# Patient Record
Sex: Male | Born: 1945
Health system: Southern US, Community
[De-identification: ages and names within clinical notes are randomized; demographics above are authoritative.]

## PROBLEM LIST (undated history)

## (undated) DIAGNOSIS — D638 Anemia in other chronic diseases classified elsewhere: Secondary | ICD-10-CM

## (undated) DIAGNOSIS — I10 Essential (primary) hypertension: Secondary | ICD-10-CM

## (undated) DIAGNOSIS — K5190 Ulcerative colitis, unspecified, without complications: Secondary | ICD-10-CM

## (undated) DIAGNOSIS — Z87442 Personal history of urinary calculi: Secondary | ICD-10-CM

## (undated) DIAGNOSIS — I8290 Acute embolism and thrombosis of unspecified vein: Secondary | ICD-10-CM

## (undated) DIAGNOSIS — C189 Malignant neoplasm of colon, unspecified: Secondary | ICD-10-CM

## (undated) DIAGNOSIS — I739 Peripheral vascular disease, unspecified: Secondary | ICD-10-CM

## (undated) HISTORY — PX: TONSILLECTOMY: SUR1361

## (undated) HISTORY — PX: HERNIA REPAIR: SHX51

## (undated) HISTORY — PX: VASECTOMY: SHX75

## (undated) HISTORY — DX: Peripheral vascular disease, unspecified: I73.9

## (undated) HISTORY — PX: UMBILICAL HERNIA REPAIR: SHX196

---

## 2013-01-05 DIAGNOSIS — D638 Anemia in other chronic diseases classified elsewhere: Secondary | ICD-10-CM

## 2013-01-05 DIAGNOSIS — I8290 Acute embolism and thrombosis of unspecified vein: Secondary | ICD-10-CM

## 2013-01-05 HISTORY — DX: Anemia in other chronic diseases classified elsewhere: D63.8

## 2013-01-05 HISTORY — DX: Acute embolism and thrombosis of unspecified vein: I82.90

## 2013-02-05 HISTORY — PX: COLON SURGERY: SHX602

## 2013-03-05 DIAGNOSIS — C189 Malignant neoplasm of colon, unspecified: Secondary | ICD-10-CM

## 2013-03-05 HISTORY — DX: Malignant neoplasm of colon, unspecified: C18.9

## 2013-03-17 ENCOUNTER — Other Ambulatory Visit: Payer: Self-pay | Admitting: Gastroenterology

## 2013-03-17 DIAGNOSIS — R195 Other fecal abnormalities: Secondary | ICD-10-CM

## 2013-03-17 DIAGNOSIS — R197 Diarrhea, unspecified: Secondary | ICD-10-CM

## 2013-03-17 DIAGNOSIS — R933 Abnormal findings on diagnostic imaging of other parts of digestive tract: Secondary | ICD-10-CM

## 2013-03-20 ENCOUNTER — Other Ambulatory Visit: Payer: Self-pay | Admitting: Gastroenterology

## 2013-03-20 ENCOUNTER — Ambulatory Visit
Admission: RE | Admit: 2013-03-20 | Discharge: 2013-03-20 | Disposition: A | Discharge status: Home / Self Care | Payer: BC Managed Care – PPO | Source: Ambulatory Visit | Attending: Gastroenterology | Admitting: Gastroenterology

## 2013-03-20 ENCOUNTER — Inpatient Hospital Stay
Admission: RE | Admit: 2013-03-20 | Discharge: 2013-03-20 | Disposition: A | Discharge status: Home / Self Care | Payer: Self-pay | Source: Ambulatory Visit | Attending: Gastroenterology | Admitting: Gastroenterology

## 2013-03-20 DIAGNOSIS — R197 Diarrhea, unspecified: Secondary | ICD-10-CM

## 2013-03-20 DIAGNOSIS — R933 Abnormal findings on diagnostic imaging of other parts of digestive tract: Secondary | ICD-10-CM

## 2013-03-20 MED ORDER — IOHEXOL 300 MG/ML  SOLN
100.0000 mL | Freq: Once | INTRAMUSCULAR | Status: AC | PRN
  Administered 2013-03-20: 100 mL via INTRAVENOUS

## 2013-03-23 ENCOUNTER — Other Ambulatory Visit (INDEPENDENT_AMBULATORY_CARE_PROVIDER_SITE_OTHER): Payer: Self-pay | Admitting: Surgery

## 2013-03-23 ENCOUNTER — Ambulatory Visit (INDEPENDENT_AMBULATORY_CARE_PROVIDER_SITE_OTHER): Payer: BC Managed Care – PPO | Admitting: Surgery

## 2013-03-23 ENCOUNTER — Encounter (INDEPENDENT_AMBULATORY_CARE_PROVIDER_SITE_OTHER): Payer: Self-pay | Admitting: Surgery

## 2013-03-23 ENCOUNTER — Ambulatory Visit (INDEPENDENT_AMBULATORY_CARE_PROVIDER_SITE_OTHER): Payer: Self-pay | Admitting: General Surgery

## 2013-03-23 VITALS — BP 142/82 | HR 75 | Temp 98.4°F | Resp 16 | Ht 70.0 in | Wt 175.0 lb

## 2013-03-23 DIAGNOSIS — C189 Malignant neoplasm of colon, unspecified: Secondary | ICD-10-CM

## 2013-03-23 DIAGNOSIS — C184 Malignant neoplasm of transverse colon: Secondary | ICD-10-CM | POA: Insufficient documentation

## 2013-03-23 DIAGNOSIS — K5190 Ulcerative colitis, unspecified, without complications: Secondary | ICD-10-CM | POA: Insufficient documentation

## 2013-03-23 DIAGNOSIS — K429 Umbilical hernia without obstruction or gangrene: Secondary | ICD-10-CM | POA: Insufficient documentation

## 2013-03-23 DIAGNOSIS — K4090 Unilateral inguinal hernia, without obstruction or gangrene, not specified as recurrent: Secondary | ICD-10-CM

## 2013-03-23 NOTE — Progress Notes (Signed)
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 male referred by Dr. Meriel Pica has a history of diarrhea going back 20 years at which time Dr. Lenda Kelp performed and a flex sig and diagnosed ulcerative colitis. Recently he's had diarrhea and was found to have heme positive stools by Dr. Florina Ou. This led to a colonoscopy by Dr. Collene Mares which shows a stricture at 50 cm and some evidence of colitis. A CT scan was performed which showed an exophytic colon mass at the junction of the transverse and descending colon which corresponds to the area biopsied by Dr. Collene Mares which showed adenocarcinoma. There were gallstones noted  Bi lateral kidney stones and a small right inguinal hernia also noted.  His wife works at an equal end[employer] and his daughter-in-law came with him. I explained the procedure to them in detail outlining a laparoscopically assisted transverse colectomy with probable descending colectomy and right colon to sigmoid colon anastomosis. They're aware that there may need to be a temporary colostomy. We'll certainly try to avoid.  Past Medical History  Diagnosis Date  . Diarrhea     Past Surgical History  Procedure Laterality Date  . Hernia repair      Current Outpatient Prescriptions  Medication Sig Dispense Refill  . diphenoxylate-atropine (LOMOTIL) 2.5-0.025 MG per tablet       . hydrochlorothiazide (MICROZIDE) 12.5 MG capsule       . PEG 3350-KCl-NaBcb-NaCl-NaSulf (PEG-3350/ELECTROLYTES) 236 G SOLR       . VIAGRA 50 MG tablet        No current facility-administered medications for this visit.   Review of patient's allergies indicates no known allergies. Family History  Problem Relation Age of Onset  . Heart disease Mother    Social History:   reports that he has never smoked. He does not have any smokeless tobacco history on file. He reports that he drinks alcohol. He reports that he  does not use illicit drugs.   REVIEW OF SYSTEMS - PERTINENT POSITIVES ONLY: Never a smoker. No history of DVT  Physical Exam:   Blood pressure 142/82, pulse 75, temperature 98.4 F (36.9 C), temperature source Oral, resp. rate 16, height 5' 10"  (1.778 m), weight 175 lb (79.379 kg). Body mass index is 25.11 kg/(m^2).  Gen:  WDWN white male NAD  Neurological: Alert and oriented to person, place, and time. Motor and sensory function is grossly intact  Head: Normocephalic and atraumatic.  Eyes: Conjunctivae are normal. Pupils are equal, round, and reactive to light. No scleral icterus.  Neck: Normal range of motion. Neck supple. No tracheal deviation or thyromegaly present.  Cardiovascular:  SR without murmurs or gallops.  No carotid bruits Respiratory: Effort normal.  No respiratory distress. No chest wall tenderness. Breath sounds normal.  No wheezes, rales or rhonchi.  Abdomen:  Recurrent umbilical hernia (repaired by Ronnie Derby in the 90s) GU: Musculoskeletal: Normal range of motion. Extremities are nontender. No cyanosis, edema or clubbing noted Lymphadenopathy: No cervical, preauricular, postauricular or axillary adenopathy is present Skin: Skin is warm and dry. No rash noted. No diaphoresis. No erythema. No pallor. Pscyh: Normal mood and affect. Behavior is normal. Judgment and thought content normal.   LABORATORY RESULTS: No results found for this or any previous visit (from the past 48 hour(s)).  RADIOLOGY RESULTS: No results found.  Problem List: Patient Active Problem List   Diagnosis Date Noted  .  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 Right inguinal hernia 03/23/2013  . Umbilical hernia 40/35/2481  . Ulcerative colitis, unspecified 03/23/2013    Assessment & Plan: Cancer of the splenic flexure. Plan laparoscopically assisted left hemicolectomy.    Matt B. Hassell Done, MD, [facility] [phone] beeper [phone]  03/23/2013 12:07  PM

## 2013-03-23 NOTE — Patient Instructions (Signed)
Laparoscopic Colectomy, Care After Refer to this sheet in the next few weeks. These instructions provide you with information on caring for yourself after your procedure. Your health care provider may also give you more specific instructions. Your treatment has been planned according to current medical practices, but problems sometimes occur. Call your health care provider if you have any problems or questions after your procedure. WHAT TO EXPECT AFTER THE PROCEDURE After your procedure, it is typical to have the following:  Pain in your abdomen, especially at the incision sites. You will be given pain medicine to control the pain.  Tiredness. This is a normal part of the recovery process. Your energy level will return to normal over the next several weeks.  Constipation. You may be given stool softeners to prevent this. HOME CARE INSTRUCTIONS   Only take over-the-counter or prescription medicines as directed by your health care provider.  Ask your health care provider whether you may take a shower when you go home.  Remove or change any bandages (dressings) as directed.  You may resume a normal diet and activities as directed. Eat plenty of fruits and vegetables to help prevent constipation.  Drink enough fluids to keep your urine clear or pale yellow. This also helps prevent constipation.  Take rest breaks during the day as needed.  Avoid lifting anything heavier than 25 pounds (11.3 kg) or driving for 4 weeks or until your health care provider says it is okay.  Follow up with your health care provider as directed. Ask your health care provider when to make an appointment to get your stitches or staples removed. SEEK MEDICAL CARE IF:   You have increased bleeding from the incision areas.  You have redness, swelling, or increasing pain in the wounds.  You see pus coming from a wound.  You have a fever.  You notice a foul smell coming from the wound or dressing.  Your wound is  breaking open (edges not staying together) after sutures or staples have been removed. SEEK IMMEDIATE MEDICAL CARE IF:  You develop a rash.  You have chest pain or difficulty breathing.  You have pain or swelling in your legs.  You have lightheadedness or feel faint.  Your abdomen becomes larger (distended).  You have nausea or vomiting.  You have blood in your stools. Document Released: 07/11/2004 Document Revised: 10/12/2012 Document Reviewed: 08/03/2012 [facility] Patient Information 2014 [place], [place].

## 2013-03-24 ENCOUNTER — Telehealth (INDEPENDENT_AMBULATORY_CARE_PROVIDER_SITE_OTHER): Payer: Self-pay | Admitting: General Surgery

## 2013-03-24 ENCOUNTER — Encounter (HOSPITAL_COMMUNITY): Payer: Self-pay | Admitting: Pharmacy Technician

## 2013-03-24 NOTE — Telephone Encounter (Signed)
Patient called in explaining that he is scheduled to start his preop colon cleanse on Monday 3/23 as well as his preadmission appointment at the hospital at 7:30.  He explained that there is no way he could do both of those at the same time.  I informed him that I would suggest he start the prep as soon as he gets home from his pre-admission appointment and that way it would only be putting him behind by about 30 minutes according to the bowel prep instructions.  The patient agreed with this plan on action and explained he would call back if he had any other questions.

## 2013-03-27 ENCOUNTER — Encounter (HOSPITAL_COMMUNITY): Payer: Self-pay

## 2013-03-27 ENCOUNTER — Ambulatory Visit (HOSPITAL_COMMUNITY)
Admission: RE | Admit: 2013-03-27 | Discharge: 2013-03-27 | Disposition: A | Discharge status: Home / Self Care | Payer: BC Managed Care – PPO | Source: Ambulatory Visit | Attending: Anesthesiology | Admitting: Anesthesiology

## 2013-03-27 ENCOUNTER — Telehealth (INDEPENDENT_AMBULATORY_CARE_PROVIDER_SITE_OTHER): Payer: Self-pay

## 2013-03-27 ENCOUNTER — Encounter (HOSPITAL_COMMUNITY)
Admission: RE | Admit: 2013-03-27 | Discharge: 2013-03-27 | Disposition: A | Discharge status: Home / Self Care | Payer: BC Managed Care – PPO | Source: Ambulatory Visit | Attending: Surgery | Admitting: Surgery

## 2013-03-27 DIAGNOSIS — C189 Malignant neoplasm of colon, unspecified: Secondary | ICD-10-CM

## 2013-03-27 DIAGNOSIS — Z01810 Encounter for preprocedural cardiovascular examination: Secondary | ICD-10-CM | POA: Insufficient documentation

## 2013-03-27 DIAGNOSIS — Z01812 Encounter for preprocedural laboratory examination: Secondary | ICD-10-CM

## 2013-03-27 DIAGNOSIS — Z01818 Encounter for other preprocedural examination: Secondary | ICD-10-CM | POA: Insufficient documentation

## 2013-03-27 HISTORY — DX: Personal history of urinary calculi: Z87.442

## 2013-03-27 HISTORY — DX: Ulcerative colitis, unspecified, without complications: K51.90

## 2013-03-27 HISTORY — DX: Essential (primary) hypertension: I10

## 2013-03-27 LAB — CBC WITH DIFFERENTIAL/PLATELET
Basophils Absolute: 0.2 10*3/uL — ABNORMAL HIGH (ref 0.0–0.1)
Basophils Relative: 2 % — ABNORMAL HIGH (ref 0–1)
EOS ABS: 0.2 10*3/uL (ref 0.0–0.7)
Eosinophils Relative: 2 % (ref 0–5)
HCT: 31.1 % — ABNORMAL LOW (ref 39.0–52.0)
Hemoglobin: 9.3 g/dL — ABNORMAL LOW (ref 13.0–17.0)
Lymphocytes Relative: 11 % — ABNORMAL LOW (ref 12–46)
Lymphs Abs: 1.1 10*3/uL (ref 0.7–4.0)
MCH: 19 pg — AB (ref 26.0–34.0)
MCHC: 29.9 g/dL — AB (ref 30.0–36.0)
MCV: 63.5 fL — ABNORMAL LOW (ref 78.0–100.0)
MONO ABS: 0.9 10*3/uL (ref 0.1–1.0)
Monocytes Relative: 9 % (ref 3–12)
Neutro Abs: 7.6 10*3/uL (ref 1.7–7.7)
Neutrophils Relative %: 76 % (ref 43–77)
PLATELETS: 579 10*3/uL — AB (ref 150–400)
RBC: 4.9 MIL/uL (ref 4.22–5.81)
RDW: 16.9 % — ABNORMAL HIGH (ref 11.5–15.5)
WBC: 10 10*3/uL (ref 4.0–10.5)

## 2013-03-27 LAB — COMPREHENSIVE METABOLIC PANEL
ALBUMIN: 3.5 g/dL (ref 3.5–5.2)
ALT: 13 U/L (ref 0–53)
AST: 17 U/L (ref 0–37)
Alkaline Phosphatase: 85 U/L (ref 39–117)
BUN: 12 mg/dL (ref 6–23)
CALCIUM: 9.4 mg/dL (ref 8.4–10.5)
CO2: 27 mEq/L (ref 19–32)
CREATININE: 1.1 mg/dL (ref 0.50–1.35)
Chloride: 95 mEq/L — ABNORMAL LOW (ref 96–112)
GFR calc Af Amer: 78 mL/min — ABNORMAL LOW (ref >90)
GFR calc non Af Amer: 67 mL/min — ABNORMAL LOW (ref >90)
Glucose, Bld: 104 mg/dL — ABNORMAL HIGH (ref 70–99)
Potassium: 3.8 mEq/L (ref 3.7–5.3)
Sodium: 136 mEq/L — ABNORMAL LOW (ref 137–147)
Total Bilirubin: 0.3 mg/dL (ref 0.3–1.2)
Total Protein: 7.8 g/dL (ref 6.0–8.3)

## 2013-03-27 LAB — ABO/RH: ABO/RH(D): B NEG

## 2013-03-27 LAB — CEA: CEA: 4.7 ng/mL (ref 0.0–5.0)

## 2013-03-27 NOTE — Pre-Procedure Instructions (Addendum)
03-27-13 EKG / CXR done today. 03-27-13 1215 Note per Epic to Dr. Hassell Done- labs viewable in Epic-please note. W. Floy Sabina

## 2013-03-27 NOTE — Progress Notes (Signed)
[id number] Labs viewable in Gem, please note CBC/d, CMP, CEA pending.

## 2013-03-27 NOTE — Patient Instructions (Addendum)
20 SIMS LADAY  03/27/2013   Your procedure is scheduled on: 3-24  -2015  Report to [facility] at        0700 AM.  Call this number if you have problems the morning of surgery: [phone]  Or Presurgical Testing [phone](Saraiyah Hemminger) For Living Will and/or Health Care Power Attorney Forms: please provide copy for your medical record,may bring AM of surgery(Forms should be already notarized -we do not provide this service).  Remember: Follow any bowel prep instructions per MD office.    Do not eat food:After Midnight.    Take these medicines the morning of surgery with A SIP OF WATER: none   Do not wear jewelry, make-up or nail polish.  Do not wear lotions, powders, or perfumes. You may wear deodorant.  Do not shave 48 hours(2 days) prior to first CHG shower(legs and under arms).(Shaving face and neck okay.)  Do not bring valuables to the hospital.(Hospital is not responsible for lost valuables).  Contacts, dentures or removable bridgework, body piercing, hair pins may not be worn into surgery.  Leave suitcase in the car. After surgery it may be brought to your room.  For patients admitted to the hospital, checkout time is 11:00 AM the day of discharge.(Restricted visitors-Any Persons displaying flu-like symptoms or illness).    Patients discharged the day of surgery will not be allowed to drive home. Must have responsible person with you x 24 hours once discharged.  Name and phone number of your driver: Dawsyn Ramsaran -spouse [phone] cell  Special Instructions: CHG(Chlorhedine 4%-"Hibiclens","Betasept","Aplicare") Shower Use Special Wash: see special instructions.(avoid face and genitals)   Please read over the following fact sheets that you were given: Blood Transfusion fact sheet, Incentive Spirometry Instruction.  Remember : Type/Screen "Blue armbands" - may not be removed once applied(would result in being retested AM of surgery, if removed).  Failure to  follow these instructions may result in Cancellation of your surgery.   Patient signature_______________________________________________________

## 2013-03-27 NOTE — Telephone Encounter (Signed)
Patient states he can not get the antibiotics down "they get hung in my throat". Advised patient to crush the ABT and put in 4oz fluids . He was to advise the pre op nurse in the am what he was able to consume. Patient verbalized understands

## 2013-03-28 ENCOUNTER — Encounter (HOSPITAL_COMMUNITY): Payer: Self-pay | Admitting: *Deleted

## 2013-03-28 ENCOUNTER — Inpatient Hospital Stay (HOSPITAL_COMMUNITY): Payer: BC Managed Care – PPO | Admitting: Anesthesiology

## 2013-03-28 ENCOUNTER — Encounter (HOSPITAL_COMMUNITY)
Admission: RE | Disposition: A | Discharge status: Home / Self Care | Payer: Self-pay | Source: Ambulatory Visit | Attending: Surgery

## 2013-03-28 ENCOUNTER — Inpatient Hospital Stay (HOSPITAL_COMMUNITY)
Admission: RE | Admit: 2013-03-28 | Discharge: 2013-04-04 | DRG: 330 | Disposition: A | Discharge status: Home / Self Care | Payer: BC Managed Care – PPO | Source: Ambulatory Visit | Attending: Surgery | Admitting: Surgery

## 2013-03-28 ENCOUNTER — Encounter (HOSPITAL_COMMUNITY): Payer: BC Managed Care – PPO | Admitting: Anesthesiology

## 2013-03-28 DIAGNOSIS — C189 Malignant neoplasm of colon, unspecified: Secondary | ICD-10-CM

## 2013-03-28 DIAGNOSIS — C185 Malignant neoplasm of splenic flexure: Principal | ICD-10-CM | POA: Diagnosis present

## 2013-03-28 DIAGNOSIS — K429 Umbilical hernia without obstruction or gangrene: Secondary | ICD-10-CM | POA: Diagnosis present

## 2013-03-28 DIAGNOSIS — D649 Anemia, unspecified: Secondary | ICD-10-CM | POA: Diagnosis present

## 2013-03-28 DIAGNOSIS — K5190 Ulcerative colitis, unspecified, without complications: Secondary | ICD-10-CM | POA: Diagnosis present

## 2013-03-28 DIAGNOSIS — Z8249 Family history of ischemic heart disease and other diseases of the circulatory system: Secondary | ICD-10-CM

## 2013-03-28 DIAGNOSIS — C184 Malignant neoplasm of transverse colon: Secondary | ICD-10-CM | POA: Diagnosis present

## 2013-03-28 HISTORY — PX: LAPAROSCOPIC RIGHT HEMI COLECTOMY: SHX5926

## 2013-03-28 LAB — TYPE AND SCREEN
ABO/RH(D): B NEG
ANTIBODY SCREEN: NEGATIVE

## 2013-03-28 LAB — CBC
HEMATOCRIT: 28 % — AB (ref 39.0–52.0)
Hemoglobin: 8.4 g/dL — ABNORMAL LOW (ref 13.0–17.0)
MCH: 19 pg — ABNORMAL LOW (ref 26.0–34.0)
MCHC: 30 g/dL (ref 30.0–36.0)
MCV: 63.5 fL — AB (ref 78.0–100.0)
Platelets: 476 10*3/uL — ABNORMAL HIGH (ref 150–400)
RBC: 4.41 MIL/uL (ref 4.22–5.81)
RDW: 16.9 % — AB (ref 11.5–15.5)
WBC: 16.6 10*3/uL — ABNORMAL HIGH (ref 4.0–10.5)

## 2013-03-28 LAB — CREATININE, SERUM
Creatinine, Ser: 1.03 mg/dL (ref 0.50–1.35)
GFR calc non Af Amer: 73 mL/min — ABNORMAL LOW (ref >90)
GFR, EST AFRICAN AMERICAN: 84 mL/min — AB (ref >90)

## 2013-03-28 SURGERY — LAPAROSCOPIC RIGHT HEMI COLECTOMY
Anesthesia: General | Site: Abdomen

## 2013-03-28 MED ORDER — DEXTROSE 5 % IV SOLN
2.0000 g | Freq: Two times a day (BID) | INTRAVENOUS | Status: AC
  Administered 2013-03-28: 2 g via INTRAVENOUS
  Filled 2013-03-28: qty 2

## 2013-03-28 MED ORDER — PROMETHAZINE HCL 25 MG/ML IJ SOLN
INTRAMUSCULAR | Status: AC
  Filled 2013-03-28: qty 1

## 2013-03-28 MED ORDER — LIP MEDEX EX OINT
TOPICAL_OINTMENT | CUTANEOUS | Status: AC
  Administered 2013-03-28: 1
  Filled 2013-03-28: qty 7

## 2013-03-28 MED ORDER — SODIUM CHLORIDE 0.9 % IR SOLN
Status: DC | PRN
  Administered 2013-03-28: 1000 mL

## 2013-03-28 MED ORDER — ROCURONIUM BROMIDE 100 MG/10ML IV SOLN
INTRAVENOUS | Status: AC
  Filled 2013-03-28: qty 1

## 2013-03-28 MED ORDER — PROMETHAZINE HCL 25 MG/ML IJ SOLN: 6.2500 mg | INTRAMUSCULAR | Status: DC | PRN

## 2013-03-28 MED ORDER — PEG 3350-KCL-NA BICARB-NACL 420 G PO SOLR: 4000.0000 mL | Freq: Once | ORAL | Status: DC

## 2013-03-28 MED ORDER — DIPHENHYDRAMINE HCL 12.5 MG/5ML PO ELIX
12.5000 mg | ORAL_SOLUTION | Freq: Four times a day (QID) | ORAL | Status: DC | PRN
  Administered 2013-04-03: 12.5 mg via ORAL
  Filled 2013-03-28: qty 5

## 2013-03-28 MED ORDER — HYDROMORPHONE HCL PF 2 MG/ML IJ SOLN
INTRAMUSCULAR | Status: AC
  Filled 2013-03-28: qty 1

## 2013-03-28 MED ORDER — KCL IN DEXTROSE-NACL 20-5-0.45 MEQ/L-%-% IV SOLN
INTRAVENOUS | Status: DC
  Administered 2013-03-28: 100 mL/h via INTRAVENOUS
  Administered 2013-03-30 – 2013-04-01 (×3): via INTRAVENOUS
  Filled 2013-03-28 (×12): qty 1000

## 2013-03-28 MED ORDER — HYDROMORPHONE HCL PF 1 MG/ML IJ SOLN
INTRAMUSCULAR | Status: AC
  Filled 2013-03-28: qty 1

## 2013-03-28 MED ORDER — ERYTHROMYCIN BASE 250 MG PO TABS: 1000.0000 mg | ORAL_TABLET | ORAL | Status: DC

## 2013-03-28 MED ORDER — GLYCOPYRROLATE 0.2 MG/ML IJ SOLN
INTRAMUSCULAR | Status: DC | PRN
  Administered 2013-03-28: 0.6 mg via INTRAVENOUS

## 2013-03-28 MED ORDER — LACTATED RINGERS IV SOLN
INTRAVENOUS | Status: DC
  Administered 2013-03-28: 1000 mL via INTRAVENOUS

## 2013-03-28 MED ORDER — NEOMYCIN SULFATE 500 MG PO TABS
1000.0000 mg | ORAL_TABLET | ORAL | Status: DC
Start: 2013-03-28 — End: 2013-03-28

## 2013-03-28 MED ORDER — FENTANYL CITRATE 0.05 MG/ML IJ SOLN
INTRAMUSCULAR | Status: DC | PRN
  Administered 2013-03-28 (×2): 50 ug via INTRAVENOUS
  Administered 2013-03-28: 100 ug via INTRAVENOUS
  Administered 2013-03-28: 50 ug via INTRAVENOUS

## 2013-03-28 MED ORDER — HEPARIN SODIUM (PORCINE) 5000 UNIT/ML IJ SOLN
5000.0000 [IU] | Freq: Three times a day (TID) | INTRAMUSCULAR | Status: DC
  Administered 2013-03-28 – 2013-04-04 (×20): 5000 [IU] via SUBCUTANEOUS
  Filled 2013-03-28 (×23): qty 1

## 2013-03-28 MED ORDER — HYDROCODONE-ACETAMINOPHEN 5-325 MG PO TABS
1.0000 | ORAL_TABLET | ORAL | Status: DC | PRN
  Administered 2013-03-28 – 2013-04-01 (×3): 2 via ORAL
  Filled 2013-03-28 (×5): qty 2

## 2013-03-28 MED ORDER — LACTATED RINGERS IV SOLN
INTRAVENOUS | Status: DC | PRN
  Administered 2013-03-28 (×2): via INTRAVENOUS

## 2013-03-28 MED ORDER — NEOSTIGMINE METHYLSULFATE 1 MG/ML IJ SOLN
INTRAMUSCULAR | Status: AC
  Filled 2013-03-28: qty 10

## 2013-03-28 MED ORDER — MIDAZOLAM HCL 2 MG/2ML IJ SOLN
INTRAMUSCULAR | Status: AC
  Filled 2013-03-28: qty 2

## 2013-03-28 MED ORDER — HYDROMORPHONE HCL PF 1 MG/ML IJ SOLN
INTRAMUSCULAR | Status: DC | PRN
  Administered 2013-03-28: 0.5 mg via INTRAVENOUS

## 2013-03-28 MED ORDER — HEPARIN SODIUM (PORCINE) 5000 UNIT/ML IJ SOLN
5000.0000 [IU] | Freq: Once | INTRAMUSCULAR | Status: AC
  Administered 2013-03-28: 5000 [IU] via SUBCUTANEOUS
  Filled 2013-03-28: qty 1

## 2013-03-28 MED ORDER — LIDOCAINE HCL (CARDIAC) 20 MG/ML IV SOLN
INTRAVENOUS | Status: AC
  Filled 2013-03-28: qty 5

## 2013-03-28 MED ORDER — PROPOFOL 10 MG/ML IV BOLUS
INTRAVENOUS | Status: AC
  Filled 2013-03-28: qty 20

## 2013-03-28 MED ORDER — MORPHINE SULFATE 2 MG/ML IJ SOLN
1.0000 mg | INTRAMUSCULAR | Status: DC | PRN
  Administered 2013-03-28 – 2013-03-29 (×5): 1 mg via INTRAVENOUS
  Filled 2013-03-28 (×7): qty 1

## 2013-03-28 MED ORDER — ONDANSETRON HCL 4 MG/2ML IJ SOLN
INTRAMUSCULAR | Status: DC | PRN
  Administered 2013-03-28: 4 mg via INTRAVENOUS

## 2013-03-28 MED ORDER — HYDROMORPHONE HCL PF 1 MG/ML IJ SOLN
0.2500 mg | INTRAMUSCULAR | Status: DC | PRN
  Administered 2013-03-28 (×4): 0.25 mg via INTRAVENOUS
  Administered 2013-03-28: 0.5 mg via INTRAVENOUS
  Administered 2013-03-28 (×2): 0.25 mg via INTRAVENOUS

## 2013-03-28 MED ORDER — GLYCOPYRROLATE 0.2 MG/ML IJ SOLN
INTRAMUSCULAR | Status: AC
  Filled 2013-03-28: qty 3

## 2013-03-28 MED ORDER — CEFOTETAN DISODIUM 2 G IJ SOLR
2.0000 g | INTRAMUSCULAR | Status: AC
  Administered 2013-03-28: 2 g via INTRAVENOUS
  Filled 2013-03-28: qty 2

## 2013-03-28 MED ORDER — ONDANSETRON HCL 4 MG/2ML IJ SOLN
4.0000 mg | Freq: Four times a day (QID) | INTRAMUSCULAR | Status: DC | PRN
  Administered 2013-03-29 – 2013-04-03 (×3): 4 mg via INTRAVENOUS
  Filled 2013-03-28 (×4): qty 2

## 2013-03-28 MED ORDER — PROPOFOL 10 MG/ML IV BOLUS
INTRAVENOUS | Status: DC | PRN
  Administered 2013-03-28: 160 mg via INTRAVENOUS

## 2013-03-28 MED ORDER — ONDANSETRON HCL 4 MG/2ML IJ SOLN
INTRAMUSCULAR | Status: AC
  Filled 2013-03-28: qty 2

## 2013-03-28 MED ORDER — ROCURONIUM BROMIDE 100 MG/10ML IV SOLN
INTRAVENOUS | Status: DC | PRN
  Administered 2013-03-28: 60 mg via INTRAVENOUS
  Administered 2013-03-28 (×2): 10 mg via INTRAVENOUS

## 2013-03-28 MED ORDER — NEOSTIGMINE METHYLSULFATE 1 MG/ML IJ SOLN
INTRAMUSCULAR | Status: DC | PRN
  Administered 2013-03-28: 4 mg via INTRAVENOUS

## 2013-03-28 MED ORDER — LACTATED RINGERS IR SOLN
Status: DC | PRN
  Administered 2013-03-28: 1000 mL

## 2013-03-28 MED ORDER — FENTANYL CITRATE 0.05 MG/ML IJ SOLN
INTRAMUSCULAR | Status: AC
Start: 2013-03-28 — End: 2013-03-28
  Filled 2013-03-28: qty 5

## 2013-03-28 MED ORDER — DIPHENHYDRAMINE HCL 50 MG/ML IJ SOLN
12.5000 mg | Freq: Four times a day (QID) | INTRAMUSCULAR | Status: DC | PRN
  Administered 2013-04-02: 12.5 mg via INTRAVENOUS
  Filled 2013-03-28: qty 1

## 2013-03-28 MED ORDER — ONDANSETRON HCL 4 MG PO TABS: 4.0000 mg | ORAL_TABLET | Freq: Four times a day (QID) | ORAL | Status: DC | PRN

## 2013-03-28 MED ORDER — MIDAZOLAM HCL 5 MG/5ML IJ SOLN
INTRAMUSCULAR | Status: DC | PRN
  Administered 2013-03-28: 2 mg via INTRAVENOUS

## 2013-03-28 SURGICAL SUPPLY — 72 items
APPLIER CLIP 5 13 M/L LIGAMAX5 (MISCELLANEOUS)
APPLIER CLIP ROT 10 11.4 M/L (STAPLE)
BLADE EXTENDED COATED 6.5IN (ELECTRODE) IMPLANT
BLADE HEX COATED 2.75 (ELECTRODE) ×6 IMPLANT
BLADE SURG SZ10 CARB STEEL (BLADE) ×3 IMPLANT
CABLE HIGH FREQUENCY MONO STRZ (ELECTRODE) ×3 IMPLANT
CANISTER SUCTION 2500CC (MISCELLANEOUS) IMPLANT
CELLS DAT CNTRL 66122 CELL SVR (MISCELLANEOUS) IMPLANT
CLIP APPLIE 5 13 M/L LIGAMAX5 (MISCELLANEOUS) IMPLANT
CLIP APPLIE ROT 10 11.4 M/L (STAPLE) IMPLANT
COUNTER NEEDLE 20 DBL MAG RED (NEEDLE) IMPLANT
COVER MAYO STAND STRL (DRAPES) ×6 IMPLANT
DECANTER SPIKE VIAL GLASS SM (MISCELLANEOUS) ×3 IMPLANT
DRAIN CHANNEL 19F RND (DRAIN) IMPLANT
DRAPE LAPAROSCOPIC ABDOMINAL (DRAPES) ×3 IMPLANT
DRAPE LG THREE QUARTER DISP (DRAPES) ×3 IMPLANT
DRAPE WARM FLUID 44X44 (DRAPE) ×3 IMPLANT
DRSG OPSITE POSTOP 4X10 (GAUZE/BANDAGES/DRESSINGS) IMPLANT
DRSG OPSITE POSTOP 4X6 (GAUZE/BANDAGES/DRESSINGS) IMPLANT
DRSG OPSITE POSTOP 4X8 (GAUZE/BANDAGES/DRESSINGS) IMPLANT
ELECT REM PT RETURN 9FT ADLT (ELECTROSURGICAL) ×3
ELECTRODE REM PT RTRN 9FT ADLT (ELECTROSURGICAL) ×1 IMPLANT
GLOVE BIO SURGEON STRL SZ 6.5 (GLOVE) ×6 IMPLANT
GLOVE BIO SURGEONS STRL SZ 6.5 (GLOVE) ×3
GLOVE BIOGEL M 8.0 STRL (GLOVE) ×6 IMPLANT
GLOVE BIOGEL PI IND STRL 7.0 (GLOVE) ×4 IMPLANT
GLOVE BIOGEL PI INDICATOR 7.0 (GLOVE) ×8
GLOVE SURG SS PI 7.0 STRL IVOR (GLOVE) ×9 IMPLANT
GOWN STRL REUS W/TWL XL LVL3 (GOWN DISPOSABLE) ×30 IMPLANT
KIT BASIN OR (CUSTOM PROCEDURE TRAY) ×3 IMPLANT
LEGGING LITHOTOMY PAIR STRL (DRAPES) ×3 IMPLANT
LIGASURE IMPACT 36 18CM CVD LR (INSTRUMENTS) ×3 IMPLANT
PACK GENERAL/GYN (CUSTOM PROCEDURE TRAY) ×3 IMPLANT
PENCIL BUTTON HOLSTER BLD 10FT (ELECTRODE) ×6 IMPLANT
RELOAD PROXIMATE 75MM BLUE (ENDOMECHANICALS) ×9 IMPLANT
RTRCTR WOUND ALEXIS 18CM MED (MISCELLANEOUS)
SCISSORS LAP 5X45 EPIX DISP (ENDOMECHANICALS) ×3 IMPLANT
SET IRRIG TUBING LAPAROSCOPIC (IRRIGATION / IRRIGATOR) ×3 IMPLANT
SHEARS CURVED HARMONIC AC 45CM (MISCELLANEOUS) ×3 IMPLANT
SLEEVE XCEL OPT CAN 5 100 (ENDOMECHANICALS) ×6 IMPLANT
SOLUTION ANTI FOG 6CC (MISCELLANEOUS) ×3 IMPLANT
SPONGE GAUZE 4X4 12PLY (GAUZE/BANDAGES/DRESSINGS) ×3 IMPLANT
SPONGE LAP 18X18 X RAY DECT (DISPOSABLE) ×6 IMPLANT
STAPLER PROXIMATE 75MM BLUE (STAPLE) ×3 IMPLANT
STAPLER VISISTAT 35W (STAPLE) ×3 IMPLANT
SUCTION POOLE TIP (SUCTIONS) ×3 IMPLANT
SUT NOVA NAB GS-21 1 T12 (SUTURE) ×12 IMPLANT
SUT PDS AB 1 CTX 36 (SUTURE) IMPLANT
SUT PDS AB 1 TP1 96 (SUTURE) IMPLANT
SUT PROLENE 2 0 KS (SUTURE) IMPLANT
SUT SILK 2 0 (SUTURE) ×2
SUT SILK 2 0 SH CR/8 (SUTURE) ×3 IMPLANT
SUT SILK 2-0 18XBRD TIE 12 (SUTURE) ×1 IMPLANT
SUT SILK 3 0 (SUTURE) ×2
SUT SILK 3 0 SH CR/8 (SUTURE) ×3 IMPLANT
SUT SILK 3-0 18XBRD TIE 12 (SUTURE) ×1 IMPLANT
SUT VIC AB 2-0 SH 18 (SUTURE) ×3 IMPLANT
SUT VICRYL 2 0 18  UND BR (SUTURE)
SUT VICRYL 2 0 18 UND BR (SUTURE) IMPLANT
SYR 20CC LL (SYRINGE) ×3 IMPLANT
SYR BULB IRRIGATION 50ML (SYRINGE) ×3 IMPLANT
SYS LAPSCP GELPORT 120MM (MISCELLANEOUS)
SYSTEM LAPSCP GELPORT 120MM (MISCELLANEOUS) IMPLANT
TOWEL OR 17X26 10 PK STRL BLUE (TOWEL DISPOSABLE) ×6 IMPLANT
TOWEL OR NON WOVEN STRL DISP B (DISPOSABLE) ×6 IMPLANT
TRAY FOLEY CATH 14FRSI W/METER (CATHETERS) ×3 IMPLANT
TRAY LAP CHOLE (CUSTOM PROCEDURE TRAY) ×3 IMPLANT
TROCAR BLADELESS OPT 5 100 (ENDOMECHANICALS) ×6 IMPLANT
TROCAR XCEL BLUNT TIP 100MML (ENDOMECHANICALS) IMPLANT
TROCAR XCEL NON-BLD 11X100MML (ENDOMECHANICALS) IMPLANT
TUBING FILTER THERMOFLATOR (ELECTROSURGICAL) ×3 IMPLANT
YANKAUER SUCT BULB TIP 10FT TU (MISCELLANEOUS) ×3 IMPLANT

## 2013-03-28 NOTE — Brief Op Note (Signed)
03/28/2013  2:20 PM  PATIENT:  Cody Suarez  69 y.o. male  PRE-OPERATIVE DIAGNOSIS:  colon cancer  POST-OPERATIVE DIAGNOSIS:  colon cancer  PROCEDURE:  Procedure(s): LAPAROSCOPIC ASSISTED HEMI COLECTOMY (N/A)  SURGEON:  Surgeon(s) and Role:    * Pedro Earls, MD - Primary    * Edward Jolly, MD - Assisting  PHYSICIAN ASSISTANT:   ASSISTANTS: Adonis Housekeeper, MD, FACS   ANESTHESIA:   general  EBL:  Total I/O In: 2400 [I.V.:2400] Out: 200 [Urine:100; Blood:100]  BLOOD ADMINISTERED:none  DRAINS: none   LOCAL MEDICATIONS USED:  NONE  SPECIMEN:  Source of Specimen:  left colon  DISPOSITION OF SPECIMEN:  PATHOLOGY  COUNTS:  YES  TOURNIQUET:  * No tourniquets in log *  DICTATION: .Other Dictation: Dictation Number [id number]  PLAN OF CARE: Admit to inpatient   PATIENT DISPOSITION:  PACU - hemodynamically stable.   Delay start of Pharmacological VTE agent (>24hrs) due to surgical blood loss or risk of bleeding: no

## 2013-03-28 NOTE — H&P (View-Only) (Signed)
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 male referred by Dr. Meriel Suarez has a history of diarrhea going back 20 years at which time Dr. Lenda Suarez performed and a flex sig and diagnosed ulcerative colitis. Recently he's had diarrhea and was found to have heme positive stools by Dr. Florina Suarez. This led to a colonoscopy by Dr. Collene Suarez which shows a stricture at 50 cm and some evidence of colitis. A CT scan was performed which showed an exophytic colon mass at the junction of the transverse and descending colon which corresponds to the area biopsied by Dr. Collene Suarez which showed adenocarcinoma. There were gallstones noted  Bi lateral kidney stones and a small right inguinal hernia also noted.  His wife works at an equal endoscopy and his daughter-in-law came with him. I explained the procedure to them in detail outlining a laparoscopically assisted transverse colectomy with probable descending colectomy and right colon to sigmoid colon anastomosis. They're aware that there may need to be a temporary colostomy. We'll certainly try to avoid.  Past Medical History  Diagnosis Date  . Diarrhea     Past Surgical History  Procedure Laterality Date  . Hernia repair      Current Outpatient Prescriptions  Medication Sig Dispense Refill  . diphenoxylate-atropine (LOMOTIL) 2.5-0.025 MG per tablet       . hydrochlorothiazide (MICROZIDE) 12.5 MG capsule       . PEG 3350-KCl-NaBcb-NaCl-NaSulf (PEG-3350/ELECTROLYTES) 236 G SOLR       . VIAGRA 50 MG tablet        No current facility-administered medications for this visit.   Review of patient's allergies indicates no known allergies. Family History  Problem Relation Age of Onset  . Heart disease Mother    Social History:   reports that he has never smoked. He does not have any smokeless tobacco history on file. He reports that he drinks alcohol. He reports that he  does not use illicit drugs.   REVIEW OF SYSTEMS - PERTINENT POSITIVES ONLY: Never a smoker. No history of DVT  Physical Exam:   Blood pressure 142/82, pulse 75, temperature 98.4 F (36.9 C), temperature source Oral, resp. rate 16, height 5' 10"  (1.778 m), weight 175 lb (79.379 kg). Body mass index is 25.11 kg/(m^2).  Gen:  WDWN white male NAD  Neurological: Alert and oriented to person, place, and time. Motor and sensory function is grossly intact  Head: Normocephalic and atraumatic.  Eyes: Conjunctivae are normal. Pupils are equal, round, and reactive to light. No scleral icterus.  Neck: Normal range of motion. Neck supple. No tracheal deviation or thyromegaly present.  Cardiovascular:  SR without murmurs or gallops.  No carotid bruits Respiratory: Effort normal.  No respiratory distress. No chest wall tenderness. Breath sounds normal.  No wheezes, rales or rhonchi.  Abdomen:  Recurrent umbilical hernia (repaired by Cody Suarez in the 90s) GU: Musculoskeletal: Normal range of motion. Extremities are nontender. No cyanosis, edema or clubbing noted Lymphadenopathy: No cervical, preauricular, postauricular or axillary adenopathy is present Skin: Skin is warm and dry. No rash noted. No diaphoresis. No erythema. No pallor. Pscyh: Normal mood and affect. Behavior is normal. Judgment and thought content normal.   LABORATORY RESULTS: No results found for this or any previous visit (from the past 48 hour(s)).  RADIOLOGY RESULTS: No results found.  Problem List: Patient Active Problem List   Diagnosis Date Noted  .  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 Right inguinal hernia 03/23/2013  . Umbilical hernia 07/01/9483  . Ulcerative colitis, unspecified 03/23/2013    Assessment & Plan: Cancer of the splenic flexure. Plan laparoscopically assisted left hemicolectomy.    Cody B. Hassell Done, MD, [facility] [phone] beeper [phone]  03/23/2013 12:07  PM

## 2013-03-28 NOTE — Transfer of Care (Signed)
Immediate Anesthesia Transfer of Care Note  Patient: Cody Suarez  Procedure(s) Performed: Procedure(s): LAPAROSCOPIC ASSISTED HEMI COLECTOMY (N/A)  Patient Location: PACU  Anesthesia Type:MAC and General  Level of Consciousness: awake, alert  and oriented  Airway & Oxygen Therapy: Patient Spontanous Breathing and Patient connected to face mask oxygen  Post-op Assessment: Report given to PACU RN and Post -op Vital signs reviewed and stable  Post vital signs: Reviewed and stable  Complications: No apparent anesthesia complications

## 2013-03-28 NOTE — Preoperative (Signed)
Beta Blockers   Reason not to administer Beta Blockers:Not Applicable 

## 2013-03-28 NOTE — Progress Notes (Signed)
Patient admitted to [facility] transferred from PACU, alert and oriented, transferred on hospital bed, patient tolerated well, wife and family at bedside, BP=142/80, P=77, Temp=99.1 (O), Sats=97% on 2L/[place], Mid-Abd incision with honey-comb dsg, dry and intact, 2- sites at this LLQ and LUQ of the abdomen, dry and intact with scant amount of bloody drainage, pain level 5/10, foley cath draining clear yellow urine, skin dry and intact, pupils reactive to light pen, patient in stable condition at this time

## 2013-03-28 NOTE — Interval H&P Note (Signed)
History and Physical Interval Note:  03/28/2013 10:25 AM  Cody Suarez  has presented today for surgery, with the diagnosis of colon cancer  The various methods of treatment have been discussed with the patient and family. After consideration of risks, benefits and other options for treatment, the patient has consented to  Procedure(s): LAPAROSCOPIC ASSISTED HEMI COLECTOMY (N/A) as a surgical intervention .  The patient's history has been reviewed, patient examined, no change in status, stable for surgery.  I have reviewed the patient's chart and labs.  Questions were answered to the patient's satisfaction.     Babara Buffalo B

## 2013-03-28 NOTE — Anesthesia Preprocedure Evaluation (Addendum)
Anesthesia Evaluation  Patient identified by MRN, date of birth, ID band Patient awake    Reviewed: Allergy & Precautions, H&P , NPO status , Patient's Chart, lab work & pertinent test results  Airway Mallampati: II TM Distance: >3 FB Neck ROM: Full    Dental  (+) Edentulous Upper   Pulmonary neg pulmonary ROS,  breath sounds clear to auscultation  Pulmonary exam normal       Cardiovascular Exercise Tolerance: Good hypertension, Pt. on medications Rhythm:Regular Rate:Normal     Neuro/Psych negative neurological ROS  negative psych ROS   GI/Hepatic Neg liver ROS, PUD,   Endo/Other  negative endocrine ROS  Renal/GU negative Renal ROS  negative genitourinary   Musculoskeletal negative musculoskeletal ROS (+)   Abdominal   Peds negative pediatric ROS (+)  Hematology negative hematology ROS (+)   Anesthesia Other Findings   Reproductive/Obstetrics negative OB ROS                         Anesthesia Physical Anesthesia Plan  ASA: II  Anesthesia Plan: General   Post-op Pain Management:    Induction: Intravenous  Airway Management Planned: Oral ETT  Additional Equipment:   Intra-op Plan:   Post-operative Plan: Extubation in OR  Informed Consent: I have reviewed the patients History and Physical, chart, labs and discussed the procedure including the risks, benefits and alternatives for the proposed anesthesia with the patient or authorized representative who has indicated his/her understanding and acceptance.   Dental advisory given  Plan Discussed with: CRNA  Anesthesia Plan Comments:         Anesthesia Quick Evaluation

## 2013-03-28 NOTE — Anesthesia Postprocedure Evaluation (Signed)
  Anesthesia Post-op Note  Patient: Cody Suarez  Procedure(s) Performed: Procedure(s) (LRB): LAPAROSCOPIC ASSISTED HEMI COLECTOMY (N/A)  Patient Location: PACU  Anesthesia Type: General  Level of Consciousness: awake and alert   Airway and Oxygen Therapy: Patient Spontanous Breathing  Post-op Pain: mild  Post-op Assessment: Post-op Vital signs reviewed, Patient's Cardiovascular Status Stable, Respiratory Function Stable, Patent Airway and No signs of Nausea or vomiting  Last Vitals:  Filed Vitals:   03/28/13 1553  BP: 154/74  Pulse: 101  Temp: 36.8 C  Resp: 12    Post-op Vital Signs: stable   Complications: No apparent anesthesia complications

## 2013-03-29 ENCOUNTER — Encounter (INDEPENDENT_AMBULATORY_CARE_PROVIDER_SITE_OTHER): Payer: Self-pay

## 2013-03-29 ENCOUNTER — Encounter (HOSPITAL_COMMUNITY): Payer: Self-pay | Admitting: Surgery

## 2013-03-29 LAB — BASIC METABOLIC PANEL
BUN: 8 mg/dL (ref 6–23)
CHLORIDE: 100 meq/L (ref 96–112)
CO2: 25 meq/L (ref 19–32)
Calcium: 7.7 mg/dL — ABNORMAL LOW (ref 8.4–10.5)
Creatinine, Ser: 1.01 mg/dL (ref 0.50–1.35)
GFR calc Af Amer: 86 mL/min — ABNORMAL LOW (ref >90)
GFR calc non Af Amer: 74 mL/min — ABNORMAL LOW (ref >90)
Glucose, Bld: 147 mg/dL — ABNORMAL HIGH (ref 70–99)
Potassium: 3.6 mEq/L — ABNORMAL LOW (ref 3.7–5.3)
Sodium: 137 mEq/L (ref 137–147)

## 2013-03-29 LAB — CBC
HEMATOCRIT: 26.4 % — AB (ref 39.0–52.0)
HEMOGLOBIN: 7.7 g/dL — AB (ref 13.0–17.0)
MCH: 18.7 pg — ABNORMAL LOW (ref 26.0–34.0)
MCHC: 29.2 g/dL — ABNORMAL LOW (ref 30.0–36.0)
MCV: 64.2 fL — AB (ref 78.0–100.0)
Platelets: 373 10*3/uL (ref 150–400)
RBC: 4.11 MIL/uL — AB (ref 4.22–5.81)
RDW: 17 % — ABNORMAL HIGH (ref 11.5–15.5)
WBC: 13.7 10*3/uL — AB (ref 4.0–10.5)

## 2013-03-29 MED ORDER — DIPHENHYDRAMINE HCL 50 MG/ML IJ SOLN: 12.5000 mg | Freq: Four times a day (QID) | INTRAMUSCULAR | Status: DC | PRN

## 2013-03-29 MED ORDER — SODIUM CHLORIDE 0.9 % IJ SOLN: 9.0000 mL | INTRAMUSCULAR | Status: DC | PRN

## 2013-03-29 MED ORDER — MORPHINE SULFATE (PF) 1 MG/ML IV SOLN
INTRAVENOUS | Status: DC
  Administered 2013-03-29: 4 mg via INTRAVENOUS
  Administered 2013-03-29: 25 mg via INTRAVENOUS
  Administered 2013-03-29: 14 mg via INTRAVENOUS
  Administered 2013-03-29: 2 mg via INTRAVENOUS
  Administered 2013-03-30: 1 mg via INTRAVENOUS
  Administered 2013-03-30: 3 mg via INTRAVENOUS
  Administered 2013-03-30 (×2): 2 mg via INTRAVENOUS
  Administered 2013-03-30: 3 mg via INTRAVENOUS
  Administered 2013-03-30: 2 mg via INTRAVENOUS
  Administered 2013-03-31: 3 mg via INTRAVENOUS
  Administered 2013-03-31: 4 mg via INTRAVENOUS
  Administered 2013-03-31: 22:00:00 via INTRAVENOUS
  Administered 2013-03-31: 2 mg via INTRAVENOUS
  Administered 2013-03-31 (×2): 4 mg via INTRAVENOUS
  Administered 2013-04-01: 1 mg via INTRAVENOUS
  Administered 2013-04-01: 2 mg via INTRAVENOUS
  Filled 2013-03-29 (×3): qty 25

## 2013-03-29 MED ORDER — DIPHENHYDRAMINE HCL 12.5 MG/5ML PO ELIX: 12.5000 mg | ORAL_SOLUTION | Freq: Four times a day (QID) | ORAL | Status: DC | PRN

## 2013-03-29 MED ORDER — NALOXONE HCL 0.4 MG/ML IJ SOLN: 0.4000 mg | INTRAMUSCULAR | Status: DC | PRN

## 2013-03-29 MED ORDER — ONDANSETRON HCL 4 MG/2ML IJ SOLN
4.0000 mg | Freq: Four times a day (QID) | INTRAMUSCULAR | Status: DC | PRN
  Administered 2013-03-31 (×2): 4 mg via INTRAVENOUS
  Filled 2013-03-29 (×2): qty 2

## 2013-03-29 NOTE — Progress Notes (Signed)
Patient's foley was d/c'd at 1630 and patient has yet to void. A bladder scan was performed and the patient was found to have 253 mL in his bladder. MD on call paged, waiting for return call. Will continue to monitor patient.

## 2013-03-29 NOTE — Progress Notes (Signed)
Patient ID: Cody Suarez, male   DOB: [birth year], 68 y.o.   MRN: [id number] [facility] Progress Note:   1 Day Post-Op  Subjective: Mental status is clear.  Pain is not adequately controlled.   Objective: Vital signs in last 24 hours: Temp:  [97.5 F (36.4 C)-99.5 F (37.5 C)] 98.4 F (36.9 C) (03/25 0600) Pulse Rate:  [77-103] 86 (03/25 0600) Resp:  [11-20] 18 (03/25 0600) BP: (142-168)/(66-80) 162/68 mmHg (03/25 0600) SpO2:  [96 %-100 %] 100 % (03/25 0600) FiO2 (%):  [2 %] 2 % (03/24 1600) Weight:  [176 lb 9.4 oz (80.1 kg)] 176 lb 9.4 oz (80.1 kg) (03/24 1553)  Intake/Output from previous day: 03/24 0701 - 03/25 0700 In: 2600 [I.V.:2600] Out: 1100 [Urine:1000; Blood:100] Intake/Output this shift:    Physical Exam: Work of breathing is normal.  Incision covered.    Lab Results:  Results for orders placed during the hospital encounter of 03/28/13 (from the past 48 hour(s))  CBC     Status: Abnormal   Collection Time    03/28/13  4:20 PM      Result Value Ref Range   WBC 16.6 (*) 4.0 - 10.5 K/uL   RBC 4.41  4.22 - 5.81 MIL/uL   Hemoglobin 8.4 (*) 13.0 - 17.0 g/dL   HCT 28.0 (*) 39.0 - 52.0 %   MCV 63.5 (*) 78.0 - 100.0 fL   MCH 19.0 (*) 26.0 - 34.0 pg   MCHC 30.0  30.0 - 36.0 g/dL   RDW 16.9 (*) 11.5 - 15.5 %   Platelets 476 (*) 150 - 400 K/uL  CREATININE, SERUM     Status: Abnormal   Collection Time    03/28/13  4:20 PM      Result Value Ref Range   Creatinine, Ser 1.03  0.50 - 1.35 mg/dL   GFR calc non Af Amer 73 (*) >90 mL/min   GFR calc Af Amer 84 (*) >90 mL/min   Comment: (NOTE)     The eGFR has been calculated using the CKD EPI equation.     This calculation has not been validated in all clinical situations.     eGFR's persistently <90 mL/min signify possible Chronic Kidney     Disease.  BASIC METABOLIC PANEL     Status: Abnormal   Collection Time    03/29/13  4:43 AM      Result Value Ref Range   Sodium 137  137 - 147 mEq/L   Potassium 3.6  (*) 3.7 - 5.3 mEq/L   Chloride 100  96 - 112 mEq/L   CO2 25  19 - 32 mEq/L   Glucose, Bld 147 (*) 70 - 99 mg/dL   BUN 8  6 - 23 mg/dL   Creatinine, Ser 1.01  0.50 - 1.35 mg/dL   Calcium 7.7 (*) 8.4 - 10.5 mg/dL   GFR calc non Af Amer 74 (*) >90 mL/min   GFR calc Af Amer 86 (*) >90 mL/min   Comment: (NOTE)     The eGFR has been calculated using the CKD EPI equation.     This calculation has not been validated in all clinical situations.     eGFR's persistently <90 mL/min signify possible Chronic Kidney     Disease.  CBC     Status: Abnormal   Collection Time    03/29/13  4:43 AM      Result Value Ref Range   WBC 13.7 (*) 4.0 - 10.5 K/uL  RBC 4.11 (*) 4.22 - 5.81 MIL/uL   Hemoglobin 7.7 (*) 13.0 - 17.0 g/dL   HCT 26.4 (*) 39.0 - 52.0 %   MCV 64.2 (*) 78.0 - 100.0 fL   MCH 18.7 (*) 26.0 - 34.0 pg   MCHC 29.2 (*) 30.0 - 36.0 g/dL   RDW 17.0 (*) 11.5 - 15.5 %   Platelets 373  150 - 400 K/uL    Radiology/Results: Dg Chest 2 View  03/27/2013   CLINICAL DATA:  Preop colon cancer  EXAM: CHEST  2 VIEW  COMPARISON:  None.  FINDINGS: The heart size and mediastinal contours are within normal limits. Both lungs are clear. The visualized skeletal structures are unremarkable.  IMPRESSION: No active cardiopulmonary disease.   Electronically Signed   By: Franchot Gallo M.D.   On: 03/27/2013 09:35    Anti-infectives: Anti-infectives   Start     Dose/Rate Route Frequency Ordered Stop   03/28/13 2200  cefoTEtan (CEFOTAN) 2 g in dextrose 5 % 50 mL IVPB     2 g 100 mL/hr over 30 Minutes Intravenous Every 12 hours 03/28/13 1604 03/28/13 2230   03/28/13 0653  neomycin (MYCIFRADIN) tablet 1,000 mg  Status:  Discontinued     1,000 mg Oral 3 times per day 03/28/13 0653 03/28/13 0655   03/28/13 0653  erythromycin (E-MYCIN) tablet 1,000 mg  Status:  Discontinued     1,000 mg Oral 3 times per day 03/28/13 0653 03/28/13 0655   03/28/13 0652  cefoTEtan (CEFOTAN) 2 g in dextrose 5 % 50 mL IVPB     2  g 100 mL/hr over 30 Minutes Intravenous On call to O.R. 03/28/13 0653 03/28/13 1042      Assessment/Plan: Problem List: Patient Active Problem List   Diagnosis Date Noted  . 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 Right inguinal hernia 03/23/2013  . Umbilical hernia 21/30/8657  . Ulcerative colitis, unspecified 03/23/2013    Will add PCA morphine low dose protocol.   Keep NPO 1 Day Post-Op    LOS: 1 day   Matt B. Hassell Done, MD, [facility] [phone] beeper [phone]  03/29/2013 8:02 AM

## 2013-03-30 LAB — CBC
HEMATOCRIT: 24.5 % — AB (ref 39.0–52.0)
HEMOGLOBIN: 7 g/dL — AB (ref 13.0–17.0)
MCH: 18.5 pg — AB (ref 26.0–34.0)
MCHC: 28.6 g/dL — AB (ref 30.0–36.0)
MCV: 64.8 fL — AB (ref 78.0–100.0)
Platelets: 369 10*3/uL (ref 150–400)
RBC: 3.78 MIL/uL — ABNORMAL LOW (ref 4.22–5.81)
RDW: 17.2 % — ABNORMAL HIGH (ref 11.5–15.5)
WBC: 12.3 10*3/uL — ABNORMAL HIGH (ref 4.0–10.5)

## 2013-03-30 MED ORDER — CALCIUM CARBONATE ANTACID 500 MG PO CHEW: 400.0000 mg | CHEWABLE_TABLET | ORAL | Status: DC | PRN

## 2013-03-30 MED ORDER — CALCIUM CARBONATE ANTACID 500 MG PO CHEW
1000.0000 mg | CHEWABLE_TABLET | ORAL | Status: DC | PRN
  Administered 2013-03-30: 1000 mg via ORAL
  Filled 2013-03-30: qty 2

## 2013-03-30 NOTE — Progress Notes (Signed)
Patient ID: Cody Suarez, male   DOB: 12/05/1945, 68 y.o.   MRN: [id number] [facility] Progress Note:   2 Days Post-Op  Subjective: Mental status is clear.  Voided last night and didn't require catherization Objective: Vital signs in last 24 hours: Temp:  [97.9 F (36.6 C)-99.5 F (37.5 C)] 98.3 F (36.8 C) (03/26 0200) Pulse Rate:  [86-105] 97 (03/26 0200) Resp:  [12-20] 16 (03/26 0333) BP: (133-162)/(59-68) 147/64 mmHg (03/26 0200) SpO2:  [90 %-100 %] 99 % (03/26 0333) FiO2 (%):  [30 %] 30 % (03/26 0333)  Intake/Output from previous day: 03/25 0701 - 03/26 0700 In: 0  Out: 675 [Urine:675] Intake/Output this shift: Total I/O In: -  Out: 475 [Urine:475]  Physical Exam: Work of breathing is normal.  Abdomen is not tender;  Reports passage of some flatus  Lab Results:  Results for orders placed during the hospital encounter of 03/28/13 (from the past 48 hour(s))  CBC     Status: Abnormal   Collection Time    03/28/13  4:20 PM      Result Value Ref Range   WBC 16.6 (*) 4.0 - 10.5 K/uL   RBC 4.41  4.22 - 5.81 MIL/uL   Hemoglobin 8.4 (*) 13.0 - 17.0 g/dL   HCT 28.0 (*) 39.0 - 52.0 %   MCV 63.5 (*) 78.0 - 100.0 fL   MCH 19.0 (*) 26.0 - 34.0 pg   MCHC 30.0  30.0 - 36.0 g/dL   RDW 16.9 (*) 11.5 - 15.5 %   Platelets 476 (*) 150 - 400 K/uL  CREATININE, SERUM     Status: Abnormal   Collection Time    03/28/13  4:20 PM      Result Value Ref Range   Creatinine, Ser 1.03  0.50 - 1.35 mg/dL   GFR calc non Af Amer 73 (*) >90 mL/min   GFR calc Af Amer 84 (*) >90 mL/min   Comment: (NOTE)     The eGFR has been calculated using the CKD EPI equation.     This calculation has not been validated in all clinical situations.     eGFR's persistently <90 mL/min signify possible Chronic Kidney     Disease.  BASIC METABOLIC PANEL     Status: Abnormal   Collection Time    03/29/13  4:43 AM      Result Value Ref Range   Sodium 137  137 - 147 mEq/L   Potassium 3.6 (*) 3.7 -  5.3 mEq/L   Chloride 100  96 - 112 mEq/L   CO2 25  19 - 32 mEq/L   Glucose, Bld 147 (*) 70 - 99 mg/dL   BUN 8  6 - 23 mg/dL   Creatinine, Ser 1.01  0.50 - 1.35 mg/dL   Calcium 7.7 (*) 8.4 - 10.5 mg/dL   GFR calc non Af Amer 74 (*) >90 mL/min   GFR calc Af Amer 86 (*) >90 mL/min   Comment: (NOTE)     The eGFR has been calculated using the CKD EPI equation.     This calculation has not been validated in all clinical situations.     eGFR's persistently <90 mL/min signify possible Chronic Kidney     Disease.  CBC     Status: Abnormal   Collection Time    03/29/13  4:43 AM      Result Value Ref Range   WBC 13.7 (*) 4.0 - 10.5 K/uL   RBC 4.11 (*) 4.22 -  5.81 MIL/uL   Hemoglobin 7.7 (*) 13.0 - 17.0 g/dL   HCT 26.4 (*) 39.0 - 52.0 %   MCV 64.2 (*) 78.0 - 100.0 fL   MCH 18.7 (*) 26.0 - 34.0 pg   MCHC 29.2 (*) 30.0 - 36.0 g/dL   RDW 17.0 (*) 11.5 - 15.5 %   Platelets 373  150 - 400 K/uL    Radiology/Results: No results found.  Anti-infectives: Anti-infectives   Start     Dose/Rate Route Frequency Ordered Stop   03/28/13 2200  cefoTEtan (CEFOTAN) 2 g in dextrose 5 % 50 mL IVPB     2 g 100 mL/hr over 30 Minutes Intravenous Every 12 hours 03/28/13 1604 03/28/13 2230   03/28/13 0653  neomycin (MYCIFRADIN) tablet 1,000 mg  Status:  Discontinued     1,000 mg Oral 3 times per day 03/28/13 0653 03/28/13 0655   03/28/13 0653  erythromycin (E-MYCIN) tablet 1,000 mg  Status:  Discontinued     1,000 mg Oral 3 times per day 03/28/13 0653 03/28/13 0655   03/28/13 0652  cefoTEtan (CEFOTAN) 2 g in dextrose 5 % 50 mL IVPB     2 g 100 mL/hr over 30 Minutes Intravenous On call to O.R. 03/28/13 0653 03/28/13 1042      Assessment/Plan: Problem List: Patient Active Problem List   Diagnosis Date Noted  . 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 Right inguinal hernia 03/23/2013  . Umbilical hernia 95/97/4718  . Ulcerative colitis, unspecified 03/23/2013    Path pending on left colon cancer.   Will offer clear liquids.  2 Days Post-Op    LOS: 2 days   Matt B. Hassell Done, MD, [facility] [phone] beeper [phone]  03/30/2013 4:49 AM

## 2013-03-30 NOTE — Progress Notes (Signed)
Patient is complaining of indigestion.  He reports that he takes Tums at night. CCS on call paged. Awaiting a response.

## 2013-03-30 NOTE — Progress Notes (Signed)
Dr. Harlow Asa responded back and ordered Tums for indigestion.

## 2013-03-31 LAB — CBC
HCT: 23.6 % — ABNORMAL LOW (ref 39.0–52.0)
Hemoglobin: 7 g/dL — ABNORMAL LOW (ref 13.0–17.0)
MCH: 18.8 pg — ABNORMAL LOW (ref 26.0–34.0)
MCHC: 29.7 g/dL — ABNORMAL LOW (ref 30.0–36.0)
MCV: 63.4 fL — ABNORMAL LOW (ref 78.0–100.0)
PLATELETS: 386 10*3/uL (ref 150–400)
RBC: 3.72 MIL/uL — AB (ref 4.22–5.81)
RDW: 17 % — ABNORMAL HIGH (ref 11.5–15.5)
WBC: 10.6 10*3/uL — AB (ref 4.0–10.5)

## 2013-03-31 NOTE — Progress Notes (Signed)
Patient ID: Cody Suarez, male   DOB: [birth year], 68 y.o.   MRN: [id number] 3 Days Post-Op  Subjective: Some nausea when he tries clear liquids. No vomiting. Small amount of flatus but no bowel movements.  Good pain control. No other complaints. Has been walking in the halls.   Objective: Vital signs in last 24 hours: Temp:  [98.4 F (36.9 C)-99 F (37.2 C)] 98.9 F (37.2 C) (03/27 0555) Pulse Rate:  [77-94] 83 (03/27 0555) Resp:  [13-19] 19 (03/27 0833) BP: (137-157)/(62-70) 151/68 mmHg (03/27 0555) SpO2:  [94 %-99 %] 99 % (03/27 0833) FiO2 (%):  [33 %] 33 % (03/27 0833) Last BM Date:  (PTA)  Intake/Output from previous day: 03/26 0701 - 03/27 0700 In: 1260 [P.O.:460; I.V.:800] Out: 1175 [Urine:1175] Intake/Output this shift:    General appearance: alert, cooperative and no distress Resp: clear to auscultation bilaterally GI: normal findings: soft, non-tender and minimal distention with a few high-pitched bowel sounds Incision/Wound: clean and dry without erythema  Lab Results:   Recent Labs  03/30/13 0453 03/31/13 0420  WBC 12.3* 10.6*  HGB 7.0* 7.0*  HCT 24.5* 23.6*  PLT 369 386   BMET  Recent Labs  03/28/13 1620 03/29/13 0443  NA  --  137  K  --  3.6*  CL  --  100  CO2  --  25  GLUCOSE  --  147*  BUN  --  8  CREATININE 1.03 1.01  CALCIUM  --  7.7*     Studies/Results: No results found.  Anti-infectives: Anti-infectives   Start     Dose/Rate Route Frequency Ordered Stop   03/28/13 2200  cefoTEtan (CEFOTAN) 2 g in dextrose 5 % 50 mL IVPB     2 g 100 mL/hr over 30 Minutes Intravenous Every 12 hours 03/28/13 1604 03/28/13 2230   03/28/13 0653  neomycin (MYCIFRADIN) tablet 1,000 mg  Status:  Discontinued     1,000 mg Oral 3 times per day 03/28/13 0653 03/28/13 0655   03/28/13 0653  erythromycin (E-MYCIN) tablet 1,000 mg  Status:  Discontinued     1,000 mg Oral 3 times per day 03/28/13 0653 03/28/13 0655   03/28/13 0652  cefoTEtan (CEFOTAN) 2 g  in dextrose 5 % 50 mL IVPB     2 g 100 mL/hr over 30 Minutes Intravenous On call to O.R. 03/28/13 0653 03/28/13 1042      Assessment/Plan: s/p Procedure(s): LAPAROSCOPIC ASSISTED HEMI COLECTOMY Doing well postoperatively without apparent complication. Probably some degree of mild ileus. Continue clear liquids only. Ambulation encouraged.   LOS: 3 days    Sloane Palmer T 03/31/2013

## 2013-03-31 NOTE — Op Note (Signed)
NAME:  Cody Suarez, Cody Suarez NO.:  [id number]  MEDICAL RECORD NO.:  [id number]  LOCATION:                                 FACILITY:  PHYSICIAN:  Isabel Caprice. 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  SURGEON:  Isabel Caprice. Hassell Done, MD  ASSISTANT:  Darene Lamer. Hoxworth, M.D.  PROCEDURE:  Laparoscopically-assisted left colectomy with primary proximal transverse to sigmoid colotomy.  DESCRIPTION OF PROCEDURE:  The patient was taken to room 11 and given general anesthesia.  The abdomen was prepped with PCO max, actually was with technique with chlorhexidine and draped sterilely.  Access to the abdomen was achieved through the left upper quadrant after a time-out was performed.  Three other 5 mm trocars were used.  I used a laparoscope to mobilize the left colon, went up the left sidewall, taken down from the line of Toldt and mobilizing the splenic flexure.  The tumor itself, however, was invasive and appeared to be involving the lesser sac more and it was difficult laparoscopically and I needed to be able to feel this.  I made an upper midline incision going through previous old hernia repair.  The ligature was used to finish the mobilization of the splenic flexure, and this was brought into the midline.  I was able to take this out and I went ahead and went to the proximal transverse dividing the colon, went down to the mesentery and I took this off down near the ligament of Treitz, and then went down, removing the left colon down to the proximal sigmoid colon.  This was mobilized along with the transverse colon proximally and I was able to bring these down in a side-to-side  fashion and perform a stapled side-to- side anastomosis.  The common defect was closed in 2 layers with 3-0 PDS on the inside in a canal Mayo fashion and an outside of Lembert sutures of interrupted 3-0 silks.  The anastomosis was buttressed with a crotch stitch and omentum was laid over it as well.  The area was irrigated. We did not use a wound protector.  We then changed her gown and gloves and changed out the equipment before closing.  We inspect everything and looked to be viable.  Good healthy anastomosis on both sides with a good healthy blood supply.  The midline incision was then closed with interrupted #1 Novafil's and staples.  The patient tolerated the procedure well and was taken to the recovery room in satisfactory condition.    Isabel Caprice Hassell Done, MD    MBM/MEDQ  D:  03/28/2013  T:  03/28/2013  Job:  [id number]

## 2013-04-01 LAB — CBC
HEMATOCRIT: 23.2 % — AB (ref 39.0–52.0)
HEMOGLOBIN: 6.8 g/dL — AB (ref 13.0–17.0)
MCH: 18.8 pg — ABNORMAL LOW (ref 26.0–34.0)
MCHC: 29.3 g/dL — AB (ref 30.0–36.0)
MCV: 64.1 fL — ABNORMAL LOW (ref 78.0–100.0)
Platelets: 392 10*3/uL (ref 150–400)
RBC: 3.62 MIL/uL — AB (ref 4.22–5.81)
RDW: 16.9 % — ABNORMAL HIGH (ref 11.5–15.5)
WBC: 9.2 10*3/uL (ref 4.0–10.5)

## 2013-04-01 NOTE — Progress Notes (Signed)
4 Days Post-Op  Subjective: Had loose BM Nausea improved  Objective: Vital signs in last 24 hours: Temp:  [98.2 F (36.8 C)-99.2 F (37.3 C)] 98.2 F (36.8 C) (03/28 0551) Pulse Rate:  [72-88] 72 (03/28 0551) Resp:  [16-21] 16 (03/28 0551) BP: (130-156)/(58-72) 130/58 mmHg (03/28 0551) SpO2:  [96 %-99 %] 98 % (03/28 0551) FiO2 (%):  [34 %-38 %] 35 % (03/28 0818) Last BM Date:  (PTA)  Intake/Output from previous day: 03/27 0701 - 03/28 0700 In: 2300 [I.V.:2300] Out: 200 [Urine:200] Intake/Output this shift: Total I/O In: -  Out: 375 [Urine:375]  Abdomen soft, non distended, dressing dry  Lab Results:   Recent Labs  03/31/13 0420 04/01/13 0510  WBC 10.6* 9.2  HGB 7.0* 6.8*  HCT 23.6* 23.2*  PLT 386 392   BMET No results found for this basename: NA, K, CL, CO2, GLUCOSE, BUN, CREATININE, CALCIUM,  in the last 72 hours PT/INR No results found for this basename: LABPROT, INR,  in the last 72 hours ABG No results found for this basename: PHART, PCO2, PO2, HCO3,  in the last 72 hours  Studies/Results: No results found.  Anti-infectives: Anti-infectives   Start     Dose/Rate Route Frequency Ordered Stop   03/28/13 2200  cefoTEtan (CEFOTAN) 2 g in dextrose 5 % 50 mL IVPB     2 g 100 mL/hr over 30 Minutes Intravenous Every 12 hours 03/28/13 1604 03/28/13 2230   03/28/13 0653  neomycin (MYCIFRADIN) tablet 1,000 mg  Status:  Discontinued     1,000 mg Oral 3 times per day 03/28/13 0653 03/28/13 0655   03/28/13 0653  erythromycin (E-MYCIN) tablet 1,000 mg  Status:  Discontinued     1,000 mg Oral 3 times per day 03/28/13 0653 03/28/13 0655   03/28/13 0652  cefoTEtan (CEFOTAN) 2 g in dextrose 5 % 50 mL IVPB     2 g 100 mL/hr over 30 Minutes Intravenous On call to O.R. 03/28/13 0653 03/28/13 1042      Assessment/Plan: s/p Procedure(s): LAPAROSCOPIC ASSISTED HEMI COLECTOMY (N/A)  Chronic anemia, asymptomatic.  Will follow D/c PCA Full liquid diet  LOS: 4 days     Jennilee Demarco A 04/01/2013

## 2013-04-01 NOTE — Progress Notes (Signed)
CRITICAL VALUE ALERT  Critical value received:  hgb 6.8  Date of notification:  04/01/13  Time of notification:  0640  Critical value read back:yes  Nurse who received alert:  Myrtie Cruise  MD notified (1st page):  Zenaida Niece MD  Time of first page:  [phone]  MD notified (2nd page):  Time of second page:  Responding MD: Johney Maine  Time MD responded:  [phone]

## 2013-04-02 LAB — CBC
HCT: 24.4 % — ABNORMAL LOW (ref 39.0–52.0)
Hemoglobin: 7.3 g/dL — ABNORMAL LOW (ref 13.0–17.0)
MCH: 18.9 pg — ABNORMAL LOW (ref 26.0–34.0)
MCHC: 29.9 g/dL — ABNORMAL LOW (ref 30.0–36.0)
MCV: 63.2 fL — ABNORMAL LOW (ref 78.0–100.0)
Platelets: 381 10*3/uL (ref 150–400)
RBC: 3.86 MIL/uL — ABNORMAL LOW (ref 4.22–5.81)
RDW: 17.2 % — ABNORMAL HIGH (ref 11.5–15.5)
WBC: 8 10*3/uL (ref 4.0–10.5)

## 2013-04-02 MED ORDER — LACTATED RINGERS IV BOLUS (SEPSIS): 1000.0000 mL | Freq: Three times a day (TID) | INTRAVENOUS | Status: DC | PRN

## 2013-04-02 MED ORDER — LIP MEDEX EX OINT
1.0000 "application " | TOPICAL_OINTMENT | Freq: Two times a day (BID) | CUTANEOUS | Status: DC
  Administered 2013-04-02 – 2013-04-04 (×5): 1 via TOPICAL
  Filled 2013-04-02: qty 7

## 2013-04-02 MED ORDER — HYDROCHLOROTHIAZIDE 12.5 MG PO CAPS
12.5000 mg | ORAL_CAPSULE | Freq: Every morning | ORAL | Status: DC
  Administered 2013-04-02 – 2013-04-03 (×2): 12.5 mg via ORAL
  Filled 2013-04-02 (×3): qty 1

## 2013-04-02 MED ORDER — MAGIC MOUTHWASH
15.0000 mL | Freq: Four times a day (QID) | ORAL | Status: DC | PRN
  Administered 2013-04-02: 15 mL via ORAL
  Filled 2013-04-02: qty 15

## 2013-04-02 MED ORDER — WITCH HAZEL-GLYCERIN EX PADS: 1.0000 "application " | MEDICATED_PAD | CUTANEOUS | Status: DC | PRN

## 2013-04-02 MED ORDER — MENTHOL 3 MG MT LOZG: 1.0000 | LOZENGE | OROMUCOSAL | Status: DC | PRN

## 2013-04-02 MED ORDER — ACETAMINOPHEN 500 MG PO TABS
1000.0000 mg | ORAL_TABLET | Freq: Three times a day (TID) | ORAL | Status: DC
  Administered 2013-04-02 (×2): 1000 mg via ORAL
  Filled 2013-04-02 (×10): qty 2

## 2013-04-02 MED ORDER — ZOLPIDEM TARTRATE 5 MG PO TABS: 5.0000 mg | ORAL_TABLET | Freq: Every evening | ORAL | Status: DC | PRN

## 2013-04-02 MED ORDER — HYDROCORTISONE ACE-PRAMOXINE 2.5-1 % RE CREA: 1.0000 "application " | TOPICAL_CREAM | Freq: Four times a day (QID) | RECTAL | Status: DC | PRN

## 2013-04-02 MED ORDER — PROMETHAZINE HCL 25 MG/ML IJ SOLN: 6.2500 mg | Freq: Four times a day (QID) | INTRAMUSCULAR | Status: DC | PRN

## 2013-04-02 MED ORDER — MORPHINE SULFATE 2 MG/ML IJ SOLN: 1.0000 mg | INTRAMUSCULAR | Status: DC | PRN

## 2013-04-02 MED ORDER — METOPROLOL TARTRATE 1 MG/ML IV SOLN
5.0000 mg | Freq: Four times a day (QID) | INTRAVENOUS | Status: DC | PRN
  Filled 2013-04-02: qty 5

## 2013-04-02 MED ORDER — GUAIFENESIN-DM 100-10 MG/5ML PO SYRP: 15.0000 mL | ORAL_SOLUTION | ORAL | Status: DC | PRN

## 2013-04-02 MED ORDER — BISMUTH SUBSALICYLATE 262 MG/15ML PO SUSP: 30.0000 mL | Freq: Three times a day (TID) | ORAL | Status: DC | PRN

## 2013-04-02 MED ORDER — ENSURE COMPLETE PO LIQD
237.0000 mL | Freq: Three times a day (TID) | ORAL | Status: DC
  Administered 2013-04-02 (×2): 237 mL via ORAL

## 2013-04-02 MED ORDER — ALUM & MAG HYDROXIDE-SIMETH 200-200-20 MG/5ML PO SUSP: 30.0000 mL | Freq: Four times a day (QID) | ORAL | Status: DC | PRN

## 2013-04-02 MED ORDER — SACCHAROMYCES BOULARDII 250 MG PO CAPS
250.0000 mg | ORAL_CAPSULE | Freq: Two times a day (BID) | ORAL | Status: DC
  Administered 2013-04-02 – 2013-04-03 (×4): 250 mg via ORAL
  Filled 2013-04-02 (×6): qty 1

## 2013-04-02 MED ORDER — LORATADINE 5 MG/5ML PO SYRP
5.0000 mg | ORAL_SOLUTION | Freq: Every day | ORAL | Status: DC
  Administered 2013-04-02 – 2013-04-03 (×2): 5 mg via ORAL
  Filled 2013-04-02 (×3): qty 5

## 2013-04-02 MED ORDER — PHENOL 1.4 % MT LIQD: 2.0000 | OROMUCOSAL | Status: DC | PRN

## 2013-04-02 MED ORDER — SODIUM CHLORIDE 0.9 % IJ SOLN
3.0000 mL | Freq: Two times a day (BID) | INTRAMUSCULAR | Status: DC
  Administered 2013-04-02 – 2013-04-03 (×4): 3 mL via INTRAVENOUS

## 2013-04-02 MED ORDER — OXYCODONE HCL 5 MG PO TABS: 5.0000 mg | ORAL_TABLET | ORAL | Status: DC | PRN

## 2013-04-02 MED ORDER — SODIUM CHLORIDE 0.9 % IJ SOLN: 3.0000 mL | INTRAMUSCULAR | Status: DC | PRN

## 2013-04-02 NOTE — Evaluation (Signed)
Clinical/Bedside Swallow Evaluation Patient Details  Name: Cody Suarez MRN: [id number] Date of Birth: [birth year]  Today's Date: 04/02/2013 Time: 1720-1735 SLP Time Calculation (min): 15 min  Past Medical History:  Past Medical History  Diagnosis Date  . Diarrhea   . Hypertension   . History of kidney stones     x 2 episodes, none recent  . Arthritis     arthritis thumb joints  . Cancer     dx.colon cancer after colonoscopy   . Ulcerative colitis     history   Past Surgical History:  Past Surgical History  Procedure Laterality Date  . Hernia repair      umbilical hernia repair  . Tonsillectomy    . Vasectomy    . Laparoscopic right hemi colectomy N/A 03/28/2013    Procedure: LAPAROSCOPIC ASSISTED HEMI COLECTOMY;  Surgeon: Pedro Earls, MD;  Location: [facility];  Service: General;  Laterality: N/A;   HPI:  68 year old male admitted 03/28/13 for surgery due to Colon CA.    Assessment / Plan / Recommendation Clinical Impression  Oral motor structure and function adequate. Pt reported pain and globus sensation earlier today, possibly delayed response to intubation.  Pt reported RN provided meds, and that he was much improved at this time.  Pt tolerated thin liquid without overt s/s aspiration, and without globus sensation.  Pt refused trials of puree and solid, indicating he did "not want to push it". Pt currently receiving full liquids.  Rec continuing this diet, advancing as pt tolerates.  SLP will recheck for continued improvement and diet tolerance/advancement.    Aspiration Risk  Mild    Diet Recommendation  (full liquids, advance as tolerated)   Liquid Administration via: Straw;Cup Medication Administration: Whole meds with liquid Supervision: Patient able to self feed Compensations: Slow rate;Small sips/bites Postural Changes and/or Swallow Maneuvers: Seated upright 90 degrees    Other  Recommendations Oral Care Recommendations: Oral care BID   Follow Up  Recommendations  None    Frequency and Duration min 1 x/week  1 week   Pertinent Vitals/Pain VSS, no  Pain reported    SLP Swallow Goals  tolerate least restrictive diet   Swallow Study Prior Functional Status   no history of dysphagia. Regular/thin liquids prior to admit    General Date of Onset: 03/28/13 HPI: 68 year old male admitted 03/28/13 for surgery due to Colon CA.  Type of Study: Bedside swallow evaluation Previous Swallow Assessment: n/a Diet Prior to this Study:  (full liquids) Temperature Spikes Noted: No Respiratory Status: Nasal cannula History of Recent Intubation: Yes Length of Intubations (days): 1 days (during surgery 03/28/13) Date extubated: 03/28/13 Behavior/Cognition: Alert;Cooperative;Pleasant mood Oral Cavity - Dentition: Adequate natural dentition Self-Feeding Abilities: Able to feed self Patient Positioning: Upright in bed Baseline Vocal Quality: Clear Volitional Cough: Strong Volitional Swallow: Able to elicit    Oral/Motor/Sensory Function Overall Oral Motor/Sensory Function: Appears within functional limits for tasks assessed   Ice Chips Ice chips: Not tested   Thin Liquid Thin Liquid: Within functional limits Presentation: Straw    Nectar Thick Nectar Thick Liquid: Not tested   Honey Thick Honey Thick Liquid: Not tested   Puree Puree: Not tested   Solid   GO   Celia B. Quentin Ore [facility], CCC-SLP [phone]  Solid: Not tested       Shonna Chock 04/02/2013,5:36 PM

## 2013-04-02 NOTE — Progress Notes (Signed)
Paged Dr. Johney Maine about patient's c/o sore throat.  Orders received.

## 2013-04-02 NOTE — Progress Notes (Signed)
Paged Dr. Johney Maine about Patient's c/o having trouble swallowing, refusing to take anything PO until we figure out what is going on.  Dr. Johney Maine thinks its from Pt being intubated and if or until he had stridor or respiratory distress he thinks the current treatment is best.

## 2013-04-02 NOTE — Progress Notes (Signed)
5 Days Post-Op  Subjective: Still a little weak Tolerated full liquids +BM  Objective: Vital signs in last 24 hours: Temp:  [97.8 F (36.6 C)-98.2 F (36.8 C)] 98.2 F (36.8 C) (03/29 0600) Pulse Rate:  [74-90] 77 (03/29 0600) Resp:  [18] 18 (03/29 0600) BP: (123-143)/(64-69) 131/64 mmHg (03/29 0600) SpO2:  [96 %-97 %] 97 % (03/29 0600) Last BM Date:  (PTA)  Intake/Output from previous day: 03/28 0701 - 03/29 0700 In: 1300 [I.V.:1300] Out: 1175 [Urine:1175] Intake/Output this shift:    Abdomen soft, non distended  Lab Results:   Recent Labs  04/01/13 0510 04/02/13 0458  WBC 9.2 8.0  HGB 6.8* 7.3*  HCT 23.2* 24.4*  PLT 392 381   BMET No results found for this basename: NA, K, CL, CO2, GLUCOSE, BUN, CREATININE, CALCIUM,  in the last 72 hours PT/INR No results found for this basename: LABPROT, INR,  in the last 72 hours ABG No results found for this basename: PHART, PCO2, PO2, HCO3,  in the last 72 hours  Studies/Results: No results found.  Anti-infectives: Anti-infectives   Start     Dose/Rate Route Frequency Ordered Stop   03/28/13 2200  cefoTEtan (CEFOTAN) 2 g in dextrose 5 % 50 mL IVPB     2 g 100 mL/hr over 30 Minutes Intravenous Every 12 hours 03/28/13 1604 03/28/13 2230   03/28/13 0653  neomycin (MYCIFRADIN) tablet 1,000 mg  Status:  Discontinued     1,000 mg Oral 3 times per day 03/28/13 0653 03/28/13 0655   03/28/13 0653  erythromycin (E-MYCIN) tablet 1,000 mg  Status:  Discontinued     1,000 mg Oral 3 times per day 03/28/13 0653 03/28/13 0655   03/28/13 0652  cefoTEtan (CEFOTAN) 2 g in dextrose 5 % 50 mL IVPB     2 g 100 mL/hr over 30 Minutes Intravenous On call to O.R. 03/28/13 0653 03/28/13 1042      Assessment/Plan: s/p Procedure(s): LAPAROSCOPIC ASSISTED HEMI COLECTOMY (N/A)  Regular diet  LOS: 5 days    Dj Senteno A 04/02/2013

## 2013-04-03 MED ORDER — SIMETHICONE 80 MG PO CHEW
80.0000 mg | CHEWABLE_TABLET | Freq: Four times a day (QID) | ORAL | Status: DC | PRN
  Filled 2013-04-03: qty 1

## 2013-04-03 NOTE — Progress Notes (Signed)
Patient ID: Cody Suarez, male   DOB: 1945/03/09, 68 y.o.   MRN: [id number] [facility] Progress Note:   6 Days Post-Op  Subjective: Mental status is clear Objective: Vital signs in last 24 hours: Temp:  [97.7 F (36.5 C)-98.7 F (37.1 C)] 97.7 F (36.5 C) (03/30 1011) Pulse Rate:  [72-86] 86 (03/30 1011) Resp:  [16-20] 18 (03/30 1011) BP: (142-154)/(70-80) 142/76 mmHg (03/30 1011) SpO2:  [96 %-98 %] 98 % (03/30 1011)  Intake/Output from previous day: 03/29 0701 - 03/30 0700 In: -  Out: 350 [Urine:350] Intake/Output this shift: Total I/O In: 3 [I.V.:3] Out: -   Physical Exam: Work of breathing is normal.  Incision OK with bandage.    Lab Results:  Results for orders placed during the hospital encounter of 03/28/13 (from the past 48 hour(s))  CBC     Status: Abnormal   Collection Time    04/02/13  4:58 AM      Result Value Ref Range   WBC 8.0  4.0 - 10.5 K/uL   RBC 3.86 (*) 4.22 - 5.81 MIL/uL   Hemoglobin 7.3 (*) 13.0 - 17.0 g/dL   HCT 24.4 (*) 39.0 - 52.0 %   MCV 63.2 (*) 78.0 - 100.0 fL   MCH 18.9 (*) 26.0 - 34.0 pg   MCHC 29.9 (*) 30.0 - 36.0 g/dL   RDW 17.2 (*) 11.5 - 15.5 %   Platelets 381  150 - 400 K/uL    Radiology/Results: No results found.  Anti-infectives: Anti-infectives   Start     Dose/Rate Route Frequency Ordered Stop   03/28/13 2200  cefoTEtan (CEFOTAN) 2 g in dextrose 5 % 50 mL IVPB     2 g 100 mL/hr over 30 Minutes Intravenous Every 12 hours 03/28/13 1604 03/28/13 2230   03/28/13 0653  neomycin (MYCIFRADIN) tablet 1,000 mg  Status:  Discontinued     1,000 mg Oral 3 times per day 03/28/13 0653 03/28/13 0655   03/28/13 0653  erythromycin (E-MYCIN) tablet 1,000 mg  Status:  Discontinued     1,000 mg Oral 3 times per day 03/28/13 0653 03/28/13 0655   03/28/13 0652  cefoTEtan (CEFOTAN) 2 g in dextrose 5 % 50 mL IVPB     2 g 100 mL/hr over 30 Minutes Intravenous On call to O.R. 03/28/13 0653 03/28/13 1042       Assessment/Plan: Problem List: Patient Active Problem List   Diagnosis Date Noted  . 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 Right inguinal hernia 03/23/2013  . Umbilical hernia 38/45/3646  . Ulcerative colitis, unspecified 03/23/2013    Having some pain with swallowing.  Will offer mylanta.   6 Days Post-Op    LOS: 6 days   Matt B. Hassell Done, MD, [facility] [phone] beeper [phone]  04/03/2013 10:25 AM

## 2013-04-03 NOTE — Progress Notes (Signed)
Patient complains of throat hurting and does not want to take any tablets only capsules.

## 2013-04-04 MED ORDER — OXYCODONE-ACETAMINOPHEN 5-325 MG PO TABS: 1.0000 | ORAL_TABLET | ORAL | Status: DC | PRN

## 2013-04-04 NOTE — Discharge Summary (Signed)
Physician Discharge Summary  Patient ID: Cody Suarez MRN: [id number] DOB/AGE: 1945/03/25 68 y.o.  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  Had postop ileus (expected).  Diet begun and advanced.  Ready for discharge.   Consults: none  Significant Diagnostic Studies: path    Discharge Exam: Blood pressure 148/79, pulse 84, temperature 98.4 F (36.9 C), temperature source Oral, resp. rate 18, height 5' 10"  (1.778 m), weight 176 lb 9.4 oz (80.1 kg), SpO2 97.00%. Incision OK.  Staples removed prior to discharge  Disposition: Final discharge disposition not confirmed  Discharge Orders   Future Orders Complete By Expires   Diet - low sodium heart healthy  As directed    Discharge wound care:  As directed    Comments:     May shower   Increase activity slowly  As directed        Medication List    STOP taking these medications       diphenoxylate-atropine 2.5-0.025 MG per tablet  Commonly known as:  LOMOTIL      TAKE these medications       hydrochlorothiazide 12.5 MG capsule  Commonly known as:  MICROZIDE  Take 12.5 mg by mouth every morning.     oxyCODONE-acetaminophen 5-325 MG per tablet  Commonly known as:  ROXICET  Take 1 tablet by mouth every 4 (four) hours as needed for severe pain.     VIAGRA 50 MG tablet  Generic drug:  sildenafil  Take 50 mg by mouth as needed for erectile dysfunction.           Follow-up Information   Follow up with Pedro Earls, MD.   Specialty:  General Surgery   Contact information:   [address] [place] [address] [phone]       Signed: Pedro Earls 04/04/2013, 9:47 AM

## 2013-04-05 ENCOUNTER — Ambulatory Visit (INDEPENDENT_AMBULATORY_CARE_PROVIDER_SITE_OTHER): Payer: Self-pay | Admitting: Surgery

## 2013-05-03 ENCOUNTER — Ambulatory Visit (INDEPENDENT_AMBULATORY_CARE_PROVIDER_SITE_OTHER): Payer: BC Managed Care – PPO | Admitting: Surgery

## 2013-05-03 ENCOUNTER — Encounter (INDEPENDENT_AMBULATORY_CARE_PROVIDER_SITE_OTHER): Payer: Self-pay | Admitting: Surgery

## 2013-05-03 VITALS — BP 130/75 | HR 100 | Temp 96.9°F | Resp 16 | Ht 70.0 in | Wt 162.8 lb

## 2013-05-03 DIAGNOSIS — C189 Malignant neoplasm of colon, unspecified: Secondary | ICD-10-CM

## 2013-05-03 NOTE — Progress Notes (Signed)
Cody Suarez 68 y.o.  Body mass index is 23.36 kg/(m^2).  Patient Active Problem List   Diagnosis Date Noted  . 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 Right inguinal hernia 03/23/2013  . Umbilical hernia 23/30/0762  . Ulcerative colitis, unspecified 03/23/2013    No Known Allergies  Past Surgical History  Procedure Laterality Date  . Hernia repair      umbilical hernia repair  . Tonsillectomy    . Vasectomy    . Laparoscopic right hemi colectomy N/A 03/28/2013    Procedure: LAPAROSCOPIC ASSISTED HEMI COLECTOMY;  Surgeon: Pedro Earls, MD;  Location: [facility];  Service: General;  Laterality: N/A;   Florina Ou, MD No diagnosis found.  Incision has healed.  Appetite is good.  [name] has picked up his case.  Will refer to Dr. Benay Spice for discussion of chemotherapy.   Return 2 months.  In the mean time the patient will contact me about when his stamina has returned to a point where he can go back to work Cody Suarez.  Matt B. Hassell Done, MD, [facility] [phone] beeper [phone]  05/03/2013 2:45 PM

## 2013-05-04 ENCOUNTER — Other Ambulatory Visit (INDEPENDENT_AMBULATORY_CARE_PROVIDER_SITE_OTHER): Payer: Self-pay

## 2013-05-04 DIAGNOSIS — C189 Malignant neoplasm of colon, unspecified: Secondary | ICD-10-CM

## 2013-05-08 ENCOUNTER — Ambulatory Visit (INDEPENDENT_AMBULATORY_CARE_PROVIDER_SITE_OTHER): Payer: BC Managed Care – PPO | Admitting: General Surgery

## 2013-05-08 ENCOUNTER — Encounter (INDEPENDENT_AMBULATORY_CARE_PROVIDER_SITE_OTHER): Payer: Self-pay

## 2013-05-08 VITALS — BP 126/83 | HR 77 | Temp 98.2°F | Resp 18 | Ht 70.0 in | Wt 164.2 lb

## 2013-05-08 DIAGNOSIS — Z4889 Encounter for other specified surgical aftercare: Secondary | ICD-10-CM

## 2013-05-08 NOTE — Patient Instructions (Signed)
Patient came in today to have a staple removed , but it was a suture coming though the skin and I trimmed it back some, the patient has an upcoming apt with Dr Hassell Done to have the surgery site reck

## 2013-05-09 ENCOUNTER — Telehealth: Payer: Self-pay | Admitting: *Deleted

## 2013-05-09 NOTE — Telephone Encounter (Signed)
Spoke with patient and confirmed appointment with Dr. Benay Spice for 05/18/13.  Contact names, numbers, and directions were provided.

## 2013-05-18 ENCOUNTER — Telehealth: Payer: Self-pay | Admitting: *Deleted

## 2013-05-18 ENCOUNTER — Encounter: Payer: Self-pay | Admitting: Oncology

## 2013-05-18 ENCOUNTER — Ambulatory Visit (HOSPITAL_BASED_OUTPATIENT_CLINIC_OR_DEPARTMENT_OTHER): Payer: BC Managed Care – PPO | Admitting: Oncology

## 2013-05-18 ENCOUNTER — Telehealth: Payer: Self-pay | Admitting: Oncology

## 2013-05-18 ENCOUNTER — Encounter: Payer: Self-pay | Admitting: *Deleted

## 2013-05-18 ENCOUNTER — Ambulatory Visit: Payer: BC Managed Care – PPO

## 2013-05-18 ENCOUNTER — Other Ambulatory Visit: Payer: Self-pay | Admitting: *Deleted

## 2013-05-18 VITALS — BP 166/68 | HR 85 | Temp 97.0°F | Resp 18 | Ht 70.0 in | Wt 165.6 lb

## 2013-05-18 DIAGNOSIS — K5190 Ulcerative colitis, unspecified, without complications: Secondary | ICD-10-CM

## 2013-05-18 DIAGNOSIS — I10 Essential (primary) hypertension: Secondary | ICD-10-CM

## 2013-05-18 DIAGNOSIS — C184 Malignant neoplasm of transverse colon: Secondary | ICD-10-CM

## 2013-05-18 MED ORDER — CAPECITABINE 500 MG PO TABS: ORAL_TABLET | ORAL | Status: DC

## 2013-05-18 MED ORDER — FERROUS SULFATE 325 (65 FE) MG PO TBEC: 325.0000 mg | DELAYED_RELEASE_TABLET | Freq: Two times a day (BID) | ORAL | Status: DC

## 2013-05-18 NOTE — Progress Notes (Signed)
Cody Suarez Patient Consult   Referring MD: Zelma Mazariego 68 y.o.  [birth year]    Reason for Referral: Colon cancer   HPI: He reports a history of "colitis "diagnosed in the 52s. He reports presenting with diarrhea and was diagnosed with "colitis "by Dr. Oran Rein. The colitis have been in remission for many years. He reports last undergoing a colonoscopy 9 years ago.  He developed recurrent diarrhea for 2-3 weeks and saw Dr. Modena Morrow. He was referred to Dr. Collene Mares and was taken to a colonoscopy 03/17/2013. A diffuse area of ulcerated mucosa was found in the rectum up to 50 cm. Multiple biopsies were obtained. A stricture at 50 cm was biopsied. The scope could not be advanced beyond the stricture. The pathology revealed invasive adenocarcinoma from the biopsy at 50 cm. A biopsy from the distal descending colon reveal chronic active colitis.  A CT of the abdomen and pelvis on 03/20/2013 found the liver to be unremarkable. An exophytic mass was seen at the ventral wall of the colon at the junction of the transverse and descending colon it was difficult to exclude mild wall thickening of the cecum and proximal a sending colon. Small right inguinal hernia. No pathologically enlarged lymph nodes. No free fluid. A chest x-ray on 03/27/2013 revealed no active cardiopulmonary disease.  He was referred to Dr. Hassell Done and was taken to the operating room on 03/28/2013 for a laparoscopic assisted left colectomy with a primary proximal transverse to sigmoid colostomy.  The pathology ([id number]) confirmed an invasive moderately differentiated adenocarcinoma at the junction of the transverse and descending colon. Tumor invaded into the pericolonic fat and focally involved omental tissue. The resection margins were negative. No lymphovascular or perineural invasion. No macroscopic tumor perforation. 17 lymph nodes were negative for metastatic carcinoma. Background ulcerative  colitis was noted.  Past Medical History  Diagnosis Date  .  microcytic anemia   March 2015   . Hypertension   . History of kidney stones     x 2 episodes, none recent  . Arthritis     arthritis thumb joints  .  colon cancer, transverse-descending junction, stage IIC (T4 N0)   03/28/2013       . Ulcerative colitis  1980s     history    Past Surgical History  Procedure Laterality Date  . Hernia repair      umbilical hernia repair  . Tonsillectomy    . Vasectomy    . Laparoscopic right hemi colectomy N/A 03/28/2013    Procedure: LAPAROSCOPIC ASSISTED HEMI COLECTOMY;  Surgeon: Pedro Earls, MD;  Location: [facility];  Service: General;  Laterality: N/A;  .       Medications: Reviewed  Allergies: No Known Allergies  Family history: He is an only child. His paternal grandfather died of colon cancer in his 51s. 3 children. No other family history of cancer.  Social History: He works in a Product/process development scientist facility, he does not smoke cigarettes, he drinks wine occasionally, no transfusion history. No risk factor for HIV or hepatitis  History  Alcohol Use  . Yes    Comment: occ. x3 glasses week    History  Smoking status  . Never Smoker   Smokeless tobacco  . Not on file      ROS:   Positives include: Diarrhea prior to surgery, 30 pound weight loss prior to surgery A complete ROS was otherwise negative.  Physical Exam:  Blood pressure  166/68, pulse 85, temperature 97 F (36.1 C), temperature source Oral, resp. rate 18, height _0  (1.778 m), weight 165 lb 9.6 oz (75.116 kg), SpO2 100.00%.  HEENT: Upper denture plate, oropharynx without visible mass, neck without mass Lungs: Clear bilaterally Cardiac: Regular rate and rhythm Abdomen: No hepatomegaly, healed midline incision, no spinal megaly, no mass GU: Testes without mass  Vascular: No leg edema Lymph nodes: No cervical, supraclavicular, axillary, or inguinal nodes Neurologic: Alert and  oriented, the motor exam appears intact in the upper and lower extremities Skin: No rash Musculoskeletal: No spine tenderness   LAB:  CBC  Lab Results  Component Value Date   WBC 8.0 04/02/2013   HGB 7.3* 04/02/2013   HCT 24.4* 04/02/2013   MCV 63.2* 04/02/2013   PLT 381 04/02/2013   NEUTROABS 7.6 03/27/2013     CMP      Component Value Date/Time   NA 137 03/29/2013 0443   K 3.6* 03/29/2013 0443   CL 100 03/29/2013 0443   CO2 25 03/29/2013 0443   GLUCOSE 147* 03/29/2013 0443   BUN 8 03/29/2013 0443   CREATININE 1.01 03/29/2013 0443   CALCIUM 7.7* 03/29/2013 0443   PROT 7.8 03/27/2013 0900   ALBUMIN 3.5 03/27/2013 0900   AST 17 03/27/2013 0900   ALT 13 03/27/2013 0900   ALKPHOS 85 03/27/2013 0900   BILITOT 0.3 03/27/2013 0900   GFRNONAA 74* 03/29/2013 0443   GFRAA 86* 03/29/2013 0443    Lab Results  Component Value Date   CEA 4.7 03/27/2013    Imaging: As per history of present illness   Assessment/Plan:   1. 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 Ulcerative colitis-extensive chronic active ulcerative colitis was noted on the colon resection specimen 03/28/2013 3. Hypertension 4. Microcytic anemia 5.   possible area of cecal wall thickening noted on the abdomen CT 03/20/2013 6.   family history of colon cancer  Disposition:   Cody Suarez has been diagnosed with adenocarcinoma of the transverse/descending colon in the setting of ulcerative colitis. I discussed the prognosis and adjuvant treatment options with him today. We reviewed the details of the surgical pathology report. I estimated the chance of developing recurrent colon cancer over the next 5 years to be in the 25% range. We discussed the benefit associated with adjuvant 5-fluorouracil based chemotherapy. The  predicted absolute benefit of chemotherapy in his case is small. Aside from the T4 stage the tumor does not have high-risk features.  We discussed capecitabine chemotherapy. We reviewed the potential toxicities associated with capecitabine including the chance for mucositis, diarrhea, and hematologic toxicity. We discussed the rash, hyperpigmentation, and hand/foot syndrome associated with capecitabine.  Cody Suarez would like to proceed with a course of adjuvant chemotherapy. He will attend a chemotherapy teaching class. The plan is to begin a first cycle of adjuvant capecitabine during the week of 05/22/2013.  He appears to have iron deficiency anemia. He will begin ferrous sulfate. We will check a CBC and chemistry panel when he returns for the chemotherapy teaching 05/19/2013.  The tumor will be tested for microsatellite instability and mismatch repair protein expression  He plans to followup with Dr. Watt Climes for surveillance colonoscopies and management of ulcerative colitis. He will need to have a completion colonoscopy in the next 6 months.  Approximately 50 minutes were spent with  patient today. The majority of the time was used for counseling and coordination of care.  [name] 05/18/2013, 5:11 PM

## 2013-05-18 NOTE — Progress Notes (Signed)
Met with Cody Suarez. Explained role of nurse navigator. Educational information provided on colon cancer   [facility] resources provided to patient, including SW service and support group information.  Patient declined need for referral to dietician. Contact names and phone numbers were provided for entire [facility] team.  Teach back method was used.  No barriers to care identified.  Will continue to follow as needed.

## 2013-05-18 NOTE — Progress Notes (Signed)
Checked in new patient with no financial issues right now--he has not seen the dr. He has not been out of the country

## 2013-05-18 NOTE — Telephone Encounter (Signed)
Sent dr Benay Spice an email regarding if the pt can see Cody Suarez on 06/09/2013.

## 2013-05-18 NOTE — Addendum Note (Signed)
Addended by: Brien Few on: 05/18/2013 05:59 PM   Modules accepted: Orders

## 2013-05-18 NOTE — Telephone Encounter (Signed)
gv adn prnted appt sched and avs for pt for May oadn June

## 2013-05-18 NOTE — Telephone Encounter (Signed)
Request sent to pathology to perform IHC/MSI testing on Accession: [id number].

## 2013-05-19 ENCOUNTER — Encounter: Payer: Self-pay | Admitting: Oncology

## 2013-05-19 ENCOUNTER — Ambulatory Visit: Payer: BC Managed Care – PPO

## 2013-05-19 ENCOUNTER — Other Ambulatory Visit (HOSPITAL_BASED_OUTPATIENT_CLINIC_OR_DEPARTMENT_OTHER): Payer: BC Managed Care – PPO

## 2013-05-19 ENCOUNTER — Encounter: Payer: Self-pay | Admitting: *Deleted

## 2013-05-19 ENCOUNTER — Other Ambulatory Visit: Payer: Self-pay | Admitting: *Deleted

## 2013-05-19 DIAGNOSIS — C184 Malignant neoplasm of transverse colon: Secondary | ICD-10-CM

## 2013-05-19 LAB — COMPREHENSIVE METABOLIC PANEL (CC13)
ALBUMIN: 3.7 g/dL (ref 3.5–5.0)
ALT: 13 U/L (ref 0–55)
AST: 17 U/L (ref 5–34)
Alkaline Phosphatase: 92 U/L (ref 40–150)
Anion Gap: 13 mEq/L — ABNORMAL HIGH (ref 3–11)
BUN: 14.1 mg/dL (ref 7.0–26.0)
CALCIUM: 9.7 mg/dL (ref 8.4–10.4)
CHLORIDE: 105 meq/L (ref 98–109)
CO2: 23 mEq/L (ref 22–29)
Creatinine: 1.2 mg/dL (ref 0.7–1.3)
Glucose: 88 mg/dl (ref 70–140)
Potassium: 3.6 mEq/L (ref 3.5–5.1)
SODIUM: 140 meq/L (ref 136–145)
Total Bilirubin: 0.35 mg/dL (ref 0.20–1.20)
Total Protein: 8.2 g/dL (ref 6.4–8.3)

## 2013-05-19 LAB — CBC WITH DIFFERENTIAL/PLATELET
BASO%: 2.4 % — ABNORMAL HIGH (ref 0.0–2.0)
BASOS ABS: 0.2 10*3/uL — AB (ref 0.0–0.1)
EOS ABS: 0.3 10*3/uL (ref 0.0–0.5)
EOS%: 3.3 % (ref 0.0–7.0)
HCT: 30.7 % — ABNORMAL LOW (ref 38.4–49.9)
HGB: 9.2 g/dL — ABNORMAL LOW (ref 13.0–17.1)
LYMPH#: 2 10*3/uL (ref 0.9–3.3)
LYMPH%: 19.8 % (ref 14.0–49.0)
MCH: 18.6 pg — ABNORMAL LOW (ref 27.2–33.4)
MCHC: 30 g/dL — ABNORMAL LOW (ref 32.0–36.0)
MCV: 62 fL — ABNORMAL LOW (ref 79.3–98.0)
MONO#: 1.2 10*3/uL — ABNORMAL HIGH (ref 0.1–0.9)
MONO%: 11.5 % (ref 0.0–14.0)
NEUT%: 63 % (ref 39.0–75.0)
NEUTROS ABS: 6.4 10*3/uL (ref 1.5–6.5)
Platelets: 547 10*3/uL — ABNORMAL HIGH (ref 140–400)
RBC: 4.95 10*6/uL (ref 4.20–5.82)
RDW: 20.1 % — ABNORMAL HIGH (ref 11.0–14.6)
WBC: 10.1 10*3/uL (ref 4.0–10.3)

## 2013-05-19 MED ORDER — PROCHLORPERAZINE MALEATE 10 MG PO TABS: 10.0000 mg | ORAL_TABLET | Freq: Four times a day (QID) | ORAL | Status: DC | PRN

## 2013-05-19 NOTE — Progress Notes (Signed)
Informed consent signed for patient for Xeloda.

## 2013-05-19 NOTE — Telephone Encounter (Signed)
Notified by chemo education nurse that patient needs home antiemetic. Sent electronic order for Compazine.

## 2013-05-19 NOTE — Progress Notes (Signed)
Chemo class with patient today.  Patient asked many questions.  Signed informed consent for Xeloda.  Teachback done

## 2013-05-19 NOTE — Progress Notes (Addendum)
XELODA RX TO [facility]IA [facility].  05/19/13 FAXED RX TO [facility] [phone] AS [facility] COULDN'T FILL IT.  THEIR PHONE # IS [phone].  05/22/13 [facility] CALLED AND SAID HE HAD A 10.00 PER LEE.

## 2013-05-22 ENCOUNTER — Other Ambulatory Visit (HOSPITAL_COMMUNITY)
Admission: RE | Admit: 2013-05-22 | Discharge: 2013-05-22 | Disposition: A | Discharge status: Home / Self Care | Payer: BC Managed Care – PPO | Source: Ambulatory Visit | Attending: Oncology | Admitting: Oncology

## 2013-05-22 DIAGNOSIS — C184 Malignant neoplasm of transverse colon: Secondary | ICD-10-CM | POA: Insufficient documentation

## 2013-05-24 NOTE — Telephone Encounter (Signed)
Received faxed notification from [facility] that capecitabine was shipped 05/23/13 for next day delivery.

## 2013-05-25 ENCOUNTER — Telehealth: Payer: Self-pay | Admitting: *Deleted

## 2013-05-25 NOTE — Telephone Encounter (Signed)
Received message from pt re: iron pills.  Called and spoke with pt; reports "I'm having diarrhea with these iron pills"  States he had 5-6 episodes yesterday 5/20 and today has had 4 episodes so far. Eating/drinking well.  "this ain't working; need to do something else"  Pt also expressed anxiety over starting chemo pill.  States "I'm going to start them Sunday, I have a Cerebral Palsy Benefit that I participate in--- for 10 years and want to do this before starting chemo"  Request Dr. Benay Spice to call him re: starting Xeloda.  "after reading the packet and thinking it over; not sure I want to do this and would like Dr. Benay Spice to call me."  Note to Dr. Benay Spice.

## 2013-05-25 NOTE — Telephone Encounter (Signed)
Dr. Benay Spice spoke with Cody Suarez instructing him to stop iron pills and discussed starting Xeloda.  Noted per MD; Cody Suarez will start Xeloda Sunday 05/28/13 and understands to call if diarrhea does not improve.

## 2013-05-30 ENCOUNTER — Telehealth: Payer: Self-pay | Admitting: *Deleted

## 2013-05-30 ENCOUNTER — Encounter (HOSPITAL_COMMUNITY): Payer: Self-pay

## 2013-05-30 NOTE — Telephone Encounter (Signed)
Received fax from [facility]: Colon tissue [id number]: Microsatellite Stable. Results to Dr. Gearldine Shown desk for review.

## 2013-06-05 ENCOUNTER — Encounter (INDEPENDENT_AMBULATORY_CARE_PROVIDER_SITE_OTHER): Payer: Self-pay

## 2013-06-09 ENCOUNTER — Ambulatory Visit (HOSPITAL_BASED_OUTPATIENT_CLINIC_OR_DEPARTMENT_OTHER): Payer: BC Managed Care – PPO | Admitting: Oncology

## 2013-06-09 ENCOUNTER — Telehealth: Payer: Self-pay | Admitting: Oncology

## 2013-06-09 ENCOUNTER — Other Ambulatory Visit (HOSPITAL_BASED_OUTPATIENT_CLINIC_OR_DEPARTMENT_OTHER): Payer: BC Managed Care – PPO

## 2013-06-09 ENCOUNTER — Other Ambulatory Visit: Payer: Self-pay | Admitting: *Deleted

## 2013-06-09 VITALS — BP 172/69 | HR 87 | Temp 98.0°F | Resp 18 | Ht 70.0 in | Wt 169.0 lb

## 2013-06-09 DIAGNOSIS — C184 Malignant neoplasm of transverse colon: Secondary | ICD-10-CM

## 2013-06-09 DIAGNOSIS — D509 Iron deficiency anemia, unspecified: Secondary | ICD-10-CM

## 2013-06-09 LAB — CBC WITH DIFFERENTIAL/PLATELET
BASO%: 1.9 % (ref 0.0–2.0)
Basophils Absolute: 0.2 10*3/uL — ABNORMAL HIGH (ref 0.0–0.1)
EOS ABS: 0.3 10*3/uL (ref 0.0–0.5)
EOS%: 3.7 % (ref 0.0–7.0)
HCT: 29.2 % — ABNORMAL LOW (ref 38.4–49.9)
HGB: 9.1 g/dL — ABNORMAL LOW (ref 13.0–17.1)
LYMPH%: 21 % (ref 14.0–49.0)
MCH: 21.4 pg — AB (ref 27.2–33.4)
MCHC: 31.2 g/dL — AB (ref 32.0–36.0)
MCV: 68.6 fL — ABNORMAL LOW (ref 79.3–98.0)
MONO#: 0.9 10*3/uL (ref 0.1–0.9)
MONO%: 10 % (ref 0.0–14.0)
NEUT%: 63.4 % (ref 39.0–75.0)
NEUTROS ABS: 5.8 10*3/uL (ref 1.5–6.5)
PLATELETS: 461 10*3/uL — AB (ref 140–400)
RBC: 4.26 10*6/uL (ref 4.20–5.82)
RDW: 27.1 % — AB (ref 11.0–14.6)
WBC: 9.1 10*3/uL (ref 4.0–10.3)
lymph#: 1.9 10*3/uL (ref 0.9–3.3)

## 2013-06-09 MED ORDER — CAPECITABINE 500 MG PO TABS: ORAL_TABLET | ORAL | Status: DC

## 2013-06-09 NOTE — Telephone Encounter (Signed)
THIS REFILL REQUEST FOR CAPECITABINE WAS GIVEN TO DR.SHERRILL'S NURSE, SUSAN COWARD,RN.

## 2013-06-09 NOTE — Patient Instructions (Signed)
**   Please bring copy of your Advanced Directive to next visit to have document scanned into your medical record**

## 2013-06-09 NOTE — Telephone Encounter (Signed)
gv and printed appt sched and avs for pt fro July

## 2013-06-09 NOTE — Progress Notes (Signed)
  [place] OFFICE PROGRESS NOTE   Diagnosis: Colon cancer  INTERVAL HISTORY:   Cody Suarez began a first cycle of adjuvant Xeloda 05/28/2013. No mouth sores or hand/foot pain. He has 3-4 loose stools per day. This has occurred since he underwent the partial colectomy. Lomotil helps. Ferrous sulfate caused diarrhea and it was discontinued. He has returned to work.  Objective:  Vital signs in last 24 hours:  Blood pressure 172/69, pulse 87, temperature 98 F (36.7 C), temperature source Oral, resp. rate 18, height 5' 10"  (1.778 m), weight 169 lb (76.658 kg), SpO2 99.00%.    HEENT: No thrush or ulcers Resp: Lungs clear bilaterally Cardio: Regular rate and rhythm GI: Nontender, no hepatomegaly Vascular: The right lower leg is slightly larger than the left side, no erythema or tenderness, trace bilateral pretibial edema  Skin: Palms without erythema   Lab Results:  Lab Results  Component Value Date   WBC 9.1 06/09/2013   HGB 9.1* 06/09/2013   HCT 29.2* 06/09/2013   MCV 68.6* 06/09/2013   PLT 461* 06/09/2013   NEUTROABS 5.8 06/09/2013     Lab Results  Component Value Date   CEA 4.7 03/27/2013     Medications: I have reviewed the patient's current medications.  Assessment/Plan: 1. 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 Ulcerative colitis-extensive chronic active ulcerative colitis was noted on the colon resection specimen 03/28/2013 3. Hypertension 4. Microcytic anemia-likely iron deficiency, unable to tolerate oral iron       5.   possible area of cecal wall thickening noted on the abdomen CT 03/20/2013        6.   family history of colon  cancer    Disposition:  He appears to be tolerating the Xeloda well. He discontinued ferrous sulfate. We will follow the hemoglobin to look for correction of iron deficiency with removal of the colon tumor. We will consider IV iron if the hemoglobin does not improve.  He will see Dr. Watt Climes for a colonoscopy and management of ulcerative colitis.  Cody Suarez will begin cycle 2 Xeloda on 06/18/2013. He will return for an office and lab visit 07/06/2013.  [name], MD  06/09/2013  3:35 PM

## 2013-06-09 NOTE — Addendum Note (Signed)
Addended by: Jesse Fall on: 06/09/2013 03:57 PM   Modules accepted: Orders

## 2013-06-12 NOTE — Telephone Encounter (Signed)
RECEIVED A FAX FROM [facility] CONCERNING A CONFIRMATION OF FACSIMILE RECEIPT FOR PT.REFERRAL.

## 2013-06-15 ENCOUNTER — Other Ambulatory Visit (HOSPITAL_COMMUNITY)
Admission: RE | Admit: 2013-06-15 | Discharge: 2013-06-15 | Disposition: A | Discharge status: Home / Self Care | Payer: BC Managed Care – PPO | Source: Ambulatory Visit | Attending: Oncology | Admitting: Oncology

## 2013-06-15 DIAGNOSIS — C189 Malignant neoplasm of colon, unspecified: Secondary | ICD-10-CM | POA: Insufficient documentation

## 2013-06-15 NOTE — Telephone Encounter (Signed)
RECEIVED A FAX FROM [facility] CONCERNING A CONFIRMATION OF PRESCRIPTION SHIPMENT FOR CAPECITABINE ON 06/14/13.

## 2013-06-28 ENCOUNTER — Other Ambulatory Visit: Payer: Self-pay | Admitting: Gastroenterology

## 2013-07-03 ENCOUNTER — Ambulatory Visit (INDEPENDENT_AMBULATORY_CARE_PROVIDER_SITE_OTHER): Payer: BC Managed Care – PPO | Admitting: Surgery

## 2013-07-03 ENCOUNTER — Encounter (HOSPITAL_COMMUNITY): Payer: Self-pay

## 2013-07-03 ENCOUNTER — Encounter (INDEPENDENT_AMBULATORY_CARE_PROVIDER_SITE_OTHER): Payer: Self-pay | Admitting: Surgery

## 2013-07-03 VITALS — BP 145/55 | HR 82 | Temp 97.0°F | Resp 16 | Ht 70.0 in | Wt 162.8 lb

## 2013-07-03 DIAGNOSIS — Z9049 Acquired absence of other specified parts of digestive tract: Secondary | ICD-10-CM

## 2013-07-03 DIAGNOSIS — Z9889 Other specified postprocedural states: Secondary | ICD-10-CM

## 2013-07-03 NOTE — Progress Notes (Signed)
Cody Suarez 68 y.o.  Body mass index is 23.36 kg/(m^2).  Patient Active Problem List   Diagnosis Date Noted  . 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 Right inguinal hernia 03/23/2013  . Umbilical hernia 12/43/2755  . Ulcerative colitis, unspecified 03/23/2013    No Known Allergies    Past Surgical History  Procedure Laterality Date  . Hernia repair      umbilical hernia repair  . Tonsillectomy    . Vasectomy    . Laparoscopic right hemi colectomy N/A 03/28/2013    Procedure: LAPAROSCOPIC ASSISTED HEMI COLECTOMY;  Surgeon: Pedro Earls, MD;  Location: [facility];  Service: General;  Laterality: N/A;  . Colon surgery     SPEAR, TAMMY, MD No diagnosis found.  Incision has a permanent Novafil knot protruding.  I removed it.  Otherwise doing well.  Diarrhea better with new med that Dr. Watt Climes prescribed.  Recent colonoscopy had negative CMV.  Has been on Xeloda per Dr. Benay Spice.  Will see back in 6 months.  Matt B. Hassell Done, MD, [facility] [phone] beeper [phone]  07/03/2013 2:25 PM

## 2013-07-04 ENCOUNTER — Telehealth: Payer: Self-pay | Admitting: Dietician

## 2013-07-04 NOTE — Telephone Encounter (Signed)
Brief Outpatient Oncology Nutrition Note  Patient has been identified to be at risk on malnutrition screen.  Wt Readings from Last 10 Encounters:  07/03/13 162 lb 12.8 oz (73.846 kg)  06/09/13 169 lb (76.658 kg)  05/18/13 165 lb 9.6 oz (75.116 kg)  05/08/13 164 lb 3.2 oz (74.481 kg)  05/03/13 162 lb 12.8 oz (73.846 kg)  03/28/13 176 lb 9.4 oz (80.1 kg)  03/28/13 176 lb 9.4 oz (80.1 kg)  03/27/13 176 lb 4 oz (79.946 kg)  03/23/13 175 lb (79.379 kg)    Dx:  Colon Cancer.  Patient of Dr. Benay Spice.  Called patient who was unavailable.  Message left with contact information for the [facility].  Antonieta Iba, RD, LDN

## 2013-07-06 ENCOUNTER — Other Ambulatory Visit (HOSPITAL_BASED_OUTPATIENT_CLINIC_OR_DEPARTMENT_OTHER): Payer: BC Managed Care – PPO

## 2013-07-06 ENCOUNTER — Ambulatory Visit (HOSPITAL_BASED_OUTPATIENT_CLINIC_OR_DEPARTMENT_OTHER): Payer: BC Managed Care – PPO | Admitting: Nurse Practitioner

## 2013-07-06 ENCOUNTER — Telehealth: Payer: Self-pay | Admitting: Nurse Practitioner

## 2013-07-06 ENCOUNTER — Encounter: Payer: Self-pay | Admitting: *Deleted

## 2013-07-06 VITALS — BP 166/65 | HR 70 | Temp 98.6°F | Resp 20 | Ht 70.0 in | Wt 168.9 lb

## 2013-07-06 DIAGNOSIS — C184 Malignant neoplasm of transverse colon: Secondary | ICD-10-CM

## 2013-07-06 LAB — CBC WITH DIFFERENTIAL/PLATELET
BASO%: 0.7 % (ref 0.0–2.0)
Basophils Absolute: 0.1 10*3/uL (ref 0.0–0.1)
EOS%: 2.8 % (ref 0.0–7.0)
Eosinophils Absolute: 0.3 10*3/uL (ref 0.0–0.5)
HCT: 30.4 % — ABNORMAL LOW (ref 38.4–49.9)
HGB: 9.6 g/dL — ABNORMAL LOW (ref 13.0–17.1)
LYMPH%: 16.8 % (ref 14.0–49.0)
MCH: 23.7 pg — ABNORMAL LOW (ref 27.2–33.4)
MCHC: 31.6 g/dL — AB (ref 32.0–36.0)
MCV: 74.9 fL — ABNORMAL LOW (ref 79.3–98.0)
MONO#: 1.2 10*3/uL — ABNORMAL HIGH (ref 0.1–0.9)
MONO%: 11.2 % (ref 0.0–14.0)
NEUT%: 68.5 % (ref 39.0–75.0)
NEUTROS ABS: 7.4 10*3/uL — AB (ref 1.5–6.5)
PLATELETS: 332 10*3/uL (ref 140–400)
RBC: 4.06 10*6/uL — AB (ref 4.20–5.82)
RDW: 33.9 % — AB (ref 11.0–14.6)
WBC: 10.7 10*3/uL — ABNORMAL HIGH (ref 4.0–10.3)
lymph#: 1.8 10*3/uL (ref 0.9–3.3)

## 2013-07-06 LAB — COMPREHENSIVE METABOLIC PANEL (CC13)
ALBUMIN: 3.2 g/dL — AB (ref 3.5–5.0)
ALK PHOS: 74 U/L (ref 40–150)
ALT: 12 U/L (ref 0–55)
AST: 14 U/L (ref 5–34)
Anion Gap: 10 mEq/L (ref 3–11)
BILIRUBIN TOTAL: 0.38 mg/dL (ref 0.20–1.20)
BUN: 13.7 mg/dL (ref 7.0–26.0)
CO2: 22 mEq/L (ref 22–29)
Calcium: 8.4 mg/dL (ref 8.4–10.4)
Chloride: 107 mEq/L (ref 98–109)
Creatinine: 0.8 mg/dL (ref 0.7–1.3)
Glucose: 118 mg/dl (ref 70–140)
POTASSIUM: 3.1 meq/L — AB (ref 3.5–5.1)
Sodium: 139 mEq/L (ref 136–145)
TOTAL PROTEIN: 6.6 g/dL (ref 6.4–8.3)

## 2013-07-06 NOTE — Progress Notes (Signed)
RECEIVED A FAX FROM [facility] CONCERNING A CONFIRMATION OF PRESCRIPTION SHIPMENT FOR CAPECITABINE ON 07/05/13.

## 2013-07-06 NOTE — Telephone Encounter (Signed)
, °

## 2013-07-06 NOTE — Progress Notes (Signed)
  [place] OFFICE PROGRESS NOTE   Diagnosis:  Colon cancer.  INTERVAL HISTORY:   Cody Suarez returns as scheduled. He he began cycle 2 adjuvant Xeloda on 06/18/2013. He denies nausea/vomiting. No mouth sores. He recently started a new medication for ulcerative colitis and has noted significant improvement in diarrhea. He had a "normal" bowel movement yesterday. No hand or foot pain or redness. No abdominal pain. He reports a good appetite. Overall his energy level is good. He intermittently has difficulty falling asleep. He has taken Tylenol PM in the past with good results. He recently discontinued his blood pressure medication due to an episode of weakness.  Objective:  Vital signs in last 24 hours:  Blood pressure 166/65, pulse 70, temperature 98.6 F (37 C), temperature source Oral, resp. rate 20, height 5' 10"  (1.778 m), weight 168 lb 14.4 oz (76.613 kg).    HEENT: No thrush or ulcerations. Resp: Lungs clear. Cardio: Regular cardiac rhythm. GI: Abdomen is soft and nontender. No hepatomegaly. Vascular: Trace lower leg edema bilaterally. Skin: Palms without erythema.    Lab Results:  Lab Results  Component Value Date   WBC 10.7* 07/06/2013   HGB 9.6* 07/06/2013   HCT 30.4* 07/06/2013   MCV 74.9* 07/06/2013   PLT 332 07/06/2013   NEUTROABS 7.4* 07/06/2013      Medications: I have reviewed the patient's current medications.  Assessment/Plan: 1. 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 Tumor invaded through the muscularis propria into pericolonic fatty tissue and involved the attached omentum.  Cycle 1 adjuvant Xeloda 05/28/2013. Cycle 2 adjuvant Xeloda 06/18/2013. 2. Ulcerative colitis-extensive chronic active ulcerative colitis was noted on the colon resection specimen  03/28/2013. 3. Hypertension. He has discontinued his blood pressure medication. He will followup with his primary provider. 4. Microcytic anemia-likely iron deficiency, unable to tolerate oral iron. Improved. 5. Possible area of cecal wall thickening noted on abdominal CT 03/20/2013. 6. Family history of colon cancer.   Disposition: Cody Suarez appears stable. He has completed 2 cycles of adjuvant Xeloda. Plan to proceed with cycle 3 beginning 07/09/2013. He will return for a followup visit on 07/25/2013. He will contact the office in the interim with any problems.    Ned Card ANP/GNP-BC   07/06/2013  4:12 PM

## 2013-07-07 ENCOUNTER — Other Ambulatory Visit: Payer: Self-pay | Admitting: Oncology

## 2013-07-07 DIAGNOSIS — C184 Malignant neoplasm of transverse colon: Secondary | ICD-10-CM

## 2013-07-07 MED ORDER — POTASSIUM CHLORIDE CRYS ER 20 MEQ PO TBCR: 20.0000 meq | EXTENDED_RELEASE_TABLET | Freq: Every day | ORAL | Status: DC

## 2013-07-13 ENCOUNTER — Telehealth: Payer: Self-pay | Admitting: *Deleted

## 2013-07-13 ENCOUNTER — Other Ambulatory Visit: Payer: Self-pay | Admitting: *Deleted

## 2013-07-13 DIAGNOSIS — C184 Malignant neoplasm of transverse colon: Secondary | ICD-10-CM

## 2013-07-13 NOTE — Telephone Encounter (Signed)
Per Dr. Benay Spice, have him see NP tomorrow. Scheduled for 0830 on 07/14/13 for lab/OV with Drue Second, NP. Patient agrees.

## 2013-07-13 NOTE — Telephone Encounter (Signed)
Reporting "severe" cramping-like pain in both calves when he is up walking around. Will stop when he sits down and has no pain at night. Discomfort is bilateral. Has some mild swelling in his ankles. Denies any dyspnea or chest discomfort. He says he is well hydrated and not having diarrhea or sweating a lot. Is taking the K+ Dr. Benay Spice started last week.  His last Xeloda cycle started on 07/09/13. Requesting to be seen.

## 2013-07-14 ENCOUNTER — Ambulatory Visit (HOSPITAL_COMMUNITY)
Admission: RE | Admit: 2013-07-14 | Discharge: 2013-07-14 | Disposition: A | Discharge status: Home / Self Care | Payer: BC Managed Care – PPO | Source: Ambulatory Visit | Attending: Nurse Practitioner | Admitting: Nurse Practitioner

## 2013-07-14 ENCOUNTER — Other Ambulatory Visit: Payer: Self-pay | Admitting: *Deleted

## 2013-07-14 ENCOUNTER — Ambulatory Visit (HOSPITAL_BASED_OUTPATIENT_CLINIC_OR_DEPARTMENT_OTHER): Payer: BC Managed Care – PPO | Admitting: Nurse Practitioner

## 2013-07-14 ENCOUNTER — Other Ambulatory Visit (HOSPITAL_BASED_OUTPATIENT_CLINIC_OR_DEPARTMENT_OTHER): Payer: BC Managed Care – PPO

## 2013-07-14 ENCOUNTER — Other Ambulatory Visit (HOSPITAL_COMMUNITY): Payer: Self-pay | Admitting: *Deleted

## 2013-07-14 ENCOUNTER — Encounter: Payer: Self-pay | Admitting: Nurse Practitioner

## 2013-07-14 VITALS — BP 162/64 | HR 72 | Temp 97.3°F | Resp 18 | Ht 70.0 in | Wt 166.0 lb

## 2013-07-14 DIAGNOSIS — C184 Malignant neoplasm of transverse colon: Secondary | ICD-10-CM

## 2013-07-14 DIAGNOSIS — R252 Cramp and spasm: Secondary | ICD-10-CM

## 2013-07-14 DIAGNOSIS — I739 Peripheral vascular disease, unspecified: Secondary | ICD-10-CM

## 2013-07-14 DIAGNOSIS — M79609 Pain in unspecified limb: Secondary | ICD-10-CM

## 2013-07-14 LAB — CBC WITH DIFFERENTIAL/PLATELET
BASO%: 1.3 % (ref 0.0–2.0)
Basophils Absolute: 0.1 10*3/uL (ref 0.0–0.1)
EOS ABS: 0.3 10*3/uL (ref 0.0–0.5)
EOS%: 2.6 % (ref 0.0–7.0)
HEMATOCRIT: 34.3 % — AB (ref 38.4–49.9)
HEMOGLOBIN: 10.7 g/dL — AB (ref 13.0–17.1)
LYMPH%: 16.1 % (ref 14.0–49.0)
MCH: 23.9 pg — AB (ref 27.2–33.4)
MCHC: 31.1 g/dL — ABNORMAL LOW (ref 32.0–36.0)
MCV: 76.7 fL — ABNORMAL LOW (ref 79.3–98.0)
MONO#: 0.8 10*3/uL (ref 0.1–0.9)
MONO%: 7.8 % (ref 0.0–14.0)
NEUT%: 72.2 % (ref 39.0–75.0)
NEUTROS ABS: 7.8 10*3/uL — AB (ref 1.5–6.5)
PLATELETS: 269 10*3/uL (ref 140–400)
RBC: 4.47 10*6/uL (ref 4.20–5.82)
RDW: 34 % — AB (ref 11.0–14.6)
WBC: 10.8 10*3/uL — ABNORMAL HIGH (ref 4.0–10.3)
lymph#: 1.7 10*3/uL (ref 0.9–3.3)

## 2013-07-14 LAB — COMPREHENSIVE METABOLIC PANEL (CC13)
ALK PHOS: 65 U/L (ref 40–150)
ALT: 13 U/L (ref 0–55)
ANION GAP: 9 meq/L (ref 3–11)
AST: 14 U/L (ref 5–34)
Albumin: 3.3 g/dL — ABNORMAL LOW (ref 3.5–5.0)
BUN: 18.4 mg/dL (ref 7.0–26.0)
CALCIUM: 8.7 mg/dL (ref 8.4–10.4)
CO2: 24 mEq/L (ref 22–29)
CREATININE: 1 mg/dL (ref 0.7–1.3)
Chloride: 108 mEq/L (ref 98–109)
Glucose: 115 mg/dl (ref 70–140)
POTASSIUM: 3.9 meq/L (ref 3.5–5.1)
Sodium: 140 mEq/L (ref 136–145)
TOTAL PROTEIN: 6.8 g/dL (ref 6.4–8.3)
Total Bilirubin: 0.54 mg/dL (ref 0.20–1.20)

## 2013-07-14 LAB — MAGNESIUM (CC13): Magnesium: 2.2 mg/dl (ref 1.5–2.5)

## 2013-07-14 MED ORDER — HYDROCODONE-ACETAMINOPHEN 5-325 MG PO TABS: 1.0000 | ORAL_TABLET | Freq: Four times a day (QID) | ORAL | Status: DC | PRN

## 2013-07-14 NOTE — Progress Notes (Signed)
Call from [facility] in vascular lab : No DVT bilaterally, but per symptoms sounds like arterial disease. Suggests bilateral lower arterial duplex and ABI. Orders approved by Drue Second, NP.  Patient does not have a cardiologist.

## 2013-07-14 NOTE — Patient Instructions (Addendum)
[facility] Discharge Instructions  RECOMMENDATIONS MADE BY THE CONSULTANT AND ANY TEST RESULTS WILL BE SENT TO YOUR REFERRING PHYSICIAN.  EXAM FINDINGS BY THE PHYSICIAN TODAY AND SIGNS OR SYMPTOMS TO REPORT TO CLINIC OR PRIMARY PHYSICIAN:   MEDICATIONS PRESCRIBED:    INSTRUCTIONS GIVEN AND DISCUSSED: Register at [facility] at 10:15 for venous doppler at 10:30. You will wait there while results are called to office to determine any further action.   SPECIAL INSTRUCTIONS/FOLLOW-UP:  Please provide a copy of your Advanced Directive at next office visit to be scanned into your medical record.  Thank you for choosing [facility] to provide your oncology and hematology care.  To afford each patient quality time with our providers, please arrive at least 30 minutes before your scheduled appointment time.  With your help, our goal is to use those 30 minutes to complete the necessary work-up to ensure our physicians have the information they need to help with your evaluation and healthcare recommendations.     ___________________  Should you have questions after your visit to [facility], please contact our office at [phone] between the hours of 8:30 a.m. and 4:30 p.m.  Voicemails left after 4:00 p.m. will not be returned until the following business day.  For prescription refill requests, have your pharmacy contact our office with your prescription refill request. We request 24 hour notice for all refill requests.

## 2013-07-14 NOTE — Assessment & Plan Note (Signed)
Bilat Doppler US negative for DVT; but Arterial Brachial Index revealed obvious claudication bilaterally.  Reviewed all new findings with patient; and prescribed hydrocodone PRN for his leg pain. Advised patient to f/u with his PCP next week for further mgt of claudication. [facility] RN called and faxed Dr. Huey Romans office this report as well.

## 2013-07-14 NOTE — Progress Notes (Addendum)
*  Preliminary Results* Bilateral lower extremity venous duplex completed. Bilateral lower extremities are negative for deep vein thrombosis. There is no evidence of Baker's cyst bilaterally.  Bilateral Lower Extremity Arterial Duplex has been completed.   Bilateral lower extremities exhibit 20-49% stenosis by velocity criteria, and 50-99% stenosis by waveform criteria.   ARTERIAL  ABI completed:    RIGHT    LEFT    PRESSURE WAVEFORM  PRESSURE WAVEFORM  BRACHIAL 139 Triphasic BRACHIAL 139 Triphasic  DP 72 Monophasic DP 88 Monophasic  AT   AT    PT 70 Dampened monophasic PT 91 Monophasic  PER   PER    GREAT TOE  NA GREAT TOE  NA    RIGHT LEFT  ABI 0.52 0.65   The right ABI is suggestive of moderate, borderline severe arterial insufficiency, the left ABI is suggestive of moderate arterial insufficiency.  Preliminary results have been discussed with Drue Second, NP.  07/14/2013 12:06 PM Maudry Mayhew, RVT, RDCS, RDMS

## 2013-07-14 NOTE — Progress Notes (Signed)
[name]   Chief Complaint  Patient presents with  . Leg Pain    HPI: Cody Suarez 68 y.o. male diagnosed with colon cancer; and currently undergoing Xeloda therapy.    REASON FOR VISIT: C/O bi lat calf pain with exertion- specifically when walking. Denies any chest pain/chest pressure/SOB. Denies any fevers or chills. Denies any other symptoms.  Has been taking K+ tabs QD as previously directed.    Past Medical History  Diagnosis Date  . Diarrhea   . Hypertension   . History of kidney stones     x 2 episodes, none recent  . Arthritis     arthritis thumb joints  . Cancer     dx.colon cancer after colonoscopy   . Ulcerative colitis     history    Past Surgical History  Procedure Laterality Date  . Hernia repair      umbilical hernia repair  . Tonsillectomy    . Vasectomy    . Laparoscopic right hemi colectomy N/A 03/28/2013    Procedure: LAPAROSCOPIC ASSISTED HEMI COLECTOMY;  Surgeon: Pedro Earls, MD;  Location: [facility];  Service: General;  Laterality: N/A;  . 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     has No Known Allergies.    Medication List       This list is accurate as of: 07/14/13  2:56 PM.  Always use your most recent med list.               capecitabine 500 MG tablet  Commonly known as:  XELODA  Take 4 tabs (2,000 mg) QAM 3 tabs (1,500 mg) QPM (3,500 mg total daily) for 14 days, then 7 days off.     diphenoxylate-atropine 2.5-0.025 MG per tablet  Commonly known as:  LOMOTIL     HYDROcodone-acetaminophen 5-325 MG per tablet  Commonly known as:  NORCO/VICODIN  Take 1-2 tablets by mouth every 6 (six) hours as needed for moderate pain.     potassium chloride SA 20 MEQ tablet  Commonly known as:  K-DUR,KLOR-CON  Take 1 tablet (20 mEq total) by mouth daily.     UCERIS  9 MG Tb24  Generic drug:  Budesonide  Take 9 mg by mouth every morning.     VIAGRA 50 MG tablet  Generic drug:  sildenafil  Take 50 mg by mouth as needed for erectile dysfunction.          PHYSICAL EXAMINATION  Filed Vitals:   07/14/13 0945  BP: 162/64  Pulse:   Temp:   Resp:   Pulse 72, RR 18, Temp 97.3, O2 sat 100%.   GENERAL:alert, healthy, no distress, well nourished and well developed SKIN: skin color, texture, turgor are normal, no rashes or significant lesions OROPHARYNX:lips, buccal mucosa, and tongue normal  NECK: supple, no adenopathy, no bruits, no JVD LYMPH:  no palpable lymphadenopathy LUNGS: negative findings:  no chest deformities noted, normal respiratory rate and rhythm, no chest wall tenderness, diaphragmatic excursion normal, lungs clear to auscultation HEART: regular rate & rhythm, no murmurs and no gallops ABDOMEN:abdomen soft, non-tender, normal bowel sounds and no masses or organomegaly BACK: No CVA tenderness, Range of motion is normal EXTREMITIES:no edema, no clubbing, no cyanosis  NEURO: alert & oriented x 3 with fluent speech, gait normal  LABORATORY DATA:. CBC  Lab Results  Component Value Date   WBC 10.8* 07/14/2013   RBC 4.47 07/14/2013   HGB 10.7* 07/14/2013   HCT 34.3* 07/14/2013   PLT 269 07/14/2013   MCV 76.7* 07/14/2013   MCH 23.9* 07/14/2013   MCHC 31.1* 07/14/2013   RDW 34.0* 07/14/2013   LYMPHSABS 1.7 07/14/2013   MONOABS 0.8 07/14/2013   EOSABS 0.3 07/14/2013   BASOSABS 0.1 07/14/2013     CMET  Lab Results  Component Value Date   NA 140 07/14/2013   K 3.9 07/14/2013   CL 100 03/29/2013   CO2 24 07/14/2013   GLUCOSE 115 07/14/2013   BUN 18.4 07/14/2013   CREATININE 1.0 07/14/2013   CALCIUM 8.7 07/14/2013   PROT 6.8 07/14/2013   ALBUMIN 3.3* 07/14/2013   AST 14 07/14/2013   ALT 13 07/14/2013   ALKPHOS 65 07/14/2013   BILITOT 0.54 07/14/2013   GFRNONAA 74* 03/29/2013   GFRAA 86* 03/29/2013   RADIOGRAPHIC STUDIES:     Patient Name  Sex DOB SSN   Cody Suarez, Cody Suarez Male Apr 18, 1945 [id number]            Progress Notes by Doyne Keel Simonetti at 07/14/2013 12:04 PM    Author: Doyne Keel Simonetti Service: Vascular Lab Author Type: Cardiovascular Sonographer   Filed: 07/14/2013 12:11 PM Note Time: 07/14/2013 12:04 PM Status: Signed   Editor: Doyne Keel Simonetti (Cardiovascular Sonographer)      *Preliminary Results*  Bilateral lower extremity venous duplex completed.  Bilateral lower extremities are negative for deep vein thrombosis. There is no evidence of Baker's cyst bilaterally.  Bilateral Lower Extremity Arterial Duplex has been completed.  Bilateral lower extremities exhibit 20-49% stenosis by velocity criteria, and 50-99% stenosis by waveform criteria.  ARTERIAL  ABI completed:      RIGHT    LEFT       PRESSURE  WAVEFORM   PRESSURE  WAVEFORM     BRACHIAL  139  Triphasic  BRACHIAL  139  Triphasic     DP  72  Monophasic  DP  88  Monophasic     AT    AT       PT  70  Dampened monophasic  PT  91  Monophasic     PER    PER       GREAT TOE   NA  GREAT TOE   NA      RIGHT  LEFT     ABI  0.52  0.65     Preliminary results have been discussed with Drue Second, NP.  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 Next follw up appt scheduled for 07/25/2013.    Claudication  Assessment & Plan Bilat Doppler US negative for DVT; but Arterial Brachial Index revealed obvious claudication bilaterally.  Reviewed all new findings with patient; and prescribed hydrocodone PRN for his leg pain. Advised patient to f/u with his PCP next week for further mgt of claudication. [facility] RN called and faxed Dr. Huey Romans office this report as well.    Patient stated understanding of all instructions; and was in agreement with this plan  of care. The patient knows to call the clinic with any problems, questions or concerns.   Total time spent with patient was 40 minutes;  with greater than 80  percent of that time spent in face to face counseling regarding his symptoms, review of his scan results, and coordination of care and followup.  Drue Second, NP 07/14/2013

## 2013-07-14 NOTE — Progress Notes (Signed)
Attempted to call PCP with results of arterial studies today and to see patient in the next week. Office of Dr. Florina Ou was closed. Faxed report to office and will call Monday to ensure it was received and f/u scheduled.

## 2013-07-14 NOTE — Assessment & Plan Note (Signed)
Patient continues on Xeloda; and appears to be tolerating well. Next follw up appt scheduled for 07/25/2013.

## 2013-07-17 ENCOUNTER — Telehealth: Payer: Self-pay | Admitting: *Deleted

## 2013-07-17 NOTE — Telephone Encounter (Signed)
Spoke with Jerene Pitch with Dr. Teofilo Pod office. She requests we re-fax his notes from 07/14/13 and she will forward to Dr. Modena Morrow to get cardiology referral. Confirmed correct fax # with office.

## 2013-07-19 ENCOUNTER — Telehealth: Payer: Self-pay | Admitting: *Deleted

## 2013-07-19 NOTE — Telephone Encounter (Signed)
Left message requesting pt call with update. Has he seen PCP or cardiology?

## 2013-07-21 ENCOUNTER — Other Ambulatory Visit: Payer: Self-pay | Admitting: *Deleted

## 2013-07-21 DIAGNOSIS — C184 Malignant neoplasm of transverse colon: Secondary | ICD-10-CM

## 2013-07-21 NOTE — Telephone Encounter (Signed)
THIS REFILL REQUEST FOR CAPECITABINE WAS GIVEN TO DR.SHERRILL'S NURSE, SUSAN COWARD,RN.

## 2013-07-24 ENCOUNTER — Telehealth: Payer: Self-pay | Admitting: *Deleted

## 2013-07-24 ENCOUNTER — Other Ambulatory Visit: Payer: Self-pay | Admitting: *Deleted

## 2013-07-24 DIAGNOSIS — I739 Peripheral vascular disease, unspecified: Secondary | ICD-10-CM

## 2013-07-24 MED ORDER — HYDROCODONE-ACETAMINOPHEN 5-325 MG PO TABS: 1.0000 | ORAL_TABLET | Freq: Four times a day (QID) | ORAL | Status: DC | PRN

## 2013-07-24 MED ORDER — CAPECITABINE 500 MG PO TABS: ORAL_TABLET | ORAL | Status: DC

## 2013-07-24 NOTE — Telephone Encounter (Signed)
RECEIVED A FAX FROM [facility] CONCERNING A CONFIRMATION OF FACSIMILE RECEIPT FOR PT. REFERRAL.

## 2013-07-24 NOTE — Telephone Encounter (Signed)
Patient called requesting refill on pain med.  "I have 4 pills left and do not see the vascular doctor until 08-01-2013.  Hydrocodone/APAP 5-325 mg take 1-2 q6hrs as needed for leg pain qty. 20 was given on 07-14-2013.  Will ask providers.  Patient may be reached at work number [phone] ext. [phone].

## 2013-07-24 NOTE — Telephone Encounter (Signed)
Refill request authorized.  Called patient notifying him of refill.  He will come in at 12:00 pm today to pick up script.

## 2013-07-24 NOTE — Addendum Note (Signed)
Addended by: Wyonia Hough on: 07/24/2013 10:41 AM   Modules accepted: Orders

## 2013-07-25 ENCOUNTER — Ambulatory Visit (HOSPITAL_BASED_OUTPATIENT_CLINIC_OR_DEPARTMENT_OTHER): Payer: BC Managed Care – PPO | Admitting: Nurse Practitioner

## 2013-07-25 ENCOUNTER — Other Ambulatory Visit (HOSPITAL_BASED_OUTPATIENT_CLINIC_OR_DEPARTMENT_OTHER): Payer: BC Managed Care – PPO

## 2013-07-25 ENCOUNTER — Telehealth: Payer: Self-pay | Admitting: Nurse Practitioner

## 2013-07-25 VITALS — BP 159/74 | HR 85 | Temp 98.6°F | Resp 18 | Ht 70.0 in | Wt 164.4 lb

## 2013-07-25 DIAGNOSIS — Z800 Family history of malignant neoplasm of digestive organs: Secondary | ICD-10-CM

## 2013-07-25 DIAGNOSIS — C184 Malignant neoplasm of transverse colon: Secondary | ICD-10-CM

## 2013-07-25 DIAGNOSIS — I771 Stricture of artery: Secondary | ICD-10-CM

## 2013-07-25 DIAGNOSIS — D509 Iron deficiency anemia, unspecified: Secondary | ICD-10-CM

## 2013-07-25 DIAGNOSIS — I10 Essential (primary) hypertension: Secondary | ICD-10-CM

## 2013-07-25 LAB — CBC WITH DIFFERENTIAL/PLATELET
BASO%: 0.8 % (ref 0.0–2.0)
BASOS ABS: 0.1 10*3/uL (ref 0.0–0.1)
EOS%: 1.9 % (ref 0.0–7.0)
Eosinophils Absolute: 0.2 10*3/uL (ref 0.0–0.5)
HCT: 32.3 % — ABNORMAL LOW (ref 38.4–49.9)
HEMOGLOBIN: 10.2 g/dL — AB (ref 13.0–17.1)
LYMPH#: 1.6 10*3/uL (ref 0.9–3.3)
LYMPH%: 15.2 % (ref 14.0–49.0)
MCH: 25.2 pg — ABNORMAL LOW (ref 27.2–33.4)
MCHC: 31.5 g/dL — ABNORMAL LOW (ref 32.0–36.0)
MCV: 80.2 fL (ref 79.3–98.0)
MONO#: 0.7 10*3/uL (ref 0.1–0.9)
MONO%: 6.5 % (ref 0.0–14.0)
NEUT#: 7.8 10*3/uL — ABNORMAL HIGH (ref 1.5–6.5)
NEUT%: 75.6 % — ABNORMAL HIGH (ref 39.0–75.0)
Platelets: 356 10*3/uL (ref 140–400)
RBC: 4.03 10*6/uL — ABNORMAL LOW (ref 4.20–5.82)
RDW: 37.4 % — ABNORMAL HIGH (ref 11.0–14.6)
WBC: 10.3 10*3/uL (ref 4.0–10.3)

## 2013-07-25 LAB — COMPREHENSIVE METABOLIC PANEL (CC13)
ALT: 9 U/L (ref 0–55)
AST: 13 U/L (ref 5–34)
Albumin: 3.4 g/dL — ABNORMAL LOW (ref 3.5–5.0)
Alkaline Phosphatase: 77 U/L (ref 40–150)
Anion Gap: 9 mEq/L (ref 3–11)
BUN: 17.2 mg/dL (ref 7.0–26.0)
CALCIUM: 8.8 mg/dL (ref 8.4–10.4)
CHLORIDE: 107 meq/L (ref 98–109)
CO2: 22 mEq/L (ref 22–29)
Creatinine: 1.1 mg/dL (ref 0.7–1.3)
GLUCOSE: 121 mg/dL (ref 70–140)
Potassium: 3.7 mEq/L (ref 3.5–5.1)
SODIUM: 137 meq/L (ref 136–145)
Total Bilirubin: 0.42 mg/dL (ref 0.20–1.20)
Total Protein: 6.8 g/dL (ref 6.4–8.3)

## 2013-07-25 NOTE — Progress Notes (Signed)
  Cody Suarez OFFICE PROGRESS NOTE   Diagnosis: Colon cancer.   INTERVAL HISTORY:   Cody Suarez returns as scheduled. He completed cycle 3 Xeloda beginning 07/09/2013. He denies nausea/vomiting. No mouth sores. No significant diarrhea. He denies hand or foot pain or redness. He continues to have bilateral calf and low anterior leg pain. The pain is present at rest but significantly worsens with activity.  Objective:  Vital signs in last 24 hours:  Blood pressure 159/74, pulse 85, temperature 98.6 F (37 C), temperature source Oral, resp. rate 18, height 5' 10"  (1.778 m), weight 164 lb 6.4 oz (74.571 kg), SpO2 99.00%.    HEENT: No thrush or ulcerations. Resp: Lungs clear. Cardio: Regular cardiac rhythm. GI: Abdomen soft and nontender. No hepatomegaly. Vascular: Trace lower leg edema bilaterally. Skin:Palms and soles without erythema. No skin breakdown.     Lab Results:  Lab Results  Component Value Date   WBC 10.3 07/25/2013   HGB 10.2* 07/25/2013   HCT 32.3* 07/25/2013   MCV 80.2 07/25/2013   PLT 356 07/25/2013   NEUTROABS 7.8* 07/25/2013    Imaging:  No results found.  Medications: I have reviewed the patient's current medications.  Assessment/Plan: 1. 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 Tumor invaded through the muscularis propria into pericolonic fatty tissue and involved the attached omentum.  Cycle 1 adjuvant Xeloda 05/28/2013.  Cycle 2 adjuvant Xeloda 06/18/2013. Cycle 3 adjuvant Xeloda 07/09/2013. 2. Ulcerative colitis-extensive chronic active ulcerative colitis was noted on the colon resection specimen 03/28/2013. 3. Hypertension. He has discontinued his blood pressure medication. He will followup with his primary provider. 4. Microcytic anemia-likely iron  deficiency, unable to tolerate oral iron. Improved. 5. Possible area of cecal wall thickening noted on abdominal CT 03/20/2013. 6. Family history of colon cancer. 7. Bilateral calf and low anterior leg pain. Bilateral lower extremity venous duplex negative for DVT 07/14/2013. Right ABI with moderate, borderline severe arterial insufficiency; left ABI suggestive of moderate arterial insufficiency. He has been referred to vascular.   Disposition: Cody Suarez has completed 3 cycles of adjuvant Xeloda. He appears to be tolerating the Xeloda well. Plan to proceed with cycle 4 as scheduled beginning 07/30/2013.  He will return for a followup visit on 08/18/2013. He will contact the office in the interim with any problems.  Plan reviewed with Dr. Benay Spice.    Ned Card ANP/GNP-BC   07/25/2013  4:15 PM

## 2013-07-25 NOTE — Telephone Encounter (Signed)
Pt confirmed labs/ov per 07/21 POF, gave pt AVS..Marland KitchenKJ

## 2013-07-31 NOTE — Telephone Encounter (Signed)
RECEIVED A FAX FROM [facility] CONCERNING A CONFIRMATION OF PRESCRIPTION SHIPMENT FOR CAPECITABINE ON 07/28/13.

## 2013-08-01 ENCOUNTER — Ambulatory Visit (INDEPENDENT_AMBULATORY_CARE_PROVIDER_SITE_OTHER): Payer: BC Managed Care – PPO | Admitting: Cardiovascular Disease

## 2013-08-01 ENCOUNTER — Encounter: Payer: Self-pay | Admitting: Cardiology

## 2013-08-01 ENCOUNTER — Encounter: Payer: Self-pay | Admitting: Cardiovascular Disease

## 2013-08-01 VITALS — BP 162/71 | HR 79 | Ht 70.0 in | Wt 165.1 lb

## 2013-08-01 DIAGNOSIS — I739 Peripheral vascular disease, unspecified: Secondary | ICD-10-CM

## 2013-08-01 LAB — CBC
HCT: 32.3 % — ABNORMAL LOW (ref 39.0–52.0)
HEMOGLOBIN: 10.4 g/dL — AB (ref 13.0–17.0)
MCHC: 32.2 g/dL (ref 30.0–36.0)
MCV: 80.9 fl (ref 78.0–100.0)
Platelets: 379 10*3/uL (ref 150.0–400.0)
RBC: 4 Mil/uL — ABNORMAL LOW (ref 4.22–5.81)
RDW: 37 % — AB (ref 11.5–15.5)
WBC: 10 10*3/uL (ref 4.0–10.5)

## 2013-08-01 LAB — BASIC METABOLIC PANEL
BUN: 13 mg/dL (ref 6–23)
CO2: 29 mEq/L (ref 19–32)
Calcium: 8.8 mg/dL (ref 8.4–10.5)
Chloride: 107 mEq/L (ref 96–112)
Creatinine, Ser: 0.9 mg/dL (ref 0.4–1.5)
GFR: 85.72 mL/min (ref >60.00)
Glucose, Bld: 78 mg/dL (ref 70–99)
POTASSIUM: 3.5 meq/L (ref 3.5–5.1)
SODIUM: 138 meq/L (ref 135–145)

## 2013-08-01 LAB — PROTIME-INR
INR: 1 ratio (ref 0.8–1.0)
Prothrombin Time: 11 s (ref 9.6–13.1)

## 2013-08-01 NOTE — Patient Instructions (Signed)
Your physician recommends that you return for lab work today for Bmet, CBC and PT/INR.  Your physician has requested that you have a peripheral vascular angiogram. This exam is performed at the hospital. During this exam IV contrast is used to look at arterial blood flow. Please review the information sheet given for details.

## 2013-08-01 NOTE — Progress Notes (Signed)
HPI  This is a 68 year old pleasant man who was referred for evaluation and management of peripheral arterial disease. He has no previous cardiac history and no chronic medical conditions. Specifically, he is not diabetic and does not smoke. He was diagnosed with stage II colon cancer early this year and underwent partial colectomy in February followed by chemotherapy with Xeloda which is still ongoing. He started having bilateral calf discomfort which started about 2-3 months ago with significant worsening. This is now happening with walking about 100 feet and forces him to stop and rest for a few minutes before he can resume. There is no rest pain and no lower extremity ulceration. He underwent lower extremity venous duplex which showed no evidence of DVT. ABI was moderately to severely reduced bilaterally at 0.52 on the right and 0.6 on the left.    No Known Allergies   Current Outpatient Prescriptions on File Prior to Visit  Medication Sig Dispense Refill  . aspirin 81 MG tablet Take 81 mg by mouth daily.      . Budesonide (UCERIS) 9 MG TB24 Take 9 mg by mouth every morning.      . capecitabine (XELODA) 500 MG tablet Take 4 tabs (2,000 mg) QAM 3 tabs (1,500 mg) QPM (3,500 mg total daily) for 14 days, then 7 days off.  98 tablet  1  . HYDROcodone-acetaminophen (NORCO/VICODIN) 5-325 MG per tablet Take 1-2 tablets by mouth every 6 (six) hours as needed for moderate pain.  30 tablet  0  . potassium chloride SA (K-DUR,KLOR-CON) 20 MEQ tablet Take 1 tablet (20 mEq total) by mouth daily.  30 tablet  3  . VIAGRA 50 MG tablet Take 50 mg by mouth as needed for erectile dysfunction.        No current facility-administered medications on file prior to visit.     Past Medical History  Diagnosis Date  . Diarrhea   . Hypertension   . History of kidney stones     x 2 episodes, none recent  . Arthritis     arthritis thumb joints  . Cancer     dx.colon cancer after colonoscopy   . Ulcerative  colitis     history     Past Surgical History  Procedure Laterality Date  . Hernia repair      umbilical hernia repair  . Tonsillectomy    . Vasectomy    . Laparoscopic right hemi colectomy N/A 03/28/2013    Procedure: LAPAROSCOPIC ASSISTED HEMI COLECTOMY;  Surgeon: Pedro Earls, MD;  Location: [facility];  Service: General;  Laterality: N/A;  . Colon surgery       Family History  Problem Relation Age of Onset  . Heart disease Mother      History   Social History  . Marital Status: Married    Spouse Name: N/A    Number of Children: N/A  . Years of Education: N/A   Occupational History  . Not on file.   Social History Main Topics  . Smoking status: Never Smoker   . Smokeless tobacco: Not on file  . Alcohol Use: Yes     Comment: occ. x3 glasses week  . Drug Use: No  . Sexual Activity: Yes   Other Topics Concern  . Not on file   Social History Narrative  . No narrative on file     ROS A 10 point review of system was performed. It is negative other than that mentioned in the history of  present illness.   PHYSICAL EXAM   BP 162/71  Pulse 79  Ht 5' 10"  (1.778 m)  Wt 165 lb 1.9 oz (74.898 kg)  BMI 23.69 kg/m2 Constitutional: He is oriented to person, place, and time. He appears well-developed and well-nourished. No distress.  HENT: No nasal discharge.  Head: Normocephalic and atraumatic.  Eyes: Pupils are equal and round.  No discharge. Neck: Normal range of motion. Neck supple. No JVD present. No thyromegaly present. No carotid bruits Cardiovascular: Normal rate, regular rhythm, normal heart sounds. Exam reveals no gallop and no friction rub. No murmur heard.  Pulmonary/Chest: Effort normal and breath sounds normal. No stridor. No respiratory distress. He has no wheezes. He has no rales. He exhibits no tenderness.  Abdominal: Soft. Bowel sounds are normal. He exhibits no distension. There is no tenderness. There is no rebound and no guarding.    Musculoskeletal: Normal range of motion. He exhibits no edema and no tenderness.  Neurological: He is alert and oriented to person, place, and time. Coordination normal.  Skin: Skin is warm and dry. No rash noted. He is not diaphoretic. No erythema. No pallor.  Psychiatric: He has a normal mood and affect. His behavior is normal. Judgment and thought content normal.  Vascular: Radial pulses are normal bilaterally. Femoral pulses are normal bilaterally. Distal pulses are not palpable.     ASSESSMENT AND PLAN

## 2013-08-03 ENCOUNTER — Encounter (HOSPITAL_COMMUNITY): Payer: Self-pay | Admitting: Pharmacy Technician

## 2013-08-03 NOTE — Assessment & Plan Note (Signed)
The patient is having severe bilateral calf claudication (Rutherford class 3). This started a few months ago and correlated with recent colon cancer diagnosis. Due to this fact and given the lack of traditional risk factors for atherosclerosis, other etiologies such as hypercoagulable state or inflammatory plaque rupture might be the cause of his presentation. I discussed different management options with him and given the severity of his symptoms which has significantly affected his lifestyle, I recommend proceeding with an abdominal aortogram with lower extremity runoff and possible endovascular intervention. Risks and benefits were discussed extensively.

## 2013-08-09 ENCOUNTER — Encounter (HOSPITAL_COMMUNITY)
Admission: RE | Disposition: A | Discharge status: Home / Self Care | Payer: Self-pay | Source: Ambulatory Visit | Attending: Cardiovascular Disease

## 2013-08-09 ENCOUNTER — Encounter (HOSPITAL_COMMUNITY): Payer: Self-pay | Admitting: General Practice

## 2013-08-09 ENCOUNTER — Inpatient Hospital Stay (HOSPITAL_COMMUNITY)
Admission: RE | Admit: 2013-08-09 | Discharge: 2013-08-14 | DRG: 253 | Disposition: A | Discharge status: Home / Self Care | Payer: BC Managed Care – PPO | Source: Ambulatory Visit | Attending: Cardiovascular Disease | Admitting: Cardiovascular Disease

## 2013-08-09 DIAGNOSIS — I70219 Atherosclerosis of native arteries of extremities with intermittent claudication, unspecified extremity: Secondary | ICD-10-CM

## 2013-08-09 DIAGNOSIS — Z9221 Personal history of antineoplastic chemotherapy: Secondary | ICD-10-CM

## 2013-08-09 DIAGNOSIS — Z7982 Long term (current) use of aspirin: Secondary | ICD-10-CM

## 2013-08-09 DIAGNOSIS — Z8249 Family history of ischemic heart disease and other diseases of the circulatory system: Secondary | ICD-10-CM

## 2013-08-09 DIAGNOSIS — I719 Aortic aneurysm of unspecified site, without rupture: Secondary | ICD-10-CM | POA: Diagnosis present

## 2013-08-09 DIAGNOSIS — M129 Arthropathy, unspecified: Secondary | ICD-10-CM | POA: Diagnosis present

## 2013-08-09 DIAGNOSIS — I10 Essential (primary) hypertension: Secondary | ICD-10-CM | POA: Diagnosis present

## 2013-08-09 DIAGNOSIS — I700 Atherosclerosis of aorta: Secondary | ICD-10-CM

## 2013-08-09 DIAGNOSIS — I739 Peripheral vascular disease, unspecified: Secondary | ICD-10-CM | POA: Diagnosis present

## 2013-08-09 DIAGNOSIS — I7092 Chronic total occlusion of artery of the extremities: Secondary | ICD-10-CM | POA: Diagnosis present

## 2013-08-09 DIAGNOSIS — I743 Embolism and thrombosis of arteries of the lower extremities: Secondary | ICD-10-CM | POA: Diagnosis present

## 2013-08-09 DIAGNOSIS — Z85038 Personal history of other malignant neoplasm of large intestine: Secondary | ICD-10-CM

## 2013-08-09 DIAGNOSIS — I749 Embolism and thrombosis of unspecified artery: Secondary | ICD-10-CM | POA: Diagnosis present

## 2013-08-09 HISTORY — PX: ABDOMINAL AORTAGRAM: SHX5454

## 2013-08-09 HISTORY — PX: LOWER EXTREMITY ANGIOGRAM: SHX5955

## 2013-08-09 HISTORY — DX: Malignant neoplasm of colon, unspecified: C18.9

## 2013-08-09 HISTORY — DX: Anemia in other chronic diseases classified elsewhere: D63.8

## 2013-08-09 LAB — PROTIME-INR
INR: 1.29 (ref 0.00–1.49)
PROTHROMBIN TIME: 16.1 s — AB (ref 11.6–15.2)

## 2013-08-09 SURGERY — ABDOMINAL AORTAGRAM
Anesthesia: LOCAL

## 2013-08-09 MED ORDER — FENTANYL CITRATE 0.05 MG/ML IJ SOLN
INTRAMUSCULAR | Status: AC
  Filled 2013-08-09: qty 2

## 2013-08-09 MED ORDER — ASPIRIN 81 MG PO CHEW
324.0000 mg | CHEWABLE_TABLET | ORAL | Status: AC
  Filled 2013-08-09: qty 4

## 2013-08-09 MED ORDER — SODIUM CHLORIDE 0.9 % IV SOLN: INTRAVENOUS | Status: AC

## 2013-08-09 MED ORDER — ASPIRIN 81 MG PO CHEW
CHEWABLE_TABLET | ORAL | Status: AC
  Administered 2013-08-09: 81 mg
  Filled 2013-08-09: qty 1

## 2013-08-09 MED ORDER — HYDROCODONE-ACETAMINOPHEN 5-325 MG PO TABS
1.0000 | ORAL_TABLET | Freq: Two times a day (BID) | ORAL | Status: DC | PRN
  Administered 2013-08-11 – 2013-08-14 (×3): 1 via ORAL
  Filled 2013-08-09 (×3): qty 1

## 2013-08-09 MED ORDER — LIDOCAINE HCL (PF) 1 % IJ SOLN
INTRAMUSCULAR | Status: AC
  Filled 2013-08-09: qty 30

## 2013-08-09 MED ORDER — SODIUM CHLORIDE 0.9 % IJ SOLN: 3.0000 mL | Freq: Two times a day (BID) | INTRAMUSCULAR | Status: DC

## 2013-08-09 MED ORDER — BUDESONIDE 9 MG PO TB24: 9.0000 mg | ORAL_TABLET | Freq: Every day | ORAL | Status: DC

## 2013-08-09 MED ORDER — SODIUM CHLORIDE 0.9 % IJ SOLN: 3.0000 mL | INTRAMUSCULAR | Status: DC | PRN

## 2013-08-09 MED ORDER — ACETAMINOPHEN 500 MG PO TABS: 1000.0000 mg | ORAL_TABLET | Freq: Three times a day (TID) | ORAL | Status: DC | PRN

## 2013-08-09 MED ORDER — SODIUM CHLORIDE 0.9 % IV SOLN
INTRAVENOUS | Status: DC
  Administered 2013-08-09: 100 mL/h via INTRAVENOUS

## 2013-08-09 MED ORDER — CAPECITABINE 500 MG PO TABS
2000.0000 mg | ORAL_TABLET | Freq: Every day | ORAL | Status: AC
  Administered 2013-08-10: 2000 mg via ORAL
  Filled 2013-08-09 (×4): qty 4

## 2013-08-09 MED ORDER — ACETAMINOPHEN 325 MG PO TABS
650.0000 mg | ORAL_TABLET | ORAL | Status: DC | PRN
  Administered 2013-08-10: 650 mg via ORAL
  Filled 2013-08-09: qty 2

## 2013-08-09 MED ORDER — MIDAZOLAM HCL 2 MG/2ML IJ SOLN
INTRAMUSCULAR | Status: AC
  Filled 2013-08-09: qty 2

## 2013-08-09 MED ORDER — POTASSIUM CHLORIDE CRYS ER 20 MEQ PO TBCR
20.0000 meq | EXTENDED_RELEASE_TABLET | Freq: Every day | ORAL | Status: DC
  Administered 2013-08-09 – 2013-08-13 (×3): 20 meq via ORAL
  Filled 2013-08-09 (×6): qty 1

## 2013-08-09 MED ORDER — HEPARIN BOLUS VIA INFUSION
3500.0000 [IU] | Freq: Once | INTRAVENOUS | Status: DC
  Filled 2013-08-09: qty 3500

## 2013-08-09 MED ORDER — ASPIRIN 81 MG PO CHEW: 81.0000 mg | CHEWABLE_TABLET | ORAL | Status: DC

## 2013-08-09 MED ORDER — BUDESONIDE 3 MG PO CP24
9.0000 mg | ORAL_CAPSULE | Freq: Every day | ORAL | Status: DC
  Filled 2013-08-09 (×4): qty 3

## 2013-08-09 MED ORDER — AMLODIPINE BESYLATE 5 MG PO TABS
5.0000 mg | ORAL_TABLET | Freq: Every day | ORAL | Status: DC
  Administered 2013-08-09 – 2013-08-14 (×4): 5 mg via ORAL
  Filled 2013-08-09 (×6): qty 1

## 2013-08-09 MED ORDER — HEPARIN (PORCINE) IN NACL 2-0.9 UNIT/ML-% IJ SOLN
INTRAMUSCULAR | Status: AC
  Filled 2013-08-09: qty 500

## 2013-08-09 MED ORDER — CAPECITABINE 500 MG PO TABS
1500.0000 mg | ORAL_TABLET | Freq: Every day | ORAL | Status: AC
  Administered 2013-08-09: 18:00:00 1500 mg via ORAL
  Filled 2013-08-09 (×3): qty 3

## 2013-08-09 MED ORDER — ONDANSETRON HCL 4 MG/2ML IJ SOLN: 4.0000 mg | Freq: Four times a day (QID) | INTRAMUSCULAR | Status: DC | PRN

## 2013-08-09 MED ORDER — CAPECITABINE 500 MG PO TABS: 2000.0000 mg | ORAL_TABLET | Freq: Every day | ORAL | Status: DC

## 2013-08-09 MED ORDER — SODIUM CHLORIDE 0.9 % IV SOLN
INTRAVENOUS | Status: DC
  Administered 2013-08-10: 09:00:00 500 mL via INTRAVENOUS

## 2013-08-09 MED ORDER — HEPARIN (PORCINE) IN NACL 100-0.45 UNIT/ML-% IJ SOLN
1300.0000 [IU]/h | INTRAMUSCULAR | Status: DC
  Administered 2013-08-09: 21:00:00 1100 [IU]/h via INTRAVENOUS
  Filled 2013-08-09 (×3): qty 250

## 2013-08-09 MED ORDER — HEPARIN (PORCINE) IN NACL 100-0.45 UNIT/ML-% IJ SOLN
1100.0000 [IU]/h | INTRAMUSCULAR | Status: DC
  Filled 2013-08-09: qty 250

## 2013-08-09 MED ORDER — SODIUM CHLORIDE 0.9 % IV SOLN: 250.0000 mL | INTRAVENOUS | Status: DC | PRN

## 2013-08-09 MED ORDER — SODIUM CHLORIDE 0.9 % IJ SOLN
3.0000 mL | INTRAMUSCULAR | Status: DC | PRN
Start: 2013-08-09 — End: 2013-08-09

## 2013-08-09 MED ORDER — SODIUM CHLORIDE 0.9 % IJ SOLN
3.0000 mL | Freq: Two times a day (BID) | INTRAMUSCULAR | Status: DC
  Administered 2013-08-10 – 2013-08-14 (×5): 3 mL via INTRAVENOUS

## 2013-08-09 MED ORDER — SODIUM CHLORIDE 0.9 % IV SOLN: INTRAVENOUS | Status: DC

## 2013-08-09 MED ORDER — ASPIRIN 300 MG RE SUPP
300.0000 mg | RECTAL | Status: AC
  Filled 2013-08-09: qty 1

## 2013-08-09 MED ORDER — CAPECITABINE 500 MG PO TABS
1500.0000 mg | ORAL_TABLET | Freq: Every day | ORAL | Status: DC
  Filled 2013-08-09: qty 3

## 2013-08-09 MED ORDER — NITROGLYCERIN 0.4 MG SL SUBL: 0.4000 mg | SUBLINGUAL_TABLET | SUBLINGUAL | Status: DC | PRN

## 2013-08-09 NOTE — Care Management Note (Addendum)
    Page 1 of 2   08/14/2013     5:16:29 PM CARE MANAGEMENT NOTE 08/14/2013  Patient:  Cody Suarez, Cody Suarez   Account Number:  [id number]  Date Initiated:  08/09/2013  Documentation initiated by:  HUTCHINSON,CRYSTAL  Subjective/Objective Assessment:   PAD     Action/Plan:   CM to follow for disposition needs   Anticipated DC Date:  08/14/2013   Anticipated DC Plan:  [place]  CM consult  Medication Assistance      [facility] Choice  HOME HEALTH   Choice offered to / List presented to:  C-4 Adult Children   DME arranged  [place]      DME agency  [place] arranged  [place].   Status of service:  Completed, signed off Medicare Important Message given?   (If response is "NO", the following Medicare IM given date fields will be blank) Date Medicare IM given:   Medicare IM given by:   Date Additional Medicare IM given:   Additional Medicare IM given by:    Discharge Disposition:  HOME/SELF CARE  Per UR Regulation:  Reviewed for med. necessity/level of care/duration of stay  If discussed at [place] of Stay Meetings, dates discussed:    Comments:  Crystal Hutchinson RN, BSN, MSHL, CCM  Nurse - Case Manager, (Unit [phone]  08/09/2013   08/14/13 Ellan Lambert, RN, BSN [phone]  per [facility] [place] online:  xarelto is a tier 2 medication/ covered/ on drug list/ co-pay for 30 day supply at retail $40.00 Pt given copay card for Xarelto free x 1 year.  Pt requests RW for home; referral to [facility] for DME needs.  [place] delivered to room prior to dc.  Pt also needs HH follow up for PT and OT...referral to [facility], per family choice.  Start of care 24-48h post dc date.   Evaluated by Dr. 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 Dispostion Plan:  pending.

## 2013-08-09 NOTE — Consult Note (Signed)
Vascular Surgery Consultation  Reason for Consult: Multiple emboli to both lower extremities 10 weeks ago  HPI: Cody Suarez is a 68 y.o. male who presents for evaluation of bilateral calf claudication. 10 weeks ago patient awoke with calf discomfort bilaterally and since then has had severe calf claudication right equal to left. He has had no rest pain infection or nonhealing ulcers. He has no history of atrial fibrillation or atrial arrhythmias. He did have colon cancer treated in March of 2015 with colectomy and has had chemotherapy since that time. He is doing well from that standpoint. He had no previous history of claudication. He has no smoking history. He denies diabetes mellitus. Today he was evaluated by Dr. Audelia Acton with angiography which reveals diffuse thrombus in tibial vessels bilaterally a surgical consultation was obtained.   Past Medical History  Diagnosis Date  . Diarrhea   . Hypertension   . History of kidney stones     x 2 episodes, none recent  . Arthritis     arthritis thumb joints  . Cancer     dx.colon cancer after colonoscopy   . Ulcerative colitis     history   Past Surgical History  Procedure Laterality Date  . Hernia repair      umbilical hernia repair  . Tonsillectomy    . Vasectomy    . Laparoscopic right hemi colectomy N/A 03/28/2013    Procedure: LAPAROSCOPIC ASSISTED HEMI COLECTOMY;  Surgeon: Pedro Earls, MD;  Location: [facility];  Service: General;  Laterality: N/A;  . Colon surgery     History   Social History  . Marital Status: Married    Spouse Name: N/A    Number of Children: N/A  . Years of Education: N/A   Social History Main Topics  . Smoking status: Never Smoker   . Smokeless tobacco: Not on file  . Alcohol Use: Yes     Comment: occ. x3 glasses week  . Drug Use: No  . Sexual Activity: Yes   Other Topics Concern  . Not on file   Social History Narrative  . No narrative on file   Family History  Problem Relation Age of  Onset  . Heart disease Mother    No Known Allergies Prior to Admission medications   Medication Sig Start Date End Date Taking? Authorizing Provider  acetaminophen (TYLENOL) 500 MG tablet Take 1,000 mg by mouth 3 (three) times daily as needed (pain).   Yes Historical Provider, MD  aspirin EC 81 MG tablet Take 81 mg by mouth daily.   Yes Historical Provider, MD  Budesonide (UCERIS) 9 MG TB24 Take 9 mg by mouth daily.    Yes Historical Provider, MD  capecitabine (XELODA) 500 MG tablet Take 1,500-2,000 mg by mouth See admin instructions. Take 4 tablets (2000 mg) every morning and 3 tablets (1500 mg) every night for 14 days, hold for 7 days, then repeat   Yes Historical Provider, MD  Cyanocobalamin (VITAMIN B-12 SL) Place 1 tablet under the tongue daily.   Yes Historical Provider, MD  HYDROcodone-acetaminophen (NORCO/VICODIN) 5-325 MG per tablet Take 1 tablet by mouth 2 (two) times daily as needed for moderate pain.   Yes Historical Provider, MD  potassium chloride SA (K-DUR,KLOR-CON) 20 MEQ tablet Take 1 tablet (20 mEq total) by mouth daily. 07/07/13  Yes [name], MD  sildenafil (VIAGRA) 50 MG tablet Take 50 mg by mouth daily as needed for erectile dysfunction.   Yes Historical Provider, MD  Positive ROS: Denies chest pain, dyspnea on exertion, PND, orthopnea, hemoptysis, lateralizing weakness, aphasia, amaurosis fugax, diplopia, blurred vision, syncope. Other than recent surgery for colon cancer all systems are negative and a complete review of systems  All other systems have been reviewed and were otherwise negative with the exception of those mentioned in the HPI and as above.  Physical Exam: Filed Vitals:   08/09/13 1235  BP: 212/76  Pulse: 64  Temp:   Resp:     General: Alert, no acute distress HEENT: Normal for age Cardiovascular: Regular rate and rhythm. Carotid pulses 2+, no bruits audible Respiratory: Clear to auscultation. No cyanosis, no use of accessory  musculature GI: No organomegaly, abdomen is soft and non-tender fashion midline incision well-healed Skin: No lesions in the area of chief complaint Neurologic: Sensation intact distally Psychiatric: Patient is competent for consent with normal mood and affect Musculoskeletal: No obvious deformities Extremities: Both lower extremities with 3+ femoral and 2-3+ popliteal pulses palpable. No distal pulses palpable. Both feet are slightly pale but adequately perfused. No calf tenderness.    Imaging reviewed: Reviewed angiography with Dr. Audelia Acton. 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 There is occlusion of proximal peroneal artery with reconstitution in the lower two thirds of the leg. Left leg reveals old thrombus in distal profunda femoris artery. There is some slight thrombus in superficial femoral artery above the knee which is not flow-limiting. There is total occlusion of the anterior and posterior tibial arteries on the left near the origin with patent peroneal artery which has thrombus extending down 5-6 cm distal vessel appears relatively normal.   Assessment/Plan:  #1 patient will be hospitalized and cardiac evaluation today and tomorrow #2 plan exploration right popliteal artery with probable popliteal peroneal bypass using saphenous vein graft on Friday, August 7. Discussed the fact that this may not be successful in that likely will not be able to get thrombus out of anterior and posterior tibial arteries after 10 weeks. We'll schedule surgery for Friday morning   Tinnie Gens, MD 08/09/2013 1:02 PM

## 2013-08-09 NOTE — CV Procedure (Addendum)
PERIPHERAL VASCULAR PROCEDURE  NAME:  AUTREY HUMAN   MRN: [id number] DOB:  04-30-1945   ADMIT DATE: 08/09/2013  Performing Cardiologist: Kathlyn Sacramento Primary Physician: Florina Ou, MD  Procedures Performed:  Abdominal Aortic Angiogram with Bi-Iliofemoral Runoff  Bilateral Lower Extremity Angiography (1st Order)    Indication(s):   Claudication    Consent: The procedure with Risks/Benefits/Alternatives and Indications was reviewed with the patient .  All questions were answered.  Medications:  Sedation:  1 mg IV Versed, 25 mcg IV Fentanyl  Contrast:  144 ml  Visipaque   Procedural details: The right groin was prepped, draped, and anesthetized with 1% lidocaine. Using modified Seldinger technique, a 5 French sheath was introduced into the right common femoral artery. A 5 Fr Short Pigtail Catheter was advanced of over a  Versicore wire into the descending Aorta to a level just above the renal arteries. A power injection of 95m/sec contrast over 1 sec was performed for Abdominal Aortic Angiography.  The catheter was then pulled back to a level just above the Aortic bifurcation, and a second power injection was performed to evaluate the iliac arteries.   Bilateral lower extremity arterial run off was then performed via power injection of 7 ml / sec contrast for a total of 77 ml.  The catheter was then removed. At this time the injector was directed to the sheath SideArm and ipsilateral below the knee artery angiography was performed via power injection .   The patient tolerated the procedure well with no immediate complications.    Hemodynamics:  Central Aortic Pressure / Mean Aortic Pressure: 179/68  Findings:  Abdominal aorta: Normal with no evidence of aneurysm.  Left renal artery: Normal  Right renal artery: Normal  Celiac artery: Patent  Superior mesenteric artery: Normal  Right common iliac artery: Normal  Right internal iliac artery: Normal  Right  external iliac artery: Normal  Right common femoral artery: Normal  Right profunda femoral artery: Normal proximally with occlusion distally likely due to embolization.  Right superficial femoral artery: Normal  Right popliteal artery: Normal proximally but then is occluded distally.  This seems to be only 1 vessel runoff below the knee via collaterals reconstituting that peroneal artery.  Left common iliac artery:  Normal  Left internal iliac artery: Normal  Left external iliac artery: Normal  Left common femoral artery: Normal   Left profunda femoral artery: normal proximally with occlusion distally with evidence of organized thrombus.  Left superficial femoral artery:  Normal  Left popliteal artery: Normal  Left tibial peroneal trunk: Normal  Left anterior tibial artery: Occluded proximally  Left peroneal artery: Occluded proximally  Left posterior tibial artery: Occluded proximally.   Reconstitution via collaterals into the peroneal artery which seems to be the only patent vessel to the foot.  Conclusions: 1. No significant aortoiliac disease. 2. 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 This is highly suggestive of distal embolization. One-vessel runoff below the knee bilaterally via the peroneal artery which is occluded proximally but does reconstitute via collaterals.  Recommendations:  We need to evaluate for source of embolization. A TEE  telemetry is recommended. Hypercoagulable workup is recommended although his colon cancer might be the culprit.  Due to presence of thrombus and severe disease, I recommend hospital admission. Start IV Heparin until source of embolization is evaluated.  Get TTE today and likely TEE tomorrow. IF EF is normal, no need for ischemic cardiac  work before surgery given lack of anginal symptoms and risk factors.  I consulted Dr. Kellie Simmering and discussed the case with him.     Kathlyn Sacramento, MD, [facility] 08/09/2013 11:38 AM

## 2013-08-09 NOTE — Progress Notes (Signed)
Called report to [facility]. Dr Fletcher Anon was paged/ returned call, Dr Fletcher Anon will  cancel short stay DC orders and place admit orders.

## 2013-08-09 NOTE — H&P (View-Only) (Signed)
HPI  This is a 68 year old pleasant man who was referred for evaluation and management of peripheral arterial disease. He has no previous cardiac history and no chronic medical conditions. Specifically, he is not diabetic and does not smoke. He was diagnosed with stage II colon cancer early this year and underwent partial colectomy in February followed by chemotherapy with Xeloda which is still ongoing. He started having bilateral calf discomfort which started about 2-3 months ago with significant worsening. This is now happening with walking about 100 feet and forces him to stop and rest for a few minutes before he can resume. There is no rest pain and no lower extremity ulceration. He underwent lower extremity venous duplex which showed no evidence of DVT. ABI was moderately to severely reduced bilaterally at 0.52 on the right and 0.6 on the left.    No Known Allergies   Current Outpatient Prescriptions on File Prior to Visit  Medication Sig Dispense Refill  . aspirin 81 MG tablet Take 81 mg by mouth daily.      . Budesonide (UCERIS) 9 MG TB24 Take 9 mg by mouth every morning.      . capecitabine (XELODA) 500 MG tablet Take 4 tabs (2,000 mg) QAM 3 tabs (1,500 mg) QPM (3,500 mg total daily) for 14 days, then 7 days off.  98 tablet  1  . HYDROcodone-acetaminophen (NORCO/VICODIN) 5-325 MG per tablet Take 1-2 tablets by mouth every 6 (six) hours as needed for moderate pain.  30 tablet  0  . potassium chloride SA (K-DUR,KLOR-CON) 20 MEQ tablet Take 1 tablet (20 mEq total) by mouth daily.  30 tablet  3  . VIAGRA 50 MG tablet Take 50 mg by mouth as needed for erectile dysfunction.        No current facility-administered medications on file prior to visit.     Past Medical History  Diagnosis Date  . Diarrhea   . Hypertension   . History of kidney stones     x 2 episodes, none recent  . Arthritis     arthritis thumb joints  . Cancer     dx.colon cancer after colonoscopy   . Ulcerative  colitis     history     Past Surgical History  Procedure Laterality Date  . Hernia repair      umbilical hernia repair  . Tonsillectomy    . Vasectomy    . Laparoscopic right hemi colectomy N/A 03/28/2013    Procedure: LAPAROSCOPIC ASSISTED HEMI COLECTOMY;  Surgeon: Pedro Earls, MD;  Location: [facility];  Service: General;  Laterality: N/A;  . Colon surgery       Family History  Problem Relation Age of Onset  . Heart disease Mother      History   Social History  . Marital Status: Married    Spouse Name: N/A    Number of Children: N/A  . Years of Education: N/A   Occupational History  . Not on file.   Social History Main Topics  . Smoking status: Never Smoker   . Smokeless tobacco: Not on file  . Alcohol Use: Yes     Comment: occ. x3 glasses week  . Drug Use: No  . Sexual Activity: Yes   Other Topics Concern  . Not on file   Social History Narrative  . No narrative on file     ROS A 10 point review of system was performed. It is negative other than that mentioned in the history of  present illness.   PHYSICAL EXAM   BP 162/71  Pulse 79  Ht 5' 10"  (1.778 m)  Wt 165 lb 1.9 oz (74.898 kg)  BMI 23.69 kg/m2 Constitutional: He is oriented to person, place, and time. He appears well-developed and well-nourished. No distress.  HENT: No nasal discharge.  Head: Normocephalic and atraumatic.  Eyes: Pupils are equal and round.  No discharge. Neck: Normal range of motion. Neck supple. No JVD present. No thyromegaly present. No carotid bruits Cardiovascular: Normal rate, regular rhythm, normal heart sounds. Exam reveals no gallop and no friction rub. No murmur heard.  Pulmonary/Chest: Effort normal and breath sounds normal. No stridor. No respiratory distress. He has no wheezes. He has no rales. He exhibits no tenderness.  Abdominal: Soft. Bowel sounds are normal. He exhibits no distension. There is no tenderness. There is no rebound and no guarding.    Musculoskeletal: Normal range of motion. He exhibits no edema and no tenderness.  Neurological: He is alert and oriented to person, place, and time. Coordination normal.  Skin: Skin is warm and dry. No rash noted. He is not diaphoretic. No erythema. No pallor.  Psychiatric: He has a normal mood and affect. His behavior is normal. Judgment and thought content normal.  Vascular: Radial pulses are normal bilaterally. Femoral pulses are normal bilaterally. Distal pulses are not palpable.     ASSESSMENT AND PLAN

## 2013-08-09 NOTE — Interval H&P Note (Signed)
History and Physical Interval Note:  08/09/2013 11:02 AM  Cody Suarez  has presented today for surgery, with the diagnosis of pad  The various methods of treatment have been discussed with the patient and family. After consideration of risks, benefits and other options for treatment, the patient has consented to  Procedure(s): ABDOMINAL AORTAGRAM (N/A) as a surgical intervention .  The patient's history has been reviewed, patient examined, no change in status, stable for surgery.  I have reviewed the patient's chart and labs.  Questions were answered to the patient's satisfaction.     Kathlyn Sacramento

## 2013-08-09 NOTE — Progress Notes (Signed)
ANTICOAGULATION CONSULT NOTE - Initial Consult  Pharmacy Consult for heparin Indication: Multiple emboli to both lower extremities  No Known Allergies  Patient Measurements: Height: 5' 10"  (177.8 cm) Weight: 165 lb (74.844 kg) IBW/kg (Calculated) : 73 Heparin Dosing Weight: 74.8 kg  Vital Signs: Temp: 98 F (36.7 C) (08/05 0757) Temp src: Oral (08/05 0757) BP: 145/74 mmHg (08/05 1425) Pulse Rate: 69 (08/05 1425)  Labs: No results found for this basename: HGB, HCT, PLT, APTT, LABPROT, INR, HEPARINUNFRC, CREATININE, CKTOTAL, CKMB, TROPONINI,  in the last 72 hours  Estimated Creatinine Clearance: 81.1 ml/min (by C-G formula based on Cr of 0.9).   Medical History: Past Medical History  Diagnosis Date  . Diarrhea   . Hypertension   . History of kidney stones     x 2 episodes, none recent  . Arthritis     arthritis thumb joints  . Cancer     dx.colon cancer after colonoscopy   . Ulcerative colitis     history    Medications:  Scheduled:  . amLODipine  5 mg Oral Daily  . aspirin  324 mg Oral NOW   Or  . aspirin  300 mg Rectal NOW  . budesonide  9 mg Oral Daily  . capecitabine  1,500-2,000 mg Oral See admin instructions  . potassium chloride SA  20 mEq Oral Daily  . sodium chloride  3 mL Intravenous Q12H   Infusions:  . sodium chloride 100 mL/hr at 08/09/13 1204  . sodium chloride      Assessment: 68 yo male with multiple emboli to both lower extremities will be started on heparin therapy.  Per note, will probably have popliteal peroneal bypass this Friday.  Hgb 10.4, Plt 379 K and INR 1 on 08/01/13  Goal of Therapy:  Heparin level 0.3-0.7 units/ml Monitor platelets by anticoagulation protocol: Yes   Plan:  1) Heparin 3500 units iv bolus x1, then start heparin drip at 1100 units/hr 2) 6hr heparin level after drip is started 3) Daily heparin level and CBC  Deitrich Steve, Tsz-Yin 08/09/2013,3:08 PM

## 2013-08-09 NOTE — Progress Notes (Signed)
Site area: right groin 5 fr sheath removed Site Prior to Removal:  Level 0 Pressure Applied For: 20 minutes  Manual:   yes Patient Status During Pull:  No complications  Post Pull Site:  Level 0 Post Pull Instructions Given:  Yes to pt and wife  Post Pull Pulses Present: doppler right pt Dressing Applied:  tegaderm  Bedrest begins @ 1230 Comments: Dr Kellie Simmering in to speak with pt

## 2013-08-10 ENCOUNTER — Encounter (HOSPITAL_COMMUNITY)
Admission: RE | Disposition: A | Discharge status: Home / Self Care | Payer: Self-pay | Source: Ambulatory Visit | Attending: Cardiovascular Disease

## 2013-08-10 ENCOUNTER — Inpatient Hospital Stay (HOSPITAL_COMMUNITY): Payer: BC Managed Care – PPO

## 2013-08-10 ENCOUNTER — Encounter (HOSPITAL_COMMUNITY): Payer: Self-pay

## 2013-08-10 DIAGNOSIS — I059 Rheumatic mitral valve disease, unspecified: Secondary | ICD-10-CM

## 2013-08-10 DIAGNOSIS — I517 Cardiomegaly: Secondary | ICD-10-CM

## 2013-08-10 DIAGNOSIS — I739 Peripheral vascular disease, unspecified: Secondary | ICD-10-CM

## 2013-08-10 DIAGNOSIS — Z01810 Encounter for preprocedural cardiovascular examination: Secondary | ICD-10-CM

## 2013-08-10 DIAGNOSIS — I749 Embolism and thrombosis of unspecified artery: Secondary | ICD-10-CM

## 2013-08-10 HISTORY — PX: TEE WITHOUT CARDIOVERSION: SHX5443

## 2013-08-10 LAB — COMPREHENSIVE METABOLIC PANEL
ALT: 10 U/L (ref 0–53)
AST: 13 U/L (ref 0–37)
Albumin: 3.3 g/dL — ABNORMAL LOW (ref 3.5–5.2)
Alkaline Phosphatase: 64 U/L (ref 39–117)
Anion gap: 11 (ref 5–15)
BUN: 13 mg/dL (ref 6–23)
CO2: 24 mEq/L (ref 19–32)
Calcium: 8.3 mg/dL — ABNORMAL LOW (ref 8.4–10.5)
Chloride: 107 mEq/L (ref 96–112)
Creatinine, Ser: 0.89 mg/dL (ref 0.50–1.35)
GFR calc Af Amer: >90 mL/min (ref >90)
GFR calc non Af Amer: 86 mL/min — ABNORMAL LOW (ref >90)
Glucose, Bld: 99 mg/dL (ref 70–99)
Potassium: 4.4 mEq/L (ref 3.7–5.3)
Sodium: 142 mEq/L (ref 137–147)
Total Bilirubin: 0.7 mg/dL (ref 0.3–1.2)
Total Protein: 6.3 g/dL (ref 6.0–8.3)

## 2013-08-10 LAB — CBC
HCT: 35.3 % — ABNORMAL LOW (ref 39.0–52.0)
Hemoglobin: 11 g/dL — ABNORMAL LOW (ref 13.0–17.0)
MCH: 26.2 pg (ref 26.0–34.0)
MCHC: 31.2 g/dL (ref 30.0–36.0)
MCV: 84 fL (ref 78.0–100.0)
Platelets: 278 10*3/uL (ref 150–400)
RBC: 4.2 MIL/uL — ABNORMAL LOW (ref 4.22–5.81)
RDW: 32.4 % — ABNORMAL HIGH (ref 11.5–15.5)
WBC: 9.1 10*3/uL (ref 4.0–10.5)

## 2013-08-10 LAB — PROTIME-INR
INR: 1.1 (ref 0.00–1.49)
Prothrombin Time: 14.2 seconds (ref 11.6–15.2)

## 2013-08-10 LAB — LIPID PANEL
Cholesterol: 141 mg/dL (ref 0–200)
HDL: 59 mg/dL (ref >39)
LDL Cholesterol: 65 mg/dL (ref 0–99)
Total CHOL/HDL Ratio: 2.4 RATIO
Triglycerides: 85 mg/dL (ref <150)
VLDL: 17 mg/dL (ref 0–40)

## 2013-08-10 LAB — HEPARIN LEVEL (UNFRACTIONATED)
Heparin Unfractionated: 0.15 IU/mL — ABNORMAL LOW (ref 0.30–0.70)
Heparin Unfractionated: 0.7 IU/mL (ref 0.30–0.70)
Heparin Unfractionated: 0.26 IU/mL — ABNORMAL LOW (ref 0.30–0.70)

## 2013-08-10 SURGERY — ECHOCARDIOGRAM, TRANSESOPHAGEAL
Anesthesia: Moderate Sedation

## 2013-08-10 MED ORDER — MIDAZOLAM HCL 10 MG/2ML IJ SOLN
INTRAMUSCULAR | Status: DC | PRN
  Administered 2013-08-10: 2 mg via INTRAVENOUS
  Administered 2013-08-10: 1 mg via INTRAVENOUS

## 2013-08-10 MED ORDER — FENTANYL CITRATE 0.05 MG/ML IJ SOLN
INTRAMUSCULAR | Status: DC | PRN
  Administered 2013-08-10 (×2): 25 ug via INTRAVENOUS

## 2013-08-10 MED ORDER — IOHEXOL 350 MG/ML SOLN
100.0000 mL | Freq: Once | INTRAVENOUS | Status: AC | PRN
  Administered 2013-08-10: 11:00:00 100 mL via INTRAVENOUS

## 2013-08-10 MED ORDER — HEPARIN BOLUS VIA INFUSION
2000.0000 [IU] | Freq: Once | INTRAVENOUS | Status: AC
  Administered 2013-08-10: 07:00:00 2000 [IU] via INTRAVENOUS
  Filled 2013-08-10: qty 2000

## 2013-08-10 MED ORDER — CEFAZOLIN SODIUM 1-5 GM-% IV SOLN
1.0000 g | INTRAVENOUS | Status: AC
  Administered 2013-08-11: 2 g via INTRAVENOUS
  Filled 2013-08-10: qty 50

## 2013-08-10 MED ORDER — LIDOCAINE VISCOUS 2 % MT SOLN
OROMUCOSAL | Status: AC
Start: 2013-08-10 — End: 2013-08-10
  Filled 2013-08-10: qty 15

## 2013-08-10 MED ORDER — BUTAMBEN-TETRACAINE-BENZOCAINE 2-2-14 % EX AERO
INHALATION_SPRAY | CUTANEOUS | Status: DC | PRN
  Administered 2013-08-10: 2 via TOPICAL

## 2013-08-10 MED ORDER — BUDESONIDE 9 MG PO TB24
9.0000 mg | ORAL_TABLET | Freq: Every day | ORAL | Status: DC
  Administered 2013-08-10: 9 mg via ORAL
  Filled 2013-08-10: qty 9

## 2013-08-10 MED ORDER — FENTANYL CITRATE 0.05 MG/ML IJ SOLN
INTRAMUSCULAR | Status: AC
  Filled 2013-08-10: qty 2

## 2013-08-10 MED ORDER — MIDAZOLAM HCL 5 MG/ML IJ SOLN
INTRAMUSCULAR | Status: AC
  Filled 2013-08-10: qty 2

## 2013-08-10 NOTE — Progress Notes (Signed)
*  PRELIMINARY RESULTS* Echocardiogram 2D Echocardiogram has been performed.  Leavy Cella 08/10/2013, 12:55 PM

## 2013-08-10 NOTE — Progress Notes (Signed)
ANTICOAGULATION CONSULT NOTE - Follow Up Consult  Pharmacy Consult for heparin Indication: multiple emboli for both lower extremities  No Known Allergies  Patient Measurements: Height: 5' 10"  (177.8 cm) Weight: 162 lb 14.7 oz (73.9 kg) IBW/kg (Calculated) : 73 Heparin Dosing Weight: 74.8 kg  Vital Signs: Temp: 98.4 F (36.9 C) (08/06 0959) Temp src: Oral (08/06 0959) BP: 186/67 mmHg (08/06 0959) Pulse Rate: 72 (08/06 1003)  Labs:  Recent Labs  08/09/13 1548 08/10/13 0422 08/10/13 1248  HGB  --  11.0*  --   HCT  --  35.3*  --   PLT  --  278  --   LABPROT 16.1* 14.2  --   INR 1.29 1.10  --   HEPARINUNFRC  --  0.15* 0.70  CREATININE  --  0.89  --     Estimated Creatinine Clearance: 82 ml/min (by C-G formula based on Cr of 0.89).   Medications:  Scheduled:  . amLODipine  5 mg Oral Daily  . aspirin  324 mg Oral NOW   Or  . aspirin  300 mg Rectal NOW  . Budesonide  9 mg Oral Daily  . capecitabine  1,500 mg Oral QAC supper  . capecitabine  2,000 mg Oral Q breakfast  . [START ON 08/11/2013]  ceFAZolin (ANCEF) IV  1 g Intravenous On Call  . potassium chloride SA  20 mEq Oral Daily  . sodium chloride  3 mL Intravenous Q12H   Infusions:  . sodium chloride 500 mL (08/10/13 0840)  . heparin 1,300 Units/hr (08/10/13 0700)    Assessment: 68 yo male with multiple emboli for both lower extremities is currently on therapeutic heparin. Heparin level is on the higher side at 0.7.   Goal of Therapy:  Heparin level 0.3-0.7 units/ml Monitor platelets by anticoagulation protocol: Yes   Plan:  1) Reduce heparin to 1200 units/hr and recheck heparin level in 6 hrs to reassess  Antavius Sperbeck, Tsz-Yin 08/10/2013,2:12 PM

## 2013-08-10 NOTE — Progress Notes (Signed)
ANTICOAGULATION CONSULT NOTE - Follow Up Consult  Pharmacy Consult for heparin Indication: Multiple emboli to bilateral lower extremities.  No Known Allergies  Patient Measurements: Height: 5' 10"  (177.8 cm) Weight: 162 lb 14.7 oz (73.9 kg) IBW/kg (Calculated) : 73 Heparin Dosing Weight:   Vital Signs: Temp: 97.6 F (36.4 C) (08/06 2005) Temp src: Oral (08/06 2005) BP: 151/68 mmHg (08/06 2005) Pulse Rate: 88 (08/06 2005)  Labs:  Recent Labs  08/09/13 1548 08/10/13 0422 08/10/13 1248 08/10/13 2115  HGB  --  11.0*  --   --   HCT  --  35.3*  --   --   PLT  --  278  --   --   LABPROT 16.1* 14.2  --   --   INR 1.29 1.10  --   --   HEPARINUNFRC  --  0.15* 0.70 0.26*  CREATININE  --  0.89  --   --     Estimated Creatinine Clearance: 82 ml/min (by C-G formula based on Cr of 0.89).   Medications:  Scheduled:  . amLODipine  5 mg Oral Daily  . Budesonide  9 mg Oral Daily  . capecitabine  1,500 mg Oral QAC supper  . capecitabine  2,000 mg Oral Q breakfast  . [START ON 08/11/2013]  ceFAZolin (ANCEF) IV  1 g Intravenous On Call  . potassium chloride SA  20 mEq Oral Daily  . sodium chloride  3 mL Intravenous Q12H    Assessment: 68 yr old male with multiple emboli to the lower extremities. He is for surgery (Vascular) on 8/7. Heparin level was 0.7 on 1300 units/hr and then 0.26 on 1200 units. Will split the middle and change heparin to 1250 units/hr. Goal of Therapy:  Heparin level 0.3-0.7 units/ml Monitor platelets by anticoagulation protocol: Yes   Plan:  Will change the heparin rate to 1250 units/hr and check HL in the AM Pt for vascular surgery tomorrow.  Minta Balsam 08/10/2013,10:06 PM

## 2013-08-10 NOTE — Progress Notes (Signed)
Verified surgical procedure schedule  with Dr Trula Slade ( on call for Dr Kellie Simmering), NPO post midnight ordered, Dr Kellie Simmering to see pt in am per Md

## 2013-08-10 NOTE — CV Procedure (Signed)
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  Likely source resulting in distal embolization (10 weeks ago).   Cardiac findings (normal EF, no intracardiac thrombotic source).   Discussed with Dr. Fletcher Anon and Dr. Kellie Simmering.   Proceeding with CTA of aorta (ascending and descending).  Continue IV heparin.   Candee Furbish, MD

## 2013-08-10 NOTE — Progress Notes (Signed)
Patient ID: Cody Suarez, male   DOB: 04-03-1945, 68 y.o.   MRN: [id number] Vascular Surgery Progress Note  Subjective: Multiple emboli to both lower extremities-10 weeks ago-needs revascularization  Objective:  Filed Vitals:   08/10/13 1003  BP:   Pulse: 72  Temp:   Resp: 18    General well-developed well-nourished male in no apparent stress Alert and oriented x3 Both lower extremities with adequate perfusion.  Today echocardiogram revealed mobile thrombus in descending thoracic aorta CT angiogram of the thoracic aorta and abdominal aorta pending   Labs:  Recent Labs Lab 08/10/13 0422  CREATININE 0.89    Recent Labs Lab 08/10/13 0422  NA 142  K 4.4  CL 107  CO2 24  BUN 13  CREATININE 0.89  GLUCOSE 99  CALCIUM 8.3*    Recent Labs Lab 08/10/13 0422  WBC 9.1  HGB 11.0*  HCT 35.3*  PLT 278    Recent Labs Lab 08/09/13 1548 08/10/13 0422  INR 1.29 1.10    I/O last 3 completed shifts: In: 362.6 [P.O.:240; I.V.:122.6] Out: 2325 [Urine:2325]  Imaging: No results found.  Assessment/Pla  LOS: 1 day  s/p Procedure(s): TRANSESOPHAGEAL ECHOCARDIOGRAM (TEE)  The thrombus in the descending thoracic aorta is likely source of a lot of both lower extremities. Decision will need to be made regarding chronic anticoagulation versus covering this with drastic stent graft which would have some risk of dislodging thrombus Dr. Trula Slade Will review CT angiogram later today Will continue with same plan for revascularization right leg tomorrow with thrombectomy plus or minus popliteal to peroneal bypass Discuss this with patient and he is in agreement and understands about the potential source of the emboli to both legs Will preop patient for tomorrow with surgery scheduled approximately 10 AM   Tinnie Gens, MD 08/10/2013 11:36 AM

## 2013-08-10 NOTE — Progress Notes (Signed)
    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 There is occlusion of proximal peroneal artery with reconstitution in the lower two thirds of the leg.   LE Arterial and LE Venous Duplex Evaluation  Summary:  - Bilateral lower extremities exhibit 20-49% stenosis by velocity criteria, and 50-99% stenosis by waveform criteria.  The right ABI is suggestive of moderate, borderline severe, arterial insufficiency. The left ABI is suggestive of moderate arterial insufficiency. - No evidence of deep vein thrombosis involving the right lower extremity and left lower extremity. - No evidence of Baker&'s cyst on the right or left.   Pending cardiac work up.  Pre-op orders have been placed for right popliteal artery with probable popliteal peroneal bypass tomorrow by Dr. Tinnie Gens.  Decarlos Empey MAUREEN PA-C

## 2013-08-10 NOTE — Interval H&P Note (Signed)
History and Physical Interval Note:  08/10/2013 9:13 AM  Cody Suarez  has presented today for surgery, with the diagnosis of arterial clot  The various methods of treatment have been discussed with the patient and family. After consideration of risks, benefits and other options for treatment, the patient has consented to  Procedure(s): TRANSESOPHAGEAL ECHOCARDIOGRAM (TEE) (N/A) as a surgical intervention .  The patient's history has been reviewed, patient examined, no change in status, stable for surgery.  I have reviewed the patient's chart and labs.  Questions were answered to the patient's satisfaction.     SKAINS, MARK

## 2013-08-10 NOTE — Progress Notes (Signed)
   SUBJECTIVE:  Big toe pain. No other complaints.  Tolerating IV heparin.  OBJECTIVE:   Vitals:   Filed Vitals:   08/10/13 0945 08/10/13 0950 08/10/13 0959 08/10/13 1003  BP: 176/78 193/80 186/67   Pulse: 64 72 67 72  Temp:   98.4 F (36.9 C)   TempSrc:   Oral   Resp: 15 16 13 18   Height:      Weight:      SpO2: 99% 99% 98% 98%   I&O's:   Intake/Output Summary (Last 24 hours) at 08/10/13 1011 Last data filed at 08/10/13 0759  Gross per 24 hour  Intake 362.57 ml  Output   2575 ml  Net -2212.43 ml   TELEMETRY: Reviewed telemetry pt in NSR:     PHYSICAL EXAM General: Well developed, well nourished, in no acute distress Head:   Normal cephalic and atramatic  Lungs:   Clear bilaterally to auscultation. Heart:  HRRR S1 S2  Abdomen: abdomen soft and non-tender Msk:  Back normal,  Normal strength and tone for age. Extremities:   No edema.   Neuro: Alert and oriented. Psych:  Normal affect, responds appropriately   LABS: Basic Metabolic Panel:  Recent Labs  08/10/13 0422  NA 142  K 4.4  CL 107  CO2 24  GLUCOSE 99  BUN 13  CREATININE 0.89  CALCIUM 8.3*   Liver Function Tests:  Recent Labs  08/10/13 0422  AST 13  ALT 10  ALKPHOS 64  BILITOT 0.7  PROT 6.3  ALBUMIN 3.3*   No results found for this basename: LIPASE, AMYLASE,  in the last 72 hours CBC:  Recent Labs  08/10/13 0422  WBC 9.1  HGB 11.0*  HCT 35.3*  MCV 84.0  PLT 278   Cardiac Enzymes: No results found for this basename: CKTOTAL, CKMB, CKMBINDEX, TROPONINI,  in the last 72 hours BNP: No components found with this basename: POCBNP,  D-Dimer: No results found for this basename: DDIMER,  in the last 72 hours Hemoglobin A1C: No results found for this basename: HGBA1C,  in the last 72 hours Fasting Lipid Panel:  Recent Labs  08/10/13 0422  CHOL 141  HDL 59  LDLCALC 65  TRIG 85  CHOLHDL 2.4   Thyroid Function Tests: No results found for this basename: TSH, T4TOTAL, FREET3,  T3FREE, THYROIDAB,  in the last 72 hours Anemia Panel: No results found for this basename: VITAMINB12, FOLATE, FERRITIN, TIBC, IRON, RETICCTPCT,  in the last 72 hours Coag Panel:   Lab Results  Component Value Date   INR 1.10 08/10/2013   INR 1.29 08/09/2013   INR 1.0 08/01/2013    RADIOLOGY: No results found.    ASSESSMENT: PAD-? Embolic disease  PLAN:  TEE today to look for cardiac source. IV heparin. Surgery tomorrow per Dr. Kellie Simmering. Pain control meds as needed.  Jettie Booze., MD  08/10/2013  10:11 AM

## 2013-08-10 NOTE — Progress Notes (Signed)
Right Lower Extremity Vein Map    Right Great Saphenous Vein   Segment Diameter Comment  1. Origin 5.36m   2. High Thigh 1.730m  3. Mid Thigh 1.4131m 4. Low Thigh 1.45m76m5. At Knee  Unable to visualize.  6. High Calf  Unable to visualize.  7. Low Calf  Unable to visualize.  8. Ankle  Unable to visualize.                Right Small Saphenous Vein  Segment Diameter Comment  1. Origin 1.75mm75m. High Calf 1.83mm 66m Low Calf 2.37mm M57mple branches.  4. Ankle 2.78mm   38m          Left Lower Extremity Vein Map    Left Great Saphenous Vein   Segment Diameter Comment  1. Origin 4.46mm   261mgh Thigh  Unable to visualize.  3. Mid Thigh  Unable to visualize.  4. Low Thigh  Unable to visualize.  5. At Knee  Unable to visualize.  6. High Calf  Unable to visualize.  7. Low Calf  Unable to visualize.  8. Ankle  Unable to visualize.                Left Small Saphenous Vein  Segment Diameter Comment  High Calf 4.61mm   Lo67mlf 3.64mm   Ank57m.53mm       16m     08/10/2013 1:48 PM Rice Walsh, RVT, RDCS, RDMS

## 2013-08-10 NOTE — Progress Notes (Signed)
Echo Lab  Transesophageal Echocardiogram completed.  [place], RDCS 08/10/2013 10:06 AM

## 2013-08-10 NOTE — Progress Notes (Signed)
ANTICOAGULATION CONSULT NOTE - Follow Up Consult  Pharmacy Consult for Heparin  Indication: Multiple emboli to bilateral lower extremities (vascular surgery)  No Known Allergies  Patient Measurements: Height: 5' 10"  (177.8 cm) Weight: 162 lb 14.7 oz (73.9 kg) IBW/kg (Calculated) : 73  Vital Signs: Temp: 97.8 F (36.6 C) (08/06 0416) Temp src: Oral (08/06 0416) BP: 171/68 mmHg (08/06 0416) Pulse Rate: 77 (08/06 0416)  Labs:  Recent Labs  08/09/13 1548 08/10/13 0422  HGB  --  11.0*  HCT  --  35.3*  PLT  --  278  LABPROT 16.1* 14.2  INR 1.29 1.10  HEPARINUNFRC  --  0.15*    Estimated Creatinine Clearance: 81.1 ml/min (by C-G formula based on Cr of 0.9).   Medications:  Heparin 1100 units/hr  Assessment: 68 y/o M with sub-therapeutic heparin level (HL 0.15) Vascular surgery following, other labs as above. No issues per RN.   Goal of Therapy:  Heparin level 0.3-0.7 units/ml Monitor platelets by anticoagulation protocol: Yes   Plan:  -Heparin 2000 units BOLUS -Increase heparin drip to 1300 units/hr -1300 HL -Daily CBC/HL -Monitor for bleeding -F/U VVS plans  Narda Bonds 08/10/2013,5:06 AM

## 2013-08-11 ENCOUNTER — Encounter (HOSPITAL_COMMUNITY): Payer: Self-pay | Admitting: Cardiology

## 2013-08-11 ENCOUNTER — Inpatient Hospital Stay (HOSPITAL_COMMUNITY): Payer: BC Managed Care – PPO | Admitting: Certified Registered"

## 2013-08-11 ENCOUNTER — Encounter (HOSPITAL_COMMUNITY)
Admission: RE | Disposition: A | Discharge status: Home / Self Care | Payer: Self-pay | Source: Ambulatory Visit | Attending: Cardiovascular Disease

## 2013-08-11 ENCOUNTER — Encounter (HOSPITAL_COMMUNITY): Payer: BC Managed Care – PPO | Admitting: Certified Registered"

## 2013-08-11 ENCOUNTER — Inpatient Hospital Stay (HOSPITAL_COMMUNITY): Payer: BC Managed Care – PPO

## 2013-08-11 DIAGNOSIS — I70219 Atherosclerosis of native arteries of extremities with intermittent claudication, unspecified extremity: Secondary | ICD-10-CM

## 2013-08-11 HISTORY — PX: FEMORAL-POPLITEAL BYPASS GRAFT: SHX937

## 2013-08-11 LAB — CBC
HCT: 33.7 % — ABNORMAL LOW (ref 39.0–52.0)
HEMATOCRIT: 33 % — AB (ref 39.0–52.0)
HEMOGLOBIN: 10.8 g/dL — AB (ref 13.0–17.0)
Hemoglobin: 10.6 g/dL — ABNORMAL LOW (ref 13.0–17.0)
MCH: 26.6 pg (ref 26.0–34.0)
MCH: 27.6 pg (ref 26.0–34.0)
MCHC: 32.1 g/dL (ref 30.0–36.0)
MCHC: 32 g/dL (ref 30.0–36.0)
MCV: 82.9 fL (ref 78.0–100.0)
MCV: 86 fL (ref 78.0–100.0)
PLATELETS: 289 10*3/uL (ref 150–400)
Platelets: 253 10*3/uL (ref 150–400)
RBC: 3.98 MIL/uL — ABNORMAL LOW (ref 4.22–5.81)
RBC: 3.92 MIL/uL — ABNORMAL LOW (ref 4.22–5.81)
RDW: 32.3 % — AB (ref 11.5–15.5)
RDW: 32.2 % — ABNORMAL HIGH (ref 11.5–15.5)
WBC: 8.2 10*3/uL (ref 4.0–10.5)
WBC: 12.4 10*3/uL — ABNORMAL HIGH (ref 4.0–10.5)

## 2013-08-11 LAB — HEPARIN LEVEL (UNFRACTIONATED): Heparin Unfractionated: 0.27 IU/mL — ABNORMAL LOW (ref 0.30–0.70)

## 2013-08-11 LAB — BASIC METABOLIC PANEL
ANION GAP: 12 (ref 5–15)
BUN: 16 mg/dL (ref 6–23)
CALCIUM: 8.2 mg/dL — AB (ref 8.4–10.5)
CHLORIDE: 105 meq/L (ref 96–112)
CO2: 21 meq/L (ref 19–32)
Creatinine, Ser: 0.83 mg/dL (ref 0.50–1.35)
GFR calc Af Amer: >90 mL/min (ref >90)
GFR calc non Af Amer: 88 mL/min — ABNORMAL LOW (ref >90)
GLUCOSE: 104 mg/dL — AB (ref 70–99)
Potassium: 4.2 mEq/L (ref 3.7–5.3)
Sodium: 138 mEq/L (ref 137–147)

## 2013-08-11 LAB — PROTIME-INR
INR: 1.17 (ref 0.00–1.49)
Prothrombin Time: 14.9 seconds (ref 11.6–15.2)

## 2013-08-11 LAB — MRSA PCR SCREENING: MRSA by PCR: NEGATIVE

## 2013-08-11 LAB — APTT: aPTT: 31 seconds (ref 24–37)

## 2013-08-11 SURGERY — BYPASS GRAFT FEMORAL-POPLITEAL ARTERY
Anesthesia: General | Site: Leg Lower | Laterality: Right

## 2013-08-11 MED ORDER — LABETALOL HCL 5 MG/ML IV SOLN
10.0000 mg | INTRAVENOUS | Status: DC | PRN
  Filled 2013-08-11: qty 4

## 2013-08-11 MED ORDER — HYDROMORPHONE HCL PF 1 MG/ML IJ SOLN
INTRAMUSCULAR | Status: AC
  Administered 2013-08-11: 0.5 mg via INTRAVENOUS
  Filled 2013-08-11: qty 1

## 2013-08-11 MED ORDER — POTASSIUM CHLORIDE CRYS ER 20 MEQ PO TBCR: 20.0000 meq | EXTENDED_RELEASE_TABLET | Freq: Every day | ORAL | Status: DC | PRN

## 2013-08-11 MED ORDER — LACTATED RINGERS IV SOLN: INTRAVENOUS | Status: DC

## 2013-08-11 MED ORDER — PROPOFOL 10 MG/ML IV BOLUS
INTRAVENOUS | Status: DC | PRN
  Administered 2013-08-11: 150 mg via INTRAVENOUS

## 2013-08-11 MED ORDER — ONDANSETRON HCL 4 MG/2ML IJ SOLN
INTRAMUSCULAR | Status: AC
  Filled 2013-08-11: qty 2

## 2013-08-11 MED ORDER — LACTATED RINGERS IV SOLN
INTRAVENOUS | Status: DC | PRN
  Administered 2013-08-11 (×2): via INTRAVENOUS

## 2013-08-11 MED ORDER — FENTANYL CITRATE 0.05 MG/ML IJ SOLN
INTRAMUSCULAR | Status: DC | PRN
Start: 2013-08-11 — End: 2013-08-11
  Administered 2013-08-11 (×2): 25 ug via INTRAVENOUS
  Administered 2013-08-11: 100 ug via INTRAVENOUS
  Administered 2013-08-11 (×2): 50 ug via INTRAVENOUS

## 2013-08-11 MED ORDER — SODIUM CHLORIDE 0.9 % IV SOLN: 500.0000 mL | Freq: Once | INTRAVENOUS | Status: AC | PRN

## 2013-08-11 MED ORDER — 0.9 % SODIUM CHLORIDE (POUR BTL) OPTIME
TOPICAL | Status: DC | PRN
  Administered 2013-08-11: 2000 mL

## 2013-08-11 MED ORDER — METOPROLOL TARTRATE 1 MG/ML IV SOLN: 2.0000 mg | INTRAVENOUS | Status: DC | PRN

## 2013-08-11 MED ORDER — HYDRALAZINE HCL 20 MG/ML IJ SOLN
10.0000 mg | INTRAMUSCULAR | Status: DC | PRN
  Filled 2013-08-11: qty 0.5

## 2013-08-11 MED ORDER — PHENOL 1.4 % MT LIQD: 1.0000 | OROMUCOSAL | Status: DC | PRN

## 2013-08-11 MED ORDER — PROMETHAZINE HCL 25 MG/ML IJ SOLN: 6.2500 mg | INTRAMUSCULAR | Status: DC | PRN

## 2013-08-11 MED ORDER — OXYCODONE HCL 5 MG PO TABS
5.0000 mg | ORAL_TABLET | Freq: Once | ORAL | Status: AC | PRN
  Administered 2013-08-11: 5 mg via ORAL

## 2013-08-11 MED ORDER — OXYCODONE HCL 5 MG PO TABS
5.0000 mg | ORAL_TABLET | Freq: Four times a day (QID) | ORAL | Status: DC | PRN
  Administered 2013-08-11 – 2013-08-12 (×2): 10 mg via ORAL
  Administered 2013-08-13: 5 mg via ORAL
  Administered 2013-08-13 (×2): 10 mg via ORAL
  Administered 2013-08-13: 5 mg via ORAL
  Administered 2013-08-14: 10 mg via ORAL
  Administered 2013-08-14: 5 mg via ORAL
  Administered 2013-08-14: 10 mg via ORAL
  Filled 2013-08-11 (×3): qty 2
  Filled 2013-08-11: qty 1
  Filled 2013-08-11 (×4): qty 2

## 2013-08-11 MED ORDER — NEOSTIGMINE METHYLSULFATE 10 MG/10ML IV SOLN
INTRAVENOUS | Status: AC
  Filled 2013-08-11: qty 1

## 2013-08-11 MED ORDER — MIDAZOLAM HCL 5 MG/5ML IJ SOLN
INTRAMUSCULAR | Status: DC | PRN
  Administered 2013-08-11: 2 mg via INTRAVENOUS

## 2013-08-11 MED ORDER — MORPHINE SULFATE 2 MG/ML IJ SOLN
2.0000 mg | INTRAMUSCULAR | Status: DC | PRN
  Administered 2013-08-11 – 2013-08-12 (×10): 4 mg via INTRAVENOUS
  Administered 2013-08-13: 2 mg via INTRAVENOUS
  Administered 2013-08-13: 4 mg via INTRAVENOUS
  Filled 2013-08-11 (×6): qty 2
  Filled 2013-08-11: qty 1
  Filled 2013-08-11 (×4): qty 2

## 2013-08-11 MED ORDER — NEOSTIGMINE METHYLSULFATE 10 MG/10ML IV SOLN
INTRAVENOUS | Status: DC | PRN
  Administered 2013-08-11: 2 mg via INTRAVENOUS

## 2013-08-11 MED ORDER — MIDAZOLAM HCL 2 MG/2ML IJ SOLN
INTRAMUSCULAR | Status: AC
  Filled 2013-08-11: qty 2

## 2013-08-11 MED ORDER — IOHEXOL 300 MG/ML  SOLN
INTRAMUSCULAR | Status: DC | PRN
  Administered 2013-08-11: 25 mL via INTRAVENOUS

## 2013-08-11 MED ORDER — HEPARIN SODIUM (PORCINE) 1000 UNIT/ML IJ SOLN
INTRAMUSCULAR | Status: DC | PRN
  Administered 2013-08-11: 2000 [IU] via INTRAVENOUS
  Administered 2013-08-11: 6000 [IU] via INTRAVENOUS

## 2013-08-11 MED ORDER — HYDROMORPHONE HCL PF 1 MG/ML IJ SOLN
0.2500 mg | INTRAMUSCULAR | Status: DC | PRN
  Administered 2013-08-11 (×4): 0.5 mg via INTRAVENOUS

## 2013-08-11 MED ORDER — PHENYLEPHRINE HCL 10 MG/ML IJ SOLN
INTRAMUSCULAR | Status: DC | PRN
  Administered 2013-08-11: 40 ug via INTRAVENOUS

## 2013-08-11 MED ORDER — ACETAMINOPHEN 325 MG PO TABS
325.0000 mg | ORAL_TABLET | ORAL | Status: DC | PRN
  Administered 2013-08-13: 650 mg via ORAL
  Filled 2013-08-11: qty 2

## 2013-08-11 MED ORDER — OXYCODONE HCL 5 MG PO TABS
ORAL_TABLET | ORAL | Status: AC
  Administered 2013-08-11: 10 mg via ORAL
  Filled 2013-08-11: qty 2

## 2013-08-11 MED ORDER — DOCUSATE SODIUM 100 MG PO CAPS
100.0000 mg | ORAL_CAPSULE | Freq: Every day | ORAL | Status: DC
  Administered 2013-08-13: 100 mg via ORAL
  Filled 2013-08-11 (×2): qty 1

## 2013-08-11 MED ORDER — OXYCODONE HCL 5 MG/5ML PO SOLN: 5.0000 mg | Freq: Once | ORAL | Status: AC | PRN

## 2013-08-11 MED ORDER — MORPHINE SULFATE 2 MG/ML IJ SOLN
INTRAMUSCULAR | Status: AC
  Filled 2013-08-11: qty 1

## 2013-08-11 MED ORDER — GLYCOPYRROLATE 0.2 MG/ML IJ SOLN
INTRAMUSCULAR | Status: AC
  Filled 2013-08-11: qty 2

## 2013-08-11 MED ORDER — HEPARIN (PORCINE) IN NACL 100-0.45 UNIT/ML-% IJ SOLN
1000.0000 [IU]/h | INTRAMUSCULAR | Status: DC
  Administered 2013-08-11: 600 [IU]/h via INTRAVENOUS
  Filled 2013-08-11 (×3): qty 250

## 2013-08-11 MED ORDER — PANTOPRAZOLE SODIUM 40 MG PO TBEC
40.0000 mg | DELAYED_RELEASE_TABLET | Freq: Every day | ORAL | Status: DC
  Administered 2013-08-11 – 2013-08-14 (×3): 40 mg via ORAL
  Filled 2013-08-11 (×3): qty 1

## 2013-08-11 MED ORDER — ALUM & MAG HYDROXIDE-SIMETH 200-200-20 MG/5ML PO SUSP: 15.0000 mL | ORAL | Status: DC | PRN

## 2013-08-11 MED ORDER — BISACODYL 10 MG RE SUPP: 10.0000 mg | Freq: Every day | RECTAL | Status: DC | PRN

## 2013-08-11 MED ORDER — SODIUM CHLORIDE 0.9 % IV SOLN
INTRAVENOUS | Status: DC
  Administered 2013-08-11: 20:00:00 via INTRAVENOUS

## 2013-08-11 MED ORDER — MORPHINE SULFATE 2 MG/ML IJ SOLN
INTRAMUSCULAR | Status: AC
  Administered 2013-08-11: 4 mg via INTRAVENOUS
  Filled 2013-08-11: qty 1

## 2013-08-11 MED ORDER — MAGNESIUM SULFATE 40 MG/ML IJ SOLN
2.0000 g | Freq: Every day | INTRAMUSCULAR | Status: DC | PRN
  Filled 2013-08-11: qty 50

## 2013-08-11 MED ORDER — SODIUM CHLORIDE 0.9 % IR SOLN
Status: DC | PRN
  Administered 2013-08-11: 11:00:00

## 2013-08-11 MED ORDER — GUAIFENESIN-DM 100-10 MG/5ML PO SYRP: 15.0000 mL | ORAL_SOLUTION | ORAL | Status: DC | PRN

## 2013-08-11 MED ORDER — PROTAMINE SULFATE 10 MG/ML IV SOLN
INTRAVENOUS | Status: AC
  Filled 2013-08-11: qty 5

## 2013-08-11 MED ORDER — CEFAZOLIN SODIUM-DEXTROSE 2-3 GM-% IV SOLR
INTRAVENOUS | Status: AC
  Filled 2013-08-11: qty 50

## 2013-08-11 MED ORDER — PROPOFOL 10 MG/ML IV BOLUS
INTRAVENOUS | Status: AC
  Filled 2013-08-11: qty 20

## 2013-08-11 MED ORDER — ACETAMINOPHEN 650 MG RE SUPP: 325.0000 mg | RECTAL | Status: DC | PRN

## 2013-08-11 MED ORDER — DEXTROSE 5 % IV SOLN
1.5000 g | Freq: Two times a day (BID) | INTRAVENOUS | Status: AC
  Administered 2013-08-11 – 2013-08-12 (×2): 1.5 g via INTRAVENOUS
  Filled 2013-08-11 (×2): qty 1.5

## 2013-08-11 MED ORDER — OXYCODONE HCL 5 MG PO TABS
ORAL_TABLET | ORAL | Status: AC
  Administered 2013-08-11: 5 mg via ORAL
  Filled 2013-08-11: qty 1

## 2013-08-11 MED ORDER — GLYCOPYRROLATE 0.2 MG/ML IJ SOLN
INTRAMUSCULAR | Status: DC | PRN
  Administered 2013-08-11: 0.2 mg via INTRAVENOUS

## 2013-08-11 MED ORDER — DOPAMINE-DEXTROSE 3.2-5 MG/ML-% IV SOLN: 3.0000 ug/kg/min | INTRAVENOUS | Status: DC

## 2013-08-11 MED ORDER — HEPARIN SODIUM (PORCINE) 1000 UNIT/ML IJ SOLN
INTRAMUSCULAR | Status: AC
  Filled 2013-08-11: qty 1

## 2013-08-11 MED ORDER — ROCURONIUM BROMIDE 100 MG/10ML IV SOLN
INTRAVENOUS | Status: DC | PRN
  Administered 2013-08-11: 50 mg via INTRAVENOUS

## 2013-08-11 MED ORDER — HYDROCODONE-ACETAMINOPHEN 5-325 MG PO TABS
ORAL_TABLET | ORAL | Status: AC
  Administered 2013-08-11: 1 via ORAL
  Filled 2013-08-11: qty 1

## 2013-08-11 MED ORDER — LIDOCAINE HCL (CARDIAC) 20 MG/ML IV SOLN
INTRAVENOUS | Status: DC | PRN
  Administered 2013-08-11: 80 mg via INTRAVENOUS

## 2013-08-11 MED ORDER — FENTANYL CITRATE 0.05 MG/ML IJ SOLN
INTRAMUSCULAR | Status: AC
  Filled 2013-08-11: qty 5

## 2013-08-11 MED ORDER — ONDANSETRON HCL 4 MG/2ML IJ SOLN
INTRAMUSCULAR | Status: DC | PRN
  Administered 2013-08-11: 4 mg via INTRAVENOUS

## 2013-08-11 SURGICAL SUPPLY — 65 items
BANDAGE ESMARK 6X9 LF (GAUZE/BANDAGES/DRESSINGS) IMPLANT
BLADE 10 SAFETY STRL DISP (BLADE) ×3 IMPLANT
BNDG ESMARK 6X9 LF (GAUZE/BANDAGES/DRESSINGS)
BOOT SUTURE AID YELLOW STND (SUTURE) IMPLANT
CANISTER SUCTION 2500CC (MISCELLANEOUS) ×3 IMPLANT
CATH EMB 3FR 40CM (CATHETERS) ×3 IMPLANT
CATH EMB 3FR 80CM (CATHETERS) ×3 IMPLANT
CATH EMB 4FR 80CM (CATHETERS) ×3 IMPLANT
CLIP TI MEDIUM 24 (CLIP) ×3 IMPLANT
CLIP TI WIDE RED SMALL 24 (CLIP) ×9 IMPLANT
COVER SURGICAL LIGHT HANDLE (MISCELLANEOUS) ×3 IMPLANT
DECANTER SPIKE VIAL GLASS SM (MISCELLANEOUS) IMPLANT
DERMABOND ADHESIVE PROPEN (GAUZE/BANDAGES/DRESSINGS) ×2
DERMABOND ADVANCED (GAUZE/BANDAGES/DRESSINGS) ×2
DERMABOND ADVANCED .7 DNX12 (GAUZE/BANDAGES/DRESSINGS) ×1 IMPLANT
DERMABOND ADVANCED .7 DNX6 (GAUZE/BANDAGES/DRESSINGS) ×1 IMPLANT
DRAIN SNY 10X20 3/4 PERF (WOUND CARE) IMPLANT
DRAPE WARM FLUID 44X44 (DRAPE) ×3 IMPLANT
DRAPE X-RAY CASS 24X20 (DRAPES) ×3 IMPLANT
ELECT REM PT RETURN 9FT ADLT (ELECTROSURGICAL) ×3
ELECTRODE REM PT RTRN 9FT ADLT (ELECTROSURGICAL) ×1 IMPLANT
EVACUATOR SILICONE 100CC (DRAIN) IMPLANT
GLOVE BIO SURGEON STRL SZ 6.5 (GLOVE) ×2 IMPLANT
GLOVE BIO SURGEON STRL SZ7.5 (GLOVE) ×6 IMPLANT
GLOVE BIO SURGEONS STRL SZ 6.5 (GLOVE) ×1
GLOVE BIOGEL PI IND STRL 6.5 (GLOVE) ×2 IMPLANT
GLOVE BIOGEL PI IND STRL 8 (GLOVE) ×1 IMPLANT
GLOVE BIOGEL PI INDICATOR 6.5 (GLOVE) ×4
GLOVE BIOGEL PI INDICATOR 8 (GLOVE) ×2
GLOVE SS BIOGEL STRL SZ 7 (GLOVE) ×1 IMPLANT
GLOVE SUPERSENSE BIOGEL SZ 7 (GLOVE) ×2
GLOVE SURG SS PI 6.5 STRL IVOR (GLOVE) ×9 IMPLANT
GOWN STRL REUS W/ TWL LRG LVL3 (GOWN DISPOSABLE) ×3 IMPLANT
GOWN STRL REUS W/TWL LRG LVL3 (GOWN DISPOSABLE) ×6
INSERT FOGARTY SM (MISCELLANEOUS) ×3 IMPLANT
KIT BASIN OR (CUSTOM PROCEDURE TRAY) ×3 IMPLANT
KIT ROOM TURNOVER OR (KITS) ×3 IMPLANT
LOOP VESSEL MINI RED (MISCELLANEOUS) ×3 IMPLANT
NS IRRIG 1000ML POUR BTL (IV SOLUTION) ×6 IMPLANT
PACK PERIPHERAL VASCULAR (CUSTOM PROCEDURE TRAY) ×3 IMPLANT
PAD ARMBOARD 7.5X6 YLW CONV (MISCELLANEOUS) ×6 IMPLANT
PADDING CAST COTTON 6X4 STRL (CAST SUPPLIES) IMPLANT
PROBE PENCIL 8 MHZ STRL DISP (MISCELLANEOUS) ×3 IMPLANT
SET COLLECT BLD 21X3/4 12 (NEEDLE) ×3 IMPLANT
STOPCOCK 4 WAY LG BORE MALE ST (IV SETS) ×3 IMPLANT
SUT PROLENE 5 0 C 1 36 (SUTURE) ×3 IMPLANT
SUT PROLENE 6 0 BV (SUTURE) IMPLANT
SUT PROLENE 6 0 C 1 24 (SUTURE) ×3 IMPLANT
SUT PROLENE 6 0 CC (SUTURE) ×12 IMPLANT
SUT PROLENE 7 0 BV 1 (SUTURE) IMPLANT
SUT PROLENE 7 0 BV1 MDA (SUTURE) IMPLANT
SUT SILK 2 0 SH (SUTURE) ×3 IMPLANT
SUT SILK 3 0 (SUTURE)
SUT SILK 3-0 18XBRD TIE 12 (SUTURE) IMPLANT
SUT VIC AB 2-0 CTX 36 (SUTURE) ×6 IMPLANT
SUT VIC AB 3-0 SH 27 (SUTURE) ×6
SUT VIC AB 3-0 SH 27X BRD (SUTURE) ×3 IMPLANT
SUT VIC AB 4-0 PS2 27 (SUTURE) ×6 IMPLANT
SYR 3ML LL SCALE MARK (SYRINGE) ×3 IMPLANT
TOWEL OR 17X24 6PK STRL BLUE (TOWEL DISPOSABLE) ×6 IMPLANT
TOWEL OR 17X26 10 PK STRL BLUE (TOWEL DISPOSABLE) ×6 IMPLANT
TRAY FOLEY CATH 16FRSI W/METER (SET/KITS/TRAYS/PACK) ×3 IMPLANT
TUBING EXTENTION W/L.L. (IV SETS) ×3 IMPLANT
UNDERPAD 30X30 INCONTINENT (UNDERPADS AND DIAPERS) ×3 IMPLANT
WATER STERILE IRR 1000ML POUR (IV SOLUTION) ×3 IMPLANT

## 2013-08-11 NOTE — Anesthesia Preprocedure Evaluation (Addendum)
Anesthesia Evaluation  Patient identified by MRN, date of birth, ID band Patient awake    Reviewed: Allergy & Precautions, H&P , NPO status , Patient's Chart, lab work & pertinent test results  History of Anesthesia Complications Negative for: history of anesthetic complications  Airway Mallampati: I  Neck ROM: Full    Dental  (+) Edentulous Upper   Pulmonary neg pulmonary ROS,  breath sounds clear to auscultation        Cardiovascular hypertension, + Peripheral Vascular Disease Rhythm:Regular Rate:Normal     Neuro/Psych negative neurological ROS     GI/Hepatic Neg liver ROS, PUD,   Endo/Other  negative endocrine ROS  Renal/GU      Musculoskeletal negative musculoskeletal ROS (+)   Abdominal   Peds  Hematology negative hematology ROS (+)   Anesthesia Other Findings   Reproductive/Obstetrics                          Anesthesia Physical Anesthesia Plan  ASA: III  Anesthesia Plan: General   Post-op Pain Management:    Induction: Intravenous  Airway Management Planned: Oral ETT  Additional Equipment:   Intra-op Plan:   Post-operative Plan: Extubation in OR  Informed Consent: I have reviewed the patients History and Physical, chart, labs and discussed the procedure including the risks, benefits and alternatives for the proposed anesthesia with the patient or authorized representative who has indicated his/her understanding and acceptance.   Dental advisory given  Plan Discussed with: CRNA and Surgeon  Anesthesia Plan Comments:         Anesthesia Quick Evaluation

## 2013-08-11 NOTE — Progress Notes (Signed)
Have not received heparin; pharmacy notified

## 2013-08-11 NOTE — Op Note (Signed)
OPERATIVE REPORT  Date of Surgery: 08/09/2013 - 08/11/2013  Surgeon: Tinnie Gens, MD  Assistant: Dr. Earney Mallet  Pre-op Diagnosis  bilateral chronic ischemia-10 weeks in duration-secondary to diffuse emboli to lower extremities into tibial vessels  Post-op Diagnosis: Same  Procedure: Procedure(s):   RIGHT - POPLITEAL TO PERONEAL ARTERY BYPASS GRAFT  WITH NONREVERSED SAPHENOUS VEIN GRAFT,tHROMBECTOMY ANTERIOR TIBIALIS,ATTEMPTED THROMBECTOMY TIBIO-PERONEAL TRUNK AND POSTERIOR TIBIALIS, INTRAOPERATIVE ARTERIOGRAM.  Anesthesia: General  EBL: 150 cc  The patient was taken to the operating room placed in the supine position at which time satisfactory general endotracheal anesthesia was administered. The right lower extremity was prepped with Betadine scrub and solution draped routine sterile manner. A medial incision was made below the knee carried down through subcutaneous tissue avoiding injury to the saphenous vein which was quite small in this area. Popliteal artery was exposed in the below-knee position. Complete dissection of the tibioperoneal trunk anterior and posterior tibial arteries and the wound was performed. Incision extended down the upper third of the lower leg. Anterior tibial tibioperoneal trunk and peroneal were all thrombosed and were diffusely diseased as well. The peroneal artery which was the only vessel patent on angiogram was dissected free at about the midportion of the lower leg where it became a softer vessel. The other vessels were all diffusely diseased and thrombosed. Great saphenous vein was then removed from the saphenofemoral junction to the mid thigh through 2 incisions branches ligated with 3 and 4-0 silk ties and divided it was gently dilated with heparinized saline marked for orientation purposes was satisfactory. Patient was then heparinized. Longed eliminating the distal end of the popliteal artery there was an excellent pulse this level. 4 Fogarty catheter was  passed proximally and no thrombus retrieved from the proximal popliteal or superficial femoral artery and there was excellent inflow. 3 and 4 Fogarty catheters were then passed down the anterior tibial artery and they would traverse this down to the ankle level with a rigid obstruction. Multiple passes yielded no further clot and there was very sluggish backbleeding. Temp was made to pass the Fogarty down the tibioperoneal trunk and both posterior tibial and peroneal arteries this was unsuccessful because of diffuse disease and the posterior tibial artery was never able to be thrombectomized and all the peroneal was only thrombectomized slightly but much more proximal than where the vessel was patent on angiogram. Was decided to proceed with a popliteal to peroneal bypass. The vein was used to nonreversible ashen. Vein was carefully spatulated and anastomosed to the popliteal artery with 6-0 Prolene. Clamps are laced there is good pulse down to the first set of competent valves. Using the retrograde valvulotome the valves were rendered incompetent with resultant excellent flow of the distal and the vein graft. Peroneal artery was then opened with 15 blade extended with Potts scissors it would accept a 2-1/2 dilator distally it was diseased but not severely so in this area. Fogarty would not traverse proximally because of thrombus. Findings carefully measured special aid and this was inset with 6-0 Prolene. This ellipse was then released and there was a good pulse and excellent Doppler flow in the peroneal artery in the field. Intraoperative arteriogram was performed which revealed one vessel runoff through the peroneal artery. Anterior tibial artery only was visualized down about 4 or 5 cm because of very sluggish flow. The tibioperoneal trunk had been ligated with 2-0 silk tie to prevent any further embolization in the peroneal artery was ligated proximal to this distal anastomosis. Following  this adequate  hemostasis was achieved the wounds were closed in layers with Vicryl in a subcuticular fashion with Dermabond patient taken to recovery room in stable condition  Complications: None  Procedure Details:   Tinnie Gens, MD 08/11/2013 1:26 PM

## 2013-08-11 NOTE — Progress Notes (Addendum)
Pt received from PACU, R pedal pulse not doppler ed,MD/N and made aware, nursing will cont to monitor

## 2013-08-11 NOTE — Interval H&P Note (Signed)
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 After consideration of risks, benefits and other options for treatment, the patient has consented to  Procedure(s): BYPASS GRAFT  RIGHT - POPLITEAL TO PERONEAL ARTERY WITH SAPHENOUS VEIN GRAFT (Right) as a surgical intervention .  The patient's history has been reviewed, patient examined, no change in status, stable for surgery.  I have reviewed the patient's chart and labs.  Questions were answered to the patient's satisfaction.     Tinnie Gens

## 2013-08-11 NOTE — H&P (View-Only) (Signed)
Patient ID: Cody Suarez, male   DOB: Jun 04, 1945, 68 y.o.   MRN: [id number] Vascular Surgery Progress Note  Subjective: Multiple emboli to both lower extremities-10 weeks ago-needs revascularization  Objective:  Filed Vitals:   08/10/13 1003  BP:   Pulse: 72  Temp:   Resp: 18    General well-developed well-nourished male in no apparent stress Alert and oriented x3 Both lower extremities with adequate perfusion.  Today echocardiogram revealed mobile thrombus in descending thoracic aorta CT angiogram of the thoracic aorta and abdominal aorta pending   Labs:  Recent Labs Lab 08/10/13 0422  CREATININE 0.89    Recent Labs Lab 08/10/13 0422  NA 142  K 4.4  CL 107  CO2 24  BUN 13  CREATININE 0.89  GLUCOSE 99  CALCIUM 8.3*    Recent Labs Lab 08/10/13 0422  WBC 9.1  HGB 11.0*  HCT 35.3*  PLT 278    Recent Labs Lab 08/09/13 1548 08/10/13 0422  INR 1.29 1.10    I/O last 3 completed shifts: In: 362.6 [P.O.:240; I.V.:122.6] Out: 2325 [Urine:2325]  Imaging: No results found.  Assessment/Pla  LOS: 1 day  s/p Procedure(s): TRANSESOPHAGEAL ECHOCARDIOGRAM (TEE)  The thrombus in the descending thoracic aorta is likely source of a lot of both lower extremities. Decision will need to be made regarding chronic anticoagulation versus covering this with drastic stent graft which would have some risk of dislodging thrombus Dr. Trula Slade Will review CT angiogram later today Will continue with same plan for revascularization right leg tomorrow with thrombectomy plus or minus popliteal to peroneal bypass Discuss this with patient and he is in agreement and understands about the potential source of the emboli to both legs Will preop patient for tomorrow with surgery scheduled approximately 10 AM   Tinnie Gens, MD 08/10/2013 11:36 AM

## 2013-08-11 NOTE — Progress Notes (Signed)
ANTICOAGULATION CONSULT NOTE - Follow Up Consult  Pharmacy Consult for heparin Indication: Multiple emboli to bilateral lower extremities.  No Known Allergies  Patient Measurements: Height: 5' 10"  (177.8 cm) Weight: 162 lb 4.1 oz (73.6 kg) IBW/kg (Calculated) : 73 Heparin Dosing Weight:   Vital Signs: Temp: 98.1 F (36.7 C) (08/07 0734) Temp src: Oral (08/07 0734) BP: 158/61 mmHg (08/07 0734) Pulse Rate: 67 (08/07 0734)  Labs:  Recent Labs  08/09/13 1548  08/10/13 0422 08/10/13 1248 08/10/13 2115 08/11/13 0400  HGB  --   --  11.0*  --   --  10.6*  HCT  --   --  35.3*  --   --  33.0*  PLT  --   --  278  --   --  289  LABPROT 16.1*  --  14.2  --   --  14.9  INR 1.29  --  1.10  --   --  1.17  HEPARINUNFRC  --   < > 0.15* 0.70 0.26* 0.27*  CREATININE  --   --  0.89  --   --  0.83  < > = values in this interval not displayed.  Estimated Creatinine Clearance: 88 ml/min (by C-G formula based on Cr of 0.83).   Medications:  Scheduled:  . amLODipine  5 mg Oral Daily  . Budesonide  9 mg Oral Daily  . capecitabine  1,500 mg Oral QAC supper  . capecitabine  2,000 mg Oral Q breakfast  .  ceFAZolin (ANCEF) IV  1 g Intravenous On Call  . potassium chloride SA  20 mEq Oral Daily  . sodium chloride  3 mL Intravenous Q12H   Infusions:  . sodium chloride 20 mL/hr at 08/11/13 0700  . heparin 1,200 Units/hr (08/11/13 0700)    Assessment: 68 yo male with multiple emboli to bilateral lower extremities is currently on subtherapeutic heparin.  Heparin level is 0.27 (rate was not adjusted overnight).  Plan surgery today  Goal of Therapy:  Heparin level 0.3-0.7 units/ml Monitor platelets by anticoagulation protocol: Yes   Plan:  1) Increase heparin drip to 1300 units/hr. 6hr heparin level. 2) f/u after surgery today  Lavelle Akel, Tsz-Yin 08/11/2013,8:37 AM

## 2013-08-11 NOTE — Anesthesia Procedure Notes (Signed)
Procedure Name: Intubation Date/Time: 08/11/2013 10:00 AM Performed by: Julian Reil Pre-anesthesia Checklist: Patient identified, Emergency Drugs available, Suction available and Patient being monitored Patient Re-evaluated:Patient Re-evaluated prior to inductionOxygen Delivery Method: Circle system utilized Preoxygenation: Pre-oxygenation with 100% oxygen Intubation Type: IV induction Ventilation: Mask ventilation without difficulty Laryngoscope Size: Mac and 4 Grade View: Grade I Tube type: Oral Tube size: 7.5 mm Number of attempts: 1 Airway Equipment and Method: Stylet Placement Confirmation: ETT inserted through vocal cords under direct vision,  positive ETCO2 and breath sounds checked- equal and bilateral Secured at: 22 cm Tube secured with: Tape Dental Injury: Teeth and Oropharynx as per pre-operative assessment

## 2013-08-11 NOTE — Anesthesia Postprocedure Evaluation (Signed)
  Anesthesia Post-op Note  Patient: Cody Suarez  Procedure(s) Performed: Procedure(s):   RIGHT - POPLITEAL TO PERONEAL ARTERY BYPASS GRAFT  WITH NONREVERSED SAPHENOUS VEIN GRAFT,tHROMBECTOMY ANTERIOR TIBIALIS,ATTEMPTED THROMBECTOMY TIBIO-PERONEAL TRUNK AND POSTERIOR TIBIALIS, INTRAOPERATIVE ARTERIOGRAM. (Right)  Patient Location: PACU  Anesthesia Type:General  Level of Consciousness: awake and alert   Airway and Oxygen Therapy: Patient Spontanous Breathing  Post-op Pain: mild  Post-op Assessment: Post-op Vital signs reviewed  Post-op Vital Signs: stable  Last Vitals:  Filed Vitals:   08/11/13 1500  BP: 155/65  Pulse: 58  Temp:   Resp: 16    Complications: No apparent anesthesia complications

## 2013-08-11 NOTE — Progress Notes (Signed)
Pharmacy notified to send heparin

## 2013-08-11 NOTE — Transfer of Care (Signed)
Immediate Anesthesia Transfer of Care Note  Patient: Cody Suarez  Procedure(s) Performed: Procedure(s):   RIGHT - POPLITEAL TO PERONEAL ARTERY BYPASS GRAFT  WITH NONREVERSED SAPHENOUS VEIN GRAFT,tHROMBECTOMY ANTERIOR TIBIALIS,ATTEMPTED THROMBECTOMY TIBIO-PERONEAL TRUNK AND POSTERIOR TIBIALIS, INTRAOPERATIVE ARTERIOGRAM. (Right)  Patient Location: PACU  Anesthesia Type:General  Level of Consciousness: awake, alert , oriented and patient cooperative  Airway & Oxygen Therapy: Patient Spontanous Breathing and Patient connected to nasal cannula oxygen  Post-op Assessment: Report given to PACU RN, Post -op Vital signs reviewed and stable and Patient moving all extremities  Post vital signs: Reviewed and stable  Complications: No apparent anesthesia complications

## 2013-08-11 NOTE — Progress Notes (Signed)
Still waiting on heparin; pharmacy notified again

## 2013-08-11 NOTE — Progress Notes (Signed)
Patient ID: Cody Suarez, male   DOB: [birth year], 68 y.o.   MRN: [id number] Patient is awake and alert in PACU Starting heparin drip at 600 units per hour Right leg has palpable dorsalis pedis pulse now at 2+ which was not present in the OR and good Doppler flow in the peroneal artery  Patient will need chronic anticoagulations which can be started in 48 hours-we'll continue heparin drip for the next 48 hours

## 2013-08-11 NOTE — Progress Notes (Signed)
  [facility] Day of Surgery Note  Subjective:  Patient seen in PACU. Doing well.   Filed Vitals:   08/11/13 1415  BP: 165/66  Pulse: 57  Temp:   Resp: 13   Resting in bed in NAD Palpable right DP pulse.    Assessment/Plan:  This is a 68 y.o. male who is s/p right popliteal to peroneal artery bypass graft  Day of surgery  Palpable DP pulse Ambulate On heparin drip 600 units/hr Transfer to 3S or 2S depending on bed availability.    Virgina Jock, [place] Pager: [phone] 08/11/2013 3:05 PM

## 2013-08-12 DIAGNOSIS — Z48812 Encounter for surgical aftercare following surgery on the circulatory system: Secondary | ICD-10-CM

## 2013-08-12 LAB — CBC
HEMATOCRIT: 31.6 % — AB (ref 39.0–52.0)
Hemoglobin: 10 g/dL — ABNORMAL LOW (ref 13.0–17.0)
MCH: 26.8 pg (ref 26.0–34.0)
MCHC: 31.6 g/dL (ref 30.0–36.0)
MCV: 84.7 fL (ref 78.0–100.0)
Platelets: 261 10*3/uL (ref 150–400)
RBC: 3.73 MIL/uL — ABNORMAL LOW (ref 4.22–5.81)
RDW: 32.1 % — AB (ref 11.5–15.5)
WBC: 9.5 10*3/uL (ref 4.0–10.5)

## 2013-08-12 LAB — BASIC METABOLIC PANEL
Anion gap: 13 (ref 5–15)
BUN: 13 mg/dL (ref 6–23)
CHLORIDE: 105 meq/L (ref 96–112)
CO2: 21 mEq/L (ref 19–32)
Calcium: 7.8 mg/dL — ABNORMAL LOW (ref 8.4–10.5)
Creatinine, Ser: 0.78 mg/dL (ref 0.50–1.35)
GFR calc non Af Amer: >90 mL/min (ref >90)
GLUCOSE: 110 mg/dL — AB (ref 70–99)
Potassium: 4.1 mEq/L (ref 3.7–5.3)
Sodium: 139 mEq/L (ref 137–147)

## 2013-08-12 LAB — HEPARIN LEVEL (UNFRACTIONATED)
Heparin Unfractionated: <0.1 IU/mL — ABNORMAL LOW (ref 0.30–0.70)
Heparin Unfractionated: 0.37 IU/mL (ref 0.30–0.70)

## 2013-08-12 LAB — APTT: aPTT: 22 seconds — ABNORMAL LOW (ref 24–37)

## 2013-08-12 MED ORDER — HEPARIN (PORCINE) IN NACL 100-0.45 UNIT/ML-% IJ SOLN
1350.0000 [IU]/h | INTRAMUSCULAR | Status: DC
  Administered 2013-08-12 (×2): 1200 [IU]/h via INTRAVENOUS
  Administered 2013-08-13: 1350 [IU]/h via INTRAVENOUS
  Filled 2013-08-12 (×5): qty 250

## 2013-08-12 NOTE — Progress Notes (Signed)
ANTICOAGULATION CONSULT NOTE - Follow Up Consult  Pharmacy Consult for heparin Indication: Multiple emboli to bilateral lower extremities.  No Known Allergies  Patient Measurements: Height: 6' (182.9 cm) Weight: 163 lb 2.3 oz (74 kg) IBW/kg (Calculated) : 77.6 Heparin Dosing Weight:   Vital Signs: Temp: 97.8 F (36.6 C) (08/08 0738) Temp src: Oral (08/08 0738) BP: 125/63 mmHg (08/08 0738) Pulse Rate: 63 (08/08 0738)  Labs:  Recent Labs  08/09/13 1548  08/10/13 0422  08/10/13 2115 08/11/13 0400 08/11/13 1610 08/11/13 1945 08/12/13 0301  HGB  --   < > 11.0*  --   --  10.6*  --  10.8* 10.0*  HCT  --   < > 35.3*  --   --  33.0*  --  33.7* 31.6*  PLT  --   < > 278  --   --  289  --  253 261  APTT  --   --   --   --   --   --  31  --  22*  LABPROT 16.1*  --  14.2  --   --  14.9  --   --   --   INR 1.29  --  1.10  --   --  1.17  --   --   --   HEPARINUNFRC  --   --  0.15*  < > 0.26* 0.27*  --   --  <0.10*  CREATININE  --   --  0.89  --   --  0.83  --   --  0.78  < > = values in this interval not displayed.  Estimated Creatinine Clearance: 92.5 ml/min (by C-G formula based on Cr of 0.78).   Medications:  Scheduled:  . amLODipine  5 mg Oral Daily  . Budesonide  9 mg Oral Daily  . capecitabine  1,500 mg Oral QAC supper  . capecitabine  2,000 mg Oral Q breakfast  . docusate sodium  100 mg Oral Daily  . pantoprazole  40 mg Oral Daily  . potassium chloride SA  20 mEq Oral Daily  . sodium chloride  3 mL Intravenous Q12H    Assessment: 68 yr old male with multiple emboli to the lower extremities now s/p bypass graft/thrombectomy on 8/7 and heparin was restarted at 600 units/hr post OR per MD . Pharmacy has been consulted to adjust heparin.  Patient was previously on heparin with rates ranging 1100-1300 units/hr were heparin levels ranged 0.15-0.7. Patient noted for Xarelto when more stable.  Goal of Therapy:  Heparin level 0.3-0.7 units/ml Monitor platelets by  anticoagulation protocol: Yes   Plan:  -Will increase heparin to 1200 units/hr based on prior rates and heparin levels -Heparin level in 6 hrs and daily -Will follow plans for Xarelto  Hildred Laser, Pharm D 08/12/2013 10:02 AM

## 2013-08-12 NOTE — Progress Notes (Addendum)
Progress Note    08/12/2013 8:10 AM 1 Day Post-Op  Subjective:  C/o soreness of right leg-denies numbness or pain in right foot  Afebrile HR 60's-80's regular 456'Y-563'S systolic 93% RA  Filed Vitals:   08/12/13 0738  BP: 125/63  Pulse: 63  Temp: 97.8 F (36.6 C)  Resp: 13    Physical Exam: Lungs:  CTAB Incisions:  C/d/i Extremities:  + doppler signal behind the knee in the bypass; difficulty finding right peroneal with doppler.  Motor and sensation are in tact right foot.  CBC    Component Value Date/Time   WBC 9.5 08/12/2013 0301   WBC 10.3 07/25/2013 1418   RBC 3.73* 08/12/2013 0301   RBC 4.03* 07/25/2013 1418   HGB 10.0* 08/12/2013 0301   HGB 10.2* 07/25/2013 1418   HCT 31.6* 08/12/2013 0301   HCT 32.3* 07/25/2013 1418   PLT 261 08/12/2013 0301   PLT 356 07/25/2013 1418   MCV 84.7 08/12/2013 0301   MCV 80.2 07/25/2013 1418   MCH 26.8 08/12/2013 0301   MCH 25.2* 07/25/2013 1418   MCHC 31.6 08/12/2013 0301   MCHC 31.5* 07/25/2013 1418   RDW 32.1* 08/12/2013 0301   RDW 37.4* 07/25/2013 1418   LYMPHSABS 1.6 07/25/2013 1418   LYMPHSABS 1.1 03/27/2013 0900   MONOABS 0.7 07/25/2013 1418   MONOABS 0.9 03/27/2013 0900   EOSABS 0.2 07/25/2013 1418   EOSABS 0.2 03/27/2013 0900   BASOSABS 0.1 07/25/2013 1418   BASOSABS 0.2* 03/27/2013 0900    BMET    Component Value Date/Time   NA 139 08/12/2013 0301   NA 137 07/25/2013 1418   K 4.1 08/12/2013 0301   K 3.7 07/25/2013 1418   CL 105 08/12/2013 0301   CO2 21 08/12/2013 0301   CO2 22 07/25/2013 1418   GLUCOSE 110* 08/12/2013 0301   GLUCOSE 121 07/25/2013 1418   BUN 13 08/12/2013 0301   BUN 17.2 07/25/2013 1418   CREATININE 0.78 08/12/2013 0301   CREATININE 1.1 07/25/2013 1418   CALCIUM 7.8* 08/12/2013 0301   CALCIUM 8.8 07/25/2013 1418   GFRNONAA >90 08/12/2013 0301   GFRAA >90 08/12/2013 0301    INR    Component Value Date/Time   INR 1.17 08/11/2013 0400     Intake/Output Summary (Last 24 hours) at 08/12/13 0810 Last data filed at 08/12/13 0600  Gross per 24 hour  Intake   2332 ml  Output   1915 ml  Net    417 ml     Assessment:  68 y.o. male is s/p:  RIGHT - POPLITEAL TO PERONEAL ARTERY BYPASS GRAFT WITH NONREVERSED SAPHENOUS VEIN GRAFT,tHROMBECTOMY ANTERIOR TIBIALIS,ATTEMPTED THROMBECTOMY TIBIO-PERONEAL TRUNK AND POSTERIOR TIBIALIS, INTRAOPERATIVE ARTERIOGRAM.  1 Day Post-Op  Plan: -difficulty hearing doppler signal in the right peroneal artery.  ABI and arterial duplex order of bypass graft this am (Dr. Kellie Simmering spoke with vascular lab this am) -will make pt NPO until results are called to Dr. Kellie Simmering this am. -DVT prophylaxis:  Heparin gtt-will increase to 1000U/hour and have pharmacy follow heparin -BUN/Cr normal this am after intraoperative arteriogram   Leontine Locket, PA-C [facility] [phone] 08/12/2013 8:10 AM  Patient with no complaints of pain or numbness in right foot. Main complaints are surgical discomfort related to incisions. Postoperatively patient had palpable dorsalis pedis pulse in right foot with obstructive Doppler signal which was surprising since patient had thrombosis of distal anterior tibial artery. That did occlude during the night and there is no longer palpable  dorsalis pedis pulse or Doppler flow. Patient's only outflow is peroneal artery and bypass is in to peroneal artery. I hear brisk biphasic Doppler flow over the bypass graft posteriorly. Have some difficulty finding flow in the peroneal artery distally the bypass certainly is open because of excellent flow heard in the graft. Will have vascular lab technologist check for ABIs and attempted duplex vein graft Patient on IV heparin per pharmacy and when felt stable enough will change to Xeralto  Duplex scan reveals bypass to be widely patent Not sure why absent doppler flow in peroneal artery distally  Will watch pt on iv heparin--foot totally assymptomatic

## 2013-08-12 NOTE — Progress Notes (Addendum)
VASCULAR LAB PRELIMINARY  ARTERIAL  ABI completed:    RIGHT    LEFT    PRESSURE WAVEFORM  PRESSURE WAVEFORM  BRACHIAL Not obtained due to IV Tri BRACHIAL 141 Tri  DP   DP    AT Not audible  AT 74 Mono  PT Dampened monophasic Too faint to obtain pressure PT 79 Mono  PER Not audible  PER 88 Mono  GREAT TOE  NA GREAT TOE  NA    RIGHT LEFT  ABI  0.62     Landry Mellow, RDMS, RVT  08/12/2013, 9:25 AM

## 2013-08-12 NOTE — Progress Notes (Signed)
PT Cancellation Note  Patient Details Name: Cody Suarez MRN: [id number] DOB: [birth year]   Cancelled Treatment:    Reason Eval/Treat Not Completed: Medical issues which prohibited therapy.  Pt had dopplers just before lunch and had some problems with pulses.  MD did not want OOB and he was coming to check pt.  Will return in am per nursing request and evaluate as able.  Thanks.    INGOLD,Daylynn Stumpp 08/12/2013, 3:15 PM [facility] [phone] (pager)

## 2013-08-12 NOTE — Progress Notes (Signed)
SUBJECTIVE:  Incisional pain was severe this AM.  No chest pain or SOB.   PHYSICAL EXAM Filed Vitals:   08/11/13 2341 08/11/13 2358 08/12/13 0350 08/12/13 0738  BP: 140/56  132/60 125/63  Pulse: 86 85 72 63  Temp: 98 F (36.7 C)  98.2 F (36.8 C) 97.8 F (36.6 C)  TempSrc: Oral  Oral Oral  Resp: 15 16 16 13   Height:      Weight:      SpO2: 99% 97% 99% 94%   General:  No acute distress Lungs:  Clear Heart:  RRR Abdomen:  Positive bowel sounds, no rebound no guarding Extremities:  Right foot slightly cool without DP/PT.  Mild edema.    LABS: No results found for this basename: TROPONINI   Results for orders placed during the hospital encounter of 08/09/13 (from the past 24 hour(s))  APTT     Status: None   Collection Time    08/11/13  4:10 PM      Result Value Ref Range   aPTT 31  24 - 37 seconds  MRSA PCR SCREENING     Status: None   Collection Time    08/11/13  5:19 PM      Result Value Ref Range   MRSA by PCR NEGATIVE  NEGATIVE  CBC     Status: Abnormal   Collection Time    08/11/13  7:45 PM      Result Value Ref Range   WBC 12.4 (*) 4.0 - 10.5 K/uL   RBC 3.92 (*) 4.22 - 5.81 MIL/uL   Hemoglobin 10.8 (*) 13.0 - 17.0 g/dL   HCT 33.7 (*) 39.0 - 52.0 %   MCV 86.0  78.0 - 100.0 fL   MCH 27.6  26.0 - 34.0 pg   MCHC 32.0  30.0 - 36.0 g/dL   RDW 32.2 (*) 11.5 - 15.5 %   Platelets 253  150 - 400 K/uL  CBC     Status: Abnormal   Collection Time    08/12/13  3:01 AM      Result Value Ref Range   WBC 9.5  4.0 - 10.5 K/uL   RBC 3.73 (*) 4.22 - 5.81 MIL/uL   Hemoglobin 10.0 (*) 13.0 - 17.0 g/dL   HCT 31.6 (*) 39.0 - 52.0 %   MCV 84.7  78.0 - 100.0 fL   MCH 26.8  26.0 - 34.0 pg   MCHC 31.6  30.0 - 36.0 g/dL   RDW 32.1 (*) 11.5 - 15.5 %   Platelets 261  150 - 400 K/uL  HEPARIN LEVEL (UNFRACTIONATED)     Status: Abnormal   Collection Time    08/12/13  3:01 AM      Result Value Ref Range   Heparin Unfractionated <0.10 (*) 0.30 - 0.70 IU/mL  APTT     Status:  Abnormal   Collection Time    08/12/13  3:01 AM      Result Value Ref Range   aPTT 22 (*) 24 - 37 seconds  BASIC METABOLIC PANEL     Status: Abnormal   Collection Time    08/12/13  3:01 AM      Result Value Ref Range   Sodium 139  137 - 147 mEq/L   Potassium 4.1  3.7 - 5.3 mEq/L   Chloride 105  96 - 112 mEq/L   CO2 21  19 - 32 mEq/L   Glucose, Bld 110 (*) 70 - 99 mg/dL   BUN  13  6 - 23 mg/dL   Creatinine, Ser 0.78  0.50 - 1.35 mg/dL   Calcium 7.8 (*) 8.4 - 10.5 mg/dL   GFR calc non Af Amer >90  >90 mL/min   GFR calc Af Amer >90  >90 mL/min   Anion gap 13  5 - 15    Intake/Output Summary (Last 24 hours) at 08/12/13 1021 Last data filed at 08/12/13 0738  Gross per 24 hour  Intake   2342 ml  Output   1915 ml  Net    427 ml    DUPLEX:  Preliminary report: Adequate flow noted throughout the common femoral and femoral arteries. Bypass appears open, but no flow noted in the anterior tibial arteries, or peroneal arteries. Dampened monophasic flow noted in the posterior tibial artery by imaging as well as by hand held Doppler.   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  No intracardiac source.  Status post right pop to peroneal bypass, thrombectomy/attempted thrombectomy.   Heparin for another 24 hours and then start Xarelto.  Dr. Kellie Simmering has ordered ABIs and further Duplex study to try to understand distal flow.     Jeneen Rinks [facility] 08/12/2013 10:21 AM

## 2013-08-12 NOTE — Progress Notes (Addendum)
VASCULAR LAB PRELIMINARY  PRELIMINARY  PRELIMINARY  PRELIMINARY  Right lower extremity arterial duplex completed.    Preliminary report:  Adequate flow noted throughout the common femoral and femoral arteries.  Bypass appears open, but no flow noted in the anterior tibial arteries, or peroneal arteries.  Dampened monophasic flow noted in the posterior tibial artery by imaging as well as by hand held Doppler.  Cody Suarez, RVT 08/12/2013, 9:49 AM

## 2013-08-12 NOTE — Progress Notes (Signed)
ANTICOAGULATION CONSULT NOTE - Follow Up Consult  Pharmacy Consult for heparin Indication: Multiple emboli to bilateral lower extremities.  No Known Allergies  Patient Measurements: Height: 6' (182.9 cm) Weight: 165 lb 5.5 oz (75 kg) IBW/kg (Calculated) : 77.6 Heparin Dosing Weight:   Vital Signs: Temp: 98.6 F (37 C) (08/08 1547) Temp src: Oral (08/08 1547) BP: 142/60 mmHg (08/08 1547) Pulse Rate: 81 (08/08 1547)  Labs:  Recent Labs  08/10/13 0422  08/11/13 0400 08/11/13 1610 08/11/13 1945 08/12/13 0301 08/12/13 1740  HGB 11.0*  --  10.6*  --  10.8* 10.0*  --   HCT 35.3*  --  33.0*  --  33.7* 31.6*  --   PLT 278  --  289  --  253 261  --   APTT  --   --   --  31  --  22*  --   LABPROT 14.2  --  14.9  --   --   --   --   INR 1.10  --  1.17  --   --   --   --   HEPARINUNFRC 0.15*  < > 0.27*  --   --  <0.10* 0.37  CREATININE 0.89  --  0.83  --   --  0.78  --   < > = values in this interval not displayed.  Estimated Creatinine Clearance: 93.8 ml/min (by C-G formula based on Cr of 0.78).   Medications:  Scheduled:  . amLODipine  5 mg Oral Daily  . Budesonide  9 mg Oral Daily  . capecitabine  1,500 mg Oral QAC supper  . capecitabine  2,000 mg Oral Q breakfast  . docusate sodium  100 mg Oral Daily  . pantoprazole  40 mg Oral Daily  . potassium chloride SA  20 mEq Oral Daily  . sodium chloride  3 mL Intravenous Q12H    Assessment: 68 yr old male with multiple emboli to the lower extremities now s/p bypass graft/thrombectomy on 8/7 and heparin was restarted at 600 units/hr post OR per MD . Pharmacy has been consulted to adjust heparin.  Patient was previously on heparin with rates ranging 1100-1300 units/hr were heparin levels ranged 0.15-0.7. Patient noted for Xarelto when more stable.  PM heparin level therapeutic  Goal of Therapy:  Heparin level 0.3-0.7 units/ml Monitor platelets by anticoagulation protocol: Yes   Plan:  -Continue heparin at 1200 units /  hr -Will follow plans for Xarelto -Follow up AM labs  Thank you. Anette Guarneri, PharmD [phone]   08/12/2013 6:48 PM

## 2013-08-13 LAB — CBC
HEMATOCRIT: 31.5 % — AB (ref 39.0–52.0)
Hemoglobin: 10 g/dL — ABNORMAL LOW (ref 13.0–17.0)
MCH: 27.8 pg (ref 26.0–34.0)
MCHC: 31.7 g/dL (ref 30.0–36.0)
MCV: 87.5 fL (ref 78.0–100.0)
Platelets: 256 10*3/uL (ref 150–400)
RBC: 3.6 MIL/uL — ABNORMAL LOW (ref 4.22–5.81)
RDW: 32.1 % — AB (ref 11.5–15.5)
WBC: 8.4 10*3/uL (ref 4.0–10.5)

## 2013-08-13 LAB — HEPARIN LEVEL (UNFRACTIONATED)
Heparin Unfractionated: 0.22 IU/mL — ABNORMAL LOW (ref 0.30–0.70)
Heparin Unfractionated: 0.31 IU/mL (ref 0.30–0.70)

## 2013-08-13 LAB — APTT: aPTT: 57 seconds — ABNORMAL HIGH (ref 24–37)

## 2013-08-13 NOTE — Progress Notes (Signed)
Occupational Therapy Evaluation Patient Details Name: Cody Suarez MRN: [id number] DOB: 03/08/1945 Today's Date: 08/13/2013    History of Present Illness Cody Suarez is a 68 y.o. Male s/p Rt popliteal to peroneal bypass.    Clinical Impression   PTA pt lived at home and was independent with ADLs and functional mobility. Pt reports being active. Pt currently requires min (A) for transfers and LB ADLs. Pt would benefit from skilled OT for increased independence prior to return home. Educated pt on fall prevention and energy conservation, including use of 3N1 in the tub.     Follow Up Recommendations  No OT follow up;Supervision/Assistance - 24 hour    Equipment Recommendations  3 in 1 bedside comode       Precautions / Restrictions Precautions Precautions: Fall Restrictions Weight Bearing Restrictions: No      Mobility Bed Mobility Overal bed mobility: Needs Assistance Bed Mobility: Supine to Sit;Sit to Supine     Supine to sit: Min guard;HOB elevated Sit to supine: Min guard;HOB elevated   General bed mobility comments: VC's for sequencing. Use of bed rail.   Transfers Overall transfer level: Needs assistance Equipment used: Rolling walker (2 wheeled) Transfers: Sit to/from Stand Sit to Stand: Min assist         General transfer comment: VC's for hand placement and (A) to power up to standing.         ADL Overall ADL's : Needs assistance/impaired Eating/Feeding: Independent;Sitting   Grooming: Min guard;Standing   Upper Body Bathing: Set up;Sitting   Lower Body Bathing: Minimal assistance;Sit to/from stand   Upper Body Dressing : Set up;Sitting   Lower Body Dressing: Minimal assistance;Sit to/from stand   Toilet Transfer: Minimal assistance;Ambulation;BSC;RW             General ADL Comments: Pt mobility limited by pain. Educated pt on fall prevention and energy conservation. Pt is active and ready to return to PLOF. Currently min (A) overall  for LB ADLs and transfers.      Vision  Pt wears glasses at all times.   Pt reports no change from baseline. No apparent visual deficits.                  Perception Perception Perception Tested?: No   Praxis Praxis Praxis tested?: Within functional limits    Pertinent Vitals/Pain Pain Assessment: 0-10 Pain Score: 5  Pain Descriptors / Indicators: Discomfort     Hand Dominance Right   Extremity/Trunk Assessment Upper Extremity Assessment Upper Extremity Assessment: Overall WFL for tasks assessed   Lower Extremity Assessment Lower Extremity Assessment: Defer to PT evaluation   Cervical / Trunk Assessment Cervical / Trunk Assessment: Normal   Communication Communication Communication: HOH   Cognition Arousal/Alertness: Awake/alert Behavior During Therapy: WFL for tasks assessed/performed Overall Cognitive Status: Within Functional Limits for tasks assessed                                Home Living Family/patient expects to be discharged to:: Private residence Living Arrangements: Spouse/significant other Available Help at Discharge: Family Type of Home: House Home Access: Stairs to enter Technical brewer of Steps: 4 Entrance Stairs-Rails: Can reach both Home Layout: One level     Bathroom Shower/Tub: Tub/shower unit Shower/tub characteristics: Architectural technologist: Standard     Home Equipment: None          Prior Functioning/Environment Level of Independence: Independent  OT Diagnosis: Generalized weakness;Acute pain   OT Problem List: Decreased strength;Decreased range of motion;Decreased activity tolerance;Impaired balance (sitting and/or standing);Decreased safety awareness;Decreased knowledge of use of DME or AE;Pain   OT Treatment/Interventions: Self-care/ADL training;Therapeutic exercise;Energy conservation;DME and/or AE instruction;Therapeutic activities;Patient/family education;Balance training     OT Goals(Current goals can be found in the care plan section) Acute Rehab OT Goals Patient Stated Goal: to go home OT Goal Formulation: With patient Time For Goal Achievement: 08/20/13 Potential to Achieve Goals: Good ADL Goals Pt Will Perform Grooming: with modified independence;standing Pt Will Perform Lower Body Bathing: with modified independence;sit to/from stand Pt Will Perform Lower Body Dressing: with modified independence;sit to/from stand Pt Will Transfer to Toilet: with modified independence;ambulating;bedside commode Pt Will Perform Toileting - Clothing Manipulation and hygiene: with modified independence;sit to/from stand Pt Will Perform Tub/Shower Transfer: Tub transfer;with supervision;ambulating;3 in 1;rolling walker  OT Frequency: Min 2X/week              End of Session Equipment Utilized During Treatment: Gait belt;Rolling walker  Activity Tolerance: Patient tolerated treatment well Patient left: in bed;with call bell/phone within reach   Time: 1745-1811 OT Time Calculation (min): 26 min Charges:  OT General Charges $OT Visit: 1 Procedure OT Evaluation $Initial OT Evaluation Tier I: 1 Procedure OT Treatments $Self Care/Home Management : 8-22 mins  Juluis Rainier [phone] 08/13/2013, 6:19 PM

## 2013-08-13 NOTE — Progress Notes (Signed)
Patient ID: Cody Suarez, male   DOB: 08/08/1945, 68 y.o.   MRN: [id number] Vascular Surgery Progress Note  Subjective: 2 days post right popliteal to peroneal bypass. Patient continues to deny any pain or numbness in right foot. Nurse has noted improving flow in the peroneal artery distally. Patient remains on IV heparin  Objective:  Filed Vitals:   08/13/13 0805  BP: 162/68  Pulse: 104  Temp: 97.9 F (36.6 C)  Resp: 13    General alert and oriented x3 Right leg with 3+ popliteal graft pulse and excellent Doppler flow in the graft which is biphasic. Audible Doppler flow in right peroneal and posterior tibial artery. Duplex scan reveals bypass to be widely patent this morning.   Labs:  Recent Labs Lab 08/10/13 0422 08/11/13 0400 08/12/13 0301  CREATININE 0.89 0.83 0.78    Recent Labs Lab 08/10/13 0422 08/11/13 0400 08/12/13 0301  NA 142 138 139  K 4.4 4.2 4.1  CL 107 105 105  CO2 24 21 21   BUN 13 16 13   CREATININE 0.89 0.83 0.78  GLUCOSE 99 104* 110*  CALCIUM 8.3* 8.2* 7.8*    Recent Labs Lab 08/11/13 1945 08/12/13 0301 08/13/13 0313  WBC 12.4* 9.5 8.4  HGB 10.8* 10.0* 10.0*  HCT 33.7* 31.6* 31.5*  PLT 253 261 256    Recent Labs Lab 08/09/13 1548 08/10/13 0422 08/11/13 0400  INR 1.29 1.10 1.17    I/O last 3 completed shifts: In: 1864.4 [P.O.:480; I.V.:1334.4; IV Piggyback:50] Out: 2650 [Urine:2650]  Imaging: Dg Ang/ext/uni/or Right  08/11/2013   CLINICAL DATA:  Popliteal to peroneal bypass  EXAM: RIGHT ANG/EXT/UNI/ OR  TECHNIQUE: Intraoperative single view angiogram  CONTRAST:  See operative note  FLUOROSCOPY TIME:  See operative note  COMPARISON:  None available  FINDINGS: Limited portable AP view of the right lower extremity performed over the calf at the distal anastomosis. The popliteal to peroneal bypass appears patent. Proximal and distal anastomoses are patent. Single peroneal runoff. Postop changes of soft tissues.  IMPRESSION: Patent  popliteal to peroneal bypass   Electronically Signed   By: Daryll Brod M.D.   On: 08/11/2013 13:40    Assessment/Plan:  POD #2   LOS: 4 days  s/p Procedure(s):   RIGHT - POPLITEAL TO PERONEAL ARTERY BYPASS GRAFT  WITH NONREVERSED SAPHENOUS VEIN GRAFT,tHROMBECTOMY ANTERIOR TIBIALIS,ATTEMPTED THROMBECTOMY TIBIO-PERONEAL TRUNK AND POSTERIOR TIBIALIS, INTRAOPERATIVE ARTERIOGRAM.  Plan transfer to 2 W. today and begin ambulation We'll continue IV heparin today and tomorrow can switch toXeralto per Dr. Audelia Acton Bypass functioning well. Hope to DC home on Tuesday.   Tinnie Gens, MD 08/13/2013 9:17 AM

## 2013-08-13 NOTE — Progress Notes (Signed)
    SUBJECTIVE:    No chest pain or SOB.  Leg feels much better.     PHYSICAL EXAM Filed Vitals:   08/12/13 1923 08/12/13 2320 08/13/13 0330 08/13/13 0805  BP: 146/55 135/57 155/57 162/68  Pulse: 79 73 87 104  Temp: 97.9 F (36.6 C) 98.2 F (36.8 C) 98.7 F (37.1 C) 97.9 F (36.6 C)  TempSrc: Oral Oral Oral Oral  Resp: 16 11 15 13   Height:      Weight:      SpO2: 97% 94% 96% 100%   General:  No acute distress Lungs:  Clear Heart:  RRR Abdomen:  Positive bowel sounds, no rebound no guarding Extremities:  Right foot warmer  LABS: No results found for this basename: TROPONINI   Results for orders placed during the hospital encounter of 08/09/13 (from the past 24 hour(s))  HEPARIN LEVEL (UNFRACTIONATED)     Status: None   Collection Time    08/12/13  5:40 PM      Result Value Ref Range   Heparin Unfractionated 0.37  0.30 - 0.70 IU/mL  CBC     Status: Abnormal   Collection Time    08/13/13  3:13 AM      Result Value Ref Range   WBC 8.4  4.0 - 10.5 K/uL   RBC 3.60 (*) 4.22 - 5.81 MIL/uL   Hemoglobin 10.0 (*) 13.0 - 17.0 g/dL   HCT 31.5 (*) 39.0 - 52.0 %   MCV 87.5  78.0 - 100.0 fL   MCH 27.8  26.0 - 34.0 pg   MCHC 31.7  30.0 - 36.0 g/dL   RDW 32.1 (*) 11.5 - 15.5 %   Platelets 256  150 - 400 K/uL  APTT     Status: Abnormal   Collection Time    08/13/13  3:13 AM      Result Value Ref Range   aPTT 57 (*) 24 - 37 seconds  HEPARIN LEVEL (UNFRACTIONATED)     Status: Abnormal   Collection Time    08/13/13  3:13 AM      Result Value Ref Range   Heparin Unfractionated 0.22 (*) 0.30 - 0.70 IU/mL    Intake/Output Summary (Last 24 hours) at 08/13/13 1018 Last data filed at 08/13/13 0900  Gross per 24 hour  Intake 1255.36 ml  Output   1375 ml  Net -119.64 ml    ASSESSMENT AND PLAN:  Arterial thromboembolism:  Mobile thrombus noted in the proximal descending aorta on TEE.  No intracardiac source.  Status post right pop to peroneal bypass, thrombectomy/attempted  thrombectomy.   Duplex with widely patent bypass.  Doppler with improved peroneal and post tib flow.  Continue IV heparin for one more day before starting Xarelto.  Transfer to telemetry.     Jeneen Rinks [facility] 08/13/2013 10:18 AM

## 2013-08-13 NOTE — Progress Notes (Signed)
ANTICOAGULATION CONSULT NOTE - Follow Up Consult  Pharmacy Consult for Heparin Indication: Multiple emboli to bilateral lower extremities.  No Known Allergies  Patient Measurements: Height: 6' (182.9 cm) Weight: 165 lb 5.5 oz (75 kg) IBW/kg (Calculated) : 77.6 Heparin Dosing Weight:   Vital Signs: Temp: 98.2 F (36.8 C) (08/09 1159) Temp src: Oral (08/09 1159) BP: 163/69 mmHg (08/09 1159) Pulse Rate: 80 (08/09 1159)  Labs:  Recent Labs  08/11/13 0400 08/11/13 1610 08/11/13 1945 08/12/13 0301 08/12/13 1740 08/13/13 0313 08/13/13 1349  HGB 10.6*  --  10.8* 10.0*  --  10.0*  --   HCT 33.0*  --  33.7* 31.6*  --  31.5*  --   PLT 289  --  253 261  --  256  --   APTT  --  31  --  22*  --  57*  --   LABPROT 14.9  --   --   --   --   --   --   INR 1.17  --   --   --   --   --   --   HEPARINUNFRC 0.27*  --   --  <0.10* 0.37 0.22* 0.31  CREATININE 0.83  --   --  0.78  --   --   --     Estimated Creatinine Clearance: 93.8 ml/min (by C-G formula based on Cr of 0.78).   Medications:  Scheduled:  . amLODipine  5 mg Oral Daily  . Budesonide  9 mg Oral Daily  . capecitabine  1,500 mg Oral QAC supper  . docusate sodium  100 mg Oral Daily  . pantoprazole  40 mg Oral Daily  . potassium chloride SA  20 mEq Oral Daily  . sodium chloride  3 mL Intravenous Q12H    Assessment: 68 yr old male with multiple emboli to the lower extremities now s/p bypass graft/thrombectomy on 8/7 and heparin was restarted at 600 units/hr post OR per MD . Pharmacy has been consulted to adjust heparin.  Patient was previously on heparin with rates ranging 1100-1300 units/hr were heparin levels ranged 0.15-0.7. Patient noted for Xarelto when more stable.  PM heparin level therapeutic  Goal of Therapy:  Heparin level 0.3-0.7 units/ml Monitor platelets by anticoagulation protocol: Yes   Plan:  -Continue heparin at 1350 units / hr -Will follow plans for Xarelto -Follow up AM labs  Thank you. Anette Guarneri, PharmD [phone]   08/13/2013 2:52 PM

## 2013-08-13 NOTE — Evaluation (Signed)
Physical Therapy Evaluation Patient Details Name: Cody Suarez MRN: [id number] DOB: Dec 15, 1945 Today's Date: 08/13/2013   History of Present Illness  patient is a 68 yo male s/p post right popliteal to peroneal bypass  Clinical Impression  Patient demonstrates deficits in functional mobility as indicated below Will need continued skilled PT to address deficits and maximize function. Will see as indicated and progress as tolerated.     Follow Up Recommendations Home health PT;Supervision/Assistance - 24 hour    Equipment Recommendations  Rolling walker with 5" wheels    Recommendations for Other Services       Precautions / Restrictions Precautions Precautions: Fall      Mobility  Bed Mobility Overal bed mobility: Needs Assistance Bed Mobility: Supine to Sit;Sit to Supine     Supine to sit: Min assist Sit to supine: Min assist   General bed mobility comments: Vcs for technique and assist for management of RLE, patient tolerated well, cues to come to EOB  Transfers Overall transfer level: Needs assistance Equipment used: Rolling walker (2 wheeled) Transfers: Sit to/from Stand Sit to Stand: Min assist         General transfer comment: VCs for hand placement and positioning at EOB, min assist for stability to elevate to standing.   Ambulation/Gait Ambulation/Gait assistance: Min assist Ambulation Distance (Feet): 30 Feet Assistive device: Rolling walker (2 wheeled) Gait Pattern/deviations: Step-to pattern;Decreased stride length;Trunk flexed Gait velocity: decreased Gait velocity interpretation: Below normal speed for age/gender General Gait Details: patient self restriciting WBing secondary to increased pain, cues for relaxation  Stairs            Wheelchair Mobility    Modified Rankin (Stroke Patients Only)       Balance                                             Pertinent Vitals/Pain Pain Assessment: 0-10 Pain Score: 5   Pain Descriptors / Indicators: Discomfort;[name] expects to be discharged to:: Private residence Living Arrangements: Spouse/significant other Available Help at Discharge: Family Type of Home: House Home Access: Stairs to enter Entrance Stairs-Rails: Can reach both Entrance Stairs-Number of Steps: 4 Home Layout: One level Home Equipment: None      Prior Function Level of Independence: Independent               Hand Dominance   Dominant Hand: Right    Extremity/Trunk Assessment   Upper Extremity Assessment: Overall WFL for tasks assessed           Lower Extremity Assessment: Generalized weakness;RLE deficits/detail RLE Deficits / Details: limited ROM, increased edema and pain       Communication   Communication: HOH (wears glasses)  Cognition Arousal/Alertness: Awake/alert Behavior During Therapy: WFL for tasks assessed/performed Overall Cognitive Status: Within Functional Limits for tasks assessed                      General Comments General comments (skin integrity, edema, etc.): RLE incision    Exercises        Assessment/Plan    PT Assessment Patient needs continued PT services  PT Diagnosis Difficulty walking;Abnormality of gait;Generalized weakness;Acute pain   PT Problem List Decreased strength;Decreased range of motion;Decreased activity tolerance;Decreased balance;Decreased mobility;Pain  PT Treatment Interventions DME instruction;Gait training;Stair training;Functional mobility training;Therapeutic activities;Therapeutic exercise;Balance training;Patient/family  education   PT Goals (Current goals can be found in the Care Plan section) Acute Rehab PT Goals Patient Stated Goal: to go home PT Goal Formulation: With patient Time For Goal Achievement: 08/27/13 Potential to Achieve Goals: Good    Frequency Min 4X/week   Barriers to discharge        Co-evaluation               End of Session  Equipment Utilized During Treatment: Gait belt Activity Tolerance: Patient tolerated treatment well;Patient limited by pain Patient left: in bed;with call bell/phone within reach Nurse Communication: Mobility status         Time: 0926-0952 PT Time Calculation (min): 26 min   Charges:   PT Evaluation $Initial PT Evaluation Tier I: 1 Procedure PT Treatments $Gait Training: 8-22 mins $Therapeutic Activity: 8-22 mins   PT G CodesDuncan Dull 08/13/2013, 10:54 AM Alben Deeds, PT DPT  [phone]

## 2013-08-13 NOTE — Progress Notes (Signed)
ANTICOAGULATION CONSULT NOTE - Follow Up Consult  Pharmacy Consult for Heparin  Indication: Multiple emboli to bilateral lower extremities (vascular surgery)  No Known Allergies  Patient Measurements: Height: 6' (182.9 cm) Weight: 165 lb 5.5 oz (75 kg) IBW/kg (Calculated) : 77.6  Vital Signs: Temp: 98.7 F (37.1 C) (08/09 0330) Temp src: Oral (08/09 0330) BP: 155/57 mmHg (08/09 0330) Pulse Rate: 87 (08/09 0330)  Labs:  Recent Labs  08/11/13 0400 08/11/13 1610 08/11/13 1945 08/12/13 0301 08/12/13 1740 08/13/13 0313  HGB 10.6*  --  10.8* 10.0*  --  10.0*  HCT 33.0*  --  33.7* 31.6*  --  31.5*  PLT 289  --  253 261  --  256  APTT  --  31  --  22*  --  57*  LABPROT 14.9  --   --   --   --   --   INR 1.17  --   --   --   --   --   HEPARINUNFRC 0.27*  --   --  <0.10* 0.37 0.22*  CREATININE 0.83  --   --  0.78  --   --     Estimated Creatinine Clearance: 93.8 ml/min (by C-G formula based on Cr of 0.78).   Medications:  Heparin 1200 units/hr  Assessment: 68 y/o M with sub-therapeutic heparin level (HL 0.22) Vascular surgery following, other labs as above.  Goal of Therapy:  Heparin level 0.3-0.7 units/ml Monitor platelets by anticoagulation protocol: Yes   Plan:  -Increase heparin drip to 1350 units/hr -1400 HL -Daily CBC/HL -Monitor for bleeding  Narda Bonds 08/13/2013,5:40 AM

## 2013-08-13 NOTE — Progress Notes (Signed)
VASCULAR LAB PRELIMINARY  PRELIMINARY  PRELIMINARY  PRELIMINARY  Recheck of bypass completed.    Preliminary report: Bypass still open.  Peroneal flow noted.   Ebonique Hallstrom, RVT 08/13/2013, 7:36 AM

## 2013-08-14 ENCOUNTER — Encounter (HOSPITAL_COMMUNITY): Payer: Self-pay | Admitting: Vascular Surgery

## 2013-08-14 DIAGNOSIS — I10 Essential (primary) hypertension: Secondary | ICD-10-CM | POA: Diagnosis present

## 2013-08-14 DIAGNOSIS — I700 Atherosclerosis of aorta: Secondary | ICD-10-CM

## 2013-08-14 DIAGNOSIS — I719 Aortic aneurysm of unspecified site, without rupture: Secondary | ICD-10-CM | POA: Diagnosis present

## 2013-08-14 LAB — APTT: APTT: 81 s — AB (ref 24–37)

## 2013-08-14 LAB — CBC
HCT: 28.4 % — ABNORMAL LOW (ref 39.0–52.0)
HEMOGLOBIN: 9 g/dL — AB (ref 13.0–17.0)
MCH: 27.1 pg (ref 26.0–34.0)
MCHC: 31.7 g/dL (ref 30.0–36.0)
MCV: 85.5 fL (ref 78.0–100.0)
Platelets: 266 10*3/uL (ref 150–400)
RBC: 3.32 MIL/uL — AB (ref 4.22–5.81)
RDW: 31.7 % — ABNORMAL HIGH (ref 11.5–15.5)
WBC: 8.5 10*3/uL (ref 4.0–10.5)

## 2013-08-14 LAB — HEPARIN LEVEL (UNFRACTIONATED): HEPARIN UNFRACTIONATED: 0.28 [IU]/mL — AB (ref 0.30–0.70)

## 2013-08-14 MED ORDER — RIVAROXABAN 20 MG PO TABS
20.0000 mg | ORAL_TABLET | Freq: Every day | ORAL | Status: DC
  Administered 2013-08-14: 20 mg via ORAL
  Filled 2013-08-14 (×2): qty 1

## 2013-08-14 MED ORDER — RIVAROXABAN 20 MG PO TABS: 20.0000 mg | ORAL_TABLET | Freq: Every day | ORAL | Status: DC

## 2013-08-14 MED ORDER — HYDROCODONE-ACETAMINOPHEN 5-325 MG PO TABS: 1.0000 | ORAL_TABLET | Freq: Two times a day (BID) | ORAL | Status: DC | PRN

## 2013-08-14 MED ORDER — AMLODIPINE BESYLATE 5 MG PO TABS: 5.0000 mg | ORAL_TABLET | Freq: Every day | ORAL | Status: DC

## 2013-08-14 MED ORDER — BISACODYL 10 MG RE SUPP: 10.0000 mg | Freq: Every day | RECTAL | Status: DC | PRN

## 2013-08-14 NOTE — Progress Notes (Signed)
Physical Therapy Treatment Patient Details Name: Cody Suarez MRN: [id number] DOB: [birth year] Today's Date: 08/14/2013    History of Present Illness Cody Suarez is a 68 y.o. Male s/p Rt popliteal to peroneal bypass.     PT Comments    Pt very agreeable for mobility, despite pain and fatigue.  Pt indicates plan for D/C tomorrow.  Will continue to follow.    Follow Up Recommendations  Home health PT;Supervision/Assistance - 24 hour     Equipment Recommendations  Rolling walker with 5" wheels    Recommendations for Other Services       Precautions / Restrictions Precautions Precautions: Fall Precaution Comments: Discussed positioning of R LE.   Restrictions Weight Bearing Restrictions: No    Mobility  Bed Mobility Overal bed mobility: Modified Independent                Transfers Overall transfer level: Needs assistance Equipment used: Rolling walker (2 wheeled) Transfers: Sit to/from Stand Sit to Stand: Min guard;Min assist         General transfer comment: MinA from low toilet, otherwise MinG.  Demos good use of UEs.    Ambulation/Gait Ambulation/Gait assistance: Min guard Ambulation Distance (Feet): 60 Feet Assistive device: Rolling walker (2 wheeled) Gait Pattern/deviations: Step-to pattern;Decreased step length - left;Decreased stance time - right     General Gait Details: pt tends to keep R LE in TDWBing and needs encouragement to increase WBing on R LE.  Cues for positioning within RW.     Stairs Stairs: Yes Stairs assistance: Mod assist Stair Management: One rail Right;Step to pattern;Forwards Number of Stairs: 4 General stair comments: pt utilized PT on L side as he would his 2nd rail at home.  cues for sequencing and technique.    Wheelchair Mobility    Modified Rankin (Stroke Patients Only)       Balance                                    Cognition Arousal/Alertness: Awake/alert Behavior During Therapy: WFL  for tasks assessed/performed Overall Cognitive Status: Within Functional Limits for tasks assessed                      Exercises      General Comments        Pertinent Vitals/Pain Pain Assessment: 0-10 Pain Score: 6  Pain Location: R LE Pain Descriptors / Indicators: Discomfort Pain Intervention(s): Patient requesting pain meds-RN notified    Home Living                      Prior Function            PT Goals (current goals can now be found in the care plan section) Acute Rehab PT Goals Patient Stated Goal: to go home PT Goal Formulation: With patient Time For Goal Achievement: 08/27/13 Potential to Achieve Goals: Good Progress towards PT goals: Progressing toward goals    Frequency  Min 4X/week    PT Plan Current plan remains appropriate    Co-evaluation             End of Session Equipment Utilized During Treatment: Gait belt Activity Tolerance: Patient limited by fatigue;Patient limited by pain Patient left: in bed;with call bell/phone within reach;with family/visitor present     Time: 0911-0953 PT Time Calculation (min): 42 min  Charges:  $Gait Training:  23-37 mins $Therapeutic Activity: 8-22 mins                    G CodesCatarina Hartshorn, [place] [phone] 08/14/2013, 11:16 AM

## 2013-08-14 NOTE — Progress Notes (Addendum)
     Patient Name: Cody Suarez Date of Encounter: 08/14/2013  Principal Problem:   Arterial thromboembolism Active Problems:   Hypertension    Patient Profile: 68yo male w/ hx calf claudication, HTN, was admitted 08/05 for LE angio-->bilat LE thrombus below the knee, mobile thrombus in Ao by echo. [facility]VS did R pop-peroneal BPG 08/07.  SUBJECTIVE: Pain control improving. Moving around with a walker. Agreeable to [facility] PT. Has friends and wife to help with care.  OBJECTIVE Filed Vitals:   08/13/13 1100 08/13/13 1159 08/13/13 2019 08/14/13 0425  BP:  163/69 138/70 145/69  Pulse:  80 84 80  Temp:  98.2 F (36.8 C) 99.1 F (37.3 C) 99.2 F (37.3 C)  TempSrc:  Oral Oral Oral  Resp:  18 19 20   Height:      Weight: 165 lb 5.5 oz (75 kg)   158 lb 11.2 oz (71.986 kg)  SpO2:  100% 96% 96%    Intake/Output Summary (Last 24 hours) at 08/14/13 0724 Last data filed at 08/14/13 0447  Gross per 24 hour  Intake     27 ml  Output    500 ml  Net   -473 ml   Filed Weights   08/12/13 1200 08/13/13 1100 08/14/13 0425  Weight: 165 lb 5.5 oz (75 kg) 165 lb 5.5 oz (75 kg) 158 lb 11.2 oz (71.986 kg)    PHYSICAL EXAM General: Well developed, well nourished, male in no acute distress. Head: Normocephalic, atraumatic.  Neck: Supple without bruits, JVD not elevated. Lungs:  Resp regular and unlabored, few rales bases. Heart: RRR, S1, S2, no S3, S4, soft murmur; no rub. Abdomen: Soft, non-tender, non-distended, BS + x 4.  Extremities: No clubbing, cyanosis, 1+ edema right, - left, incision healing well Neuro: Alert and oriented X 3. Moves all extremities spontaneously. Psych: Normal affect.  LABS: CBC:  Recent Labs  08/13/13 0313 08/14/13 0523  WBC 8.4 8.5  HGB 10.0* 9.0*  HCT 31.5* 28.4*  MCV 87.5 85.5  PLT 256 276   Basic Metabolic Panel:  Recent Labs  08/12/13 0301  NA 139  K 4.1  CL 105  CO2 21  GLUCOSE 110*  BUN 13  CREATININE 0.78  CALCIUM 7.8*   TELE:  SR, ST       Current Medications:  . amLODipine  5 mg Oral Daily  . Budesonide  9 mg Oral Daily  . docusate sodium  100 mg Oral Daily  . pantoprazole  40 mg Oral Daily  . potassium chloride SA  20 mEq Oral Daily  . sodium chloride  3 mL Intravenous Q12H   . sodium chloride 50 mL/hr at 08/12/13 1700  . DOPamine Stopped (08/11/13 1830)  . heparin 1,350 Units/hr (08/13/13 1943)    ASSESSMENT AND PLAN: Principal Problem:   Arterial thromboembolism - mgt per [facility]VS, possible d/c today; will discuss Xarelto w/ pharmacy.  Active Problems:   Hypertension - SBP occ 120s, generally 130s-140s on current rx. Med changes per MD.  Plan - d/c home if OK with [facility] and anticoag arranged.  Jonetta Speak , PA-C 7:24 AM 08/14/2013

## 2013-08-14 NOTE — Discharge Instructions (Signed)
Rivaroxaban oral tablets What is this medicine? RIVAROXABAN (ri va ROX a ban) is an anticoagulant (blood thinner). It is used to treat blood clots in the lungs or in the veins. It is also used after knee or hip surgeries to prevent blood clots. It is also used to lower the chance of stroke in people with a medical condition called atrial fibrillation. This medicine may be used for other purposes; ask your health care provider or pharmacist if you have questions. COMMON BRAND NAME(S): Xarelto, Xarelto Starter Pack What should I tell my health care provider before I take this medicine? They need to know if you have any of these conditions: -bleeding disorders -bleeding in the brain -blood in your stools (black or tarry stools) or if you have blood in your vomit -history of stomach bleeding -kidney disease -liver disease -low blood counts, like low white cell, platelet, or red cell counts -recent or planned spinal or epidural procedure -take medicines that treat or prevent blood clots -an unusual or allergic reaction to rivaroxaban, other medicines, foods, dyes, or preservatives -pregnant or trying to get pregnant -breast-feeding How should I use this medicine? Take this medicine by mouth with a glass of water. Follow the directions on the prescription label. Take your medicine at regular intervals. Do not take it more often than directed. Do not stop taking except on your doctor's advice. Stopping this medicine may increase your risk of a blot clot. Be sure to refill your prescription before you run out of medicine. If you are taking this medicine after hip or knee replacement surgery, take it with or without food. If you are taking this medicine for atrial fibrillation, take it with your evening meal. If you are taking this medicine to treat blood clots, take it with food at the same time each day. If you are unable to swallow your tablet, you may crush the tablet and mix it in applesauce. Then,  immediately eat the applesauce. You should eat more food right after you eat the applesauce containing the crushed tablet. Talk to your pediatrician regarding the use of this medicine in children. Special care may be needed. Overdosage: If you think you have taken too much of this medicine contact a poison control center or emergency room at once. NOTE: This medicine is only for you. Do not share this medicine with others. What if I miss a dose? If you take your medicine once a day and miss a dose, take the missed dose as soon as you remember. If you take your medicine twice a day and miss a dose, take the missed dose immediately. In this instance, 2 tablets may be taken at the same time. The next day you should take 1 tablet twice a day as directed. What may interact with this medicine? -aspirin and aspirin-like medicines -certain antibiotics like erythromycin, azithromycin, and clarithromycin -certain medicines for fungal infections like ketoconazole and itraconazole -certain medicines for irregular heart beat like amiodarone, quinidine, dronedarone -certain medicines for seizures like carbamazepine, phenytoin -certain medicines that treat or prevent blood clots like warfarin, enoxaparin, and dalteparin -conivaptan -diltiazem -felodipine -indinavir -lopinavir; ritonavir -NSAIDS, medicines for pain and inflammation, like ibuprofen or naproxen -ranolazine -rifampin -ritonavir -St. John's wort -verapamil This list may not describe all possible interactions. Give your health care provider a list of all the medicines, herbs, non-prescription drugs, or dietary supplements you use. Also tell them if you smoke, drink alcohol, or use illegal drugs. Some items may interact with your medicine. What  should I watch for while using this medicine? Visit your doctor or health care professional for regular checks on your progress. Your condition will be monitored carefully while you are receiving this  medicine. Notify your doctor or health care professional and seek emergency treatment if you develop breathing problems; changes in vision; chest pain; severe, sudden headache; pain, swelling, warmth in the leg; trouble speaking; sudden numbness or weakness of the face, arm, or leg. These can be signs that your condition has gotten worse. If you are going to have surgery, tell your doctor or health care professional that you are taking this medicine. Tell your health care professional that you use this medicine before you have a spinal or epidural procedure. Sometimes people who take this medicine have bleeding problems around the spine when they have a spinal or epidural procedure. This bleeding is very rare. If you have a spinal or epidural procedure while on this medicine, call your health care professional immediately if you have back pain, numbness or tingling (especially in your legs and feet), muscle weakness, paralysis, or loss of bladder or bowel control. Avoid sports and activities that might cause injury while you are using this medicine. Severe falls or injuries can cause unseen bleeding. Be careful when using sharp tools or knives. Consider using an Copy. Take special care brushing or flossing your teeth. Report any injuries, bruising, or red spots on the skin to your doctor or health care professional. What side effects may I notice from receiving this medicine? Side effects that you should report to your doctor or health care professional as soon as possible: -allergic reactions like skin rash, itching or hives, swelling of the face, lips, or tongue -back pain -redness, blistering, peeling or loosening of the skin, including inside the mouth -signs and symptoms of bleeding such as bloody or black, tarry stools; red or dark-brown urine; spitting up blood or brown material that looks like coffee grounds; red spots on the skin; unusual bruising or bleeding from the eye, gums, or  nose Side effects that usually do not require medical attention (Report these to your doctor or health care professional if they continue or are bothersome.): -dizziness -muscle pain This list may not describe all possible side effects. Call your doctor for medical advice about side effects. You may report side effects to FDA at 1-800-FDA-1088. Where should I keep my medicine? Keep out of the reach of children. Store at room temperature between 15 and 30 degrees C (59 and 86 degrees F). Throw away any unused medicine after the expiration date. NOTE: This sheet is a summary. It may not cover all possible information. If you have questions about this medicine, talk to your doctor, pharmacist, or health care provider.  2015, [employer]. (2013-04-13 18:47:48)

## 2013-08-14 NOTE — Discharge Summary (Signed)
CARDIOLOGY DISCHARGE SUMMARY   Patient ID: Cody Suarez MRN: [id number] DOB/AGE: [birth year] 69 y.o.  Admit date: 08/09/2013 Discharge date: 08/14/2013  PCP: Florina Ou, MD Primary Cardiologist: Dr. Fletcher Anon  Primary Discharge Diagnosis:  Arterial thromboembolism Secondary Discharge Diagnosis:    Hypertension   Penetrating atherosclerotic ulcer of aorta  Consults: Dr. Kellie Simmering with vascular surgery  Procedures: Abdominal aortic angiogram with by iliofemoral runoff, bilateral lower extremity angiography, lower extremity arterial Dopplers, lower extremity venous Dopplers, ABIs, 2-D echocardiogram, transesophageal echocardiogram, CT of the chest abdomen and pelvis with contrast, right popliteal-peroneal artery bypass graft with non-reversed SVG, thrombectomy anterior tibialis, attempted thrombectomy to be oh-peroneal trunk and posterior tibialis, intraoperative arteriogram  Hospital Course: Cody Suarez is a 68 y.o. male with a recent diagnosis of PDD. He was evaluated by Dr. Fletcher Anon for claudication symptoms and decreased ABIs bilaterally. He was scheduled for an angiogram and came to the hospital for the procedure on 08/09/2013.  Catheterization results are below. The 2 issues are assessing the source of his embolization and consulting surgery for revascularization options.  A 2-D echocardiogram was performed, results below. He had no obvious source of emboli in his EF was normal. Therefore, no further ischemic workup was indicated prior to surgery, but a TEE was needed to further evaluate for thrombus sources.  A TEE was performed with the results below. Thrombus was seen in the descending aorta so a CT angiogram of the chest abdomen and pelvis was performed to further evaluate this.  Those results are below, thrombus in the aorta was seen. He also possibly has a small splenic infarct. There is an abnormality in the right middle lobe and a followup chest CT in 6-12 months is  recommended.  With the thrombus, he was started immediately on heparin. A PDS consult was called and he was seen by Dr. Kellie Simmering. Ultrasounds were performed preoperatively, both arterial and venous as well as ABIs. They showed the significantly decreased circulation. He was taken to surgery on 08/07.    He had the procedures listed above, and tolerated them well. After the surgery, he was restarted on heparin. His right lower extremity had a 2+ to cells pedis pulse and good Doppler flow in the peroneal artery. Dr. Kellie Simmering recommended heparin for 48 hours, then changing to oral anticoagulation.  Postoperative ABIs were performed, which shows the left of 0.62. Adequate flow noted throughout the common femoral and femoral arteries. Bypass appears open, but no flow noted in the anterior tibial arteries, or peroneal arteries. Dampened monophasic flow noted in the posterior tibial artery by imaging as well as by hand held Doppler.   these results were reviewed and followed during his hospital stay. On recheck, the bypass was open and peroneal flow was noted. Peroneal flow continued to improve during his hospital stay.  He was seen by physical therapy and occupational therapy. It was felt that he would benefit from outpatient PT and OT, this was arranged. He needed a walker and this was provided. He had good pain control. The situation was reviewed with the patient and made arrangements between family and friends to provide them with 24-hour supervision. He continued to work with PT and OT during his stay and was making progress.  On 08/10, he was seen by Dr. Debara Pickett and Dr. Kellie Simmering, and all data were reviewed. He was cleared to start oral anti-coagulation by Dr. Kellie Simmering and arrangements were made for Xarelto. Dr. Berdine Addison she did not feel any further inpatient workup  was indicated and he was considered stable for discharge, to follow up as an outpatient.   Labs:  Lab Results  Component Value Date   WBC 8.5  08/14/2013   HGB 9.0* 08/14/2013   HCT 28.4* 08/14/2013   MCV 85.5 08/14/2013   PLT 266 08/14/2013    Recent Labs Lab 08/10/13 0422  08/12/13 0301  NA 142  < > 139  K 4.4  < > 4.1  CL 107  < > 105  CO2 24  < > 21  BUN 13  < > 13  CREATININE 0.89  < > 0.78  CALCIUM 8.3*  < > 7.8*  PROT 6.3  --   --   BILITOT 0.7  --   --   ALKPHOS 64  --   --   ALT 10  --   --   AST 13  --   --   GLUCOSE 99  < > 110*  < > = values in this interval not displayed.  Lipid Panel     Component Value Date/Time   CHOL 141 08/10/2013 0422   TRIG 85 08/10/2013 0422   HDL 59 08/10/2013 0422   CHOLHDL 2.4 08/10/2013 0422   VLDL 17 08/10/2013 0422   LDLCALC 65 08/10/2013 0422   Radiology: Dg Ang/ext/uni/or Right 08/11/2013   CLINICAL DATA:  Popliteal to peroneal bypass  EXAM: RIGHT ANG/EXT/UNI/ OR  TECHNIQUE: Intraoperative single view angiogram  CONTRAST:  See operative note  FLUOROSCOPY TIME:  See operative note  COMPARISON:  None available  FINDINGS: Limited portable AP view of the right lower extremity performed over the calf at the distal anastomosis. The popliteal to peroneal bypass appears patent. Proximal and distal anastomoses are patent. Single peroneal runoff. Postop changes of soft tissues.  IMPRESSION: Patent popliteal to peroneal bypass   Electronically Signed   By: Daryll Brod M.D.   On: 08/11/2013 13:40   Ct Angio Chest Aortic Dissect W &/or W/o 08/10/2013   ADDENDUM REPORT: 08/10/2013 13:24  ADDENDUM: Findings in the posterior aortic arch and proximal descending thoracic aorta are suggestive for an ulcerated plaque with thrombus. The irregular thrombus is distal to the left subclavian artery and left vertebral artery. Difficult to measure the distance from the thrombus and the left vertebral artery but it is probably less than 2 cm.   Electronically Signed   By: Markus Daft M.D.   On: 08/10/2013 13:24  08/10/2013   CLINICAL DATA:  Bilateral calf pain. Evaluate for of mobile thrombus in the proximal descending  aorta seen on echocardiogram.  EXAM: CT ANGIOGRAPHY CHEST, ABDOMEN AND PELVIS  TECHNIQUE: Multidetector CT imaging through the chest, abdomen and pelvis was performed using the standard protocol during bolus administration of intravenous contrast. Multiplanar reconstructed images and MIPs were obtained and reviewed to evaluate the vascular anatomy.  CONTRAST:  180m OMNIPAQUE IOHEXOL 350 MG/ML SOLN  COMPARISON:  Abdominal CT 03/20/2013  FINDINGS: CTA CHEST FINDINGS  The ascending thoracic aorta measures up to 3.0 cm without a dissection. Common origin to the left common carotid artery and right innominate artery. The left vertebral artery originates from the aortic arch. Mild atherosclerotic disease at the origin the left subclavian artery. A small amount of irregular mural thrombus along the left side of the aortic arch. This thrombus may be partially ulcerated. There is a small finger-like thrombus that projects into the aortic lumen and roughly measures 1 cm in length. This probably represents the mobile thrombus seen on the echocardiogram. No  other significant thrombus identified in the thoracic aorta. The descending thoracic aorta measures up to 2.7 cm.  The main pulmonary arteries are patent. No significant pericardial or pleural fluid. There is no significant chest lymphadenopathy.  The trachea and mainstem bronchi are patent. There is a poorly defined linear structure in the right middle lobe on sequence 6, images 84 and 83. This structure measures roughly 0.5 cm. This appears to be stable from the previous examination but it was incompletely evaluated on the prior examination. There are subtle ground-glass densities in the lingula which are nonspecific. Otherwise, no large areas of airspace disease or consolidation.  Review of the MIP images confirms the above findings.  CTA ABDOMEN AND PELVIS FINDINGS  Atherosclerotic calcifications in the abdominal aorta without aneurysm. A small amount of mural thrombus  along the posterior aspect of the infrarenal abdominal aorta. The left iliac arteries are widely patent without significant plaque or narrowing. There is edema around the right common femoral artery and probably related to recent surgery or catheterization. There is intraluminal thrombus within a right profunda femoral artery branch. The proximal right SFA is patent. The proximal left femoral arteries are patent.  No gross abnormality to the liver. There is a calcified gallstone but no evidence for gallbladder inflammation. There appears to be decreased perfusion along the superior aspect of the spleen and this could represent a focal infarct. Normal appearance of the pancreas and adrenal glands. The may be a 0.3 cm stone in the left kidney lower pole without hydronephrosis. The no perfusion abnormalities in the kidneys. Evidence for at least 2 stones in the right kidney lower pole and the largest measures 0.7 cm.  The celiac trunk and main branches are patent. The SMA and main branches are patent. Inferior mesenteric artery is patent. There is atherosclerotic disease at the origin of the main left renal artery. There is an accessory inferior left renal artery. Atherosclerotic disease at the origin of the right renal artery with an early bifurcation but no significant stenosis.  No significant free fluid or lymphadenopathy. There is mild stranding within the central abdominal mesentery which is unchanged. Urinary bladder is moderately distended. Patient had a partial colonic resection with the anastomosis in the left transverse colon region. No evidence for bowel obstruction.  Multilevel degenerative changes in the spine.  Review of the MIP images confirms the above findings.  IMPRESSION: Small amount of irregular thrombus in the posterior aortic arch. There is a small "finger-like" thrombus projecting into the aortic lumen.  Decreased perfusion along the superior aspect of the spleen could represent a small  infarct.  Small amount of mural thrombus in the abdominal aorta without aneurysm.  Age-indeterminate thrombus within a right profunda femoral artery branch.  Bilateral renal stones without hydronephrosis.  Cholelithiasis.  Small area of ground-glass disease in the lingula is nonspecific. This could represent atelectasis.  Nonspecific 0.5 cm linear density in the right middle lobe. If the patient is at high risk for bronchogenic carcinoma, follow-up chest CT at 6-12 months is recommended. If the patient is at low risk for bronchogenic carcinoma, follow-up chest CT at 12 months is recommended. This recommendation follows the consensus statement: Guidelines for Management of Small Pulmonary Nodules Detected on CT Scans: A Statement from the [place] as published in Radiology 2005;237:395-400.  Electronically Signed: By: Markus Daft M.D. On: 08/10/2013 12:23   Ct Angio Abd/pel W/ And/or W/o 08/10/2013   ADDENDUM REPORT: 08/10/2013 13:24  ADDENDUM: Findings in the posterior aortic  arch and proximal descending thoracic aorta are suggestive for an ulcerated plaque with thrombus. The irregular thrombus is distal to the left subclavian artery and left vertebral artery. Difficult to measure the distance from the thrombus and the left vertebral artery but it is probably less than 2 cm.   Electronically Signed   By: Markus Daft M.D.   On: 08/10/2013 13:24    PV Cath: 08/09/2013 Findings:   Abdominal aorta: Normal with no evidence of aneurysm.  Left renal artery: Normal  Right renal artery: Normal  Celiac artery: Patent  Superior mesenteric artery: Normal  Right common iliac artery: Normal  Right internal iliac artery: Normal  Right external iliac artery: Normal  Right common femoral artery: Normal  Right profunda femoral artery: Normal proximally with occlusion distally likely due to embolization.  Right superficial femoral artery: Normal  Right popliteal artery: Normal proximally but then is occluded  distally.  This seems to be only 1 vessel runoff below the knee via collaterals reconstituting that peroneal artery.  Left common iliac artery: Normal  Left internal iliac artery: Normal  Left external iliac artery: Normal  Left common femoral artery: Normal  Left profunda femoral artery: normal proximally with occlusion distally with evidence of organized thrombus.  Left superficial femoral artery: Normal  Left popliteal artery: Normal  Left tibial peroneal trunk: Normal  Left anterior tibial artery: Occluded proximally  Left peroneal artery: Occluded proximally  Left posterior tibial artery: Occluded proximally.  Reconstitution via collaterals into the peroneal artery which seems to be the only patent vessel to the foot. Conclusions:  1. No significant aortoiliac disease.  2. 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 This is highly suggestive of distal embolization. One-vessel runoff below the knee bilaterally via the peroneal artery which is occluded proximally but does reconstitute via collaterals.  Recommendations:  We need to evaluate for source of embolization. A TEE telemetry is recommended. Hypercoagulable workup is recommended although his colon cancer might be the culprit.  Due to presence of thrombus and severe disease, I recommend hospital admission. Start IV Heparin until source of embolization is evaluated.  Get TTE today and likely TEE tomorrow. IF EF is normal, no need for ischemic cardiac work before surgery given lack of anginal symptoms and risk factors.  I consulted Dr. Kellie Simmering and discussed the case with him.   EKG: 08/11/2013 Sinus rhythm, no acute changes next Vent. rate 61 BPM PR interval 132 ms QRS duration 80 ms QT/QTc 448/450 ms P-R-T axes 47 -19 40  Echo:  08/10/2013 Conclusions next - Left ventricle: The cavity size was normal. Wall thickness was increased in a pattern of mild LVH.  Systolic function was normal. The estimated ejection fraction was in the range of 55% to 60%. Wall motion was normal; there were no regional wall motion abnormalities. Doppler parameters are consistent with abnormal left ventricular relaxation (grade 1 diastolic dysfunction). The E/e&' ratio is <8, suggesting normal LV filling pressure. - Aorta: Poorly visualized. Cannot exclude plaque in the aortic arch. - Left atrium: The atrium was normal in size. Impressions: - No cardiac source of emboli was indentified.  TEE: 08/10/2013 Conclusion - Left ventricle: Systolic function was normal. The estimated ejection fraction was in the range of 55% to 60%. Wall motion was normal; there were no regional wall motion abnormalities. - Aortic valve: There was trivial regurgitation. - Descending aorta: There was a mobile pedunculated thrombus (0.7 x 0.5cm) in proximal  descending aorta with base comprised of raised moderate sized plaque. - Mitral valve: There was mild regurgitation. - Left atrium: No evidence of thrombus in the atrial cavity or appendage. - Right atrium: No evidence of thrombus in the atrial cavity or appendage. - Atrial septum: No defect or patent foramen ovale was identified. Impressions: - Discussed with Dr. Fletcher Anon and Dr. Kellie Simmering and patient. Obtaining CTA or aorta.   FOLLOW UP PLANS AND APPOINTMENTS No Known Allergies   Medication List         acetaminophen 500 MG tablet  Commonly known as:  TYLENOL  Take 1,000 mg by mouth 3 (three) times daily as needed (pain).     amLODipine 5 MG tablet  Commonly known as:  NORVASC  Take 1 tablet (5 mg total) by mouth daily.     aspirin EC 81 MG tablet  Take 81 mg by mouth daily.     bisacodyl 10 MG suppository  Commonly known as:  DULCOLAX  Place 1 suppository (10 mg total) rectally daily as needed for moderate constipation.     capecitabine 500 MG tablet  Commonly known as:  XELODA  Take 1,500-2,000 mg by mouth See admin  instructions. Take 4 tablets (2000 mg) every morning and 3 tablets (1500 mg) every night for 14 days, hold for 7 days, then repeat     HYDROcodone-acetaminophen 5-325 MG per tablet  Commonly known as:  NORCO/VICODIN  Take 1 tablet by mouth 2 (two) times daily as needed for moderate pain.     potassium chloride SA 20 MEQ tablet  Commonly known as:  K-DUR,KLOR-CON  Take 1 tablet (20 mEq total) by mouth daily.     rivaroxaban 20 MG Tabs tablet  Commonly known as:  XARELTO  Take 1 tablet (20 mg total) by mouth daily with breakfast.     sildenafil 50 MG tablet  Commonly known as:  VIAGRA  Take 50 mg by mouth daily as needed for erectile dysfunction.     UCERIS 9 MG Tb24  Generic drug:  Budesonide  Take 9 mg by mouth daily.     VITAMIN B-12 SL  Place 1 tablet under the tongue daily.        Discharge Instructions   Diet - low sodium heart healthy    Complete by:  As directed      Increase activity slowly    Complete by:  As directed           Follow-up Information   Follow up with Tinnie Gens, MD In 2 weeks. (Office will call you to arrange your appt (sent))    Specialty:  Vascular Surgery   Contact information:   [place] [address] [phone]       Follow up with Kathlyn Sacramento, MD On 08/22/2013. (at 9:00 am)    Specialty:  Cardiology   Contact information:   [address] [place] [address] [phone]       BRING ALL MEDICATIONS WITH YOU TO FOLLOW UP APPOINTMENTS  Time spent with patient to include physician time: 48 min Signed: Rosaria Ferries, PA-C 08/14/2013, 3:54 PM Co-Sign MD

## 2013-08-14 NOTE — Progress Notes (Addendum)
     Subjective  - Yes I'm ready to go home.  No new complaints.   Objective 145/69 80 99.2 F (37.3 C) (Oral) 20 96%  Intake/Output Summary (Last 24 hours) at 08/14/13 0743 Last data filed at 08/14/13 0447  Gross per 24 hour  Intake     27 ml  Output    500 ml  Net   -473 ml    Right LE incisions C/D/I No edema Skin warm well perfused, palpable popliteal graft.  Biphasic doppler right PT signals.   Assessment/Planning: POD #5 RIGHT - POPLITEAL TO PERONEAL ARTERY BYPASS GRAFT WITH NONREVERSED SAPHENOUS VEIN GRAFT,tHROMBECTOMY ANTERIOR TIBIALIS,ATTEMPTED THROMBECTOMY TIBIO-PERONEAL TRUNK AND POSTERIOR TIBIALIS, INTRAOPERATIVE ARTERIOGRAM.  F/U in 2 weeks with Dr. Kellie Simmering Elevation when at rest slowly increase activity.   Laurence Slate [facility] 08/14/2013 7:43 AM --  Laboratory Lab Results:  Recent Labs  08/13/13 0313 08/14/13 0523  WBC 8.4 8.5  HGB 10.0* 9.0*  HCT 31.5* 28.4*  PLT 256 266   BMET  Recent Labs  08/12/13 0301  NA 139  K 4.1  CL 105  CO2 21  GLUCOSE 110*  BUN 13  CREATININE 0.78  CALCIUM 7.8*    COAG Lab Results  Component Value Date   INR 1.17 08/11/2013   INR 1.10 08/10/2013   INR 1.29 08/09/2013   No results found for this basename: PTT   Good Doppler flow posterior tibial artery and ankle which is collateral 2+ popliteal graft pulse Right foot well-perfused Okay to DC heparin and changed to [place] The patient feels comfortable with ambulation okay to DC home today if okay with cardiology Followup with me in 2 weeks

## 2013-08-14 NOTE — Progress Notes (Addendum)
Pt. Seen and examined. Agree with the NP/PA-C note as written.  Arterial thromboembolism due to aortic ulceration and mobile plaque in the proximal descending aorta. Now on xarelto. [place] for d/c per [facility]. Will need repeat H/H later this week as H/H mildly decreased this am and he is going on Xarelto. Otherwise, I agree that discharge today is reasonable.   Pixie Casino, MD, [facility] Attending Cardiologist [place]

## 2013-08-15 ENCOUNTER — Telehealth: Payer: Self-pay | Admitting: Vascular Surgery

## 2013-08-15 NOTE — Telephone Encounter (Signed)
Message copied by Gena Fray on Tue Aug 15, 2013  2:04 PM ------      Message from: Mena Goes      Created: Tue Aug 15, 2013 10:57 AM      Regarding: add lab       Dr. Kellie Simmering wants to have this patient get an ABI at this postop appt.             ----- Message -----         From: Gabriel Earing, PA-C         Sent: 08/13/2013  10:21 AM           To: [facility] Charge Pool            S/p right popliteal to peroneal bypass on 08/11/13.  F/u with Dr. Kellie Simmering in 2 weeks.            Thanks,      Aldona Bar       ------

## 2013-08-15 NOTE — Telephone Encounter (Signed)
Spoke with pt to schedule appointment, dpm

## 2013-08-18 ENCOUNTER — Other Ambulatory Visit (HOSPITAL_BASED_OUTPATIENT_CLINIC_OR_DEPARTMENT_OTHER): Payer: BC Managed Care – PPO

## 2013-08-18 ENCOUNTER — Telehealth: Payer: Self-pay | Admitting: *Deleted

## 2013-08-18 ENCOUNTER — Ambulatory Visit (HOSPITAL_BASED_OUTPATIENT_CLINIC_OR_DEPARTMENT_OTHER): Payer: BC Managed Care – PPO | Admitting: Nurse Practitioner

## 2013-08-18 ENCOUNTER — Telehealth: Payer: Self-pay | Admitting: Oncology

## 2013-08-18 VITALS — BP 168/88 | HR 98 | Temp 97.7°F | Resp 19 | Ht 72.0 in | Wt 160.6 lb

## 2013-08-18 DIAGNOSIS — D509 Iron deficiency anemia, unspecified: Secondary | ICD-10-CM

## 2013-08-18 DIAGNOSIS — C184 Malignant neoplasm of transverse colon: Secondary | ICD-10-CM

## 2013-08-18 DIAGNOSIS — Z7901 Long term (current) use of anticoagulants: Secondary | ICD-10-CM

## 2013-08-18 DIAGNOSIS — I743 Embolism and thrombosis of arteries of the lower extremities: Secondary | ICD-10-CM

## 2013-08-18 LAB — COMPREHENSIVE METABOLIC PANEL (CC13)
ALK PHOS: 64 U/L (ref 40–150)
ALT: 10 U/L (ref 0–55)
AST: 12 U/L (ref 5–34)
Albumin: 3.3 g/dL — ABNORMAL LOW (ref 3.5–5.0)
Anion Gap: 9 mEq/L (ref 3–11)
BILIRUBIN TOTAL: 0.34 mg/dL (ref 0.20–1.20)
BUN: 13.8 mg/dL (ref 7.0–26.0)
CALCIUM: 8.9 mg/dL (ref 8.4–10.4)
CHLORIDE: 107 meq/L (ref 98–109)
CO2: 24 mEq/L (ref 22–29)
CREATININE: 1 mg/dL (ref 0.7–1.3)
Glucose: 110 mg/dl (ref 70–140)
Potassium: 3.8 mEq/L (ref 3.5–5.1)
Sodium: 140 mEq/L (ref 136–145)
Total Protein: 6.9 g/dL (ref 6.4–8.3)

## 2013-08-18 LAB — CBC WITH DIFFERENTIAL/PLATELET
BASO%: 1.4 % (ref 0.0–2.0)
Basophils Absolute: 0.2 10*3/uL — ABNORMAL HIGH (ref 0.0–0.1)
EOS%: 2.3 % (ref 0.0–7.0)
Eosinophils Absolute: 0.2 10*3/uL (ref 0.0–0.5)
HEMATOCRIT: 32.7 % — AB (ref 38.4–49.9)
HGB: 10.3 g/dL — ABNORMAL LOW (ref 13.0–17.1)
LYMPH#: 1.7 10*3/uL (ref 0.9–3.3)
LYMPH%: 15.6 % (ref 14.0–49.0)
MCH: 27.2 pg (ref 27.2–33.4)
MCHC: 31.6 g/dL — ABNORMAL LOW (ref 32.0–36.0)
MCV: 86.2 fL (ref 79.3–98.0)
MONO#: 0.8 10*3/uL (ref 0.1–0.9)
MONO%: 7.7 % (ref 0.0–14.0)
NEUT#: 7.9 10*3/uL — ABNORMAL HIGH (ref 1.5–6.5)
NEUT%: 73 % (ref 39.0–75.0)
Platelets: 364 10*3/uL (ref 140–400)
RBC: 3.8 10*6/uL — ABNORMAL LOW (ref 4.20–5.82)
RDW: 35.3 % — ABNORMAL HIGH (ref 11.0–14.6)
WBC: 10.9 10*3/uL — ABNORMAL HIGH (ref 4.0–10.3)

## 2013-08-18 NOTE — Telephone Encounter (Signed)
Faxed signed professional communication - PT admitting on 8/12 and OT to eval as quickly as possible

## 2013-08-18 NOTE — Progress Notes (Signed)
  Cody Suarez OFFICE PROGRESS NOTE   Diagnosis: Colon cancer.   INTERVAL HISTORY:   Cody Suarez returns as scheduled. He completed cycle 4 Xeloda beginning 07/30/2013. He denies nausea/vomiting. No mouth sores. No diarrhea. No hand or foot pain or redness.  On 08/11/2013 he underwent right popliteal to peroneal artery bypass graft, thrombectomy anterior tibialis and attempted thrombectomy tibioperoneal trunk and posterior tibialis. He was discharged on 08/14/2013 on oral anticoagulation with Xarelto.   The leg pain he was experiencing prior to surgery has resolved. Pain at present is related to the incisions.    Objective:  Vital signs in last 24 hours:  Blood pressure 168/88, pulse 98, temperature 97.7 F (36.5 C), temperature source Oral, resp. rate 19, height 6' (1.829 m), weight 160 lb 9.6 oz (72.848 kg).    HEENT: No thrush or ulcers. Resp: Lungs clear bilaterally. Cardio: Regular rate and rhythm. GI: Abdomen soft and nontender. No hepatomegaly. Vascular: Trace edema right lower leg.  Skin: Right medial lower leg incision is without erythema. Palms and soles with mild erythema and skin thickening. No skin breakdown.    Lab Results:  Lab Results  Component Value Date   WBC 10.9* 08/18/2013   HGB 10.3* 08/18/2013   HCT 32.7* 08/18/2013   MCV 86.2 08/18/2013   PLT 364 08/18/2013   NEUTROABS 7.9* 08/18/2013    Imaging:  No results found.  Medications: I have reviewed the patient's current medications.  Assessment/Plan: 1. 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 Tumor invaded through the muscularis propria into pericolonic fatty tissue and involved the attached omentum.  Cycle 1 adjuvant Xeloda 05/28/2013.  Cycle 2 adjuvant Xeloda 06/18/2013.  Cycle 3 adjuvant Xeloda  07/09/2013. Cycle 4 adjuvant Xeloda 07/30/2013. 2. Ulcerative colitis-extensive chronic active ulcerative colitis was noted on the colon resection specimen 03/28/2013. 3. Hypertension.  4. Microcytic anemia-likely iron deficiency, unable to tolerate oral iron. Improved. 5. Possible area of cecal wall thickening noted on abdominal CT 03/20/2013. 6. Family history of colon cancer. 7. Bilateral calf and low anterior leg pain. Bilateral lower extremity venous duplex negative for DVT 07/14/2013. Right ABI with moderate, borderline severe arterial insufficiency; left ABI suggestive of moderate arterial insufficiency. He was referred to vascular. 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 TEE showed thrombus in the descending aorta. He underwent right popliteal to peroneal artery bypass graft, thrombectomy anterior tibialis and attempted thrombectomy tibioperoneal trunk and posterior tibialis on 08/11/2013. Now on anticoagulation with Xarelto. The pain is better.   Disposition: [name] appears to be tolerating the Xeloda well. He has completed 4 cycles. Plan to proceed with cycle 5 as scheduled 08/20/2013. He will return for a followup visit on 09/05/2013. He will contact the office in the interim with any problems.    Ned Card ANP/GNP-BC   08/18/2013  4:11 PM

## 2013-08-18 NOTE — Telephone Encounter (Signed)
Pt confirmed labs/ov per 08/14 POF, gave pt AVS....KJ

## 2013-08-22 ENCOUNTER — Ambulatory Visit: Payer: BC Managed Care – PPO | Admitting: Cardiovascular Disease

## 2013-08-24 ENCOUNTER — Telehealth: Payer: Self-pay | Admitting: *Deleted

## 2013-08-24 NOTE — Telephone Encounter (Signed)
Faxed home health certification and plan of care, addendum to plan of treatment

## 2013-08-28 ENCOUNTER — Encounter: Payer: Self-pay | Admitting: Family

## 2013-08-29 ENCOUNTER — Other Ambulatory Visit: Payer: Self-pay | Admitting: Vascular Surgery

## 2013-08-29 ENCOUNTER — Ambulatory Visit (INDEPENDENT_AMBULATORY_CARE_PROVIDER_SITE_OTHER)
Admission: RE | Admit: 2013-08-29 | Discharge: 2013-08-29 | Disposition: A | Discharge status: Home / Self Care | Payer: BC Managed Care – PPO | Source: Ambulatory Visit | Attending: Vascular Surgery | Admitting: Vascular Surgery

## 2013-08-29 ENCOUNTER — Ambulatory Visit (HOSPITAL_COMMUNITY)
Admission: RE | Admit: 2013-08-29 | Discharge: 2013-08-29 | Disposition: A | Discharge status: Home / Self Care | Payer: BC Managed Care – PPO | Source: Ambulatory Visit | Attending: Family | Admitting: Family

## 2013-08-29 ENCOUNTER — Encounter: Payer: Self-pay | Admitting: Family

## 2013-08-29 ENCOUNTER — Ambulatory Visit (INDEPENDENT_AMBULATORY_CARE_PROVIDER_SITE_OTHER): Payer: Self-pay | Admitting: Family

## 2013-08-29 VITALS — BP 165/81 | HR 73 | Temp 97.6°F | Resp 16 | Ht 70.0 in | Wt 162.0 lb

## 2013-08-29 DIAGNOSIS — Z0279 Encounter for issue of other medical certificate: Secondary | ICD-10-CM

## 2013-08-29 DIAGNOSIS — I739 Peripheral vascular disease, unspecified: Secondary | ICD-10-CM | POA: Diagnosis not present

## 2013-08-29 DIAGNOSIS — Z48812 Encounter for surgical aftercare following surgery on the circulatory system: Secondary | ICD-10-CM

## 2013-08-29 MED ORDER — HYDROCODONE-ACETAMINOPHEN 5-325 MG PO TABS: 1.0000 | ORAL_TABLET | Freq: Two times a day (BID) | ORAL | Status: DC | PRN

## 2013-08-29 NOTE — Addendum Note (Signed)
Addended by: Mena Goes on: 08/29/2013 01:32 PM   Modules accepted: Orders

## 2013-08-29 NOTE — Patient Instructions (Signed)

## 2013-08-29 NOTE — Progress Notes (Signed)
Postoperative Visit   History of Present Illness  Cody Suarez is a 68 y.o. year old male who is s/p right popliteal to peroneal artery bypass graft with non reversed saphenous vein graft, thrombectomy anterior tibialis, attempted thrombectomy tibio-peroneal trunk and posterior tibialis, and intraoperative arteriogram for bilateral chronic ischemia-10 weeks in duration-secondary to diffuse emboli to lower extremities into tibial vessels, on 08/11/2013 by Dr. Kellie Simmering. He presents today for two week follow up.  The patient's wounds are healing well.  The patient notes resolution of lower extremity symptoms.  The patient is able to complete their activities of daily living.  The patient's current symptoms are: he no longer has claudication symptoms in either leg. He has no non healing wounds. Pt denies fever or chills, denies swelling in either leg. He is elevating his legs when not walking and is walking a great deal. He is showering. He was started on Xaralto during his surgical hospitalization and this is managed by his cardiologist.  For VQI Use Only  PRE-ADM LIVING: Home  AMB STATUS: Ambulatory  LOWER EXTREMITY ARTERIAL EVALUATION    INDICATION: Right popliteal to peroneal bypass graft.    PREVIOUS INTERVENTION(S): Right popliteal to peroneal bypass graft 08/09/2013.    DUPLEX EXAM: Duplex evaluation of the lower extremity arterial system to include the common femoral, superficial femoral, popliteal, and tibial arteries and bypass graft(s) and/or stent(s) if present.      FINDINGS:                                       RIGHT LOWER EXTREMITY:      LEFT LOWER EXTREMITY:      ABI: Right: 0.48, Left: 0.61      Physical Examination  Filed Vitals:   08/29/13 1000  BP: 165/81  Pulse: 73  Temp: 97.6 F (36.4 C)  TempSrc: Oral  Resp: 16  Height: 5' 10"  (1.778 m)  Weight: 162 lb (73.483 kg)  SpO2: 100%   Body mass index is 23.24 kg/(m^2).  Right lower leg, medial  aspect: Incisions are healing well with no erythema, no swelling, no drainage, pedal pulses by Doppler were biphasic: bypass pulses, peroneal.  Medical Decision Making  Cody Suarez is a 68 y.o. year old male who presents s/p right popliteal to peroneal artery bypass graft with non reversed saphenous vein graft, thrombectomy anterior tibialis, attempted thrombectomy tibio-peroneal trunk and posterior tibialis, and intraoperative arteriogram for bilateral chronic ischemia-10 weeks in duration-secondary to diffuse emboli to lower extremities into tibial vessels, on 08/11/2013 by Dr. Kellie Simmering.  Dr. Kellie Simmering examined pt, right leg pulses by Doppler were biphasic: bypass pulses, peroneal.  Work note given, may return to work in 4 weeks if no post operative problems. Reordered hydrocodone/APAP 5-325, sig 1-2 tabs twice daily as needed for pain, disp #30, 0 refills. Graduated walking program, elevate legs when not walking. Call our office for signs of incision infection, fever, chills. Keep incision clean and dry, continue to bathe by showering. Continue Xarelto.  The patient's bypass incisions are healing appropriately with resolution of pre-operative symptoms. I discussed in depth with the patient the nature of atherosclerosis, and emphasized the importance of maximal medical management including strict control of blood pressure, blood glucose, and lipid levels, obtaining regular exercise, and cessation of smoking.  The patient is aware that without maximal medical management the underlying atherosclerotic disease process will progress, limiting the  benefit of any interventions. The patient's surveillance will included ABI and bypass duplex studies which will be completed in: 3 months, at which time the patient will be re-evaluated.   I emphasized the importance of routine surveillance of the patient's bypass, as the vascular surgery literature emphasize the improved patency possible with assisted primary  patency procedures versus secondary patency procedures. The patient agrees to participate in their maximal medical care and routine surveillance.  Thank you for allowing [place] to participate in this patient's care.  NICKEL, Sharmon Leyden, RN, MSN, FNP-C [facility] Office: [phone]  08/29/2013, 9:34 AM  Clinic MD: Kellie Simmering

## 2013-09-04 ENCOUNTER — Other Ambulatory Visit: Payer: Self-pay | Admitting: *Deleted

## 2013-09-04 DIAGNOSIS — C184 Malignant neoplasm of transverse colon: Secondary | ICD-10-CM

## 2013-09-04 NOTE — Telephone Encounter (Signed)
THIS REFILL REQUEST FOR CAPECITABINE WAS GIVEN TO DR.SHERRILL'S NURSE, AMY HORTON,RN.

## 2013-09-05 ENCOUNTER — Telehealth: Payer: Self-pay | Admitting: Oncology

## 2013-09-05 ENCOUNTER — Ambulatory Visit (HOSPITAL_BASED_OUTPATIENT_CLINIC_OR_DEPARTMENT_OTHER): Payer: BC Managed Care – PPO | Admitting: Oncology

## 2013-09-05 ENCOUNTER — Other Ambulatory Visit (HOSPITAL_BASED_OUTPATIENT_CLINIC_OR_DEPARTMENT_OTHER): Payer: BC Managed Care – PPO

## 2013-09-05 VITALS — BP 175/76 | HR 110 | Temp 97.9°F | Resp 18 | Ht 70.0 in | Wt 165.6 lb

## 2013-09-05 DIAGNOSIS — Z7901 Long term (current) use of anticoagulants: Secondary | ICD-10-CM

## 2013-09-05 DIAGNOSIS — C184 Malignant neoplasm of transverse colon: Secondary | ICD-10-CM

## 2013-09-05 DIAGNOSIS — D509 Iron deficiency anemia, unspecified: Secondary | ICD-10-CM

## 2013-09-05 DIAGNOSIS — I743 Embolism and thrombosis of arteries of the lower extremities: Secondary | ICD-10-CM

## 2013-09-05 LAB — CBC WITH DIFFERENTIAL/PLATELET
BASO%: 0.5 % (ref 0.0–2.0)
BASOS ABS: 0.1 10*3/uL (ref 0.0–0.1)
EOS%: 1.8 % (ref 0.0–7.0)
Eosinophils Absolute: 0.2 10*3/uL (ref 0.0–0.5)
HEMATOCRIT: 34.4 % — AB (ref 38.4–49.9)
HEMOGLOBIN: 11.1 g/dL — AB (ref 13.0–17.1)
LYMPH%: 11.8 % — ABNORMAL LOW (ref 14.0–49.0)
MCH: 29.4 pg (ref 27.2–33.4)
MCHC: 32.3 g/dL (ref 32.0–36.0)
MCV: 91.2 fL (ref 79.3–98.0)
MONO#: 1.2 10*3/uL — ABNORMAL HIGH (ref 0.1–0.9)
MONO%: 10 % (ref 0.0–14.0)
NEUT#: 9 10*3/uL — ABNORMAL HIGH (ref 1.5–6.5)
NEUT%: 75.9 % — AB (ref 39.0–75.0)
Platelets: 248 10*3/uL (ref 140–400)
RBC: 3.77 10*6/uL — ABNORMAL LOW (ref 4.20–5.82)
RDW: 28.5 % — ABNORMAL HIGH (ref 11.0–14.6)
WBC: 11.9 10*3/uL — ABNORMAL HIGH (ref 4.0–10.3)
lymph#: 1.4 10*3/uL (ref 0.9–3.3)

## 2013-09-05 LAB — COMPREHENSIVE METABOLIC PANEL (CC13)
ALK PHOS: 66 U/L (ref 40–150)
ALT: 16 U/L (ref 0–55)
AST: 15 U/L (ref 5–34)
Albumin: 3.3 g/dL — ABNORMAL LOW (ref 3.5–5.0)
Anion Gap: 9 mEq/L (ref 3–11)
BUN: 11.9 mg/dL (ref 7.0–26.0)
CO2: 24 mEq/L (ref 22–29)
Calcium: 9 mg/dL (ref 8.4–10.4)
Chloride: 108 mEq/L (ref 98–109)
Creatinine: 1 mg/dL (ref 0.7–1.3)
GLUCOSE: 111 mg/dL (ref 70–140)
Potassium: 3.6 mEq/L (ref 3.5–5.1)
Sodium: 141 mEq/L (ref 136–145)
Total Bilirubin: 0.54 mg/dL (ref 0.20–1.20)
Total Protein: 6.4 g/dL (ref 6.4–8.3)

## 2013-09-05 MED ORDER — CAPECITABINE 500 MG PO TABS: 1500.0000 mg | ORAL_TABLET | ORAL | Status: DC

## 2013-09-05 NOTE — Addendum Note (Signed)
Addended by: Wyonia Hough on: 09/05/2013 12:14 PM   Modules accepted: Orders

## 2013-09-05 NOTE — Telephone Encounter (Signed)
, °

## 2013-09-05 NOTE — Progress Notes (Signed)
  [facility] OFFICE PROGRESS NOTE   Diagnosis: Colon cancer  INTERVAL HISTORY:   He completed another cycle of Xeloda beginning 08/20/2013. No mouth sores, diarrhea, or hand/foot pain. He continues xarelto anticoagulation. The right leg surgical wound is healing. He plans to return to work in 2-3 weeks.  Objective:  Vital signs in last 24 hours:  There were no vitals taken for this visit.    HEENT: No thrush or ulcers Resp: Lungs clear bilaterally, no respiratory distress Cardio: Regular rate and rhythm GI: No hepatosplenomegaly, nontender Vascular: No leg edema  Skin: Healing surgical wound at the right lower leg    Lab Results:  Lab Results  Component Value Date   WBC 10.9* 08/18/2013   HGB 10.3* 08/18/2013   HCT 32.7* 08/18/2013   MCV 86.2 08/18/2013   PLT 364 08/18/2013   NEUTROABS 7.9* 08/18/2013    Medications: I have reviewed the patient's current medications.  Assessment/Plan: 1. 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 Negative for a BRAF mutation Tumor invaded through the muscularis propria into pericolonic fatty tissue and involved the attached omentum.  Cycle 1 adjuvant Xeloda 05/28/2013.  Cycle 2 adjuvant Xeloda 06/18/2013.  Cycle 3 adjuvant Xeloda 07/09/2013.  Cycle 4 adjuvant Xeloda 07/30/2013. Cycle 5 adjuvant Xeloda 08/20/2013 2. Ulcerative colitis-extensive chronic active ulcerative colitis was noted on the colon resection specimen 03/28/2013. 3. Hypertension.  4. Microcytic anemia-likely iron deficiency, unable to tolerate oral iron. Improved. 5. Possible area of cecal wall thickening noted on abdominal CT 03/20/2013. 6. Family history of colon cancer. 7. Bilateral calf and low anterior leg pain. Bilateral lower extremity venous duplex negative for DVT  07/14/2013. Right ABI with moderate, borderline severe arterial insufficiency; left ABI suggestive of moderate arterial insufficiency. He was referred to vascular. 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 TEE showed thrombus in the descending aorta. He underwent right popliteal to peroneal artery bypass graft, thrombectomy anterior tibialis and attempted thrombectomy tibioperoneal trunk and posterior tibialis on 08/11/2013. Now on anticoagulation with Xarelto. The pain is better.  Disposition:  He appears well. He has completed 5 cycles of adjuvant Xeloda. The plan is to proceed with cycle 6 on 09/11/2013. He will return for an office visit 09/26/2013.  Betsy Coder, MD  09/05/2013  11:13 AM

## 2013-09-07 ENCOUNTER — Telehealth: Payer: Self-pay | Admitting: *Deleted

## 2013-09-07 ENCOUNTER — Telehealth: Payer: Self-pay | Admitting: Nurse Practitioner

## 2013-09-07 NOTE — Telephone Encounter (Signed)
Call from pt requesting to change 9/18 appt. Has conflicting appt with Dr. Watt Climes that day. Pt given 8/17 at 1415 for lab 1445 with APP. Pt voiced understanding.

## 2013-09-07 NOTE — Telephone Encounter (Signed)
Pt wanted labs/ov moved from 09/18 to 09/17 per 09/03 POF, mailed updated sch...KJ

## 2013-09-08 ENCOUNTER — Telehealth: Payer: Self-pay | Admitting: *Deleted

## 2013-09-08 NOTE — Telephone Encounter (Signed)
Noted order for follow up visit 9/22. Requested schedulers contact pt to reschedule to 9/23 (spots filled for 9/22).

## 2013-09-12 ENCOUNTER — Telehealth: Payer: Self-pay | Admitting: Nurse Practitioner

## 2013-09-12 NOTE — Telephone Encounter (Signed)
Lft msg for pt confirming 09/17 cancelled per 09/04 POF, mailed pt updated sch....Cherylann Banas

## 2013-09-21 ENCOUNTER — Ambulatory Visit: Payer: BC Managed Care – PPO | Admitting: Nurse Practitioner

## 2013-09-21 ENCOUNTER — Other Ambulatory Visit: Payer: BC Managed Care – PPO

## 2013-09-22 ENCOUNTER — Ambulatory Visit: Payer: BC Managed Care – PPO | Admitting: Nurse Practitioner

## 2013-09-22 ENCOUNTER — Other Ambulatory Visit: Payer: BC Managed Care – PPO

## 2013-09-25 ENCOUNTER — Other Ambulatory Visit: Payer: Self-pay | Admitting: *Deleted

## 2013-09-25 DIAGNOSIS — C184 Malignant neoplasm of transverse colon: Secondary | ICD-10-CM

## 2013-09-25 NOTE — Telephone Encounter (Signed)
THIS REFILL REQUEST FOR CAPECITABINE WAS PLACED ON DR.SHERRILL'S DESK.

## 2013-09-26 ENCOUNTER — Telehealth: Payer: Self-pay

## 2013-09-26 ENCOUNTER — Encounter (HOSPITAL_COMMUNITY): Payer: Self-pay | Admitting: Radiology

## 2013-09-26 ENCOUNTER — Ambulatory Visit (INDEPENDENT_AMBULATORY_CARE_PROVIDER_SITE_OTHER)
Admission: RE | Admit: 2013-09-26 | Discharge: 2013-09-26 | Disposition: A | Discharge status: Home / Self Care | Payer: BC Managed Care – PPO | Source: Ambulatory Visit | Attending: Vascular Surgery | Admitting: Vascular Surgery

## 2013-09-26 ENCOUNTER — Encounter: Payer: Self-pay | Admitting: Vascular Surgery

## 2013-09-26 ENCOUNTER — Inpatient Hospital Stay (HOSPITAL_COMMUNITY): Payer: BC Managed Care – PPO

## 2013-09-26 ENCOUNTER — Ambulatory Visit (INDEPENDENT_AMBULATORY_CARE_PROVIDER_SITE_OTHER): Payer: Self-pay | Admitting: Vascular Surgery

## 2013-09-26 ENCOUNTER — Inpatient Hospital Stay (HOSPITAL_COMMUNITY)
Admission: AD | Admit: 2013-09-26 | Discharge: 2013-10-01 | DRG: 315 | Disposition: A | Discharge status: Home / Self Care | Payer: BC Managed Care – PPO | Source: Ambulatory Visit | Attending: Vascular Surgery | Admitting: Vascular Surgery

## 2013-09-26 DIAGNOSIS — M79661 Pain in right lower leg: Secondary | ICD-10-CM

## 2013-09-26 DIAGNOSIS — Z9889 Other specified postprocedural states: Secondary | ICD-10-CM

## 2013-09-26 DIAGNOSIS — Y849 Medical procedure, unspecified as the cause of abnormal reaction of the patient, or of later complication, without mention of misadventure at the time of the procedure: Secondary | ICD-10-CM | POA: Diagnosis present

## 2013-09-26 DIAGNOSIS — I739 Peripheral vascular disease, unspecified: Secondary | ICD-10-CM | POA: Diagnosis present

## 2013-09-26 DIAGNOSIS — D62 Acute posthemorrhagic anemia: Secondary | ICD-10-CM | POA: Diagnosis present

## 2013-09-26 DIAGNOSIS — M79609 Pain in unspecified limb: Secondary | ICD-10-CM | POA: Diagnosis present

## 2013-09-26 DIAGNOSIS — A0472 Enterocolitis due to Clostridium difficile, not specified as recurrent: Secondary | ICD-10-CM | POA: Diagnosis present

## 2013-09-26 DIAGNOSIS — Z95828 Presence of other vascular implants and grafts: Secondary | ICD-10-CM

## 2013-09-26 DIAGNOSIS — D638 Anemia in other chronic diseases classified elsewhere: Secondary | ICD-10-CM | POA: Diagnosis present

## 2013-09-26 DIAGNOSIS — I743 Embolism and thrombosis of arteries of the lower extremities: Secondary | ICD-10-CM | POA: Diagnosis present

## 2013-09-26 DIAGNOSIS — I10 Essential (primary) hypertension: Secondary | ICD-10-CM | POA: Diagnosis present

## 2013-09-26 DIAGNOSIS — T82898A Other specified complication of vascular prosthetic devices, implants and grafts, initial encounter: Secondary | ICD-10-CM | POA: Diagnosis present

## 2013-09-26 DIAGNOSIS — K5190 Ulcerative colitis, unspecified, without complications: Secondary | ICD-10-CM | POA: Diagnosis present

## 2013-09-26 DIAGNOSIS — I749 Embolism and thrombosis of unspecified artery: Secondary | ICD-10-CM

## 2013-09-26 DIAGNOSIS — IMO0002 Reserved for concepts with insufficient information to code with codable children: Secondary | ICD-10-CM | POA: Diagnosis not present

## 2013-09-26 LAB — URINALYSIS, ROUTINE W REFLEX MICROSCOPIC
Bilirubin Urine: NEGATIVE
Glucose, UA: NEGATIVE mg/dL
HGB URINE DIPSTICK: NEGATIVE
Ketones, ur: NEGATIVE mg/dL
Leukocytes, UA: NEGATIVE
Nitrite: NEGATIVE
PH: 5.5 (ref 5.0–8.0)
Protein, ur: NEGATIVE mg/dL
SPECIFIC GRAVITY, URINE: 1.007 (ref 1.005–1.030)
Urobilinogen, UA: 0.2 mg/dL (ref 0.0–1.0)

## 2013-09-26 LAB — COMPREHENSIVE METABOLIC PANEL
ALT: 14 U/L (ref 0–53)
ANION GAP: 13 (ref 5–15)
AST: 18 U/L (ref 0–37)
Albumin: 3.6 g/dL (ref 3.5–5.2)
Alkaline Phosphatase: 67 U/L (ref 39–117)
BILIRUBIN TOTAL: 0.6 mg/dL (ref 0.3–1.2)
BUN: 13 mg/dL (ref 6–23)
CHLORIDE: 105 meq/L (ref 96–112)
CO2: 22 mEq/L (ref 19–32)
Calcium: 8.7 mg/dL (ref 8.4–10.5)
Creatinine, Ser: 0.79 mg/dL (ref 0.50–1.35)
GFR calc Af Amer: >90 mL/min (ref >90)
GFR calc non Af Amer: >90 mL/min (ref >90)
Glucose, Bld: 102 mg/dL — ABNORMAL HIGH (ref 70–99)
POTASSIUM: 3.9 meq/L (ref 3.7–5.3)
Sodium: 140 mEq/L (ref 137–147)
Total Protein: 6.5 g/dL (ref 6.0–8.3)

## 2013-09-26 LAB — CBC
HEMATOCRIT: 34.5 % — AB (ref 39.0–52.0)
Hemoglobin: 11.3 g/dL — ABNORMAL LOW (ref 13.0–17.0)
MCH: 31.9 pg (ref 26.0–34.0)
MCHC: 32.8 g/dL (ref 30.0–36.0)
MCV: 97.5 fL (ref 78.0–100.0)
Platelets: 241 10*3/uL (ref 150–400)
RBC: 3.54 MIL/uL — ABNORMAL LOW (ref 4.22–5.81)
RDW: 28 % — AB (ref 11.5–15.5)
WBC: 9.4 10*3/uL (ref 4.0–10.5)

## 2013-09-26 LAB — PROTIME-INR
INR: 1.28 (ref 0.00–1.49)
Prothrombin Time: 16 seconds — ABNORMAL HIGH (ref 11.6–15.2)

## 2013-09-26 LAB — HEPARIN LEVEL (UNFRACTIONATED): Heparin Unfractionated: 0.18 IU/mL — ABNORMAL LOW (ref 0.30–0.70)

## 2013-09-26 LAB — FIBRINOGEN: Fibrinogen: 297 mg/dL (ref 204–475)

## 2013-09-26 MED ORDER — DOCUSATE SODIUM 100 MG PO CAPS
100.0000 mg | ORAL_CAPSULE | Freq: Two times a day (BID) | ORAL | Status: DC
  Filled 2013-09-26 (×4): qty 1

## 2013-09-26 MED ORDER — METOPROLOL TARTRATE 1 MG/ML IV SOLN: 2.0000 mg | INTRAVENOUS | Status: DC | PRN

## 2013-09-26 MED ORDER — SODIUM CHLORIDE 0.9 % IJ SOLN
3.0000 mL | Freq: Two times a day (BID) | INTRAMUSCULAR | Status: DC
  Administered 2013-09-26 – 2013-09-30 (×9): 3 mL via INTRAVENOUS

## 2013-09-26 MED ORDER — ACETAMINOPHEN 325 MG PO TABS
325.0000 mg | ORAL_TABLET | ORAL | Status: DC | PRN
Start: 2013-09-26 — End: 2013-10-01

## 2013-09-26 MED ORDER — HYDROCODONE-ACETAMINOPHEN 5-325 MG PO TABS: 1.0000 | ORAL_TABLET | Freq: Two times a day (BID) | ORAL | Status: DC | PRN

## 2013-09-26 MED ORDER — SODIUM CHLORIDE 0.9 % IJ SOLN: 3.0000 mL | INTRAMUSCULAR | Status: DC | PRN

## 2013-09-26 MED ORDER — ALUM & MAG HYDROXIDE-SIMETH 200-200-20 MG/5ML PO SUSP: 15.0000 mL | ORAL | Status: DC | PRN

## 2013-09-26 MED ORDER — LABETALOL HCL 5 MG/ML IV SOLN
10.0000 mg | INTRAVENOUS | Status: DC | PRN
  Administered 2013-09-27: 10 mg via INTRAVENOUS
  Filled 2013-09-26 (×2): qty 4

## 2013-09-26 MED ORDER — IODIXANOL 320 MG/ML IV SOLN
100.0000 mL | Freq: Once | INTRAVENOUS | Status: AC | PRN
  Administered 2013-09-26: 50 mL via INTRAVENOUS

## 2013-09-26 MED ORDER — BISACODYL 10 MG RE SUPP: 10.0000 mg | Freq: Every day | RECTAL | Status: DC | PRN

## 2013-09-26 MED ORDER — LIDOCAINE HCL 1 % IJ SOLN
INTRAMUSCULAR | Status: AC
  Filled 2013-09-26: qty 20

## 2013-09-26 MED ORDER — HYDRALAZINE HCL 20 MG/ML IJ SOLN
10.0000 mg | INTRAMUSCULAR | Status: DC | PRN
  Administered 2013-09-27: 10 mg via INTRAVENOUS
  Filled 2013-09-26: qty 1

## 2013-09-26 MED ORDER — OXYCODONE HCL 5 MG PO TABS
5.0000 mg | ORAL_TABLET | ORAL | Status: DC | PRN
  Administered 2013-09-26: 10 mg via ORAL
  Filled 2013-09-26: qty 2

## 2013-09-26 MED ORDER — ACETAMINOPHEN 650 MG RE SUPP
325.0000 mg | RECTAL | Status: DC | PRN
Start: 2013-09-26 — End: 2013-10-01

## 2013-09-26 MED ORDER — POTASSIUM CHLORIDE CRYS ER 20 MEQ PO TBCR
20.0000 meq | EXTENDED_RELEASE_TABLET | Freq: Every day | ORAL | Status: DC
  Filled 2013-09-26 (×5): qty 1

## 2013-09-26 MED ORDER — TENECTEPLASE 50 MG IV KIT
0.2500 mg/h | PACK | INTRAVENOUS | Status: DC
  Administered 2013-09-26 – 2013-09-27 (×3): 0.5 mg/h
  Administered 2013-09-27 – 2013-09-28 (×2): 0.25 mg/h
  Filled 2013-09-26 (×6): qty 0.5

## 2013-09-26 MED ORDER — FENTANYL CITRATE 0.05 MG/ML IJ SOLN
INTRAMUSCULAR | Status: AC | PRN
  Administered 2013-09-26: 25 ug via INTRAVENOUS

## 2013-09-26 MED ORDER — HYDROCODONE-ACETAMINOPHEN 5-325 MG PO TABS: 1.0000 | ORAL_TABLET | Freq: Every day | ORAL | Status: DC | PRN

## 2013-09-26 MED ORDER — SODIUM CHLORIDE 0.9 % IV SOLN
INTRAVENOUS | Status: DC
  Administered 2013-09-26 – 2013-09-30 (×6): via INTRAVENOUS

## 2013-09-26 MED ORDER — CAPECITABINE 500 MG PO TABS: 1500.0000 mg | ORAL_TABLET | Freq: Every day | ORAL | Status: DC

## 2013-09-26 MED ORDER — MIDAZOLAM HCL 2 MG/2ML IJ SOLN
INTRAMUSCULAR | Status: AC
  Filled 2013-09-26: qty 4

## 2013-09-26 MED ORDER — MORPHINE SULFATE 2 MG/ML IJ SOLN
2.0000 mg | INTRAMUSCULAR | Status: DC | PRN
  Administered 2013-09-26: 4 mg via INTRAVENOUS
  Administered 2013-09-26 (×2): 2 mg via INTRAVENOUS
  Administered 2013-09-27 (×2): 4 mg via INTRAVENOUS
  Filled 2013-09-26: qty 1
  Filled 2013-09-26 (×3): qty 2
  Filled 2013-09-26: qty 1

## 2013-09-26 MED ORDER — DEXTROSE 5 % IV SOLN
1.5000 g | Freq: Two times a day (BID) | INTRAVENOUS | Status: DC
  Administered 2013-09-26 – 2013-09-30 (×8): 1.5 g via INTRAVENOUS
  Filled 2013-09-26 (×9): qty 1.5

## 2013-09-26 MED ORDER — HEPARIN (PORCINE) IN NACL 100-0.45 UNIT/ML-% IJ SOLN
1000.0000 [IU]/h | INTRAMUSCULAR | Status: DC
  Administered 2013-09-26 – 2013-09-28 (×2): 800 [IU]/h via INTRAVENOUS
  Filled 2013-09-26 (×5): qty 250

## 2013-09-26 MED ORDER — SODIUM CHLORIDE 0.9 % IV SOLN
250.0000 mL | INTRAVENOUS | Status: DC | PRN
  Administered 2013-09-26: 250 mL via INTRAVENOUS

## 2013-09-26 MED ORDER — CAPECITABINE 500 MG PO TABS: 1500.0000 mg | ORAL_TABLET | ORAL | Status: DC

## 2013-09-26 MED ORDER — ONDANSETRON HCL 4 MG/2ML IJ SOLN
4.0000 mg | Freq: Four times a day (QID) | INTRAMUSCULAR | Status: DC | PRN
  Administered 2013-09-26 – 2013-09-28 (×2): 4 mg via INTRAVENOUS
  Filled 2013-09-26: qty 2

## 2013-09-26 MED ORDER — FENTANYL CITRATE 0.05 MG/ML IJ SOLN
INTRAMUSCULAR | Status: AC
  Filled 2013-09-26: qty 4

## 2013-09-26 MED ORDER — CAPECITABINE 500 MG PO TABS: 2000.0000 mg | ORAL_TABLET | Freq: Every day | ORAL | Status: DC

## 2013-09-26 MED ORDER — BUDESONIDE 9 MG PO TB24
9.0000 mg | ORAL_TABLET | Freq: Every day | ORAL | Status: DC
  Administered 2013-09-29 – 2013-09-30 (×2): 9 mg via ORAL
  Filled 2013-09-26 (×5): qty 1

## 2013-09-26 MED ORDER — MIDAZOLAM HCL 2 MG/2ML IJ SOLN
INTRAMUSCULAR | Status: AC | PRN
  Administered 2013-09-26: 1 mg via INTRAVENOUS

## 2013-09-26 MED ORDER — GUAIFENESIN-DM 100-10 MG/5ML PO SYRP: 15.0000 mL | ORAL_SOLUTION | ORAL | Status: DC | PRN

## 2013-09-26 MED ORDER — PHENOL 1.4 % MT LIQD: 1.0000 | OROMUCOSAL | Status: DC | PRN

## 2013-09-26 MED ORDER — PANTOPRAZOLE SODIUM 40 MG PO TBEC
40.0000 mg | DELAYED_RELEASE_TABLET | Freq: Every day | ORAL | Status: DC
  Administered 2013-09-27 – 2013-09-30 (×3): 40 mg via ORAL
  Filled 2013-09-26 (×3): qty 1

## 2013-09-26 NOTE — Sedation Documentation (Signed)
O2 2l/[place] started

## 2013-09-26 NOTE — Progress Notes (Signed)
Subjective:     Patient ID: Cody Suarez, male   DOB: 09/30/1945, 68 y.o.   MRN: [id number]  HPI this 68 year old male underwent right popliteal to peroneal bypass using saphenous vein by me 08/11/2013. Patient had presented with history of multiple emboli to both legs having occurred 8 weeks earlier. His bypass was functioning well and he had no claudication in either lower extremity until today at work when he was on a ladder and developed severe right calf discomfort. This has slightly improved and he has had some tingling in the right foot. He denies rest pain in the right foot. He came to the office where ultrasound revealed occlusion of the right to peroneal bypass and he will be admitted for treatment. He took [place] yesterday morning but did not take a dose today.  Past Medical History  Diagnosis Date  . Diarrhea   . Hypertension   . Ulcerative colitis     history  . Anemia of chronic disease 2015    "related to cancer tx" (08/09/2013)  . Arthritis     "thumb joints" (08/09/2013)  . Kidney stones X 2    "passed them both"  . Colon cancer 03/2013    History  Substance Use Topics  . Smoking status: Never Smoker   . Smokeless tobacco: Never Used  . Alcohol Use: 1.8 oz/week    3 Glasses of wine per week    Family History  Problem Relation Age of Onset  . Heart disease Mother     No Known Allergies  Current outpatient prescriptions:acetaminophen (TYLENOL) 500 MG tablet, Take 1,000 mg by mouth 3 (three) times daily as needed (pain)., Disp: , Rfl: ;  Budesonide (UCERIS) 9 MG TB24, Take 9 mg by mouth daily. , Disp: , Rfl:  capecitabine (XELODA) 500 MG tablet, Take 3-4 tablets (1,500-2,000 mg total) by mouth See admin instructions. Take 4 tablets (2000 mg) every morning and 3 tablets (1500 mg) every night for 14 days, hold for 7 days, then repeat, Disp: 98 tablet, Rfl: 0;  Cyanocobalamin (VITAMIN B-12 SL), Place 1 tablet under the tongue daily., Disp: , Rfl:   HYDROcodone-acetaminophen (NORCO/VICODIN) 5-325 MG per tablet, Take 1 tablet by mouth 2 (two) times daily as needed for moderate pain., Disp: 30 tablet, Rfl: 0;  potassium chloride SA (K-DUR,KLOR-CON) 20 MEQ tablet, Take 1 tablet (20 mEq total) by mouth daily., Disp: 30 tablet, Rfl: 3;  prochlorperazine (COMPAZINE) 10 MG tablet, , Disp: , Rfl:  rivaroxaban (XARELTO) 20 MG TABS tablet, Take 1 tablet (20 mg total) by mouth daily with breakfast., Disp: 30 tablet, Rfl: 0;  sildenafil (VIAGRA) 50 MG tablet, Take 50 mg by mouth daily as needed for erectile dysfunction., Disp: , Rfl:   There were no vitals taken for this visit.  There is no weight on file to calculate BMI.        \   Review of Systems denies chest pain, dyspnea on exertion, PND, orthopnea, hemoptysis, lateralizing weakness, aphasia,. All other systems negative and cleared of systems     Objective:   Physical Exam There were no vitals taken for this visit.  Gen.-alert and oriented x3 in no apparent distress HEENT normal for age Lungs no rhonchi or wheezing Cardiovascular regular rhythm no murmurs carotid pulses 3+ palpable no bruits audible Abdomen soft nontender no palpable masses Musculoskeletal free of  major deformities Skin clear -no rashes Neurologic normal Lower extremities 3+ femoral pulses palpable bilaterally. Right leg with slight decreased capillary refill and  some mild tingling in foot. No pain. Minimal calf discomfort to palpation on the right. Left leg asymptomatic  Duplex scan revealed no flow in the right popliteal to peroneal bypass and sluggish flow in right posterior tibial artery with ABI equals 0.2        Assessment:     Status post right popliteal to peroneal bypass 08/11/2013 following multiple emboli to both lower extremities tibial vessels 8 weeks prior to that Acute occlusion of the above bypass today Patient on chronic anti-coagulation which has not been taken since yesterday  morning-Xeralto    Plan:     Plan admit patient to hospital urgently today and have consulted interventional radiology to perform angiogram right leg and possible thrombolysis through catheter in right bypass if feasible

## 2013-09-26 NOTE — Progress Notes (Signed)
VASCULAR SURGERY:  This patient is undergoing thrombolysis for an occluded right popliteal to peroneal artery bypass graft done on 08/11/2013. He developed some acute worsening of his symptoms tonight. This is likely related to embolization secondary to thrombolysis. The plan will be to continue his heparin and TNK infusion. To help dissolve the distal clot. He is scheduled for a follow up study in the morning.  Deitra Mayo, MD, FACS Beeper [phone] 09/26/2013

## 2013-09-26 NOTE — Consult Note (Signed)
Referring Physician(s): Dr. Tinnie Gens  Subjective: 67 year old male underwent right popliteal to peroneal bypass using saphenous vein by me 08/11/2013. Patient had presented with history of multiple emboli to both legs having occurred 8 weeks earlier. His bypass was functioning well and he had no claudication in either lower extremity until today at work when he was on a ladder and developed severe right calf discomfort. Went to [facility] and found to have acutely thrombosed bypass. Has been admitted for angiogram and hopefully catheter directed thrombolysis. Pt currently reports pain has eased some. Pt on Xarelto since surgery, last dose yesterday.  Past Medical History  Diagnosis Date  . Diarrhea   . Hypertension   . Ulcerative colitis     history  . Anemia of chronic disease 2015    "related to cancer tx" (08/09/2013)  . Arthritis     "thumb joints" (08/09/2013)  . Kidney stones X 2    "passed them both"  . Colon cancer 03/2013   Past Surgical History  Procedure Laterality Date  . Vasectomy    . Laparoscopic right hemi colectomy N/A 03/28/2013    Procedure: LAPAROSCOPIC ASSISTED HEMI COLECTOMY;  Surgeon: Pedro Earls, MD;  Location: [facility];  Service: General;  Laterality: N/A;  . Colon surgery    . Lower extremity angiogram Bilateral 08/09/2013  . Tonsillectomy  ~ 1950  . Hernia repair  ~ 1995; 03/2013    UHR  . Umbilical hernia repair  1995; 03/2013  . Tee without cardioversion N/A 08/10/2013    Procedure: TRANSESOPHAGEAL ECHOCARDIOGRAM (TEE);  Surgeon: Candee Furbish, MD;  Location: [facility] ENDOSCOPY;  Service: Cardiovascular;  Laterality: N/A;  . Femoral-popliteal bypass graft Right 08/11/2013    Procedure:   RIGHT - POPLITEAL TO PERONEAL ARTERY BYPASS GRAFT  WITH NONREVERSED SAPHENOUS VEIN GRAFT,tHROMBECTOMY ANTERIOR TIBIALIS,ATTEMPTED THROMBECTOMY TIBIO-PERONEAL TRUNK AND POSTERIOR TIBIALIS, INTRAOPERATIVE ARTERIOGRAM.;  Surgeon: Mal Misty, MD;  Location: [facility];  Service: Vascular;   Laterality: Right;   History   Social History  . Marital Status: Married    Spouse Name: N/A    Number of Children: N/A  . Years of Education: N/A   Occupational History  . Not on file.   Social History Main Topics  . Smoking status: Never Smoker   . Smokeless tobacco: Never Used  . Alcohol Use: 1.8 oz/week    3 Glasses of wine per week  . Drug Use: No  . Sexual Activity: Yes   Other Topics Concern  . Not on file   Social History Narrative  . No narrative on file    Allergies: Review of patient's allergies indicates no known allergies.  Medications: Prior to Admission medications   Medication Sig Start Date End Date Taking? Authorizing Provider  acetaminophen (TYLENOL) 500 MG tablet Take 1,000 mg by mouth 3 (three) times daily as needed (pain).    Historical Provider, MD  Budesonide (UCERIS) 9 MG TB24 Take 9 mg by mouth daily.     Historical Provider, MD  capecitabine (XELODA) 500 MG tablet Take 3-4 tablets (1,500-2,000 mg total) by mouth See admin instructions. Take 4 tablets (2000 mg) every morning and 3 tablets (1500 mg) every night for 14 days, hold for 7 days, then repeat 09/26/13   [name], MD  Cyanocobalamin (VITAMIN B-12 SL) Place 1 tablet under the tongue daily.    Historical Provider, MD  HYDROcodone-acetaminophen (NORCO/VICODIN) 5-325 MG per tablet Take 1 tablet by mouth 2 (two) times daily as needed for moderate pain.  08/29/13   Sharmon Leyden Nickel, NP  potassium chloride SA (K-DUR,KLOR-CON) 20 MEQ tablet Take 1 tablet (20 mEq total) by mouth daily. 07/07/13   [name], MD  prochlorperazine (COMPAZINE) 10 MG tablet  05/19/13   Historical Provider, MD  rivaroxaban (XARELTO) 20 MG TABS tablet Take 1 tablet (20 mg total) by mouth daily with breakfast. 08/14/13   Evelene Croon Barrett, PA-C  sildenafil (VIAGRA) 50 MG tablet Take 50 mg by mouth daily as needed for erectile dysfunction.    Historical Provider, MD    Review of Systems  Constitutional: Negative.     HENT: Negative.   Respiratory: Negative.   Cardiovascular: Negative.   Gastrointestinal: Negative.   Musculoskeletal:       Right calf pain    Vital Signs: None taken yet  Physical Exam  Constitutional: He is oriented to person, place, and time. He appears well-developed and well-nourished.  HENT:  Head: Normocephalic.  Neck: Normal range of motion.  Cardiovascular: Normal rate and regular rhythm.   Palpable 1+ femoral pulses No palp pedal pulses on (R) (R)foot cool, no mottling Sensation and motor intact.  Pulmonary/Chest: Effort normal and breath sounds normal.  Neurological: He is alert and oriented to person, place, and time. No cranial nerve deficit.  Psychiatric: He has a normal mood and affect. Thought content normal.    Imaging: No results found.  Labs: No results found for this or any previous visit (from the past 48 hour(s)).  Assessment and Plan: Acutely occluded/thrombosed (R)LE arterial bypass For (R)LE arteriogram and possible initiation of thrombolysis therapy Explained procedure, risks, complications, use of sedation, and need for follow up procedure tomorrow.  Ascencion Dike PA-C Interventional Radiology 09/26/2013 2:54 PM

## 2013-09-26 NOTE — Progress Notes (Addendum)
ANTICOAGULATION CONSULT NOTE - Initial Consult  Pharmacy Consult for heparin Indication: DVT  No Known Allergies  Patient Measurements:  Weight: 75.1 kg 09/05/13  Vital Signs: Temp: 97.6 F (36.4 C) (09/22 1600) Temp src: Oral (09/22 1600) BP: 169/95 mmHg (09/22 1915) Pulse Rate: 69 (09/22 1915)  Labs:  Recent Labs  09/26/13 1430  HGB 11.3*  HCT 34.5*  PLT 241  LABPROT 16.0*  INR 1.28  CREATININE 0.79    The CrCl is unknown because both a height and weight (above a minimum accepted value) are required for this calculation.   Medical History: Past Medical History  Diagnosis Date  . Diarrhea   . Hypertension   . Ulcerative colitis     history  . Anemia of chronic disease 2015    "related to cancer tx" (08/09/2013)  . Arthritis     "thumb joints" (08/09/2013)  . Kidney stones X 2    "passed them both"  . Colon cancer 03/2013    Medications:  Prescriptions prior to admission  Medication Sig Dispense Refill  . acetaminophen (TYLENOL) 500 MG tablet Take 1,000 mg by mouth 3 (three) times daily as needed (pain).      . Budesonide (UCERIS) 9 MG TB24 Take 9 mg by mouth daily.       . capecitabine (XELODA) 500 MG tablet Take 1,500-2,000 mg by mouth See admin instructions. Take 4 tablets (2000 mg) every morning and 3 tablets (1500 mg) every night for 14 days, hold for 7 days, then repeat      . Cyanocobalamin (VITAMIN B-12 SL) Place 1 tablet under the tongue 3 (three) times a week.       Marland Kitchen HYDROcodone-acetaminophen (NORCO/VICODIN) 5-325 MG per tablet Take 1 tablet by mouth daily as needed for moderate pain.      . hydrocortisone cream 1 % Apply 1 application topically 2 (two) times daily as needed for itching.      . rivaroxaban (XARELTO) 20 MG TABS tablet Take 1 tablet (20 mg total) by mouth daily with breakfast.  30 tablet  0  . sildenafil (VIAGRA) 50 MG tablet Take 50 mg by mouth daily as needed for erectile dysfunction.      . [DISCONTINUED] HYDROcodone-acetaminophen  (NORCO/VICODIN) 5-325 MG per tablet Take 1 tablet by mouth 2 (two) times daily as needed for moderate pain.  30 tablet  0    Assessment: Pharmacy consulted to dose heparin in 68 yo pt s/p successful LLE ANGIO with infusion cath access into vein graft below knee.  Heparin at 800 units/hr and TNKase at 0.5 mg/kg started in IR at 1900.  To have f/u angio tomorrow.  HL and fibrinogen levels ordered q6h per the vascular procedure order set.  Wt 75.1 kg, Hg 11.3, pltc 241.   Goal of Therapy:  Heparin level 0.2 - 0.5 units/ml Monitor platelets by anticoagulation protocol: Yes   Plan:  -continue heparin at 800 units/hr and check HL at midnight with q6h HL/fibrinogen labs -TNKase running at 0.5 mg/hr CBC ordered x 1 for 9/23 am  Eudelia Bunch, Pharm.D. [phone] 09/26/2013 7:47 PM

## 2013-09-26 NOTE — Procedures (Addendum)
Correction:  RLE angio with infusion cath access into vein graft below the knee

## 2013-09-26 NOTE — Addendum Note (Signed)
Addended by: Wyonia Hough on: 09/26/2013 02:45 PM   Modules accepted: Orders

## 2013-09-26 NOTE — Telephone Encounter (Addendum)
Pt. Called and reported sudden onset of severe pain in right calf this morning while on ladder at work.  Stated the pain was so sudden and severe that he lost control of his bowels.  Described like feeling of being hit with hammer in his calf.  Denied any swelling of right foot/ lower extremity.  Denied any redness or inflammation.  Denied pain or numbness in right foot.  Stated the pain in right calf is constant, and feels like a thumb pushing in the center of the calf.  Discussed with Dr. Kellie Simmering.  Advised to bring pt. As soon as possible for ABI's and Right LE Art. Duplex of BPG.  Notified pt.  Appt. given for 11:30AM today.  Advised to have someone drive him to office.  Verb. Understanding.

## 2013-09-26 NOTE — Progress Notes (Signed)
Pt with sudden 10/10 pain in right leg @ 2120.  Pt now with large purplish pink discoloration to inside of right knee.  Popliteal pulse still dopplerable.  Right foot still cold and blue, pulses stil absent.  Dr. Scot Dock notified, came to bedside.  MD recommended paging IR with patient assessment.  Notified Dr. Annamaria Boots, stated this is expected, no orders at this time.  Color at right knee now beginning to return to normal.  Pain only slightly better after medication.  Will continue to monitor.  Vista Lawman, RN

## 2013-09-26 NOTE — Sedation Documentation (Signed)
Time out performed at [facility]

## 2013-09-27 ENCOUNTER — Ambulatory Visit: Payer: BC Managed Care – PPO | Admitting: Nurse Practitioner

## 2013-09-27 ENCOUNTER — Inpatient Hospital Stay (HOSPITAL_COMMUNITY): Payer: BC Managed Care – PPO

## 2013-09-27 ENCOUNTER — Telehealth: Payer: Self-pay | Admitting: Nurse Practitioner

## 2013-09-27 ENCOUNTER — Other Ambulatory Visit: Payer: BC Managed Care – PPO

## 2013-09-27 LAB — CBC
HCT: 34.7 % — ABNORMAL LOW (ref 39.0–52.0)
HEMOGLOBIN: 11.8 g/dL — AB (ref 13.0–17.0)
MCH: 31.8 pg (ref 26.0–34.0)
MCHC: 34 g/dL (ref 30.0–36.0)
MCV: 93.5 fL (ref 78.0–100.0)
Platelets: 183 10*3/uL (ref 150–400)
RBC: 3.71 MIL/uL — ABNORMAL LOW (ref 4.22–5.81)
RDW: 27.7 % — AB (ref 11.5–15.5)
WBC: 13.8 10*3/uL — ABNORMAL HIGH (ref 4.0–10.5)

## 2013-09-27 LAB — HEPARIN LEVEL (UNFRACTIONATED)
HEPARIN UNFRACTIONATED: 0.21 [IU]/mL — AB (ref 0.30–0.70)
Heparin Unfractionated: 0.29 IU/mL — ABNORMAL LOW (ref 0.30–0.70)

## 2013-09-27 LAB — FIBRINOGEN
FIBRINOGEN: 123 mg/dL — AB (ref 204–475)
Fibrinogen: 117 mg/dL — ABNORMAL LOW (ref 204–475)
Fibrinogen: 155 mg/dL — ABNORMAL LOW (ref 204–475)
Fibrinogen: 192 mg/dL — ABNORMAL LOW (ref 204–475)

## 2013-09-27 MED ORDER — NALOXONE HCL 0.4 MG/ML IJ SOLN: 0.4000 mg | INTRAMUSCULAR | Status: DC | PRN

## 2013-09-27 MED ORDER — DIPHENHYDRAMINE HCL 12.5 MG/5ML PO ELIX
12.5000 mg | ORAL_SOLUTION | Freq: Four times a day (QID) | ORAL | Status: DC | PRN
  Filled 2013-09-27: qty 5

## 2013-09-27 MED ORDER — IOHEXOL 300 MG/ML  SOLN
100.0000 mL | Freq: Once | INTRAMUSCULAR | Status: AC | PRN
  Administered 2013-09-27: 40 mL via INTRA_ARTERIAL

## 2013-09-27 MED ORDER — LIDOCAINE HCL 1 % IJ SOLN
INTRAMUSCULAR | Status: AC
  Filled 2013-09-27: qty 20

## 2013-09-27 MED ORDER — ONDANSETRON HCL 4 MG/2ML IJ SOLN: 4.0000 mg | Freq: Four times a day (QID) | INTRAMUSCULAR | Status: DC | PRN

## 2013-09-27 MED ORDER — MORPHINE SULFATE (PF) 1 MG/ML IV SOLN
INTRAVENOUS | Status: DC
  Administered 2013-09-27: 10.5 mg via INTRAVENOUS
  Administered 2013-09-27 (×2): via INTRAVENOUS
  Administered 2013-09-27: 9 mg via INTRAVENOUS
  Administered 2013-09-27: 7.5 mg via INTRAVENOUS
  Administered 2013-09-28: 1.5 mg via INTRAVENOUS
  Administered 2013-09-28: 7.5 mg via INTRAVENOUS
  Filled 2013-09-27 (×2): qty 25

## 2013-09-27 MED ORDER — DIPHENHYDRAMINE HCL 50 MG/ML IJ SOLN: 12.5000 mg | Freq: Four times a day (QID) | INTRAMUSCULAR | Status: DC | PRN

## 2013-09-27 MED ORDER — SODIUM CHLORIDE 0.9 % IJ SOLN: 9.0000 mL | INTRAMUSCULAR | Status: DC | PRN

## 2013-09-27 NOTE — Procedures (Signed)
Follow-up right lower arteriogram demonstrates persistent occlusion at of the pop-peroneal bypass graft.  Discussed with Dr. Kellie Simmering and will continue thrombolytic therapy.

## 2013-09-27 NOTE — Progress Notes (Signed)
ANTICOAGULATION CONSULT NOTE - Follow Up Consult  Pharmacy Consult for heparin Indication: DVT  No Known Allergies  Patient Measurements:  Weight: 75.1 kg 09/05/13  Vital Signs: Temp: 97.6 F (36.4 C) (09/23 0000) Temp src: Oral (09/23 0000) BP: 174/74 mmHg (09/23 0000) Pulse Rate: 86 (09/23 0000)  Labs:  Recent Labs  09/26/13 1430 09/26/13 2120 09/27/13 0005  HGB 11.3*  --   --   HCT 34.5*  --   --   PLT 241  --   --   LABPROT 16.0*  --   --   INR 1.28  --   --   HEPARINUNFRC  --  0.18* 0.29*  CREATININE 0.79  --   --     Estimated Creatinine Clearance: 91.5 ml/min (by C-G formula based on Cr of 0.79).   Medical History: Past Medical History  Diagnosis Date  . Diarrhea   . Hypertension   . Ulcerative colitis     history  . Anemia of chronic disease 2015    "related to cancer tx" (08/09/2013)  . Arthritis     "thumb joints" (08/09/2013)  . Kidney stones X 2    "passed them both"  . Colon cancer 03/2013    Medications:  Prescriptions prior to admission  Medication Sig Dispense Refill  . acetaminophen (TYLENOL) 500 MG tablet Take 1,000 mg by mouth 3 (three) times daily as needed (pain).      . Budesonide (UCERIS) 9 MG TB24 Take 9 mg by mouth daily.       . capecitabine (XELODA) 500 MG tablet Take 1,500-2,000 mg by mouth See admin instructions. Take 4 tablets (2000 mg) every morning and 3 tablets (1500 mg) every night for 14 days, hold for 7 days, then repeat      . Cyanocobalamin (VITAMIN B-12 SL) Place 1 tablet under the tongue 3 (three) times a week.       Marland Kitchen HYDROcodone-acetaminophen (NORCO/VICODIN) 5-325 MG per tablet Take 1 tablet by mouth daily as needed for moderate pain.      . hydrocortisone cream 1 % Apply 1 application topically 2 (two) times daily as needed for itching.      . rivaroxaban (XARELTO) 20 MG TABS tablet Take 1 tablet (20 mg total) by mouth daily with breakfast.  30 tablet  0  . sildenafil (VIAGRA) 50 MG tablet Take 50 mg by mouth daily as  needed for erectile dysfunction.      . [DISCONTINUED] HYDROcodone-acetaminophen (NORCO/VICODIN) 5-325 MG per tablet Take 1 tablet by mouth 2 (two) times daily as needed for moderate pain.  30 tablet  0    Assessment: Pharmacy consulted to dose heparin in 68 yo pt s/p successful LLE ANGIO with infusion cath access into vein graft below knee.  Heparin at 800 units/hr and TNKase at 0.5 mg/kg started in IR at 1900 on 9/22.  To have f/u angio on 9/23.  HL and fibrinogen levels ordered q6h per the vascular procedure order set. 6-hr HL at midnight was therapeutic at 0.29. No bleeding noted.  Wt 75.1 kg, Hg 11.3, pltc 241.   Goal of Therapy:  Heparin level 0.2 - 0.5 units/ml Monitor platelets by anticoagulation protocol: Yes   Plan:  -continue heparin at 800 units/hr and re-check HL q6h HL/fibrinogen labs -TNKase running at 0.5 mg/hr CBC ordered x 1 for 9/23 am  Albertina Parr, PharmD.  Clinical Pharmacist Pager [phone]

## 2013-09-27 NOTE — Progress Notes (Signed)
Pt with continued 10/10 pain in right leg after several doses of morphine and a dose of oxycodone.  Dr. Scot Dock notified. Orders received for morphine PCA.  Will continue to monitor.  Vista Lawman, RN

## 2013-09-27 NOTE — Sedation Documentation (Signed)
TNK resumed at previous dose .71m/hr

## 2013-09-27 NOTE — Sedation Documentation (Signed)
Dr Anselm Pancoast discussed case with Dr Kellie Simmering via phone, will continue TNK overnight.

## 2013-09-27 NOTE — Sedation Documentation (Signed)
Initial injection for assessment

## 2013-09-27 NOTE — Progress Notes (Signed)
Patient ID: Cody Suarez, male   DOB: [birth year], 68 y.o.   MRN: [id number] Vascular Surgery Progress Note  Subjective: Status post thrombolysis of right popliteal to peroneal bypass. Patient presented in office yesterday with acute occlusion of right popliteal to peroneal bypass performed on the August 7. I are performed angiography in thrombolysis was performed during the night. Patient initially developed severe pain in the right leg for 4-5 hours which then resolved. The patient now states right foot feels normal with no numbness and has mild discomfort in right posterior calf.  Objective:  Filed Vitals:   09/27/13 0800  BP:   Pulse:   Temp:   Resp: 13    General alert and oriented x3 Thrombolysis catheter in place in right inguinal area with 2+ femoral pulse palpable. Brisk right posterior tibial flow an audible right peroneal flow with pink right foot with good capillary refill. Good sensation right foot and good motion.   Labs:  Recent Labs Lab 09/26/13 1430  CREATININE 0.79    Recent Labs Lab 09/26/13 1430  NA 140  K 3.9  CL 105  CO2 22  BUN 13  CREATININE 0.79  GLUCOSE 102*  CALCIUM 8.7    Recent Labs Lab 09/26/13 1430 09/27/13 0530  WBC 9.4 13.8*  HGB 11.3* 11.8*  HCT 34.5* 34.7*  PLT 241 183    Recent Labs Lab 09/26/13 1430  INR 1.28    I/O last 3 completed shifts: In: 1804.9 [I.V.:1158.3; IV Piggyback:646.7] Out: 1150 [Urine:1150]  Imaging: Dg Chest 2 View  09/26/2013   CLINICAL DATA:  No chest complaints.  Preop clot lysis  EXAM: CHEST  2 VIEW  COMPARISON:  None.  FINDINGS: The heart size and mediastinal contours are within normal limits. Both lungs are clear. The visualized skeletal structures are unremarkable.  IMPRESSION: No active cardiopulmonary disease.   Electronically Signed   By: Kathreen Devoid   On: 09/26/2013 17:53   Ir Angiogram Extremity Right  09/27/2013   CLINICAL DATA:  Peripheral vascular disease, occluded right popliteal  to peroneal bypass, right lower extremity rest pain, ischemia  EXAM: ULTRASOUND GUIDANCE FOR VASCULAR ACCESS  RIGHT LOWER EXTREMITY ANGIOGRAM  INITIATION OF RIGHT LOWER EXTREMITY ARTERIAL THROMBOLYSIS OF THE OCCLUDED POPLITEAL TO PERONEAL BYPASS  Date:  9/23/20159/23/2015 7:31 am  Radiologist:  M. Daryll Brod, MD  Guidance:  Ultrasound and fluoroscopic  FLUOROSCOPY TIME:  10 min 36 seconds  MEDICATIONS AND MEDICAL HISTORY: 1 mg Versed, 25 mcg fentanyl  ANESTHESIA/SEDATION: 30 min  CONTRAST:  82m VISIPAQUE IODIXANOL 320 MG/ML IV SOLN  COMPLICATIONS: No immediate  PROCEDURE: Informed consent was obtained from the patient following explanation of the procedure, risks, benefits and alternatives. The patient understands, agrees and consents for the procedure. All questions were addressed. A time out was performed.  Maximal barrier sterile technique utilized including caps, mask, sterile gowns, sterile gloves, large sterile drape, hand hygiene, and Betadine.  Under sterile conditions and local anesthesia, ultrasound micropuncture antegrade access performed of the right common femoral artery. Guidewire inserted followed by a 6 French sheath. Right lower extremity angiogram performed through the sheath. Right common femoral, profunda femoral, superficial femoral arteries are patent. Above knee popliteal artery occlusion noted correlating with the ultrasound Doppler exam. No significant distal runoff. Popliteal to peroneal bypass is occluded.  Kumpe catheter and Glidewire were utilized to manipulate the access through the occluded native popliteal artery and also the popliteal bypass vein graft. Contrast injection confirms position in the the distal peroneal artery  below the distal anastomosis. Guidewire exchange performed for a Bentson guidewire. 90 cm infusion catheter with a 20 cm infusion length advanced and positioned across the occluded segment from the above knee popliteal artery into the single vessel peroneal  runoff.  IMPRESSION: Acute occluded popliteal artery extending into the popliteal to peroneal bypass.  Successful catheter access for the TNK thrombolysis across the occlusion.   Electronically Signed   By: Daryll Brod M.D.   On: 09/27/2013 08:18   Ir Angiogram Selective Each Additional Vessel  09/27/2013   CLINICAL DATA:  Peripheral vascular disease, occluded right popliteal to peroneal bypass, right lower extremity rest pain, ischemia  EXAM: ULTRASOUND GUIDANCE FOR VASCULAR ACCESS  RIGHT LOWER EXTREMITY ANGIOGRAM  INITIATION OF RIGHT LOWER EXTREMITY ARTERIAL THROMBOLYSIS OF THE OCCLUDED POPLITEAL TO PERONEAL BYPASS  Date:  9/23/20159/23/2015 7:31 am  Radiologist:  M. Daryll Brod, MD  Guidance:  Ultrasound and fluoroscopic  FLUOROSCOPY TIME:  10 min 36 seconds  MEDICATIONS AND MEDICAL HISTORY: 1 mg Versed, 25 mcg fentanyl  ANESTHESIA/SEDATION: 30 min  CONTRAST:  30m VISIPAQUE IODIXANOL 320 MG/ML IV SOLN  COMPLICATIONS: No immediate  PROCEDURE: Informed consent was obtained from the patient following explanation of the procedure, risks, benefits and alternatives. The patient understands, agrees and consents for the procedure. All questions were addressed. A time out was performed.  Maximal barrier sterile technique utilized including caps, mask, sterile gowns, sterile gloves, large sterile drape, hand hygiene, and Betadine.  Under sterile conditions and local anesthesia, ultrasound micropuncture antegrade access performed of the right common femoral artery. Guidewire inserted followed by a 6 French sheath. Right lower extremity angiogram performed through the sheath. Right common femoral, profunda femoral, superficial femoral arteries are patent. Above knee popliteal artery occlusion noted correlating with the ultrasound Doppler exam. No significant distal runoff. Popliteal to peroneal bypass is occluded.  Kumpe catheter and Glidewire were utilized to manipulate the access through the occluded native  popliteal artery and also the popliteal bypass vein graft. Contrast injection confirms position in the the distal peroneal artery below the distal anastomosis. Guidewire exchange performed for a Bentson guidewire. 90 cm infusion catheter with a 20 cm infusion length advanced and positioned across the occluded segment from the above knee popliteal artery into the single vessel peroneal runoff.  IMPRESSION: Acute occluded popliteal artery extending into the popliteal to peroneal bypass.  Successful catheter access for the TNK thrombolysis across the occlusion.   Electronically Signed   By: TDaryll BrodM.D.   On: 09/27/2013 08:18   Ir [place] Vasc Access Right  09/27/2013   CLINICAL DATA:  Peripheral vascular disease, occluded right popliteal to peroneal bypass, right lower extremity rest pain, ischemia  EXAM: ULTRASOUND GUIDANCE FOR VASCULAR ACCESS  RIGHT LOWER EXTREMITY ANGIOGRAM  INITIATION OF RIGHT LOWER EXTREMITY ARTERIAL THROMBOLYSIS OF THE OCCLUDED POPLITEAL TO PERONEAL BYPASS  Date:  9/23/20159/23/2015 7:31 am  Radiologist:  M. TDaryll Brod MD  Guidance:  Ultrasound and fluoroscopic  FLUOROSCOPY TIME:  10 min 36 seconds  MEDICATIONS AND MEDICAL HISTORY: 1 mg Versed, 25 mcg fentanyl  ANESTHESIA/SEDATION: 30 min  CONTRAST:  572mVISIPAQUE IODIXANOL 320 MG/ML IV SOLN  COMPLICATIONS: No immediate  PROCEDURE: Informed consent was obtained from the patient following explanation of the procedure, risks, benefits and alternatives. The patient understands, agrees and consents for the procedure. All questions were addressed. A time out was performed.  Maximal barrier sterile technique utilized including caps, mask, sterile gowns, sterile gloves, large sterile drape, hand hygiene, and Betadine.  Under sterile conditions and local anesthesia, ultrasound micropuncture antegrade access performed of the right common femoral artery. Guidewire inserted followed by a 6 French sheath. Right lower extremity angiogram  performed through the sheath. Right common femoral, profunda femoral, superficial femoral arteries are patent. Above knee popliteal artery occlusion noted correlating with the ultrasound Doppler exam. No significant distal runoff. Popliteal to peroneal bypass is occluded.  Kumpe catheter and Glidewire were utilized to manipulate the access through the occluded native popliteal artery and also the popliteal bypass vein graft. Contrast injection confirms position in the the distal peroneal artery below the distal anastomosis. Guidewire exchange performed for a Bentson guidewire. 90 cm infusion catheter with a 20 cm infusion length advanced and positioned across the occluded segment from the above knee popliteal artery into the single vessel peroneal runoff.  IMPRESSION: Acute occluded popliteal artery extending into the popliteal to peroneal bypass.  Successful catheter access for the TNK thrombolysis across the occlusion.   Electronically Signed   By: Daryll Brod M.D.   On: 09/27/2013 08:18   Ir Infusion Thrombol Arterial Initial (ms)  09/27/2013   CLINICAL DATA:  Peripheral vascular disease, occluded right popliteal to peroneal bypass, right lower extremity rest pain, ischemia  EXAM: ULTRASOUND GUIDANCE FOR VASCULAR ACCESS  RIGHT LOWER EXTREMITY ANGIOGRAM  INITIATION OF RIGHT LOWER EXTREMITY ARTERIAL THROMBOLYSIS OF THE OCCLUDED POPLITEAL TO PERONEAL BYPASS  Date:  9/23/20159/23/2015 7:31 am  Radiologist:  M. Daryll Brod, MD  Guidance:  Ultrasound and fluoroscopic  FLUOROSCOPY TIME:  10 min 36 seconds  MEDICATIONS AND MEDICAL HISTORY: 1 mg Versed, 25 mcg fentanyl  ANESTHESIA/SEDATION: 30 min  CONTRAST:  70m VISIPAQUE IODIXANOL 320 MG/ML IV SOLN  COMPLICATIONS: No immediate  PROCEDURE: Informed consent was obtained from the patient following explanation of the procedure, risks, benefits and alternatives. The patient understands, agrees and consents for the procedure. All questions were addressed. A time out  was performed.  Maximal barrier sterile technique utilized including caps, mask, sterile gowns, sterile gloves, large sterile drape, hand hygiene, and Betadine.  Under sterile conditions and local anesthesia, ultrasound micropuncture antegrade access performed of the right common femoral artery. Guidewire inserted followed by a 6 French sheath. Right lower extremity angiogram performed through the sheath. Right common femoral, profunda femoral, superficial femoral arteries are patent. Above knee popliteal artery occlusion noted correlating with the ultrasound Doppler exam. No significant distal runoff. Popliteal to peroneal bypass is occluded.  Kumpe catheter and Glidewire were utilized to manipulate the access through the occluded native popliteal artery and also the popliteal bypass vein graft. Contrast injection confirms position in the the distal peroneal artery below the distal anastomosis. Guidewire exchange performed for a Bentson guidewire. 90 cm infusion catheter with a 20 cm infusion length advanced and positioned across the occluded segment from the above knee popliteal artery into the single vessel peroneal runoff.  IMPRESSION: Acute occluded popliteal artery extending into the popliteal to peroneal bypass.  Successful catheter access for the TNK thrombolysis across the occlusion.   Electronically Signed   By: TDaryll BrodM.D.   On: 09/27/2013 08:18    Assessment/Plan:   LOS: 1 day  s/p   Plan is repeat angiography by interventional radiology this a.m. We'll then see if any further treatment i.e. balloon angioplasty etc. is indicated.   JTinnie Gens MD 09/27/2013 8:20 AM

## 2013-09-27 NOTE — Sedation Documentation (Addendum)
Pt fully draped now for further evaluation.  Sedation not needed per Dr Anselm Pancoast

## 2013-09-27 NOTE — Progress Notes (Signed)
Patient ID: Cody Suarez, male   DOB: 1945-03-15, 68 y.o.   MRN: [id number] Vascular Surgery Progress Note  Subjective: Angiogram right leg reviewed following 18 hours of thrombolytic therapy. Popliteal to peroneal bypass graft remains occluded with some contrast in bypass and some contrast in peroneal artery. A lot of thrombus remains. There is prominent collateral which fills posterior tibial artery.  Objective:  Filed Vitals:   09/27/13 1701  BP: 153/67  Pulse: 68  Temp:   Resp: 9    Alert and oriented x3 Right foot has no rest pain or decreased sensation. It is pink and has capillary refill. Brisk Doppler flow in posterior calf proximally which is likely the collateral described above.   Labs:  Recent Labs Lab 09/26/13 1430  CREATININE 0.79    Recent Labs Lab 09/26/13 1430  NA 140  K 3.9  CL 105  CO2 22  BUN 13  CREATININE 0.79  GLUCOSE 102*  CALCIUM 8.7    Recent Labs Lab 09/26/13 1430 09/27/13 0530  WBC 9.4 13.8*  HGB 11.3* 11.8*  HCT 34.5* 34.7*  PLT 241 183    Recent Labs Lab 09/26/13 1430  INR 1.28    I/O last 3 completed shifts: In: 1937.9 [I.V.:1241.3; IV Piggyback:696.7] Out: 1150 [Urine:1150]  Imaging: Dg Chest 2 View  09/26/2013   CLINICAL DATA:  No chest complaints.  Preop clot lysis  EXAM: CHEST  2 VIEW  COMPARISON:  None.  FINDINGS: The heart size and mediastinal contours are within normal limits. Both lungs are clear. The visualized skeletal structures are unremarkable.  IMPRESSION: No active cardiopulmonary disease.   Electronically Signed   By: Kathreen Devoid   On: 09/26/2013 17:53   Ir Angiogram Extremity Right  09/27/2013   CLINICAL DATA:  Peripheral vascular disease, occluded right popliteal to peroneal bypass, right lower extremity rest pain, ischemia  EXAM: ULTRASOUND GUIDANCE FOR VASCULAR ACCESS  RIGHT LOWER EXTREMITY ANGIOGRAM  INITIATION OF RIGHT LOWER EXTREMITY ARTERIAL THROMBOLYSIS OF THE OCCLUDED POPLITEAL TO PERONEAL  BYPASS  Date:  9/23/20159/23/2015 7:31 am  Radiologist:  M. Daryll Brod, MD  Guidance:  Ultrasound and fluoroscopic  FLUOROSCOPY TIME:  10 min 36 seconds  MEDICATIONS AND MEDICAL HISTORY: 1 mg Versed, 25 mcg fentanyl  ANESTHESIA/SEDATION: 30 min  CONTRAST:  68m VISIPAQUE IODIXANOL 320 MG/ML IV SOLN  COMPLICATIONS: No immediate  PROCEDURE: Informed consent was obtained from the patient following explanation of the procedure, risks, benefits and alternatives. The patient understands, agrees and consents for the procedure. All questions were addressed. A time out was performed.  Maximal barrier sterile technique utilized including caps, mask, sterile gowns, sterile gloves, large sterile drape, hand hygiene, and Betadine.  Under sterile conditions and local anesthesia, ultrasound micropuncture antegrade access performed of the right common femoral artery. Guidewire inserted followed by a 6 French sheath. Right lower extremity angiogram performed through the sheath. Right common femoral, profunda femoral, superficial femoral arteries are patent. Above knee popliteal artery occlusion noted correlating with the ultrasound Doppler exam. No significant distal runoff. Popliteal to peroneal bypass is occluded.  Kumpe catheter and Glidewire were utilized to manipulate the access through the occluded native popliteal artery and also the popliteal bypass vein graft. Contrast injection confirms position in the the distal peroneal artery below the distal anastomosis. Guidewire exchange performed for a Bentson guidewire. 90 cm infusion catheter with a 20 cm infusion length advanced and positioned across the occluded segment from the above knee popliteal artery into the single vessel peroneal runoff.  IMPRESSION: Acute occluded popliteal artery extending into the popliteal to peroneal bypass.  Successful catheter access for the TNK thrombolysis across the occlusion.   Electronically Signed   By: Daryll Brod M.D.   On: 09/27/2013  08:18   Ir Angiogram Selective Each Additional Vessel  09/27/2013   CLINICAL DATA:  Peripheral vascular disease, occluded right popliteal to peroneal bypass, right lower extremity rest pain, ischemia  EXAM: ULTRASOUND GUIDANCE FOR VASCULAR ACCESS  RIGHT LOWER EXTREMITY ANGIOGRAM  INITIATION OF RIGHT LOWER EXTREMITY ARTERIAL THROMBOLYSIS OF THE OCCLUDED POPLITEAL TO PERONEAL BYPASS  Date:  9/23/20159/23/2015 7:31 am  Radiologist:  M. Daryll Brod, MD  Guidance:  Ultrasound and fluoroscopic  FLUOROSCOPY TIME:  10 min 36 seconds  MEDICATIONS AND MEDICAL HISTORY: 1 mg Versed, 25 mcg fentanyl  ANESTHESIA/SEDATION: 30 min  CONTRAST:  79m VISIPAQUE IODIXANOL 320 MG/ML IV SOLN  COMPLICATIONS: No immediate  PROCEDURE: Informed consent was obtained from the patient following explanation of the procedure, risks, benefits and alternatives. The patient understands, agrees and consents for the procedure. All questions were addressed. A time out was performed.  Maximal barrier sterile technique utilized including caps, mask, sterile gowns, sterile gloves, large sterile drape, hand hygiene, and Betadine.  Under sterile conditions and local anesthesia, ultrasound micropuncture antegrade access performed of the right common femoral artery. Guidewire inserted followed by a 6 French sheath. Right lower extremity angiogram performed through the sheath. Right common femoral, profunda femoral, superficial femoral arteries are patent. Above knee popliteal artery occlusion noted correlating with the ultrasound Doppler exam. No significant distal runoff. Popliteal to peroneal bypass is occluded.  Kumpe catheter and Glidewire were utilized to manipulate the access through the occluded native popliteal artery and also the popliteal bypass vein graft. Contrast injection confirms position in the the distal peroneal artery below the distal anastomosis. Guidewire exchange performed for a Bentson guidewire. 90 cm infusion catheter with a 20 cm  infusion length advanced and positioned across the occluded segment from the above knee popliteal artery into the single vessel peroneal runoff.  IMPRESSION: Acute occluded popliteal artery extending into the popliteal to peroneal bypass.  Successful catheter access for the TNK thrombolysis across the occlusion.   Electronically Signed   By: TDaryll BrodM.D.   On: 09/27/2013 08:18   Ir UK[place]Guide Vasc Access Right  09/27/2013   CLINICAL DATA:  Peripheral vascular disease, occluded right popliteal to peroneal bypass, right lower extremity rest pain, ischemia  EXAM: ULTRASOUND GUIDANCE FOR VASCULAR ACCESS  RIGHT LOWER EXTREMITY ANGIOGRAM  INITIATION OF RIGHT LOWER EXTREMITY ARTERIAL THROMBOLYSIS OF THE OCCLUDED POPLITEAL TO PERONEAL BYPASS  Date:  9/23/20159/23/2015 7:31 am  Radiologist:  M. TDaryll Brod MD  Guidance:  Ultrasound and fluoroscopic  FLUOROSCOPY TIME:  10 min 36 seconds  MEDICATIONS AND MEDICAL HISTORY: 1 mg Versed, 25 mcg fentanyl  ANESTHESIA/SEDATION: 30 min  CONTRAST:  521mVISIPAQUE IODIXANOL 320 MG/ML IV SOLN  COMPLICATIONS: No immediate  PROCEDURE: Informed consent was obtained from the patient following explanation of the procedure, risks, benefits and alternatives. The patient understands, agrees and consents for the procedure. All questions were addressed. A time out was performed.  Maximal barrier sterile technique utilized including caps, mask, sterile gowns, sterile gloves, large sterile drape, hand hygiene, and Betadine.  Under sterile conditions and local anesthesia, ultrasound micropuncture antegrade access performed of the right common femoral artery. Guidewire inserted followed by a 6 French sheath. Right lower extremity angiogram performed through the sheath. Right common femoral, profunda femoral, superficial femoral  arteries are patent. Above knee popliteal artery occlusion noted correlating with the ultrasound Doppler exam. No significant distal runoff. Popliteal to peroneal  bypass is occluded.  Kumpe catheter and Glidewire were utilized to manipulate the access through the occluded native popliteal artery and also the popliteal bypass vein graft. Contrast injection confirms position in the the distal peroneal artery below the distal anastomosis. Guidewire exchange performed for a Bentson guidewire. 90 cm infusion catheter with a 20 cm infusion length advanced and positioned across the occluded segment from the above knee popliteal artery into the single vessel peroneal runoff.  IMPRESSION: Acute occluded popliteal artery extending into the popliteal to peroneal bypass.  Successful catheter access for the TNK thrombolysis across the occlusion.   Electronically Signed   By: Daryll Brod M.D.   On: 09/27/2013 08:18   Ir Infusion Thrombol Arterial Initial (ms)  09/27/2013   CLINICAL DATA:  Peripheral vascular disease, occluded right popliteal to peroneal bypass, right lower extremity rest pain, ischemia  EXAM: ULTRASOUND GUIDANCE FOR VASCULAR ACCESS  RIGHT LOWER EXTREMITY ANGIOGRAM  INITIATION OF RIGHT LOWER EXTREMITY ARTERIAL THROMBOLYSIS OF THE OCCLUDED POPLITEAL TO PERONEAL BYPASS  Date:  9/23/20159/23/2015 7:31 am  Radiologist:  M. Daryll Brod, MD  Guidance:  Ultrasound and fluoroscopic  FLUOROSCOPY TIME:  10 min 36 seconds  MEDICATIONS AND MEDICAL HISTORY: 1 mg Versed, 25 mcg fentanyl  ANESTHESIA/SEDATION: 30 min  CONTRAST:  61m VISIPAQUE IODIXANOL 320 MG/ML IV SOLN  COMPLICATIONS: No immediate  PROCEDURE: Informed consent was obtained from the patient following explanation of the procedure, risks, benefits and alternatives. The patient understands, agrees and consents for the procedure. All questions were addressed. A time out was performed.  Maximal barrier sterile technique utilized including caps, mask, sterile gowns, sterile gloves, large sterile drape, hand hygiene, and Betadine.  Under sterile conditions and local anesthesia, ultrasound micropuncture antegrade access  performed of the right common femoral artery. Guidewire inserted followed by a 6 French sheath. Right lower extremity angiogram performed through the sheath. Right common femoral, profunda femoral, superficial femoral arteries are patent. Above knee popliteal artery occlusion noted correlating with the ultrasound Doppler exam. No significant distal runoff. Popliteal to peroneal bypass is occluded.  Kumpe catheter and Glidewire were utilized to manipulate the access through the occluded native popliteal artery and also the popliteal bypass vein graft. Contrast injection confirms position in the the distal peroneal artery below the distal anastomosis. Guidewire exchange performed for a Bentson guidewire. 90 cm infusion catheter with a 20 cm infusion length advanced and positioned across the occluded segment from the above knee popliteal artery into the single vessel peroneal runoff.  IMPRESSION: Acute occluded popliteal artery extending into the popliteal to peroneal bypass.  Successful catheter access for the TNK thrombolysis across the occlusion.   Electronically Signed   By: TDaryll BrodM.D.   On: 09/27/2013 08:18   Ir TJacolyn ReedyF/u Eval Art/ven SAlwyn RenDay (ms)  09/27/2013   CLINICAL DATA:  Occluded right popliteal-peroneal bypass graft. Catheter directed thrombolytic therapy was started on 09/26/2013.  EXAM: THROMBOLYTIC THERAPY FOLLOW-UP RIGHT LOWER EXTREMITY ARTERIOGRAM  Physician: AStephan Minister Henn, MD  FLUOROSCOPY TIME:  4 min and 12 seconds  MEDICATIONS: None  CONTRAST:  40 mL Visipaque 320  ANESTHESIA/SEDATION: Moderate sedation time: None  PROCEDURE: Patient was placed supine on the interventional table. A right lower extremity arteriogram was performed through the right groin sheath. The right groin and catheter were prepped and draped in sterile fashion. Maximal barrier sterile technique was utilized  including caps, mask, sterile gowns, sterile gloves, sterile drape, hand hygiene and skin antiseptic. The  wire was removed from the infusion catheter and additional contrast was injected through the infusion catheter. The infusion catheter was slightly pulled back within the bypass graft and additional angiograms were performed. This catheter was completely removed over a Bentson wire and a new 90 cm infusion catheter with 30 cm of infusion length was placed over the Bentson wire. The tip of the catheter was placed in the distal bypass graft. Catheters were secured to the skin.  FINDINGS: The right superficial femoral artery is widely patent. There is occlusion in the mid popliteal artery just above the knee joint. There are prominent collateral vessels at the knee which supply the calf. There is improved flow in the main medial collateral vessel just above the knee joint. There continues to be clot within the popliteal-peroneal bypass graft. Small amount of flow in the peroneal artery just beyond the graft but the distal peroneal artery is occluded. There is reconstitution of the posterior tibial artery just above the ankle.  COMPLICATIONS: None  IMPRESSION: Persistent thrombus in the distal popliteal artery and popliteal-peroneal bypass graft.  There is improved collateral flow at the knee which may be accounting for the patient's improved clinical appearance.  Plan to continue catheter-directed thrombolytic therapy. Plan for a follow-up arteriogram on 09/28/2013.   Electronically Signed   By: Markus Daft M.D.   On: 09/27/2013 17:44    Assessment/Plan:    LOS: 1 day  s/p   Discuss situation with Dr. Anselm Pancoast. Plan to continue thrombolytic therapy for another 24 hours repeat angiogram tomorrow afternoon. If this continues to be thrombose  will take patient to OR on Friday and attempt surgical thrombectomy. Problem may be that when patient had diffuse emboli 2 tibial vessels that he also embolized to distal aspect of peroneal artery which is affecting runoff to foot and causing thrombosis. Only other alternative  would be to try to extend bypass to the posterior tibial artery at the ankle but not certain that he has adequate saphenous vein for this. Will await results of repeat angiogram tomorrow afternoon and tentatively scheduled for surgery on Friday. Discusses with patient and he understands that there is significant risk of limb loss in this situation.  Tinnie Gens, MD 09/27/2013 5:59 PM

## 2013-09-27 NOTE — Sedation Documentation (Signed)
Heparin resumed at 800u/hr

## 2013-09-27 NOTE — Progress Notes (Signed)
ANTICOAGULATION CONSULT NOTE - Follow Up Consult  Pharmacy Consult for heparin Indication: DVT  No Known Allergies  Patient Measurements:  Weight: 75.1 kg 09/05/13  Vital Signs: Temp: 98.1 F (36.7 C) (09/23 0715) Temp src: Oral (09/23 0715) BP: 152/65 mmHg (09/23 0900) Pulse Rate: 87 (09/23 0900)  Labs:  Recent Labs  09/26/13 1430 09/26/13 2120 09/27/13 0005 09/27/13 0530  HGB 11.3*  --   --  11.8*  HCT 34.5*  --   --  34.7*  PLT 241  --   --  183  LABPROT 16.0*  --   --   --   INR 1.28  --   --   --   HEPARINUNFRC  --  0.18* 0.29* 0.21*  CREATININE 0.79  --   --   --     Estimated Creatinine Clearance: 91.5 ml/min (by C-G formula based on Cr of 0.79).   Assessment: Pharmacy consulted to dose heparin in 68 yo pt s/p successful LLE ANGIO with infusion cath access into vein graft below knee.  Heparin at 800 units/hr and TNKase at 0.5 mg/kg started in IR at 1900 on 9/22.  To have f/u angio on 9/23.  HL and fibrinogen levels ordered q6h per the vascular procedure order set.   Heparin level continues to be therapeutic on 800 units / hr TNK dose reduced to 0.25 mg/kg  Goal of Therapy:  Heparin level 0.2 - 0.5 units/ml Monitor platelets by anticoagulation protocol: Yes   Plan:  Continue heparin at 800 units/hr  Follow up AM labs  Thank you. Anette Guarneri, PharmD [phone]

## 2013-09-27 NOTE — Progress Notes (Signed)
VASCULAR SURGERY:  The right foot is now warm and well perfused. There is a peroneal signal with the Doppler. For follow up arteriogram today.  Deitra Mayo, MD, FACS Beeper [phone] 09/27/2013

## 2013-09-27 NOTE — Telephone Encounter (Signed)
Amber called to cancel apt states pt is in the hospital, will c/b to r/s once he is able.Marland Kitchen..KJ

## 2013-09-28 ENCOUNTER — Encounter (HOSPITAL_COMMUNITY)
Admission: AD | Disposition: A | Discharge status: Home / Self Care | Payer: Self-pay | Source: Ambulatory Visit | Attending: Vascular Surgery

## 2013-09-28 ENCOUNTER — Inpatient Hospital Stay (HOSPITAL_COMMUNITY): Payer: BC Managed Care – PPO

## 2013-09-28 LAB — CBC
HCT: 27.2 % — ABNORMAL LOW (ref 39.0–52.0)
Hemoglobin: 9.1 g/dL — ABNORMAL LOW (ref 13.0–17.0)
MCH: 32.9 pg (ref 26.0–34.0)
MCHC: 33.5 g/dL (ref 30.0–36.0)
MCV: 98.2 fL (ref 78.0–100.0)
PLATELETS: 139 10*3/uL — AB (ref 150–400)
RBC: 2.77 MIL/uL — AB (ref 4.22–5.81)
RDW: 28.2 % — ABNORMAL HIGH (ref 11.5–15.5)
WBC: 9.8 10*3/uL (ref 4.0–10.5)

## 2013-09-28 LAB — ABO/RH: ABO/RH(D): B NEG

## 2013-09-28 LAB — HEMOGLOBIN AND HEMATOCRIT, BLOOD
HCT: 25.4 % — ABNORMAL LOW (ref 39.0–52.0)
Hemoglobin: 8.4 g/dL — ABNORMAL LOW (ref 13.0–17.0)

## 2013-09-28 LAB — FIBRINOGEN
FIBRINOGEN: 176 mg/dL — AB (ref 204–475)
FIBRINOGEN: 228 mg/dL (ref 204–475)
Fibrinogen: 180 mg/dL — ABNORMAL LOW (ref 204–475)

## 2013-09-28 LAB — PREPARE RBC (CROSSMATCH)

## 2013-09-28 LAB — HEPARIN LEVEL (UNFRACTIONATED): HEPARIN UNFRACTIONATED: 0.14 [IU]/mL — AB (ref 0.30–0.70)

## 2013-09-28 SURGERY — CREATION, BYPASS, ARTERIAL, POPLITEAL TO TIBIAL, USING GRAFT
Anesthesia: General | Laterality: Right

## 2013-09-28 MED ORDER — SODIUM CHLORIDE 0.9 % IV SOLN
INTRAVENOUS | Status: AC | PRN
  Administered 2013-09-28: 999 mL/h via INTRAVENOUS

## 2013-09-28 MED ORDER — DEXTROSE 5 % IV SOLN
1.5000 g | INTRAVENOUS | Status: AC
  Filled 2013-09-28: qty 1.5

## 2013-09-28 MED ORDER — SODIUM CHLORIDE 0.9 % IV SOLN
Freq: Once | INTRAVENOUS | Status: AC
  Administered 2013-09-28: 17:00:00 via INTRAVENOUS

## 2013-09-28 MED ORDER — SODIUM CHLORIDE 0.9 % IV BOLUS (SEPSIS)
500.0000 mL | Freq: Once | INTRAVENOUS | Status: AC
  Administered 2013-09-28: 500 mL via INTRAVENOUS

## 2013-09-28 MED ORDER — ONDANSETRON HCL 4 MG/2ML IJ SOLN
INTRAMUSCULAR | Status: AC
  Administered 2013-09-28: 14:00:00
  Filled 2013-09-28: qty 2

## 2013-09-28 MED ORDER — LIDOCAINE HCL 1 % IJ SOLN
INTRAMUSCULAR | Status: AC
  Administered 2013-09-28: 14:00:00
  Filled 2013-09-28: qty 20

## 2013-09-28 MED ORDER — IODIXANOL 320 MG/ML IV SOLN
100.0000 mL | Freq: Once | INTRAVENOUS | Status: AC | PRN
  Administered 2013-09-28: 50 mL via INTRAVENOUS

## 2013-09-28 NOTE — Procedures (Signed)
Diagnosis: Right LE acute thrombus of previous Surgical bypass graft.  Day 3 of lysis with tenecteplase.   Patient has had complication of lysis with acute pelvic extraperitoneal hemorrhage, and subsequent drop in hgb.  Discussion with vascular surgery regarding need to stop lysis/heparin.  Tentative surgery for Friday.  Procedure: Lysis follow up with right LE angiogram for surgical planning.  Removal of catheters/sheaths. Manual compression of Right CFA antegrade access. 4 hours bedrest/leg straight.  No complication. No significant blood loss.  Full report of findings pending. Patient with HR 90-100, SBP 130-150.  Stable to recovery. Signed,  Dulcy Fanny. Earleen Newport, DO

## 2013-09-28 NOTE — Sedation Documentation (Signed)
Continuing to hold pressure

## 2013-09-28 NOTE — Progress Notes (Signed)
ANTICOAGULATION CONSULT NOTE  Pharmacy Consult for heparin Indication: DVT  No Known Allergies  Patient Measurements:  Weight: 75.1 kg 09/05/13  Vital Signs: Temp: 98.5 F (36.9 C) (09/24 0400) Temp src: Oral (09/24 0400) BP: 131/67 mmHg (09/24 0600) Pulse Rate: 96 (09/24 0600)  Labs:  Recent Labs  09/26/13 1430  09/27/13 0005 09/27/13 0530 09/28/13 0555  HGB 11.3*  --   --  11.8*  --   HCT 34.5*  --   --  34.7*  --   PLT 241  --   --  183  --   LABPROT 16.0*  --   --   --   --   INR 1.28  --   --   --   --   HEPARINUNFRC  --   < > 0.29* 0.21* 0.14*  CREATININE 0.79  --   --   --   --   < > = values in this interval not displayed.  Estimated Creatinine Clearance: 91.5 ml/min (by C-G formula based on Cr of 0.79).   Assessment: 68 yo male with occluded peripheral bypass graft, on TNKase, for heparin   Goal of Therapy:  Heparin level 0.2 - 0.5 units/ml Monitor platelets by anticoagulation protocol: Yes   Plan:  Increase Heparin 1000 units/hr F/U after repeat arteriogram  Phillis Knack, PharmD, BCPS

## 2013-09-28 NOTE — Sedation Documentation (Signed)
Moderate sedation not used, has MS PCA

## 2013-09-28 NOTE — Sedation Documentation (Signed)
C/O nausea, also nauseated during CT. Zofran given.

## 2013-09-28 NOTE — Progress Notes (Signed)
   Daily Progress Note  Informed of hematoma on CT by IR.  IR is planning completion injection and then removing all of the hardware.   Reportedly pt has stable BP in IR with HR in 100s currently  - Transfuse 2 u pRBC once back in ICU - Will defer to Dr. Kellie Simmering his plans for this pt tomorrow.    Adele Barthel, MD [facility] Office: [phone] Pager: [phone]  09/28/2013, 12:40 PM

## 2013-09-28 NOTE — Sedation Documentation (Signed)
Awaiting IR room availability.

## 2013-09-28 NOTE — Sedation Documentation (Signed)
Resting soundly, snoring, awaiting IR room

## 2013-09-28 NOTE — Sedation Documentation (Signed)
Sheath pulled by Dr Earleen Newport, pressure held.  PCA dose given

## 2013-09-28 NOTE — Progress Notes (Addendum)
Progress Note    09/28/2013 10:17 AM  Subjective:  C/o severe pain around catheter, which is new today; denies pain in his right foot  Afebrile HR 60's-90's 315'Q-008'Q systolic 76% RA  Filed Vitals:   09/28/13 0800  BP:   Pulse:   Temp:   Resp: 15    Physical Exam: Cardiac:  regular Lungs:  Non labored Extremities:  Monophasic doppler signal right PT Abdomen:  Tenderness to palpation on RLQ; +BS; +flatus  CBC    Component Value Date/Time   WBC 9.8 09/28/2013 0600   WBC 11.9* 09/05/2013 1059   RBC 2.77* 09/28/2013 0600   RBC 3.77* 09/05/2013 1059   HGB 9.1* 09/28/2013 0600   HGB 11.1* 09/05/2013 1059   HCT 27.2* 09/28/2013 0600   HCT 34.4* 09/05/2013 1059   PLT 139* 09/28/2013 0600   PLT 248 09/05/2013 1059   MCV 98.2 09/28/2013 0600   MCV 91.2 09/05/2013 1059   MCH 32.9 09/28/2013 0600   MCH 29.4 09/05/2013 1059   MCHC 33.5 09/28/2013 0600   MCHC 32.3 09/05/2013 1059   RDW 28.2* 09/28/2013 0600   RDW 28.5* 09/05/2013 1059   LYMPHSABS 1.4 09/05/2013 1059   LYMPHSABS 1.1 03/27/2013 0900   MONOABS 1.2* 09/05/2013 1059   MONOABS 0.9 03/27/2013 0900   EOSABS 0.2 09/05/2013 1059   EOSABS 0.2 03/27/2013 0900   BASOSABS 0.1 09/05/2013 1059   BASOSABS 0.2* 03/27/2013 0900    BMET    Component Value Date/Time   NA 140 09/26/2013 1430   NA 141 09/05/2013 1059   K 3.9 09/26/2013 1430   K 3.6 09/05/2013 1059   CL 105 09/26/2013 1430   CO2 22 09/26/2013 1430   CO2 24 09/05/2013 1059   GLUCOSE 102* 09/26/2013 1430   GLUCOSE 111 09/05/2013 1059   BUN 13 09/26/2013 1430   BUN 11.9 09/05/2013 1059   CREATININE 0.79 09/26/2013 1430   CREATININE 1.0 09/05/2013 1059   CALCIUM 8.7 09/26/2013 1430   CALCIUM 9.0 09/05/2013 1059   GFRNONAA >90 09/26/2013 1430   GFRAA >90 09/26/2013 1430    INR    Component Value Date/Time   INR 1.28 09/26/2013 1430     Intake/Output Summary (Last 24 hours) at 09/28/13 1017 Last data filed at 09/28/13 0700  Gross per 24 hour  Intake   2329 ml  Output    850 ml  Net   1479 ml      Assessment:  68 y.o. male is s/p:  acute occlusion of right popliteal to peroneal bypass performed on the August 7 Now undergoing thrombolysis via IR    Plan: -IR seeing pt now--Hgb down from 11.8 to 9.1 and platelets from 183 to 139 today with significant new abdominal tenderness.  -CT scan per radiology -will order HIT panel-heparin continued per IR    Leontine Locket, PA-C [facility] [phone] 09/28/2013 10:17 AM    Events of today noted.  Found to have intra and retroperitoneal blood during thrombolysis which was terminated.  Hb dropped to 8.4  Blood given for acute blood loss anemia.  Patient remained hemodynamically stable.  His abdominal pain has dramatically improved. The right groin sheath has been removed.  No evidence of ongoing bleeding.  Bypass remains occluded.  He has intact motor and sensory function in the right foot.  The right groin is soft. A diminuitive PT is visible on imaging from today.  Plan is for exploration in the morning by Dr. Kellie Simmering.  Annamarie Major

## 2013-09-28 NOTE — Sedation Documentation (Signed)
In IR room now, PCA pushed for pain relief for move to table.

## 2013-09-28 NOTE — Sedation Documentation (Signed)
Floor nurse here drawing blood for T&S

## 2013-09-28 NOTE — Sedation Documentation (Signed)
Dr Earleen Newport at bedside, explained CT findings and upcoming angio.  Son called and update given.

## 2013-09-29 DIAGNOSIS — Z48812 Encounter for surgical aftercare following surgery on the circulatory system: Secondary | ICD-10-CM

## 2013-09-29 LAB — CBC
HCT: 24 % — ABNORMAL LOW (ref 39.0–52.0)
HEMOGLOBIN: 8.4 g/dL — AB (ref 13.0–17.0)
MCH: 32.2 pg (ref 26.0–34.0)
MCHC: 35 g/dL (ref 30.0–36.0)
MCV: 92 fL (ref 78.0–100.0)
Platelets: 124 10*3/uL — ABNORMAL LOW (ref 150–400)
RBC: 2.61 MIL/uL — ABNORMAL LOW (ref 4.22–5.81)
RDW: 24 % — ABNORMAL HIGH (ref 11.5–15.5)
WBC: 8.9 10*3/uL (ref 4.0–10.5)

## 2013-09-29 LAB — BASIC METABOLIC PANEL
Anion gap: 9 (ref 5–15)
BUN: 12 mg/dL (ref 6–23)
CALCIUM: 7.1 mg/dL — AB (ref 8.4–10.5)
CO2: 21 mEq/L (ref 19–32)
Chloride: 106 mEq/L (ref 96–112)
Creatinine, Ser: 0.74 mg/dL (ref 0.50–1.35)
GFR calc Af Amer: >90 mL/min (ref >90)
GFR calc non Af Amer: >90 mL/min (ref >90)
Glucose, Bld: 106 mg/dL — ABNORMAL HIGH (ref 70–99)
Potassium: 3.8 mEq/L (ref 3.7–5.3)
SODIUM: 136 meq/L — AB (ref 137–147)

## 2013-09-29 LAB — PREPARE RBC (CROSSMATCH)

## 2013-09-29 LAB — FIBRINOGEN
FIBRINOGEN: 279 mg/dL (ref 204–475)
Fibrinogen: 243 mg/dL (ref 204–475)

## 2013-09-29 LAB — HEPARIN LEVEL (UNFRACTIONATED): Heparin Unfractionated: <0.1 IU/mL — ABNORMAL LOW (ref 0.30–0.70)

## 2013-09-29 MED ORDER — SODIUM CHLORIDE 0.9 % IV SOLN
Freq: Once | INTRAVENOUS | Status: AC
  Administered 2013-09-29: 10:00:00 via INTRAVENOUS

## 2013-09-29 NOTE — Progress Notes (Signed)
Patient ID: Cody Suarez, male   DOB: 1945/05/26, 68 y.o.   MRN: [id number]   Referring Physician(s): Dr Kellie Simmering  Subjective:  RLE arterial lysis 9/22- 9/24 CT reveals peritoneal bleed Pt is less painful in both R leg/foot and abd today  Allergies: Review of patient's allergies indicates no known allergies.  Medications: Prior to Admission medications   Medication Sig Start Date End Date Taking? Authorizing Provider  acetaminophen (TYLENOL) 500 MG tablet Take 1,000 mg by mouth 3 (three) times daily as needed (pain).   Yes Historical Provider, MD  Budesonide (UCERIS) 9 MG TB24 Take 9 mg by mouth daily.    Yes Historical Provider, MD  capecitabine (XELODA) 500 MG tablet Take 1,500-2,000 mg by mouth See admin instructions. Take 4 tablets (2000 mg) every morning and 3 tablets (1500 mg) every night for 14 days, hold for 7 days, then repeat 09/26/13  Yes [name], MD  Cyanocobalamin (VITAMIN B-12 SL) Place 1 tablet under the tongue 3 (three) times a week.    Yes Historical Provider, MD  HYDROcodone-acetaminophen (NORCO/VICODIN) 5-325 MG per tablet Take 1 tablet by mouth daily as needed for moderate pain. 08/29/13  Yes Suzanne L Nickel, NP  hydrocortisone cream 1 % Apply 1 application topically 2 (two) times daily as needed for itching.   Yes Historical Provider, MD  rivaroxaban (XARELTO) 20 MG TABS tablet Take 1 tablet (20 mg total) by mouth daily with breakfast. 08/14/13  Yes Rhonda G Barrett, PA-C  sildenafil (VIAGRA) 50 MG tablet Take 50 mg by mouth daily as needed for erectile dysfunction.   Yes Historical Provider, MD    Review of Systems  Vital Signs: BP 141/46  Pulse 71  Temp(Src) 98.3 F (36.8 C) (Oral)  Resp 13  Ht 5' 10.08" (1.78 m)  Wt 75 kg (165 lb 5.5 oz)  BMI 23.67 kg/m2  SpO2 97%  Physical Exam  Abdominal:  Abd soft; NT Minimal tenderness Rt flank Rt groin site with ecchymosis Soft; no hematoma palpated NT Rt foot warm; less swollen Leg overall less  swollen 2+ pulse Rt popliteal--doppler 1+ pulse Rt tibial--doppler    Imaging: Dg Chest 2 View  09/26/2013   CLINICAL DATA:  No chest complaints.  Preop clot lysis  EXAM: CHEST  2 VIEW  COMPARISON:  None.  FINDINGS: The heart size and mediastinal contours are within normal limits. Both lungs are clear. The visualized skeletal structures are unremarkable.  IMPRESSION: No active cardiopulmonary disease.   Electronically Signed   By: Kathreen Devoid   On: 09/26/2013 17:53   Ct Pelvis Wo Contrast  09/28/2013   CLINICAL DATA:  Light right lower extremity low from a lie cyst now with decreasing hemoglobin and you abdominal pain  EXAM: CT PELVIS WITHOUT CONTRAST  TECHNIQUE: Multidetector CT imaging of the pelvis was performed following the standard protocol without intravenous contrast.  COMPARISON:  CT scan of the abdomen and pelvis dated August 10, 2013  FINDINGS: There is retroperitoneal and preperitoneal hemorrhage within the right aspect of the lower abdomenas well as within the pelvis in the midline and to the right. There is soft tissue swelling over the right groin without a large visible hematoma. The femoral catheter is demonstrated down to the inferior extent of the images. The right common femoral and external iliac veins are dilated.  There is no significant intraperitoneal blood peer The pelvic visceral structures exhibit no acute abnormalities. The urinary bladder is decompressed and contains a small amount of radiodense urine.  The prostate gland, seminal vesicles, and rectosigmoid are unremarkable. The bony pelvis exhibits no acute abnormality.  IMPRESSION: 1. There is preperitoneal and retroperitoneal blood extending from the right groin through the soft tissues of the right lateral and anterior aspects of the pelvis. There is also blood extending superiorly anterior to the right psoas muscle. 2. There is dilation of the external iliac vein and common femoral vein on the right. 3. The pelvic viscera  exhibit no acute abnormalities. 4. These results were called by telephone at the time of interpretation on 09/28/2013 at 11:59 am to [facility] [place], [place], , who verbally acknowledged these results.   Electronically Signed   By: David  Martinique   On: 09/28/2013 11:59   Ir Angiogram Extremity Right  09/27/2013   CLINICAL DATA:  Peripheral vascular disease, occluded right popliteal to peroneal bypass, right lower extremity rest pain, ischemia  EXAM: ULTRASOUND GUIDANCE FOR VASCULAR ACCESS  RIGHT LOWER EXTREMITY ANGIOGRAM  INITIATION OF RIGHT LOWER EXTREMITY ARTERIAL THROMBOLYSIS OF THE OCCLUDED POPLITEAL TO PERONEAL BYPASS  Date:  9/23/20159/23/2015 7:31 am  Radiologist:  M. Daryll Brod, MD  Guidance:  Ultrasound and fluoroscopic  FLUOROSCOPY TIME:  10 min 36 seconds  MEDICATIONS AND MEDICAL HISTORY: 1 mg Versed, 25 mcg fentanyl  ANESTHESIA/SEDATION: 30 min  CONTRAST:  41m VISIPAQUE IODIXANOL 320 MG/ML IV SOLN  COMPLICATIONS: No immediate  PROCEDURE: Informed consent was obtained from the patient following explanation of the procedure, risks, benefits and alternatives. The patient understands, agrees and consents for the procedure. All questions were addressed. A time out was performed.  Maximal barrier sterile technique utilized including caps, mask, sterile gowns, sterile gloves, large sterile drape, hand hygiene, and Betadine.  Under sterile conditions and local anesthesia, ultrasound micropuncture antegrade access performed of the right common femoral artery. Guidewire inserted followed by a 6 French sheath. Right lower extremity angiogram performed through the sheath. Right common femoral, profunda femoral, superficial femoral arteries are patent. Above knee popliteal artery occlusion noted correlating with the ultrasound Doppler exam. No significant distal runoff. Popliteal to peroneal bypass is occluded.  Kumpe catheter and Glidewire were utilized to manipulate the access through the occluded native popliteal  artery and also the popliteal bypass vein graft. Contrast injection confirms position in the the distal peroneal artery below the distal anastomosis. Guidewire exchange performed for a Bentson guidewire. 90 cm infusion catheter with a 20 cm infusion length advanced and positioned across the occluded segment from the above knee popliteal artery into the single vessel peroneal runoff.  IMPRESSION: Acute occluded popliteal artery extending into the popliteal to peroneal bypass.  Successful catheter access for the TNK thrombolysis across the occlusion.   Electronically Signed   By: TDaryll BrodM.D.   On: 09/27/2013 08:18   Ir Angiogram Selective Each Additional Vessel  09/27/2013   CLINICAL DATA:  Peripheral vascular disease, occluded right popliteal to peroneal bypass, right lower extremity rest pain, ischemia  EXAM: ULTRASOUND GUIDANCE FOR VASCULAR ACCESS  RIGHT LOWER EXTREMITY ANGIOGRAM  INITIATION OF RIGHT LOWER EXTREMITY ARTERIAL THROMBOLYSIS OF THE OCCLUDED POPLITEAL TO PERONEAL BYPASS  Date:  9/23/20159/23/2015 7:31 am  Radiologist:  M. TDaryll Brod MD  Guidance:  Ultrasound and fluoroscopic  FLUOROSCOPY TIME:  10 min 36 seconds  MEDICATIONS AND MEDICAL HISTORY: 1 mg Versed, 25 mcg fentanyl  ANESTHESIA/SEDATION: 30 min  CONTRAST:  582mVISIPAQUE IODIXANOL 320 MG/ML IV SOLN  COMPLICATIONS: No immediate  PROCEDURE: Informed consent was obtained from the patient following explanation of the procedure, risks, benefits  and alternatives. The patient understands, agrees and consents for the procedure. All questions were addressed. A time out was performed.  Maximal barrier sterile technique utilized including caps, mask, sterile gowns, sterile gloves, large sterile drape, hand hygiene, and Betadine.  Under sterile conditions and local anesthesia, ultrasound micropuncture antegrade access performed of the right common femoral artery. Guidewire inserted followed by a 6 French sheath. Right lower extremity angiogram  performed through the sheath. Right common femoral, profunda femoral, superficial femoral arteries are patent. Above knee popliteal artery occlusion noted correlating with the ultrasound Doppler exam. No significant distal runoff. Popliteal to peroneal bypass is occluded.  Kumpe catheter and Glidewire were utilized to manipulate the access through the occluded native popliteal artery and also the popliteal bypass vein graft. Contrast injection confirms position in the the distal peroneal artery below the distal anastomosis. Guidewire exchange performed for a Bentson guidewire. 90 cm infusion catheter with a 20 cm infusion length advanced and positioned across the occluded segment from the above knee popliteal artery into the single vessel peroneal runoff.  IMPRESSION: Acute occluded popliteal artery extending into the popliteal to peroneal bypass.  Successful catheter access for the TNK thrombolysis across the occlusion.   Electronically Signed   By: Daryll Brod M.D.   On: 09/27/2013 08:18   Ir US Guide Vasc Access Right  09/27/2013   CLINICAL DATA:  Peripheral vascular disease, occluded right popliteal to peroneal bypass, right lower extremity rest pain, ischemia  EXAM: ULTRASOUND GUIDANCE FOR VASCULAR ACCESS  RIGHT LOWER EXTREMITY ANGIOGRAM  INITIATION OF RIGHT LOWER EXTREMITY ARTERIAL THROMBOLYSIS OF THE OCCLUDED POPLITEAL TO PERONEAL BYPASS  Date:  9/23/20159/23/2015 7:31 am  Radiologist:  M. Daryll Brod, MD  Guidance:  Ultrasound and fluoroscopic  FLUOROSCOPY TIME:  10 min 36 seconds  MEDICATIONS AND MEDICAL HISTORY: 1 mg Versed, 25 mcg fentanyl  ANESTHESIA/SEDATION: 30 min  CONTRAST:  28m VISIPAQUE IODIXANOL 320 MG/ML IV SOLN  COMPLICATIONS: No immediate  PROCEDURE: Informed consent was obtained from the patient following explanation of the procedure, risks, benefits and alternatives. The patient understands, agrees and consents for the procedure. All questions were addressed. A time out was performed.   Maximal barrier sterile technique utilized including caps, mask, sterile gowns, sterile gloves, large sterile drape, hand hygiene, and Betadine.  Under sterile conditions and local anesthesia, ultrasound micropuncture antegrade access performed of the right common femoral artery. Guidewire inserted followed by a 6 French sheath. Right lower extremity angiogram performed through the sheath. Right common femoral, profunda femoral, superficial femoral arteries are patent. Above knee popliteal artery occlusion noted correlating with the ultrasound Doppler exam. No significant distal runoff. Popliteal to peroneal bypass is occluded.  Kumpe catheter and Glidewire were utilized to manipulate the access through the occluded native popliteal artery and also the popliteal bypass vein graft. Contrast injection confirms position in the the distal peroneal artery below the distal anastomosis. Guidewire exchange performed for a Bentson guidewire. 90 cm infusion catheter with a 20 cm infusion length advanced and positioned across the occluded segment from the above knee popliteal artery into the single vessel peroneal runoff.  IMPRESSION: Acute occluded popliteal artery extending into the popliteal to peroneal bypass.  Successful catheter access for the TNK thrombolysis across the occlusion.   Electronically Signed   By: TDaryll BrodM.D.   On: 09/27/2013 08:18   Ir Infusion Thrombol Arterial Initial (ms)  09/27/2013   CLINICAL DATA:  Peripheral vascular disease, occluded right popliteal to peroneal bypass, right lower extremity rest pain,  ischemia  EXAM: ULTRASOUND GUIDANCE FOR VASCULAR ACCESS  RIGHT LOWER EXTREMITY ANGIOGRAM  INITIATION OF RIGHT LOWER EXTREMITY ARTERIAL THROMBOLYSIS OF THE OCCLUDED POPLITEAL TO PERONEAL BYPASS  Date:  9/23/20159/23/2015 7:31 am  Radiologist:  M. Daryll Brod, MD  Guidance:  Ultrasound and fluoroscopic  FLUOROSCOPY TIME:  10 min 36 seconds  MEDICATIONS AND MEDICAL HISTORY: 1 mg Versed, 25 mcg  fentanyl  ANESTHESIA/SEDATION: 30 min  CONTRAST:  36m VISIPAQUE IODIXANOL 320 MG/ML IV SOLN  COMPLICATIONS: No immediate  PROCEDURE: Informed consent was obtained from the patient following explanation of the procedure, risks, benefits and alternatives. The patient understands, agrees and consents for the procedure. All questions were addressed. A time out was performed.  Maximal barrier sterile technique utilized including caps, mask, sterile gowns, sterile gloves, large sterile drape, hand hygiene, and Betadine.  Under sterile conditions and local anesthesia, ultrasound micropuncture antegrade access performed of the right common femoral artery. Guidewire inserted followed by a 6 French sheath. Right lower extremity angiogram performed through the sheath. Right common femoral, profunda femoral, superficial femoral arteries are patent. Above knee popliteal artery occlusion noted correlating with the ultrasound Doppler exam. No significant distal runoff. Popliteal to peroneal bypass is occluded.  Kumpe catheter and Glidewire were utilized to manipulate the access through the occluded native popliteal artery and also the popliteal bypass vein graft. Contrast injection confirms position in the the distal peroneal artery below the distal anastomosis. Guidewire exchange performed for a Bentson guidewire. 90 cm infusion catheter with a 20 cm infusion length advanced and positioned across the occluded segment from the above knee popliteal artery into the single vessel peroneal runoff.  IMPRESSION: Acute occluded popliteal artery extending into the popliteal to peroneal bypass.  Successful catheter access for the TNK thrombolysis across the occlusion.   Electronically Signed   By: TDaryll BrodM.D.   On: 09/27/2013 08:18   Ir TJacolyn ReedyF/u Eval Art/ven SAlwyn RenDay (ms)  09/27/2013   CLINICAL DATA:  Occluded right popliteal-peroneal bypass graft. Catheter directed thrombolytic therapy was started on 09/26/2013.  EXAM:  THROMBOLYTIC THERAPY FOLLOW-UP RIGHT LOWER EXTREMITY ARTERIOGRAM  Physician: AStephan Minister Henn, MD  FLUOROSCOPY TIME:  4 min and 12 seconds  MEDICATIONS: None  CONTRAST:  40 mL Visipaque 320  ANESTHESIA/SEDATION: Moderate sedation time: None  PROCEDURE: Patient was placed supine on the interventional table. A right lower extremity arteriogram was performed through the right groin sheath. The right groin and catheter were prepped and draped in sterile fashion. Maximal barrier sterile technique was utilized including caps, mask, sterile gowns, sterile gloves, sterile drape, hand hygiene and skin antiseptic. The wire was removed from the infusion catheter and additional contrast was injected through the infusion catheter. The infusion catheter was slightly pulled back within the bypass graft and additional angiograms were performed. This catheter was completely removed over a Bentson wire and a new 90 cm infusion catheter with 30 cm of infusion length was placed over the Bentson wire. The tip of the catheter was placed in the distal bypass graft. Catheters were secured to the skin.  FINDINGS: The right superficial femoral artery is widely patent. There is occlusion in the mid popliteal artery just above the knee joint. There are prominent collateral vessels at the knee which supply the calf. There is improved flow in the main medial collateral vessel just above the knee joint. There continues to be clot within the popliteal-peroneal bypass graft. Small amount of flow in the peroneal artery just beyond the graft but the distal  peroneal artery is occluded. There is reconstitution of the posterior tibial artery just above the ankle.  COMPLICATIONS: None  IMPRESSION: Persistent thrombus in the distal popliteal artery and popliteal-peroneal bypass graft.  There is improved collateral flow at the knee which may be accounting for the patient's improved clinical appearance.  Plan to continue catheter-directed thrombolytic therapy.  Plan for a follow-up arteriogram on 09/28/2013.   Electronically Signed   By: Markus Daft M.D.   On: 09/27/2013 17:44   Ir Jacolyn Reedy F/u Eval Art/ven Final Day (ms)  09/28/2013   INDICATION: 68 year old male with occlusion of right lower extremity popliteal to peroneal bypass. Likely previous thromboembolic event.  The patient has undergone greater than 48 hours of lysis upon initiation of this procedure. Most recently he has encountered bleeding of his extraperitoneal space as a complication of the thrombolysis, which at this time has been stopped.  EXAM: 1. RIGHT LOWER EXTREMITY ANGIOGRAPHY  COMPARISON:  MULTIPLE PRIOR, MOST RECENT 09/26/2013  MEDICATIONS: NO SEDATING MEDICATIONS  Sedation time:  0 minutes  CONTRAST:  100 CC OF VISIPAQUE 320  FLUOROSCOPY TIME:  One minutes 54 seconds.  COMPLICATIONS: None immediate  TECHNIQUE: Informed written consent was obtained from the patient after a discussion of the risks, benefits and alternatives to treatment. Questions regarding the procedure were encouraged and answered. A timeout was performed prior to the initiation of the procedure.  The obturator wire of the indwelling Unifuse catheter was removed, and a 0.035 Bentson wire was placed through the Unifuse catheter. The Unifuse catheter was then withdrawn from the antegrade sheath of the right common femoral artery.  Angiogram of the right lower extremity was then performed. For the stations below the knee, a Kumpe catheter was placed into the distal SFA/proximal popliteal artery over the Bentson wire with the Bentson wire removed. Angiogram was then performed through the Kumpe the catheter at the proximal popliteal artery.  After completion of the angiogram, catheters and sheaths were removed, and manual compression was used for hemostasis. A pressure dressing was placed at the puncture site of the right common femoral artery.  The patient tolerated the procedure well and remained hemodynamically stable throughout.  No  complications were encountered and no significant blood loss was encountered.  FINDINGS: Normal course caliber and contour of the visualized common femoral artery and superficial femoral artery to the adductor canal.  The profunda femoris is unremarkable, with multiple collateral filling vessels. No stenosis of the superficial femoral artery identified.  Occlusion of the popliteal artery at the level of the joint space. Collateral, geniculate arteries contribute to collateral pathways of the tibia. Only a trickle of flow was present into the tibioperoneal trunk.  Attenuated size of the posterior tibial artery, which fills via collateral flow at the distal tibia. The common plantar artery and a portion of the plantar arch are intact via this collateral flow. No significant flow within the peroneal artery for the anterior tibial artery.  IMPRESSION: Status post 48 hours of thrombolysis of the right lower extremity demonstrates persistent occlusion of the popliteal artery. There is no in-line flow to the ankle, with collateral flow partially filling the distal 3rd of the posterior tibial artery. The common plantar artery and a portion of the plantar arch fills via the posterior tibial artery.  Signed,  Dulcy Fanny. Earleen Newport, DO  [facility]  [facility]   Electronically Signed   By: Corrie Mckusick O.D.   On: 09/28/2013 18:41    Labs: Results for  orders placed during the hospital encounter of 09/26/13 (from the past 48 hour(s))  FIBRINOGEN     Status: Abnormal   Collection Time    09/27/13 12:25 PM      Result Value Ref Range   Fibrinogen 123 (*) 204 - 475 mg/dL  FIBRINOGEN     Status: Abnormal   Collection Time    09/27/13  5:00 PM      Result Value Ref Range   Fibrinogen 155 (*) 204 - 475 mg/dL  FIBRINOGEN     Status: Abnormal   Collection Time    09/28/13 12:08 AM      Result Value Ref Range   Fibrinogen 176 (*) 204 - 475 mg/dL  HEPARIN LEVEL  (UNFRACTIONATED)     Status: Abnormal   Collection Time    09/28/13  5:55 AM      Result Value Ref Range   Heparin Unfractionated 0.14 (*) 0.30 - 0.70 IU/mL   Comment:            IF HEPARIN RESULTS ARE BELOW     EXPECTED VALUES, AND PATIENT     DOSAGE HAS BEEN CONFIRMED,     SUGGEST FOLLOW UP TESTING     OF ANTITHROMBIN III LEVELS.  CBC     Status: Abnormal   Collection Time    09/28/13  6:00 AM      Result Value Ref Range   WBC 9.8  4.0 - 10.5 K/uL   RBC 2.77 (*) 4.22 - 5.81 MIL/uL   Hemoglobin 9.1 (*) 13.0 - 17.0 g/dL   Comment: REPEATED TO VERIFY   HCT 27.2 (*) 39.0 - 52.0 %   MCV 98.2  78.0 - 100.0 fL   MCH 32.9  26.0 - 34.0 pg   MCHC 33.5  30.0 - 36.0 g/dL   RDW 28.2 (*) 11.5 - 15.5 %   Platelets 139 (*) 150 - 400 K/uL   Comment: REPEATED TO VERIFY     SPECIMEN CHECKED FOR CLOTS  FIBRINOGEN     Status: Abnormal   Collection Time    09/28/13  6:00 AM      Result Value Ref Range   Fibrinogen 180 (*) 204 - 475 mg/dL  HEMOGLOBIN AND HEMATOCRIT, BLOOD     Status: Abnormal   Collection Time    09/28/13 10:30 AM      Result Value Ref Range   Hemoglobin 8.4 (*) 13.0 - 17.0 g/dL   HCT 25.4 (*) 39.0 - 52.0 %  PREPARE RBC (CROSSMATCH)     Status: None   Collection Time    09/28/13  1:30 PM      Result Value Ref Range   Order Confirmation ORDER PROCESSED BY BLOOD BANK    TYPE AND SCREEN     Status: None   Collection Time    09/28/13  1:30 PM      Result Value Ref Range   ABO/RH(D) B NEG     Antibody Screen NEG     Sample Expiration 10/01/2013     Unit Number [id number]     Blood Component Type RBC LR PHER2     Unit division 00     Status of Unit ISSUED     Transfusion Status OK TO TRANSFUSE     Crossmatch Result Compatible     Unit Number [id number]     Blood Component Type RED CELLS,LR     Unit division 00     Status of Unit ISSUED  Transfusion Status OK TO TRANSFUSE     Crossmatch Result Compatible    ABO/RH     Status: None   Collection Time     09/28/13  1:30 PM      Result Value Ref Range   ABO/RH(D) B NEG    FIBRINOGEN     Status: None   Collection Time    09/28/13  3:38 PM      Result Value Ref Range   Fibrinogen 228  204 - 475 mg/dL  FIBRINOGEN     Status: None   Collection Time    09/28/13 11:40 PM      Result Value Ref Range   Fibrinogen 243  204 - 475 mg/dL  CBC     Status: Abnormal   Collection Time    09/29/13  6:00 AM      Result Value Ref Range   WBC 8.9  4.0 - 10.5 K/uL   RBC 2.61 (*) 4.22 - 5.81 MIL/uL   Hemoglobin 8.4 (*) 13.0 - 17.0 g/dL   HCT 24.0 (*) 39.0 - 52.0 %   MCV 92.0  78.0 - 100.0 fL   MCH 32.2  26.0 - 34.0 pg   MCHC 35.0  30.0 - 36.0 g/dL   RDW 24.0 (*) 11.5 - 15.5 %   Platelets 124 (*) 150 - 400 K/uL  BASIC METABOLIC PANEL     Status: Abnormal   Collection Time    09/29/13  6:00 AM      Result Value Ref Range   Sodium 136 (*) 137 - 147 mEq/L   Potassium 3.8  3.7 - 5.3 mEq/L   Comment: HEMOLYSIS AT THIS LEVEL MAY AFFECT RESULT   Chloride 106  96 - 112 mEq/L   CO2 21  19 - 32 mEq/L   Glucose, Bld 106 (*) 70 - 99 mg/dL   BUN 12  6 - 23 mg/dL   Creatinine, Ser 0.74  0.50 - 1.35 mg/dL   Calcium 7.1 (*) 8.4 - 10.5 mg/dL   GFR calc non Af Amer >90  >90 mL/min   GFR calc Af Amer >90  >90 mL/min   Comment: (NOTE)     The eGFR has been calculated using the CKD EPI equation.     This calculation has not been validated in all clinical situations.     eGFR's persistently <90 mL/min signify possible Chronic Kidney     Disease.   Anion gap 9  5 - 15  FIBRINOGEN     Status: None   Collection Time    09/29/13  6:00 AM      Result Value Ref Range   Fibrinogen 279  204 - 475 mg/dL  HEPARIN LEVEL (UNFRACTIONATED)     Status: Abnormal   Collection Time    09/29/13  6:00 AM      Result Value Ref Range   Heparin Unfractionated <0.10 (*) 0.30 - 0.70 IU/mL   Comment:            IF HEPARIN RESULTS ARE BELOW     EXPECTED VALUES, AND PATIENT     DOSAGE HAS BEEN CONFIRMED,     SUGGEST FOLLOW UP  TESTING     OF ANTITHROMBIN III LEVELS.     RESULT REPEATED AND VERIFIED    Assessment and Plan:  RLE arterial lysis 9/22- 9/24 Noted peritoneal bleed Much less pain ion leg/foot and abd today Plan per Dr Kellie Simmering   I spent a total of 20 minutes face to  face in clinical consultation/evaluation, greater than 50% of which was counseling/coordinating care for RLE arterial lysis.  Signed: Opha Mcghee A 09/29/2013, 8:05 AM

## 2013-09-29 NOTE — Progress Notes (Addendum)
VASCULAR LAB PRELIMINARY  ARTERIAL= Duplex of right popliteal to peroneal vein graft. Findings of a 50-99% stenosis of the proximal anastomosis and 50-99% stenosis at mid graft.  ABI completed:   RIGHT    LEFT    PRESSURE WAVEFORM  PRESSURE WAVEFORM  BRACHIAL Not obtained due to bandages  BRACHIAL 148 Tri  DP   DP    AT Not audible  AT 70 Mono   PT 72 Mono  PT Not audible   PER 60 Mono  PER 51 Mono   GREAT TOE  NA GREAT TOE  NA    RIGHT LEFT  ABI 0.49 0.47     Landry Mellow, RDMS, RVT  09/29/2013, 9:36 AM

## 2013-09-29 NOTE — Progress Notes (Signed)
Patient ID: Cody Suarez, male   DOB: 12-07-1945, 68 y.o.   MRN: [id number] Vascular Surgery Progress Note  Subjective  Thrombolysis catheter discontinued yesterday because of bleeding. Patient has had no further bleeding during night. Received 2 units packed red blood cells last p.m. States right foot feels better with no pain or numbness. Patient currently on no anti-coagulation. 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  Objective:  Filed Vitals:   09/29/13 0700  BP: 141/46  Pulse: 71  Temp:   Resp: 13    Alert and oriented x3 in no apparent distress Abdomen soft nontender Right inguinal area with ecchymosis but 3+ femoral pulse palpable Excellent biphasic flow in right posterior tibial artery an audible flow in right peroneal artery. Right foot has good capillary refill with no tenderness and good sensation.   Labs:  Recent Labs Lab 09/26/13 1430 09/29/13 0600  CREATININE 0.79 0.74    Recent Labs Lab 09/26/13 1430 09/29/13 0600  NA 140 136*  K 3.9 3.8  CL 105 106  CO2 22 21  BUN 13 12  CREATININE 0.79 0.74  GLUCOSE 102* 106*  CALCIUM 8.7 7.1*    Recent Labs Lab 09/27/13 0530 09/28/13 0600 09/28/13 1030 09/29/13 0600  WBC 13.8* 9.8  --  8.9  HGB 11.8* 9.1* 8.4* 8.4*  HCT 34.7* 27.2* 25.4* 24.0*  PLT 183 139*  --  124*    Recent Labs Lab 09/26/13 1430  INR 1.28    I/O last 3 completed shifts: In: 4082.5 [I.V.:2957.5; Blood:675; IV Piggyback:450] Out: 8250 [Urine:1275]  Imaging: Ct Pelvis Wo Contrast  09/28/2013   CLINICAL DATA:  Light right lower extremity low from a lie cyst now with decreasing hemoglobin and you abdominal pain  EXAM: CT PELVIS WITHOUT CONTRAST  TECHNIQUE: Multidetector CT imaging of the pelvis was performed following the standard protocol without intravenous contrast.  COMPARISON:  CT scan of the abdomen and pelvis dated August 10, 2013   FINDINGS: There is retroperitoneal and preperitoneal hemorrhage within the right aspect of the lower abdomenas well as within the pelvis in the midline and to the right. There is soft tissue swelling over the right groin without a large visible hematoma. The femoral catheter is demonstrated down to the inferior extent of the images. The right common femoral and external iliac veins are dilated.  There is no significant intraperitoneal blood peer The pelvic visceral structures exhibit no acute abnormalities. The urinary bladder is decompressed and contains a small amount of radiodense urine. The prostate gland, seminal vesicles, and rectosigmoid are unremarkable. The bony pelvis exhibits no acute abnormality.  IMPRESSION: 1. There is preperitoneal and retroperitoneal blood extending from the right groin through the soft tissues of the right lateral and anterior aspects of the pelvis. There is also blood extending superiorly anterior to the right psoas muscle. 2. There is dilation of the external iliac vein and common femoral vein on the right. 3. The pelvic viscera exhibit no acute abnormalities. 4. These results were called by telephone at the time of interpretation on 09/28/2013 at 11:59 am to [facility], [place], , who verbally acknowledged these results.   Electronically Signed   By: David  Martinique   On: 09/28/2013 11:59   Ir Jacolyn Reedy F/u Eval Art/ven Alwyn Ren Day (ms)  09/27/2013   CLINICAL DATA:  Occluded right popliteal-peroneal bypass graft. Catheter directed thrombolytic therapy was started on 09/26/2013.  EXAM: THROMBOLYTIC THERAPY FOLLOW-UP RIGHT  LOWER EXTREMITY ARTERIOGRAM  Physician: Stephan Minister. Henn, MD  FLUOROSCOPY TIME:  4 min and 12 seconds  MEDICATIONS: None  CONTRAST:  40 mL Visipaque 320  ANESTHESIA/SEDATION: Moderate sedation time: None  PROCEDURE: Patient was placed supine on the interventional table. A right lower extremity arteriogram was performed through the right groin sheath. The right groin and  catheter were prepped and draped in sterile fashion. Maximal barrier sterile technique was utilized including caps, mask, sterile gowns, sterile gloves, sterile drape, hand hygiene and skin antiseptic. The wire was removed from the infusion catheter and additional contrast was injected through the infusion catheter. The infusion catheter was slightly pulled back within the bypass graft and additional angiograms were performed. This catheter was completely removed over a Bentson wire and a new 90 cm infusion catheter with 30 cm of infusion length was placed over the Bentson wire. The tip of the catheter was placed in the distal bypass graft. Catheters were secured to the skin.  FINDINGS: The right superficial femoral artery is widely patent. There is occlusion in the mid popliteal artery just above the knee joint. There are prominent collateral vessels at the knee which supply the calf. There is improved flow in the main medial collateral vessel just above the knee joint. There continues to be clot within the popliteal-peroneal bypass graft. Small amount of flow in the peroneal artery just beyond the graft but the distal peroneal artery is occluded. There is reconstitution of the posterior tibial artery just above the ankle.  COMPLICATIONS: None  IMPRESSION: Persistent thrombus in the distal popliteal artery and popliteal-peroneal bypass graft.  There is improved collateral flow at the knee which may be accounting for the patient's improved clinical appearance.  Plan to continue catheter-directed thrombolytic therapy. Plan for a follow-up arteriogram on 09/28/2013.   Electronically Signed   By: Markus Daft M.D.   On: 09/27/2013 17:44   Ir Jacolyn Reedy F/u Eval Art/ven Final Day (ms)  09/28/2013   INDICATION: 68 year old male with occlusion of right lower extremity popliteal to peroneal bypass. Likely previous thromboembolic event.  The patient has undergone greater than 48 hours of lysis upon initiation of this  procedure. Most recently he has encountered bleeding of his extraperitoneal space as a complication of the thrombolysis, which at this time has been stopped.  EXAM: 1. RIGHT LOWER EXTREMITY ANGIOGRAPHY  COMPARISON:  MULTIPLE PRIOR, MOST RECENT 09/26/2013  MEDICATIONS: NO SEDATING MEDICATIONS  Sedation time:  0 minutes  CONTRAST:  100 CC OF VISIPAQUE 320  FLUOROSCOPY TIME:  One minutes 54 seconds.  COMPLICATIONS: None immediate  TECHNIQUE: Informed written consent was obtained from the patient after a discussion of the risks, benefits and alternatives to treatment. Questions regarding the procedure were encouraged and answered. A timeout was performed prior to the initiation of the procedure.  The obturator wire of the indwelling Unifuse catheter was removed, and a 0.035 Bentson wire was placed through the Unifuse catheter. The Unifuse catheter was then withdrawn from the antegrade sheath of the right common femoral artery.  Angiogram of the right lower extremity was then performed. For the stations below the knee, a Kumpe catheter was placed into the distal SFA/proximal popliteal artery over the Bentson wire with the Bentson wire removed. Angiogram was then performed through the Kumpe the catheter at the proximal popliteal artery.  After completion of the angiogram, catheters and sheaths were removed, and manual compression was used for hemostasis. A pressure dressing was placed at the puncture site of the right common  femoral artery.  The patient tolerated the procedure well and remained hemodynamically stable throughout.  No complications were encountered and no significant blood loss was encountered.  FINDINGS: Normal course caliber and contour of the visualized common femoral artery and superficial femoral artery to the adductor canal.  The profunda femoris is unremarkable, with multiple collateral filling vessels. No stenosis of the superficial femoral artery identified.  Occlusion of the popliteal artery at  the level of the joint space. Collateral, geniculate arteries contribute to collateral pathways of the tibia. Only a trickle of flow was present into the tibioperoneal trunk.  Attenuated size of the posterior tibial artery, which fills via collateral flow at the distal tibia. The common plantar artery and a portion of the plantar arch are intact via this collateral flow. No significant flow within the peroneal artery for the anterior tibial artery.  IMPRESSION: Status post 48 hours of thrombolysis of the right lower extremity demonstrates persistent occlusion of the popliteal artery. There is no in-line flow to the ankle, with collateral flow partially filling the distal 3rd of the posterior tibial artery. The common plantar artery and a portion of the plantar arch fills via the posterior tibial artery.  Signed,  Dulcy Fanny. Earleen Newport, DO  [facility]  [facility]   Electronically Signed   By: Corrie Mckusick O.D.   On: 09/28/2013 18:41    Assessment/Plan:    LOS: 3 days  s/p Procedure(s): THROMBECTOMY & REVISION OF POPLITEAL-PERONEAL BYPASS  Had long discussion with patient. I do not think we should attempt thrombectomy of this graft since patient has excellent biphasic flow in posterior tibial artery which must be via collaterals. This is the best flow via his head since we performed the procedure. He is asymptomatic. He has no good conduit to bypass down to the ankle level. As long as flow as this do that would not be indicated.  Plan duplex scan of right leg bypass this morning with ABI and will likely not perform surgery today as long as there is good flow in the foot. Do not want to remove saphenous vein from the contralateral left leg because he does have issues on that side with emboli the tibial vessels. Discussed all this with patient and he is in total agreement. Will transfuse 2 more units packed red blood cells since patient's hemoglobin continues to be  8.   Tinnie Gens, MD 09/29/2013 7:40 AM

## 2013-09-30 LAB — TYPE AND SCREEN
ABO/RH(D): B NEG
ANTIBODY SCREEN: NEGATIVE
UNIT DIVISION: 0
UNIT DIVISION: 0
Unit division: 0
Unit division: 0

## 2013-09-30 LAB — CBC
HCT: 29.8 % — ABNORMAL LOW (ref 39.0–52.0)
HEMOGLOBIN: 10.3 g/dL — AB (ref 13.0–17.0)
MCH: 30.9 pg (ref 26.0–34.0)
MCHC: 34.6 g/dL (ref 30.0–36.0)
MCV: 89.5 fL (ref 78.0–100.0)
Platelets: 117 10*3/uL — ABNORMAL LOW (ref 150–400)
RBC: 3.33 MIL/uL — AB (ref 4.22–5.81)
RDW: 21.3 % — AB (ref 11.5–15.5)
WBC: 8.5 10*3/uL (ref 4.0–10.5)

## 2013-09-30 MED ORDER — RIVAROXABAN 20 MG PO TABS
20.0000 mg | ORAL_TABLET | Freq: Every day | ORAL | Status: DC
  Administered 2013-09-30 – 2013-10-01 (×2): 20 mg via ORAL
  Filled 2013-09-30 (×4): qty 1

## 2013-09-30 MED ORDER — METRONIDAZOLE 500 MG PO TABS
500.0000 mg | ORAL_TABLET | Freq: Three times a day (TID) | ORAL | Status: DC
  Administered 2013-09-30 – 2013-10-01 (×3): 500 mg via ORAL
  Filled 2013-09-30 (×6): qty 1

## 2013-09-30 NOTE — Progress Notes (Signed)
Vascular Surgery Progress Note Notified by RN that patient is C.diff positive. Will start on flagyl.   Virgina Jock, PA-C Vascular Surgery [phone]

## 2013-09-30 NOTE — Progress Notes (Signed)
Patient ID: Cody Suarez, male   DOB: [birth year], 68 y.o.   MRN: [id number] Vascular Surgery Progress Note  Subjective: Post thrombolysis of right popliteal to peroneal saphenous vein graft. Patient states his right foot feels fine with no pain or numbness. Unclear whether vein graft is actually patent. He did not appear patent at the end of thrombolysis for 48 hours but duplex scan yesterday suggested it was tightened with 2 areas of stenosis. He is on no anticoagulants at present time and is asymptomatic.  Objective:  Filed Vitals:   09/30/13 0800  BP: 140/61  Pulse: 57  Temp: 97.6 F (36.4 C)  Resp: 17    General alert and oriented x3 Right lower extremity with 3+ femoral and 2+ popliteal pulse palpable. Brisk biphasic flow in right posterior tibial artery and audible flow in right peroneal artery with ABI 0.49 yesterday Right foot with decreased edema and pink well perfused good motion and sensation   Labs:  Recent Labs Lab 09/26/13 1430 09/29/13 0600  CREATININE 0.79 0.74    Recent Labs Lab 09/26/13 1430 09/29/13 0600  NA 140 136*  K 3.9 3.8  CL 105 106  CO2 22 21  BUN 13 12  CREATININE 0.79 0.74  GLUCOSE 102* 106*  CALCIUM 8.7 7.1*    Recent Labs Lab 09/28/13 0600 09/28/13 1030 09/29/13 0600 09/30/13 0255  WBC 9.8  --  8.9 8.5  HGB 9.1* 8.4* 8.4* 10.3*  HCT 27.2* 25.4* 24.0* 29.8*  PLT 139*  --  124* 117*    Recent Labs Lab 09/26/13 1430  INR 1.28    I/O last 3 completed shifts: In: 67 [I.V.:3300; Blood:1031; IV Piggyback:150] Out: 3825 [Urine:3825]  Imaging: Ct Pelvis Wo Contrast  09/28/2013   CLINICAL DATA:  Light right lower extremity low from a lie cyst now with decreasing hemoglobin and you abdominal pain  EXAM: CT PELVIS WITHOUT CONTRAST  TECHNIQUE: Multidetector CT imaging of the pelvis was performed following the standard protocol without intravenous contrast.  COMPARISON:  CT scan of the abdomen and pelvis dated August 10, 2013   FINDINGS: There is retroperitoneal and preperitoneal hemorrhage within the right aspect of the lower abdomenas well as within the pelvis in the midline and to the right. There is soft tissue swelling over the right groin without a large visible hematoma. The femoral catheter is demonstrated down to the inferior extent of the images. The right common femoral and external iliac veins are dilated.  There is no significant intraperitoneal blood peer The pelvic visceral structures exhibit no acute abnormalities. The urinary bladder is decompressed and contains a small amount of radiodense urine. The prostate gland, seminal vesicles, and rectosigmoid are unremarkable. The bony pelvis exhibits no acute abnormality.  IMPRESSION: 1. There is preperitoneal and retroperitoneal blood extending from the right groin through the soft tissues of the right lateral and anterior aspects of the pelvis. There is also blood extending superiorly anterior to the right psoas muscle. 2. There is dilation of the external iliac vein and common femoral vein on the right. 3. The pelvic viscera exhibit no acute abnormalities. 4. These results were called by telephone at the time of interpretation on 09/28/2013 at 11:59 am to [facility], [place], , who verbally acknowledged these results.   Electronically Signed   By: David  Martinique   On: 09/28/2013 11:59   Ir Jacolyn Reedy F/u Eval Art/ven Final Day (ms)  09/28/2013   INDICATION: 68 year old male with occlusion of right lower extremity popliteal to peroneal  bypass. Likely previous thromboembolic event.  The patient has undergone greater than 48 hours of lysis upon initiation of this procedure. Most recently he has encountered bleeding of his extraperitoneal space as a complication of the thrombolysis, which at this time has been stopped.  EXAM: 1. RIGHT LOWER EXTREMITY ANGIOGRAPHY  COMPARISON:  MULTIPLE PRIOR, MOST RECENT 09/26/2013  MEDICATIONS: NO SEDATING MEDICATIONS  Sedation time:  0 minutes   CONTRAST:  100 CC OF VISIPAQUE 320  FLUOROSCOPY TIME:  One minutes 54 seconds.  COMPLICATIONS: None immediate  TECHNIQUE: Informed written consent was obtained from the patient after a discussion of the risks, benefits and alternatives to treatment. Questions regarding the procedure were encouraged and answered. A timeout was performed prior to the initiation of the procedure.  The obturator wire of the indwelling Unifuse catheter was removed, and a 0.035 Bentson wire was placed through the Unifuse catheter. The Unifuse catheter was then withdrawn from the antegrade sheath of the right common femoral artery.  Angiogram of the right lower extremity was then performed. For the stations below the knee, a Kumpe catheter was placed into the distal SFA/proximal popliteal artery over the Bentson wire with the Bentson wire removed. Angiogram was then performed through the Kumpe the catheter at the proximal popliteal artery.  After completion of the angiogram, catheters and sheaths were removed, and manual compression was used for hemostasis. A pressure dressing was placed at the puncture site of the right common femoral artery.  The patient tolerated the procedure well and remained hemodynamically stable throughout.  No complications were encountered and no significant blood loss was encountered.  FINDINGS: Normal course caliber and contour of the visualized common femoral artery and superficial femoral artery to the adductor canal.  The profunda femoris is unremarkable, with multiple collateral filling vessels. No stenosis of the superficial femoral artery identified.  Occlusion of the popliteal artery at the level of the joint space. Collateral, geniculate arteries contribute to collateral pathways of the tibia. Only a trickle of flow was present into the tibioperoneal trunk.  Attenuated size of the posterior tibial artery, which fills via collateral flow at the distal tibia. The common plantar artery and a portion of the  plantar arch are intact via this collateral flow. No significant flow within the peroneal artery for the anterior tibial artery.  IMPRESSION: Status post 48 hours of thrombolysis of the right lower extremity demonstrates persistent occlusion of the popliteal artery. There is no in-line flow to the ankle, with collateral flow partially filling the distal 3rd of the posterior tibial artery. The common plantar artery and a portion of the plantar arch fills via the posterior tibial artery.  Signed,  Dulcy Fanny. Earleen Newport, DO  [facility]  [facility]   Electronically Signed   By: Corrie Mckusick O.D.   On: 09/28/2013 18:41    Assessment/Plan:    LOS: 4 days  s/p Procedure(s): THROMBECTOMY & REVISION OF POPLITEAL-PERONEAL BYPASS  Discuss situation again with patient and we agree that the best plan at this point is not to proceed with any attempt at thrombectomy. This finding graft may have been occluded for several days or weeks since he does have a good collateral supplying his posterior tibial artery. He has no good vein for future bypass.  Plan is to resume  Alen Blew today Transferred to 2 W. Begin ambulation Possibly DC home in a.m. and followup in office  [facility]  09/30/2013 8:53 AM

## 2013-10-01 LAB — CBC
HCT: 29.7 % — ABNORMAL LOW (ref 39.0–52.0)
Hemoglobin: 10.3 g/dL — ABNORMAL LOW (ref 13.0–17.0)
MCH: 31.4 pg (ref 26.0–34.0)
MCHC: 34.7 g/dL (ref 30.0–36.0)
MCV: 90.5 fL (ref 78.0–100.0)
PLATELETS: 149 10*3/uL — AB (ref 150–400)
RBC: 3.28 MIL/uL — AB (ref 4.22–5.81)
RDW: 21.3 % — ABNORMAL HIGH (ref 11.5–15.5)
WBC: 7.5 10*3/uL (ref 4.0–10.5)

## 2013-10-01 LAB — CLOSTRIDIUM DIFFICILE BY PCR: Toxigenic C. Difficile by PCR: POSITIVE — AB

## 2013-10-01 MED ORDER — RIVAROXABAN 20 MG PO TABS: 20.0000 mg | ORAL_TABLET | Freq: Every day | ORAL | Status: DC

## 2013-10-01 MED ORDER — METRONIDAZOLE 500 MG PO TABS: 500.0000 mg | ORAL_TABLET | Freq: Three times a day (TID) | ORAL | Status: DC

## 2013-10-01 MED ORDER — HYDROCODONE-ACETAMINOPHEN 5-325 MG PO TABS: 1.0000 | ORAL_TABLET | Freq: Two times a day (BID) | ORAL | Status: DC | PRN

## 2013-10-01 NOTE — Progress Notes (Signed)
Discharged to home with family office visits in place teaching done  

## 2013-10-01 NOTE — Progress Notes (Addendum)
  [facility] Progress Note  10/01/2013 9:38 AM  Subjective:  Doing well. Ready to go home.    Filed Vitals:   10/01/13 0448  BP: 156/68  Pulse: 58  Temp: 98.1 F (36.7 C)  Resp: 16    Physical Exam: Incisions:  No hematoma right groin. Some ecchymosis. Right medial leg incision c/d/i Extremities:  2+ femoral pulse. Doppler signals right PT and DP. Well perfused.   CBC    Component Value Date/Time   WBC 7.5 10/01/2013 0405   WBC 11.9* 09/05/2013 1059   RBC 3.28* 10/01/2013 0405   RBC 3.77* 09/05/2013 1059   HGB 10.3* 10/01/2013 0405   HGB 11.1* 09/05/2013 1059   HCT 29.7* 10/01/2013 0405   HCT 34.4* 09/05/2013 1059   PLT 149* 10/01/2013 0405   PLT 248 09/05/2013 1059   MCV 90.5 10/01/2013 0405   MCV 91.2 09/05/2013 1059   MCH 31.4 10/01/2013 0405   MCH 29.4 09/05/2013 1059   MCHC 34.7 10/01/2013 0405   MCHC 32.3 09/05/2013 1059   RDW 21.3* 10/01/2013 0405   RDW 28.5* 09/05/2013 1059   LYMPHSABS 1.4 09/05/2013 1059   LYMPHSABS 1.1 03/27/2013 0900   MONOABS 1.2* 09/05/2013 1059   MONOABS 0.9 03/27/2013 0900   EOSABS 0.2 09/05/2013 1059   EOSABS 0.2 03/27/2013 0900   BASOSABS 0.1 09/05/2013 1059   BASOSABS 0.2* 03/27/2013 0900    BMET    Component Value Date/Time   NA 136* 09/29/2013 0600   NA 141 09/05/2013 1059   K 3.8 09/29/2013 0600   K 3.6 09/05/2013 1059   CL 106 09/29/2013 0600   CO2 21 09/29/2013 0600   CO2 24 09/05/2013 1059   GLUCOSE 106* 09/29/2013 0600   GLUCOSE 111 09/05/2013 1059   BUN 12 09/29/2013 0600   BUN 11.9 09/05/2013 1059   CREATININE 0.74 09/29/2013 0600   CREATININE 1.0 09/05/2013 1059   CALCIUM 7.1* 09/29/2013 0600   CALCIUM 9.0 09/05/2013 1059   GFRNONAA >90 09/29/2013 0600   GFRAA >90 09/29/2013 0600    INR    Component Value Date/Time   INR 1.28 09/26/2013 1430     Intake/Output Summary (Last 24 hours) at 10/01/13 4259 Last data filed at 10/01/13 0900  Gross per 24 hour  Intake    360 ml  Output    350 ml  Net     10 ml     Assessment:  68 y.o.  male is s/p:  RLE arterial lysis 9/22-9/24  Plan: -2+ right femoral pulse with doppler signals of right DP and PT -Ambulating well. -Flagyl for C. diff.  -Will discharge home today on xarelto. -Follow up in 4 weeks with Dr. Kellie Simmering.   Virgina Jock, PA-C [facility] Office: [phone] Pager: [phone] 10/01/2013 9:38 AM  Continues to have excellent posterior tibial flow right foot which is biphasic and audible peroneal flow. I should ambulate yesterday without difficulty Alen Blew has been resumed  DC home today return to see me in 4 weeks with duplex scan of bypass and ABIs

## 2013-10-01 NOTE — Discharge Summary (Signed)
[facility] Discharge Summary  ADEM COSTLOW 1945-03-11 68 y.o. male  [id number]  Admission Date: 09/26/2013  Discharge Date: 10/01/2013  Physician: Mal Misty, MD  Admission Diagnosis: occluded right popliteal teroneal bypass PAD (peripheral artery disease)    HPI:   This is a 68 y.o. male who underwent right popliteal to peroneal bypass using saphenous vein by Dr. La08/07/2013. Patient had presented with history of multiple emboli to both legs having occurred 8 weeks earlier. His bypass was functioning well and he had no claudication in either lower extremity until today at work when he was on a ladder and developed severe right calf discomfort. This has slightly improved and he has had some tingling in the right foot. He denies rest pain in the right foot. He came to the office where ultrasound revealed occlusion of the right to peroneal bypass and he will be admitted for treatment. He took [place] yesterday morning but did not take a dose today.  Hospital Course:  The patient was admitted to the hospital and underwent right lower extremity arteriogram and thrombolysis by interventional radiology on 09/26/13. He had a follow up arteriogram on 09/27/13 that demonstrated persistent occlusion of his popliteal-peroneal bypass graft. Dr. Kellie Simmering tentatively planned surgical intervention for Friday 09/29/13. He was continued on thrombolytic therapy through 09/28/13. On 09/28/13, the patient had a hematoma on CT by IR. He was transfused with 2 units of pRBCs. Thrombolysis was discontinued. His angiogram before catheter removal revealed occlusion of his bypass. He had palpable 3+ femoral pulse and excellent flow in his right posterior tibial artery and right peroneal artery.   On 09/29/13, he no further bleeding. He had a duplex scan of his right leg bypass with ABIs which revealed:   ARTERIAL= Duplex of right popliteal to peroneal vein graft. Findings of a 50-99% stenosis of  the proximal anastomosis and 50-99% stenosis at mid graft.  ABI completed:    RIGHT  LEFT   ABI  0.49  0.47    He was transfused two more units of PRBCs on 09/29/13 since his hemoglobin continued to be 8g/dL  On 09/30/13, it was discussed that the patient proceed without any attempt at thrombectomy. His graft had been occluded for several days or weeks, but he had a good collateral supplying his posterior tibial artery. He has no good veins for future bypass. He was started back on xarelto.   On 10/01/13, he continued to have excellent posterior tibial flow right foot which is biphasic and audible peroneal flow. He was able to ambulate without difficulty. He was discharged home on hospital day 5 in good condition. He will follow up in 4 weeks with Dr. Kellie Simmering with duplex scan of bypass and ABIs.    CBC    Component Value Date/Time   WBC 7.5 10/01/2013 0405   WBC 11.9* 09/05/2013 1059   RBC 3.28* 10/01/2013 0405   RBC 3.77* 09/05/2013 1059   HGB 10.3* 10/01/2013 0405   HGB 11.1* 09/05/2013 1059   HCT 29.7* 10/01/2013 0405   HCT 34.4* 09/05/2013 1059   PLT 149* 10/01/2013 0405   PLT 248 09/05/2013 1059   MCV 90.5 10/01/2013 0405   MCV 91.2 09/05/2013 1059   MCH 31.4 10/01/2013 0405   MCH 29.4 09/05/2013 1059   MCHC 34.7 10/01/2013 0405   MCHC 32.3 09/05/2013 1059   RDW 21.3* 10/01/2013 0405   RDW 28.5* 09/05/2013 1059   LYMPHSABS 1.4 09/05/2013 1059   LYMPHSABS 1.1 03/27/2013 0900  MONOABS 1.2* 09/05/2013 1059   MONOABS 0.9 03/27/2013 0900   EOSABS 0.2 09/05/2013 1059   EOSABS 0.2 03/27/2013 0900   BASOSABS 0.1 09/05/2013 1059   BASOSABS 0.2* 03/27/2013 0900    BMET    Component Value Date/Time   NA 136* 09/29/2013 0600   NA 141 09/05/2013 1059   K 3.8 09/29/2013 0600   K 3.6 09/05/2013 1059   CL 106 09/29/2013 0600   CO2 21 09/29/2013 0600   CO2 24 09/05/2013 1059   GLUCOSE 106* 09/29/2013 0600   GLUCOSE 111 09/05/2013 1059   BUN 12 09/29/2013 0600   BUN 11.9 09/05/2013 1059   CREATININE 0.74 09/29/2013 0600    CREATININE 1.0 09/05/2013 1059   CALCIUM 7.1* 09/29/2013 0600   CALCIUM 9.0 09/05/2013 1059   GFRNONAA >90 09/29/2013 0600   GFRAA >90 09/29/2013 0600     Discharge Instructions:   The patient is discharged to home with extensive instructions on wound care and progressive ambulation.  They are instructed not to drive or perform any heavy lifting until returning to see the physician in his office.  Discharge Instructions   Call MD for:  redness, tenderness, or signs of infection (pain, swelling, bleeding, redness, odor or green/yellow discharge around incision site)    Complete by:  As directed      Call MD for:  severe or increased pain, loss or decreased feeling  in affected limb(s)    Complete by:  As directed      Call MD for:  temperature >100.5    Complete by:  As directed      Driving Restrictions    Complete by:  As directed   No driving for 2 weeks     Increase activity slowly    Complete by:  As directed   Walk with assistance use walker or cane as needed  No working for 4 weeks.     Lifting restrictions    Complete by:  As directed   No lifting for 4 weeks     Resume previous diet    Complete by:  As directed      may wash over wound with mild soap and water    Complete by:  As directed            Discharge Diagnosis:  occluded right popliteal teroneal bypass PAD (peripheral artery disease)   Secondary Diagnosis: Patient Active Problem List   Diagnosis Date Noted  . PVD (peripheral vascular disease) 09/26/2013  . Occlusion of bypass graft 09/26/2013  . Aftercare following surgery of the circulatory system, [place] 08/29/2013  . Penetrating atherosclerotic ulcer of aorta 08/14/2013  . Hypertension   . Arterial thromboembolism 08/09/2013  . Claudication 07/14/2013  . 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 Right inguinal hernia 03/23/2013  . Umbilical hernia 02/77/4128  . Ulcerative colitis, unspecified 03/23/2013   Past Medical  History  Diagnosis Date  . Diarrhea   . Hypertension   . Ulcerative colitis     history  . Anemia of chronic disease 2015    "related to cancer tx" (08/09/2013)  . Arthritis     "thumb joints" (08/09/2013)  . Kidney stones X 2    "passed them both"  . Colon cancer 03/2013       Medication List         acetaminophen 500 MG tablet  Commonly known as:  TYLENOL  Take 1,000 mg by mouth 3 (three) times daily  as needed (pain).     capecitabine 500 MG tablet  Commonly known as:  XELODA  Take 1,500-2,000 mg by mouth See admin instructions. Take 4 tablets (2000 mg) every morning and 3 tablets (1500 mg) every night for 14 days, hold for 7 days, then repeat     HYDROcodone-acetaminophen 5-325 MG per tablet  Commonly known as:  NORCO/VICODIN  Take 1 tablet by mouth daily as needed for moderate pain.     HYDROcodone-acetaminophen 5-325 MG per tablet  Commonly known as:  NORCO/VICODIN  Take 1 tablet by mouth 2 (two) times daily as needed for moderate pain.     hydrocortisone cream 1 %  Apply 1 application topically 2 (two) times daily as needed for itching.     metroNIDAZOLE 500 MG tablet  Commonly known as:  FLAGYL  Take 1 tablet (500 mg total) by mouth 3 (three) times daily.     rivaroxaban 20 MG Tabs tablet  Commonly known as:  XARELTO  Take 1 tablet (20 mg total) by mouth daily with breakfast.     sildenafil 50 MG tablet  Commonly known as:  VIAGRA  Take 50 mg by mouth daily as needed for erectile dysfunction.     UCERIS 9 MG Tb24  Generic drug:  Budesonide  Take 9 mg by mouth daily.     VITAMIN B-12 SL  Place 1 tablet under the tongue 3 (three) times a week.        Norco #30 No Refill  Disposition: home  Patient's condition: is Good  Follow up: 1. Dr. Kellie Simmering in 4 weeks   Virgina Jock, PA-C [facility] [phone] 10/01/2013  11:48 AM

## 2013-10-02 ENCOUNTER — Other Ambulatory Visit: Payer: Self-pay | Admitting: *Deleted

## 2013-10-02 DIAGNOSIS — Z48812 Encounter for surgical aftercare following surgery on the circulatory system: Secondary | ICD-10-CM

## 2013-10-02 DIAGNOSIS — I739 Peripheral vascular disease, unspecified: Secondary | ICD-10-CM

## 2013-10-02 LAB — HEPARIN INDUCED THROMBOCYTOPENIA PNL
Heparin Induced Plt Ab: NEGATIVE
Patient O.D.: 0.038
UFH High Dose UFH H: 0 % Release
UFH LOW DOSE 0.1 IU/ML: 0 %
UFH LOW DOSE 0.5 IU/ML: 0 %
UFH SRA Result: NEGATIVE

## 2013-10-03 ENCOUNTER — Telehealth: Payer: Self-pay | Admitting: Vascular Surgery

## 2013-10-03 ENCOUNTER — Ambulatory Visit: Payer: BC Managed Care – PPO | Admitting: Cardiovascular Disease

## 2013-10-03 ENCOUNTER — Other Ambulatory Visit: Payer: Self-pay | Admitting: *Deleted

## 2013-10-03 DIAGNOSIS — I739 Peripheral vascular disease, unspecified: Secondary | ICD-10-CM

## 2013-10-03 MED ORDER — RIVAROXABAN 20 MG PO TABS: 20.0000 mg | ORAL_TABLET | Freq: Every day | ORAL | Status: DC

## 2013-10-03 NOTE — Telephone Encounter (Signed)
Message copied by Georgiann Mccoy on Tue Oct 03, 2013 12:26 PM ------      Message from: Peter Minium K      Created: Mon Oct 02, 2013  9:26 AM      Regarding: Schedule                   ----- Message -----         From: Alvia Grove, PA-C         Sent: 10/01/2013  10:08 AM           To: [facility] Charge Pool            S/p lysis of RLE with interventional radiology 9/22-9/24            F/u with Dr. Kellie Simmering in 4 weeks with duplex scan right leg graft and ABIs b/l            Thanks      Maudie Mercury ------

## 2013-10-03 NOTE — Telephone Encounter (Addendum)
Spoke to pt to sch appt  sched labs 10/30/13 @ 11 and 11:30  sched md appt 10/31/13 @ 11:45  Message copied by Georgiann Mccoy on Tue Oct 03, 2013 12:26 PM ------      Message from: Peter Minium K      Created: Mon Oct 02, 2013  9:26 AM      Regarding: Schedule                   ----- Message -----         From: Alvia Grove, PA-C         Sent: 10/01/2013  10:08 AM           To: [facility] Charge Pool            S/p lysis of RLE with interventional radiology 9/22-9/24            F/u with Dr. Kellie Simmering in 4 weeks with duplex scan right leg graft and ABIs b/l            Thanks      Maudie Mercury ------

## 2013-10-07 ENCOUNTER — Telehealth: Payer: Self-pay | Admitting: Oncology

## 2013-10-07 NOTE — Telephone Encounter (Signed)
returned pt call adn lvm for pt with new d.t of appt.Marland KitchenMarland Kitchen

## 2013-10-11 ENCOUNTER — Telehealth: Payer: Self-pay | Admitting: *Deleted

## 2013-10-11 NOTE — Telephone Encounter (Signed)
Patient calling to confirm if it is okay to receive flu vaccine while on Xeloda; explained to patient he can receive it, if elsewhere, to bring date so that we can have it in our system. Patient voiced understanding.

## 2013-10-12 ENCOUNTER — Encounter: Payer: Self-pay | Admitting: *Deleted

## 2013-10-12 NOTE — Progress Notes (Signed)
RECEIVED A FAX FROM [employer] CONCERNING A CONFIRMATION OF PRESCRIPTION SHIPMENT FOR CAPECITABINE ON 10/11/13.

## 2013-10-20 DIAGNOSIS — Z0279 Encounter for issue of other medical certificate: Secondary | ICD-10-CM

## 2013-10-26 ENCOUNTER — Other Ambulatory Visit: Payer: Self-pay | Admitting: *Deleted

## 2013-10-26 ENCOUNTER — Telehealth: Payer: Self-pay

## 2013-10-26 DIAGNOSIS — C184 Malignant neoplasm of transverse colon: Secondary | ICD-10-CM

## 2013-10-26 NOTE — Telephone Encounter (Signed)
I spoke with Mr Cody Suarez, we arranged for his Vlabs on 10/23 and to return and see JDL on 10/27. Patient is willing to come 2 separate days in order to get this taken care of, dpm

## 2013-10-26 NOTE — Telephone Encounter (Signed)
Phone call from pt.  Reported a 2 day hx. of intermittent pain in right calf.  Stated this pain is similar to what he experienced before, when his graft occluded.  Denies swelling of the right lower extremity.  Continues on Xarelto daily.  Discussed with Dr. Oneida Alar; due to known right pop-peroneal BP graft occlusion, recommended to move pt's appt. to earlier date, to see Dr. Kellie Simmering.  Pt made aware of recommendation to move appt. up; to call pt. back with details of appt.

## 2013-10-26 NOTE — Telephone Encounter (Signed)
THIS REFILL REQUEST FOR CAPECITABINE WAS GIVEN TO DR.SHERRILL'S NURSE, SUSAN COWARD,RN.

## 2013-10-27 ENCOUNTER — Ambulatory Visit (HOSPITAL_COMMUNITY)
Admission: RE | Admit: 2013-10-27 | Discharge: 2013-10-27 | Disposition: A | Discharge status: Home / Self Care | Payer: BC Managed Care – PPO | Source: Ambulatory Visit | Attending: Family | Admitting: Family

## 2013-10-27 ENCOUNTER — Ambulatory Visit (INDEPENDENT_AMBULATORY_CARE_PROVIDER_SITE_OTHER)
Admission: RE | Admit: 2013-10-27 | Discharge: 2013-10-27 | Disposition: A | Discharge status: Home / Self Care | Payer: BC Managed Care – PPO | Source: Ambulatory Visit | Attending: Family | Admitting: Family

## 2013-10-27 ENCOUNTER — Other Ambulatory Visit: Payer: Self-pay | Admitting: Family

## 2013-10-27 DIAGNOSIS — Z48812 Encounter for surgical aftercare following surgery on the circulatory system: Secondary | ICD-10-CM

## 2013-10-27 DIAGNOSIS — I739 Peripheral vascular disease, unspecified: Secondary | ICD-10-CM

## 2013-10-27 MED ORDER — CAPECITABINE 500 MG PO TABS: 1500.0000 mg | ORAL_TABLET | ORAL | Status: DC

## 2013-10-27 NOTE — Telephone Encounter (Signed)
RECEIVED A FAX FROM [facility] CONCERNING A CONFIRMATION OF FACSIMILE RECEIPT FOR PT. REFERRAL.

## 2013-10-27 NOTE — Addendum Note (Signed)
Addended by: Wyonia Hough on: 10/27/2013 10:57 AM   Modules accepted: Orders

## 2013-10-30 ENCOUNTER — Encounter: Payer: Self-pay | Admitting: Vascular Surgery

## 2013-10-30 ENCOUNTER — Encounter (HOSPITAL_COMMUNITY): Payer: BC Managed Care – PPO

## 2013-10-30 ENCOUNTER — Other Ambulatory Visit (HOSPITAL_COMMUNITY): Payer: BC Managed Care – PPO

## 2013-10-31 ENCOUNTER — Encounter: Payer: Self-pay | Admitting: Vascular Surgery

## 2013-10-31 ENCOUNTER — Ambulatory Visit: Payer: BC Managed Care – PPO | Admitting: Vascular Surgery

## 2013-10-31 ENCOUNTER — Ambulatory Visit (INDEPENDENT_AMBULATORY_CARE_PROVIDER_SITE_OTHER): Payer: Self-pay | Admitting: Vascular Surgery

## 2013-10-31 ENCOUNTER — Other Ambulatory Visit (HOSPITAL_BASED_OUTPATIENT_CLINIC_OR_DEPARTMENT_OTHER): Payer: BC Managed Care – PPO

## 2013-10-31 ENCOUNTER — Ambulatory Visit (HOSPITAL_BASED_OUTPATIENT_CLINIC_OR_DEPARTMENT_OTHER): Payer: BC Managed Care – PPO | Admitting: Nurse Practitioner

## 2013-10-31 VITALS — BP 185/86 | HR 91 | Temp 97.3°F | Resp 18 | Ht 70.08 in | Wt 165.1 lb

## 2013-10-31 VITALS — BP 178/82 | HR 93 | Ht 70.8 in | Wt 167.9 lb

## 2013-10-31 DIAGNOSIS — I7419 Embolism and thrombosis of other parts of aorta: Secondary | ICD-10-CM

## 2013-10-31 DIAGNOSIS — C184 Malignant neoplasm of transverse colon: Secondary | ICD-10-CM

## 2013-10-31 DIAGNOSIS — D509 Iron deficiency anemia, unspecified: Secondary | ICD-10-CM

## 2013-10-31 DIAGNOSIS — Z48812 Encounter for surgical aftercare following surgery on the circulatory system: Secondary | ICD-10-CM

## 2013-10-31 DIAGNOSIS — I70219 Atherosclerosis of native arteries of extremities with intermittent claudication, unspecified extremity: Secondary | ICD-10-CM

## 2013-10-31 DIAGNOSIS — I739 Peripheral vascular disease, unspecified: Secondary | ICD-10-CM

## 2013-10-31 LAB — CBC WITH DIFFERENTIAL/PLATELET
BASO%: 0.4 % (ref 0.0–2.0)
Basophils Absolute: 0 10*3/uL (ref 0.0–0.1)
EOS%: 0.6 % (ref 0.0–7.0)
Eosinophils Absolute: 0 10*3/uL (ref 0.0–0.5)
HCT: 42.3 % (ref 38.4–49.9)
HGB: 13.7 g/dL (ref 13.0–17.1)
LYMPH%: 12.9 % — AB (ref 14.0–49.0)
MCH: 32.5 pg (ref 27.2–33.4)
MCHC: 32.4 g/dL (ref 32.0–36.0)
MCV: 100.2 fL — ABNORMAL HIGH (ref 79.3–98.0)
MONO#: 0.8 10*3/uL (ref 0.1–0.9)
MONO%: 10.3 % (ref 0.0–14.0)
NEUT#: 5.9 10*3/uL (ref 1.5–6.5)
NEUT%: 75.8 % — AB (ref 39.0–75.0)
PLATELETS: 250 10*3/uL (ref 140–400)
RBC: 4.22 10*6/uL (ref 4.20–5.82)
RDW: 24.1 % — ABNORMAL HIGH (ref 11.0–14.6)
WBC: 7.8 10*3/uL (ref 4.0–10.3)
lymph#: 1 10*3/uL (ref 0.9–3.3)

## 2013-10-31 NOTE — Addendum Note (Signed)
Addended by: Mena Goes on: 10/31/2013 04:47 PM   Modules accepted: Orders

## 2013-10-31 NOTE — Progress Notes (Signed)
  [facility] OFFICE PROGRESS NOTE   Diagnosis:  Colon cancer  INTERVAL HISTORY:   Cody Suarez returns as scheduled. He completed cycle 6 Xeloda beginning 09/11/2013.   He was hospitalized 09/26/2013 through 10/01/2013 with right calf pain. Ultrasound showed occlusion of the right popliteal peroneal bypass. Thrombolysis was initiated. He was discharged home on Xarelto.   He reports he is currently on Xeloda beginning the cycle on 10/23/2013. He denies nausea/vomiting. No mouth sores. No diarrhea. No hand or foot pain or redness.  He reports his blood pressure medications are being adjusted.  Objective:  Vital signs in last 24 hours:  Blood pressure 185/86, pulse 91, temperature 97.3 F (36.3 C), temperature source Oral, resp. rate 18, height 5' 10.08" (1.78 m), weight 165 lb 1.6 oz (74.889 kg). repeat blood pressure 176/85    HEENT: No thrush or ulcers. Resp: Lungs clear bilaterally. Cardio: Regular rate and rhythm. GI: Abdomen soft and nontender. No hepatomegaly. Vascular: No leg edema.  Skin: Palms with mild erythema, dryness. No skin breakdown.    Lab Results:  Lab Results  Component Value Date   WBC 7.8 10/31/2013   HGB 13.7 10/31/2013   HCT 42.3 10/31/2013   MCV 100.2* 10/31/2013   PLT 250 10/31/2013   NEUTROABS 5.9 10/31/2013    Imaging:  No results found.  Medications: I have reviewed the patient's current medications.  Assessment/Plan: 1. 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 Negative for a BRAF mutation Tumor invaded through the muscularis propria into pericolonic fatty tissue and involved the attached omentum.  Cycle 1 adjuvant Xeloda 05/28/2013.  Cycle 2 adjuvant Xeloda 06/18/2013.  Cycle 3 adjuvant Xeloda 07/09/2013.  Cycle 4 adjuvant Xeloda 07/30/2013.    Cycle 5 adjuvant Xeloda 08/20/2013. Cycle 6 adjuvant Xeloda 09/11/2013. 2. Ulcerative colitis-extensive chronic active ulcerative colitis was noted on the colon resection specimen 03/28/2013. 3. Hypertension.  4. Microcytic anemia-likely iron deficiency, unable to tolerate oral iron. Improved. 5. Possible area of cecal wall thickening noted on abdominal CT 03/20/2013. 6. Family history of colon cancer. 7. Bilateral calf and low anterior leg pain. Bilateral lower extremity venous duplex negative for DVT 07/14/2013. Right ABI with moderate, borderline severe arterial insufficiency; left ABI suggestive of moderate arterial insufficiency. He was referred to vascular. 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 TEE showed thrombus in the descending aorta. He underwent right popliteal to peroneal artery bypass graft, thrombectomy anterior tibialis and attempted thrombectomy tibioperoneal trunk and posterior tibialis on 08/11/2013. Right calf pain 09/26/2013 with findings of occlusion of right popliteal to peroneal bypass status post thrombolysis. Now on anticoagulation with Xarelto. The pain is better.   Disposition: Cody Suarez appears stable. He missed a followup visit in September due to a hospitalization. He began the most recent cycle of Xeloda on 10/23/2013. It is unclear if this represents cycle #7 or #8. He has the dates at home that he began each cycle of Xeloda. He will call the office back today with this information.  He will return for a followup visit in approximately 3 weeks. He will contact the office in the interim as outlined above or with any problems.    Ned Card ANP/GNP-BC   10/31/2013  12:21 PM

## 2013-10-31 NOTE — Progress Notes (Signed)
Subjective:     Patient ID: Cody Suarez, male   DOB: 11-18-1945, 68 y.o.   MRN: [id number]  HPI this 68 year old male returns for continued follow-up regarding his peripheral emboli to both lower extremities which occurred in June 2015. He did not seek medical attention until August and I attempted a popliteal to peroneal bypass on the right with saphenous vein. This occluded in October and we elected not to proceed with any further attempts at revascularization. Since then he has had stable calf claudication symptoms in the right leg with no symptoms in the left leg which also had peripheral emboli. He is able to ambulate at least 1 block without stopping. He denies rest pain or any nonhealing ulcers or infection since discharge. He is on Xeralto and is followed by Dr. Audelia Acton. Review of Systems     Objective:   Physical Exam BP 178/82  Pulse 93  Ht 5' 10.8" (1.798 m)  Wt 167 lb 14.4 oz (76.159 kg)  BMI 23.56 kg/m2  SpO2 100%  General well-developed well-nourished male in no apparent distress alert and oriented 3 Lungs no rhonchi or wheezing Right leg with 3+ femoral pulse palpable. No popliteal or distal pulses palpable. Foot has motion and sensation with no calf tenderness or ulcerations. Brisk monophasic flow in right posterior tibial artery Left leg with 3+ femoral and 2+ popliteal pulse palpable. Left foot free of any ischemic changes. No numbness or tenderness. Brisk dorsalis pedis flow left foot. ABIs last week were 0.34 on the right     Assessment:     remains with stable right calf claudication following showering of peripheral emboli with failed attempt at revascularization to right peroneal artery    Plan:     patient is not a candidate for distal bypass since he has no adequate vein for conduit. He is doing well with ABI of 0.34 and stable claudication symptoms. He was advised to be very cautious about shoes that are not fitting properly and developing blisters or sores on  the right foot since these may not heal. He will return in 6 months with repeat ABIs     He was given note to return to work on Monday, November 16 and given a final prescription for hydrocodone/acetaminophen 5/325.-#30 tablets

## 2013-11-01 ENCOUNTER — Telehealth: Payer: Self-pay | Admitting: *Deleted

## 2013-11-01 NOTE — Telephone Encounter (Signed)
Received phone call from patient regarding last 3 cycles of Xeloda.  Cycle 6 started on 09/07, Completed 09/20.  Cycle 7 started 09/28 Completed 10/11.  Cycle 8 started 10/19 Completes on 11/1.  Informed Cody Suarez. Cody Moores, NP. Informed patient that once he finishes with Cycle 8 on 11/1, he is done. Patient verbalized understanding.

## 2013-11-06 ENCOUNTER — Telehealth: Payer: Self-pay | Admitting: *Deleted

## 2013-11-06 NOTE — Telephone Encounter (Signed)
Reports a "sore" on the ball of his right foot that has become tender. Size of an eraser and looks like a blister-skin is not broken. Has been putting neosporin ointment on it. Reports he still has one dose of Xeloda to take this evening. Instructed him to stop the Xeloda. May soak his foot couple times day and use moisturizer. Cover area with bandaid to protect it. Wear loose fitting shoes and limit walking on this foot. Call if he develops an open sore or develops any sign of infection.

## 2013-11-07 ENCOUNTER — Other Ambulatory Visit (HOSPITAL_COMMUNITY): Payer: BC Managed Care – PPO

## 2013-11-07 ENCOUNTER — Encounter (HOSPITAL_COMMUNITY): Payer: BC Managed Care – PPO

## 2013-11-07 ENCOUNTER — Ambulatory Visit: Payer: BC Managed Care – PPO | Admitting: Vascular Surgery

## 2013-11-13 ENCOUNTER — Encounter: Payer: Self-pay | Admitting: Vascular Surgery

## 2013-11-13 ENCOUNTER — Telehealth: Payer: Self-pay | Admitting: *Deleted

## 2013-11-13 NOTE — Telephone Encounter (Signed)
Earlier note reviewed with Dr. Benay Spice. Returned call to pt, instructed him to call for worsening pain/ redness or other signs of infection. He voiced understanding.

## 2013-11-13 NOTE — Telephone Encounter (Signed)
Message from pt reporting his R foot has a "split" that is bleeding and painful. Returned call, he reports he noticed dried blood near big toe, scratched it and it began to bleed. Area is very painful. Last dose of Xeloda 11/2. Instructed him to keep area clean. Apply thin film of antibiotic ointment and leave uncovered. He voiced understanding. Will review with Dr. Benay Spice.

## 2013-11-14 ENCOUNTER — Ambulatory Visit: Payer: BC Managed Care – PPO | Admitting: Family

## 2013-11-14 ENCOUNTER — Encounter: Payer: Self-pay | Admitting: Vascular Surgery

## 2013-11-14 ENCOUNTER — Other Ambulatory Visit: Payer: Self-pay | Admitting: Vascular Surgery

## 2013-11-14 ENCOUNTER — Ambulatory Visit (INDEPENDENT_AMBULATORY_CARE_PROVIDER_SITE_OTHER)
Admission: RE | Admit: 2013-11-14 | Discharge: 2013-11-14 | Disposition: A | Discharge status: Home / Self Care | Payer: BC Managed Care – PPO | Source: Ambulatory Visit | Attending: Vascular Surgery | Admitting: Vascular Surgery

## 2013-11-14 ENCOUNTER — Ambulatory Visit (HOSPITAL_COMMUNITY)
Admission: RE | Admit: 2013-11-14 | Discharge: 2013-11-14 | Disposition: A | Discharge status: Home / Self Care | Payer: BC Managed Care – PPO | Source: Ambulatory Visit | Attending: Vascular Surgery | Admitting: Vascular Surgery

## 2013-11-14 ENCOUNTER — Ambulatory Visit (INDEPENDENT_AMBULATORY_CARE_PROVIDER_SITE_OTHER): Payer: BC Managed Care – PPO | Admitting: Vascular Surgery

## 2013-11-14 ENCOUNTER — Other Ambulatory Visit: Payer: Self-pay

## 2013-11-14 VITALS — BP 170/89 | HR 80 | Ht 70.8 in | Wt 168.0 lb

## 2013-11-14 DIAGNOSIS — Z01810 Encounter for preprocedural cardiovascular examination: Secondary | ICD-10-CM | POA: Diagnosis not present

## 2013-11-14 DIAGNOSIS — I739 Peripheral vascular disease, unspecified: Secondary | ICD-10-CM

## 2013-11-14 DIAGNOSIS — I749 Embolism and thrombosis of unspecified artery: Secondary | ICD-10-CM

## 2013-11-14 DIAGNOSIS — L98499 Non-pressure chronic ulcer of skin of other sites with unspecified severity: Secondary | ICD-10-CM

## 2013-11-14 DIAGNOSIS — I70209 Unspecified atherosclerosis of native arteries of extremities, unspecified extremity: Secondary | ICD-10-CM | POA: Insufficient documentation

## 2013-11-14 NOTE — Progress Notes (Signed)
Subjective:     Patient ID: Cody Suarez, male   DOB: 1945-04-17, 68 y.o.   MRN: [id number]  HPI this 68 year old male returns today with history of some drainage occurring from the plantar aspect of his right foot at the base of the first toe. This has been some slight bloody drainage and a blistered area which has occurred. He did not traumatize the area. He has had some increasing discomfort at night right foot. He is able to ambulate still. He has a known occluded right popliteal to peroneal bypass.  Past Medical History  Diagnosis Date  . Diarrhea   . Hypertension   . Ulcerative colitis     history  . Anemia of chronic disease 2015    "related to cancer tx" (08/09/2013)  . Arthritis     "thumb joints" (08/09/2013)  . Kidney stones X 2    "passed them both"  . Colon cancer 03/2013    History  Substance Use Topics  . Smoking status: Never Smoker   . Smokeless tobacco: Never Used  . Alcohol Use: 1.8 oz/week    3 Glasses of wine per week    Family History  Problem Relation Age of Onset  . Heart disease Mother     No Known Allergies  Current outpatient prescriptions: acetaminophen (TYLENOL) 500 MG tablet, Take 1,000 mg by mouth 3 (three) times daily as needed (pain)., Disp: , Rfl: ;  Budesonide (UCERIS) 9 MG TB24, Take 9 mg by mouth daily. , Disp: , Rfl:  capecitabine (XELODA) 500 MG tablet, Take 3-4 tablets (1,500-2,000 mg total) by mouth See admin instructions. Take 4 tablets (2000 mg) every morning and 3 tablets (1500 mg) every night for 14 days, hold for 7 days, then repeat, Disp: 98 tablet, Rfl: 0;  Cyanocobalamin (VITAMIN B-12 SL), Place 1 tablet under the tongue 3 (three) times a week. , Disp: , Rfl:  HYDROcodone-acetaminophen (NORCO/VICODIN) 5-325 MG per tablet, Take 1 tablet by mouth daily as needed for moderate pain., Disp: , Rfl: ;  HYDROcodone-acetaminophen (NORCO/VICODIN) 5-325 MG per tablet, Take 1 tablet by mouth 2 (two) times daily as needed for moderate pain., Disp:  30 tablet, Rfl: 0;  hydrocortisone cream 1 %, Apply 1 application topically 2 (two) times daily as needed for itching., Disp: , Rfl:  metroNIDAZOLE (FLAGYL) 500 MG tablet, Take 1 tablet (500 mg total) by mouth 3 (three) times daily., Disp: 30 tablet, Rfl: 0;  rivaroxaban (XARELTO) 20 MG TABS tablet, Take 1 tablet (20 mg total) by mouth daily with breakfast., Disp: 30 tablet, Rfl: 0;  sildenafil (VIAGRA) 50 MG tablet, Take 50 mg by mouth daily as needed for erectile dysfunction., Disp: , Rfl:   BP 170/89 mmHg  Pulse 80  Ht 5' 10.8" (1.798 m)  Wt 168 lb (76.204 kg)  BMI 23.57 kg/m2  SpO2 99%  Body mass index is 23.57 kg/(m^2).          Review of SystemsDenies chest pain, dyspnea on exertion, PND, orthopnea, hemoptysis. Does have history of colon cancer and currently has been on chemotherapy until 2 weeks ago. Patient was also on anticoagulation since vascular surgery-Xeralto . Other systems negative and a complete review of systems     Objective:   Physical Exam BP 170/89 mmHg  Pulse 80  Ht 5' 10.8" (1.798 m)  Wt 168 lb (76.204 kg)  BMI 23.57 kg/m2  SpO2 99%  Gen.-alert and oriented x3 in no apparent distress HEENT normal for age Lungs no rhonchi or  wheezing Cardiovascular regular rhythm no murmurs carotid pulses 3+ palpable no bruits audible Abdomen soft nontender no palpable masses Musculoskeletal free of  major deformities Skin clear -no rashes Neurologic normal Lower extremities 3+ femoral pulses bilaterally. No popliteal pulses palpable on the right. There is small fissure base of right first toe with small blister on plantar aspect. No cellulitis or drainage noted currently. Left foot free of ulceration.  Today I performed an independent SonoSite ultrasound exam of the right and left upper extremities and then ordered a right upper extremity vein mapping and duplex scan of right lower extremity. This revealed that the right superficial femoral artery is patent and free of  disease down to just below the knee joint. It also revealed that there is excellent cephalic vein patent from the wrist to the shoulder with the minimum size of 0.25 cm.       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 Risks and benefits thoroughly discussed with patient is as well as unsuccessful bypass and need for amputation of right leg. He would like to proceed with an attempted this. He will discontinue his anticoagulation after Wednesday of this week.

## 2013-11-14 NOTE — Pre-Procedure Instructions (Signed)
Cody Suarez  11/14/2013   Your procedure is scheduled on:  Friday, November 13.  Report to [facility] Admitting at 5:30 AM.  Call this number if you have problems the morning of surgery: [phone]   Remember:   Do not eat food or drink liquids after midnight Thursday, November 12.   Take these medicines the morning of surgery with A SIP OF WATER: Pain medication if needed.               'Do not wear jewelry, make-up or nail polish.  Do not wear lotions, powders, or perfumes.    Men may shave face and neck.  Do not bring valuables to the hospital.              [facility] is not responsible  for any belongings or valuables.               Contacts, dentures or bridgework may not be worn into surgery.  Leave suitcase in the car. After surgery it may be brought to your room.  For patients admitted to the hospital, discharge time is determined by your treatment team.               Patients discharged the day of surgery will not be allowed to drive home.  Name and phone number of your driver: -   Special Instructions: Review  [facility] [facility] - Preparing For Surgery.   Please read over the following fact sheets that you were given: Pain Booklet, Coughing and Deep Breathing, Blood Transfusion Information and Surgical Site Infection Prevention

## 2013-11-15 ENCOUNTER — Encounter (HOSPITAL_COMMUNITY)
Admission: RE | Admit: 2013-11-15 | Discharge: 2013-11-15 | Disposition: A | Discharge status: Home / Self Care | Payer: BC Managed Care – PPO | Source: Ambulatory Visit | Attending: Vascular Surgery | Admitting: Vascular Surgery

## 2013-11-15 ENCOUNTER — Encounter (HOSPITAL_COMMUNITY): Payer: Self-pay

## 2013-11-15 LAB — URINALYSIS, ROUTINE W REFLEX MICROSCOPIC
BILIRUBIN URINE: NEGATIVE
GLUCOSE, UA: NEGATIVE mg/dL
Hgb urine dipstick: NEGATIVE
KETONES UR: NEGATIVE mg/dL
LEUKOCYTES UA: NEGATIVE
Nitrite: NEGATIVE
PH: 6 (ref 5.0–8.0)
Protein, ur: NEGATIVE mg/dL
Specific Gravity, Urine: 1.014 (ref 1.005–1.030)
Urobilinogen, UA: 0.2 mg/dL (ref 0.0–1.0)

## 2013-11-15 LAB — COMPREHENSIVE METABOLIC PANEL
ALT: 21 U/L (ref 0–53)
AST: 20 U/L (ref 0–37)
Albumin: 3.5 g/dL (ref 3.5–5.2)
Alkaline Phosphatase: 65 U/L (ref 39–117)
Anion gap: 14 (ref 5–15)
BUN: 15 mg/dL (ref 6–23)
CALCIUM: 9.2 mg/dL (ref 8.4–10.5)
CO2: 23 mEq/L (ref 19–32)
Chloride: 102 mEq/L (ref 96–112)
Creatinine, Ser: 0.91 mg/dL (ref 0.50–1.35)
GFR, EST NON AFRICAN AMERICAN: 85 mL/min — AB (ref >90)
GLUCOSE: 95 mg/dL (ref 70–99)
Potassium: 3.9 mEq/L (ref 3.7–5.3)
Sodium: 139 mEq/L (ref 137–147)
Total Bilirubin: 0.5 mg/dL (ref 0.3–1.2)
Total Protein: 6.8 g/dL (ref 6.0–8.3)

## 2013-11-15 LAB — CBC
HCT: 45.8 % (ref 39.0–52.0)
HEMOGLOBIN: 15.2 g/dL (ref 13.0–17.0)
MCH: 33.9 pg (ref 26.0–34.0)
MCHC: 33.2 g/dL (ref 30.0–36.0)
MCV: 102.2 fL — AB (ref 78.0–100.0)
Platelets: 267 10*3/uL (ref 150–400)
RBC: 4.48 MIL/uL (ref 4.22–5.81)
RDW: 20.9 % — ABNORMAL HIGH (ref 11.5–15.5)
WBC: 7 10*3/uL (ref 4.0–10.5)

## 2013-11-15 LAB — APTT: aPTT: 24 seconds (ref 24–37)

## 2013-11-15 LAB — TYPE AND SCREEN
ABO/RH(D): B NEG
Antibody Screen: NEGATIVE

## 2013-11-15 LAB — PROTIME-INR
INR: 1.13 (ref 0.00–1.49)
Prothrombin Time: 14.7 seconds (ref 11.6–15.2)

## 2013-11-15 LAB — SURGICAL PCR SCREEN
MRSA, PCR: NEGATIVE
Staphylococcus aureus: NEGATIVE

## 2013-11-15 MED ORDER — CHLORHEXIDINE GLUCONATE CLOTH 2 % EX PADS: 6.0000 | MEDICATED_PAD | Freq: Once | CUTANEOUS | Status: DC

## 2013-11-16 MED ORDER — DEXTROSE 5 % IV SOLN
1.5000 g | INTRAVENOUS | Status: AC
  Administered 2013-11-17: 1.5 g via INTRAVENOUS
  Filled 2013-11-16: qty 1.5

## 2013-11-17 ENCOUNTER — Inpatient Hospital Stay (HOSPITAL_COMMUNITY): Payer: BC Managed Care – PPO | Admitting: Anesthesiology

## 2013-11-17 ENCOUNTER — Encounter (HOSPITAL_COMMUNITY)
Admission: RE | Disposition: A | Discharge status: Home / Self Care | Payer: Self-pay | Source: Ambulatory Visit | Attending: Vascular Surgery

## 2013-11-17 ENCOUNTER — Encounter (HOSPITAL_COMMUNITY): Payer: Self-pay

## 2013-11-17 ENCOUNTER — Inpatient Hospital Stay (HOSPITAL_COMMUNITY): Payer: BC Managed Care – PPO

## 2013-11-17 ENCOUNTER — Inpatient Hospital Stay (HOSPITAL_COMMUNITY)
Admission: RE | Admit: 2013-11-17 | Discharge: 2013-11-20 | DRG: 253 | Disposition: A | Discharge status: Home / Self Care | Payer: BC Managed Care – PPO | Source: Ambulatory Visit | Attending: Vascular Surgery | Admitting: Vascular Surgery

## 2013-11-17 DIAGNOSIS — Z8249 Family history of ischemic heart disease and other diseases of the circulatory system: Secondary | ICD-10-CM | POA: Diagnosis not present

## 2013-11-17 DIAGNOSIS — Z419 Encounter for procedure for purposes other than remedying health state, unspecified: Secondary | ICD-10-CM

## 2013-11-17 DIAGNOSIS — I10 Essential (primary) hypertension: Secondary | ICD-10-CM | POA: Diagnosis present

## 2013-11-17 DIAGNOSIS — Y832 Surgical operation with anastomosis, bypass or graft as the cause of abnormal reaction of the patient, or of later complication, without mention of misadventure at the time of the procedure: Secondary | ICD-10-CM | POA: Diagnosis present

## 2013-11-17 DIAGNOSIS — I743 Embolism and thrombosis of arteries of the lower extremities: Secondary | ICD-10-CM | POA: Diagnosis present

## 2013-11-17 DIAGNOSIS — D630 Anemia in neoplastic disease: Secondary | ICD-10-CM | POA: Diagnosis present

## 2013-11-17 DIAGNOSIS — I70235 Atherosclerosis of native arteries of right leg with ulceration of other part of foot: Secondary | ICD-10-CM | POA: Diagnosis present

## 2013-11-17 DIAGNOSIS — L97519 Non-pressure chronic ulcer of other part of right foot with unspecified severity: Secondary | ICD-10-CM | POA: Diagnosis present

## 2013-11-17 DIAGNOSIS — Z7901 Long term (current) use of anticoagulants: Secondary | ICD-10-CM | POA: Diagnosis not present

## 2013-11-17 DIAGNOSIS — C189 Malignant neoplasm of colon, unspecified: Secondary | ICD-10-CM | POA: Diagnosis present

## 2013-11-17 DIAGNOSIS — T82898A Other specified complication of vascular prosthetic devices, implants and grafts, initial encounter: Secondary | ICD-10-CM | POA: Diagnosis present

## 2013-11-17 DIAGNOSIS — I998 Other disorder of circulatory system: Secondary | ICD-10-CM | POA: Diagnosis present

## 2013-11-17 DIAGNOSIS — I749 Embolism and thrombosis of unspecified artery: Secondary | ICD-10-CM

## 2013-11-17 DIAGNOSIS — I739 Peripheral vascular disease, unspecified: Secondary | ICD-10-CM

## 2013-11-17 HISTORY — PX: VEIN HARVEST: SHX6363

## 2013-11-17 HISTORY — PX: INTRAOPERATIVE ARTERIOGRAM: SHX5157

## 2013-11-17 HISTORY — PX: FEMORAL-TIBIAL BYPASS GRAFT: SHX938

## 2013-11-17 LAB — CBC
HCT: 40.4 % (ref 39.0–52.0)
HEMOGLOBIN: 13.8 g/dL (ref 13.0–17.0)
MCH: 33.9 pg (ref 26.0–34.0)
MCHC: 34.2 g/dL (ref 30.0–36.0)
MCV: 99.3 fL (ref 78.0–100.0)
Platelets: 205 10*3/uL (ref 150–400)
RBC: 4.07 MIL/uL — ABNORMAL LOW (ref 4.22–5.81)
RDW: 20.3 % — ABNORMAL HIGH (ref 11.5–15.5)
WBC: 11.7 10*3/uL — ABNORMAL HIGH (ref 4.0–10.5)

## 2013-11-17 LAB — HEPARIN LEVEL (UNFRACTIONATED): Heparin Unfractionated: <0.1 IU/mL — ABNORMAL LOW (ref 0.30–0.70)

## 2013-11-17 LAB — APTT: APTT: 37 s (ref 24–37)

## 2013-11-17 SURGERY — CREATION, BYPASS, ARTERIAL, FEMORAL TO TIBIAL, USING GRAFT
Anesthesia: General | Site: Leg Lower | Laterality: Right

## 2013-11-17 MED ORDER — POTASSIUM CHLORIDE IN NACL 20-0.9 MEQ/L-% IV SOLN
INTRAVENOUS | Status: DC
  Administered 2013-11-17: 18:00:00 via INTRAVENOUS
  Filled 2013-11-17 (×5): qty 1000

## 2013-11-17 MED ORDER — DEXAMETHASONE SODIUM PHOSPHATE 4 MG/ML IJ SOLN
INTRAMUSCULAR | Status: DC | PRN
  Administered 2013-11-17: 6 mg via INTRAVENOUS

## 2013-11-17 MED ORDER — ACETAMINOPHEN 500 MG PO TABS: 1000.0000 mg | ORAL_TABLET | Freq: Three times a day (TID) | ORAL | Status: DC | PRN

## 2013-11-17 MED ORDER — 0.9 % SODIUM CHLORIDE (POUR BTL) OPTIME
TOPICAL | Status: DC | PRN
  Administered 2013-11-17: 2000 mL

## 2013-11-17 MED ORDER — HYDROCODONE-ACETAMINOPHEN 5-325 MG PO TABS
1.0000 | ORAL_TABLET | Freq: Two times a day (BID) | ORAL | Status: DC | PRN
  Administered 2013-11-18 – 2013-11-19 (×3): 1 via ORAL
  Filled 2013-11-17 (×4): qty 1

## 2013-11-17 MED ORDER — ONDANSETRON HCL 4 MG/2ML IJ SOLN
INTRAMUSCULAR | Status: DC | PRN
  Administered 2013-11-17 (×2): 4 mg via INTRAVENOUS

## 2013-11-17 MED ORDER — GLYCOPYRROLATE 0.2 MG/ML IJ SOLN
INTRAMUSCULAR | Status: DC | PRN
  Administered 2013-11-17: 0.6 mg via INTRAVENOUS

## 2013-11-17 MED ORDER — HYDROMORPHONE HCL 1 MG/ML IJ SOLN
0.2500 mg | INTRAMUSCULAR | Status: DC | PRN
  Administered 2013-11-17 (×2): 0.5 mg via INTRAVENOUS

## 2013-11-17 MED ORDER — LIDOCAINE HCL (CARDIAC) 20 MG/ML IV SOLN
INTRAVENOUS | Status: AC
  Filled 2013-11-17: qty 5

## 2013-11-17 MED ORDER — ROCURONIUM BROMIDE 100 MG/10ML IV SOLN
INTRAVENOUS | Status: DC | PRN
  Administered 2013-11-17: 50 mg via INTRAVENOUS

## 2013-11-17 MED ORDER — LACTATED RINGERS IV SOLN
INTRAVENOUS | Status: DC | PRN
  Administered 2013-11-17 (×3): via INTRAVENOUS

## 2013-11-17 MED ORDER — MIDAZOLAM HCL 5 MG/5ML IJ SOLN
INTRAMUSCULAR | Status: DC | PRN
  Administered 2013-11-17: 2 mg via INTRAVENOUS

## 2013-11-17 MED ORDER — MIDAZOLAM HCL 2 MG/2ML IJ SOLN
INTRAMUSCULAR | Status: AC
  Filled 2013-11-17: qty 2

## 2013-11-17 MED ORDER — BUDESONIDE 3 MG PO CP24
9.0000 mg | ORAL_CAPSULE | Freq: Every day | ORAL | Status: DC
  Administered 2013-11-19: 9 mg via ORAL
  Filled 2013-11-17 (×4): qty 3

## 2013-11-17 MED ORDER — DEXTROSE 5 % IV SOLN
INTRAVENOUS | Status: DC | PRN
  Administered 2013-11-17: 08:00:00 via INTRAVENOUS

## 2013-11-17 MED ORDER — SODIUM CHLORIDE 0.9 % IV SOLN: INTRAVENOUS | Status: DC

## 2013-11-17 MED ORDER — HEPARIN SODIUM (PORCINE) 1000 UNIT/ML IJ SOLN
INTRAMUSCULAR | Status: AC
  Filled 2013-11-17: qty 1

## 2013-11-17 MED ORDER — MORPHINE SULFATE 2 MG/ML IJ SOLN
2.0000 mg | INTRAMUSCULAR | Status: DC | PRN
  Administered 2013-11-17 – 2013-11-19 (×7): 2 mg via INTRAVENOUS
  Filled 2013-11-17 (×7): qty 1

## 2013-11-17 MED ORDER — HYDROMORPHONE HCL 1 MG/ML IJ SOLN
INTRAMUSCULAR | Status: AC
  Filled 2013-11-17: qty 1

## 2013-11-17 MED ORDER — ARTIFICIAL TEARS OP OINT
TOPICAL_OINTMENT | OPHTHALMIC | Status: DC | PRN
  Administered 2013-11-17: 1 via OPHTHALMIC

## 2013-11-17 MED ORDER — IOHEXOL 300 MG/ML  SOLN
INTRAMUSCULAR | Status: DC | PRN
  Administered 2013-11-17: 20 mL via INTRAVENOUS

## 2013-11-17 MED ORDER — BUDESONIDE 9 MG PO TB24: 9.0000 mg | ORAL_TABLET | Freq: Every day | ORAL | Status: DC

## 2013-11-17 MED ORDER — GUAIFENESIN-DM 100-10 MG/5ML PO SYRP: 15.0000 mL | ORAL_SOLUTION | ORAL | Status: DC | PRN

## 2013-11-17 MED ORDER — EPHEDRINE SULFATE 50 MG/ML IJ SOLN
INTRAMUSCULAR | Status: DC | PRN
  Administered 2013-11-17: 5 mg via INTRAVENOUS

## 2013-11-17 MED ORDER — SUCCINYLCHOLINE CHLORIDE 20 MG/ML IJ SOLN
INTRAMUSCULAR | Status: AC
  Filled 2013-11-17: qty 1

## 2013-11-17 MED ORDER — BISACODYL 10 MG RE SUPP: 10.0000 mg | Freq: Every day | RECTAL | Status: DC | PRN

## 2013-11-17 MED ORDER — FENTANYL CITRATE 0.05 MG/ML IJ SOLN
INTRAMUSCULAR | Status: AC
  Filled 2013-11-17: qty 5

## 2013-11-17 MED ORDER — POTASSIUM CHLORIDE CRYS ER 20 MEQ PO TBCR: 20.0000 meq | EXTENDED_RELEASE_TABLET | Freq: Every day | ORAL | Status: DC | PRN

## 2013-11-17 MED ORDER — HYDRALAZINE HCL 20 MG/ML IJ SOLN: 5.0000 mg | INTRAMUSCULAR | Status: DC | PRN

## 2013-11-17 MED ORDER — PANTOPRAZOLE SODIUM 40 MG PO TBEC
40.0000 mg | DELAYED_RELEASE_TABLET | Freq: Every day | ORAL | Status: DC
  Filled 2013-11-17 (×2): qty 1

## 2013-11-17 MED ORDER — HYDROMORPHONE HCL 1 MG/ML IJ SOLN
INTRAMUSCULAR | Status: AC
  Administered 2013-11-17: 0.5 mg via INTRAVENOUS
  Filled 2013-11-17: qty 1

## 2013-11-17 MED ORDER — ONDANSETRON HCL 4 MG/2ML IJ SOLN
4.0000 mg | Freq: Four times a day (QID) | INTRAMUSCULAR | Status: DC | PRN
Start: 2013-11-17 — End: 2013-11-20

## 2013-11-17 MED ORDER — SODIUM CHLORIDE 0.9 % IR SOLN
Status: DC | PRN
  Administered 2013-11-17: 09:00:00

## 2013-11-17 MED ORDER — SODIUM CHLORIDE 0.9 % IV SOLN
10.0000 mg | INTRAVENOUS | Status: DC | PRN
  Administered 2013-11-17: 15 ug/min via INTRAVENOUS

## 2013-11-17 MED ORDER — ARTIFICIAL TEARS OP OINT
TOPICAL_OINTMENT | OPHTHALMIC | Status: AC
  Filled 2013-11-17: qty 3.5

## 2013-11-17 MED ORDER — DOCUSATE SODIUM 100 MG PO CAPS
100.0000 mg | ORAL_CAPSULE | Freq: Every day | ORAL | Status: DC
  Filled 2013-11-17 (×2): qty 1

## 2013-11-17 MED ORDER — SODIUM CHLORIDE 0.9 % IV SOLN
500.0000 mL | Freq: Once | INTRAVENOUS | Status: AC | PRN
Start: 2013-11-17 — End: 2013-11-17

## 2013-11-17 MED ORDER — DEXTROSE 5 % IV SOLN
1.5000 g | Freq: Two times a day (BID) | INTRAVENOUS | Status: AC
  Administered 2013-11-17 – 2013-11-18 (×2): 1.5 g via INTRAVENOUS
  Filled 2013-11-17 (×2): qty 1.5

## 2013-11-17 MED ORDER — PHENOL 1.4 % MT LIQD: 1.0000 | OROMUCOSAL | Status: DC | PRN

## 2013-11-17 MED ORDER — HYDROCORTISONE 1 % EX CREA: 1.0000 "application " | TOPICAL_CREAM | Freq: Two times a day (BID) | CUTANEOUS | Status: DC | PRN

## 2013-11-17 MED ORDER — ONDANSETRON HCL 4 MG/2ML IJ SOLN: 4.0000 mg | Freq: Four times a day (QID) | INTRAMUSCULAR | Status: DC | PRN

## 2013-11-17 MED ORDER — HEPARIN SODIUM (PORCINE) 1000 UNIT/ML IJ SOLN
INTRAMUSCULAR | Status: DC | PRN
  Administered 2013-11-17: 6000 [IU] via INTRAVENOUS

## 2013-11-17 MED ORDER — VECURONIUM BROMIDE 10 MG IV SOLR
INTRAVENOUS | Status: DC | PRN
  Administered 2013-11-17: 2 mg via INTRAVENOUS
  Administered 2013-11-17: 4 mg via INTRAVENOUS

## 2013-11-17 MED ORDER — OXYCODONE HCL 5 MG/5ML PO SOLN: 5.0000 mg | Freq: Once | ORAL | Status: DC | PRN

## 2013-11-17 MED ORDER — PROPOFOL 10 MG/ML IV BOLUS
INTRAVENOUS | Status: DC | PRN
  Administered 2013-11-17: 20 mg via INTRAVENOUS
  Administered 2013-11-17: 130 mg via INTRAVENOUS

## 2013-11-17 MED ORDER — LABETALOL HCL 5 MG/ML IV SOLN
10.0000 mg | INTRAVENOUS | Status: DC | PRN
  Filled 2013-11-17: qty 4

## 2013-11-17 MED ORDER — NEOSTIGMINE METHYLSULFATE 10 MG/10ML IV SOLN
INTRAVENOUS | Status: DC | PRN
  Administered 2013-11-17: 5 mg via INTRAVENOUS

## 2013-11-17 MED ORDER — FENTANYL CITRATE 0.05 MG/ML IJ SOLN
INTRAMUSCULAR | Status: DC | PRN
  Administered 2013-11-17: 50 ug via INTRAVENOUS
  Administered 2013-11-17: 150 ug via INTRAVENOUS
  Administered 2013-11-17 (×6): 50 ug via INTRAVENOUS

## 2013-11-17 MED ORDER — LIDOCAINE HCL (CARDIAC) 20 MG/ML IV SOLN
INTRAVENOUS | Status: DC | PRN
  Administered 2013-11-17: 80 mg via INTRAVENOUS

## 2013-11-17 MED ORDER — HEPARIN (PORCINE) IN NACL 100-0.45 UNIT/ML-% IJ SOLN
1000.0000 [IU]/h | INTRAMUSCULAR | Status: DC
  Administered 2013-11-17: 600 [IU]/h via INTRAVENOUS
  Administered 2013-11-18: 800 [IU]/h via INTRAVENOUS
  Administered 2013-11-19: 1000 [IU]/h via INTRAVENOUS
  Filled 2013-11-17 (×4): qty 250

## 2013-11-17 MED ORDER — GLYCOPYRROLATE 0.2 MG/ML IJ SOLN
INTRAMUSCULAR | Status: AC
  Filled 2013-11-17: qty 5

## 2013-11-17 MED ORDER — ALUM & MAG HYDROXIDE-SIMETH 200-200-20 MG/5ML PO SUSP: 15.0000 mL | ORAL | Status: DC | PRN

## 2013-11-17 MED ORDER — OXYCODONE HCL 5 MG PO TABS: 5.0000 mg | ORAL_TABLET | Freq: Once | ORAL | Status: DC | PRN

## 2013-11-17 MED ORDER — METOPROLOL TARTRATE 1 MG/ML IV SOLN: 2.0000 mg | INTRAVENOUS | Status: DC | PRN

## 2013-11-17 SURGICAL SUPPLY — 61 items
BANDAGE ELASTIC 4 VELCRO ST LF (GAUZE/BANDAGES/DRESSINGS) ×8 IMPLANT
BANDAGE ESMARK 6X9 LF (GAUZE/BANDAGES/DRESSINGS) IMPLANT
BNDG ESMARK 6X9 LF (GAUZE/BANDAGES/DRESSINGS)
BNDG GAUZE ELAST 4 BULKY (GAUZE/BANDAGES/DRESSINGS) ×8 IMPLANT
BOOT SUTURE AID YELLOW STND (SUTURE) IMPLANT
CANISTER SUCTION 2500CC (MISCELLANEOUS) ×4 IMPLANT
CLIP TI MEDIUM 24 (CLIP) ×4 IMPLANT
CLIP TI WIDE RED SMALL 24 (CLIP) ×8 IMPLANT
CUFF TOURNIQUET SINGLE 24IN (TOURNIQUET CUFF) IMPLANT
CUFF TOURNIQUET SINGLE 34IN LL (TOURNIQUET CUFF) IMPLANT
DERMABOND ADVANCED (GAUZE/BANDAGES/DRESSINGS) ×6
DERMABOND ADVANCED .7 DNX12 (GAUZE/BANDAGES/DRESSINGS) ×6 IMPLANT
DRAIN SNY 10X20 3/4 PERF (WOUND CARE) IMPLANT
DRAPE INCISE IOBAN 66X45 STRL (DRAPES) ×4 IMPLANT
DRAPE ORTHO SPLIT 77X108 STRL (DRAPES) ×4
DRAPE PROXIMA HALF (DRAPES) ×8 IMPLANT
DRAPE SURG ORHT 6 SPLT 77X108 (DRAPES) ×4 IMPLANT
DRAPE X-RAY CASS 24X20 (DRAPES) ×4 IMPLANT
ELECT CAUTERY BLADE 6.4 (BLADE) ×12 IMPLANT
ELECT REM PT RETURN 9FT ADLT (ELECTROSURGICAL) ×4
ELECTRODE REM PT RTRN 9FT ADLT (ELECTROSURGICAL) ×2 IMPLANT
EVACUATOR SILICONE 100CC (DRAIN) IMPLANT
GAUZE SPONGE 4X4 12PLY STRL (GAUZE/BANDAGES/DRESSINGS) ×4 IMPLANT
GLOVE BIO SURGEON STRL SZ 6.5 (GLOVE) ×15 IMPLANT
GLOVE BIO SURGEONS STRL SZ 6.5 (GLOVE) ×5
GLOVE BIOGEL PI IND STRL 6.5 (GLOVE) ×14 IMPLANT
GLOVE BIOGEL PI IND STRL 7.0 (GLOVE) ×2 IMPLANT
GLOVE BIOGEL PI INDICATOR 6.5 (GLOVE) ×14
GLOVE BIOGEL PI INDICATOR 7.0 (GLOVE) ×2
GLOVE SS BIOGEL STRL SZ 7 (GLOVE) ×4 IMPLANT
GLOVE SUPERSENSE BIOGEL SZ 7 (GLOVE) ×4
GOWN STRL REUS W/ TWL LRG LVL3 (GOWN DISPOSABLE) ×12 IMPLANT
GOWN STRL REUS W/TWL LRG LVL3 (GOWN DISPOSABLE) ×12
INSERT FOGARTY SM (MISCELLANEOUS) ×4 IMPLANT
KIT BASIN OR (CUSTOM PROCEDURE TRAY) ×4 IMPLANT
KIT ROOM TURNOVER OR (KITS) ×4 IMPLANT
NS IRRIG 1000ML POUR BTL (IV SOLUTION) ×8 IMPLANT
PACK PERIPHERAL VASCULAR (CUSTOM PROCEDURE TRAY) ×4 IMPLANT
PAD ARMBOARD 7.5X6 YLW CONV (MISCELLANEOUS) ×8 IMPLANT
PADDING CAST COTTON 6X4 STRL (CAST SUPPLIES) IMPLANT
PENCIL BUTTON HOLSTER BLD 10FT (ELECTRODE) ×8 IMPLANT
SET COLLECT BLD 21X3/4 12 (NEEDLE) ×4 IMPLANT
STOPCOCK 4 WAY LG BORE MALE ST (IV SETS) ×4 IMPLANT
SUT PROLENE 5 0 C 1 24 (SUTURE) ×4 IMPLANT
SUT PROLENE 6 0 BV (SUTURE) ×4 IMPLANT
SUT PROLENE 6 0 CC (SUTURE) ×4 IMPLANT
SUT PROLENE 7 0 BV 1 (SUTURE) ×8 IMPLANT
SUT PROLENE 7 0 BV1 MDA (SUTURE) IMPLANT
SUT SILK 2 0 SH (SUTURE) ×4 IMPLANT
SUT SILK 3 0 (SUTURE) ×2
SUT SILK 3-0 18XBRD TIE 12 (SUTURE) ×2 IMPLANT
SUT SILK 4 0 (SUTURE) ×2
SUT SILK 4-0 18XBRD TIE 12 (SUTURE) ×2 IMPLANT
SUT VIC AB 2-0 CTX 36 (SUTURE) ×8 IMPLANT
SUT VIC AB 3-0 SH 27 (SUTURE) ×20
SUT VIC AB 3-0 SH 27X BRD (SUTURE) ×20 IMPLANT
TOWEL OR 17X24 6PK STRL BLUE (TOWEL DISPOSABLE) ×8 IMPLANT
TRAY FOLEY CATH 16FRSI W/METER (SET/KITS/TRAYS/PACK) ×4 IMPLANT
TUBING EXTENTION W/L.L. (IV SETS) ×4 IMPLANT
UNDERPAD 30X30 INCONTINENT (UNDERPADS AND DIAPERS) ×4 IMPLANT
WATER STERILE IRR 1000ML POUR (IV SOLUTION) ×4 IMPLANT

## 2013-11-17 NOTE — Anesthesia Preprocedure Evaluation (Signed)
Anesthesia Evaluation  Patient identified by MRN, date of birth, ID band Patient awake    Reviewed: Allergy & Precautions, H&P , NPO status , Patient's Chart, lab work & pertinent test results  Airway Mallampati: II   Neck ROM: full    Dental   Pulmonary neg pulmonary ROS,          Cardiovascular hypertension, + Peripheral Vascular Disease     Neuro/Psych    GI/Hepatic Ulcerative colitis   Endo/Other    Renal/GU      Musculoskeletal  (+) Arthritis -,   Abdominal   Peds  Hematology  (+) anemia ,   Anesthesia Other Findings   Reproductive/Obstetrics                             Anesthesia Physical Anesthesia Plan  ASA: II  Anesthesia Plan: General   Post-op Pain Management:    Induction: Intravenous  Airway Management Planned: Oral ETT  Additional Equipment:   Intra-op Plan:   Post-operative Plan: Extubation in OR  Informed Consent: I have reviewed the patients History and Physical, chart, labs and discussed the procedure including the risks, benefits and alternatives for the proposed anesthesia with the patient or authorized representative who has indicated his/her understanding and acceptance.     Plan Discussed with: CRNA, Anesthesiologist and Surgeon  Anesthesia Plan Comments:         Anesthesia Quick Evaluation

## 2013-11-17 NOTE — Interval H&P Note (Signed)
History and Physical Interval Note:  11/17/2013 7:35 AM  Cody Suarez  has presented today for surgery, with the diagnosis of Ischemic right foot M62.89  The various methods of treatment have been discussed with the patient and family. After consideration of risks, benefits and other options for treatment, the patient has consented to  Procedure(s): BYPASS GRAFT FEMORAL-TIBIAL ARTERY (Right) RIGHT ARM VEIN HARVEST (Right) as a surgical intervention .  The patient's history has been reviewed, patient examined, no change in status, stable for surgery.  I have reviewed the patient's chart and labs.  Questions were answered to the patient's satisfaction.     Tinnie Gens

## 2013-11-17 NOTE — Anesthesia Procedure Notes (Signed)
Procedure Name: Intubation Date/Time: 11/17/2013 7:47 AM Performed by: Jacquiline Doe A Pre-anesthesia Checklist: Patient identified, Timeout performed, Emergency Drugs available, Suction available and Patient being monitored Patient Re-evaluated:Patient Re-evaluated prior to inductionOxygen Delivery Method: Circle system utilized Preoxygenation: Pre-oxygenation with 100% oxygen Intubation Type: IV induction and Cricoid Pressure applied Ventilation: Mask ventilation without difficulty Laryngoscope Size: Mac and 4 Grade View: Grade I Tube type: Oral Tube size: 8.0 mm Number of attempts: 1 Airway Equipment and Method: Stylet Placement Confirmation: ETT inserted through vocal cords under direct vision,  breath sounds checked- equal and bilateral and positive ETCO2 Secured at: 23 cm Tube secured with: Tape Dental Injury: Teeth and Oropharynx as per pre-operative assessment

## 2013-11-17 NOTE — Transfer of Care (Signed)
Immediate Anesthesia Transfer of Care Note  Patient: Cody Suarez  Procedure(s) Performed: Procedure(s): BYPASS GRAFT RIGHT ABOVE KNEE POPLITEAL TO POSTERIOR TIBIAL ARTERY USING RIGHT NON-REVERSED CEPHALIC VEIN (Right) HARVEST OF RIGHT UPPER EXTREMITY CEPHALIC VEIN (Right) INTRA OPERATIVE ARTERIOGRAM (Right)  Patient Location: PACU  Anesthesia Type:General  Level of Consciousness: oriented, sedated, patient cooperative and responds to stimulation  Airway & Oxygen Therapy: Patient Spontanous Breathing and Patient connected to face mask oxygen  Post-op Assessment: Report given to PACU RN, Post -op Vital signs reviewed and stable, Patient moving all extremities and Patient moving all extremities X 4  Post vital signs: Reviewed and stable  Complications: No apparent anesthesia complications

## 2013-11-17 NOTE — OR Nursing (Signed)
Heparin gtt initiated per MD order in pacu at 1224 and infusing at 600 units per hour.

## 2013-11-17 NOTE — Anesthesia Postprocedure Evaluation (Signed)
Anesthesia Post Note  Patient: Cody Suarez  Procedure(s) Performed: Procedure(s) (LRB): BYPASS GRAFT RIGHT ABOVE KNEE POPLITEAL TO POSTERIOR TIBIAL ARTERY USING RIGHT NON-REVERSED CEPHALIC VEIN (Right) HARVEST OF RIGHT UPPER EXTREMITY CEPHALIC VEIN (Right) INTRA OPERATIVE ARTERIOGRAM (Right)  Anesthesia type: General  Patient location: PACU  Post pain: Pain level controlled and Adequate analgesia  Post assessment: Post-op Vital signs reviewed, Patient's Cardiovascular Status Stable, Respiratory Function Stable, Patent Airway and Pain level controlled  Last Vitals:  Filed Vitals:   11/17/13 1627  BP: 161/89  Pulse: 98  Temp:   Resp: 13    Post vital signs: Reviewed and stable  Level of consciousness: awake, alert  and oriented  Complications: No apparent anesthesia complications

## 2013-11-17 NOTE — Op Note (Signed)
OPERATIVE REPORT  Date of Surgery: 11/17/2013  Surgeon: Tinnie Gens, MD  Assistant: Silva Bandy PA  Pre-op Diagnosis: ischemic ulcer right foot, secondary to previous emboli, severe tibial disease   Post-op Diagnosis: ischemic ulcer right foot secondary to previous emboli, severe tibial disease   Procedure: Procedure(s): BYPASS GRAFT RIGHT ABOVE KNEE POPLITEAL TO POSTERIOR TIBIAL ARTERY USING RIGHT NON-REVERSED CEPHALIC VEIN HARVEST OF RIGHT UPPER EXTREMITY CEPHALIC VEIN INTRA OPERATIVE ARTERIOGRAM  Anesthesia: General  EBL:200 cc  The patient was taken to the operating room placed in supine position at which time satisfactory general endotracheal anesthesia was minister. Cephalic vein in the right upper extremity was then marked using the SonoSite ultrasound. It was marked from the wrist to the shoulder level. After prepping and draping the right upper extremity and right lower extremity in routine sterile manner the cephalic vein was then harvested from the right upper extremity beginning at the wrist extending up to the deltopectoral groove. This was done through multiple small incisions. Branches ligated with 4-0 silk ties and divided it was removed gently valley with heparinized saline marked for orientation purposes. It was satisfactory but did have some sclerotic changes in the forearm segment but was of adequate caliber. Engine turned to the right lower extremity where the popliteal artery was exposed in the above-knee position through medial incision. It was a relatively normal-appearing vessel with an excellent pulse. Second incision was made just proximal to the medial malleolus and the posterior tibial artery was exposed. There was a small vessel slightly thickened but no calcific plaque was palpable. Third medial incision was made below the knee in the popliteal space was entered from below, created in the anatomic position., Was then passed from the distal incision up to the  below-knee incision and subcutaneous position on the medial calf area. Patient was then heparinized. Popliteal artery above the knee was occluded proximally and distally with clamps open 15 blade extended with Potts scissors. Cephalic vein was using non-reversed fashion. It was spatulated and anastomosed inside with 6-0 Prolene. Clamps were released and there was an excellent pulse down to the first set of competent valves. Using the retrograde valvulotome the valves were rendered incompetent with resultant excellent flow out of the distal end of the vein graft. The vein was then carefully delivered through the tunnel behind the knee and using the tunneler in the subcutaneous position down to the distal wound. Posterior tibial artery was occluded with Vesseloops proximally and distally open 15 blade extended with the Potts scissors. It was a very small  Vessel with no atherosclerosis but it was thickened. It would accept a 2 dilator distally but the vessel did not seem to be normal while passing the dilator. There was some backbleeding however. Vein graft was spatulated and anastomosed end-to-side with 6-0 Prolene. Vessel loops were released and there was excellent pulse in the vein graft. There was Doppler flow in the posterior tibial artery both distally and proximally but it was not normal. Intraoperative arteriogram was then performed which revealed the vein graft to have some irregularity in its lower third but adequate caliber. The posterior tibial artery however looked very small did not fill very well distally or proximally. It was known that this was the only runoff vessel available from previous angiograms. Following this adequate hemostasis was achieved and the wounds were all closed in layers of Vicryl subcuticular fashion with Dermabond patient taken to recovery in stable condition. He had an excellent palpable pulse in the vein graft and  audible Doppler flow in posterior tibial artery distally.  Prognosis was guarded regarding patency of this bypass. No other options are available however since this is the only vessel suitable for graft. Patient was taken to recovery room in stable condition.  Complications: None  Procedure Details:   Tinnie Gens, MD 11/17/2013 1:08 PM

## 2013-11-17 NOTE — H&P (View-Only) (Signed)
Subjective:     Patient ID: Cody Suarez, male   DOB: [birth year], 68 y.o.   MRN: [id number]  HPI this 68 year old male returns today with history of some drainage occurring from the plantar aspect of his right foot at the base of the first toe. This has been some slight bloody drainage and a blistered area which has occurred. He did not traumatize the area. He has had some increasing discomfort at night right foot. He is able to ambulate still. He has a known occluded right popliteal to peroneal bypass.  Past Medical History  Diagnosis Date  . Diarrhea   . Hypertension   . Ulcerative colitis     history  . Anemia of chronic disease 2015    "related to cancer tx" (08/09/2013)  . Arthritis     "thumb joints" (08/09/2013)  . Kidney stones X 2    "passed them both"  . Colon cancer 03/2013    History  Substance Use Topics  . Smoking status: Never Smoker   . Smokeless tobacco: Never Used  . Alcohol Use: 1.8 oz/week    3 Glasses of wine per week    Family History  Problem Relation Age of Onset  . Heart disease Mother     No Known Allergies  Current outpatient prescriptions: acetaminophen (TYLENOL) 500 MG tablet, Take 1,000 mg by mouth 3 (three) times daily as needed (pain)., Disp: , Rfl: ;  Budesonide (UCERIS) 9 MG TB24, Take 9 mg by mouth daily. , Disp: , Rfl:  capecitabine (XELODA) 500 MG tablet, Take 3-4 tablets (1,500-2,000 mg total) by mouth See admin instructions. Take 4 tablets (2000 mg) every morning and 3 tablets (1500 mg) every night for 14 days, hold for 7 days, then repeat, Disp: 98 tablet, Rfl: 0;  Cyanocobalamin (VITAMIN B-12 SL), Place 1 tablet under the tongue 3 (three) times a week. , Disp: , Rfl:  HYDROcodone-acetaminophen (NORCO/VICODIN) 5-325 MG per tablet, Take 1 tablet by mouth daily as needed for moderate pain., Disp: , Rfl: ;  HYDROcodone-acetaminophen (NORCO/VICODIN) 5-325 MG per tablet, Take 1 tablet by mouth 2 (two) times daily as needed for moderate pain., Disp:  30 tablet, Rfl: 0;  hydrocortisone cream 1 %, Apply 1 application topically 2 (two) times daily as needed for itching., Disp: , Rfl:  metroNIDAZOLE (FLAGYL) 500 MG tablet, Take 1 tablet (500 mg total) by mouth 3 (three) times daily., Disp: 30 tablet, Rfl: 0;  rivaroxaban (XARELTO) 20 MG TABS tablet, Take 1 tablet (20 mg total) by mouth daily with breakfast., Disp: 30 tablet, Rfl: 0;  sildenafil (VIAGRA) 50 MG tablet, Take 50 mg by mouth daily as needed for erectile dysfunction., Disp: , Rfl:   BP 170/89 mmHg  Pulse 80  Ht 5' 10.8" (1.798 m)  Wt 168 lb (76.204 kg)  BMI 23.57 kg/m2  SpO2 99%  Body mass index is 23.57 kg/(m^2).          Review of SystemsDenies chest pain, dyspnea on exertion, PND, orthopnea, hemoptysis. Does have history of colon cancer and currently has been on chemotherapy until 2 weeks ago. Patient was also on anticoagulation since vascular surgery-Xeralto . Other systems negative and a complete review of systems     Objective:   Physical Exam BP 170/89 mmHg  Pulse 80  Ht 5' 10.8" (1.798 m)  Wt 168 lb (76.204 kg)  BMI 23.57 kg/m2  SpO2 99%  Gen.-alert and oriented x3 in no apparent distress HEENT normal for age Lungs no rhonchi or  wheezing Cardiovascular regular rhythm no murmurs carotid pulses 3+ palpable no bruits audible Abdomen soft nontender no palpable masses Musculoskeletal free of  major deformities Skin clear -no rashes Neurologic normal Lower extremities 3+ femoral pulses bilaterally. No popliteal pulses palpable on the right. There is small fissure base of right first toe with small blister on plantar aspect. No cellulitis or drainage noted currently. Left foot free of ulceration.  Today I performed an independent SonoSite ultrasound exam of the right and left upper extremities and then ordered a right upper extremity vein mapping and duplex scan of right lower extremity. This revealed that the right superficial femoral artery is patent and free of  disease down to just below the knee joint. It also revealed that there is excellent cephalic vein patent from the wrist to the shoulder with the minimum size of 0.25 cm.       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 Risks and benefits thoroughly discussed with patient is as well as unsuccessful bypass and need for amputation of right leg. He would like to proceed with an attempted this. He will discontinue his anticoagulation after Wednesday of this week.

## 2013-11-18 DIAGNOSIS — I739 Peripheral vascular disease, unspecified: Secondary | ICD-10-CM

## 2013-11-18 LAB — BASIC METABOLIC PANEL
Anion gap: 15 (ref 5–15)
BUN: 15 mg/dL (ref 6–23)
CALCIUM: 8.6 mg/dL (ref 8.4–10.5)
CO2: 19 meq/L (ref 19–32)
Chloride: 102 mEq/L (ref 96–112)
Creatinine, Ser: 0.95 mg/dL (ref 0.50–1.35)
GFR calc Af Amer: >90 mL/min (ref >90)
GFR calc non Af Amer: 84 mL/min — ABNORMAL LOW (ref >90)
GLUCOSE: 140 mg/dL — AB (ref 70–99)
Potassium: 4.8 mEq/L (ref 3.7–5.3)
Sodium: 136 mEq/L — ABNORMAL LOW (ref 137–147)

## 2013-11-18 LAB — CBC
HCT: 36.8 % — ABNORMAL LOW (ref 39.0–52.0)
Hemoglobin: 12.4 g/dL — ABNORMAL LOW (ref 13.0–17.0)
MCH: 34.3 pg — AB (ref 26.0–34.0)
MCHC: 33.7 g/dL (ref 30.0–36.0)
MCV: 101.9 fL — AB (ref 78.0–100.0)
Platelets: 199 10*3/uL (ref 150–400)
RBC: 3.61 MIL/uL — AB (ref 4.22–5.81)
RDW: 20.2 % — ABNORMAL HIGH (ref 11.5–15.5)
WBC: 9.3 10*3/uL (ref 4.0–10.5)

## 2013-11-18 LAB — HEPARIN LEVEL (UNFRACTIONATED): Heparin Unfractionated: <0.1 IU/mL — ABNORMAL LOW (ref 0.30–0.70)

## 2013-11-18 NOTE — Progress Notes (Signed)
[facility] feels better   Objective 131/69 81 97.9 F (36.6 C) (Oral) 12 96%  Intake/Output Summary (Last 24 hours) at 11/18/13 0835 Last data filed at 11/18/13 0616  Gross per 24 hour  Intake   2460 ml  Output   1800 ml  Net    660 ml   Right leg: incisions clean palpable graft pulse, brisk PT doppler Right arm Ace in place no obvious hematoma  Assessment/Planning: Patent PT bypass  Slowly titrate up heparin Transfer 2W  FIELDS,CHARLES E 11/18/2013 8:35 AM --  Laboratory Lab Results:  Recent Labs  11/17/13 1550 11/18/13 0101  WBC 11.7* 9.3  HGB 13.8 12.4*  HCT 40.4 36.8*  PLT 205 199   BMET  Recent Labs  11/15/13 0903 11/18/13 0101  NA 139 136*  K 3.9 4.8  CL 102 102  CO2 23 19  GLUCOSE 95 140*  BUN 15 15  CREATININE 0.91 0.95  CALCIUM 9.2 8.6    COAG Lab Results  Component Value Date   INR 1.13 11/15/2013   INR 1.28 09/26/2013   INR 1.17 08/11/2013   No results found for: PTT

## 2013-11-18 NOTE — Evaluation (Signed)
Physical Therapy Evaluation Patient Details Name: Cody Suarez MRN: [id number] DOB: [birth year] Today's Date: 11/18/2013   History of Present Illness  Pt s/p BPG right above knee popliteal to posterior tibial artery  Clinical Impression  Pt very pleasant resting with RLE propped in flexion on pillows on arrival with education for need to extend knee and work on dorsiflexion. Pt educated for transfers, gait and mobility and will continue to benefit from acute therapy to maximize mobility, function, strength and gait to return pt to PLOF and decrease burden of care.     Follow Up Recommendations Home health PT    Equipment Recommendations  None recommended by PT    Recommendations for Other Services       Precautions / Restrictions Precautions Precautions: Fall      Mobility  Bed Mobility Overal bed mobility: Modified Independent                Transfers Overall transfer level: Needs assistance   Transfers: Sit to/from Stand Sit to Stand: Supervision         General transfer comment: cues for hand placement not to pull on RW  Ambulation/Gait Ambulation/Gait assistance: Min guard Ambulation Distance (Feet): 50 Feet Assistive device: Rolling walker (2 wheeled) Gait Pattern/deviations: Step-to pattern;Decreased stance time - right   Gait velocity interpretation: Below normal speed for age/gender General Gait Details: pt maintaining TDWB on RLE due to pain with cues for sequence, increased heel strike and stance on RLE as well as position in RW  Stairs            Wheelchair Mobility    Modified Rankin (Stroke Patients Only)       Balance Overall balance assessment: Needs assistance   Sitting balance-Leahy Scale: Good       Standing balance-Leahy Scale: Poor                               Pertinent Vitals/Pain Pain Assessment: 0-10 Pain Score: 4  Pain Location: RLE Pain Descriptors / Indicators: Aching Pain Intervention(s):  Premedicated before session;Repositioned    Home Living Family/patient expects to be discharged to:: Private residence Living Arrangements: Spouse/significant other Available Help at Discharge: Family Type of Home: House Home Access: Stairs to enter Entrance Stairs-Rails: Can reach both Entrance Stairs-Number of Steps: 4 Home Layout: One level Home Equipment: Environmental consultant - 2 wheels      Prior Function Level of Independence: Independent               Hand Dominance   Dominant Hand: Right    Extremity/Trunk Assessment   Upper Extremity Assessment: Overall WFL for tasks assessed           Lower Extremity Assessment: RLE deficits/detail RLE Deficits / Details: limited ROM due to postop pain    Cervical / Trunk Assessment: Normal  Communication      Cognition Arousal/Alertness: Awake/alert Behavior During Therapy: WFL for tasks assessed/performed Overall Cognitive Status: Within Functional Limits for tasks assessed                      General Comments      Exercises General Exercises - Lower Extremity Long Arc Quad: AROM;Right;15 reps;Seated Toe Raises: AROM;Seated;Right;15 reps      Assessment/Plan    PT Assessment Patient needs continued PT services  PT Diagnosis Difficulty walking;Acute pain   PT Problem List Decreased strength;Decreased range of motion;Decreased activity tolerance;Decreased balance;Decreased  mobility;Pain;Decreased knowledge of use of DME  PT Treatment Interventions Gait training;Stair training;Functional mobility training;Therapeutic activities;Therapeutic exercise;Patient/family education;DME instruction   PT Goals (Current goals can be found in the Care Plan section) Acute Rehab PT Goals Patient Stated Goal: return to deer and pheasant hunting PT Goal Formulation: With patient Time For Goal Achievement: 11/25/13 Potential to Achieve Goals: Good    Frequency Min 3X/week   Barriers to discharge        Co-evaluation                End of Session   Activity Tolerance: Patient tolerated treatment well Patient left: in chair;with call bell/phone within reach Nurse Communication: Mobility status         Time: 3709-6438 PT Time Calculation (min) (ACUTE ONLY): 31 min   Charges:   PT Evaluation $Initial PT Evaluation Tier I: 1 Procedure PT Treatments $Therapeutic Activity: 8-22 mins   PT G CodesMelford Aase 11/18/2013, 9:42 AM Elwyn Reach, [place]

## 2013-11-18 NOTE — Plan of Care (Signed)
Problem: Consults Goal: Peripheral Bypass Patient Education (See Patient Education module for education specifics.)  Outcome: Progressing Goal: Skin Care Protocol Initiated - if Braden Score 18 or less If consults are not indicated, leave blank or document N/A  Outcome: Not Applicable Date Met:  39/17/92 Goal: Nutrition Consult-if indicated Outcome: Not Applicable Date Met:  17/83/75  Problem: Phase I Progression Outcomes Goal: Graft patent without s/s of occlusion Outcome: Progressing Goal: Tolerates diet without nausea/vomiting Outcome: Completed/Met Date Met:  11/18/13 Goal: If Diabetic, blood sugar < 150 Outcome: Not Applicable Date Met:  42/37/02 Goal: Weaning O2 to maintain Sats > 90% Outcome: Completed/Met Date Met:  11/18/13 Goal: Pain controlled with appropriate interventions Outcome: Progressing Goal: Hemodynamically stable Outcome: Progressing Goal: Initial discharge plan identified Outcome: Progressing  Problem: Phase II Progression Outcomes Goal: Tolerating room air with O2 sats > or equal 90% Outcome: Completed/Met Date Met:  11/18/13 Goal: Graft patent without s/s of occlusion Outcome: Progressing Goal: If Diabetic, blood sugar < 150 Outcome: Not Applicable Date Met:  30/17/20 Goal: Pain controlled Outcome: Progressing Goal: Progress activity as tolerated unless otherwise ordered Outcome: Progressing Goal: Tolerating diet Outcome: Progressing Goal: Discharge plan established Outcome: Progressing Goal: Reinforce elevate foot when in chair Outcome: Progressing  Problem: Phase III Progression Outcomes Goal: Graft patent without s/s of occlusion Outcome: Progressing Goal: Wound healing without s/s of infection Outcome: Progressing Goal: Tolerates increased activity without assistance Outcome: Progressing Goal: If Diabetic, blood sugar < 150 Outcome: Not Applicable Date Met:  91/06/81 Goal: Pain controlled on oral analgesia Outcome: Progressing Goal:  Tolerating diet Outcome: Completed/Met Date Met:  11/18/13 Goal: Discharge plan remains appropriate-arrangements made Outcome: Progressing  Problem: Discharge Progression Outcomes Goal: Graft patent without s/s of occlusion Outcome: Progressing Goal: Wound healing without s/s of infection Outcome: Progressing Goal: Tolerates ADLs/Assistance available to perform ADLs Outcome: Progressing Goal: Barriers To Progression Addressed/Resolved Outcome: Progressing Goal: Discharge plan in place and appropriate Outcome: Progressing Goal: Pain controlled with appropriate interventions Outcome: Progressing Goal: Hemodynamically stable Outcome: Progressing Goal: Tolerating diet Outcome: Completed/Met Date Met:  11/18/13 Goal: Activity appropriate for discharge plan Outcome: Progressing

## 2013-11-18 NOTE — Progress Notes (Addendum)
VASCULAR LAB PRELIMINARY  ARTERIAL  ABI completed:    RIGHT    LEFT    PRESSURE WAVEFORM  PRESSURE WAVEFORM  BRACHIAL bandages  BRACHIAL 150  triphasic  DP   DP    AT 162 monophasic AT 141 Dampened monophasic  PT >176 monophasic PT 64 Dampened monophasic  PER   PER    GREAT TOE  NA GREAT TOE  NA    RIGHT LEFT  ABI Not ascertained because the patient is unable to tolerate the cuff tightening on the right lower extremity, again, to obtain the pressure for the posterior tibial artery. 0.94   Today's study shows an increase in the left ABI post op when compared with study from 09-29-13.  The study from 09-29-13 shows an ABI of 0.49 on the right and 0.47 on the left.  The blood pressures in the right lower extremity have increased since 09-29-13.  Jemila Camille, RVT 11/18/2013, 3:23 PM

## 2013-11-18 NOTE — Progress Notes (Addendum)
PA paged, heparin drip continous and unable to draw lab from either arm. Kathleen Argue S 4:51 PM  Dr. Oneida Alar okay with sticks in right hand. Sherrie Mustache 4:56 PM

## 2013-11-18 NOTE — Plan of Care (Signed)
Problem: Phase I Progression Outcomes Goal: Hemodynamically stable Outcome: Completed/Met Date Met:  11/18/13

## 2013-11-18 NOTE — Plan of Care (Signed)
Problem: Phase I Progression Outcomes Goal: Pain controlled with appropriate interventions Outcome: Completed/Met Date Met:  October 28, 2013

## 2013-11-18 NOTE — Progress Notes (Signed)
Report called to [facility], RN on 2west. VSS, all due meds given, pt's belongings with family at bedside. CCMD and Elink notified. Pt transferring on monitor to 2W07.

## 2013-11-19 NOTE — Progress Notes (Addendum)
Ambulated 175f in hall, HR up to 120. WKathleen ArgueS 12:02 PM

## 2013-11-19 NOTE — Progress Notes (Signed)
Physical Therapy Treatment Patient Details Name: Cody Suarez MRN: [id number] DOB: [birth year] Today's Date: 2013/12/03    History of Present Illness Pt s/p BPG right above knee popliteal to posterior tibial artery    PT Comments    Patient making gains with mobility and gait.  Slow gait pattern requiring increased time for mobility.  Able to get Rt foot flat on floor at this session.  Follow Up Recommendations  Home health PT     Equipment Recommendations  None recommended by PT    Recommendations for Other Services       Precautions / Restrictions Precautions Precautions: Fall Restrictions Weight Bearing Restrictions: No    Mobility  Bed Mobility Overal bed mobility: Modified Independent                Transfers Overall transfer level: Modified independent Equipment used: Rolling walker (2 wheeled) Transfers: Sit to/from Stand Sit to Stand: Modified independent (Device/Increase time)         General transfer comment: Patient using correct technique.  Ambulation/Gait Ambulation/Gait assistance: Supervision Ambulation Distance (Feet): 180 Feet Assistive device: Rolling walker (2 wheeled) Gait Pattern/deviations: Step-to pattern;Decreased stance time - right;Decreased step length - left;Decreased weight shift to right;Antalgic Gait velocity: Decreased Gait velocity interpretation: Below normal speed for age/gender General Gait Details: Verbal cues to try to put Rt foot flat on floor.  Was able to put foot flat with PWB on Rt foot.  Slow steady gait.   Stairs            Wheelchair Mobility    Modified Rankin (Stroke Patients Only)       Balance                                    Cognition Arousal/Alertness: Awake/alert Behavior During Therapy: WFL for tasks assessed/performed Overall Cognitive Status: Within Functional Limits for tasks assessed                      Exercises      General Comments         Pertinent Vitals/Pain Pain Assessment: 0-10 Pain Score: 3  Pain Location: RLE Pain Descriptors / Indicators: Other (Comment) (Stinging and pulling) Pain Intervention(s): Monitored during session;Repositioned    Home Living                      Prior Function            PT Goals (current goals can now be found in the care plan section) Progress towards PT goals: Progressing toward goals    Frequency  Min 3X/week    PT Plan Current plan remains appropriate    Co-evaluation             End of Session   Activity Tolerance: Patient tolerated treatment well Patient left: in bed;with call bell/phone within reach     Time: 1647-1711 PT Time Calculation (min) (ACUTE ONLY): 24 min  Charges:  $Gait Training: 23-37 mins                    G Codes:      Despina Pole 12-03-2013, 6:33 PM Carita Pian. Sanjuana Kava, [place] Pager [phone]

## 2013-11-19 NOTE — Plan of Care (Signed)
Problem: Phase II Progression Outcomes Goal: Pain controlled Outcome: Completed/Met Date Met:  11/19/13     

## 2013-11-19 NOTE — Progress Notes (Signed)
[facility]  Subjective  - Still some soreness  Objective 151/64 71 98.4 F (36.9 C) (Oral) 18 96%  Intake/Output Summary (Last 24 hours) at 11/19/13 4818 Last data filed at 11/19/13 0400  Gross per 24 hour  Intake  221.3 ml  Output   1250 ml  Net -1028.7 ml   Right arm incisions healing, no hematoma Right leg incisions healing, 2+ graft pulse foot warm  Assessment/Planning: Continue to slowly titrate heparin Dr Kellie Simmering to decide on restarting thrombin inhibitor tomorrow Possible d/c tomorrow  Cody Suarez 11/19/2013 9:28 AM --  Laboratory Lab Results:  Recent Labs  11/17/13 1550 11/18/13 0101  WBC 11.7* 9.3  HGB 13.8 12.4*  HCT 40.4 36.8*  PLT 205 199   BMET  Recent Labs  11/18/13 0101  NA 136*  K 4.8  CL 102  CO2 19  GLUCOSE 140*  BUN 15  CREATININE 0.95  CALCIUM 8.6    COAG Lab Results  Component Value Date   INR 1.13 11/15/2013   INR 1.28 09/26/2013   INR 1.17 08/11/2013   No results found for: PTT

## 2013-11-19 NOTE — Plan of Care (Signed)
Problem: Phase II Progression Outcomes Goal: Progress activity as tolerated unless otherwise ordered Outcome: Completed/Met Date Met:  11/19/13 Goal: Tolerating diet Outcome: Completed/Met Date Met:  11/19/13 Goal: Reinforce elevate foot when in chair Outcome: Completed/Met Date Met:  11/19/13

## 2013-11-19 NOTE — Plan of Care (Signed)
Problem: Consults Goal: Peripheral Bypass Patient Education (See Patient Education module for education specifics.)  Outcome: Progressing Goal: Tobacco Cessation referral if indicated Outcome: Not Applicable Date Met:  63/14/97 Goal: Diabetes Guidelines if Diabetic/Glucose > 140 If diabetic or lab glucose is > 140 mg/dl - Initiate Diabetes/Hyperglycemia Guidelines & Document Interventions  Outcome: Not Applicable Date Met:  02/63/78  Problem: Phase I Progression Outcomes Goal: Graft patent without s/s of occlusion Outcome: Progressing Palpable pulses on right side. Goal: Initial discharge plan identified Outcome: Progressing Goal: Other Phase I Outcomes/Goals Outcome: Progressing

## 2013-11-20 ENCOUNTER — Encounter (HOSPITAL_COMMUNITY): Payer: Self-pay | Admitting: Vascular Surgery

## 2013-11-20 ENCOUNTER — Other Ambulatory Visit: Payer: Self-pay | Admitting: *Deleted

## 2013-11-20 DIAGNOSIS — I70229 Atherosclerosis of native arteries of extremities with rest pain, unspecified extremity: Secondary | ICD-10-CM

## 2013-11-20 DIAGNOSIS — Z48812 Encounter for surgical aftercare following surgery on the circulatory system: Secondary | ICD-10-CM

## 2013-11-20 LAB — HEPARIN LEVEL (UNFRACTIONATED): Heparin Unfractionated: 0.31 IU/mL (ref 0.30–0.70)

## 2013-11-20 MED ORDER — HYDROCODONE-ACETAMINOPHEN 5-325 MG PO TABS
1.0000 | ORAL_TABLET | Freq: Two times a day (BID) | ORAL | Status: DC | PRN
Start: 2013-11-20 — End: 2013-12-20

## 2013-11-20 NOTE — Evaluation (Signed)
Occupational Therapy Evaluation and Discharge Summary Patient Details Name: Cody Suarez MRN: [id number] DOB: 1945-03-20 Today's Date: 11/20/2013    History of Present Illness Pt s/p BPG right above knee popliteal to posterior tibial artery   Clinical Impression   Pt admitted with above diagnosis and overall does not have OT needs.  Pt mod I with most adls and PT is seeing pt to address any balance deficits.  Will d/c OT at this time.  All ADL techniques reviewed and pt understands from last surgery.    Follow Up Recommendations  No OT follow up;Supervision - Intermittent    Equipment Recommendations  None recommended by OT    Recommendations for Other Services       Precautions / Restrictions Precautions Precautions: Fall Restrictions Weight Bearing Restrictions: No      Mobility Bed Mobility Overal bed mobility: Modified Independent                Transfers Overall transfer level: Modified independent Equipment used: Rolling walker (2 wheeled) Transfers: Sit to/from Stand Sit to Stand: Modified independent (Device/Increase time)         General transfer comment: Patient using correct technique.    Balance Overall balance assessment: Needs assistance Sitting-balance support: Feet supported Sitting balance-Leahy Scale: Good     Standing balance support: During functional activity;Bilateral upper extremity supported Standing balance-Leahy Scale: Fair Standing balance comment: Pt was able to stand w/o walker for short amounts of time (to pull up pants) but was encouraged at this point to have one hand on walker at all times when doing things in standing . Reviewed reasons to do everything possible to avoid a fall this early in recovery process.                            ADL Overall ADL's : Modified independent                                       General ADL Comments: Pt overall does well with all adls.  Given extra time  pt can perform all adls safely.  Pt has a tub chair at home to put in shower.  Pt reached LEs to donn socks and shoes with extra time.  If in a hurry, his wife can assist when there.  Wife does work full time and pt will be alone.     Vision                     Perception     Praxis      Pertinent Vitals/Pain Pain Assessment: 0-10 Pain Score: 3  Pain Location: RLE Pain Descriptors / Indicators: Tightness Pain Intervention(s): Monitored during session;Repositioned     Hand Dominance Right   Extremity/Trunk Assessment Upper Extremity Assessment Upper Extremity Assessment: Overall WFL for tasks assessed   Lower Extremity Assessment Lower Extremity Assessment: Defer to PT evaluation RLE Deficits / Details: limited ROM due to postop pain   Cervical / Trunk Assessment Cervical / Trunk Assessment: Normal   Communication Communication Communication: HOH   Cognition Arousal/Alertness: Awake/alert Behavior During Therapy: WFL for tasks assessed/performed Overall Cognitive Status: Within Functional Limits for tasks assessed                     General Comments       Exercises  Shoulder Instructions      Home Living Family/patient expects to be discharged to:: Private residence Living Arrangements: Spouse/significant other Available Help at Discharge: Family Type of Home: House Home Access: Stairs to enter Technical brewer of Steps: 4 Entrance Stairs-Rails: Can reach both [place]: One level     Bathroom Shower/Tub: Tub/shower unit Shower/tub characteristics: Curtain Biochemist, clinical: [place]: Environmental consultant - 2 wheels;Shower seat          Prior Functioning/Environment Level of Independence: Independent        Comments: Pt had this surgery once before and is familiar with recovery process.    OT Diagnosis:     OT Problem List:     OT Treatment/Interventions:      OT Goals(Current goals can be found in the  care plan section) Acute Rehab OT Goals Patient Stated Goal: return to deer and pheasant hunting  OT Frequency:     Barriers to D/C:            Co-evaluation              End of Session Equipment Utilized During Treatment: Rolling walker Nurse Communication: Mobility status  Activity Tolerance: Patient tolerated treatment well Patient left: in chair;with call bell/phone within reach   Time: 0919-0928 OT Time Calculation (min): 9 min Charges:  OT General Charges $OT Visit: 1 Procedure OT Evaluation $Initial OT Evaluation Tier I: 1 Procedure G-Codes:    Cody Suarez 26-Nov-2013, 9:36 AM  [phone]

## 2013-11-20 NOTE — Progress Notes (Addendum)
[facility] Progress Note  11/20/2013 7:46 AM 3 Days Post-Op  Subjective:  No complaints today. Has been ambulating in halls with walker. Ready to go home.   Tmax 98.4 BP sys 140s-150s 02 97% RA  Filed Vitals:   11/20/13 0319  BP: 155/74  Pulse: 77  Temp: 98.4 F (36.9 C)  Resp: 20    Physical Exam: Incisions:  Right arm incisions healing well. No hematoma. Right leg incisions clean and intact, no hematoma. Extremities:  Right foot warm. 2+ graft pulse.   CBC    Component Value Date/Time   WBC 9.3 11/18/2013 0101   WBC 7.8 10/31/2013 1129   RBC 3.61* 11/18/2013 0101   RBC 4.22 10/31/2013 1129   HGB 12.4* 11/18/2013 0101   HGB 13.7 10/31/2013 1129   HCT 36.8* 11/18/2013 0101   HCT 42.3 10/31/2013 1129   PLT 199 11/18/2013 0101   PLT 250 10/31/2013 1129   MCV 101.9* 11/18/2013 0101   MCV 100.2* 10/31/2013 1129   MCH 34.3* 11/18/2013 0101   MCH 32.5 10/31/2013 1129   MCHC 33.7 11/18/2013 0101   MCHC 32.4 10/31/2013 1129   RDW 20.2* 11/18/2013 0101   RDW 24.1* 10/31/2013 1129   LYMPHSABS 1.0 10/31/2013 1129   LYMPHSABS 1.1 03/27/2013 0900   MONOABS 0.8 10/31/2013 1129   MONOABS 0.9 03/27/2013 0900   EOSABS 0.0 10/31/2013 1129   EOSABS 0.2 03/27/2013 0900   BASOSABS 0.0 10/31/2013 1129   BASOSABS 0.2* 03/27/2013 0900    BMET    Component Value Date/Time   NA 136* 11/18/2013 0101   NA 141 09/05/2013 1059   K 4.8 11/18/2013 0101   K 3.6 09/05/2013 1059   CL 102 11/18/2013 0101   CO2 19 11/18/2013 0101   CO2 24 09/05/2013 1059   GLUCOSE 140* 11/18/2013 0101   GLUCOSE 111 09/05/2013 1059   BUN 15 11/18/2013 0101   BUN 11.9 09/05/2013 1059   CREATININE 0.95 11/18/2013 0101   CREATININE 1.0 09/05/2013 1059   CALCIUM 8.6 11/18/2013 0101   CALCIUM 9.0 09/05/2013 1059   GFRNONAA 84* 11/18/2013 0101   GFRAA >90 11/18/2013 0101    INR    Component Value Date/Time   INR 1.13 11/15/2013 0903     Intake/Output Summary (Last 24  hours) at 11/20/13 0746 Last data filed at 11/19/13 2054  Gross per 24 hour  Intake    337 ml  Output    400 ml  Net    -63 ml     Assessment:  68 y.o. male is s/p: bypass graft right above knee popliteal to posterior tibial artery using right non-reversed cephalic vein harvest of right upper extremity cephalic vein.    3 Days Post-Op  Plan: -Progressing well. Incisions healing well. Ambulating well with walker. 2+ graft pulse. -Discussed with Dr. Kellie Simmering, will stop heparin and restart previous home dose of xarelto -Discharge home today.  -Follow up in 2 weeks with Dr. Kellie Simmering.     Virgina Jock, PA-C [facility] Office: [phone] Pager: [phone] 11/20/2013 7:46 AM  Patient doing very well All surgical incisions healing nicely 3+ graft pulse palpable and 3+ posterior tibial pulse palpable distal to bypass States that right foot feels much better  DC home today on Xarelto and return in 2 weeks. Will perform duplex scan of bypass and check ABIs. Patient understands that bypass could occluded any time because of poor runoff. If that should occur think he is ready to accept leg  amputation with prosthesis and he will do well with that process

## 2013-11-20 NOTE — Progress Notes (Signed)
Pt discharged home Discharge instructions given & reviewed Eduction discussed  IV dc'd  Tele dc'd  Pt discharged via wheelchair with volunteer services.  All pt belongs at side. Sherrie Mustache 10:33 AM

## 2013-11-20 NOTE — Plan of Care (Signed)
Problem: Acute Rehab PT Goals(only PT should resolve) Goal: Patient Will Transfer Sit To/From Stand Outcome: Completed/Met Date Met:  11/20/13 Goal: Pt Will Go Up/Down Stairs Outcome: Completed/Met Date Met:  11/20/13 Goal: Pt/caregiver will Perform Home Exercise Program Outcome: Completed/Met Date Met:  11/20/13     

## 2013-11-20 NOTE — Progress Notes (Signed)
Per PA KIm, Dr. Kellie Simmering to see pt prior to discharge. Pt refusing any medications since he is to be discharged. Kathleen Argue S 8:06 AM

## 2013-11-20 NOTE — Progress Notes (Signed)
Physical Therapy Treatment Patient Details Name: Cody Suarez MRN: [id number] DOB: 10-13-1945 Today's Date: 11/20/2013    History of Present Illness Pt s/p BPG right above knee popliteal to posterior tibial artery    PT Comments    Patient agreed to practice ascending and descending stairs this morning in preparation for discharge.  Pt reports that he has been keeping fewer pillows underneath his leg while in bed and is working on gaining knee extension.  He is able to put more weight through his RLE today and was able to achieve a flat foot while walking in hallway and on stairs.  Educated pt on correct sequencing for ascent and descent of stairs.  Pt would continue to benefit from skilled therapy to increase strength and ROM of RLE so that he may be fully mobile and return to PLOF.     Follow Up Recommendations  Home health PT     Equipment Recommendations  None recommended by PT    Recommendations for Other Services       Precautions / Restrictions Precautions Precautions: Fall Restrictions Weight Bearing Restrictions: No    Mobility  Bed Mobility Overal bed mobility: Modified Independent             General bed mobility comments: Pt using bed rails to grasp and move from supine to sit  Transfers Overall transfer level: Needs assistance Equipment used: Rolling walker (2 wheeled) Transfers: Sit to/from Stand Sit to Stand: Supervision         General transfer comment: Patient using correct technique.  Ambulation/Gait Ambulation/Gait assistance: Supervision Ambulation Distance (Feet): 300 Feet Assistive device: Rolling walker (2 wheeled) Gait Pattern/deviations: Step-to pattern;Step-through pattern;Decreased stance time - right;Decreased stride length;Antalgic Gait velocity: Decreased Gait velocity interpretation: Below normal speed for age/gender General Gait Details: Verbal cues to acheive step-through pattern, increase dorsiflexion in right foot, and  increase WB in RLE   Stairs Stairs: Yes Stairs assistance: Supervision Stair Management: Two rails Number of Stairs: 3 General stair comments: Educated on appropriate sequencing, pt completed ascent and descent easily  Wheelchair Mobility    Modified Rankin (Stroke Patients Only)       Balance Overall balance assessment: Needs assistance Sitting-balance support: Feet supported Sitting balance-Leahy Scale: Good     Standing balance support: Bilateral upper extremity supported;During functional activity Standing balance-Leahy Scale: Fair                    Cognition Arousal/Alertness: Awake/alert Behavior During Therapy: WFL for tasks assessed/performed Overall Cognitive Status: Within Functional Limits for tasks assessed                      Exercises General Exercises - Lower Extremity Quad Sets: AROM;Seated;Right;20 reps Long Arc Quad: AROM;Right;20 reps;Seated Toe Raises: AROM;Right;20 reps;Seated Other Exercises Other Exercises: Calf stretch; Right; 30 second hold x 2 reps; seated using gait belt    General Comments      Pertinent Vitals/Pain Pain Assessment: 0-10 Pain Score: 3  Pain Location: RLE Pain Descriptors / Indicators: Tightness Pain Intervention(s): Monitored during session    Home Living Family/patient expects to be discharged to:: Private residence Living Arrangements: Spouse/significant other Available Help at Discharge: Family Type of Home: House Home Access: Stairs to enter Entrance Stairs-Rails: Can reach both Home Layout: One level Home Equipment: Environmental consultant - 2 wheels;Shower seat      Prior Function Level of Independence: Independent      Comments: Pt had this surgery once before and  is familiar with recovery process.   PT Goals (current goals can now be found in the care plan section) Acute Rehab PT Goals Patient Stated Goal: return to deer and pheasant hunting Progress towards PT goals: Progressing toward goals     Frequency  Min 3X/week    PT Plan Current plan remains appropriate    Co-evaluation             End of Session Equipment Utilized During Treatment: Gait belt Activity Tolerance: Patient tolerated treatment well Patient left: in bed;with call bell/phone within reach     Time: 0813-0838 PT Time Calculation (min) (ACUTE ONLY): 25 min  Charges:  $Gait Training: 8-22 mins $Therapeutic Exercise: 8-22 mins                    G Codes:      Kenji Mapel SPT 11/20/2013, 11:20 AM

## 2013-11-21 ENCOUNTER — Telehealth: Payer: Self-pay | Admitting: Vascular Surgery

## 2013-11-21 DIAGNOSIS — Z0279 Encounter for issue of other medical certificate: Secondary | ICD-10-CM

## 2013-11-21 NOTE — Telephone Encounter (Addendum)
-----   Message from Mena Goes, RN sent at 11/20/2013  9:21 AM EST ----- Regarding: Schedule   ----- Message -----    From: Alvia Grove, PA-C    Sent: 11/20/2013   7:56 AM      To: [facility] Charge Pool  S/p above knee to posterior tib bypass on 11/17/13  F/u with Dr. Kellie Simmering in in 2 weeks with b/l  ABIs and right lower leg graft duplex  Thanks, Kim  11/21/13: spoke with pt, dpm

## 2013-11-22 NOTE — Discharge Summary (Signed)
[facility] Discharge Summary  Cody Suarez [birth year] 68 y.o. male  [id number]  Admission Date: 11/17/2013  Discharge Date: 11/20/2013  Physician: Tinnie Gens, MD  Admission Diagnosis: Ischemic right foot M62.89   HPI:   This is a 68 y.o. male who presented with some drainage occurring from the plantar aspect of his right foot at the base of the first toe. This has been some slight bloody drainage and a blistered area which has occurred. He did not traumatize the area. He has had some increasing discomfort at night right foot. He is able to ambulate still. He has a known occluded right popliteal to peroneal bypass.  Hospital Course:  The patient was admitted to the hospital and taken to the operating room on 11/17/2013 and underwent: bypass graft right above the knee popliteal to posterior tibial artery using right non-reversed cephalic vein harvest of right upper extremity cephalic vein.   The patient tolerated the procedure well and was transported to the PACU in  stable condition. He was started on a heparin drip post-operatively.  He was doing well on POD 1. His right arm incisions were healing well without hematoma. His right leg incisions were also healing well and he had a palpable graft pulse. He was transferred to the floor. His heparin was slowly titrated up. He continued to do well on POD 2. He was restarted on xarelto on POD 3. His incisions were healing well. He had a 3+ graft pulse and 3+ posterior tibial pulse. He was ambulating well in the halls with a walker. He was discharged home on POD 3 in good condition. He will follow up in 2 weeks with ABIs and a graft duplex scan.    CBC    Component Value Date/Time   WBC 9.3 11/18/2013 0101   WBC 7.8 10/31/2013 1129   RBC 3.61* 11/18/2013 0101   RBC 4.22 10/31/2013 1129   HGB 12.4* 11/18/2013 0101   HGB 13.7 10/31/2013 1129   HCT 36.8* 11/18/2013 0101   HCT 42.3 10/31/2013 1129   PLT 199  11/18/2013 0101   PLT 250 10/31/2013 1129   MCV 101.9* 11/18/2013 0101   MCV 100.2* 10/31/2013 1129   MCH 34.3* 11/18/2013 0101   MCH 32.5 10/31/2013 1129   MCHC 33.7 11/18/2013 0101   MCHC 32.4 10/31/2013 1129   RDW 20.2* 11/18/2013 0101   RDW 24.1* 10/31/2013 1129   LYMPHSABS 1.0 10/31/2013 1129   LYMPHSABS 1.1 03/27/2013 0900   MONOABS 0.8 10/31/2013 1129   MONOABS 0.9 03/27/2013 0900   EOSABS 0.0 10/31/2013 1129   EOSABS 0.2 03/27/2013 0900   BASOSABS 0.0 10/31/2013 1129   BASOSABS 0.2* 03/27/2013 0900    BMET    Component Value Date/Time   NA 136* 11/18/2013 0101   NA 141 09/05/2013 1059   K 4.8 11/18/2013 0101   K 3.6 09/05/2013 1059   CL 102 11/18/2013 0101   CO2 19 11/18/2013 0101   CO2 24 09/05/2013 1059   GLUCOSE 140* 11/18/2013 0101   GLUCOSE 111 09/05/2013 1059   BUN 15 11/18/2013 0101   BUN 11.9 09/05/2013 1059   CREATININE 0.95 11/18/2013 0101   CREATININE 1.0 09/05/2013 1059   CALCIUM 8.6 11/18/2013 0101   CALCIUM 9.0 09/05/2013 1059   GFRNONAA 84* 11/18/2013 0101   GFRAA >90 11/18/2013 0101     Discharge Instructions:   The patient is discharged to home with extensive instructions on wound care and progressive ambulation.  They are instructed  not to drive or perform any heavy lifting until returning to see the physician in his office.  Discharge Instructions    Call MD for:  redness, tenderness, or signs of infection (pain, swelling, bleeding, redness, odor or green/yellow discharge around incision site)    Complete by:  As directed      Call MD for:  severe or increased pain, loss or decreased feeling  in affected limb(s)    Complete by:  As directed      Call MD for:  temperature >100.5    Complete by:  As directed      Driving Restrictions    Complete by:  As directed   No driving for 2 weeks and while on pain medication     Increase activity slowly    Complete by:  As directed   Walk with assistance use walker or cane as needed      Lifting restrictions    Complete by:  As directed   No lifting for 2 weeks     Resume previous diet    Complete by:  As directed      may wash over wound with mild soap and water    Complete by:  As directed            Discharge Diagnosis:  Ischemic right foot M62.89  Secondary Diagnosis: Patient Active Problem List   Diagnosis Date Noted  . 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 Atherosclerotic PVD with ulceration 11/14/2013  . Atherosclerotic PVD with intermittent claudication 10/31/2013  . PVD (peripheral vascular disease) 09/26/2013  . Occlusion of bypass graft 09/26/2013  . Aftercare following surgery of the circulatory system, [place] 08/29/2013  . Penetrating atherosclerotic ulcer of aorta 08/14/2013  . Hypertension   . Arterial thromboembolism 08/09/2013  . Claudication 07/14/2013  . 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 Right inguinal hernia 03/23/2013  . Umbilical hernia 27/25/3664  . Ulcerative colitis, unspecified 03/23/2013   Past Medical History  Diagnosis Date  . Diarrhea   . Hypertension   . Ulcerative colitis     history  . Anemia of chronic disease 2015    "related to cancer tx" (08/09/2013)  . Arthritis     "thumb joints" (08/09/2013)  . Kidney stones X 2    "passed them both"  . Colon cancer 03/2013       Medication List    TAKE these medications        acetaminophen 500 MG tablet  Commonly known as:  TYLENOL  Take 1,000 mg by mouth 3 (three) times daily as needed (pain).     capecitabine 500 MG tablet  Commonly known as:  XELODA  Take 3-4 tablets (1,500-2,000 mg total) by mouth See admin instructions. Take 4 tablets (2000 mg) every morning and 3 tablets (1500 mg) every night for 14 days, hold for 7 days, then repeat     HYDROcodone-acetaminophen 5-325 MG per tablet  Commonly known as:  NORCO/VICODIN  Take 1 tablet by mouth 2 (two) times daily as needed for moderate pain.     hydrocortisone cream 1 %  Apply 1  application topically 2 (two) times daily as needed for itching.     metroNIDAZOLE 500 MG tablet  Commonly known as:  FLAGYL  Take 1 tablet (500 mg total) by mouth 3 (three) times daily.     rivaroxaban 20 MG Tabs tablet  Commonly known as:  XARELTO  Take 1 tablet (20 mg total) by mouth  daily with breakfast.     sildenafil 50 MG tablet  Commonly known as:  VIAGRA  Take 50 mg by mouth daily as needed for erectile dysfunction.     UCERIS 9 MG Tb24  Generic drug:  Budesonide  Take 9 mg by mouth daily.       Norco #30 No Refill  Disposition: Home  Patient's condition: is Good  Follow up: 1. Dr. Kellie Simmering in 2 weeks   Virgina Jock, PA-C [facility] [phone] 11/22/2013  2:45 PM  - For VQI Registry use --- Instructions: Press F2 to tab through selections.  Delete question if not applicable.   Post-op:  Wound infection: No  Graft infection: No  Transfusion: No   New Arrhythmia: No Ipsilateral amputation: No, [ ]  Minor, [ ]  BKA, [ ]  AKA Discharge patency: [ x] Primary, [ ]  Primary assisted, [ ]  Secondary, [ ]  Occluded Patency judged by: [ ]  Dopper only, [x]  Palpable graft pulse, [ ]  Palpable distal pulse, [ ]  ABI inc. > 0.15, [ ]  Duplex Discharge ABI: R patient unable to tolerate cuff tightening to RLE, L 0.94 D/C Ambulatory Status: Ambulatory with Assistance  Complications: MI: No, [ ]  Troponin only, [ ]  EKG or Clinical CHF: No Resp failure:No, [ ]  Pneumonia, [ ]  Ventilator Chg in renal function: No, [ ]  Inc. Cr > 0.5, [ ]  Temp. Dialysis, [ ]  Permanent dialysis Stroke: No, [ ]  Minor, [ ]  Major Return to OR: No  Reason for return to OR: [ ]  Bleeding, [ ]  Infection, [ ]  Thrombosis, [ ]  Revision  Discharge medications: Statin use:  no ASA use:  no Plavix use:  no Beta blocker use: no Coumadin use: no, on xarelto

## 2013-11-24 ENCOUNTER — Telehealth: Payer: Self-pay | Admitting: Oncology

## 2013-11-24 ENCOUNTER — Other Ambulatory Visit (HOSPITAL_BASED_OUTPATIENT_CLINIC_OR_DEPARTMENT_OTHER): Payer: BC Managed Care – PPO

## 2013-11-24 ENCOUNTER — Ambulatory Visit (HOSPITAL_BASED_OUTPATIENT_CLINIC_OR_DEPARTMENT_OTHER): Payer: BC Managed Care – PPO | Admitting: Oncology

## 2013-11-24 ENCOUNTER — Ambulatory Visit: Payer: BC Managed Care – PPO | Admitting: Oncology

## 2013-11-24 VITALS — BP 189/91 | HR 88 | Temp 97.8°F | Resp 20 | Ht 70.0 in | Wt 170.7 lb

## 2013-11-24 DIAGNOSIS — Z800 Family history of malignant neoplasm of digestive organs: Secondary | ICD-10-CM

## 2013-11-24 DIAGNOSIS — C184 Malignant neoplasm of transverse colon: Secondary | ICD-10-CM

## 2013-11-24 DIAGNOSIS — D509 Iron deficiency anemia, unspecified: Secondary | ICD-10-CM

## 2013-11-24 LAB — CBC WITH DIFFERENTIAL/PLATELET
BASO%: 0.5 % (ref 0.0–2.0)
BASOS ABS: 0.1 10*3/uL (ref 0.0–0.1)
EOS ABS: 0.3 10*3/uL (ref 0.0–0.5)
EOS%: 2.7 % (ref 0.0–7.0)
HCT: 39.1 % (ref 38.4–49.9)
HGB: 12.7 g/dL — ABNORMAL LOW (ref 13.0–17.1)
LYMPH%: 15.5 % (ref 14.0–49.0)
MCH: 33 pg (ref 27.2–33.4)
MCHC: 32.5 g/dL (ref 32.0–36.0)
MCV: 101.6 fL — ABNORMAL HIGH (ref 79.3–98.0)
MONO#: 1.1 10*3/uL — AB (ref 0.1–0.9)
MONO%: 10.1 % (ref 0.0–14.0)
NEUT%: 71.2 % (ref 39.0–75.0)
NEUTROS ABS: 8 10*3/uL — AB (ref 1.5–6.5)
Platelets: 281 10*3/uL (ref 140–400)
RBC: 3.85 10*6/uL — ABNORMAL LOW (ref 4.20–5.82)
RDW: 18.5 % — ABNORMAL HIGH (ref 11.0–14.6)
WBC: 11.3 10*3/uL — AB (ref 4.0–10.3)
lymph#: 1.8 10*3/uL (ref 0.9–3.3)

## 2013-11-24 LAB — COMPREHENSIVE METABOLIC PANEL (CC13)
ALT: 18 U/L (ref 0–55)
ANION GAP: 11 meq/L (ref 3–11)
AST: 13 U/L (ref 5–34)
Albumin: 3.3 g/dL — ABNORMAL LOW (ref 3.5–5.0)
Alkaline Phosphatase: 74 U/L (ref 40–150)
BUN: 11.4 mg/dL (ref 7.0–26.0)
CO2: 23 meq/L (ref 22–29)
Calcium: 8.9 mg/dL (ref 8.4–10.4)
Chloride: 107 mEq/L (ref 98–109)
Creatinine: 0.9 mg/dL (ref 0.7–1.3)
GLUCOSE: 117 mg/dL (ref 70–140)
Potassium: 3.5 mEq/L (ref 3.5–5.1)
Sodium: 140 mEq/L (ref 136–145)
TOTAL PROTEIN: 6.7 g/dL (ref 6.4–8.3)
Total Bilirubin: 0.48 mg/dL (ref 0.20–1.20)

## 2013-11-24 NOTE — Progress Notes (Signed)
[facility] OFFICE PROGRESS NOTE   Diagnosis:  Colon cancer  INTERVAL HISTORY:    Cody Suarez  Returns as scheduled. He completed cycle 8 Xeloda beginning 10/23/2013. He reports no mouth sores or diarrhea with the chemotherapy. He feels well.   He developed right foot discomfort and saw Dr. Kellie Simmering. He was admitted 11/14/2013. He was diagnosed with an ischemic ulcer at the base of the right first toe. He underwent a bypass graft from the right popliteal to posterior tibial artery using a cephalic vein from the right upper extremity. He reports improvement in the right foot pain and healing of the ulcerated area.  Objective:  Vital signs in last 24 hours:  Blood pressure 189/91, pulse 88, temperature 97.8 F (36.6 C), temperature source Oral, resp. rate 20, height _0  (1.778 m), weight 170 lb 11.2 oz (77.429 kg), SpO2 100 %.    HEENT:  No thrush or ulcers Lymphatics:  No cervical, supra-clavicular, axillary, or inguinal nodes Resp:  Lungs clear bilaterally Cardio:  Regular rate and rhythm GI:  No hepatomegaly, nontender, no mass. Vascular:  Both feet are cool. Mild edema of the right lower leg. Skin: healing ulceration at the plantar surface of the right foot. Surgical incisions at the right arm and leg appeared to be healing.   Portacath/PICC-without erythema  Lab Results:  Lab Results  Component Value Date   WBC 11.3* 11/24/2013   HGB 12.7* 11/24/2013   HCT 39.1 11/24/2013   MCV 101.6* 11/24/2013   PLT 281 11/24/2013   NEUTROABS 8.0* 11/24/2013     Lab Results  Component Value Date   CEA 4.7 03/27/2013   Medications: I have reviewed the patient's current medications.  Assessment/Plan: 1. 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 Negative for a BRAF mutation  Tumor  invaded through the muscularis propria into pericolonic fatty tissue and involved the attached omentum.   Cycle 1 adjuvant Xeloda 05/28/2013.   Cycle 2 adjuvant Xeloda 06/18/2013.   Cycle 3 adjuvant Xeloda 07/09/2013.   Cycle 4 adjuvant Xeloda 07/30/2013.   Cycle 5 adjuvant Xeloda 08/20/2013.  Cycle 6 adjuvant Xeloda 09/11/2013.   cycle 7 adjuvant Xeloda 10/02/2013   Cycle 8 adjuvant Xeloda 10/23/2013 2. Ulcerative colitis-extensive chronic active ulcerative colitis was noted on the colon resection specimen 03/28/2013. 3. Hypertension.  4. Microcytic anemia-likely iron deficiency, unable to tolerate oral iron. Improved. 5. Possible area of cecal wall thickening noted on abdominal CT 03/20/2013. 6. Family history of colon cancer. 7. Bilateral calf and low anterior leg pain. Bilateral lower extremity venous duplex negative for DVT 07/14/2013. Right ABI with moderate, borderline severe arterial insufficiency; left ABI suggestive of moderate arterial insufficiency. He was referred to vascular. 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 TEE showed thrombus in the descending aorta. He underwent right popliteal to peroneal artery bypass graft, thrombectomy anterior tibialis and attempted thrombectomy tibioperoneal trunk and posterior tibialis on 08/11/2013. Right calf pain 09/26/2013 with findings of occlusion of right popliteal to peroneal bypass status post thrombolysis. Now on anticoagulation with Xarelto.   recurrent pain and ulceration at the right foot, status post a popliteal to posterior tibial artery bypass using a cephalic vein graft on 02/58/5277  Disposition:   Cody Suarez  Is in clinical remission from colon cancer. We will follow up on the CEA from today. He will return for an office  visit and CEA in 4 months. He is followed by Dr. Watt Climes  for management of inflammatory bowel disease and surveillance colonoscopies.  Betsy Coder, MD  11/24/2013    10:45 AM

## 2013-11-24 NOTE — Telephone Encounter (Signed)
Pt confirmed labs/ov per 11/20 POF, gave pt AVS..... KJ

## 2013-11-25 LAB — CEA: CEA: 2.1 ng/mL (ref 0.0–5.0)

## 2013-11-27 ENCOUNTER — Ambulatory Visit: Payer: BC Managed Care – PPO | Admitting: Family

## 2013-11-27 ENCOUNTER — Encounter (HOSPITAL_COMMUNITY): Payer: BC Managed Care – PPO

## 2013-11-27 ENCOUNTER — Ambulatory Visit: Payer: BC Managed Care – PPO | Admitting: Cardiovascular Disease

## 2013-11-27 ENCOUNTER — Ambulatory Visit (INDEPENDENT_AMBULATORY_CARE_PROVIDER_SITE_OTHER)
Admission: RE | Admit: 2013-11-27 | Discharge: 2013-11-27 | Disposition: A | Discharge status: Home / Self Care | Payer: BC Managed Care – PPO | Source: Ambulatory Visit | Attending: Surgery | Admitting: Surgery

## 2013-11-27 ENCOUNTER — Other Ambulatory Visit (HOSPITAL_COMMUNITY): Payer: BC Managed Care – PPO

## 2013-11-27 ENCOUNTER — Ambulatory Visit (HOSPITAL_COMMUNITY)
Admission: RE | Admit: 2013-11-27 | Discharge: 2013-11-27 | Disposition: A | Discharge status: Home / Self Care | Payer: BC Managed Care – PPO | Source: Ambulatory Visit | Attending: Surgery | Admitting: Surgery

## 2013-11-27 DIAGNOSIS — I70229 Atherosclerosis of native arteries of extremities with rest pain, unspecified extremity: Secondary | ICD-10-CM

## 2013-11-27 DIAGNOSIS — I739 Peripheral vascular disease, unspecified: Secondary | ICD-10-CM | POA: Insufficient documentation

## 2013-11-27 DIAGNOSIS — Z48812 Encounter for surgical aftercare following surgery on the circulatory system: Secondary | ICD-10-CM

## 2013-11-27 DIAGNOSIS — Z95828 Presence of other vascular implants and grafts: Secondary | ICD-10-CM | POA: Diagnosis not present

## 2013-11-28 ENCOUNTER — Telehealth: Payer: Self-pay | Admitting: *Deleted

## 2013-11-28 NOTE — Telephone Encounter (Signed)
-----   Message from [name], MD sent at 11/27/2013  8:27 PM EST ----- Please call patient, cea is normal

## 2013-11-28 NOTE — Telephone Encounter (Signed)
Per Dr. Benay Spice; left voice message notified pt that cea is normal; any questions to call office.

## 2013-12-04 ENCOUNTER — Encounter: Payer: Self-pay | Admitting: Vascular Surgery

## 2013-12-05 ENCOUNTER — Encounter: Payer: Self-pay | Admitting: Vascular Surgery

## 2013-12-05 ENCOUNTER — Ambulatory Visit (INDEPENDENT_AMBULATORY_CARE_PROVIDER_SITE_OTHER): Payer: Self-pay | Admitting: Vascular Surgery

## 2013-12-05 VITALS — BP 151/91 | HR 114 | Resp 16 | Ht 70.0 in | Wt 170.0 lb

## 2013-12-05 DIAGNOSIS — Z48812 Encounter for surgical aftercare following surgery on the circulatory system: Secondary | ICD-10-CM

## 2013-12-05 DIAGNOSIS — I739 Peripheral vascular disease, unspecified: Secondary | ICD-10-CM

## 2013-12-05 NOTE — Addendum Note (Signed)
Addended by: Mena Goes on: 12/05/2013 05:26 PM   Modules accepted: Orders

## 2013-12-05 NOTE — Progress Notes (Signed)
Subjective:     Patient ID: Cody Suarez, male   DOB: 1945/02/28, 68 y.o.   MRN: [id number]  HPI  This 68 year old male returns today 3 weeks post right superficial femoral to posterior tibial bypass using cephalic vein graft from right arm for attempted limb salvage. Patient has not experienced any different pain in the right foot since his surgery. Intraoperative arteriogram revealed treatment poor runoff and posterior tibial artery at the time of surgery grafted remained patent while the patient was in  The hospital. He is currently on Xeralto.  Review of Systems     Objective:   Physical Exam BP 151/91 mmHg  Pulse 114  Resp 16  Ht 5' 10"  (1.778 m)  Wt 170 lb (77.111 kg)  BMI 24.39 kg/m2    Gen. Well-developed well-nourished male in no apparent stress alert and oriented 3  right upper extremity with well-healed vein harvesting incisions Right lower extremity with well-healed surgical incisions. No pulse palpable in bypass which is in the subcutaneous position the medial aspect of the right calf. Good motion and sensation right foot no evidence of ischemia or gangrene.   duplex scan of the bypass performed on November 24 revealed graft to be occluded     Assessment:      early failure of an attempt at right superficial femoral to posterior tibial bypass using cephalic vein graft from right arm   patient is tolerating this well at the present time with no evidence of ischemia or nonhealing ulceration     Plan:      patient understands that next step would be right below-knee amputation if he develops ischemic ulcer which will not heal or severe rest pain which is not case the present time We'll see patient back in 2 months with repeat ABIs unless he has problems in the interim

## 2013-12-14 ENCOUNTER — Ambulatory Visit: Payer: BC Managed Care – PPO | Admitting: Cardiovascular Disease

## 2013-12-14 ENCOUNTER — Encounter (HOSPITAL_COMMUNITY): Payer: Self-pay | Admitting: Cardiovascular Disease

## 2013-12-20 ENCOUNTER — Telehealth: Payer: Self-pay

## 2013-12-20 DIAGNOSIS — G8918 Other acute postprocedural pain: Secondary | ICD-10-CM

## 2013-12-20 MED ORDER — HYDROCODONE-ACETAMINOPHEN 5-325 MG PO TABS: 1.0000 | ORAL_TABLET | Freq: Two times a day (BID) | ORAL | Status: DC | PRN

## 2013-12-20 NOTE — Telephone Encounter (Signed)
Phone call from pt.  Stated he was more active over the past weekend, and has noticed "some" swelling in the right foot, as of yesterday, 12/15.  Denies any foot pain, skin changes, redness/ warmth, or open sores.  Stated he still gets pain at intervals in the "calf" region. Denies that there is any swelling in the leg; stated "only in the foot."  Advised to elevate his right lower extremity above level of heart 3-4 times/day for 20-30 minutes, to reduce swelling.  Encouraged to continue to monitor for worsening symptoms, and call to report.  Pt. requested refill on his Hydrocodone/ Acetaminophen.  Stated he only uses it occasionally, and does take Tylenol intermittently for the pain.  Advised will discuss with Dr. Scot Dock and return call.

## 2013-12-20 NOTE — Telephone Encounter (Signed)
Discussed pain medication with Dr. Scot Dock; gave verbal approval to refill pt's Hydrocodone / Acetaminophen, and to have pt. F/u with Dr. Kellie Simmering re: further evaluation of pain.  Call placed to pt; left voice message re: plan to give new Rx for pain medication refill, and to schedule earlier appt. with Dr. Kellie Simmering.

## 2014-01-03 ENCOUNTER — Encounter: Payer: Self-pay | Admitting: *Deleted

## 2014-01-09 ENCOUNTER — Ambulatory Visit (INDEPENDENT_AMBULATORY_CARE_PROVIDER_SITE_OTHER): Payer: 59 | Admitting: Cardiovascular Disease

## 2014-01-09 ENCOUNTER — Encounter: Payer: Self-pay | Admitting: Cardiovascular Disease

## 2014-01-09 VITALS — BP 200/92 | HR 65 | Ht 70.0 in | Wt 183.8 lb

## 2014-01-09 DIAGNOSIS — I10 Essential (primary) hypertension: Secondary | ICD-10-CM

## 2014-01-09 DIAGNOSIS — I739 Peripheral vascular disease, unspecified: Secondary | ICD-10-CM

## 2014-01-09 DIAGNOSIS — I70219 Atherosclerosis of native arteries of extremities with intermittent claudication, unspecified extremity: Secondary | ICD-10-CM

## 2014-01-09 MED ORDER — LOSARTAN POTASSIUM 50 MG PO TABS: 50.0000 mg | ORAL_TABLET | Freq: Every day | ORAL | Status: DC

## 2014-01-09 NOTE — Progress Notes (Signed)
HPI  This is a 69 year old pleasant man who is here today for a follow-up visit regarding  peripheral arterial disease. He has no previous cardiac history and no chronic medical conditions. Specifically, he is not diabetic and does not smoke. He was diagnosed with stage II colon cancer early 2015and underwent partial colectomy in February followed by chemotherapy with Xeloda . He was seen in July 2015 for bilateral calf discomfort which started about 2-3 months prior to evaluation. He underwent lower extremity venous duplex which showed no evidence of DVT. ABI was moderately to severely reduced bilaterally at 0.52 on the right and 0.6 on the left. Angiography was done in August 2015 which showed: 1. No significant aortoiliac disease. 2. 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 One-vessel runoff below the knee bilaterally via the peroneal artery which is occluded proximally but does reconstitute via collaterals. TEE was performed to evaluate source of embolism. It showed normal LV systolic function and wall motion. There was a mobile thrombus in the proximal ascending aorta and top of an ulcerated plaque. CTA showed an ulcerated plaque in the proximal descending aorta with thrombus. She was evaluated by Dr. Trula Slade. Anticoagulation was recommended and he has been on Xarelto since then. He underwent right SFA to posterior tibial bypass by Dr. Kellie Simmering multiple times with poor outcome due to poor runoff. The patient has been doing reasonably well. He reports no rest pain. He is able to walk medium length distance without significant discomfort. He denies any discomfort at all involving the left lower extremity. His blood pressure has been elevated. He reports hypotension when he was on small dose hydrochlorothiazide while he was taking Uceris.    No Known Allergies   Current Outpatient Prescriptions on File Prior to Visit  Medication  Sig Dispense Refill  . acetaminophen (TYLENOL) 500 MG tablet Take 1,000 mg by mouth 3 (three) times daily as needed (pain).    . Budesonide (UCERIS) 9 MG TB24 Take 9 mg by mouth daily.     Marland Kitchen HYDROcodone-acetaminophen (NORCO/VICODIN) 5-325 MG per tablet Take 1 tablet by mouth 2 (two) times daily as needed for moderate pain. (Patient taking differently: Take 1 tablet by mouth 2 (two) times daily as needed for moderate pain (leg pain). ) 30 tablet 0  . rivaroxaban (XARELTO) 20 MG TABS tablet Take 1 tablet (20 mg total) by mouth daily with breakfast. 30 tablet 0  . sildenafil (VIAGRA) 50 MG tablet Take 50 mg by mouth daily as needed for erectile dysfunction.     No current facility-administered medications on file prior to visit.     Past Medical History  Diagnosis Date  . Diarrhea   . Hypertension   . Ulcerative colitis     history  . Anemia of chronic disease 2015    "related to cancer tx" (08/09/2013)  . Arthritis     "thumb joints" (08/09/2013)  . Kidney stones X 2    "passed them both"  . Colon cancer 03/2013  . Peripheral vascular disease      Past Surgical History  Procedure Laterality Date  . Vasectomy    . Laparoscopic right hemi colectomy N/A 03/28/2013    Procedure: LAPAROSCOPIC ASSISTED HEMI COLECTOMY;  Surgeon: Pedro Earls, MD;  Location: [facility] ORS;  Service: General;  Laterality: N/A;  . Colon surgery    . Lower extremity angiogram Bilateral 08/09/2013  . Tonsillectomy  ~ 1950  . Hernia repair  ~  1995; 03/2013    UHR  . Umbilical hernia repair  1995; 03/2013  . Tee without cardioversion N/A 08/10/2013    Procedure: TRANSESOPHAGEAL ECHOCARDIOGRAM (TEE);  Surgeon: Candee Furbish, MD;  Location: [facility] ENDOSCOPY;  Service: Cardiovascular;  Laterality: N/A;  . Femoral-popliteal bypass graft Right 08/11/2013    Procedure:   RIGHT - POPLITEAL TO PERONEAL ARTERY BYPASS GRAFT  WITH NONREVERSED SAPHENOUS VEIN GRAFT,tHROMBECTOMY ANTERIOR TIBIALIS,ATTEMPTED THROMBECTOMY TIBIO-PERONEAL TRUNK AND  POSTERIOR TIBIALIS, INTRAOPERATIVE ARTERIOGRAM.;  Surgeon: Mal Misty, MD;  Location[facility];  Service: Vascular;  Laterality: Right;  . Femoral-tibial bypass graft Right 11/17/2013    Procedure: BYPASS GRAFT RIGHT ABOVE KNEE POPLITEAL TO POSTERIOR TIBIAL ARTERY USING RIGHT NON-REVERSED CEPHALIC VEIN;  Surgeon: Mal Misty, MD;  Location: [facility];  Service: Vascular;  Laterality: Right;  . Vein harvest Right 11/17/2013    Procedure: HARVEST OF RIGHT UPPER EXTREMITY CEPHALIC VEIN;  Surgeon: Mal Misty, MD;  Location: [facility];  Service: Vascular;  Laterality: Right;  . Intraoperative arteriogram Right 11/17/2013    Procedure: INTRA OPERATIVE ARTERIOGRAM;  Surgeon: Mal Misty, MD;  Location: [place] [facility];  Service: Vascular;  Laterality: Right;  . Abdominal aortagram N/A 08/09/2013    Procedure: ABDOMINAL Maxcine Ham;  Surgeon: [name], MD;  Location: [facility] CATH LAB;  Service: Cardiovascular;  Laterality: N/A;     Family History  Problem Relation Age of Onset  . Heart disease Mother      History   Social History  . Marital Status: Married    Spouse Name: N/A    Number of Children: N/A  . Years of Education: N/A   Occupational History  . Not on file.   Social History Main Topics  . Smoking status: Never Smoker   . Smokeless tobacco: Never Used  . Alcohol Use: 1.8 oz/week    3 Glasses of wine per week  . Drug Use: No  . Sexual Activity: Yes   Other Topics Concern  . Not on file   Social History Narrative     ROS A 10 point review of system was performed. It is negative other than that mentioned in the history of present illness.   PHYSICAL EXAM   BP 200/92 mmHg  Pulse 65  Ht 5' 10"  (1.778 m)  Wt 183 lb 12.8 oz (83.371 kg)  BMI 26.37 kg/m2 Constitutional: He is oriented to person, place, and time. He appears well-developed and well-nourished. No distress.  HENT: No nasal discharge.  Head: Normocephalic and atraumatic.  Eyes: Pupils are equal and round.  No  discharge. Neck: Normal range of motion. Neck supple. No JVD present. No thyromegaly present. No carotid bruits Cardiovascular: Normal rate, regular rhythm, normal heart sounds. Exam reveals no gallop and no friction rub. No murmur heard.  Pulmonary/Chest: Effort normal and breath sounds normal. No stridor. No respiratory distress. He has no wheezes. He has no rales. He exhibits no tenderness.  Abdominal: Soft. Bowel sounds are normal. He exhibits no distension. There is no tenderness. There is no rebound and no guarding.  Musculoskeletal: Normal range of motion. He exhibits no edema and no tenderness.  Neurological: He is alert and oriented to person, place, and time. Coordination normal.  Skin: Skin is warm and dry. No rash noted. He is not diaphoretic. No erythema. No pallor.  Psychiatric: He has a normal mood and affect. His behavior is normal. Judgment and thought content normal.  Vascular: Radial pulses are normal bilaterally. Femoral pulses are normal bilaterally. Distal  pulses are not palpable.   EKG: Normal sinus rhythm with no significant ST or T wave changes.   ASSESSMENT AND PLAN

## 2014-01-09 NOTE — Patient Instructions (Signed)
Your physician has recommended you make the following change in your medication:  1. START Losartan 37m take one by mouth daily  Your physician wants you to follow-up in: 3 MONTHS with Dr AFletcher Anon  You will receive a reminder letter in the mail two months in advance. If you don't receive a letter, please call our office to schedule the follow-up appointment.

## 2014-01-09 NOTE — Assessment & Plan Note (Signed)
Blood pressure has been elevated lately. I started him on losartan 50 mg once daily.

## 2014-01-09 NOTE — Assessment & Plan Note (Signed)
No further revascularization options on the right side. He currently has no rest pain and claudication seems to be reasonable. Continue lifelong anticoagulation. I discussed with him the importance of proper feet care.

## 2014-01-11 ENCOUNTER — Telehealth: Payer: Self-pay

## 2014-01-11 NOTE — Telephone Encounter (Signed)
Pt called stating he got a call from Dr. Carlye Grippe office regarding a follow up from colon surgery. Pt states he is unsure of who Dr. Hassell Done is and/or surgery. Called pt back and informed him Dr. Hassell Done performed his Laparoscopic Colectomy in March of 2015, he had also seen him in June for a 6 month follow up. And they were calling for a second 6 month follow up. Pt states he did not remember seeing him, but his Xeloda has affected his memory, and would call Dr. Carlye Grippe office to let them know he did not need another follow up visit. Pt comes in for visit with Ned Card, NP in March. Pt verbalized all understanding and denies any further questions or concerns at this time.

## 2014-01-15 ENCOUNTER — Encounter: Payer: Self-pay | Admitting: Vascular Surgery

## 2014-01-16 ENCOUNTER — Encounter: Payer: Self-pay | Admitting: Vascular Surgery

## 2014-01-16 ENCOUNTER — Ambulatory Visit (INDEPENDENT_AMBULATORY_CARE_PROVIDER_SITE_OTHER): Payer: Self-pay | Admitting: Vascular Surgery

## 2014-01-16 ENCOUNTER — Ambulatory Visit (HOSPITAL_COMMUNITY)
Admission: RE | Admit: 2014-01-16 | Discharge: 2014-01-16 | Disposition: A | Discharge status: Home / Self Care | Payer: 59 | Source: Ambulatory Visit | Attending: Vascular Surgery | Admitting: Vascular Surgery

## 2014-01-16 VITALS — BP 179/94 | HR 96 | Resp 16 | Ht 70.0 in | Wt 189.0 lb

## 2014-01-16 DIAGNOSIS — I739 Peripheral vascular disease, unspecified: Secondary | ICD-10-CM | POA: Insufficient documentation

## 2014-01-16 DIAGNOSIS — Z48812 Encounter for surgical aftercare following surgery on the circulatory system: Secondary | ICD-10-CM | POA: Diagnosis not present

## 2014-01-16 NOTE — Progress Notes (Signed)
Subjective:     Patient ID: Cody Suarez, male   DOB: [birth year], 69 y.o.   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 The last attempt at limb salvage was a right popliteal to posterior tibial bypass using cephalic vein graft from right arm and 11/17/2013. This graft on when lasted a few weeks before occluding. He has done remarkably well since then with no ischemic ulcers or gangrene or drainage in the right foot. He is able to ambulate several blocks he states. He does eventually developed calf claudication the right leg worse than the left. He has no rest pain. He continues on [place] and is followed by Dr. Fletcher Anon   Review of Systems     Objective:   Physical Exam BP 179/94 mmHg  Pulse 96  Resp 16  Ht 5' 10"  (1.778 m)  Wt 189 lb (85.73 kg)  BMI 27.12 kg/m2  Well-developed well-nourished male no apparent stress alert and oriented 3 Lower extremities with 3+ femoral pulses palpable but no popliteal or distal pulses palpable in either leg. Both legs are free of ischemic ulcers, gangrene, or drainage.  Today ABIs were ordered and right side is 0.36 left side is 0.57.     Assessment:     Patient doing amazingly well with previous peripheral emboli to all tibial vessels and occlusion of attempts at vein graft bypass    Plan:     We'll continue to follow him. He was again cautioned about avoiding any minor injury to the right foot since this may not heal. He understands this. He will return in 6 months with repeat ABIs.

## 2014-01-23 ENCOUNTER — Telehealth: Payer: Self-pay | Admitting: Cardiovascular Disease

## 2014-01-23 DIAGNOSIS — I10 Essential (primary) hypertension: Secondary | ICD-10-CM

## 2014-01-23 NOTE — Telephone Encounter (Signed)
Check BMP. Increase Losartan to 100 mg once daily.

## 2014-01-23 NOTE — Telephone Encounter (Signed)
New Msg         Pt would like a call back. No details given.

## 2014-01-23 NOTE — Telephone Encounter (Signed)
I spoke with the pt and he has been monitoring his BP since starting Losartan 64m daily.   1/10  Morning 160/75, Evening 160/72 1/11 145/68, 178/80 1/12 160/75, 180/90 1/13 160/78, 180/80 1/14 155/80, 170/75 1/15 145/75, 140/75 1/16 158/80, 162/80  I will forward these readings to Dr AFletcher Anonto review and make further recommendations.

## 2014-01-23 NOTE — Telephone Encounter (Signed)
I spoke with the pt and he will come into the office tomorrow for a BMP.

## 2014-01-24 ENCOUNTER — Other Ambulatory Visit (INDEPENDENT_AMBULATORY_CARE_PROVIDER_SITE_OTHER): Payer: 59 | Admitting: *Deleted

## 2014-01-24 DIAGNOSIS — I10 Essential (primary) hypertension: Secondary | ICD-10-CM

## 2014-01-24 LAB — BASIC METABOLIC PANEL
BUN: 16 mg/dL (ref 6–23)
CALCIUM: 9.3 mg/dL (ref 8.4–10.5)
CO2: 29 meq/L (ref 19–32)
CREATININE: 1.04 mg/dL (ref 0.40–1.50)
Chloride: 103 mEq/L (ref 96–112)
GFR: 75.24 mL/min (ref >60.00)
Glucose, Bld: 109 mg/dL — ABNORMAL HIGH (ref 70–99)
Potassium: 3.9 mEq/L (ref 3.5–5.1)
Sodium: 138 mEq/L (ref 135–145)

## 2014-01-24 MED ORDER — LOSARTAN POTASSIUM 100 MG PO TABS: 100.0000 mg | ORAL_TABLET | Freq: Every day | ORAL | Status: DC

## 2014-01-24 NOTE — Telephone Encounter (Signed)
BMP within normal. Rx sent to the pharmacy for Losartan 114m daily.  Detailed message left on the pt's voicemail with instructions per the pt's request.

## 2014-02-08 ENCOUNTER — Telehealth: Payer: Self-pay

## 2014-02-08 NOTE — Telephone Encounter (Signed)
Phone call from pt.  Reported he continues to have swelling in the right foot and some swelling up to the calf.  Stated the swelling will go down a lot overnight, but reoccurs after he is up approx. 3-4 hrs.  Stated "I think it is as good as it's going to get."  Stated he is soaking his right foot in Epsom Salts daily, and applying Cetaphil Cream BID, to prevent any cracking or ulcers.  Stated he would like to return to work on 02/19/14, and is questioning if Dr. Kellie Simmering has any further recommendations to help the swelling.  Stated he elevates the leg at intervals.  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  Advised will talk to Dr. Kellie Simmering when he is in the office 02/12/14, and will call pt. with any further recommendations for the swelling, and request to return to work.  Agrees w/ plan.

## 2014-02-13 ENCOUNTER — Encounter (HOSPITAL_COMMUNITY): Payer: BC Managed Care – PPO

## 2014-02-13 ENCOUNTER — Ambulatory Visit: Payer: BC Managed Care – PPO | Admitting: Vascular Surgery

## 2014-02-13 NOTE — Telephone Encounter (Signed)
Discussed pt's symptoms of continued swelling in the right foot with Dr. Kellie Simmering.  Per Dr. Kellie Simmering, compression stockings are not recommended due to the limited flow in his right leg.  The pt. needs to continue to be cautious to avoid any injury to the right leg/ foot.  Stated he could return to work on 02/19/14 without restrictions.  Pt. was advised about Dr. Evelena Leyden recommendations and to continue to protect the right foot/ leg and avoid injury, if possible.  Gave pt. letter to return to work, effective 02/19/14.  Verb. Understanding.

## 2014-02-27 ENCOUNTER — Telehealth: Payer: Self-pay | Admitting: Cardiovascular Disease

## 2014-02-27 DIAGNOSIS — I10 Essential (primary) hypertension: Secondary | ICD-10-CM

## 2014-02-27 NOTE — Telephone Encounter (Signed)
New message      Returning a call to [facility]

## 2014-02-27 NOTE — Telephone Encounter (Signed)
Add hydrochlorothiazide 12.5 mg once daily as long as there are no plans for him to go back on Uceris.

## 2014-02-27 NOTE — Telephone Encounter (Signed)
I spoke with the pt and his average BP in the morning and evening are 140-175/70-80. The pt is taking his Losartan 173m in the morning.  I will forward this information to Dr AFletcher Anonto review and make further recommendations.

## 2014-02-28 MED ORDER — HYDROCHLOROTHIAZIDE 25 MG PO TABS: 12.5000 mg | ORAL_TABLET | Freq: Every day | ORAL | Status: DC

## 2014-02-28 NOTE — Telephone Encounter (Signed)
The pt is currently taking Uceris on a daily basis due to colitis. When prescribing HCTZ through [facility] no interaction came up between these medications.  I also spoke with Elberta Leatherwood Pharm-D and she is not aware of an interaction between these medications. I spoke with the pt and made him aware that Rx was sent to the pharmacy.  The pt will continue to take Losartan also and monitor his BP. The pt will touch base with our office in a few weeks with his BP readings.

## 2014-02-28 NOTE — Telephone Encounter (Signed)
They are not contraindicated together but both can cause hypokalemia. So let's check BMP in 1 week.

## 2014-03-02 NOTE — Telephone Encounter (Signed)
I spoke with the pt and he will have BMP checked on 03/09/14.

## 2014-03-08 ENCOUNTER — Other Ambulatory Visit: Payer: 59

## 2014-03-09 ENCOUNTER — Other Ambulatory Visit (INDEPENDENT_AMBULATORY_CARE_PROVIDER_SITE_OTHER): Payer: 59 | Admitting: *Deleted

## 2014-03-09 DIAGNOSIS — I10 Essential (primary) hypertension: Secondary | ICD-10-CM

## 2014-03-09 LAB — BASIC METABOLIC PANEL
BUN: 18 mg/dL (ref 6–23)
CALCIUM: 8.9 mg/dL (ref 8.4–10.5)
CO2: 29 mEq/L (ref 19–32)
Chloride: 104 mEq/L (ref 96–112)
Creatinine, Ser: 1.67 mg/dL — ABNORMAL HIGH (ref 0.40–1.50)
GFR: 43.54 mL/min — AB (ref >60.00)
GLUCOSE: 99 mg/dL (ref 70–99)
POTASSIUM: 3.3 meq/L — AB (ref 3.5–5.1)
Sodium: 139 mEq/L (ref 135–145)

## 2014-03-15 ENCOUNTER — Telehealth: Payer: Self-pay | Admitting: Cardiovascular Disease

## 2014-03-15 MED ORDER — AMLODIPINE BESYLATE 2.5 MG PO TABS: 2.5000 mg | ORAL_TABLET | Freq: Every day | ORAL | Status: DC

## 2014-03-15 NOTE — Telephone Encounter (Signed)
New Msg        Pt calling states that he is returning call from yesterday.    Please return pt call.

## 2014-03-15 NOTE — Telephone Encounter (Signed)
Notes Recorded by [name], MD on 03/12/2014 at 3:11 PM Creatinine worsened with mild hypokalemia after starting hydrochlorothiazide. Stop hydrochlorothiazide and start amlodipine 2.5 mg once daily.   I spoke with the pt and made him aware of lab results and medication changes. The pt verbalized understanding of instructions and will continue to monitor his BP and send readings into the office for review.

## 2014-03-27 ENCOUNTER — Other Ambulatory Visit: Payer: 59

## 2014-03-27 ENCOUNTER — Telehealth: Payer: Self-pay | Admitting: Oncology

## 2014-03-27 ENCOUNTER — Ambulatory Visit (HOSPITAL_BASED_OUTPATIENT_CLINIC_OR_DEPARTMENT_OTHER): Payer: 59 | Admitting: Nurse Practitioner

## 2014-03-27 VITALS — BP 188/80 | HR 96 | Temp 98.0°F | Resp 18 | Ht 70.0 in | Wt 198.5 lb

## 2014-03-27 DIAGNOSIS — Z800 Family history of malignant neoplasm of digestive organs: Secondary | ICD-10-CM

## 2014-03-27 DIAGNOSIS — C184 Malignant neoplasm of transverse colon: Secondary | ICD-10-CM

## 2014-03-27 DIAGNOSIS — I10 Essential (primary) hypertension: Secondary | ICD-10-CM | POA: Diagnosis not present

## 2014-03-27 DIAGNOSIS — D509 Iron deficiency anemia, unspecified: Secondary | ICD-10-CM

## 2014-03-27 LAB — CEA: CEA: 2.9 ng/mL (ref 0.0–5.0)

## 2014-03-27 NOTE — Telephone Encounter (Signed)
gv and printed appt sched and avs for pt for June.Marland KitchenMarland Kitchen

## 2014-03-27 NOTE — Progress Notes (Signed)
  [facility] OFFICE PROGRESS NOTE   Diagnosis:  Colon cancer  INTERVAL HISTORY:   Cody Suarez returns as scheduled. No change in bowel habits. No bloody or black stools. He denies significant abdominal pain. He has a good appetite. He reports blood pressures have been "running high". Blood pressure medications are being adjusted.  Objective:  Vital signs in last 24 hours:  Blood pressure 188/80, pulse 96, temperature 98 F (36.7 C), temperature source Oral, resp. rate 18, height 5' 10"  (1.778 m), weight 198 lb 8 oz (90.039 kg), SpO2 99 %.    HEENT: No thrush or ulcers. Lymphatics: No palpable cervical, supra clavicular, axillary or inguinal lymph nodes. Resp: Lungs clear bilaterally. Cardio: Regular rate and rhythm. GI: Abdomen soft and nontender. No hepatomegaly. No mass. Vascular: Pitting edema lower legs bilaterally right greater than left.   Lab Results:  Lab Results  Component Value Date   WBC 11.3* 11/24/2013   HGB 12.7* 11/24/2013   HCT 39.1 11/24/2013   MCV 101.6* 11/24/2013   PLT 281 11/24/2013   NEUTROABS 8.0* 11/24/2013    Imaging:  No results found.  Medications: I have reviewed the patient's current medications.  Assessment/Plan: 1. 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 Negative for a BRAF mutation  Tumor invaded through the muscularis propria into pericolonic fatty tissue and involved the attached omentum.   Cycle 1 adjuvant Xeloda 05/28/2013.   Cycle 2 adjuvant Xeloda 06/18/2013.   Cycle 3 adjuvant Xeloda 07/09/2013.   Cycle 4 adjuvant Xeloda 07/30/2013.   Cycle 5 adjuvant Xeloda 08/20/2013.  Cycle 6 adjuvant Xeloda 09/11/2013.  cycle 7 adjuvant Xeloda 10/02/2013  Cycle 8 adjuvant Xeloda 10/23/2013 2. Ulcerative colitis-extensive chronic  active ulcerative colitis was noted on the colon resection specimen 03/28/2013. 3. Hypertension.  4. Microcytic anemia-likely iron deficiency, unable to tolerate oral iron. Improved. 5. Possible area of cecal wall thickening noted on abdominal CT 03/20/2013. 6. Family history of colon cancer (maternal grandfather died in his 15s with colon cancer). 7. Bilateral calf and low anterior leg pain. Bilateral lower extremity venous duplex negative for DVT 07/14/2013. Right ABI with moderate, borderline severe arterial insufficiency; left ABI suggestive of moderate arterial insufficiency. He was referred to vascular. 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 TEE showed thrombus in the descending aorta. He underwent right popliteal to peroneal artery bypass graft, thrombectomy anterior tibialis and attempted thrombectomy tibioperoneal trunk and posterior tibialis on 08/11/2013. Right calf pain 09/26/2013 with findings of occlusion of right popliteal to peroneal bypass status post thrombolysis. Now on anticoagulation with Xarelto.  recurrent pain and ulceration at the right foot, status post a popliteal to posterior tibial artery bypass using a cephalic vein graft on 78/29/5621   Disposition: Cody Suarez remains in clinical remission from colon cancer. We will follow-up on the CEA from today. We referred him for CT scans in approximately 3 months with a follow-up visit a few days later to review the results.   He is scheduled for a colonoscopy in June of this year.   We discussed a referral to the genetics counselor due to his family history. He is not interested in pursuing this at present.  Plan reviewed with Dr. Benay Spice.  Cody Suarez ANP/GNP-BC   03/27/2014  4:04 PM

## 2014-04-10 ENCOUNTER — Encounter: Payer: Self-pay | Admitting: Cardiovascular Disease

## 2014-04-10 ENCOUNTER — Ambulatory Visit (INDEPENDENT_AMBULATORY_CARE_PROVIDER_SITE_OTHER): Payer: 59 | Admitting: Cardiovascular Disease

## 2014-04-10 VITALS — BP 160/90 | HR 90 | Ht 70.0 in | Wt 198.1 lb

## 2014-04-10 DIAGNOSIS — I10 Essential (primary) hypertension: Secondary | ICD-10-CM

## 2014-04-10 DIAGNOSIS — I70219 Atherosclerosis of native arteries of extremities with intermittent claudication, unspecified extremity: Secondary | ICD-10-CM

## 2014-04-10 DIAGNOSIS — I739 Peripheral vascular disease, unspecified: Secondary | ICD-10-CM | POA: Diagnosis not present

## 2014-04-10 MED ORDER — AMLODIPINE BESYLATE 5 MG PO TABS: 5.0000 mg | ORAL_TABLET | Freq: Every day | ORAL | Status: DC

## 2014-04-10 NOTE — Assessment & Plan Note (Signed)
Blood pressure is elevated. I increased the dose of amlodipine to 5 mg once daily. If blood pressure remains elevated, I will plan on adding carvedilol.

## 2014-04-10 NOTE — Assessment & Plan Note (Signed)
He surprisingly doing well with only mild right calf claudication. Recommend lifelong anticoagulation.

## 2014-04-10 NOTE — Patient Instructions (Signed)
Your physician has recommended you make the following change in your medication:  1. INCREASE Amlodipine to 35m take one by mouth daily  Your physician wants you to follow-up in: 6 MONTHS with Dr AFletcher Anon  You will receive a reminder letter in the mail two months in advance. If you don't receive a letter, please call our office to schedule the follow-up appointment.

## 2014-04-10 NOTE — Progress Notes (Signed)
HPI  This is a 69 year old pleasant man who is here today for a follow-up visit regarding  peripheral arterial disease and hypertension.  He was diagnosed with stage II colon cancer early 2015 and underwent partial colectomy in February followed by chemotherapy with Xeloda . He was seen in July 2015 for bilateral calf discomfort which started about 2-3 months prior to evaluation. He underwent lower extremity venous duplex which showed no evidence of DVT. ABI was moderately to severely reduced bilaterally at 0.52 on the right and 0.6 on the left. Angiography was done in August 2015 which showed: 1. No significant aortoiliac disease. 2. 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 One-vessel runoff below the knee bilaterally via the peroneal artery which is occluded proximally but does reconstitute via collaterals. TEE was performed to evaluate source of embolism. It showed normal LV systolic function and wall motion. There was a mobile thrombus in the proximal ascending aorta and top of an ulcerated plaque. CTA showed an ulcerated plaque in the proximal descending aorta with thrombus. She was evaluated by Dr. Trula Slade. Anticoagulation was recommended and he has been on Xarelto since then. He underwent right SFA to posterior tibial bypass by Dr. Kellie Simmering multiple times with poor outcome due to poor runoff. He actually has been doing well and denies any rest pain. He has mild right calf pain if he walks very fast but not with regular activities. Blood pressure has been running high lately. Losartan was added. Hydrochlorothiazide improved his blood pressure but was discontinued due to worsening kidney function. Small dose amlodipine was added. Blood pressure is still not optimally controlled.   No Known Allergies   Current Outpatient Prescriptions on File Prior to Visit  Medication Sig Dispense Refill  . acetaminophen (TYLENOL) 500 MG  tablet Take 1,000 mg by mouth 3 (three) times daily as needed (pain).    Marland Kitchen amLODipine (NORVASC) 2.5 MG tablet Take 1 tablet (2.5 mg total) by mouth daily. 90 tablet 3  . Budesonide (UCERIS) 9 MG TB24 Take 9 mg by mouth daily.     Marland Kitchen losartan (COZAAR) 100 MG tablet Take 1 tablet (100 mg total) by mouth daily. 90 tablet 3  . rivaroxaban (XARELTO) 20 MG TABS tablet Take 1 tablet (20 mg total) by mouth daily with breakfast. 30 tablet 0  . sildenafil (VIAGRA) 50 MG tablet Take 50 mg by mouth daily as needed for erectile dysfunction.     No current facility-administered medications on file prior to visit.     Past Medical History  Diagnosis Date  . Diarrhea   . Hypertension   . Ulcerative colitis     history  . Anemia of chronic disease 2015    "related to cancer tx" (08/09/2013)  . Arthritis     "thumb joints" (08/09/2013)  . Kidney stones X 2    "passed them both"  . Colon cancer 03/2013  . Peripheral vascular disease      Past Surgical History  Procedure Laterality Date  . Vasectomy    . Laparoscopic right hemi colectomy N/A 03/28/2013    Procedure: LAPAROSCOPIC ASSISTED HEMI COLECTOMY;  Surgeon: Pedro Earls, MD;  Location: [facility] ORS;  Service: General;  Laterality: N/A;  . Colon surgery    . Lower extremity angiogram Bilateral 08/09/2013  . Tonsillectomy  ~ 1950  . Hernia repair  ~ 1995; 03/2013    UHR  . Umbilical hernia repair  1995; 03/2013  .  Tee without cardioversion N/A 08/10/2013    Procedure: TRANSESOPHAGEAL ECHOCARDIOGRAM (TEE);  Surgeon: Candee Furbish, MD;  Location: [facility] ENDOSCOPY;  Service: Cardiovascular;  Laterality: N/A;  . Femoral-popliteal bypass graft Right 08/11/2013    Procedure:   RIGHT - POPLITEAL TO PERONEAL ARTERY BYPASS GRAFT  WITH NONREVERSED SAPHENOUS VEIN GRAFT,tHROMBECTOMY ANTERIOR TIBIALIS,ATTEMPTED THROMBECTOMY TIBIO-PERONEAL TRUNK AND POSTERIOR TIBIALIS, INTRAOPERATIVE ARTERIOGRAM.;  Surgeon: Mal Misty, MD;  Location: [facility] OR;  Service: Vascular;  Laterality:  Right;  . Femoral-tibial bypass graft Right 11/17/2013    Procedure: BYPASS GRAFT RIGHT ABOVE KNEE POPLITEAL TO POSTERIOR TIBIAL ARTERY USING RIGHT NON-REVERSED CEPHALIC VEIN;  Surgeon: Mal Misty, MD;  Location: [facility];  Service: Vascular;  Laterality: Right;  . Vein harvest Right 11/17/2013    Procedure: HARVEST OF RIGHT UPPER EXTREMITY CEPHALIC VEIN;  Surgeon: Mal Misty, MD;  Location: [facility];  Service: Vascular;  Laterality: Right;  . Intraoperative arteriogram Right 11/17/2013    Procedure: INTRA OPERATIVE ARTERIOGRAM;  Surgeon: Mal Misty, MD;  Location: [facility];  Service: Vascular;  Laterality: Right;  . Abdominal aortagram N/A 08/09/2013    Procedure: ABDOMINAL Maxcine Ham;  Surgeon: [name], MD;  Location: [facility] CATH LAB;  Service: Cardiovascular;  Laterality: N/A;     Family History  Problem Relation Age of Onset  . Heart disease Mother   . Emphysema Father      History   Social History  . Marital Status: Married    Spouse Name: N/A  . Number of Children: N/A  . Years of Education: N/A   Occupational History  . Not on file.   Social History Main Topics  . Smoking status: Never Smoker   . Smokeless tobacco: Never Used  . Alcohol Use: 1.8 oz/week    3 Glasses of wine per week  . Drug Use: No  . Sexual Activity: Yes   Other Topics Concern  . Not on file   Social History Narrative     ROS A 10 point review of system was performed. It is negative other than that mentioned in the history of present illness.   PHYSICAL EXAM   BP 160/90 mmHg  Pulse 90  Ht 5' 10"  (1.778 m)  Wt 198 lb 1.9 oz (89.867 kg)  BMI 28.43 kg/m2  SpO2 98% Constitutional: He is oriented to person, place, and time. He appears well-developed and well-nourished. No distress.  HENT: No nasal discharge.  Head: Normocephalic and atraumatic.  Eyes: Pupils are equal and round.  No discharge. Neck: Normal range of motion. Neck supple. No JVD present. No thyromegaly present. No  carotid bruits Cardiovascular: Normal rate, regular rhythm, normal heart sounds. Exam reveals no gallop and no friction rub. No murmur heard.  Pulmonary/Chest: Effort normal and breath sounds normal. No stridor. No respiratory distress. He has no wheezes. He has no rales. He exhibits no tenderness.  Abdominal: Soft. Bowel sounds are normal. He exhibits no distension. There is no tenderness. There is no rebound and no guarding.  Musculoskeletal: Normal range of motion. He exhibits no edema and no tenderness.  Neurological: He is alert and oriented to person, place, and time. Coordination normal.  Skin: Skin is warm and dry. No rash noted. He is not diaphoretic. No erythema. No pallor.  Psychiatric: He has a normal mood and affect. His behavior is normal. Judgment and thought content normal.  Vascular: Radial pulses are normal bilaterally. Femoral pulses are normal bilaterally. Distal pulses are not palpable.     ASSESSMENT  AND PLAN

## 2014-04-20 ENCOUNTER — Telehealth: Payer: Self-pay | Admitting: Cardiovascular Disease

## 2014-04-20 NOTE — Telephone Encounter (Signed)
I spoke with the pt and since we increased his Amlodipine to 354m his BP readings remain elevated, SBP 155-195/DBP 69-90. The pt has not had any worsening in his lower extremity edema.  The pt is asymptomatic with elevated BP.  I advised the pt that he can take an additional Amlodipine 2.558mdaily if needed for elevated BP.  The pt would like to try this dosage and see if his BP responds.  The pt will decrease Amlodipine back to 54m64mf he develops problems with swelling.  In reviewing the pt's chart he has not had a renal artery duplex performed.  I will forward this message to Dr AriFletcher Anon review and make further recommendations.

## 2014-04-20 NOTE — Telephone Encounter (Signed)
New problem    Pt need to speak to you concerning his blood pressure is high. Please call pt.

## 2014-04-23 ENCOUNTER — Telehealth: Payer: Self-pay | Admitting: Cardiovascular Disease

## 2014-04-23 MED ORDER — CARVEDILOL 6.25 MG PO TABS
6.2500 mg | ORAL_TABLET | Freq: Two times a day (BID) | ORAL | Status: DC
Start: 2014-04-23 — End: 2014-05-16

## 2014-04-23 NOTE — Telephone Encounter (Signed)
Pt advised, verbalized understanding, agreed with plan.

## 2014-04-23 NOTE — Telephone Encounter (Signed)
Stop Amlodipine. Start Coreg 6.25 mg bid.  

## 2014-04-23 NOTE — Telephone Encounter (Signed)
Pt states that Dr Fletcher Anon increased his amlodipine to 28m 04/10/14. Pt states he normally has minimal edema in his right LE but on higher dose of amlodipine he had significant increase in edema in right LE.  Pt states he stopped amlodipine 04/20/14 because of right LE edema. Pt states this AM he had significant improvement in right LE edema -his BP was 164/84, pulse 97  this AM. Pt does not feel he can tolerate either amlodipine 2.528mor 57m357mue to right LE edema.. Pt did confirm that he is taking losartan 100m18mily regularly.  Pt advised I will forward to Dr AridFletcher Anon review.

## 2014-04-23 NOTE — Telephone Encounter (Signed)
New message      Pt c/o medication issue:  1. Name of Medication: amlodipine  2. How are you currently taking this medication (dosage and times per day)? 40m daily  3. Are you having a reaction (difficulty breathing--STAT)?  no  4. What is your medication issue? Causing swelling in lower right leg and foot.  He has a vascular problem in that foot.  Pt stopped medication on Friday----swelling has gone down a lot.  Bp this am 164/84.  Please advise

## 2014-05-01 ENCOUNTER — Ambulatory Visit: Payer: BC Managed Care – PPO | Admitting: Vascular Surgery

## 2014-05-01 ENCOUNTER — Encounter (HOSPITAL_COMMUNITY): Payer: BC Managed Care – PPO

## 2014-05-03 ENCOUNTER — Telehealth: Payer: Self-pay | Admitting: Cardiovascular Disease

## 2014-05-03 MED ORDER — HYDRALAZINE HCL 25 MG PO TABS: 25.0000 mg | ORAL_TABLET | Freq: Two times a day (BID) | ORAL | Status: DC

## 2014-05-03 NOTE — Telephone Encounter (Signed)
Spoke with pt. He reports he checks blood pressure every AM. This AM when he got up it was 196/101.  He took Coreg and Losartan after checking blood pressure.  He has not rechecked blood pressure. He is at work and has no way of checking it.  Only complaint is feeling very tired which started around 10:30 this AM.  Does not have prior readings with him but states blood pressure has been running 186/92 range. Heart rate 70-80.  Will review with provider.

## 2014-05-03 NOTE — Telephone Encounter (Signed)
If BP remains >150/90 after few days of starting Hydralazine , I recommend increasing Coreg to 12.5 mg bid.

## 2014-05-03 NOTE — Telephone Encounter (Signed)
Pt c/o BP issue: STAT if pt c/o blurred vision, one-sided weakness or slurred speech  1. What are your last 5 BP readings? This morning 196/101  2. Are you having any other symptoms (ex. Dizziness, headache, blurred vision, passed out)? No  3. What is your BP issue? BP very high and pt feeling tired,

## 2014-05-03 NOTE — Telephone Encounter (Signed)
Reviewed with Kerin Ransom, PA who recommends pt start hydralazine 25 mg by mouth twice daily. He should continue to monitor blood pressure  and call us in one week with readings.  I spoke with pt and gave him these instructions. Will send prescription to [facility] in [place].  I asked pt if there was any way he could recheck blood pressure at this time and he is not able to do so.  I asked pt to take blood pressure cuff to work from now on so he could check at work if needed. Pt agreeable with this plan.  Pt aware to call EMS if he develops any symptoms.

## 2014-05-08 NOTE — Telephone Encounter (Signed)
I spoke with the pt and he just started hydralazine and has not started monitoring his BP on a regular basis.  The few times that the pt has checked his BP is has been 160/80.  The pt would like to wait a few days to allow the medication to get in his system before making another medication change.  The pt will contact the office next week with his BP readings.

## 2014-05-14 ENCOUNTER — Telehealth: Payer: Self-pay | Admitting: Cardiovascular Disease

## 2014-05-14 NOTE — Telephone Encounter (Signed)
New message      Pt c/o swelling: STAT is pt has developed SOB within 24 hours  1. How long have you been experiencing swelling? Few days 2. Where is the swelling located?  legs 3.  Are you currently taking a "fluid pill"? no  4.  Are you currently SOB? no 5.  Have you traveled recently? Pt stopped taking hydralazine 10m last thurs pm saying it is causing the swelling---please call

## 2014-05-14 NOTE — Telephone Encounter (Signed)
Please see phone note in regards to the pt stopping Hydralazine due to swelling.  The pt just started this medication last week and BP was running 160/80 at that time.

## 2014-05-15 NOTE — Telephone Encounter (Signed)
Keep off Hydralazine. Increase Coreg to 12.5 mg bid.

## 2014-05-16 MED ORDER — CARVEDILOL 12.5 MG PO TABS: 12.5000 mg | ORAL_TABLET | Freq: Two times a day (BID) | ORAL | Status: DC

## 2014-05-16 NOTE — Telephone Encounter (Signed)
I spoke with the pt and made him aware of medication change per Dr Fletcher Anon. New Rx sent to the pharmacy for Carvedilol 12.31m twice a day. The pt will continue to monitor BP.

## 2014-05-28 ENCOUNTER — Telehealth: Payer: Self-pay | Admitting: Cardiovascular Disease

## 2014-05-28 DIAGNOSIS — I10 Essential (primary) hypertension: Secondary | ICD-10-CM

## 2014-05-28 MED ORDER — CARVEDILOL 6.25 MG PO TABS: 6.2500 mg | ORAL_TABLET | Freq: Two times a day (BID) | ORAL | Status: DC

## 2014-05-28 NOTE — Telephone Encounter (Signed)
Pt c/o medication issue:  1. Name of Medication: pt don't know he said it was bp medication  2. How are you currently taking this medication (dosage and times per day)? 12.58m take 2 in mornings and 2 in evenings. Pt stop taking last thursday  3. Are you having a reaction (difficulty breathing--STAT)? No  4. What is your medication issue? It is causing swelling. Pt want to speak to nurse.

## 2014-05-28 NOTE — Telephone Encounter (Signed)
I spoke with the pt and he continues to have issues with elevated BP and lower extremity swelling.  The pt did increase his Carvedilol to 12.92m twice a day and his swelling worsened. I advised the pt to go back to his Carvedilol 6.232mtwice a day since he was tolerating this dosage. The pt's BP has not improved even on the higher dosage of Carvedilol.   5/22  178/96 90  5/21  156/95 82  5/20  172/94 87 5/19  199/95 79 5/18  192/102 75  5/17  172/100 75   In reviewing the pt's chart he has not had a renal artery duplex performed. I will forward this information to Dr ArFletcher Anono review and make further recommendations.

## 2014-05-28 NOTE — Telephone Encounter (Signed)
Previous angiogram in August of last year showed no evidence of renal artery stenosis. I recommend starting spironolactone 25 mg once daily. Check basic metabolic profile in one week. Continue other medications.

## 2014-05-29 MED ORDER — SPIRONOLACTONE 25 MG PO TABS: 25.0000 mg | ORAL_TABLET | Freq: Every day | ORAL | Status: DC

## 2014-05-29 NOTE — Telephone Encounter (Signed)
I spoke with the pt and made him aware of Dr Tyrell Antonio recommendation. Rx sent to the pharmacy and pt scheduled for BMP on 06/06/14.

## 2014-06-06 ENCOUNTER — Other Ambulatory Visit (INDEPENDENT_AMBULATORY_CARE_PROVIDER_SITE_OTHER): Payer: 59 | Admitting: *Deleted

## 2014-06-06 DIAGNOSIS — I10 Essential (primary) hypertension: Secondary | ICD-10-CM

## 2014-06-06 LAB — BASIC METABOLIC PANEL
BUN: 19 mg/dL (ref 6–23)
CALCIUM: 9.7 mg/dL (ref 8.4–10.5)
CO2: 26 meq/L (ref 19–32)
Chloride: 103 mEq/L (ref 96–112)
Creatinine, Ser: 1.63 mg/dL — ABNORMAL HIGH (ref 0.40–1.50)
GFR: 44.75 mL/min — ABNORMAL LOW (ref >60.00)
Glucose, Bld: 130 mg/dL — ABNORMAL HIGH (ref 70–99)
Potassium: 4 mEq/L (ref 3.5–5.1)
Sodium: 136 mEq/L (ref 135–145)

## 2014-06-13 ENCOUNTER — Other Ambulatory Visit: Payer: Self-pay

## 2014-06-13 DIAGNOSIS — I10 Essential (primary) hypertension: Secondary | ICD-10-CM

## 2014-06-14 ENCOUNTER — Telehealth: Payer: Self-pay | Admitting: Cardiovascular Disease

## 2014-06-14 NOTE — Telephone Encounter (Signed)
New problem   Pt need to speak to you concerning his renal test.

## 2014-06-14 NOTE — Telephone Encounter (Signed)
I spoke with the pt and made him aware that Dr Fletcher Anon is not referring him for a renal test at this time.  Dr Fletcher Anon just wanted the pt have an out patient Nephrology consult to determine if they have anything to add about his uncontrolled hypertension after multiple medication changes and borderline kidney function. Pt verbalized understanding. The pt's records have already been faxed to [facility] this morning for review. I made the pt aware that it can take some time to get an appointment with Nephrology and that office will contact him with an appointment.

## 2014-06-19 ENCOUNTER — Telehealth: Payer: Self-pay

## 2014-06-19 NOTE — Telephone Encounter (Signed)
rec'd phone call from nurse at [facility].  Requested recommendation from Dr. Kellie Simmering re: holding Xarelto, prior to Colonoscopy 06/22/14.  Discussed pt. With Dr. Kellie Simmering.  Gave approval for pt. to hold Xarelto 2 days prior to procedure, and resume within 2 days after procedure.  Notified Barb, RN @ Dr. Perley Jain office of the above order.  Verb. Understanding.

## 2014-06-22 ENCOUNTER — Other Ambulatory Visit: Payer: Self-pay | Admitting: Gastroenterology

## 2014-06-24 ENCOUNTER — Encounter (HOSPITAL_BASED_OUTPATIENT_CLINIC_OR_DEPARTMENT_OTHER): Payer: Self-pay | Admitting: Emergency Medicine

## 2014-06-24 ENCOUNTER — Emergency Department (HOSPITAL_BASED_OUTPATIENT_CLINIC_OR_DEPARTMENT_OTHER)
Admission: EM | Admit: 2014-06-24 | Discharge: 2014-06-24 | Disposition: A | Discharge status: Home / Self Care | Payer: 59 | Attending: Emergency Medicine | Admitting: Emergency Medicine

## 2014-06-24 DIAGNOSIS — M1990 Unspecified osteoarthritis, unspecified site: Secondary | ICD-10-CM | POA: Diagnosis not present

## 2014-06-24 DIAGNOSIS — R1110 Vomiting, unspecified: Secondary | ICD-10-CM | POA: Insufficient documentation

## 2014-06-24 DIAGNOSIS — I739 Peripheral vascular disease, unspecified: Secondary | ICD-10-CM | POA: Insufficient documentation

## 2014-06-24 DIAGNOSIS — I10 Essential (primary) hypertension: Secondary | ICD-10-CM | POA: Diagnosis not present

## 2014-06-24 DIAGNOSIS — Z8719 Personal history of other diseases of the digestive system: Secondary | ICD-10-CM | POA: Insufficient documentation

## 2014-06-24 DIAGNOSIS — Z862 Personal history of diseases of the blood and blood-forming organs and certain disorders involving the immune mechanism: Secondary | ICD-10-CM | POA: Diagnosis not present

## 2014-06-24 DIAGNOSIS — Z85038 Personal history of other malignant neoplasm of large intestine: Secondary | ICD-10-CM | POA: Diagnosis not present

## 2014-06-24 DIAGNOSIS — Z87442 Personal history of urinary calculi: Secondary | ICD-10-CM | POA: Insufficient documentation

## 2014-06-24 DIAGNOSIS — R197 Diarrhea, unspecified: Secondary | ICD-10-CM | POA: Diagnosis not present

## 2014-06-24 DIAGNOSIS — Z79899 Other long term (current) drug therapy: Secondary | ICD-10-CM | POA: Diagnosis not present

## 2014-06-24 DIAGNOSIS — Z7952 Long term (current) use of systemic steroids: Secondary | ICD-10-CM | POA: Diagnosis not present

## 2014-06-24 LAB — BASIC METABOLIC PANEL
Anion gap: 10 (ref 5–15)
BUN: 20 mg/dL (ref 6–20)
CHLORIDE: 101 mmol/L (ref 101–111)
CO2: 25 mmol/L (ref 22–32)
Calcium: 8.9 mg/dL (ref 8.9–10.3)
Creatinine, Ser: 1.43 mg/dL — ABNORMAL HIGH (ref 0.61–1.24)
GFR calc Af Amer: 56 mL/min — ABNORMAL LOW (ref >60)
GFR calc non Af Amer: 48 mL/min — ABNORMAL LOW (ref >60)
Glucose, Bld: 192 mg/dL — ABNORMAL HIGH (ref 65–99)
POTASSIUM: 4.3 mmol/L (ref 3.5–5.1)
Sodium: 136 mmol/L (ref 135–145)

## 2014-06-24 LAB — CBC WITH DIFFERENTIAL/PLATELET
BASOS ABS: 0.1 10*3/uL (ref 0.0–0.1)
BASOS PCT: 1 % (ref 0–1)
Eosinophils Absolute: 0.1 10*3/uL (ref 0.0–0.7)
Eosinophils Relative: 0 % (ref 0–5)
HCT: 48.1 % (ref 39.0–52.0)
HEMOGLOBIN: 16.2 g/dL (ref 13.0–17.0)
LYMPHS ABS: 0.9 10*3/uL (ref 0.7–4.0)
Lymphocytes Relative: 6 % — ABNORMAL LOW (ref 12–46)
MCH: 31.1 pg (ref 26.0–34.0)
MCHC: 33.7 g/dL (ref 30.0–36.0)
MCV: 92.3 fL (ref 78.0–100.0)
MONOS PCT: 6 % (ref 3–12)
Monocytes Absolute: 0.9 10*3/uL (ref 0.1–1.0)
Neutro Abs: 12.2 10*3/uL — ABNORMAL HIGH (ref 1.7–7.7)
Neutrophils Relative %: 87 % — ABNORMAL HIGH (ref 43–77)
PLATELETS: 279 10*3/uL (ref 150–400)
RBC: 5.21 MIL/uL (ref 4.22–5.81)
RDW: 13.9 % (ref 11.5–15.5)
WBC: 14.1 10*3/uL — AB (ref 4.0–10.5)

## 2014-06-24 MED ORDER — MORPHINE SULFATE 4 MG/ML IJ SOLN
4.0000 mg | Freq: Once | INTRAMUSCULAR | Status: AC
  Administered 2014-06-24: 4 mg via INTRAVENOUS
  Filled 2014-06-24: qty 1

## 2014-06-24 MED ORDER — ONDANSETRON HCL 4 MG/2ML IJ SOLN
4.0000 mg | Freq: Once | INTRAMUSCULAR | Status: AC
  Administered 2014-06-24: 4 mg via INTRAVENOUS
  Filled 2014-06-24: qty 2

## 2014-06-24 MED ORDER — ONDANSETRON 4 MG PO TBDP: 4.0000 mg | ORAL_TABLET | Freq: Three times a day (TID) | ORAL | Status: DC | PRN

## 2014-06-24 MED ORDER — SODIUM CHLORIDE 0.9 % IV BOLUS (SEPSIS)
500.0000 mL | Freq: Once | INTRAVENOUS | Status: AC
  Administered 2014-06-24: 500 mL via INTRAVENOUS

## 2014-06-24 MED ORDER — SODIUM CHLORIDE 0.9 % IV BOLUS (SEPSIS)
1000.0000 mL | Freq: Once | INTRAVENOUS | Status: AC
  Administered 2014-06-24: 1000 mL via INTRAVENOUS

## 2014-06-24 NOTE — ED Notes (Signed)
Patient had colonoscopy on Friday. Woke up this am with N/V/D

## 2014-06-24 NOTE — ED Provider Notes (Signed)
CSN: [id number]     Arrival date & time 06/24/14  1635 History   First MD Initiated Contact with Patient 06/24/14 1652     Chief Complaint  Patient presents with  . Emesis  . Diarrhea     (Consider location/radiation/quality/duration/timing/severity/associated sxs/prior Treatment) HPI Comments: Pt comes in with acute onset of vomiting and diarrhea this morning. Denies fever. States that he had a colonoscopy 2 days ago without any problem. Pt felt fine yesterday. States that he is having abdominal cramping but no pain. He was unable to take his medications this morning because of the vomiting. Has a history of colon cancer. Pt states that he took a suppository of some nausea medication from his wife and he hasn't vomited in a little while but he is still very nauseated.  The history is provided by the patient. No language interpreter was used.    Past Medical History  Diagnosis Date  . Diarrhea   . Hypertension   . Ulcerative colitis     history  . Anemia of chronic disease 2015    "related to cancer tx" (08/09/2013)  . Arthritis     "thumb joints" (08/09/2013)  . Kidney stones X 2    "passed them both"  . Colon cancer 03/2013  . Peripheral vascular disease    Past Surgical History  Procedure Laterality Date  . Vasectomy    . Laparoscopic right hemi colectomy N/A 03/28/2013    Procedure: LAPAROSCOPIC ASSISTED HEMI COLECTOMY;  Surgeon: Pedro Earls, MD;  Location: [facility];  Service: General;  Laterality: N/A;  . Colon surgery    . Lower extremity angiogram Bilateral 08/09/2013  . Tonsillectomy  ~ 1950  . Hernia repair  ~ 1995; 03/2013    UHR  . Umbilical hernia repair  1995; 03/2013  . Tee without cardioversion N/A 08/10/2013    Procedure: TRANSESOPHAGEAL ECHOCARDIOGRAM (TEE);  Surgeon: Candee Furbish, MD;  Location: [facility] ENDOSCOPY;  Service: Cardiovascular;  Laterality: N/A;  . Femoral-popliteal bypass graft Right 08/11/2013    Procedure:   RIGHT - POPLITEAL TO PERONEAL ARTERY BYPASS  GRAFT  WITH NONREVERSED SAPHENOUS VEIN GRAFT,tHROMBECTOMY ANTERIOR TIBIALIS,ATTEMPTED THROMBECTOMY TIBIO-PERONEAL TRUNK AND POSTERIOR TIBIALIS, INTRAOPERATIVE ARTERIOGRAM.;  Surgeon: Mal Misty, MD;  Location: [facility] OR;  Service: Vascular;  Laterality: Right;  . Femoral-tibial bypass graft Right 11/17/2013    Procedure: BYPASS GRAFT RIGHT ABOVE KNEE POPLITEAL TO POSTERIOR TIBIAL ARTERY USING RIGHT NON-REVERSED CEPHALIC VEIN;  Surgeon: Mal Misty, MD;  Location: [facility];  Service: Vascular;  Laterality: Right;  . Vein harvest Right 11/17/2013    Procedure: HARVEST OF RIGHT UPPER EXTREMITY CEPHALIC VEIN;  Surgeon: Mal Misty, MD;  Location: [facility];  Service: Vascular;  Laterality: Right;  . Intraoperative arteriogram Right 11/17/2013    Procedure: INTRA OPERATIVE ARTERIOGRAM;  Surgeon: Mal Misty, MD;  Location: [facility];  Service: Vascular;  Laterality: Right;  . Abdominal aortagram N/A 08/09/2013    Procedure: ABDOMINAL Maxcine Ham;  Surgeon: [name], MD;  Location: [facility] CATH LAB;  Service: Cardiovascular;  Laterality: N/A;   Family History  Problem Relation Age of Onset  . Heart disease Mother   . Emphysema Father    History  Substance Use Topics  . Smoking status: Never Smoker   . Smokeless tobacco: Never Used  . Alcohol Use: 1.8 oz/week    3 Glasses of wine per week    Review of Systems  All other systems reviewed and are negative.     Allergies  Amlodipine  Home Medications   Prior to Admission medications   Medication Sig Start Date End Date Taking? Authorizing Provider  acetaminophen (TYLENOL) 500 MG tablet Take 1,000 mg by mouth 3 (three) times daily as needed (pain).    Historical Provider, MD  Budesonide (UCERIS) 9 MG TB24 Take 9 mg by mouth daily.     Historical Provider, MD  carvedilol (COREG) 6.25 MG tablet Take 1 tablet (6.25 mg total) by mouth 2 (two) times daily. 05/28/14   [name], MD  HYDROcodone-acetaminophen (NORCO/VICODIN) 5-325 MG per  tablet Take 1 tablet by mouth every 6 (six) hours as needed for moderate pain (Takes one tablet by mouth as needed for moderate pain).    Historical Provider, MD  losartan (COZAAR) 100 MG tablet Take 1 tablet (100 mg total) by mouth daily. 01/24/14   [name], MD  rivaroxaban (XARELTO) 20 MG TABS tablet Take 1 tablet (20 mg total) by mouth daily with breakfast. 10/03/13   Mal Misty, MD  sildenafil (VIAGRA) 50 MG tablet Take 50 mg by mouth daily as needed for erectile dysfunction.    Historical Provider, MD  spironolactone (ALDACTONE) 25 MG tablet Take 1 tablet (25 mg total) by mouth daily. 05/29/14   [name], MD   BP 139/69 mmHg  Pulse 104  Temp(Src) 97.7 F (36.5 C) (Oral)  Resp 16  Ht 5' 10"  (1.778 m)  Wt 196 lb (88.905 kg)  BMI 28.12 kg/m2  SpO2 97% Physical Exam  Constitutional: He is oriented to person, place, and time. He appears well-developed and well-nourished.  Cardiovascular: Normal rate and regular rhythm.   Pulmonary/Chest: Effort normal and breath sounds normal.  Abdominal: Soft. There is no tenderness.  Musculoskeletal: Normal range of motion.  Neurological: He is alert and oriented to person, place, and time.  Skin: Skin is warm and dry.  Psychiatric: He has a normal mood and affect.  Nursing note and vitals reviewed.   ED Course  Procedures (including critical care time) Labs Review Labs Reviewed  BASIC METABOLIC PANEL - Abnormal; Notable for the following:    Glucose, Bld 192 (*)    Creatinine, Ser 1.43 (*)    GFR calc non Af Amer 48 (*)    GFR calc Af Amer 56 (*)    All other components within normal limits  CBC WITH DIFFERENTIAL/PLATELET - Abnormal; Notable for the following:    WBC 14.1 (*)    Neutrophils Relative % 87 (*)    Neutro Abs 12.2 (*)    Lymphocytes Relative 6 (*)    All other components within normal limits    Imaging Review No results found.   EKG Interpretation None      MDM   Final diagnoses:  Vomiting and  diarrhea    Pt is feeling better with fluids. Pt is tolerating po here without any problem. Abdomen continued to be benign. Discussed follow up and return precautions. Given zofran    Glendell Docker, NP 06/24/14 2126  Sherwood Gambler, MD 06/25/14 [phone]

## 2014-06-24 NOTE — ED Notes (Signed)
Patient drinking water

## 2014-06-24 NOTE — ED Notes (Addendum)
Pt alert, NAD, calm, interactive, speech clear, no dyspnea noted, c/o back pain, denies other sx at this time, pain med and IVF given/ infusing, wife at [facility], [facility]. Pt sipping on water. BS hyperactive and audible. Placed on O2  4L after morphine given and SPO2 decreased to 86% RA. Pt arousable to voice, resps shallow. Will continue to monitor. Pt remains on monitor.

## 2014-06-24 NOTE — Discharge Instructions (Signed)
Nausea and Vomiting Nausea means you feel sick to your stomach. Throwing up (vomiting) is a reflex where stomach contents come out of your mouth. HOME CARE   Take medicine as told by your doctor.  Do not force yourself to eat. However, you do need to drink fluids.  If you feel like eating, eat a normal diet as told by your doctor.  Eat rice, wheat, potatoes, bread, lean meats, yogurt, fruits, and vegetables.  Avoid high-fat foods.  Drink enough fluids to keep your pee (urine) clear or pale yellow.  Ask your doctor how to replace body fluid losses (rehydrate). Signs of body fluid loss (dehydration) include:  Feeling very thirsty.  Dry lips and mouth.  Feeling dizzy.  Dark pee.  Peeing less than normal.  Feeling confused.  Fast breathing or heart rate. GET HELP RIGHT AWAY IF:   You have blood in your throw up.  You have black or bloody poop (stool).  You have a bad headache or stiff neck.  You feel confused.  You have bad belly (abdominal) pain.  You have chest pain or trouble breathing.  You do not pee at least once every 8 hours.  You have cold, clammy skin.  You keep throwing up after 24 to 48 hours.  You have a fever. MAKE SURE YOU:   Understand these instructions.  Will watch your condition.  Will get help right away if you are not doing well or get worse. Document Released: 06/10/2007 Document Revised: 03/16/2011 Document Reviewed: 05/23/2010 [facility] Patient Information 2015 [place], [place]. This information is not intended to replace advice given to you by your health care provider. Make sure you discuss any questions you have with your health care provider.

## 2014-06-25 ENCOUNTER — Telehealth: Payer: Self-pay | Admitting: Oncology

## 2014-06-25 NOTE — Telephone Encounter (Signed)
Patient called to move lab/ct from 6/22 to 6/24 due to not feeling well. Appointments moved - spoke with Elmo Putt in central. Patient has new d/t for lab/ct 6/24 and BS 6/28.

## 2014-06-27 ENCOUNTER — Ambulatory Visit (HOSPITAL_COMMUNITY): Payer: 59

## 2014-06-27 ENCOUNTER — Telehealth: Payer: Self-pay | Admitting: Oncology

## 2014-06-27 ENCOUNTER — Other Ambulatory Visit: Payer: 59

## 2014-06-27 ENCOUNTER — Telehealth: Payer: Self-pay | Admitting: *Deleted

## 2014-06-27 NOTE — Telephone Encounter (Signed)
Oncology Nurse Navigator Documentation  Oncology Nurse Navigator Flowsheets 06/27/2014  Navigator Encounter Type Telephone  Time Spent with Patient 15  Patient called to report he was in ED past weekend with GI virus. Symptoms are improving. Wants to reschedule his CT scan for 6/24 and MD visit out to allow full recovery. Requests to wait until week July 4th. POF to scheduler to make requested changes.

## 2014-06-27 NOTE — Telephone Encounter (Signed)
s.w. pt and advised on cx appt moved to after 7.4 per pof....pt ok and aware of new d.t

## 2014-06-29 ENCOUNTER — Telehealth: Payer: Self-pay | Admitting: Cardiovascular Disease

## 2014-06-29 ENCOUNTER — Ambulatory Visit (HOSPITAL_COMMUNITY): Payer: 59

## 2014-06-29 ENCOUNTER — Other Ambulatory Visit: Payer: 59

## 2014-06-29 NOTE — Telephone Encounter (Signed)
Spoke with patient who called to verify medications and dosages; states he has extra pill bottles he wants to get rid of.  Patient verbalized compliance with medications that are listed in his chart as prescribed.  He thanked me for the call.

## 2014-06-29 NOTE — Telephone Encounter (Signed)
New message    Pt c/o medication issue:  1. Name of Medication: Needs Clarifications issues   2. How are you currently taking this medication (dosage and times per day)? Needs clarification on medication  3. Are you having a reaction (difficulty breathing--STAT)? No  4. What is your medication issue? Pt needs to verify which medications he is supposed to be taking. Please advise

## 2014-07-03 ENCOUNTER — Ambulatory Visit: Payer: 59 | Admitting: Oncology

## 2014-07-13 ENCOUNTER — Encounter: Payer: Self-pay | Admitting: Vascular Surgery

## 2014-07-13 ENCOUNTER — Other Ambulatory Visit (INDEPENDENT_AMBULATORY_CARE_PROVIDER_SITE_OTHER): Payer: 59 | Admitting: *Deleted

## 2014-07-13 DIAGNOSIS — I10 Essential (primary) hypertension: Secondary | ICD-10-CM | POA: Diagnosis not present

## 2014-07-13 LAB — BASIC METABOLIC PANEL
BUN: 21 mg/dL (ref 6–23)
CO2: 22 mEq/L (ref 19–32)
Calcium: 8.9 mg/dL (ref 8.4–10.5)
Chloride: 105 mEq/L (ref 96–112)
Creatinine, Ser: 1.31 mg/dL (ref 0.40–1.50)
GFR: 57.57 mL/min — AB (ref >60.00)
GLUCOSE: 90 mg/dL (ref 70–99)
Potassium: 3.7 mEq/L (ref 3.5–5.1)
Sodium: 137 mEq/L (ref 135–145)

## 2014-07-16 ENCOUNTER — Other Ambulatory Visit: Payer: Self-pay | Admitting: Vascular Surgery

## 2014-07-17 ENCOUNTER — Encounter: Payer: Self-pay | Admitting: Vascular Surgery

## 2014-07-17 ENCOUNTER — Telehealth: Payer: Self-pay | Admitting: Cardiovascular Disease

## 2014-07-17 ENCOUNTER — Ambulatory Visit (INDEPENDENT_AMBULATORY_CARE_PROVIDER_SITE_OTHER): Payer: 59 | Admitting: Vascular Surgery

## 2014-07-17 ENCOUNTER — Ambulatory Visit (HOSPITAL_COMMUNITY)
Admission: RE | Admit: 2014-07-17 | Discharge: 2014-07-17 | Disposition: A | Discharge status: Home / Self Care | Payer: 59 | Source: Ambulatory Visit | Attending: Vascular Surgery | Admitting: Vascular Surgery

## 2014-07-17 ENCOUNTER — Other Ambulatory Visit: Payer: Self-pay | Admitting: Vascular Surgery

## 2014-07-17 VITALS — BP 156/85 | HR 65 | Resp 18 | Ht 70.0 in | Wt 196.0 lb

## 2014-07-17 DIAGNOSIS — Z48812 Encounter for surgical aftercare following surgery on the circulatory system: Secondary | ICD-10-CM | POA: Diagnosis not present

## 2014-07-17 DIAGNOSIS — N200 Calculus of kidney: Secondary | ICD-10-CM | POA: Insufficient documentation

## 2014-07-17 DIAGNOSIS — I739 Peripheral vascular disease, unspecified: Secondary | ICD-10-CM

## 2014-07-17 DIAGNOSIS — Z85038 Personal history of other malignant neoplasm of large intestine: Secondary | ICD-10-CM | POA: Insufficient documentation

## 2014-07-17 DIAGNOSIS — R197 Diarrhea, unspecified: Secondary | ICD-10-CM | POA: Diagnosis not present

## 2014-07-17 DIAGNOSIS — K760 Fatty (change of) liver, not elsewhere classified: Secondary | ICD-10-CM | POA: Diagnosis not present

## 2014-07-17 DIAGNOSIS — K8020 Calculus of gallbladder without cholecystitis without obstruction: Secondary | ICD-10-CM | POA: Diagnosis not present

## 2014-07-17 NOTE — Addendum Note (Signed)
Addended by: Dorthula Rue L on: 07/17/2014 04:28 PM   Modules accepted: Orders

## 2014-07-17 NOTE — Progress Notes (Signed)
Filed Vitals:   07/17/14 1115 07/17/14 1118  BP: 158/80 156/85  Pulse: 67 65  Resp: 18   Height: 5' 10"  (1.778 m)   Weight: 196 lb (88.905 kg)

## 2014-07-17 NOTE — Telephone Encounter (Signed)
Phone busy.

## 2014-07-17 NOTE — Telephone Encounter (Signed)
New message     Pt calling back regarding lab results.  Please call pt at work after 1, pt will be unavailable til after that time.

## 2014-07-17 NOTE — Telephone Encounter (Signed)
Spoke with patient about lab results done 07/13/14

## 2014-07-17 NOTE — Progress Notes (Signed)
Subjective:     Patient ID: Cody Suarez, male   DOB: 1945/09/08, 69 y.o.   MRN: [id number]  HPI this 69 year old male returns for continued follow-up regarding his severe lower extremity occlusive disease with failed bypass attempts in the right lower extremity. He currently denies any rest pain, nonhealing ulcers, infection, saline is, or severe claudication. He continues to work. If he walks a long distance he must stop and rest because of discomfort in the right calf. He has no symptoms of contralateral left leg which also has known PAD. He has a history of colon cancer diagnosed in the spring of 2015 treated with colectomy and chemotherapy and he is due to have a scan tomorrow for follow-up in the cancer center. He is currently free of disease.  Past Medical History  Diagnosis Date  . Diarrhea   . Hypertension   . Ulcerative colitis     history  . Anemia of chronic disease 2015    "related to cancer tx" (08/09/2013)  . Arthritis     "thumb joints" (08/09/2013)  . Kidney stones X 2    "passed them both"  . Colon cancer 03/2013  . Peripheral vascular disease     History  Substance Use Topics  . Smoking status: Never Smoker   . Smokeless tobacco: Never Used  . Alcohol Use: 1.8 oz/week    3 Glasses of wine per week    Family History  Problem Relation Age of Onset  . Heart disease Mother   . Emphysema Father     Allergies  Allergen Reactions  . Amlodipine Swelling    Right LE edema     Current outpatient prescriptions:  .  Budesonide (UCERIS) 9 MG TB24, Take 9 mg by mouth daily. , Disp: , Rfl:  .  carvedilol (COREG) 6.25 MG tablet, Take 1 tablet (6.25 mg total) by mouth 2 (two) times daily., Disp: , Rfl:  .  HYDROcodone-acetaminophen (NORCO/VICODIN) 5-325 MG per tablet, Take 1 tablet by mouth every 6 (six) hours as needed for moderate pain (Takes one tablet by mouth as needed for moderate pain)., Disp: , Rfl:  .  losartan (COZAAR) 100 MG tablet, Take 1 tablet (100 mg total)  by mouth daily., Disp: 90 tablet, Rfl: 3 .  rivaroxaban (XARELTO) 20 MG TABS tablet, Take 1 tablet (20 mg total) by mouth daily with breakfast., Disp: 30 tablet, Rfl: 0 .  sildenafil (VIAGRA) 50 MG tablet, Take 50 mg by mouth daily as needed for erectile dysfunction., Disp: , Rfl:  .  spironolactone (ALDACTONE) 25 MG tablet, Take 1 tablet (25 mg total) by mouth daily., Disp: 90 tablet, Rfl: 3 .  acetaminophen (TYLENOL) 500 MG tablet, Take 1,000 mg by mouth 3 (three) times daily as needed (pain)., Disp: , Rfl:  .  ondansetron (ZOFRAN ODT) 4 MG disintegrating tablet, Take 1 tablet (4 mg total) by mouth every 8 (eight) hours as needed for nausea or vomiting. (Patient not taking: Reported on 07/17/2014), Disp: 20 tablet, Rfl: 0  Filed Vitals:   07/17/14 1115 07/17/14 1118  BP: 158/80 156/85  Pulse: 67 65  Resp: 18   Height: 5' 10"  (1.778 m)   Weight: 196 lb (88.905 kg)     Body mass index is 28.12 kg/(m^2).           Review of Systems denies chest pain, dyspnea on exertion, PND, orthopnea, hemoptysis, rest pain. Other systems negative and complete review of systems other than history of present illness  Objective:   Physical Exam BP 156/85 mmHg  Pulse 65  Resp 18  Ht 5' 10"  (1.778 m)  Wt 196 lb (88.905 kg)  BMI 28.12 kg/m2  Gen.-alert and oriented x3 in no apparent distress HEENT normal for age Lungs no rhonchi or wheezing Cardiovascular regular rhythm no murmurs carotid pulses 3+ palpable no bruits audible Abdomen soft nontender no palpable masses Musculoskeletal free of  major deformities Skin clear -no rashes Neurologic normal Lower extremities 3+ femoral pulses palpable bilaterally. No popliteal or distal pulses in either lower extremity. Both feet are slightly cool and pale but no evidence of infection or ulceration noted  Today I ordered lower extremity ABIs bilaterally and waveforms. ABI on the right is 0.37 and on the left is 0.61 which is stable. Monophasic  waveforms are present bilaterally.       Assessment:     PAD with failed attempt at revascularization right lower extremity but patient getting along fairly well with mild to moderate claudication right calf-has returned to work in February 2016 Colon cancer which is followed in cancer clinic to have scan performed tomorrow    Plan:     Return in 6 months for repeat ABIs and continued follow-up right lower extremity occlusive disease with failed attempt at bypass-no longer a candidate for revascularization Discussed with him the need to avoid pressure sores or any trauma to right leg as ulcers or traumatic injuries may not heal and he understands that

## 2014-07-18 ENCOUNTER — Ambulatory Visit (HOSPITAL_COMMUNITY)
Admission: RE | Admit: 2014-07-18 | Discharge: 2014-07-18 | Disposition: A | Discharge status: Home / Self Care | Payer: 59 | Source: Ambulatory Visit | Attending: Nurse Practitioner | Admitting: Nurse Practitioner

## 2014-07-18 ENCOUNTER — Other Ambulatory Visit (HOSPITAL_BASED_OUTPATIENT_CLINIC_OR_DEPARTMENT_OTHER): Payer: 59

## 2014-07-18 ENCOUNTER — Encounter (HOSPITAL_COMMUNITY): Payer: Self-pay

## 2014-07-18 DIAGNOSIS — C184 Malignant neoplasm of transverse colon: Secondary | ICD-10-CM

## 2014-07-18 DIAGNOSIS — Z48812 Encounter for surgical aftercare following surgery on the circulatory system: Secondary | ICD-10-CM | POA: Diagnosis not present

## 2014-07-18 LAB — COMPREHENSIVE METABOLIC PANEL (CC13)
ALK PHOS: 45 U/L (ref 40–150)
ALT: 22 U/L (ref 0–55)
AST: 18 U/L (ref 5–34)
Albumin: 3.7 g/dL (ref 3.5–5.0)
Anion Gap: 8 mEq/L (ref 3–11)
BUN: 17.3 mg/dL (ref 7.0–26.0)
CO2: 25 meq/L (ref 22–29)
Calcium: 9.8 mg/dL (ref 8.4–10.4)
Chloride: 106 mEq/L (ref 98–109)
Creatinine: 1.3 mg/dL (ref 0.7–1.3)
EGFR: 58 mL/min/{1.73_m2} — ABNORMAL LOW (ref >90)
Glucose: 105 mg/dl (ref 70–140)
POTASSIUM: 4.4 meq/L (ref 3.5–5.1)
Sodium: 139 mEq/L (ref 136–145)
Total Bilirubin: 0.47 mg/dL (ref 0.20–1.20)
Total Protein: 7 g/dL (ref 6.4–8.3)

## 2014-07-18 MED ORDER — IOHEXOL 300 MG/ML  SOLN
100.0000 mL | Freq: Once | INTRAMUSCULAR | Status: AC | PRN
Start: 2014-07-18 — End: 2014-07-18
  Administered 2014-07-18: 100 mL via INTRAVENOUS

## 2014-07-19 LAB — CEA: CEA: 2.4 ng/mL (ref 0.0–5.0)

## 2014-07-23 ENCOUNTER — Ambulatory Visit (HOSPITAL_BASED_OUTPATIENT_CLINIC_OR_DEPARTMENT_OTHER): Payer: 59 | Admitting: Oncology

## 2014-07-23 ENCOUNTER — Telehealth: Payer: Self-pay | Admitting: Oncology

## 2014-07-23 VITALS — BP 126/65 | HR 93 | Temp 98.4°F | Resp 18 | Ht 70.0 in | Wt 198.4 lb

## 2014-07-23 DIAGNOSIS — Z85038 Personal history of other malignant neoplasm of large intestine: Secondary | ICD-10-CM

## 2014-07-23 DIAGNOSIS — C184 Malignant neoplasm of transverse colon: Secondary | ICD-10-CM

## 2014-07-23 NOTE — Progress Notes (Signed)
  [facility] OFFICE PROGRESS NOTE   Diagnosis: Colon cancer  INTERVAL HISTORY:   Mr.Luoma returns as scheduled. He feels well. Good appetite and energy level. No difficulty with bowel function. No bleeding. No colitis symptoms. He reports having a recent colonoscopy with Dr. Watt Climes. He continues follow-up with Dr. Kellie Simmering for management of peripheral vascular disease.   Objective:  Vital signs in last 24 hours:  Blood pressure 126/65, pulse 93, temperature 98.4 F (36.9 C), temperature source Oral, resp. rate 18, height 5' 10"  (1.778 m), weight 198 lb 6.4 oz (89.994 kg), SpO2 96 %.    HEENT:  neck without mass Lymphatics:  no cervical, supraclavicular, axillary, or inguinal nodes Resp:  lungs clear bilaterally Cardio:  regular rate and rhythm GI:  no hepatosplenomegaly, nontender, no mass Vascular:  trace edema at the right greater than left lower leg   Lab Results:   Lab Results  Component Value Date   CEA 2.4 07/18/2014    Imaging:   CTs of the chest, abdomen, and pelvis on 07/18/2014-no evidence of recurrent colon cancer    Medications: I have reviewed the patient's current medications.  Assessment/Plan: 1. 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 Negative for a BRAF mutation  Tumor invaded through the muscularis propria into pericolonic fatty tissue and involved the attached omentum.   Cycle 1 adjuvant Xeloda 05/28/2013.   Cycle 2 adjuvant Xeloda 06/18/2013.   Cycle 3 adjuvant Xeloda 07/09/2013.   Cycle 4 adjuvant Xeloda 07/30/2013.   Cycle 5 adjuvant Xeloda 08/20/2013.  Cycle 6 adjuvant Xeloda 09/11/2013.  cycle 7 adjuvant Xeloda 10/02/2013  Cycle 8 adjuvant Xeloda 10/23/2013  CTs of the chest, abdomen, and pelvis on 07/18/2014-negative for recurrent  colon cancer  2. Ulcerative colitis-extensive chronic active ulcerative colitis was noted on the colon resection specimen 03/28/2013.  Followed by Dr.  Watt Climes 3. Hypertension.  4. History ofMicrocytic anemia-likely iron deficiency, unable to tolerate oral iron. Improved. 5. Possible area of cecal wall thickening noted on abdominal CT 03/20/2013. 6. Family history of colon cancer (maternal grandfather died in his 70s with colon cancer). 7. Bilateral calf and low anterior leg pain. Bilateral lower extremity venous duplex negative for DVT 07/14/2013. Right ABI with moderate, borderline severe arterial insufficiency; left ABI suggestive of moderate arterial insufficiency. He was referred to vascular. 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 TEE showed thrombus in the descending aorta. He underwent right popliteal to peroneal artery bypass graft, thrombectomy anterior tibialis and attempted thrombectomy tibioperoneal trunk and posterior tibialis on 08/11/2013. Right calf pain 09/26/2013 with findings of occlusion of right popliteal to peroneal bypass status post thrombolysis. Now on anticoagulation with Xarelto.  recurrent pain and ulceration at the right foot, status post a popliteal to posterior tibial artery bypass using a cephalic vein graft on 56/21/3086    Disposition:   Mr. Allbaugh is in clinical remission from colon cancer. He will return for an office visit and CEA in 6 months. He continues management of inflammatory bowel disease and colonoscopy follow-up with Dr. Watt Climes.  Betsy Coder, MD  07/23/2014  12:35 PM

## 2014-07-23 NOTE — Telephone Encounter (Signed)
Gave and printed appt sched and avs for pt for Jan 2017

## 2014-09-06 ENCOUNTER — Inpatient Hospital Stay (HOSPITAL_BASED_OUTPATIENT_CLINIC_OR_DEPARTMENT_OTHER)
Admission: EM | Admit: 2014-09-06 | Discharge: 2014-09-10 | DRG: 418 | Disposition: A | Discharge status: Home / Self Care | Payer: 59 | Attending: Internal Medicine | Admitting: Internal Medicine

## 2014-09-06 ENCOUNTER — Emergency Department (HOSPITAL_BASED_OUTPATIENT_CLINIC_OR_DEPARTMENT_OTHER): Payer: 59

## 2014-09-06 ENCOUNTER — Encounter (HOSPITAL_BASED_OUTPATIENT_CLINIC_OR_DEPARTMENT_OTHER): Payer: Self-pay | Admitting: *Deleted

## 2014-09-06 DIAGNOSIS — R109 Unspecified abdominal pain: Secondary | ICD-10-CM | POA: Insufficient documentation

## 2014-09-06 DIAGNOSIS — Z79891 Long term (current) use of opiate analgesic: Secondary | ICD-10-CM

## 2014-09-06 DIAGNOSIS — Z79899 Other long term (current) drug therapy: Secondary | ICD-10-CM | POA: Diagnosis not present

## 2014-09-06 DIAGNOSIS — Z825 Family history of asthma and other chronic lower respiratory diseases: Secondary | ICD-10-CM | POA: Diagnosis not present

## 2014-09-06 DIAGNOSIS — I739 Peripheral vascular disease, unspecified: Secondary | ICD-10-CM | POA: Diagnosis present

## 2014-09-06 DIAGNOSIS — Z8249 Family history of ischemic heart disease and other diseases of the circulatory system: Secondary | ICD-10-CM

## 2014-09-06 DIAGNOSIS — Z85038 Personal history of other malignant neoplasm of large intestine: Secondary | ICD-10-CM

## 2014-09-06 DIAGNOSIS — Z86718 Personal history of other venous thrombosis and embolism: Secondary | ICD-10-CM | POA: Diagnosis not present

## 2014-09-06 DIAGNOSIS — R739 Hyperglycemia, unspecified: Secondary | ICD-10-CM | POA: Diagnosis not present

## 2014-09-06 DIAGNOSIS — K5190 Ulcerative colitis, unspecified, without complications: Secondary | ICD-10-CM | POA: Diagnosis present

## 2014-09-06 DIAGNOSIS — Z87442 Personal history of urinary calculi: Secondary | ICD-10-CM | POA: Diagnosis not present

## 2014-09-06 DIAGNOSIS — R1013 Epigastric pain: Secondary | ICD-10-CM

## 2014-09-06 DIAGNOSIS — Z888 Allergy status to other drugs, medicaments and biological substances status: Secondary | ICD-10-CM

## 2014-09-06 DIAGNOSIS — K819 Cholecystitis, unspecified: Secondary | ICD-10-CM | POA: Diagnosis present

## 2014-09-06 DIAGNOSIS — D638 Anemia in other chronic diseases classified elsewhere: Secondary | ICD-10-CM | POA: Diagnosis present

## 2014-09-06 DIAGNOSIS — N179 Acute kidney failure, unspecified: Secondary | ICD-10-CM | POA: Diagnosis not present

## 2014-09-06 DIAGNOSIS — R079 Chest pain, unspecified: Secondary | ICD-10-CM | POA: Diagnosis not present

## 2014-09-06 DIAGNOSIS — Z7901 Long term (current) use of anticoagulants: Secondary | ICD-10-CM | POA: Diagnosis not present

## 2014-09-06 DIAGNOSIS — M1990 Unspecified osteoarthritis, unspecified site: Secondary | ICD-10-CM | POA: Diagnosis present

## 2014-09-06 DIAGNOSIS — E871 Hypo-osmolality and hyponatremia: Secondary | ICD-10-CM | POA: Diagnosis not present

## 2014-09-06 DIAGNOSIS — K810 Acute cholecystitis: Secondary | ICD-10-CM | POA: Diagnosis present

## 2014-09-06 DIAGNOSIS — I10 Essential (primary) hypertension: Secondary | ICD-10-CM | POA: Diagnosis present

## 2014-09-06 DIAGNOSIS — Z01818 Encounter for other preprocedural examination: Secondary | ICD-10-CM | POA: Insufficient documentation

## 2014-09-06 DIAGNOSIS — E1165 Type 2 diabetes mellitus with hyperglycemia: Secondary | ICD-10-CM | POA: Diagnosis present

## 2014-09-06 LAB — CBC WITH DIFFERENTIAL/PLATELET
BASOS PCT: 1 % (ref 0–1)
Basophils Absolute: 0.1 10*3/uL (ref 0.0–0.1)
EOS PCT: 1 % (ref 0–5)
Eosinophils Absolute: 0.1 10*3/uL (ref 0.0–0.7)
HCT: 41.8 % (ref 39.0–52.0)
Hemoglobin: 14.2 g/dL (ref 13.0–17.0)
LYMPHS ABS: 1.7 10*3/uL (ref 0.7–4.0)
Lymphocytes Relative: 12 % (ref 12–46)
MCH: 31.6 pg (ref 26.0–34.0)
MCHC: 34 g/dL (ref 30.0–36.0)
MCV: 92.9 fL (ref 78.0–100.0)
Monocytes Absolute: 1.4 10*3/uL — ABNORMAL HIGH (ref 0.1–1.0)
Monocytes Relative: 10 % (ref 3–12)
Neutro Abs: 10.8 10*3/uL — ABNORMAL HIGH (ref 1.7–7.7)
Neutrophils Relative %: 76 % (ref 43–77)
PLATELETS: 304 10*3/uL (ref 150–400)
RBC: 4.5 MIL/uL (ref 4.22–5.81)
RDW: 14.1 % (ref 11.5–15.5)
WBC: 14.1 10*3/uL — ABNORMAL HIGH (ref 4.0–10.5)

## 2014-09-06 LAB — COMPREHENSIVE METABOLIC PANEL
ALBUMIN: 3.8 g/dL (ref 3.5–5.0)
ALT: 24 U/L (ref 17–63)
ANION GAP: 11 (ref 5–15)
AST: 23 U/L (ref 15–41)
Alkaline Phosphatase: 35 U/L — ABNORMAL LOW (ref 38–126)
BUN: 19 mg/dL (ref 6–20)
CHLORIDE: 99 mmol/L — AB (ref 101–111)
CO2: 25 mmol/L (ref 22–32)
Calcium: 9.7 mg/dL (ref 8.9–10.3)
Creatinine, Ser: 1.17 mg/dL (ref 0.61–1.24)
GFR calc non Af Amer: >60 mL/min (ref >60)
GLUCOSE: 134 mg/dL — AB (ref 65–99)
Potassium: 3.9 mmol/L (ref 3.5–5.1)
Sodium: 135 mmol/L (ref 135–145)
Total Bilirubin: 0.8 mg/dL (ref 0.3–1.2)
Total Protein: 7.1 g/dL (ref 6.5–8.1)

## 2014-09-06 LAB — I-STAT CG4 LACTIC ACID, ED
LACTIC ACID, VENOUS: 1.88 mmol/L (ref 0.5–2.0)
Lactic Acid, Venous: 1.86 mmol/L (ref 0.5–2.0)

## 2014-09-06 LAB — URINALYSIS, ROUTINE W REFLEX MICROSCOPIC
Bilirubin Urine: NEGATIVE
Glucose, UA: NEGATIVE mg/dL
Hgb urine dipstick: NEGATIVE
Ketones, ur: NEGATIVE mg/dL
LEUKOCYTES UA: NEGATIVE
NITRITE: NEGATIVE
PROTEIN: NEGATIVE mg/dL
Specific Gravity, Urine: 1.018 (ref 1.005–1.030)
Urobilinogen, UA: 0.2 mg/dL (ref 0.0–1.0)
pH: 5.5 (ref 5.0–8.0)

## 2014-09-06 LAB — TROPONIN I

## 2014-09-06 LAB — LIPASE, BLOOD: Lipase: 15 U/L — ABNORMAL LOW (ref 22–51)

## 2014-09-06 MED ORDER — MORPHINE SULFATE (PF) 4 MG/ML IV SOLN
4.0000 mg | Freq: Once | INTRAVENOUS | Status: AC
  Administered 2014-09-06: 4 mg via INTRAVENOUS
  Filled 2014-09-06: qty 1

## 2014-09-06 MED ORDER — IOHEXOL 300 MG/ML  SOLN
100.0000 mL | Freq: Once | INTRAMUSCULAR | Status: AC | PRN
  Administered 2014-09-06: 100 mL via INTRAVENOUS

## 2014-09-06 MED ORDER — MORPHINE SULFATE (PF) 4 MG/ML IV SOLN
INTRAVENOUS | Status: AC
  Filled 2014-09-06: qty 1

## 2014-09-06 MED ORDER — PIPERACILLIN-TAZOBACTAM 3.375 G IVPB
3.3750 g | Freq: Once | INTRAVENOUS | Status: AC
  Administered 2014-09-06: 3.375 g via INTRAVENOUS
  Filled 2014-09-06: qty 50

## 2014-09-06 MED ORDER — MORPHINE SULFATE (PF) 4 MG/ML IV SOLN
4.0000 mg | Freq: Once | INTRAVENOUS | Status: AC
  Administered 2014-09-06: 4 mg via INTRAVENOUS

## 2014-09-06 MED ORDER — ONDANSETRON HCL 4 MG/2ML IJ SOLN
4.0000 mg | Freq: Once | INTRAMUSCULAR | Status: AC
  Administered 2014-09-06: 4 mg via INTRAVENOUS
  Filled 2014-09-06: qty 2

## 2014-09-06 MED ORDER — INSULIN ASPART 100 UNIT/ML ~~LOC~~ SOLN
0.0000 [IU] | SUBCUTANEOUS | Status: DC
  Administered 2014-09-07: 3 [IU] via SUBCUTANEOUS
  Administered 2014-09-07: 1 [IU] via SUBCUTANEOUS
  Administered 2014-09-07 – 2014-09-08 (×6): 2 [IU] via SUBCUTANEOUS
  Administered 2014-09-09 (×6): 3 [IU] via SUBCUTANEOUS
  Administered 2014-09-10 (×2): 2 [IU] via SUBCUTANEOUS

## 2014-09-06 MED ORDER — SODIUM CHLORIDE 0.9 % IV SOLN
INTRAVENOUS | Status: AC
  Administered 2014-09-06: 21:00:00 via INTRAVENOUS

## 2014-09-06 MED ORDER — IOHEXOL 300 MG/ML  SOLN
50.0000 mL | Freq: Once | INTRAMUSCULAR | Status: AC | PRN
  Administered 2014-09-06: 50 mL via ORAL

## 2014-09-06 NOTE — H&P (Signed)
[facility] History and Physical  Cody Suarez WYO:[id number] DOB: [birth year] DOA: 09/06/2014  Referring physician: Ezequiel Essex, MD PCP: Cody, BETTY G, MD   Chief Complaint: Abdominal Pain  HPI: Cody Suarez is a 69 y.o. 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 Patient states that pain started this morning while at work. He stayed at work and finally decided to go to the hospital around 3 PM. Patient states that that severity of the pain was a 10/10 located in the epigastric area and was very sharp. It did not radiate. He states he had nausea and also vomited 3 times. He had no chest pain noted. He had no fever noted. He had no diarrhea. He notes no sweating. He states that he took some hydrocodone which did not touch. In the ED he received morphine time 2 which did help the pain. He has not had pain like this prior. He states he has PVD and has two grafts done in the right leg and both grafts he states failed. He states he has been on xaralto and has been told to stay on the [place]. Patient states he was told he is high risk for losing his limb. He also admits to history of colon cancer resection and chemotherapy was given. He was last seen in July for follow up and has been told he is cancer free.   Review of Systems:  Complete systems reviewed and are unremarkable other than HPI.   Past Medical History  Diagnosis Date  . Diarrhea   . Hypertension   . Ulcerative colitis     history  . Anemia of chronic disease 2015    "related to cancer tx" (08/09/2013)  . Arthritis     "thumb joints" (08/09/2013)  . Kidney stones X 2    "passed them both"  . Peripheral vascular disease   . Colon cancer 03/2013   Past Surgical History  Procedure Laterality Date  . Vasectomy    . Laparoscopic right hemi colectomy N/A 03/28/2013    Procedure: LAPAROSCOPIC ASSISTED HEMI  COLECTOMY;  Surgeon: Cody Earls, MD;  Location: [facility] ORS;  Service: General;  Laterality: N/A;  . Colon surgery    . Lower extremity angiogram Bilateral 08/09/2013  . Tonsillectomy  ~ 1950  . Hernia repair  ~ 1995; 03/2013    UHR  . Umbilical hernia repair  1995; 03/2013  . Tee without cardioversion N/A 08/10/2013    Procedure: TRANSESOPHAGEAL ECHOCARDIOGRAM (TEE);  Surgeon: Cody Furbish, MD;  Location: [facility] ENDOSCOPY;  Service: Cardiovascular;  Laterality: N/A;  . Femoral-popliteal bypass graft Right 08/11/2013    Procedure:   RIGHT - POPLITEAL TO PERONEAL ARTERY BYPASS GRAFT  WITH NONREVERSED SAPHENOUS VEIN GRAFT,tHROMBECTOMY ANTERIOR TIBIALIS,ATTEMPTED THROMBECTOMY TIBIO-PERONEAL TRUNK AND POSTERIOR TIBIALIS, INTRAOPERATIVE ARTERIOGRAM.;  Surgeon: Suarez Misty, MD;  Location: [facility] OR;  Service: Vascular;  Laterality: Right;  . Femoral-tibial bypass graft Right 11/17/2013    Procedure: BYPASS GRAFT RIGHT ABOVE KNEE POPLITEAL TO POSTERIOR TIBIAL ARTERY USING RIGHT NON-REVERSED CEPHALIC VEIN;  Surgeon: Suarez Misty, MD;  Location: [place];  Service: Vascular;  Laterality: Right;  . Vein harvest Right 11/17/2013    Procedure: HARVEST OF RIGHT UPPER EXTREMITY CEPHALIC VEIN;  Surgeon: Suarez Misty, MD;  Location: [place];  Service: Vascular;  Laterality: Right;  . Intraoperative arteriogram Right 11/17/2013    Procedure: INTRA OPERATIVE ARTERIOGRAM;  Surgeon:  Suarez Misty, MD;  Location: [place];  Service: Vascular;  Laterality: Right;  . Abdominal aortagram N/A 08/09/2013    Procedure: ABDOMINAL Maxcine Ham;  Surgeon: Cody Hampshire, MD;  Location: [place] CATH LAB;  Service: Cardiovascular;  Laterality: N/A;   Social History:  reports that he has never smoked. He has never used smokeless tobacco. He reports that he drinks about 1.8 oz of alcohol per week. He reports that he does not use illicit drugs.  Allergies  Allergen Reactions  . Amlodipine Swelling    Right LE edema    Family History  Problem  Relation Age of Onset  . Heart disease Mother   . Emphysema Father      Prior to Admission medications   Medication Sig Start Date End Date Taking? Authorizing Provider  Budesonide (UCERIS) 9 MG TB24 Take 9 mg by mouth daily.    Yes Historical Provider, MD  carvedilol (COREG) 6.25 MG tablet Take 1 tablet (6.25 mg total) by mouth 2 (two) times daily. 05/28/14  Yes Cody Hampshire, MD  HYDROcodone-acetaminophen (NORCO/VICODIN) 5-325 MG per tablet Take 1 tablet by mouth every 6 (six) hours as needed for moderate pain (Takes one tablet by mouth as needed for moderate pain).   Yes Historical Provider, MD  losartan (COZAAR) 100 MG tablet Take 1 tablet (100 mg total) by mouth daily. 01/24/14  Yes Cody Hampshire, MD  ondansetron (ZOFRAN ODT) 4 MG disintegrating tablet Take 1 tablet (4 mg total) by mouth every 8 (eight) hours as needed for nausea or vomiting. 06/24/14  Yes Glendell Docker, NP  rivaroxaban (XARELTO) 20 MG TABS tablet Take 1 tablet (20 mg total) by mouth daily with breakfast. 10/03/13  Yes Suarez Misty, MD  spironolactone (ALDACTONE) 25 MG tablet Take 1 tablet (25 mg total) by mouth daily. 05/29/14  Yes Cody Hampshire, MD  acetaminophen (TYLENOL) 500 MG tablet Take 1,000 mg by mouth 3 (three) times daily as needed (pain).    Historical Provider, MD  sildenafil (VIAGRA) 50 MG tablet Take 50 mg by mouth daily as needed for erectile dysfunction.    Historical Provider, MD   Physical Exam: Filed Vitals:   09/06/14 2009 09/06/14 2020 09/06/14 2135 09/06/14 2237  BP: 162/80 170/83 143/79 156/79  Pulse: 92 93 87 95  Temp: 98.1 F (36.7 C)  98.1 F (36.7 C) 98.2 F (36.8 C)  TempSrc: Oral  Oral Oral  Resp: 18 18 16 18   Height:      Weight:      SpO2: 95% 96% 96% 97%    Wt Readings from Last 3 Encounters:  09/06/14 87.544 kg (193 lb)  07/23/14 89.994 kg (198 lb 6.4 oz)  07/17/14 88.905 kg (196 lb)    General:  Appears calm and comfortable Eyes: PERRL, normal lids, irises &  conjunctiva ENT: grossly normal hearing, lips & tongue Neck: no LAD, masses or thyromegaly Cardiovascular: RRR, no m/r/g Respiratory: CTA bilaterally, no w/r/r. Normal respiratory effort. Abdomen: soft, non-tender now he states morphine relieved the pain Skin: no rash or induration seen on limited exam Musculoskeletal: grossly normal tone BUE/BLE Psychiatric: grossly normal mood and affect Neurologic: grossly non-focal.          Labs on Admission:  Basic Metabolic Panel:  Recent Labs Lab 09/06/14 1700  NA 135  K 3.9  CL 99*  CO2 25  GLUCOSE 134*  BUN 19  CREATININE 1.17  CALCIUM 9.7   Liver Function Tests:  Recent Labs Lab 09/06/14  1700  AST 23  ALT 24  ALKPHOS 35*  BILITOT 0.8  PROT 7.1  ALBUMIN 3.8    Recent Labs Lab 09/06/14 1700  LIPASE 15*   No results for input(s): AMMONIA in the last 168 hours. CBC:  Recent Labs Lab 09/06/14 1700  WBC 14.1*  NEUTROABS 10.8*  HGB 14.2  HCT 41.8  MCV 92.9  PLT 304   Cardiac Enzymes:  Recent Labs Lab 09/06/14 1700  TROPONINI <0.03    BNP (last 3 results) No results for input(s): BNP in the last 8760 hours.  ProBNP (last 3 results) No results for input(s): PROBNP in the last 8760 hours.  CBG: No results for input(s): GLUCAP in the last 168 hours.  Radiological Exams on Admission: Ct Abdomen Pelvis W Contrast  09/06/2014   CLINICAL DATA:  Acute onset of mid upper abdominal pain, nausea and vomiting. Initial encounter.  EXAM: CT ABDOMEN AND PELVIS WITH CONTRAST  TECHNIQUE: Multidetector CT imaging of the abdomen and pelvis was performed using the standard protocol following bolus administration of intravenous contrast.  CONTRAST:  164m OMNIPAQUE IOHEXOL 300 MG/ML  SOLN  COMPARISON:  CT of the chest, abdomen and pelvis from 07/18/2014  FINDINGS: Mild bibasilar atelectasis is noted.  The liver and spleen are unremarkable in appearance. Mild wall thickening and stranding is noted about the gallbladder,  raising question for mild acute cholecystitis. The pancreas and adrenal glands are unremarkable.  Nonobstructing right renal stones are seen, measuring up to 5 mm in size. Mild nonspecific perinephric stranding is noted bilaterally. The kidneys are otherwise unremarkable. There is no evidence of hydronephrosis. No obstructing ureteral stones are seen.  No free fluid is identified. The small bowel is unremarkable in appearance. The stomach is within normal limits. No acute vascular abnormalities are seen. Scattered calcification is noted along the abdominal aorta and its branches.  The appendix is normal in caliber, without evidence of appendicitis. Postoperative change is noted at the descending colon. The colon is largely decompressed.  The bladder is mildly distended and grossly unremarkable. The prostate remains normal in size. No inguinal lymphadenopathy is seen.  No acute osseous abnormalities are identified. Vacuum phenomenon is noted at L5-S1.  IMPRESSION: 1. Mild wall thickening and stranding about the gallbladder, raising question for mild acute cholecystitis. 2. Nonobstructing right renal stones measure up to 5 mm in size. 3. Mild bibasilar atelectasis noted. 4. Scattered calcification along the abdominal aorta and its branches.   Electronically Signed   By: JGarald BaldingM.D.   On: 09/06/2014 19:53   [place]dom[place] Limited  09/06/2014   CLINICAL DATA:  Epigastric abdominal pain for 12 hours, abnormal gallbladder at prior imaging  EXAM: [place] LIMITED - RIGHT UPPER QUADRANT  COMPARISON:  CT same date  FINDINGS: Gallbladder:  Gallbladder wall thickness 3 mm. No pericholecystic fluid. Dependent sludge. Gallbladder is distended.  Common bile duct:  Diameter: 2 mm  Liver:  Hepatic increased echogenicity likely indicates underlying steatosis without focal abnormality or intrahepatic ductal dilatation.  IMPRESSION: Gallbladder distention, wall thickness at upper limits of normal, and dependent sludge, which  may be seen with acute cholecystitis.  Hepatic increased echogenicity likely indicates underlying steatosis without focal abnormality or intrahepatic ductal dilatation.   Electronically Signed   By: GConchita ParisM.D.   On: 09/06/2014 21:59   Dg Abd Acute W/chest  09/06/2014   CLINICAL DATA:  Acute onset of epigastric abdominal pain, nausea and vomiting earlier today. Personal history of colon cancer and abdominal  wall hernia.  EXAM: DG ABDOMEN ACUTE W/ 1V CHEST  COMPARISON:  CT abdomen and pelvis 07/18/2014. Two-view chest x-ray 09/26/2013.  FINDINGS: Bowel gas pattern unremarkable without evidence of obstruction or significant ileus. No evidence of free air or significant air-fluid levels on the erect image. Expected stool burden in the colon. Surgical anastomotic suture material in the left upper quadrant. Phleboliths low in the left side of the pelvis. No visible opaque urinary tract calculi. Slight thoracolumbar scoliosis convex right and mild degenerative changes involving the thoracic and lumbar spine.  Cardiac silhouette normal in size, unchanged. Mild atherosclerosis involving the thoracic aorta, unchanged. Hilar and mediastinal contours otherwise unremarkable. Stable chronic elevation of the right hemidiaphragm. Linear atelectasis at the left lung base. Lungs otherwise clear. No pleural effusions.  IMPRESSION: 1. No acute abdominal abnormality. 2. Mild linear atelectasis at the left lung base. No acute cardiopulmonary disease otherwise.   Electronically Signed   By: Evangeline Dakin M.D.   On: 09/06/2014 17:55      Assessment/Plan Principal Problem:   Cholecystitis Active Problems:   Hypertension   Anemia of chronic disease   Hyperglycemia   Acute cholecystitis   1. Acute Cholecystitis -will get a Surgery consult they are aware of patient -will start on empiric zosyn for now -start on IVF -will try to advance diet as tolerated -Pain control  2. HTN -will continue with losartan  Aldactone and coreg. -monitor pressures  3. Anemia Chronic -will check iron studies -B12 folate levels  4. Hyperglycemia -will check A1C -will monitor FSBS SSI as needed  5. Peripheral Vascular Disease with history of thrombus -appears to be high risk would place on IV heparin for now hold xaralto and await surgery opinion -may need Vascular surgery evaluation  6. Colon Cancer -patient being followed in the oncology clinic     Code Status: Full Code (must indicate code status--if unknown or must be presumed, indicate so) DVT Prophylaxis:SCD Family Communication: none (indicate person spoken with, if applicable, with phone number if by telephone) Disposition Plan: home (indicate anticipated LOS)    [facility] Pager [phone]

## 2014-09-06 NOTE — ED Provider Notes (Signed)
CSN: [id number]     Arrival date & time 09/06/14  1630 History   First MD Initiated Contact with Patient 09/06/14 1644     Chief Complaint  Patient presents with  . Abdominal Pain  . Emesis     (Consider location/radiation/quality/duration/timing/severity/associated sxs/prior Treatment) HPI Comments: Patient reports epigastric abdominal pain since 8:00 this morning. Pain is constant. Nothing makes it better or worse. Denies any blood in his vomit. He's never had this pain before. No diarrhea or constipation. He's had about 3 episodes of vomiting. No chest pain or shortness of breath. History of colon cancer status post resection, ulcerative colitis, PVD on xarelto. Denies any blood in stool. Denies having this pain in the past. He still has a gallbladder. Poor appetite today. Denies any history of ulcers or acid reflux.  The history is provided by the patient and the spouse.    Past Medical History  Diagnosis Date  . Diarrhea   . Hypertension   . Ulcerative colitis     history  . Anemia of chronic disease 2015    "related to cancer tx" (08/09/2013)  . Arthritis     "thumb joints" (08/09/2013)  . Kidney stones X 2    "passed them both"  . Peripheral vascular disease   . Colon cancer 03/2013   Past Surgical History  Procedure Laterality Date  . Vasectomy    . Laparoscopic right hemi colectomy N/A 03/28/2013    Procedure: LAPAROSCOPIC ASSISTED HEMI COLECTOMY;  Surgeon: Pedro Earls, MD;  Location: [facility];  Service: General;  Laterality: N/A;  . Colon surgery    . Lower extremity angiogram Bilateral 08/09/2013  . Tonsillectomy  ~ 1950  . Hernia repair  ~ 1995; 03/2013    UHR  . Umbilical hernia repair  1995; 03/2013  . Tee without cardioversion N/A 08/10/2013    Procedure: TRANSESOPHAGEAL ECHOCARDIOGRAM (TEE);  Surgeon: Candee Furbish, MD;  Location: [facility] ENDOSCOPY;  Service: Cardiovascular;  Laterality: N/A;  . Femoral-popliteal bypass graft Right 08/11/2013    Procedure:   RIGHT -  POPLITEAL TO PERONEAL ARTERY BYPASS GRAFT  WITH NONREVERSED SAPHENOUS VEIN GRAFT,tHROMBECTOMY ANTERIOR TIBIALIS,ATTEMPTED THROMBECTOMY TIBIO-PERONEAL TRUNK AND POSTERIOR TIBIALIS, INTRAOPERATIVE ARTERIOGRAM.;  Surgeon: Mal Misty, MD;  Location: [facility] OR;  Service: Vascular;  Laterality: Right;  . Femoral-tibial bypass graft Right 11/17/2013    Procedure: BYPASS GRAFT RIGHT ABOVE KNEE POPLITEAL TO POSTERIOR TIBIAL ARTERY USING RIGHT NON-REVERSED CEPHALIC VEIN;  Surgeon: Mal Misty, MD;  Location: [place];  Service: Vascular;  Laterality: Right;  . Vein harvest Right 11/17/2013    Procedure: HARVEST OF RIGHT UPPER EXTREMITY CEPHALIC VEIN;  Surgeon: Mal Misty, MD;  Location: [place];  Service: Vascular;  Laterality: Right;  . Intraoperative arteriogram Right 11/17/2013    Procedure: INTRA OPERATIVE ARTERIOGRAM;  Surgeon: Mal Misty, MD;  Location: [place];  Service: Vascular;  Laterality: Right;  . Abdominal aortagram N/A 08/09/2013    Procedure: ABDOMINAL Maxcine Ham;  Surgeon: [name], MD;  Location: [facility] CATH LAB;  Service: Cardiovascular;  Laterality: N/A;   Family History  Problem Relation Age of Onset  . Heart disease Mother   . Emphysema Father    Social History  Substance Use Topics  . Smoking status: Never Smoker   . Smokeless tobacco: Never Used  . Alcohol Use: 1.8 oz/week    3 Glasses of wine per week    Review of Systems  Constitutional: Positive for activity change and appetite change. Negative for fever.  HENT: Negative for congestion and rhinorrhea.   Respiratory: Negative for cough, chest tightness and shortness of breath.   Cardiovascular: Negative for chest pain.  Gastrointestinal: Positive for nausea, vomiting and abdominal pain. Negative for constipation.  Genitourinary: Negative for dysuria, hematuria and testicular pain.  Musculoskeletal: Negative for myalgias, back pain and arthralgias.  Skin: Negative for rash.  Neurological: Negative for dizziness,  weakness and headaches.  A complete 10 system review of systems was obtained and all systems are negative except as noted in the HPI and PMH.      Allergies  Amlodipine  Home Medications   Prior to Admission medications   Medication Sig Start Date End Date Taking? Authorizing Provider  acetaminophen (TYLENOL) 500 MG tablet Take 1,000 mg by mouth 3 (three) times daily as needed (pain).   Yes Historical Provider, MD  Budesonide (UCERIS) 9 MG TB24 Take 9 mg by mouth daily.    Yes Historical Provider, MD  carvedilol (COREG) 6.25 MG tablet Take 1 tablet (6.25 mg total) by mouth 2 (two) times daily. 05/28/14  Yes [name], MD  diphenhydramine-acetaminophen (TYLENOL PM) 25-500 MG TABS Take 1 tablet by mouth at bedtime as needed.   Yes Historical Provider, MD  HYDROcodone-acetaminophen (NORCO/VICODIN) 5-325 MG per tablet Take 1 tablet by mouth every 6 (six) hours as needed for moderate pain (Takes one tablet by mouth as needed for moderate pain).   Yes Historical Provider, MD  losartan (COZAAR) 100 MG tablet Take 1 tablet (100 mg total) by mouth daily. 01/24/14  Yes [name], MD  ondansetron (ZOFRAN ODT) 4 MG disintegrating tablet Take 1 tablet (4 mg total) by mouth every 8 (eight) hours as needed for nausea or vomiting. 06/24/14  Yes Glendell Docker, NP  OVER THE COUNTER MEDICATION    Yes Historical Provider, MD  rivaroxaban (XARELTO) 20 MG TABS tablet Take 1 tablet (20 mg total) by mouth daily with breakfast. 10/03/13  Yes Mal Misty, MD  spironolactone (ALDACTONE) 25 MG tablet Take 1 tablet (25 mg total) by mouth daily. 05/29/14  Yes [name], MD   BP 156/79 mmHg  Pulse 95  Temp(Src) 98.2 F (36.8 C) (Oral)  Resp 18  Ht 5' 10"  (1.778 m)  Wt 193 lb (87.544 kg)  BMI 27.69 kg/m2  SpO2 97% Physical Exam  Constitutional: He is oriented to person, place, and time. He appears well-developed and well-nourished. No distress.  HENT:  Head: Normocephalic and atraumatic.   Mouth/Throat: Oropharynx is clear and moist. No oropharyngeal exudate.  Eyes: Conjunctivae and EOM are normal. Pupils are equal, round, and reactive to light.  Neck: Normal range of motion. Neck supple.  No meningismus.  Cardiovascular: Normal rate, regular rhythm, normal heart sounds and intact distal pulses.   No murmur heard. Equal femoral pulses.  Unable to palpate DP or PT pulses, chronic per patient.  Pulmonary/Chest: Effort normal and breath sounds normal. No respiratory distress.  Abdominal: Soft. There is tenderness. There is no rebound and no guarding.  Epigastric tenderness with guarding. No right upper quadrant tenderness.  Musculoskeletal: Normal range of motion. He exhibits no edema or tenderness.  No CVAT  Neurological: He is alert and oriented to person, place, and time. No cranial nerve deficit. He exhibits normal muscle tone. Coordination normal.  No ataxia on finger to nose bilaterally. No pronator drift. 5/5 strength throughout. CN 2-12 intact. Negative Romberg. Equal grip strength. Sensation intact. Gait is normal.   Skin: Skin is warm.  Psychiatric: He  has a normal mood and affect. His behavior is normal.  Nursing note and vitals reviewed.   ED Course  Procedures (including critical care time) Labs Review Labs Reviewed  CBC WITH DIFFERENTIAL/PLATELET - Abnormal; Notable for the following:    WBC 14.1 (*)    Neutro Abs 10.8 (*)    Monocytes Absolute 1.4 (*)    All other components within normal limits  COMPREHENSIVE METABOLIC PANEL - Abnormal; Notable for the following:    Chloride 99 (*)    Glucose, Bld 134 (*)    Alkaline Phosphatase 35 (*)    All other components within normal limits  LIPASE, BLOOD - Abnormal; Notable for the following:    Lipase 15 (*)    All other components within normal limits  TROPONIN I  URINALYSIS, ROUTINE W REFLEX MICROSCOPIC (NOT AT [facility])  FERRITIN  IRON AND TIBC  VITAMIN B12  FOLATE RBC  HEMOGLOBIN A1C  TSH  CBC   PROTIME-INR  APTT  COMPREHENSIVE METABOLIC PANEL  I-STAT CG4 LACTIC ACID, ED  I-STAT CG4 LACTIC ACID, ED    Imaging Review Ct Abdomen Pelvis W Contrast  09/06/2014   CLINICAL DATA:  Acute onset of mid upper abdominal pain, nausea and vomiting. Initial encounter.  EXAM: CT ABDOMEN AND PELVIS WITH CONTRAST  TECHNIQUE: Multidetector CT imaging of the abdomen and pelvis was performed using the standard protocol following bolus administration of intravenous contrast.  CONTRAST:  169m OMNIPAQUE IOHEXOL 300 MG/ML  SOLN  COMPARISON:  CT of the chest, abdomen and pelvis from 07/18/2014  FINDINGS: Mild bibasilar atelectasis is noted.  The liver and spleen are unremarkable in appearance. Mild wall thickening and stranding is noted about the gallbladder, raising question for mild acute cholecystitis. The pancreas and adrenal glands are unremarkable.  Nonobstructing right renal stones are seen, measuring up to 5 mm in size. Mild nonspecific perinephric stranding is noted bilaterally. The kidneys are otherwise unremarkable. There is no evidence of hydronephrosis. No obstructing ureteral stones are seen.  No free fluid is identified. The small bowel is unremarkable in appearance. The stomach is within normal limits. No acute vascular abnormalities are seen. Scattered calcification is noted along the abdominal aorta and its branches.  The appendix is normal in caliber, without evidence of appendicitis. Postoperative change is noted at the descending colon. The colon is largely decompressed.  The bladder is mildly distended and grossly unremarkable. The prostate remains normal in size. No inguinal lymphadenopathy is seen.  No acute osseous abnormalities are identified. Vacuum phenomenon is noted at L5-S1.  IMPRESSION: 1. Mild wall thickening and stranding about the gallbladder, raising question for mild acute cholecystitis. 2. Nonobstructing right renal stones measure up to 5 mm in size. 3. Mild bibasilar atelectasis  noted. 4. Scattered calcification along the abdominal aorta and its branches.   Electronically Signed   By: JGarald BaldingM.D.   On: 09/06/2014 19:53   [place]dom[place] Limited  09/06/2014   CLINICAL DATA:  Epigastric abdominal pain for 12 hours, abnormal gallbladder at prior imaging  EXAM: [place] LIMITED - RIGHT UPPER QUADRANT  COMPARISON:  CT same date  FINDINGS: Gallbladder:  Gallbladder wall thickness 3 mm. No pericholecystic fluid. Dependent sludge. Gallbladder is distended.  Common bile duct:  Diameter: 2 mm  Liver:  Hepatic increased echogenicity likely indicates underlying steatosis without focal abnormality or intrahepatic ductal dilatation.  IMPRESSION: Gallbladder distention, wall thickness at upper limits of normal, and dependent sludge, which may be seen with acute cholecystitis.  Hepatic increased  echogenicity likely indicates underlying steatosis without focal abnormality or intrahepatic ductal dilatation.   Electronically Signed   By: Conchita Paris M.D.   On: 09/06/2014 21:59   Dg Abd Acute W/chest  09/06/2014   CLINICAL DATA:  Acute onset of epigastric abdominal pain, nausea and vomiting earlier today. Personal history of colon cancer and abdominal wall hernia.  EXAM: DG ABDOMEN ACUTE W/ 1V CHEST  COMPARISON:  CT abdomen and pelvis 07/18/2014. Two-view chest x-ray 09/26/2013.  FINDINGS: Bowel gas pattern unremarkable without evidence of obstruction or significant ileus. No evidence of free air or significant air-fluid levels on the erect image. Expected stool burden in the colon. Surgical anastomotic suture material in the left upper quadrant. Phleboliths low in the left side of the pelvis. No visible opaque urinary tract calculi. Slight thoracolumbar scoliosis convex right and mild degenerative changes involving the thoracic and lumbar spine.  Cardiac silhouette normal in size, unchanged. Mild atherosclerosis involving the thoracic aorta, unchanged. Hilar and mediastinal contours otherwise  unremarkable. Stable chronic elevation of the right hemidiaphragm. Linear atelectasis at the left lung base. Lungs otherwise clear. No pleural effusions.  IMPRESSION: 1. No acute abdominal abnormality. 2. Mild linear atelectasis at the left lung base. No acute cardiopulmonary disease otherwise.   Electronically Signed   By: Evangeline Dakin M.D.   On: 09/06/2014 17:55   I have personally reviewed and evaluated these images and lab results as part of my medical decision-making.   EKG Interpretation   Date/Time:  Thursday September 06 2014 17:06:20 EDT Ventricular Rate:  71 PR Interval:  152 QRS Duration: 82 QT Interval:  418 QTC Calculation: 454 R Axis:   83 Text Interpretation:  Sinus rhythm with marked sinus arrhythmia Possible  Anterior infarct , age undetermined Abnormal ECG No significant change was  found Confirmed by Wyvonnia Dusky  MD, [place] [id number]) on 09/06/2014 5:15:06 PM      MDM   Final diagnoses:  Abdominal pain  Cholecystitis   Epigastric abdominal pain with nausea and vomiting. Patient afebrile. LFTs and lipase normal. Lactate normal. EKG reassuring.  WBC elevated. Unable to palpate pulses in feet, chronic per patient.  CT shows possible evidence of cholecystitis. Discussed with surgery Dr. Clarise Cruz. Patient has complicated Medical history and is anticoagulated on xarelto.  He recommended medical admission. IV Zosyn started. Discussed with Dr. Posey Pronto at [facility] who will admit patient.     Ezequiel Essex, MD 09/07/14 [phone]

## 2014-09-06 NOTE — ED Notes (Signed)
MD at bedside. 

## 2014-09-06 NOTE — ED Notes (Signed)
Unable to doppler pulses

## 2014-09-06 NOTE — Consult Note (Signed)
Reason for Consult:  Epigastric pain, acute cholecystitis Referring Physician:   Dr. Jodene Nam GUILHERME SCHWENKE is an 69 y.o. male.  HPI: This is 69 year old male with the abrupt onset of sharp epigastric pain early this morning. The pain persisted and became more severe so he left work. He had nausea and vomiting. He subsequently went to [facility] For evaluation. It took 2 doses of morphine to make him comfortable. CT scan suggested findings consistent with acute cholecystitis. Ultrasound was less convincing. Its that showed some gallbladder sludge and borderline wall thickening but no pericholecystic fluid. No fever or chills. He has not had pain like this before. He was transferred here for further evaluation and treatment.  Past Medical History  Diagnosis Date  . Diarrhea   . Hypertension   . Ulcerative colitis     history  . Anemia of chronic disease 2015    "related to cancer tx" (08/09/2013)  . Arthritis     "thumb joints" (08/09/2013)  . Kidney stones X 2    "passed them both"  . Peripheral vascular disease   . Colon cancer 03/2013    Past Surgical History  Procedure Laterality Date  . Vasectomy    . Laparoscopic right hemi colectomy N/A 03/28/2013    Procedure: LAPAROSCOPIC ASSISTED HEMI COLECTOMY;  Surgeon: Pedro Earls, MD;  Location: [facility] ORS;  Service: General;  Laterality: N/A;  . Colon surgery    . Lower extremity angiogram Bilateral 08/09/2013  . Tonsillectomy  ~ 1950  . Hernia repair  ~ 1995; 03/2013    UHR  . Umbilical hernia repair  1995; 03/2013  . Tee without cardioversion N/A 08/10/2013    Procedure: TRANSESOPHAGEAL ECHOCARDIOGRAM (TEE);  Surgeon: Candee Furbish, MD;  Location: [facility] ENDOSCOPY;  Service: Cardiovascular;  Laterality: N/A;  . Femoral-popliteal bypass graft Right 08/11/2013    Procedure:   RIGHT - POPLITEAL TO PERONEAL ARTERY BYPASS GRAFT  WITH NONREVERSED SAPHENOUS VEIN GRAFT,tHROMBECTOMY ANTERIOR TIBIALIS,ATTEMPTED THROMBECTOMY TIBIO-PERONEAL TRUNK AND POSTERIOR  TIBIALIS, INTRAOPERATIVE ARTERIOGRAM.;  Surgeon: Mal Misty, MD;  Location: [facility] OR;  Service: Vascular;  Laterality: Right;  . Femoral-tibial bypass graft Right 11/17/2013    Procedure: BYPASS GRAFT RIGHT ABOVE KNEE POPLITEAL TO POSTERIOR TIBIAL ARTERY USING RIGHT NON-REVERSED CEPHALIC VEIN;  Surgeon: Mal Misty, MD;  Location: [place];  Service: Vascular;  Laterality: Right;  . Vein harvest Right 11/17/2013    Procedure: HARVEST OF RIGHT UPPER EXTREMITY CEPHALIC VEIN;  Surgeon: Mal Misty, MD;  Location: [place];  Service: Vascular;  Laterality: Right;  . Intraoperative arteriogram Right 11/17/2013    Procedure: INTRA OPERATIVE ARTERIOGRAM;  Surgeon: Mal Misty, MD;  Location: [place];  Service: Vascular;  Laterality: Right;  . Abdominal aortagram N/A 08/09/2013    Procedure: ABDOMINAL Maxcine Ham;  Surgeon: [name], MD;  Location: [facility] CATH LAB;  Service: Cardiovascular;  Laterality: N/A;    Family History  Problem Relation Age of Onset  . Heart disease Mother   . Emphysema Father     Social History:  reports that he has never smoked. He has never used smokeless tobacco. He reports that he drinks about 1.8 oz of alcohol per week. He reports that he does not use illicit drugs.  Allergies:  Allergies  Allergen Reactions  . Amlodipine Swelling    Right LE edema    Prior to Admission medications   Medication Sig Start Date End Date Taking? Authorizing Provider  acetaminophen (TYLENOL) 500 MG tablet Take 1,000 mg by  mouth 3 (three) times daily as needed (pain).   Yes Historical Provider, MD  Budesonide (UCERIS) 9 MG TB24 Take 9 mg by mouth daily.    Yes Historical Provider, MD  carvedilol (COREG) 6.25 MG tablet Take 1 tablet (6.25 mg total) by mouth 2 (two) times daily. 05/28/14  Yes [name], MD  diphenhydramine-acetaminophen (TYLENOL PM) 25-500 MG TABS Take 1 tablet by mouth at bedtime as needed.   Yes Historical Provider, MD  HYDROcodone-acetaminophen  (NORCO/VICODIN) 5-325 MG per tablet Take 1 tablet by mouth every 6 (six) hours as needed for moderate pain (Takes one tablet by mouth as needed for moderate pain).   Yes Historical Provider, MD  losartan (COZAAR) 100 MG tablet Take 1 tablet (100 mg total) by mouth daily. 01/24/14  Yes [name], MD  ondansetron (ZOFRAN ODT) 4 MG disintegrating tablet Take 1 tablet (4 mg total) by mouth every 8 (eight) hours as needed for nausea or vomiting. 06/24/14  Yes Glendell Docker, NP  OVER THE COUNTER MEDICATION    Yes Historical Provider, MD  rivaroxaban (XARELTO) 20 MG TABS tablet Take 1 tablet (20 mg total) by mouth daily with breakfast. 10/03/13  Yes Mal Misty, MD  spironolactone (ALDACTONE) 25 MG tablet Take 1 tablet (25 mg total) by mouth daily. 05/29/14  Yes [name], MD    Results for orders placed or performed during the hospital encounter of 09/06/14 (from the past 48 hour(s))  CBC with Differential/Platelet     Status: Abnormal   Collection Time: 09/06/14  5:00 PM  Result Value Ref Range   WBC 14.1 (H) 4.0 - 10.5 K/uL   RBC 4.50 4.22 - 5.81 MIL/uL   Hemoglobin 14.2 13.0 - 17.0 g/dL   HCT 41.8 39.0 - 52.0 %   MCV 92.9 78.0 - 100.0 fL   MCH 31.6 26.0 - 34.0 pg   MCHC 34.0 30.0 - 36.0 g/dL   RDW 14.1 11.5 - 15.5 %   Platelets 304 150 - 400 K/uL   Neutrophils Relative % 76 43 - 77 %   Neutro Abs 10.8 (H) 1.7 - 7.7 K/uL   Lymphocytes Relative 12 12 - 46 %   Lymphs Abs 1.7 0.7 - 4.0 K/uL   Monocytes Relative 10 3 - 12 %   Monocytes Absolute 1.4 (H) 0.1 - 1.0 K/uL   Eosinophils Relative 1 0 - 5 %   Eosinophils Absolute 0.1 0.0 - 0.7 K/uL   Basophils Relative 1 0 - 1 %   Basophils Absolute 0.1 0.0 - 0.1 K/uL  Comprehensive metabolic panel     Status: Abnormal   Collection Time: 09/06/14  5:00 PM  Result Value Ref Range   Sodium 135 135 - 145 mmol/L   Potassium 3.9 3.5 - 5.1 mmol/L   Chloride 99 (L) 101 - 111 mmol/L   CO2 25 22 - 32 mmol/L   Glucose, Bld 134 (H) 65 - 99  mg/dL   BUN 19 6 - 20 mg/dL   Creatinine, Ser 1.17 0.61 - 1.24 mg/dL   Calcium 9.7 8.9 - 10.3 mg/dL   Total Protein 7.1 6.5 - 8.1 g/dL   Albumin 3.8 3.5 - 5.0 g/dL   AST 23 15 - 41 U/L   ALT 24 17 - 63 U/L   Alkaline Phosphatase 35 (L) 38 - 126 U/L   Total Bilirubin 0.8 0.3 - 1.2 mg/dL   GFR calc non Af Amer >60 >60 mL/min   GFR calc Af Amer >  60 >60 mL/min    Comment: (NOTE) The eGFR has been calculated using the CKD EPI equation. This calculation has not been validated in all clinical situations. eGFR's persistently <60 mL/min signify possible Chronic Kidney Disease.    Anion gap 11 5 - 15  Lipase, blood     Status: Abnormal   Collection Time: 09/06/14  5:00 PM  Result Value Ref Range   Lipase 15 (L) 22 - 51 U/L  Troponin I     Status: None   Collection Time: 09/06/14  5:00 PM  Result Value Ref Range   Troponin I <0.03 <0.031 ng/mL    Comment:        NO INDICATION OF MYOCARDIAL INJURY.   Urinalysis, Routine w reflex microscopic (not at [facility])     Status: None   Collection Time: 09/06/14  5:10 PM  Result Value Ref Range   Color, Urine YELLOW YELLOW   APPearance CLEAR CLEAR   Specific Gravity, Urine 1.018 1.005 - 1.030   pH 5.5 5.0 - 8.0   Glucose, UA NEGATIVE NEGATIVE mg/dL   Hgb urine dipstick NEGATIVE NEGATIVE   Bilirubin Urine NEGATIVE NEGATIVE   Ketones, ur NEGATIVE NEGATIVE mg/dL   Protein, ur NEGATIVE NEGATIVE mg/dL   Urobilinogen, UA 0.2 0.0 - 1.0 mg/dL   Nitrite NEGATIVE NEGATIVE   Leukocytes, UA NEGATIVE NEGATIVE    Comment: MICROSCOPIC NOT DONE ON URINES WITH NEGATIVE PROTEIN, BLOOD, LEUKOCYTES, NITRITE, OR GLUCOSE <1000 mg/dL.  I-Stat CG4 Lactic Acid, ED     Status: None   Collection Time: 09/06/14  5:14 PM  Result Value Ref Range   Lactic Acid, Venous 1.86 0.5 - 2.0 mmol/L  I-Stat CG4 Lactic Acid, ED     Status: None   Collection Time: 09/06/14  8:18 PM  Result Value Ref Range   Lactic Acid, Venous 1.88 0.5 - 2.0 mmol/L    Ct Abdomen Pelvis W  Contrast  09/06/2014   CLINICAL DATA:  Acute onset of mid upper abdominal pain, nausea and vomiting. Initial encounter.  EXAM: CT ABDOMEN AND PELVIS WITH CONTRAST  TECHNIQUE: Multidetector CT imaging of the abdomen and pelvis was performed using the standard protocol following bolus administration of intravenous contrast.  CONTRAST:  16m OMNIPAQUE IOHEXOL 300 MG/ML  SOLN  COMPARISON:  CT of the chest, abdomen and pelvis from 07/18/2014  FINDINGS: Mild bibasilar atelectasis is noted.  The liver and spleen are unremarkable in appearance. Mild wall thickening and stranding is noted about the gallbladder, raising question for mild acute cholecystitis. The pancreas and adrenal glands are unremarkable.  Nonobstructing right renal stones are seen, measuring up to 5 mm in size. Mild nonspecific perinephric stranding is noted bilaterally. The kidneys are otherwise unremarkable. There is no evidence of hydronephrosis. No obstructing ureteral stones are seen.  No free fluid is identified. The small bowel is unremarkable in appearance. The stomach is within normal limits. No acute vascular abnormalities are seen. Scattered calcification is noted along the abdominal aorta and its branches.  The appendix is normal in caliber, without evidence of appendicitis. Postoperative change is noted at the descending colon. The colon is largely decompressed.  The bladder is mildly distended and grossly unremarkable. The prostate remains normal in size. No inguinal lymphadenopathy is seen.  No acute osseous abnormalities are identified. Vacuum phenomenon is noted at L5-S1.  IMPRESSION: 1. Mild wall thickening and stranding about the gallbladder, raising question for mild acute cholecystitis. 2. Nonobstructing right renal stones measure up to 5 mm in size.  3. Mild bibasilar atelectasis noted. 4. Scattered calcification along the abdominal aorta and its branches.   Electronically Signed   By: Garald Balding M.D.   On: 09/06/2014 19:53   US  Abdomen Limited  09/06/2014   CLINICAL DATA:  Epigastric abdominal pain for 12 hours, abnormal gallbladder at prior imaging  EXAM: US ABDOMEN LIMITED - RIGHT UPPER QUADRANT  COMPARISON:  CT same date  FINDINGS: Gallbladder:  Gallbladder wall thickness 3 mm. No pericholecystic fluid. Dependent sludge. Gallbladder is distended.  Common bile duct:  Diameter: 2 mm  Liver:  Hepatic increased echogenicity likely indicates underlying steatosis without focal abnormality or intrahepatic ductal dilatation.  IMPRESSION: Gallbladder distention, wall thickness at upper limits of normal, and dependent sludge, which may be seen with acute cholecystitis.  Hepatic increased echogenicity likely indicates underlying steatosis without focal abnormality or intrahepatic ductal dilatation.   Electronically Signed   By: Conchita Paris M.D.   On: 09/06/2014 21:59   Dg Abd Acute W/chest  09/06/2014   CLINICAL DATA:  Acute onset of epigastric abdominal pain, nausea and vomiting earlier today. Personal history of colon cancer and abdominal wall hernia.  EXAM: DG ABDOMEN ACUTE W/ 1V CHEST  COMPARISON:  CT abdomen and pelvis 07/18/2014. Two-view chest x-ray 09/26/2013.  FINDINGS: Bowel gas pattern unremarkable without evidence of obstruction or significant ileus. No evidence of free air or significant air-fluid levels on the erect image. Expected stool burden in the colon. Surgical anastomotic suture material in the left upper quadrant. Phleboliths low in the left side of the pelvis. No visible opaque urinary tract calculi. Slight thoracolumbar scoliosis convex right and mild degenerative changes involving the thoracic and lumbar spine.  Cardiac silhouette normal in size, unchanged. Mild atherosclerosis involving the thoracic aorta, unchanged. Hilar and mediastinal contours otherwise unremarkable. Stable chronic elevation of the right hemidiaphragm. Linear atelectasis at the left lung base. Lungs otherwise clear. No pleural effusions.   IMPRESSION: 1. No acute abdominal abnormality. 2. Mild linear atelectasis at the left lung base. No acute cardiopulmonary disease otherwise.   Electronically Signed   By: Evangeline Dakin M.D.   On: 09/06/2014 17:55    Review of Systems  Constitutional: Negative for fever and chills.  Gastrointestinal: Positive for nausea, vomiting and abdominal pain.   Blood pressure 156/79, pulse 95, temperature 98.2 F (36.8 C), temperature source Oral, resp. rate 18, height 5' 10" (1.778 m), weight 87.544 kg (193 lb), SpO2 97 %. Physical Exam  Constitutional: No distress.  Overweight male.  HENT:  Head: Normocephalic and atraumatic.  Eyes: No scleral icterus.  Cardiovascular: Normal rate and regular rhythm.   GI: Soft. He exhibits no mass. There is no tenderness.  Midline scar present  Neurological: He is alert.  Skin:  No jaundice    Assessment/Plan: Acute onset of epigastric pain. CT scan suggests acute cholecystitis. Ultrasound is less convincing for this. Currently no right upper quadrant tenderness or guarding on exam.  He has been started on IV Zosyn.  Plan: Given the discordance between the 2 radiologic exams, I recommend getting a HIDA. Agree with IV antibiotics. Will order HIDA for later this morning.  Michaelyn Wall J 09/06/2014, 11:56 PM

## 2014-09-06 NOTE — ED Notes (Signed)
Abd pain since 0800. Vomited x 3. Denies diarrhea

## 2014-09-07 ENCOUNTER — Inpatient Hospital Stay (HOSPITAL_COMMUNITY): Payer: 59

## 2014-09-07 ENCOUNTER — Encounter (HOSPITAL_COMMUNITY): Payer: Self-pay | Admitting: Physician Assistant

## 2014-09-07 DIAGNOSIS — I739 Peripheral vascular disease, unspecified: Secondary | ICD-10-CM

## 2014-09-07 DIAGNOSIS — Z01818 Encounter for other preprocedural examination: Secondary | ICD-10-CM | POA: Insufficient documentation

## 2014-09-07 DIAGNOSIS — K810 Acute cholecystitis: Principal | ICD-10-CM

## 2014-09-07 LAB — COMPREHENSIVE METABOLIC PANEL
ALT: 23 U/L (ref 17–63)
AST: 23 U/L (ref 15–41)
Albumin: 3.7 g/dL (ref 3.5–5.0)
Alkaline Phosphatase: 34 U/L — ABNORMAL LOW (ref 38–126)
Anion gap: 10 (ref 5–15)
BILIRUBIN TOTAL: 1.1 mg/dL (ref 0.3–1.2)
BUN: 13 mg/dL (ref 6–20)
CO2: 26 mmol/L (ref 22–32)
Calcium: 9.2 mg/dL (ref 8.9–10.3)
Chloride: 100 mmol/L — ABNORMAL LOW (ref 101–111)
Creatinine, Ser: 0.95 mg/dL (ref 0.61–1.24)
Glucose, Bld: 127 mg/dL — ABNORMAL HIGH (ref 65–99)
POTASSIUM: 3.9 mmol/L (ref 3.5–5.1)
Sodium: 136 mmol/L (ref 135–145)
TOTAL PROTEIN: 6.7 g/dL (ref 6.5–8.1)

## 2014-09-07 LAB — TSH: TSH: 2.305 u[IU]/mL (ref 0.350–4.500)

## 2014-09-07 LAB — APTT
aPTT: 29 seconds (ref 24–37)
aPTT: 87 seconds — ABNORMAL HIGH (ref 24–37)
aPTT: 117 seconds — ABNORMAL HIGH (ref 24–37)

## 2014-09-07 LAB — CBC
HCT: 43 % (ref 39.0–52.0)
HEMOGLOBIN: 14.4 g/dL (ref 13.0–17.0)
MCH: 31.7 pg (ref 26.0–34.0)
MCHC: 33.5 g/dL (ref 30.0–36.0)
MCV: 94.7 fL (ref 78.0–100.0)
Platelets: 303 10*3/uL (ref 150–400)
RBC: 4.54 MIL/uL (ref 4.22–5.81)
RDW: 14.5 % (ref 11.5–15.5)
WBC: 13.4 10*3/uL — AB (ref 4.0–10.5)

## 2014-09-07 LAB — IRON AND TIBC
IRON: 20 ug/dL — AB (ref 45–182)
Saturation Ratios: 6 % — ABNORMAL LOW (ref 17.9–39.5)
TIBC: 339 ug/dL (ref 250–450)
UIBC: 319 ug/dL

## 2014-09-07 LAB — HEPARIN LEVEL (UNFRACTIONATED)
Heparin Unfractionated: 0.92 IU/mL — ABNORMAL HIGH (ref 0.30–0.70)
Heparin Unfractionated: 1.04 IU/mL — ABNORMAL HIGH (ref 0.30–0.70)

## 2014-09-07 LAB — GLUCOSE, CAPILLARY
GLUCOSE-CAPILLARY: 128 mg/dL — AB (ref 65–99)
GLUCOSE-CAPILLARY: 129 mg/dL — AB (ref 65–99)
GLUCOSE-CAPILLARY: 130 mg/dL — AB (ref 65–99)
GLUCOSE-CAPILLARY: 149 mg/dL — AB (ref 65–99)
Glucose-Capillary: 166 mg/dL — ABNORMAL HIGH (ref 65–99)

## 2014-09-07 LAB — PROTIME-INR
INR: 1.27 (ref 0.00–1.49)
PROTHROMBIN TIME: 16 s — AB (ref 11.6–15.2)

## 2014-09-07 LAB — SURGICAL PCR SCREEN
MRSA, PCR: NEGATIVE
Staphylococcus aureus: NEGATIVE

## 2014-09-07 LAB — VITAMIN B12: Vitamin B-12: 330 pg/mL (ref 180–914)

## 2014-09-07 LAB — FERRITIN: FERRITIN: 165 ng/mL (ref 24–336)

## 2014-09-07 MED ORDER — PIPERACILLIN-TAZOBACTAM 3.375 G IVPB
3.3750 g | Freq: Three times a day (TID) | INTRAVENOUS | Status: DC
  Administered 2014-09-07 – 2014-09-10 (×10): 3.375 g via INTRAVENOUS
  Filled 2014-09-07 (×12): qty 50

## 2014-09-07 MED ORDER — CHLORHEXIDINE GLUCONATE 4 % EX LIQD
1.0000 "application " | Freq: Once | CUTANEOUS | Status: DC
  Filled 2014-09-07: qty 15

## 2014-09-07 MED ORDER — DEXTROSE-NACL 5-0.9 % IV SOLN
INTRAVENOUS | Status: DC
  Administered 2014-09-07 (×2): via INTRAVENOUS

## 2014-09-07 MED ORDER — VITAMIN B-1 100 MG PO TABS
100.0000 mg | ORAL_TABLET | Freq: Every day | ORAL | Status: DC
  Administered 2014-09-07 – 2014-09-10 (×3): 100 mg via ORAL
  Filled 2014-09-07 (×4): qty 1

## 2014-09-07 MED ORDER — ONDANSETRON HCL 4 MG PO TABS: 4.0000 mg | ORAL_TABLET | Freq: Four times a day (QID) | ORAL | Status: DC | PRN

## 2014-09-07 MED ORDER — MORPHINE SULFATE (PF) 4 MG/ML IV SOLN
3.0000 mg | Freq: Once | INTRAVENOUS | Status: AC
  Administered 2014-09-07: 3 mg via INTRAVENOUS

## 2014-09-07 MED ORDER — CHLORHEXIDINE GLUCONATE 4 % EX LIQD
1.0000 "application " | Freq: Once | CUTANEOUS | Status: AC
  Administered 2014-09-07: 1 via TOPICAL
  Filled 2014-09-07: qty 15

## 2014-09-07 MED ORDER — ACETAMINOPHEN 500 MG PO TABS: 1000.0000 mg | ORAL_TABLET | Freq: Three times a day (TID) | ORAL | Status: DC | PRN

## 2014-09-07 MED ORDER — CARVEDILOL 6.25 MG PO TABS
6.2500 mg | ORAL_TABLET | Freq: Two times a day (BID) | ORAL | Status: DC
  Administered 2014-09-07 – 2014-09-10 (×6): 6.25 mg via ORAL
  Filled 2014-09-07 (×10): qty 1

## 2014-09-07 MED ORDER — HYDROMORPHONE HCL 1 MG/ML IJ SOLN: 0.5000 mg | INTRAMUSCULAR | Status: DC | PRN

## 2014-09-07 MED ORDER — ADULT MULTIVITAMIN W/MINERALS CH
1.0000 | ORAL_TABLET | Freq: Every day | ORAL | Status: DC
  Administered 2014-09-07 – 2014-09-10 (×3): 1 via ORAL
  Filled 2014-09-07 (×4): qty 1

## 2014-09-07 MED ORDER — HEPARIN (PORCINE) IN NACL 100-0.45 UNIT/ML-% IJ SOLN
1400.0000 [IU]/h | INTRAMUSCULAR | Status: DC
Start: 2014-09-07 — End: 2014-09-07
  Administered 2014-09-07: 1400 [IU]/h via INTRAVENOUS
  Filled 2014-09-07 (×2): qty 250

## 2014-09-07 MED ORDER — TECHNETIUM TC 99M MEBROFENIN IV KIT
5.0400 | PACK | Freq: Once | INTRAVENOUS | Status: DC | PRN
  Administered 2014-09-07: 5.04 via INTRAVENOUS
  Filled 2014-09-07: qty 6

## 2014-09-07 MED ORDER — SPIRONOLACTONE 25 MG PO TABS
25.0000 mg | ORAL_TABLET | Freq: Every day | ORAL | Status: DC
Start: 2014-09-07 — End: 2014-09-08
  Administered 2014-09-07: 25 mg via ORAL
  Filled 2014-09-07 (×2): qty 1

## 2014-09-07 MED ORDER — BUDESONIDE 3 MG PO CPEP
9.0000 mg | ORAL_CAPSULE | Freq: Every day | ORAL | Status: DC
  Administered 2014-09-07 – 2014-09-10 (×3): 9 mg via ORAL
  Filled 2014-09-07 (×4): qty 3

## 2014-09-07 MED ORDER — ONDANSETRON HCL 4 MG/2ML IJ SOLN: 4.0000 mg | Freq: Four times a day (QID) | INTRAMUSCULAR | Status: DC | PRN

## 2014-09-07 MED ORDER — MORPHINE SULFATE (PF) 4 MG/ML IV SOLN
INTRAVENOUS | Status: AC
  Filled 2014-09-07: qty 1

## 2014-09-07 MED ORDER — HEPARIN (PORCINE) IN NACL 100-0.45 UNIT/ML-% IJ SOLN
1300.0000 [IU]/h | INTRAMUSCULAR | Status: DC
  Administered 2014-09-08: 1300 [IU]/h via INTRAVENOUS
  Filled 2014-09-07 (×2): qty 250

## 2014-09-07 MED ORDER — DOCUSATE SODIUM 100 MG PO CAPS
100.0000 mg | ORAL_CAPSULE | Freq: Two times a day (BID) | ORAL | Status: DC
  Administered 2014-09-09 – 2014-09-10 (×2): 100 mg via ORAL
  Filled 2014-09-07 (×9): qty 1

## 2014-09-07 MED ORDER — LOSARTAN POTASSIUM 50 MG PO TABS
100.0000 mg | ORAL_TABLET | Freq: Every day | ORAL | Status: DC
  Administered 2014-09-07: 100 mg via ORAL
  Filled 2014-09-07 (×2): qty 2

## 2014-09-07 MED ORDER — FOLIC ACID 1 MG PO TABS
1.0000 mg | ORAL_TABLET | Freq: Every day | ORAL | Status: DC
  Administered 2014-09-07 – 2014-09-10 (×3): 1 mg via ORAL
  Filled 2014-09-07 (×4): qty 1

## 2014-09-07 NOTE — Progress Notes (Signed)
ANTICOAGULATION CONSULT NOTE - Follow Up Consult  Pharmacy Consult for Heparin Indication: Hx of DVT/PVD, Xarelto held  Allergies  Allergen Reactions  . Amlodipine Swelling    Right LE edema    Patient Measurements: Height: 5' 10"  (177.8 cm) Weight: 193 lb (87.544 kg) IBW/kg (Calculated) : 73 Heparin Dosing Weight: 87.5 kg  Vital Signs: Temp: 98.3 F (36.8 C) (09/02 1437) Temp Source: Oral (09/02 1437) BP: 128/67 mmHg (09/02 1437) Pulse Rate: 77 (09/02 1437)  Labs:  Recent Labs  09/06/14 1700 09/07/14 0530  HGB 14.2 14.4  HCT 41.8 43.0  PLT 304 303  APTT  --  29  LABPROT  --  16.0*  INR  --  1.27  CREATININE 1.17 0.95  TROPONINI <0.03  --     Estimated Creatinine Clearance: 75.8 mL/min (by C-G formula based on Cr of 0.95).   Medications:  Infusions:  . dextrose 5 % and 0.9% NaCl 50 mL/hr at 09/07/14 0040  . heparin 1,400 Units/hr (09/07/14 3149)    Assessment: 28 yoM admitted 9/1 with epigastric pain, possible acute cholecystitis.  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  For possible surgery, Xarelto has been placed on hold (last dose taken 9/1 at 0630).  Pharmacy is consulted to dose Heparin IV bridge.  Today, 09/07/2014:  CBC:  Hgb 14.4, Plt 303  APTT = 87, therapeutic   Heparin level = 0.92 , expected supratherapeutic value d/t interaction with recent Xarelto use.  Continue to monitor both HL and APTT while Xarelto is cleared.  RN reports no bleeding or complications.   Goal of Therapy:  Heparin level 0.3-0.7 units/ml aPTT 66-102 seconds Monitor platelets by anticoagulation protocol: Yes   Plan:   Continue heparin IV infusion at 1400 units/hr  Recheck APTT and Heparin level in 6 hours  Daily APTT, heparin level, and CBC  Continue to monitor H&H and platelets  Pt is now scheduled for surgery on 9/3 AM, timing TBD.  RN to f/u plans and stop Heparin drip prior to procedure per surgery orders.  Gretta Arab PharmD,  BCPS Pager [phone] 09/07/2014 3:49 PM

## 2014-09-07 NOTE — Progress Notes (Signed)
ANTICOAGULATION CONSULT NOTE - Initial Consult  Pharmacy Consult for Heparin Indication: bridging for rivaroxaban  Allergies  Allergen Reactions  . Amlodipine Swelling    Right LE edema    Patient Measurements: Height: 5' 10"  (177.8 cm) Weight: 193 lb (87.544 kg) IBW/kg (Calculated) : 73 Heparin Dosing Weight:   Vital Signs: Temp: 98.2 F (36.8 C) (09/01 2237) Temp Source: Oral (09/01 2237) BP: 156/79 mmHg (09/01 2237) Pulse Rate: 95 (09/01 2237)  Labs:  Recent Labs  09/06/14 1700  HGB 14.2  HCT 41.8  PLT 304  CREATININE 1.17  TROPONINI <0.03    Estimated Creatinine Clearance: 61.5 mL/min (by C-G formula based on Cr of 1.17).   Medical History: Past Medical History  Diagnosis Date  . Diarrhea   . Hypertension   . Ulcerative colitis     history  . Anemia of chronic disease 2015    "related to cancer tx" (08/09/2013)  . Arthritis     "thumb joints" (08/09/2013)  . Kidney stones X 2    "passed them both"  . Peripheral vascular disease   . Colon cancer 03/2013    Medications:  Prescriptions prior to admission  Medication Sig Dispense Refill Last Dose  . acetaminophen (TYLENOL) 500 MG tablet Take 1,000 mg by mouth 3 (three) times daily as needed (pain).   Past Month at Unknown time  . Budesonide (UCERIS) 9 MG TB24 Take 9 mg by mouth daily.    09/06/2014 at Unknown time  . carvedilol (COREG) 6.25 MG tablet Take 1 tablet (6.25 mg total) by mouth 2 (two) times daily.   09/06/2014 at 0630  . diphenhydramine-acetaminophen (TYLENOL PM) 25-500 MG TABS Take 1 tablet by mouth at bedtime as needed.   09/05/2014 at Unknown time  . HYDROcodone-acetaminophen (NORCO/VICODIN) 5-325 MG per tablet Take 1 tablet by mouth every 6 (six) hours as needed for moderate pain (Takes one tablet by mouth as needed for moderate pain).   09/06/2014 at Unknown time  . losartan (COZAAR) 100 MG tablet Take 1 tablet (100 mg total) by mouth daily. 90 tablet 3 09/06/2014 at Unknown time  . ondansetron  (ZOFRAN ODT) 4 MG disintegrating tablet Take 1 tablet (4 mg total) by mouth every 8 (eight) hours as needed for nausea or vomiting. 20 tablet 0 09/06/2014 at Unknown time  . OVER THE COUNTER MEDICATION    09/06/2014 at Unknown time  . rivaroxaban (XARELTO) 20 MG TABS tablet Take 1 tablet (20 mg total) by mouth daily with breakfast. 30 tablet 0 09/06/2014 at 0630  . spironolactone (ALDACTONE) 25 MG tablet Take 1 tablet (25 mg total) by mouth daily. 90 tablet 3 09/06/2014 at Unknown time   Infusions:  . dextrose 5 % and 0.9% NaCl 50 mL/hr at 09/07/14 0040  . heparin      Assessment: Patient with possible cholecystitis per CT.  HIDA today to confirm.  Zosyn started empirically, and heparin as bridge for PTA rivaroxaban.   PTT ordered with Heparin level until both correlate due to possible drug-lab interaction between rivaroxaban and anti-Xa level (aka heparin level)   Goal of Therapy:  Heparin level 0.3-0.7 units/ml Monitor platelets by anticoagulation protocol: Yes  PTT 66-102 secs   Plan:  Heparin drip at 1400 units/hr Daily CBC Next heparin level/PTT at [place], [place] 09/07/2014,1:02 AM

## 2014-09-07 NOTE — Care Management Note (Signed)
Case Management Note  Patient Details  Name: JEDI CATALFAMO MRN: [id number] Date of Birth: January 29, 1945  Subjective/Objective:            acute cholcystitis        Action/Plan:Date:  Sept.2, 2016 U.R. performed for needs and level of care. Will continue to follow for Case Management needs.  Velva Harman, RN, BSN, [place]   [phone]   Expected Discharge Date:                  Expected Discharge Plan:  Home/Self Care  In-House Referral:  NA  Discharge planning Services  CM Consult  Post Acute Care Choice:  NA Choice offered to:  NA  DME Arranged:    DME Agency:     HH Arranged:    HH Agency:     Status of Service:  In process, will continue to follow  Medicare Important Message Given:    Date Medicare IM Given:    Medicare IM give by:    Date Additional Medicare IM Given:    Additional Medicare Important Message give by:     If discussed at [place] of Stay Meetings, dates discussed:    Additional Comments:  Leeroy Cha, RN 09/07/2014, 2:05 PM

## 2014-09-07 NOTE — Consult Note (Signed)
CARDIOLOGY CONSULT NOTE   Patient ID: Cody Suarez MRN: [id number],  DOB/AGE: 05/28/1945   Admit date: 09/06/2014 Date of Consult: 09/07/2014   Primary Physician: Martinique, BETTY G, MD Primary Cardiologist: Dr. Fletcher Anon  Pt. Profile  Cody Suarez is a pleasant 69 year old male with past medical history of hypertension, PVD, and the stage II colon CA s/p partial colectomy and chemotherapy presented with acute cholecystitis, cardiology consulted for preop clearance  Problem List  Past Medical History  Diagnosis Date  . Diarrhea   . Hypertension   . Ulcerative colitis     history  . Anemia of chronic disease 2015    "related to cancer tx" (08/09/2013)  . Arthritis     "thumb joints" (08/09/2013)  . Kidney stones X 2    "passed them both"  . Peripheral vascular disease   . Colon cancer 03/2013    Past Surgical History  Procedure Laterality Date  . Vasectomy    . Laparoscopic right hemi colectomy N/A 03/28/2013    Procedure: LAPAROSCOPIC ASSISTED HEMI COLECTOMY;  Surgeon: Pedro Earls, MD;  Location: [facility] ORS;  Service: General;  Laterality: N/A;  . Colon surgery    . Lower extremity angiogram Bilateral 08/09/2013  . Tonsillectomy  ~ 1950  . Hernia repair  ~ 1995; 03/2013    UHR  . Umbilical hernia repair  1995; 03/2013  . Tee without cardioversion N/A 08/10/2013    Procedure: TRANSESOPHAGEAL ECHOCARDIOGRAM (TEE);  Surgeon: Candee Furbish, MD;  Location: [facility] ENDOSCOPY;  Service: Cardiovascular;  Laterality: N/A;  . Femoral-popliteal bypass graft Right 08/11/2013    Procedure:   RIGHT - POPLITEAL TO PERONEAL ARTERY BYPASS GRAFT  WITH NONREVERSED SAPHENOUS VEIN GRAFT,tHROMBECTOMY ANTERIOR TIBIALIS,ATTEMPTED THROMBECTOMY TIBIO-PERONEAL TRUNK AND POSTERIOR TIBIALIS, INTRAOPERATIVE ARTERIOGRAM.;  Surgeon: Mal Misty, MD;  Location: [facility] OR;  Service: Vascular;  Laterality: Right;  . Femoral-tibial bypass graft Right 11/17/2013    Procedure: BYPASS GRAFT RIGHT ABOVE KNEE POPLITEAL TO POSTERIOR  TIBIAL ARTERY USING RIGHT NON-REVERSED CEPHALIC VEIN;  Surgeon: Mal Misty, MD;  Location: [place];  Service: Vascular;  Laterality: Right;  . Vein harvest Right 11/17/2013    Procedure: HARVEST OF RIGHT UPPER EXTREMITY CEPHALIC VEIN;  Surgeon: Mal Misty, MD;  Location: [place];  Service: Vascular;  Laterality: Right;  . Intraoperative arteriogram Right 11/17/2013    Procedure: INTRA OPERATIVE ARTERIOGRAM;  Surgeon: Mal Misty, MD;  Location: [place];  Service: Vascular;  Laterality: Right;  . Abdominal aortagram N/A 08/09/2013    Procedure: ABDOMINAL Maxcine Ham;  Surgeon: [name], MD;  Location: [facility] CATH LAB;  Service: Cardiovascular;  Laterality: N/A;     Allergies  Allergies  Allergen Reactions  . Amlodipine Swelling    Right LE edema    HPI   Cody Suarez is a pleasant 69 year old male with past medical history of hypertension, PVD, and the stage II colon CA s/p partial colectomy and chemotherapy. He denies any prior history of MI or exertional chest discomfort. He never had a stress test or cardiac catheterization. He states all of his trouble started in the March of 2015 when he first diagnosed with colon cancer. He underwent partial colectomy and had a 6 month chemotherapy. His last CT of abdomen in July of this year showed no recurrence of colon cancer. He was seen in July 2015 for 2-3 month of bilateral calf discomfort. He underwent a lower extremity venous duplex which showed no evidence of DVT, ABI was moderately to severely reduced  bilaterally. 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 The lower extremity blockage was felt to be embolic in nature. 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 CTA was obtained which showed ulcerated plaque in the proximal descending aorta with thrombus. He was evaluated by vascular surgery, anticoagulation was recommended and he has been on Xarelto since August of last year. The blockages worse on the right, he has underwent right SFA to posterior tibial bypass by Dr. Kellie Simmering multiple times with poor outcome due to poor runoff. 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   According to the patient, he denies any pain in the leg at rest. He states if he walk slowly, he can ambulate all day without calf pain. However fast walking will cause predominantly right-sided lower extremity pain. He denies any exertional chest discomfort, however also noting that he avoid strenuous activity.  He has chronic right pedal edema since lower extremity emboli occurred last year. Otherwise denies any recent increasing lower extremity edema, orthopnea or paroxysmal nocturnal dyspnea. He denies recent fever, chill or cough.  He was in his usual state of health and was actually at work around 8:30 a.m. on 09/06/2014 when he started having abdominal discomfort. He initially tried to work through it, however by 9:30am, the pain was so intense he had to go home. He had nausea and vomiting x3 afterward. By 3 PM he asked his wife to bring him to seek medical attention. On arrival to [facility], significant laboratory finding include creatinine of 1.17, negative troponin, lactic acid 1.86, white blood cell count 14.1. CT of the abdomen was concerning for acute cholecystitis, also noting 5 mm nonobstructive right renal stone. Abdominal ultrasound obtained on the same day support the diagnosis of acute cholecystitis. Surgery was consulted, HIDA was done on 9/2  which was positive for acute cholecystitis. Cardiology has been consulted for preoperative clearance for cholecystectomy. Of note, echocardiogram is ordered, and is currently pending.    Inpatient Medications  . budesonide  9 mg Oral Daily  . carvedilol  6.25 mg Oral BID  . docusate sodium  100 mg Oral BID  . folic acid  1 mg Oral Daily  . insulin aspart  0-15 Units Subcutaneous 6 times per day  . losartan  100 mg Oral Daily  . multivitamin with minerals  1 tablet Oral Daily  . piperacillin-tazobactam (ZOSYN)  IV  3.375 g Intravenous 3 times per day  . spironolactone  25 mg Oral Daily  . thiamine  100 mg Oral Daily    Family History Family History  Problem Relation Age of Onset  . Heart disease Mother   . Emphysema Father      Social History Social History   Social History  . Marital Status: Married    Spouse Name: N/A  . Number of Children: N/A  . Years of Education: N/A   Occupational History  . Not on file.   Social History Main Topics  . Smoking status: Never Smoker   . Smokeless tobacco: Never Used  . Alcohol Use: 1.8  oz/week    3 Glasses of wine per week  . Drug Use: No  . Sexual Activity: Yes   Other Topics Concern  . Not on file   Social History Narrative     Review of Systems  General:  No chills, fever, night sweats or weight changes.  Cardiovascular:  No chest pain, dyspnea on exertion, edema, orthopnea, palpitations, paroxysmal nocturnal dyspnea. Dermatological: No rash, lesions/masses Respiratory: No cough, dyspnea Urologic: No hematuria, dysuria Abdominal:   No diarrhea, bright red blood per rectum, melena, or hematemesis +nausea, vomiting, abdominal resolved since yesterday Neurologic:  No visual changes, wkns, changes in mental status. All other systems reviewed and are otherwise negative except as noted above.  Physical Exam  Blood pressure 128/67, pulse 77, temperature 98.3 F (36.8 C), temperature source Oral, resp. rate 18, height 5'  10" (1.778 m), weight 193 lb (87.544 kg), SpO2 94 %.  General: Pleasant, NAD Psych: Normal affect. Neuro: Alert and oriented X 3. Moves all extremities spontaneously. HEENT: Normal  Neck: Supple without bruits or JVD. Lungs:  Resp regular and unlabored. Intermittent basilar rale, largely CTA Heart: RRR no s3, s4, or murmurs. Abdomen:  +abdomen distended. Nontender Extremities: No clubbing, cyanosis. 2+ pedal edema on R, no pedal edema on left. No pretibial edema. L foot cold, but has weak pulse. Did not appreciate good pulse on R despite being warm. 2+ radial pulse  Labs   Recent Labs  09/06/14 1700  TROPONINI <0.03   Lab Results  Component Value Date   WBC 13.4* 09/07/2014   HGB 14.4 09/07/2014   HCT 43.0 09/07/2014   MCV 94.7 09/07/2014   PLT 303 09/07/2014     Recent Labs Lab 09/07/14 0530  NA 136  K 3.9  CL 100*  CO2 26  BUN 13  CREATININE 0.95  CALCIUM 9.2  PROT 6.7  BILITOT 1.1  ALKPHOS 34*  ALT 23  AST 23  GLUCOSE 127*   Lab Results  Component Value Date   CHOL 141 08/10/2013   HDL 59 08/10/2013   LDLCALC 65 08/10/2013   TRIG 85 08/10/2013   No results found for: DDIMER  Radiology/Studies  Nm Hepatobiliary Including Gb  09/07/2014   CLINICAL DATA:  Evaluate for acute cholecystitis.  EXAM: NUCLEAR MEDICINE HEPATOBILIARY IMAGING  TECHNIQUE: Sequential images of the abdomen were obtained out to 60 minutes following intravenous administration of radiopharmaceutical.  RADIOPHARMACEUTICALS:  5.04 mCi Tc-22m Choletec IV  COMPARISON:  Ultrasound 09/06/2014; CT 09/06/2014  FINDINGS: Normal uptake of radiotracer within the liver. Radiotracer is excreted into biliary system and small bowel. There is non opacification of the gallbladder consistent with cystic duct obstruction.  IMPRESSION: Not opacification of the gallbladder compatible with cystic duct obstruction. Findings are concerning for acute cholecystitis.  These results will be called to the ordering  clinician or representative by the Radiologist Assistant, and communication documented in the PACS or zVision Dashboard.   Electronically Signed   By: DLovey NewcomerM.D.   On: 09/07/2014 14:49   Ct Abdomen Pelvis W Contrast  09/06/2014   CLINICAL DATA:  Acute onset of mid upper abdominal pain, nausea and vomiting. Initial encounter.  EXAM: CT ABDOMEN AND PELVIS WITH CONTRAST  TECHNIQUE: Multidetector CT imaging of the abdomen and pelvis was performed using the standard protocol following bolus administration of intravenous contrast.  CONTRAST:  1015mOMNIPAQUE IOHEXOL 300 MG/ML  SOLN  COMPARISON:  CT of the chest, abdomen and pelvis from 07/18/2014  FINDINGS: Mild bibasilar atelectasis  is noted.  The liver and spleen are unremarkable in appearance. Mild wall thickening and stranding is noted about the gallbladder, raising question for mild acute cholecystitis. The pancreas and adrenal glands are unremarkable.  Nonobstructing right renal stones are seen, measuring up to 5 mm in size. Mild nonspecific perinephric stranding is noted bilaterally. The kidneys are otherwise unremarkable. There is no evidence of hydronephrosis. No obstructing ureteral stones are seen.  No free fluid is identified. The small bowel is unremarkable in appearance. The stomach is within normal limits. No acute vascular abnormalities are seen. Scattered calcification is noted along the abdominal aorta and its branches.  The appendix is normal in caliber, without evidence of appendicitis. Postoperative change is noted at the descending colon. The colon is largely decompressed.  The bladder is mildly distended and grossly unremarkable. The prostate remains normal in size. No inguinal lymphadenopathy is seen.  No acute osseous abnormalities are identified. Vacuum phenomenon is noted at L5-S1.  IMPRESSION: 1. Mild wall thickening and stranding about the gallbladder, raising question for mild acute cholecystitis. 2. Nonobstructing right renal stones  measure up to 5 mm in size. 3. Mild bibasilar atelectasis noted. 4. Scattered calcification along the abdominal aorta and its branches.   Electronically Signed   By: Garald Balding M.D.   On: 09/06/2014 19:53   US Abdomen Limited  09/06/2014   CLINICAL DATA:  Epigastric abdominal pain for 12 hours, abnormal gallbladder at prior imaging  EXAM: US ABDOMEN LIMITED - RIGHT UPPER QUADRANT  COMPARISON:  CT same date  FINDINGS: Gallbladder:  Gallbladder wall thickness 3 mm. No pericholecystic fluid. Dependent sludge. Gallbladder is distended.  Common bile duct:  Diameter: 2 mm  Liver:  Hepatic increased echogenicity likely indicates underlying steatosis without focal abnormality or intrahepatic ductal dilatation.  IMPRESSION: Gallbladder distention, wall thickness at upper limits of normal, and dependent sludge, which may be seen with acute cholecystitis.  Hepatic increased echogenicity likely indicates underlying steatosis without focal abnormality or intrahepatic ductal dilatation.   Electronically Signed   By: Conchita Paris M.D.   On: 09/06/2014 21:59   Dg Abd Acute W/chest  09/06/2014   CLINICAL DATA:  Acute onset of epigastric abdominal pain, nausea and vomiting earlier today. Personal history of colon cancer and abdominal wall hernia.  EXAM: DG ABDOMEN ACUTE W/ 1V CHEST  COMPARISON:  CT abdomen and pelvis 07/18/2014. Two-view chest x-ray 09/26/2013.  FINDINGS: Bowel gas pattern unremarkable without evidence of obstruction or significant ileus. No evidence of free air or significant air-fluid levels on the erect image. Expected stool burden in the colon. Surgical anastomotic suture material in the left upper quadrant. Phleboliths low in the left side of the pelvis. No visible opaque urinary tract calculi. Slight thoracolumbar scoliosis convex right and mild degenerative changes involving the thoracic and lumbar spine.  Cardiac silhouette normal in size, unchanged. Mild atherosclerosis involving the thoracic  aorta, unchanged. Hilar and mediastinal contours otherwise unremarkable. Stable chronic elevation of the right hemidiaphragm. Linear atelectasis at the left lung base. Lungs otherwise clear. No pleural effusions.  IMPRESSION: 1. No acute abdominal abnormality. 2. Mild linear atelectasis at the left lung base. No acute cardiopulmonary disease otherwise.   Electronically Signed   By: Evangeline Dakin M.D.   On: 09/06/2014 17:55    ECG  normal sinus rhythm without significant ST-T wave changes, poor R wave progression anterior lead.  ASSESSMENT AND PLAN  1. Preoperative clearance for cholecystomy.   - he has no prior h/o MI, previous CT  did note vascular calcification in aorta, but did not mention coronary calcification. Patient denies ever having exertional symptom. Denies ever having CP  - discussed with MD, no obvious need for myoview or invasive workup, echo pending, unless echo shows decreased EF or wall motion abnormality, no further workup needed  2. Acute cholecystitis: positive CT of abdomen, positive abdominal U/S, positive HIDA scan  - sx improved on abx, no further abdominal pain today  3. 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 HTN  5. H/o colon CA s/p partial colectomy and chemo therapy   Signed, Almyra Deforest, PA-C 09/07/2014, 4:20 PM    Patient seen and examined. Agree with assessment and plan.  Cody Suarez is a 69 year old gentleman who has a history of hypertension, peripheral vascular disease, and who is status post partial colectomy and chemotherapy for: CVA.  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  He continues to have claudication with documented one vessel runoff below the knee bilaterally via the peroneal artery is occluded proximally and reconstitutes distally and had undergone vascular surgery by Dr. Kellie Simmering.  The patient was recently found to have acute cholecystitis and is in need  for surgery.  His last dose of Xarelto was yesterday morning.  He denies any history of chest pain.  He denies any change in exercise tolerance, but his ambulation has been limited by claudication which develops with fast walking.  He denies any symptoms of congestive heart failure or any awareness of arrhythmia.  A chest CT has noticed calcification in the aorta.  His ECG is without acute change and shows sinus rhythm.  I will repeat this since it may be possible that there may have been a V2/V3 lead switch.  An echo Doppler study has been ordered.  As long as this does not show significant wall motion abnormality or reduced LV function, he will be given clearance for planned operative intervention for his acute cholecystitis.   Troy Sine, MD, [facility] 09/07/2014 5:31 PM

## 2014-09-07 NOTE — Progress Notes (Addendum)
[facility] PROGRESS NOTE  Cody Suarez OIT:[id number] DOB: 1945/07/05 DOA: 09/06/2014 PCP: [place], BETTY G, MD  Assessment/Plan: Principal Problem:   Cholecystitis Active Problems:   Hypertension   Anemia of chronic disease   Hyperglycemia   Acute cholecystitis   Abdominal pain    Suspected acute cholecystitis-no surgery following,HIDA scan today. Further recommendations based on the results. Continue Zosyn  History of arterial thromboembolism, followed by Dr. Fletcher Anon and Dr. Kellie Simmering, . Patient denies any significant cardiac history, he has a history of arterial thromboembolism and peripheral vascular disease but no significant coronary artery disease. Will repeat 2-D echo, holding Xarelto. Continue heparin drip pending decision about surgery. I would not delay surgery even if the echo results are not back and patient is deemed low risk from a cardiac perspective    Hypertension-continue Coreg, Cozaar and Aldactone  Diabetes continue sliding scale insulin   Code Status:      Code Status Orders        Start     Ordered   09/07/14 0005  Full code   Continuous     09/07/14 0004     Family Communication: family updated about patient's clinical progress Disposition Plan:  As above    Brief narrative: Cody Suarez is a 69 y.o. 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 Patient states that pain started this morning while at work. He stayed at work and finally decided to go to the hospital around 3 PM. Patient states that that severity of the pain was a 10/10 located in the epigastric area and was very sharp. It did not radiate. He states he had nausea and also vomited 3 times. He had no chest pain noted. He had no fever noted. He had no diarrhea. He notes no sweating. He states that he took some hydrocodone which did not touch. In the ED he received  morphine time 2 which did help the pain. He has not had pain like this prior. He states he has PVD and has two grafts done in the right leg and both grafts he states failed. He states he has been on xaralto and has been told to stay on the [place]. Patient states he was told he is high risk for losing his limb. He also admits to history of colon cancer resection and chemotherapy was given. He was last seen in July for follow up and has been told he is cancer free.  Consultants: Jackolyn Confer, MD  Procedures:  None   Antibiotics: Anti-infectives    Start     Dose/Rate Route Frequency Ordered Stop   09/07/14 0600  piperacillin-tazobactam (ZOSYN) IVPB 3.375 g     3.375 g 12.5 mL/hr over 240 Minutes Intravenous 3 times per day 09/07/14 0052     09/06/14 2015  piperacillin-tazobactam (ZOSYN) IVPB 3.375 g     3.375 g 12.5 mL/hr over 240 Minutes Intravenous  Once 09/06/14 2014 09/06/14 2055         HPI/Subjective: No pain since last night. VSS. LFTs are normal. WBC down. No n/v.   Objective: Filed Vitals:   09/06/14 2135 09/06/14 2237 09/07/14 0413 09/07/14 0840  BP: 143/79 156/79 144/71 128/73  Pulse: 87 95 83 88  Temp: 98.1 F (36.7 C) 98.2 F (36.8 C) 99 F (37.2 C) 98.4 F (36.9 C)  TempSrc: Oral Oral Oral Oral  Resp:  16 18 18 16   Height:      Weight:      SpO2: 96% 97% 96% 97%    Intake/Output Summary (Last 24 hours) at 09/07/14 1140 Last data filed at 09/07/14 0900  Gross per 24 hour  Intake    360 ml  Output    725 ml  Net   -365 ml    Exam:  General: No acute respiratory distress Lungs: Clear to auscultation bilaterally without wheezes or crackles Cardiovascular: Regular rate and rhythm without murmur gallop or rub normal S1 and S2 Abdomen: Nontender, nondistended, soft, bowel sounds positive, no rebound, no ascites, no appreciable mass Extremities: No significant cyanosis, clubbing, or edema bilateral lower extremities     Data Review   Micro  Results No results found for this or any previous visit (from the past 240 hour(s)).  Radiology Reports Ct Abdomen Pelvis W Contrast  09/06/2014   CLINICAL DATA:  Acute onset of mid upper abdominal pain, nausea and vomiting. Initial encounter.  EXAM: CT ABDOMEN AND PELVIS WITH CONTRAST  TECHNIQUE: Multidetector CT imaging of the abdomen and pelvis was performed using the standard protocol following bolus administration of intravenous contrast.  CONTRAST:  154m OMNIPAQUE IOHEXOL 300 MG/ML  SOLN  COMPARISON:  CT of the chest, abdomen and pelvis from 07/18/2014  FINDINGS: Mild bibasilar atelectasis is noted.  The liver and spleen are unremarkable in appearance. Mild wall thickening and stranding is noted about the gallbladder, raising question for mild acute cholecystitis. The pancreas and adrenal glands are unremarkable.  Nonobstructing right renal stones are seen, measuring up to 5 mm in size. Mild nonspecific perinephric stranding is noted bilaterally. The kidneys are otherwise unremarkable. There is no evidence of hydronephrosis. No obstructing ureteral stones are seen.  No free fluid is identified. The small bowel is unremarkable in appearance. The stomach is within normal limits. No acute vascular abnormalities are seen. Scattered calcification is noted along the abdominal aorta and its branches.  The appendix is normal in caliber, without evidence of appendicitis. Postoperative change is noted at the descending colon. The colon is largely decompressed.  The bladder is mildly distended and grossly unremarkable. The prostate remains normal in size. No inguinal lymphadenopathy is seen.  No acute osseous abnormalities are identified. Vacuum phenomenon is noted at L5-S1.  IMPRESSION: 1. Mild wall thickening and stranding about the gallbladder, raising question for mild acute cholecystitis. 2. Nonobstructing right renal stones measure up to 5 mm in size. 3. Mild bibasilar atelectasis noted. 4. Scattered  calcification along the abdominal aorta and its branches.   Electronically Signed   By: JGarald BaldingM.D.   On: 09/06/2014 19:53   [place] Limited  09/06/2014   CLINICAL DATA:  Epigastric abdominal pain for 12 hours, abnormal gallbladder at prior imaging  EXAM: [place] LIMITED - RIGHT UPPER QUADRANT  COMPARISON:  CT same date  FINDINGS: Gallbladder:  Gallbladder wall thickness 3 mm. No pericholecystic fluid. Dependent sludge. Gallbladder is distended.  Common bile duct:  Diameter: 2 mm  Liver:  Hepatic increased echogenicity likely indicates underlying steatosis without focal abnormality or intrahepatic ductal dilatation.  IMPRESSION: Gallbladder distention, wall thickness at upper limits of normal, and dependent sludge, which may be seen with acute cholecystitis.  Hepatic increased echogenicity likely indicates underlying steatosis without focal abnormality or intrahepatic ductal dilatation.   Electronically Signed   By: GConchita ParisM.D.   On: 09/06/2014 21:59   Dg Abd Acute W/chest  09/06/2014   CLINICAL DATA:  Acute onset of epigastric abdominal pain, nausea and vomiting earlier today. Personal history of colon cancer and abdominal wall hernia.  EXAM: DG ABDOMEN ACUTE W/ 1V CHEST  COMPARISON:  CT abdomen and pelvis 07/18/2014. Two-view chest x-ray 09/26/2013.  FINDINGS: Bowel gas pattern unremarkable without evidence of obstruction or significant ileus. No evidence of free air or significant air-fluid levels on the erect image. Expected stool burden in the colon. Surgical anastomotic suture material in the left upper quadrant. Phleboliths low in the left side of the pelvis. No visible opaque urinary tract calculi. Slight thoracolumbar scoliosis convex right and mild degenerative changes involving the thoracic and lumbar spine.  Cardiac silhouette normal in size, unchanged. Mild atherosclerosis involving the thoracic aorta, unchanged. Hilar and mediastinal contours otherwise unremarkable. Stable  chronic elevation of the right hemidiaphragm. Linear atelectasis at the left lung base. Lungs otherwise clear. No pleural effusions.  IMPRESSION: 1. No acute abdominal abnormality. 2. Mild linear atelectasis at the left lung base. No acute cardiopulmonary disease otherwise.   Electronically Signed   By: Evangeline Dakin M.D.   On: 09/06/2014 17:55     CBC  Recent Labs Lab 09/06/14 1700 09/07/14 0530  WBC 14.1* 13.4*  HGB 14.2 14.4  HCT 41.8 43.0  PLT 304 303  MCV 92.9 94.7  MCH 31.6 31.7  MCHC 34.0 33.5  RDW 14.1 14.5  LYMPHSABS 1.7  --   MONOABS 1.4*  --   EOSABS 0.1  --   BASOSABS 0.1  --     Chemistries   Recent Labs Lab 09/06/14 1700 09/07/14 0530  NA 135 136  K 3.9 3.9  CL 99* 100*  CO2 25 26  GLUCOSE 134* 127*  BUN 19 13  CREATININE 1.17 0.95  CALCIUM 9.7 9.2  AST 23 23  ALT 24 23  ALKPHOS 35* 34*  BILITOT 0.8 1.1   ------------------------------------------------------------------------------------------------------------------ estimated creatinine clearance is 75.8 mL/min (by C-G formula based on Cr of 0.95). ------------------------------------------------------------------------------------------------------------------ No results for input(s): HGBA1C in the last 72 hours. ------------------------------------------------------------------------------------------------------------------ No results for input(s): CHOL, HDL, LDLCALC, TRIG, CHOLHDL, LDLDIRECT in the last 72 hours. ------------------------------------------------------------------------------------------------------------------  Recent Labs  09/07/14 0530  TSH 2.305   ------------------------------------------------------------------------------------------------------------------  Recent Labs  09/07/14 0530  VITAMINB12 330  FERRITIN 165  TIBC 339  IRON 20*    Coagulation profile  Recent Labs Lab 09/07/14 0530  INR 1.27    No results for input(s): DDIMER in the last 72  hours.  Cardiac Enzymes  Recent Labs Lab 09/06/14 1700  TROPONINI <0.03   ------------------------------------------------------------------------------------------------------------------ Invalid input(s): POCBNP   CBG:  Recent Labs Lab 09/07/14 0022 09/07/14 0416 09/07/14 0736  GLUCAP 129* 128* 149*       Studies: Ct Abdomen Pelvis W Contrast  09/06/2014   CLINICAL DATA:  Acute onset of mid upper abdominal pain, nausea and vomiting. Initial encounter.  EXAM: CT ABDOMEN AND PELVIS WITH CONTRAST  TECHNIQUE: Multidetector CT imaging of the abdomen and pelvis was performed using the standard protocol following bolus administration of intravenous contrast.  CONTRAST:  184m OMNIPAQUE IOHEXOL 300 MG/ML  SOLN  COMPARISON:  CT of the chest, abdomen and pelvis from 07/18/2014  FINDINGS: Mild bibasilar atelectasis is noted.  The liver and spleen are unremarkable in appearance. Mild wall thickening and stranding is noted about the gallbladder, raising question for mild acute cholecystitis. The pancreas and adrenal glands are unremarkable.  Nonobstructing right renal stones are seen, measuring up to 5 mm in size. Mild nonspecific perinephric stranding is noted  bilaterally. The kidneys are otherwise unremarkable. There is no evidence of hydronephrosis. No obstructing ureteral stones are seen.  No free fluid is identified. The small bowel is unremarkable in appearance. The stomach is within normal limits. No acute vascular abnormalities are seen. Scattered calcification is noted along the abdominal aorta and its branches.  The appendix is normal in caliber, without evidence of appendicitis. Postoperative change is noted at the descending colon. The colon is largely decompressed.  The bladder is mildly distended and grossly unremarkable. The prostate remains normal in size. No inguinal lymphadenopathy is seen.  No acute osseous abnormalities are identified. Vacuum phenomenon is noted at L5-S1.   IMPRESSION: 1. Mild wall thickening and stranding about the gallbladder, raising question for mild acute cholecystitis. 2. Nonobstructing right renal stones measure up to 5 mm in size. 3. Mild bibasilar atelectasis noted. 4. Scattered calcification along the abdominal aorta and its branches.   Electronically Signed   By: Garald Balding M.D.   On: 09/06/2014 19:53   US Abdomen Limited  09/06/2014   CLINICAL DATA:  Epigastric abdominal pain for 12 hours, abnormal gallbladder at prior imaging  EXAM: US ABDOMEN LIMITED - RIGHT UPPER QUADRANT  COMPARISON:  CT same date  FINDINGS: Gallbladder:  Gallbladder wall thickness 3 mm. No pericholecystic fluid. Dependent sludge. Gallbladder is distended.  Common bile duct:  Diameter: 2 mm  Liver:  Hepatic increased echogenicity likely indicates underlying steatosis without focal abnormality or intrahepatic ductal dilatation.  IMPRESSION: Gallbladder distention, wall thickness at upper limits of normal, and dependent sludge, which may be seen with acute cholecystitis.  Hepatic increased echogenicity likely indicates underlying steatosis without focal abnormality or intrahepatic ductal dilatation.   Electronically Signed   By: Conchita Paris M.D.   On: 09/06/2014 21:59   Dg Abd Acute W/chest  09/06/2014   CLINICAL DATA:  Acute onset of epigastric abdominal pain, nausea and vomiting earlier today. Personal history of colon cancer and abdominal wall hernia.  EXAM: DG ABDOMEN ACUTE W/ 1V CHEST  COMPARISON:  CT abdomen and pelvis 07/18/2014. Two-view chest x-ray 09/26/2013.  FINDINGS: Bowel gas pattern unremarkable without evidence of obstruction or significant ileus. No evidence of free air or significant air-fluid levels on the erect image. Expected stool burden in the colon. Surgical anastomotic suture material in the left upper quadrant. Phleboliths low in the left side of the pelvis. No visible opaque urinary tract calculi. Slight thoracolumbar scoliosis convex right and mild  degenerative changes involving the thoracic and lumbar spine.  Cardiac silhouette normal in size, unchanged. Mild atherosclerosis involving the thoracic aorta, unchanged. Hilar and mediastinal contours otherwise unremarkable. Stable chronic elevation of the right hemidiaphragm. Linear atelectasis at the left lung base. Lungs otherwise clear. No pleural effusions.  IMPRESSION: 1. No acute abdominal abnormality. 2. Mild linear atelectasis at the left lung base. No acute cardiopulmonary disease otherwise.   Electronically Signed   By: Evangeline Dakin M.D.   On: 09/06/2014 17:55      No results found for: HGBA1C Lab Results  Component Value Date   LDLCALC 65 08/10/2013   CREATININE 0.95 09/07/2014       Scheduled Meds: . budesonide  9 mg Oral Daily  . carvedilol  6.25 mg Oral BID  . docusate sodium  100 mg Oral BID  . folic acid  1 mg Oral Daily  . insulin aspart  0-15 Units Subcutaneous 6 times per day  . losartan  100 mg Oral Daily  . multivitamin with minerals  1  tablet Oral Daily  . piperacillin-tazobactam (ZOSYN)  IV  3.375 g Intravenous 3 times per day  . spironolactone  25 mg Oral Daily  . thiamine  100 mg Oral Daily   Continuous Infusions: . dextrose 5 % and 0.9% NaCl 50 mL/hr at 09/07/14 0040  . heparin 1,400 Units/hr (09/07/14 0370)    Principal Problem:   Cholecystitis Active Problems:   Hypertension   Anemia of chronic disease   Hyperglycemia   Acute cholecystitis   Abdominal pain    Time spent: 45 minutes   [facility] Pager [phone]. If 7PM-7AM, please contact night-coverage at [email or web address], password [email or web address] 09/07/2014, 11:40 AM  LOS: 1 day

## 2014-09-07 NOTE — Progress Notes (Signed)
Patient ID: Cody Suarez, male   DOB: 1945/11/03, 69 y.o.   MRN: [id number]     [facility]      [address] [place], [place] [address]    Phone: [phone] FAX: [phone]     Subjective: No pain since last night.  VSS.  LFTs are normal.  WBC down.  No n/v.   Objective:  Vital signs:  Filed Vitals:   09/06/14 2135 09/06/14 2237 09/07/14 0413 09/07/14 0840  BP: 143/79 156/79 144/71 128/73  Pulse: 87 95 83 88  Temp: 98.1 F (36.7 C) 98.2 F (36.8 C) 99 F (37.2 C) 98.4 F (36.9 C)  TempSrc: Oral Oral Oral Oral  Resp: 16 18 18 16   Height:      Weight:      SpO2: 96% 97% 96% 97%    Last BM Date: 09/06/14  Intake/Output   Yesterday:  09/01 0701 - 09/02 0700 In: 240 [P.O.:240] Out: 525 [Urine:525] This shift:    I/O last 3 completed shifts: In: 240 [P.O.:240] Out: 88 [Urine:525]    Physical Exam: General: Pt awake/alert/oriented x4 in no acute distress  Abdomen: Soft.  Nondistended.  Nontender.  Small reducible ventral hernia.  No evidence of peritonitis.  No incarcerated hernias.    Problem List:   Principal Problem:   Cholecystitis Active Problems:   Hypertension   Anemia of chronic disease   Hyperglycemia   Acute cholecystitis   Abdominal pain    Results:   Labs: Results for orders placed or performed during the hospital encounter of 09/06/14 (from the past 48 hour(s))  CBC with Differential/Platelet     Status: Abnormal   Collection Time: 09/06/14  5:00 PM  Result Value Ref Range   WBC 14.1 (H) 4.0 - 10.5 K/uL   RBC 4.50 4.22 - 5.81 MIL/uL   Hemoglobin 14.2 13.0 - 17.0 g/dL   HCT 41.8 39.0 - 52.0 %   MCV 92.9 78.0 - 100.0 fL   MCH 31.6 26.0 - 34.0 pg   MCHC 34.0 30.0 - 36.0 g/dL   RDW 14.1 11.5 - 15.5 %   Platelets 304 150 - 400 K/uL   Neutrophils Relative % 76 43 - 77 %   Neutro Abs 10.8 (H) 1.7 - 7.7 K/uL   Lymphocytes Relative 12 12 - 46 %   Lymphs Abs 1.7 0.7 - 4.0 K/uL    Monocytes Relative 10 3 - 12 %   Monocytes Absolute 1.4 (H) 0.1 - 1.0 K/uL   Eosinophils Relative 1 0 - 5 %   Eosinophils Absolute 0.1 0.0 - 0.7 K/uL   Basophils Relative 1 0 - 1 %   Basophils Absolute 0.1 0.0 - 0.1 K/uL  Comprehensive metabolic panel     Status: Abnormal   Collection Time: 09/06/14  5:00 PM  Result Value Ref Range   Sodium 135 135 - 145 mmol/L   Potassium 3.9 3.5 - 5.1 mmol/L   Chloride 99 (L) 101 - 111 mmol/L   CO2 25 22 - 32 mmol/L   Glucose, Bld 134 (H) 65 - 99 mg/dL   BUN 19 6 - 20 mg/dL   Creatinine, Ser 1.17 0.61 - 1.24 mg/dL   Calcium 9.7 8.9 - 10.3 mg/dL   Total Protein 7.1 6.5 - 8.1 g/dL   Albumin 3.8 3.5 - 5.0 g/dL   AST 23 15 - 41 U/L   ALT 24 17 - 63 U/L   Alkaline Phosphatase 35 (L)  38 - 126 U/L   Total Bilirubin 0.8 0.3 - 1.2 mg/dL   GFR calc non Af Amer >60 >60 mL/min   GFR calc Af Amer >60 >60 mL/min    Comment: (NOTE) The eGFR has been calculated using the CKD EPI equation. This calculation has not been validated in all clinical situations. eGFR's persistently <60 mL/min signify possible Chronic Kidney Disease.    Anion gap 11 5 - 15  Lipase, blood     Status: Abnormal   Collection Time: 09/06/14  5:00 PM  Result Value Ref Range   Lipase 15 (L) 22 - 51 U/L  Troponin I     Status: None   Collection Time: 09/06/14  5:00 PM  Result Value Ref Range   Troponin I <0.03 <0.031 ng/mL    Comment:        NO INDICATION OF MYOCARDIAL INJURY.   Urinalysis, Routine w reflex microscopic (not at [facility])     Status: None   Collection Time: 09/06/14  5:10 PM  Result Value Ref Range   Color, Urine YELLOW YELLOW   APPearance CLEAR CLEAR   Specific Gravity, Urine 1.018 1.005 - 1.030   pH 5.5 5.0 - 8.0   Glucose, UA NEGATIVE NEGATIVE mg/dL   Hgb urine dipstick NEGATIVE NEGATIVE   Bilirubin Urine NEGATIVE NEGATIVE   Ketones, ur NEGATIVE NEGATIVE mg/dL   Protein, ur NEGATIVE NEGATIVE mg/dL   Urobilinogen, UA 0.2 0.0 - 1.0 mg/dL   Nitrite NEGATIVE  NEGATIVE   Leukocytes, UA NEGATIVE NEGATIVE    Comment: MICROSCOPIC NOT DONE ON URINES WITH NEGATIVE PROTEIN, BLOOD, LEUKOCYTES, NITRITE, OR GLUCOSE <1000 mg/dL.  I-Stat CG4 Lactic Acid, ED     Status: None   Collection Time: 09/06/14  5:14 PM  Result Value Ref Range   Lactic Acid, Venous 1.86 0.5 - 2.0 mmol/L  I-Stat CG4 Lactic Acid, ED     Status: None   Collection Time: 09/06/14  8:18 PM  Result Value Ref Range   Lactic Acid, Venous 1.88 0.5 - 2.0 mmol/L  Glucose, capillary     Status: Abnormal   Collection Time: 09/07/14 12:22 AM  Result Value Ref Range   Glucose-Capillary 129 (H) 65 - 99 mg/dL  Glucose, capillary     Status: Abnormal   Collection Time: 09/07/14  4:16 AM  Result Value Ref Range   Glucose-Capillary 128 (H) 65 - 99 mg/dL  TSH     Status: None   Collection Time: 09/07/14  5:30 AM  Result Value Ref Range   TSH 2.305 0.350 - 4.500 uIU/mL  Protime-INR     Status: Abnormal   Collection Time: 09/07/14  5:30 AM  Result Value Ref Range   Prothrombin Time 16.0 (H) 11.6 - 15.2 seconds   INR 1.27 0.00 - 1.49  APTT     Status: None   Collection Time: 09/07/14  5:30 AM  Result Value Ref Range   aPTT 29 24 - 37 seconds  Comprehensive metabolic panel     Status: Abnormal   Collection Time: 09/07/14  5:30 AM  Result Value Ref Range   Sodium 136 135 - 145 mmol/L   Potassium 3.9 3.5 - 5.1 mmol/L   Chloride 100 (L) 101 - 111 mmol/L   CO2 26 22 - 32 mmol/L   Glucose, Bld 127 (H) 65 - 99 mg/dL   BUN 13 6 - 20 mg/dL   Creatinine, Ser 0.95 0.61 - 1.24 mg/dL   Calcium 9.2 8.9 - 10.3 mg/dL  Total Protein 6.7 6.5 - 8.1 g/dL   Albumin 3.7 3.5 - 5.0 g/dL   AST 23 15 - 41 U/L   ALT 23 17 - 63 U/L   Alkaline Phosphatase 34 (L) 38 - 126 U/L   Total Bilirubin 1.1 0.3 - 1.2 mg/dL   GFR calc non Af Amer >60 >60 mL/min   GFR calc Af Amer >60 >60 mL/min    Comment: (NOTE) The eGFR has been calculated using the CKD EPI equation. This calculation has not been validated in all  clinical situations. eGFR's persistently <60 mL/min signify possible Chronic Kidney Disease.    Anion gap 10 5 - 15  CBC     Status: Abnormal   Collection Time: 09/07/14  5:30 AM  Result Value Ref Range   WBC 13.4 (H) 4.0 - 10.5 K/uL   RBC 4.54 4.22 - 5.81 MIL/uL   Hemoglobin 14.4 13.0 - 17.0 g/dL   HCT 43.0 39.0 - 52.0 %   MCV 94.7 78.0 - 100.0 fL   MCH 31.7 26.0 - 34.0 pg   MCHC 33.5 30.0 - 36.0 g/dL   RDW 14.5 11.5 - 15.5 %   Platelets 303 150 - 400 K/uL  Glucose, capillary     Status: Abnormal   Collection Time: 09/07/14  7:36 AM  Result Value Ref Range   Glucose-Capillary 149 (H) 65 - 99 mg/dL    Imaging / Studies: Ct Abdomen Pelvis W Contrast  09/06/2014   CLINICAL DATA:  Acute onset of mid upper abdominal pain, nausea and vomiting. Initial encounter.  EXAM: CT ABDOMEN AND PELVIS WITH CONTRAST  TECHNIQUE: Multidetector CT imaging of the abdomen and pelvis was performed using the standard protocol following bolus administration of intravenous contrast.  CONTRAST:  153m OMNIPAQUE IOHEXOL 300 MG/ML  SOLN  COMPARISON:  CT of the chest, abdomen and pelvis from 07/18/2014  FINDINGS: Mild bibasilar atelectasis is noted.  The liver and spleen are unremarkable in appearance. Mild wall thickening and stranding is noted about the gallbladder, raising question for mild acute cholecystitis. The pancreas and adrenal glands are unremarkable.  Nonobstructing right renal stones are seen, measuring up to 5 mm in size. Mild nonspecific perinephric stranding is noted bilaterally. The kidneys are otherwise unremarkable. There is no evidence of hydronephrosis. No obstructing ureteral stones are seen.  No free fluid is identified. The small bowel is unremarkable in appearance. The stomach is within normal limits. No acute vascular abnormalities are seen. Scattered calcification is noted along the abdominal aorta and its branches.  The appendix is normal in caliber, without evidence of appendicitis.  Postoperative change is noted at the descending colon. The colon is largely decompressed.  The bladder is mildly distended and grossly unremarkable. The prostate remains normal in size. No inguinal lymphadenopathy is seen.  No acute osseous abnormalities are identified. Vacuum phenomenon is noted at L5-S1.  IMPRESSION: 1. Mild wall thickening and stranding about the gallbladder, raising question for mild acute cholecystitis. 2. Nonobstructing right renal stones measure up to 5 mm in size. 3. Mild bibasilar atelectasis noted. 4. Scattered calcification along the abdominal aorta and its branches.   Electronically Signed   By: JGarald BaldingM.D.   On: 09/06/2014 19:53   [place]dom[place] Limited  09/06/2014   CLINICAL DATA:  Epigastric abdominal pain for 12 hours, abnormal gallbladder at prior imaging  EXAM: [place] LIMITED - RIGHT UPPER QUADRANT  COMPARISON:  CT same date  FINDINGS: Gallbladder:  Gallbladder wall thickness 3 mm. No pericholecystic  fluid. Dependent sludge. Gallbladder is distended.  Common bile duct:  Diameter: 2 mm  Liver:  Hepatic increased echogenicity likely indicates underlying steatosis without focal abnormality or intrahepatic ductal dilatation.  IMPRESSION: Gallbladder distention, wall thickness at upper limits of normal, and dependent sludge, which may be seen with acute cholecystitis.  Hepatic increased echogenicity likely indicates underlying steatosis without focal abnormality or intrahepatic ductal dilatation.   Electronically Signed   By: Conchita Paris M.D.   On: 09/06/2014 21:59   Dg Abd Acute W/chest  09/06/2014   CLINICAL DATA:  Acute onset of epigastric abdominal pain, nausea and vomiting earlier today. Personal history of colon cancer and abdominal wall hernia.  EXAM: DG ABDOMEN ACUTE W/ 1V CHEST  COMPARISON:  CT abdomen and pelvis 07/18/2014. Two-view chest x-ray 09/26/2013.  FINDINGS: Bowel gas pattern unremarkable without evidence of obstruction or significant ileus. No  evidence of free air or significant air-fluid levels on the erect image. Expected stool burden in the colon. Surgical anastomotic suture material in the left upper quadrant. Phleboliths low in the left side of the pelvis. No visible opaque urinary tract calculi. Slight thoracolumbar scoliosis convex right and mild degenerative changes involving the thoracic and lumbar spine.  Cardiac silhouette normal in size, unchanged. Mild atherosclerosis involving the thoracic aorta, unchanged. Hilar and mediastinal contours otherwise unremarkable. Stable chronic elevation of the right hemidiaphragm. Linear atelectasis at the left lung base. Lungs otherwise clear. No pleural effusions.  IMPRESSION: 1. No acute abdominal abnormality. 2. Mild linear atelectasis at the left lung base. No acute cardiopulmonary disease otherwise.   Electronically Signed   By: Evangeline Dakin M.D.   On: 09/06/2014 17:55    Medications / Allergies:  Scheduled Meds: . sodium chloride   Intravenous STAT  . budesonide  9 mg Oral Daily  . carvedilol  6.25 mg Oral BID  . docusate sodium  100 mg Oral BID  . folic acid  1 mg Oral Daily  . insulin aspart  0-15 Units Subcutaneous 6 times per day  . losartan  100 mg Oral Daily  . morphine      . multivitamin with minerals  1 tablet Oral Daily  . piperacillin-tazobactam (ZOSYN)  IV  3.375 g Intravenous 3 times per day  . spironolactone  25 mg Oral Daily  . thiamine  100 mg Oral Daily   Continuous Infusions: . dextrose 5 % and 0.9% NaCl 50 mL/hr at 09/07/14 0040  . heparin 1,400 Units/hr (09/07/14 0632)   PRN Meds:.acetaminophen, HYDROmorphone (DILAUDID) injection, ondansetron **OR** ondansetron (ZOFRAN) IV  Antibiotics: Anti-infectives    Start     Dose/Rate Route Frequency Ordered Stop   09/07/14 0600  piperacillin-tazobactam (ZOSYN) IVPB 3.375 g     3.375 g 12.5 mL/hr over 240 Minutes Intravenous 3 times per day 09/07/14 0052     09/06/14 2015  piperacillin-tazobactam (ZOSYN) IVPB  3.375 g     3.375 g 12.5 mL/hr over 240 Minutes Intravenous  Once 09/06/14 2014 09/06/14 2055        Assessment/Plan Epigastric abdominal pain-HIDA scan today.  Further recommendations based on the results.  If positive, will need cardiology evaluation. ID-zosyn for suspected cholecystitis  VTE prophylaxis-on Xarelto at home for peripheral artery disease, now on heparin gtt.  Hx adenocarcinoma of colon s/p lap assisted left colectomy---Dr. Hassell Done  03/28/2013 FEN-keep NPO for now    Erby Pian, [facility] Surgery Pager [phone])   09/07/2014 8:48 AM

## 2014-09-07 NOTE — Progress Notes (Signed)
   HIDA positive for acute cholecystitis.  Cardiology called to consult. Will await recs before definitive surgical recommendations are made.    Brodi Nery, ANP-BC

## 2014-09-07 NOTE — Progress Notes (Signed)
ANTIBIOTIC CONSULT NOTE - INITIAL  Pharmacy Consult for Zosyn Indication: Intra-abdominal infection  Allergies  Allergen Reactions  . Amlodipine Swelling    Right LE edema    Patient Measurements: Height: 5' 10"  (177.8 cm) Weight: 193 lb (87.544 kg) IBW/kg (Calculated) : 73 Adjusted Body Weight:   Vital Signs: Temp: 98.2 F (36.8 C) (09/01 2237) Temp Source: Oral (09/01 2237) BP: 156/79 mmHg (09/01 2237) Pulse Rate: 95 (09/01 2237) Intake/Output from previous day:   Intake/Output from this shift:    Labs:  Recent Labs  09/06/14 1700  WBC 14.1*  HGB 14.2  PLT 304  CREATININE 1.17   Estimated Creatinine Clearance: 61.5 mL/min (by C-G formula based on Cr of 1.17). No results for input(s): VANCOTROUGH, VANCOPEAK, VANCORANDOM, GENTTROUGH, GENTPEAK, GENTRANDOM, TOBRATROUGH, TOBRAPEAK, TOBRARND, AMIKACINPEAK, AMIKACINTROU, AMIKACIN in the last 72 hours.   Microbiology: No results found for this or any previous visit (from the past 720 hour(s)).  Medical History: Past Medical History  Diagnosis Date  . Diarrhea   . Hypertension   . Ulcerative colitis     history  . Anemia of chronic disease 2015    "related to cancer tx" (08/09/2013)  . Arthritis     "thumb joints" (08/09/2013)  . Kidney stones X 2    "passed them both"  . Peripheral vascular disease   . Colon cancer 03/2013    Medications:  Anti-infectives    Start     Dose/Rate Route Frequency Ordered Stop   09/07/14 0600  piperacillin-tazobactam (ZOSYN) IVPB 3.375 g     3.375 g 12.5 mL/hr over 240 Minutes Intravenous 3 times per day 09/07/14 0052     09/06/14 2015  piperacillin-tazobactam (ZOSYN) IVPB 3.375 g     3.375 g 12.5 mL/hr over 240 Minutes Intravenous  Once 09/06/14 2014 09/06/14 2055     Assessment: Patient with possible cholecystitis per CT.  HIDA today to confirm.  Zosyn started empirically, and heparin as bridge for PTA rivaroxaban.   First dose of antibiotics already given.  Goal of  Therapy:  Zosyn based on renal function   Plan:  Follow up culture results  Zosyn 3.375g IV Q8H infused over 4hrs.   Cody Suarez, Cody Suarez 09/07/2014,12:59 AM

## 2014-09-08 ENCOUNTER — Inpatient Hospital Stay (HOSPITAL_COMMUNITY): Payer: 59

## 2014-09-08 ENCOUNTER — Inpatient Hospital Stay (HOSPITAL_COMMUNITY): Payer: 59 | Admitting: Certified Registered"

## 2014-09-08 ENCOUNTER — Encounter (HOSPITAL_COMMUNITY): Payer: Self-pay | Admitting: Certified Registered"

## 2014-09-08 ENCOUNTER — Encounter (HOSPITAL_COMMUNITY)
Admission: EM | Disposition: A | Discharge status: Home / Self Care | Payer: Self-pay | Source: Home / Self Care | Attending: Internal Medicine

## 2014-09-08 DIAGNOSIS — R079 Chest pain, unspecified: Secondary | ICD-10-CM

## 2014-09-08 HISTORY — PX: CHOLECYSTECTOMY: SHX55

## 2014-09-08 LAB — COMPREHENSIVE METABOLIC PANEL
ALBUMIN: 3.1 g/dL — AB (ref 3.5–5.0)
ALK PHOS: 30 U/L — AB (ref 38–126)
ALT: 19 U/L (ref 17–63)
ANION GAP: 5 (ref 5–15)
AST: 17 U/L (ref 15–41)
BUN: 13 mg/dL (ref 6–20)
CALCIUM: 8.3 mg/dL — AB (ref 8.9–10.3)
CO2: 25 mmol/L (ref 22–32)
Chloride: 102 mmol/L (ref 101–111)
Creatinine, Ser: 1.29 mg/dL — ABNORMAL HIGH (ref 0.61–1.24)
GFR calc non Af Amer: 55 mL/min — ABNORMAL LOW (ref >60)
Glucose, Bld: 115 mg/dL — ABNORMAL HIGH (ref 65–99)
POTASSIUM: 3.7 mmol/L (ref 3.5–5.1)
SODIUM: 132 mmol/L — AB (ref 135–145)
Total Bilirubin: 0.9 mg/dL (ref 0.3–1.2)
Total Protein: 6.4 g/dL — ABNORMAL LOW (ref 6.5–8.1)

## 2014-09-08 LAB — CBC
HCT: 38.4 % — ABNORMAL LOW (ref 39.0–52.0)
HEMOGLOBIN: 12.7 g/dL — AB (ref 13.0–17.0)
MCH: 31.5 pg (ref 26.0–34.0)
MCHC: 33.1 g/dL (ref 30.0–36.0)
MCV: 95.3 fL (ref 78.0–100.0)
PLATELETS: 262 10*3/uL (ref 150–400)
RBC: 4.03 MIL/uL — ABNORMAL LOW (ref 4.22–5.81)
RDW: 14.3 % (ref 11.5–15.5)
WBC: 12.2 10*3/uL — ABNORMAL HIGH (ref 4.0–10.5)

## 2014-09-08 LAB — GLUCOSE, CAPILLARY
GLUCOSE-CAPILLARY: 142 mg/dL — AB (ref 65–99)
Glucose-Capillary: 137 mg/dL — ABNORMAL HIGH (ref 65–99)
Glucose-Capillary: 102 mg/dL — ABNORMAL HIGH (ref 65–99)
Glucose-Capillary: 106 mg/dL — ABNORMAL HIGH (ref 65–99)
Glucose-Capillary: 134 mg/dL — ABNORMAL HIGH (ref 65–99)

## 2014-09-08 LAB — OSMOLALITY: OSMOLALITY: 288 mosm/kg (ref 275–300)

## 2014-09-08 LAB — HEPARIN LEVEL (UNFRACTIONATED): Heparin Unfractionated: 0.77 IU/mL — ABNORMAL HIGH (ref 0.30–0.70)

## 2014-09-08 LAB — HEMOGLOBIN A1C
Hgb A1c MFr Bld: 6.3 % — ABNORMAL HIGH (ref 4.8–5.6)
Mean Plasma Glucose: 134 mg/dL

## 2014-09-08 LAB — URIC ACID: Uric Acid, Serum: 3.3 mg/dL — ABNORMAL LOW (ref 4.4–7.6)

## 2014-09-08 LAB — APTT: APTT: 87 s — AB (ref 24–37)

## 2014-09-08 SURGERY — LAPAROSCOPIC CHOLECYSTECTOMY WITH INTRAOPERATIVE CHOLANGIOGRAM
Anesthesia: General | Site: Abdomen

## 2014-09-08 MED ORDER — ONDANSETRON HCL 4 MG/2ML IJ SOLN: 4.0000 mg | Freq: Four times a day (QID) | INTRAMUSCULAR | Status: DC | PRN

## 2014-09-08 MED ORDER — BUPIVACAINE-EPINEPHRINE 0.25% -1:200000 IJ SOLN
INTRAMUSCULAR | Status: DC | PRN
  Administered 2014-09-08: 20 mL

## 2014-09-08 MED ORDER — PHENYLEPHRINE 40 MCG/ML (10ML) SYRINGE FOR IV PUSH (FOR BLOOD PRESSURE SUPPORT)
PREFILLED_SYRINGE | INTRAVENOUS | Status: AC
  Filled 2014-09-08: qty 10

## 2014-09-08 MED ORDER — HYDROCODONE-ACETAMINOPHEN 5-325 MG PO TABS
1.0000 | ORAL_TABLET | ORAL | Status: DC | PRN
  Administered 2014-09-09: 2 via ORAL
  Filled 2014-09-08: qty 2

## 2014-09-08 MED ORDER — MEPERIDINE HCL 25 MG/ML IJ SOLN: 6.2500 mg | INTRAMUSCULAR | Status: DC | PRN

## 2014-09-08 MED ORDER — FENTANYL CITRATE (PF) 100 MCG/2ML IJ SOLN
INTRAMUSCULAR | Status: DC | PRN
  Administered 2014-09-08 (×2): 50 ug via INTRAVENOUS
  Administered 2014-09-08: 100 ug via INTRAVENOUS

## 2014-09-08 MED ORDER — DEXAMETHASONE SODIUM PHOSPHATE 10 MG/ML IJ SOLN
INTRAMUSCULAR | Status: DC | PRN
  Administered 2014-09-08: 10 mg via INTRAVENOUS

## 2014-09-08 MED ORDER — HYDROMORPHONE HCL 1 MG/ML IJ SOLN
0.2500 mg | INTRAMUSCULAR | Status: DC | PRN
  Administered 2014-09-08 (×2): 0.5 mg via INTRAVENOUS

## 2014-09-08 MED ORDER — BUPIVACAINE-EPINEPHRINE (PF) 0.25% -1:200000 IJ SOLN
INTRAMUSCULAR | Status: AC
  Filled 2014-09-08: qty 30

## 2014-09-08 MED ORDER — PHENYLEPHRINE HCL 10 MG/ML IJ SOLN
INTRAMUSCULAR | Status: DC | PRN
  Administered 2014-09-08 (×2): 80 ug via INTRAVENOUS

## 2014-09-08 MED ORDER — NEOSTIGMINE METHYLSULFATE 10 MG/10ML IV SOLN
INTRAVENOUS | Status: DC | PRN
  Administered 2014-09-08: 4 mg via INTRAVENOUS

## 2014-09-08 MED ORDER — PROPOFOL 10 MG/ML IV BOLUS
INTRAVENOUS | Status: DC | PRN
  Administered 2014-09-08: 130 mg via INTRAVENOUS

## 2014-09-08 MED ORDER — ONDANSETRON HCL 4 MG/2ML IJ SOLN
INTRAMUSCULAR | Status: DC | PRN
  Administered 2014-09-08: 4 mg via INTRAVENOUS

## 2014-09-08 MED ORDER — ACETAMINOPHEN 650 MG RE SUPP: 650.0000 mg | Freq: Four times a day (QID) | RECTAL | Status: DC | PRN

## 2014-09-08 MED ORDER — FENTANYL CITRATE (PF) 250 MCG/5ML IJ SOLN
INTRAMUSCULAR | Status: AC
  Filled 2014-09-08: qty 25

## 2014-09-08 MED ORDER — DEXTROSE-NACL 5-0.9 % IV SOLN: INTRAVENOUS | Status: DC

## 2014-09-08 MED ORDER — HYDROMORPHONE HCL 1 MG/ML IJ SOLN
1.0000 mg | INTRAMUSCULAR | Status: DC | PRN
  Administered 2014-09-08: 1 mg via INTRAVENOUS
  Filled 2014-09-08: qty 1

## 2014-09-08 MED ORDER — LACTATED RINGERS IV SOLN
INTRAVENOUS | Status: DC | PRN
  Administered 2014-09-08: 16:00:00 via INTRAVENOUS

## 2014-09-08 MED ORDER — SODIUM CHLORIDE 0.9 % IV SOLN
INTRAVENOUS | Status: AC
  Administered 2014-09-08: 14:00:00 via INTRAVENOUS

## 2014-09-08 MED ORDER — ONDANSETRON HCL 4 MG/2ML IJ SOLN
INTRAMUSCULAR | Status: AC
  Filled 2014-09-08: qty 2

## 2014-09-08 MED ORDER — 0.9 % SODIUM CHLORIDE (POUR BTL) OPTIME
TOPICAL | Status: DC | PRN
  Administered 2014-09-08: 1000 mL

## 2014-09-08 MED ORDER — SUCCINYLCHOLINE CHLORIDE 20 MG/ML IJ SOLN
INTRAMUSCULAR | Status: DC | PRN
  Administered 2014-09-08: 100 mg via INTRAVENOUS

## 2014-09-08 MED ORDER — PIPERACILLIN-TAZOBACTAM 3.375 G IVPB: 3.3750 g | Freq: Three times a day (TID) | INTRAVENOUS | Status: DC

## 2014-09-08 MED ORDER — GLYCOPYRROLATE 0.2 MG/ML IJ SOLN
INTRAMUSCULAR | Status: AC
  Filled 2014-09-08: qty 3

## 2014-09-08 MED ORDER — HEPARIN (PORCINE) IN NACL 100-0.45 UNIT/ML-% IJ SOLN
1300.0000 [IU]/h | INTRAMUSCULAR | Status: DC
  Administered 2014-09-09: 1300 [IU]/h via INTRAVENOUS
  Filled 2014-09-08: qty 250

## 2014-09-08 MED ORDER — HYDROMORPHONE HCL 1 MG/ML IJ SOLN
INTRAMUSCULAR | Status: AC
  Filled 2014-09-08: qty 1

## 2014-09-08 MED ORDER — LACTATED RINGERS IV SOLN: INTRAVENOUS | Status: DC

## 2014-09-08 MED ORDER — IOHEXOL 300 MG/ML  SOLN
INTRAMUSCULAR | Status: DC | PRN
  Administered 2014-09-08: 20 mL

## 2014-09-08 MED ORDER — MIDAZOLAM HCL 5 MG/5ML IJ SOLN
INTRAMUSCULAR | Status: DC | PRN
  Administered 2014-09-08: 2 mg via INTRAVENOUS

## 2014-09-08 MED ORDER — PROMETHAZINE HCL 25 MG/ML IJ SOLN: 6.2500 mg | INTRAMUSCULAR | Status: DC | PRN

## 2014-09-08 MED ORDER — PROPOFOL 10 MG/ML IV BOLUS
INTRAVENOUS | Status: AC
  Filled 2014-09-08: qty 20

## 2014-09-08 MED ORDER — DEXAMETHASONE SODIUM PHOSPHATE 10 MG/ML IJ SOLN
INTRAMUSCULAR | Status: AC
  Filled 2014-09-08: qty 1

## 2014-09-08 MED ORDER — ONDANSETRON 4 MG PO TBDP: 4.0000 mg | ORAL_TABLET | Freq: Four times a day (QID) | ORAL | Status: DC | PRN

## 2014-09-08 MED ORDER — ROCURONIUM BROMIDE 100 MG/10ML IV SOLN
INTRAVENOUS | Status: AC
  Filled 2014-09-08: qty 1

## 2014-09-08 MED ORDER — LACTATED RINGERS IR SOLN
Status: DC | PRN
  Administered 2014-09-08: 1

## 2014-09-08 MED ORDER — ROCURONIUM BROMIDE 100 MG/10ML IV SOLN
INTRAVENOUS | Status: DC | PRN
  Administered 2014-09-08: 10 mg via INTRAVENOUS
  Administered 2014-09-08: 30 mg via INTRAVENOUS

## 2014-09-08 MED ORDER — KCL IN DEXTROSE-NACL 20-5-0.45 MEQ/L-%-% IV SOLN
INTRAVENOUS | Status: DC
  Administered 2014-09-08 – 2014-09-09 (×2): via INTRAVENOUS
  Filled 2014-09-08 (×3): qty 1000

## 2014-09-08 MED ORDER — GLYCOPYRROLATE 0.2 MG/ML IJ SOLN
INTRAMUSCULAR | Status: DC | PRN
  Administered 2014-09-08: 0.6 mg via INTRAVENOUS

## 2014-09-08 MED ORDER — ACETAMINOPHEN 325 MG PO TABS: 650.0000 mg | ORAL_TABLET | Freq: Four times a day (QID) | ORAL | Status: DC | PRN

## 2014-09-08 MED ORDER — NEOSTIGMINE METHYLSULFATE 10 MG/10ML IV SOLN
INTRAVENOUS | Status: AC
  Filled 2014-09-08: qty 1

## 2014-09-08 MED ORDER — MIDAZOLAM HCL 2 MG/2ML IJ SOLN
INTRAMUSCULAR | Status: AC
  Filled 2014-09-08: qty 4

## 2014-09-08 MED ORDER — HYDRALAZINE HCL 20 MG/ML IJ SOLN: 10.0000 mg | Freq: Four times a day (QID) | INTRAMUSCULAR | Status: DC | PRN

## 2014-09-08 MED ORDER — LIDOCAINE HCL (PF) 2 % IJ SOLN
INTRAMUSCULAR | Status: DC | PRN
  Administered 2014-09-08: 30 mg via INTRADERMAL

## 2014-09-08 SURGICAL SUPPLY — 33 items
APPLIER CLIP ROT 10 11.4 M/L (STAPLE) ×3
BENZOIN TINCTURE PRP APPL 2/3 (GAUZE/BANDAGES/DRESSINGS) ×3 IMPLANT
CABLE HIGH FREQUENCY MONO STRZ (ELECTRODE) ×3 IMPLANT
CHLORAPREP W/TINT 26ML (MISCELLANEOUS) ×3 IMPLANT
CLIP APPLIE ROT 10 11.4 M/L (STAPLE) ×1 IMPLANT
CLOSURE STERI-STRIP 1/4X4 (GAUZE/BANDAGES/DRESSINGS) ×3 IMPLANT
CLOSURE WOUND 1/2 X4 (GAUZE/BANDAGES/DRESSINGS) ×1
COVER MAYO STAND STRL (DRAPES) ×3 IMPLANT
COVER SURGICAL LIGHT HANDLE (MISCELLANEOUS) ×3 IMPLANT
DECANTER SPIKE VIAL GLASS SM (MISCELLANEOUS) ×3 IMPLANT
DRAPE C-ARM 42X120 X-RAY (DRAPES) ×3 IMPLANT
DRAPE LAPAROSCOPIC ABDOMINAL (DRAPES) ×3 IMPLANT
ELECT REM PT RETURN 9FT ADLT (ELECTROSURGICAL) ×3
ELECTRODE REM PT RTRN 9FT ADLT (ELECTROSURGICAL) ×1 IMPLANT
GAUZE SPONGE 2X2 8PLY STRL LF (GAUZE/BANDAGES/DRESSINGS) ×1 IMPLANT
GLOVE SURG ORTHO 8.0 STRL STRW (GLOVE) ×3 IMPLANT
GOWN STRL REUS W/TWL XL LVL3 (GOWN DISPOSABLE) ×6 IMPLANT
HEMOSTAT SURGICEL 4X8 (HEMOSTASIS) IMPLANT
KIT BASIN OR (CUSTOM PROCEDURE TRAY) ×3 IMPLANT
POUCH SPECIMEN RETRIEVAL 10MM (ENDOMECHANICALS) ×3 IMPLANT
SCISSORS LAP 5X35 DISP (ENDOMECHANICALS) ×3 IMPLANT
SET CHOLANGIOGRAPH MIX (MISCELLANEOUS) ×3 IMPLANT
SET IRRIG TUBING LAPAROSCOPIC (IRRIGATION / IRRIGATOR) ×3 IMPLANT
SLEEVE XCEL OPT CAN 5 100 (ENDOMECHANICALS) ×3 IMPLANT
SPONGE GAUZE 2X2 STER 10/PKG (GAUZE/BANDAGES/DRESSINGS) ×2
STRIP CLOSURE SKIN 1/2X4 (GAUZE/BANDAGES/DRESSINGS) ×2 IMPLANT
SUT MNCRL AB 4-0 PS2 18 (SUTURE) ×3 IMPLANT
TAPE CLOTH SURG 4X10 WHT LF (GAUZE/BANDAGES/DRESSINGS) ×3 IMPLANT
TOWEL OR 17X26 10 PK STRL BLUE (TOWEL DISPOSABLE) ×3 IMPLANT
TRAY LAPAROSCOPIC (CUSTOM PROCEDURE TRAY) ×3 IMPLANT
TROCAR BLADELESS OPT 5 100 (ENDOMECHANICALS) ×3 IMPLANT
TROCAR XCEL BLUNT TIP 100MML (ENDOMECHANICALS) ×3 IMPLANT
TROCAR XCEL NON-BLD 11X100MML (ENDOMECHANICALS) ×3 IMPLANT

## 2014-09-08 NOTE — Progress Notes (Signed)
Patient Demographics:    Cody Suarez, is a 69 y.o. male, DOB - [birth year], ZHY:[id number]  Admit date - 09/06/2014   Admitting Physician Lavina Hamman, MD  Outpatient Primary MD for the patient is [place], Malka So, MD  LOS - 2   Chief Complaint  Patient presents with  . Abdominal Pain  . Emesis        Subjective:    Sergio Zawislak today has, No headache, No chest pain, No abdominal pain - No Nausea, No new weakness tingling or numbness, No Cough - SOB.    Assessment  & Plan :      1. Acute cholecystitis. General surgery on board, planning to take him to OR later today. Kindly see cardiology note for perioperative risk stratification. Currently nothing by mouth with gentle IV fluids.On Zosyn for now.    2. History of DVT and lower descending aorta thrombus in 2015 - on xaralto, currently held and patient on heparin drip. We'll request general surgery to hold heparin drip prior to surgery as needed and then resume as soon as possible. Will likely resume xaralto 24 hours postop. Appreciate cardiology input.    3. History of colon cancer status post colon resection. Stable supportive care.   4. PAD. No acute issues continue anti-coag ablation as above, 10 place on statin prior to discharge for secondary prevention.    5. Essential hypertension. Continue home dose Coreg along with as needed hydralazine.   6. ARF. Increase IV fluids to normal saline 100 mL an hour, hold ARB Aldactone. Obtain urine electrolytes.   7. Hyponatremia. Could be dehydrated. Urine electrolytes as above, hydration as above, repeat BMP in the morning.     Code Status : Full  Family Communication  : None present  Disposition Plan  : Likely home 24 hours after surgery  Consults  :  Cardiology,  surgery  Procedures  :   CT abdomen, right upper quadrant ultrasound, HIDA scan. All pointing towards acute cholecystitis  2-D echogram. With mild AI, preserved EF of 60%  no wall motion abnormality.   DVT Prophylaxis  :   - Heparin    Lab Results  Component Value Date   PLT 262 09/08/2014    Inpatient Medications  Scheduled Meds: . budesonide  9 mg Oral Daily  . carvedilol  6.25 mg Oral BID  . chlorhexidine  1 application Topical Once  . docusate sodium  100 mg Oral BID  . folic acid  1 mg Oral Daily  . insulin aspart  0-15 Units Subcutaneous 6 times per day  . losartan  100 mg Oral Daily  . multivitamin with minerals  1 tablet Oral Daily  . piperacillin-tazobactam (ZOSYN)  IV  3.375 g Intravenous 3 times per day  . spironolactone  25 mg Oral Daily  . thiamine  100 mg Oral Daily   Continuous Infusions: . dextrose 5 % and 0.9% NaCl    . heparin Stopped (09/08/14 1018)   PRN Meds:.acetaminophen, HYDROmorphone (DILAUDID) injection, ondansetron **OR** ondansetron (ZOFRAN) IV, technetium TC 57M mebrofenin  Antibiotics  :     Anti-infectives    Start     Dose/Rate Route Frequency Ordered Stop   09/07/14 0600  piperacillin-tazobactam (ZOSYN) IVPB 3.375 g  3.375 g 12.5 mL/hr over 240 Minutes Intravenous 3 times per day 09/07/14 0052     09/06/14 2015  piperacillin-tazobactam (ZOSYN) IVPB 3.375 g     3.375 g 12.5 mL/hr over 240 Minutes Intravenous  Once 09/06/14 2014 09/06/14 2055        Objective:   Filed Vitals:   09/07/14 0840 09/07/14 1437 09/07/14 2041 09/08/14 0445  BP: 128/73 128/67 131/62 116/76  Pulse: 88 77 73 66  Temp: 98.4 F (36.9 C) 98.3 F (36.8 C) 98.5 F (36.9 C) 98.1 F (36.7 C)  TempSrc: Oral Oral Oral Oral  Resp: 16 18 18 18   Height:      Weight:      SpO2: 97% 94% 93% 97%    Wt Readings from Last 3 Encounters:  09/06/14 87.544 kg (193 lb)  07/23/14 89.994 kg (198 lb 6.4 oz)  07/17/14 88.905 kg (196 lb)     Intake/Output  Summary (Last 24 hours) at 09/08/14 1313 Last data filed at 09/08/14 0700  Gross per 24 hour  Intake 1516.66 ml  Output    450 ml  Net 1066.66 ml     Physical Exam  Awake Alert, Oriented X 3, No new F.N deficits, Normal affect [place].AT,PERRAL Supple Neck,No JVD, No cervical lymphadenopathy appriciated.  Symmetrical Chest wall movement, Good air movement bilaterally, CTAB RRR,No Gallops,Rubs or new Murmurs, No Parasternal Heave +ve B.Sounds, Abd Soft, No tenderness, No organomegaly appriciated, No rebound - guarding or rigidity. No Cyanosis, Clubbing or edema, No new Rash or bruise       Data Review:   Micro Results Recent Results (from the past 240 hour(s))  Surgical pcr screen     Status: None   Collection Time: 09/07/14  6:06 PM  Result Value Ref Range Status   MRSA, PCR NEGATIVE NEGATIVE Final   Staphylococcus aureus NEGATIVE NEGATIVE Final    Comment:        The Xpert SA Assay (FDA approved for NASAL specimens in patients over 40 years of age), is one component of a comprehensive surveillance program.  Test performance has been validated by [facility] for patients greater than or equal to 86 year old. It is not intended to diagnose infection nor to guide or monitor treatment.     Radiology Reports Nm Hepatobiliary Including Gb  09/07/2014   CLINICAL DATA:  Evaluate for acute cholecystitis.  EXAM: NUCLEAR MEDICINE HEPATOBILIARY IMAGING  TECHNIQUE: Sequential images of the abdomen were obtained out to 60 minutes following intravenous administration of radiopharmaceutical.  RADIOPHARMACEUTICALS:  5.04 mCi Tc-18m Choletec IV  COMPARISON:  Ultrasound 09/06/2014; CT 09/06/2014  FINDINGS: Normal uptake of radiotracer within the liver. Radiotracer is excreted into biliary system and small bowel. There is non opacification of the gallbladder consistent with cystic duct obstruction.  IMPRESSION: Not opacification of the gallbladder compatible with cystic duct obstruction.  Findings are concerning for acute cholecystitis.  These results will be called to the ordering clinician or representative by the Radiologist Assistant, and communication documented in the PACS or zVision Dashboard.   Electronically Signed   By: DLovey NewcomerM.D.   On: 09/07/2014 14:49   Ct Abdomen Pelvis W Contrast  09/06/2014   CLINICAL DATA:  Acute onset of mid upper abdominal pain, nausea and vomiting. Initial encounter.  EXAM: CT ABDOMEN AND PELVIS WITH CONTRAST  TECHNIQUE: Multidetector CT imaging of the abdomen and pelvis was performed using the standard protocol following bolus administration of intravenous contrast.  CONTRAST:  1030mOMNIPAQUE IOHEXOL  300 MG/ML  SOLN  COMPARISON:  CT of the chest, abdomen and pelvis from 07/18/2014  FINDINGS: Mild bibasilar atelectasis is noted.  The liver and spleen are unremarkable in appearance. Mild wall thickening and stranding is noted about the gallbladder, raising question for mild acute cholecystitis. The pancreas and adrenal glands are unremarkable.  Nonobstructing right renal stones are seen, measuring up to 5 mm in size. Mild nonspecific perinephric stranding is noted bilaterally. The kidneys are otherwise unremarkable. There is no evidence of hydronephrosis. No obstructing ureteral stones are seen.  No free fluid is identified. The small bowel is unremarkable in appearance. The stomach is within normal limits. No acute vascular abnormalities are seen. Scattered calcification is noted along the abdominal aorta and its branches.  The appendix is normal in caliber, without evidence of appendicitis. Postoperative change is noted at the descending colon. The colon is largely decompressed.  The bladder is mildly distended and grossly unremarkable. The prostate remains normal in size. No inguinal lymphadenopathy is seen.  No acute osseous abnormalities are identified. Vacuum phenomenon is noted at L5-S1.  IMPRESSION: 1. Mild wall thickening and stranding about the  gallbladder, raising question for mild acute cholecystitis. 2. Nonobstructing right renal stones measure up to 5 mm in size. 3. Mild bibasilar atelectasis noted. 4. Scattered calcification along the abdominal aorta and its branches.   Electronically Signed   By: Garald Balding M.D.   On: 09/06/2014 19:53   US Abdomen Limited  09/06/2014   CLINICAL DATA:  Epigastric abdominal pain for 12 hours, abnormal gallbladder at prior imaging  EXAM: US ABDOMEN LIMITED - RIGHT UPPER QUADRANT  COMPARISON:  CT same date  FINDINGS: Gallbladder:  Gallbladder wall thickness 3 mm. No pericholecystic fluid. Dependent sludge. Gallbladder is distended.  Common bile duct:  Diameter: 2 mm  Liver:  Hepatic increased echogenicity likely indicates underlying steatosis without focal abnormality or intrahepatic ductal dilatation.  IMPRESSION: Gallbladder distention, wall thickness at upper limits of normal, and dependent sludge, which may be seen with acute cholecystitis.  Hepatic increased echogenicity likely indicates underlying steatosis without focal abnormality or intrahepatic ductal dilatation.   Electronically Signed   By: Conchita Paris M.D.   On: 09/06/2014 21:59   Dg Abd Acute W/chest  09/06/2014   CLINICAL DATA:  Acute onset of epigastric abdominal pain, nausea and vomiting earlier today. Personal history of colon cancer and abdominal wall hernia.  EXAM: DG ABDOMEN ACUTE W/ 1V CHEST  COMPARISON:  CT abdomen and pelvis 07/18/2014. Two-view chest x-ray 09/26/2013.  FINDINGS: Bowel gas pattern unremarkable without evidence of obstruction or significant ileus. No evidence of free air or significant air-fluid levels on the erect image. Expected stool burden in the colon. Surgical anastomotic suture material in the left upper quadrant. Phleboliths low in the left side of the pelvis. No visible opaque urinary tract calculi. Slight thoracolumbar scoliosis convex right and mild degenerative changes involving the thoracic and lumbar  spine.  Cardiac silhouette normal in size, unchanged. Mild atherosclerosis involving the thoracic aorta, unchanged. Hilar and mediastinal contours otherwise unremarkable. Stable chronic elevation of the right hemidiaphragm. Linear atelectasis at the left lung base. Lungs otherwise clear. No pleural effusions.  IMPRESSION: 1. No acute abdominal abnormality. 2. Mild linear atelectasis at the left lung base. No acute cardiopulmonary disease otherwise.   Electronically Signed   By: Evangeline Dakin M.D.   On: 09/06/2014 17:55     CBC  Recent Labs Lab 09/06/14 1700 09/07/14 0530 09/08/14 0556  WBC 14.1* 13.4*  12.2*  HGB 14.2 14.4 12.7*  HCT 41.8 43.0 38.4*  PLT 304 303 262  MCV 92.9 94.7 95.3  MCH 31.6 31.7 31.5  MCHC 34.0 33.5 33.1  RDW 14.1 14.5 14.3  LYMPHSABS 1.7  --   --   MONOABS 1.4*  --   --   EOSABS 0.1  --   --   BASOSABS 0.1  --   --     Chemistries   Recent Labs Lab 09/06/14 1700 09/07/14 0530 09/08/14 0556  NA 135 136 132*  K 3.9 3.9 3.7  CL 99* 100* 102  CO2 25 26 25   GLUCOSE 134* 127* 115*  BUN 19 13 13   CREATININE 1.17 0.95 1.29*  CALCIUM 9.7 9.2 8.3*  AST 23 23 17   ALT 24 23 19   ALKPHOS 35* 34* 30*  BILITOT 0.8 1.1 0.9   ------------------------------------------------------------------------------------------------------------------ estimated creatinine clearance is 55.8 mL/min (by C-G formula based on Cr of 1.29). ------------------------------------------------------------------------------------------------------------------  Recent Labs  09/07/14 0530  HGBA1C 6.3*   ------------------------------------------------------------------------------------------------------------------ No results for input(s): CHOL, HDL, LDLCALC, TRIG, CHOLHDL, LDLDIRECT in the last 72 hours. ------------------------------------------------------------------------------------------------------------------  Recent Labs  09/07/14 0530  TSH 2.305    ------------------------------------------------------------------------------------------------------------------  Recent Labs  09/07/14 0530  VITAMINB12 330  FERRITIN 165  TIBC 339  IRON 20*    Coagulation profile  Recent Labs Lab 09/07/14 0530  INR 1.27    No results for input(s): DDIMER in the last 72 hours.  Cardiac Enzymes  Recent Labs Lab 09/06/14 1700  TROPONINI <0.03   ------------------------------------------------------------------------------------------------------------------ Invalid input(s): POCBNP   Time Spent in minutes   35   SINGH,PRASHANT K M.D on 09/08/2014 at 1:13 PM  Between 7am to 7pm - Pager - [phone]  After 7pm go to [email or web address] - password [facility]  [facility] -  Office  [phone]

## 2014-09-08 NOTE — Progress Notes (Signed)
Low risk findings on echocardiogram. As per Dr. Evette Georges evaluation - acceptable risk for major CV complications with planned cholecystectomy. Anticoagulation should be resumed as soon as safe from postoperative bleeding point of view, to lessen chance of recurrent embolic events. Note that the anticoagulant effect of Xarelto is virtually immediate after administration.  Sanda Klein, MD, [facility] [phone] office [phone] pager

## 2014-09-08 NOTE — Progress Notes (Signed)
Patient ID: Cody Suarez, male   DOB: February 09, 1945, 69 y.o.   MRN: [id number]  [facility]  Subjective: Patient comfortable, abd pain improved.  NPO for possible surgery today.  Objective: Vital signs in last 24 hours: Temp:  [98.1 F (36.7 C)-98.5 F (36.9 C)] 98.1 F (36.7 C) (09/03 0445) Pulse Rate:  [66-77] 66 (09/03 0445) Resp:  [18] 18 (09/03 0445) BP: (116-131)/(62-76) 116/76 mmHg (09/03 0445) SpO2:  [93 %-97 %] 97 % (09/03 0445) Last BM Date: 09/06/14  Intake/Output from previous day: 09/02 0701 - 09/03 0700 In: 1636.7 [P.O.:120; I.V.:1516.7] Out: 650 [Urine:650] Intake/Output this shift:    Physical Exam: HEENT - sclerae clear, mucous membranes moist Neck - soft Chest - clear bilaterally Cor - RRR Abdomen - soft, protuberant; mild tenderness epigastrium, no mass Ext - no edema, non-tender Neuro - alert & oriented, no focal deficits  Lab Results:   Recent Labs  09/07/14 0530 09/08/14 0556  WBC 13.4* 12.2*  HGB 14.4 12.7*  HCT 43.0 38.4*  PLT 303 262   BMET  Recent Labs  09/07/14 0530 09/08/14 0556  NA 136 132*  K 3.9 3.7  CL 100* 102  CO2 26 25  GLUCOSE 127* 115*  BUN 13 13  CREATININE 0.95 1.29*  CALCIUM 9.2 8.3*   PT/INR  Recent Labs  09/07/14 0530  LABPROT 16.0*  INR 1.27   Comprehensive Metabolic Panel:    Component Value Date/Time   NA 132* 09/08/2014 0556   NA 136 09/07/2014 0530   NA 139 07/18/2014 1302   NA 140 11/24/2013 0959   K 3.7 09/08/2014 0556   K 3.9 09/07/2014 0530   K 4.4 07/18/2014 1302   K 3.5 11/24/2013 0959   CL 102 09/08/2014 0556   CL 100* 09/07/2014 0530   CO2 25 09/08/2014 0556   CO2 26 09/07/2014 0530   CO2 25 07/18/2014 1302   CO2 23 11/24/2013 0959   BUN 13 09/08/2014 0556   BUN 13 09/07/2014 0530   BUN 17.3 07/18/2014 1302   BUN 11.4 11/24/2013 0959   CREATININE 1.29* 09/08/2014 0556   CREATININE 0.95 09/07/2014 0530   CREATININE 1.3 07/18/2014 1302    CREATININE 0.9 11/24/2013 0959   GLUCOSE 115* 09/08/2014 0556   GLUCOSE 127* 09/07/2014 0530   GLUCOSE 105 07/18/2014 1302   GLUCOSE 117 11/24/2013 0959   CALCIUM 8.3* 09/08/2014 0556   CALCIUM 9.2 09/07/2014 0530   CALCIUM 9.8 07/18/2014 1302   CALCIUM 8.9 11/24/2013 0959   AST 17 09/08/2014 0556   AST 23 09/07/2014 0530   AST 18 07/18/2014 1302   AST 13 11/24/2013 0959   ALT 19 09/08/2014 0556   ALT 23 09/07/2014 0530   ALT 22 07/18/2014 1302   ALT 18 11/24/2013 0959   ALKPHOS 30* 09/08/2014 0556   ALKPHOS 34* 09/07/2014 0530   ALKPHOS 45 07/18/2014 1302   ALKPHOS 74 11/24/2013 0959   BILITOT 0.9 09/08/2014 0556   BILITOT 1.1 09/07/2014 0530   BILITOT 0.47 07/18/2014 1302   BILITOT 0.48 11/24/2013 0959   PROT 6.4* 09/08/2014 0556   PROT 6.7 09/07/2014 0530   PROT 7.0 07/18/2014 1302   PROT 6.7 11/24/2013 0959   ALBUMIN 3.1* 09/08/2014 0556   ALBUMIN 3.7 09/07/2014 0530   ALBUMIN 3.7 07/18/2014 1302   ALBUMIN 3.3* 11/24/2013 0959    Studies/Results: Nm Hepatobiliary Including Gb  09/07/2014   CLINICAL DATA:  Evaluate for acute cholecystitis.  EXAM: NUCLEAR  MEDICINE HEPATOBILIARY IMAGING  TECHNIQUE: Sequential images of the abdomen were obtained out to 60 minutes following intravenous administration of radiopharmaceutical.  RADIOPHARMACEUTICALS:  5.04 mCi Tc-35m Choletec IV  COMPARISON:  Ultrasound 09/06/2014; CT 09/06/2014  FINDINGS: Normal uptake of radiotracer within the liver. Radiotracer is excreted into biliary system and small bowel. There is non opacification of the gallbladder consistent with cystic duct obstruction.  IMPRESSION: Not opacification of the gallbladder compatible with cystic duct obstruction. Findings are concerning for acute cholecystitis.  These results will be called to the ordering clinician or representative by the Radiologist Assistant, and communication documented in the PACS or zVision Dashboard.   Electronically Signed   By: DLovey NewcomerM.D.   On:  09/07/2014 14:49   Ct Abdomen Pelvis W Contrast  09/06/2014   CLINICAL DATA:  Acute onset of mid upper abdominal pain, nausea and vomiting. Initial encounter.  EXAM: CT ABDOMEN AND PELVIS WITH CONTRAST  TECHNIQUE: Multidetector CT imaging of the abdomen and pelvis was performed using the standard protocol following bolus administration of intravenous contrast.  CONTRAST:  1065mOMNIPAQUE IOHEXOL 300 MG/ML  SOLN  COMPARISON:  CT of the chest, abdomen and pelvis from 07/18/2014  FINDINGS: Mild bibasilar atelectasis is noted.  The liver and spleen are unremarkable in appearance. Mild wall thickening and stranding is noted about the gallbladder, raising question for mild acute cholecystitis. The pancreas and adrenal glands are unremarkable.  Nonobstructing right renal stones are seen, measuring up to 5 mm in size. Mild nonspecific perinephric stranding is noted bilaterally. The kidneys are otherwise unremarkable. There is no evidence of hydronephrosis. No obstructing ureteral stones are seen.  No free fluid is identified. The small bowel is unremarkable in appearance. The stomach is within normal limits. No acute vascular abnormalities are seen. Scattered calcification is noted along the abdominal aorta and its branches.  The appendix is normal in caliber, without evidence of appendicitis. Postoperative change is noted at the descending colon. The colon is largely decompressed.  The bladder is mildly distended and grossly unremarkable. The prostate remains normal in size. No inguinal lymphadenopathy is seen.  No acute osseous abnormalities are identified. Vacuum phenomenon is noted at L5-S1.  IMPRESSION: 1. Mild wall thickening and stranding about the gallbladder, raising question for mild acute cholecystitis. 2. Nonobstructing right renal stones measure up to 5 mm in size. 3. Mild bibasilar atelectasis noted. 4. Scattered calcification along the abdominal aorta and its branches.   Electronically Signed   By: JeGarald Balding.D.   On: 09/06/2014 19:53   UsKoreabdomen Limited  09/06/2014   CLINICAL DATA:  Epigastric abdominal pain for 12 hours, abnormal gallbladder at prior imaging  EXAM: USKoreaBDOMEN LIMITED - RIGHT UPPER QUADRANT  COMPARISON:  CT same date  FINDINGS: Gallbladder:  Gallbladder wall thickness 3 mm. No pericholecystic fluid. Dependent sludge. Gallbladder is distended.  Common bile duct:  Diameter: 2 mm  Liver:  Hepatic increased echogenicity likely indicates underlying steatosis without focal abnormality or intrahepatic ductal dilatation.  IMPRESSION: Gallbladder distention, wall thickness at upper limits of normal, and dependent sludge, which may be seen with acute cholecystitis.  Hepatic increased echogenicity likely indicates underlying steatosis without focal abnormality or intrahepatic ductal dilatation.   Electronically Signed   By: GrConchita Paris.D.   On: 09/06/2014 21:59   Dg Abd Acute W/chest  09/06/2014   CLINICAL DATA:  Acute onset of epigastric abdominal pain, nausea and vomiting earlier today. Personal history of colon cancer and abdominal wall  hernia.  EXAM: DG ABDOMEN ACUTE W/ 1V CHEST  COMPARISON:  CT abdomen and pelvis 07/18/2014. Two-view chest x-ray 09/26/2013.  FINDINGS: Bowel gas pattern unremarkable without evidence of obstruction or significant ileus. No evidence of free air or significant air-fluid levels on the erect image. Expected stool burden in the colon. Surgical anastomotic suture material in the left upper quadrant. Phleboliths low in the left side of the pelvis. No visible opaque urinary tract calculi. Slight thoracolumbar scoliosis convex right and mild degenerative changes involving the thoracic and lumbar spine.  Cardiac silhouette normal in size, unchanged. Mild atherosclerosis involving the thoracic aorta, unchanged. Hilar and mediastinal contours otherwise unremarkable. Stable chronic elevation of the right hemidiaphragm. Linear atelectasis at the left lung base. Lungs  otherwise clear. No pleural effusions.  IMPRESSION: 1. No acute abdominal abnormality. 2. Mild linear atelectasis at the left lung base. No acute cardiopulmonary disease otherwise.   Electronically Signed   By: Evangeline Dakin M.D.   On: 09/06/2014 17:55    Anti-infectives: Anti-infectives    Start     Dose/Rate Route Frequency Ordered Stop   09/07/14 0600  piperacillin-tazobactam (ZOSYN) IVPB 3.375 g     3.375 g 12.5 mL/hr over 240 Minutes Intravenous 3 times per day 09/07/14 0052     09/06/14 2015  piperacillin-tazobactam (ZOSYN) IVPB 3.375 g     3.375 g 12.5 mL/hr over 240 Minutes Intravenous  Once 09/06/14 2014 09/06/14 2055      Assessment & Plans: Acute cholecystitis, sludge  HIDA scan positive for acute cholecystitis  Clinically improved  Cleared for surgery by cardiology  Will hold heparin now and plan lap chole (possible open chole) later today if OR schedule allows  Continue NPO for surgery today  The risks and benefits of the procedure have been discussed at length with the patient.  The patient understands the proposed procedure, potential alternative treatments, and the course of recovery to be expected.  All of the patient's questions have been answered at this time.  The patient wishes to proceed with surgery.  Earnstine Regal, MD, [facility] Office: [place] 09/08/2014

## 2014-09-08 NOTE — Anesthesia Preprocedure Evaluation (Addendum)
Anesthesia Evaluation  Patient identified by MRN, date of birth, ID band Patient awake    Reviewed: Allergy & Precautions, NPO status , Patient's Chart, lab work & pertinent test results  Airway Mallampati: II  TM Distance: >3 FB Neck ROM: Full    Dental  (+) Upper Dentures   Pulmonary neg pulmonary ROS,  breath sounds clear to auscultation        Cardiovascular hypertension, Pt. on medications + Peripheral Vascular Disease and DVT Rhythm:Regular Rate:Normal     Neuro/Psych negative neurological ROS  negative psych ROS   GI/Hepatic PUD,   Endo/Other    Renal/GU CRFRenal disease  negative genitourinary   Musculoskeletal  (+) Arthritis -, Osteoarthritis,    Abdominal   Peds negative pediatric ROS (+)  Hematology   Anesthesia Other Findings   Reproductive/Obstetrics                            Lab Results  Component Value Date   WBC 12.2* 09/08/2014   HGB 12.7* 09/08/2014   HCT 38.4* 09/08/2014   MCV 95.3 09/08/2014   PLT 262 09/08/2014   Lab Results  Component Value Date   CREATININE 1.29* 09/08/2014   BUN 13 09/08/2014   NA 132* 09/08/2014   K 3.7 09/08/2014   CL 102 09/08/2014   CO2 25 09/08/2014   Lab Results  Component Value Date   INR 1.27 09/07/2014   INR 1.13 11/15/2013   INR 1.28 09/26/2013   EKG: normal sinus rhythm, frequent PVC's noted.  Echo (09/2014) Left ventricle: The cavity size was normal. Wall thickness was normal. Systolic function was normal. The estimated ejection fraction was in the range of 60% to 65%. Wall motion was normal; there were no regional wall motion abnormalities. Doppler parameters are consistent with abnormal left ventricular relaxation (grade 1 diastolic dysfunction). - Aortic valve: There was trivial regurgitation.  Anesthesia Physical Anesthesia Plan  ASA: II  Anesthesia Plan: General   Post-op Pain Management:     Induction: Intravenous  Airway Management Planned: Oral ETT  Additional Equipment:   Intra-op Plan:   Post-operative Plan: Extubation in OR  Informed Consent: I have reviewed the patients History and Physical, chart, labs and discussed the procedure including the risks, benefits and alternatives for the proposed anesthesia with the patient or authorized representative who has indicated his/her understanding and acceptance.   Dental advisory given  Plan Discussed with: CRNA  Anesthesia Plan Comments:         Anesthesia Quick Evaluation

## 2014-09-08 NOTE — Anesthesia Procedure Notes (Signed)
Procedure Name: Intubation Date/Time: 09/08/2014 4:20 PM Performed by: Lajuana Carry E Pre-anesthesia Checklist: Patient identified, Emergency Drugs available, Suction available and Patient being monitored Patient Re-evaluated:Patient Re-evaluated prior to inductionOxygen Delivery Method: Circle System Utilized Preoxygenation: Pre-oxygenation with 100% oxygen Intubation Type: IV induction Ventilation: Mask ventilation without difficulty Laryngoscope Size: Miller and 2 Grade View: Grade I Tube type: Oral Number of attempts: 1 Airway Equipment and Method: Stylet Placement Confirmation: ETT inserted through vocal cords under direct vision,  positive ETCO2 and breath sounds checked- equal and bilateral Secured at: 22 cm Tube secured with: Tape Dental Injury: Teeth and Oropharynx as per pre-operative assessment

## 2014-09-08 NOTE — Anesthesia Postprocedure Evaluation (Signed)
  Anesthesia Post-op Note  Patient: Cody Suarez  Procedure(s) Performed: Procedure(s): LAPAROSCOPIC CHOLECYSTECTOMY WITH INTRAOPERATIVE CHOLANGIOGRAM (N/A)  Patient Location: PACU  Anesthesia Type:General  Level of Consciousness: awake and alert   Airway and Oxygen Therapy: Patient Spontanous Breathing  Post-op Pain: mild  Post-op Assessment: Post-op Vital signs reviewed and Patient's Cardiovascular Status Stable              Post-op Vital Signs: Reviewed and stable  Last Vitals:  Filed Vitals:   09/08/14 1804  BP: 156/68  Pulse: 95  Temp: 37.3 C  Resp: 17    Complications: No apparent anesthesia complications

## 2014-09-08 NOTE — Op Note (Signed)
NAME:  Cody Suarez, Cody Suarez NO.:  [id number]  MEDICAL RECORD NO.:  [id number]  LOCATION:  5997                         FACILITY:  [facility]  PHYSICIAN:  Earnstine Regal, MD      DATE OF BIRTH:  06-Mar-1945  DATE OF PROCEDURE:  09/08/2014                              OPERATIVE REPORT   PREOPERATIVE DIAGNOSIS:  Acute cholecystitis.  POSTOPERATIVE DIAGNOSIS:  Acute cholecystitis.  PROCEDURE PERFORMED:  Laparoscopic cholecystectomy with intraoperative cholangiography.  SURGEON:  Earnstine Regal, MD, FACS  ASSISTANT:  [name], MD, FACS  ANESTHESIA:  General.  ESTIMATED BLOOD LOSS:  50 mL.  PREPARATION:  ChloraPrep.  COMPLICATIONS:  None.  INDICATIONS:  The patient is a 69 year old male admitted on the medical service with acute cholecystitis.  The patient was on anticoagulation for peripheral vascular disease.  After reversal of his anticoagulation and after obtaining clearance for surgery from Cardiology, the patient is prepared and brought to the operating room for cholecystectomy.  BODY OF REPORT:  Procedure is done in OR #1 at the [facility].  The patient is brought to the operating room and placed in supine position on the operating room table.  Following administration of general anesthesia, the patient is positioned, then prepped and draped in the usual aseptic fashion.  After ascertaining that an adequate level of anesthesia had been achieved, an incision is made near the left costal margin.  Using a 5 mm Optiview trocar, the peritoneal cavity is entered under direct vision and pneumoperitoneum is established.  Laparoscope is introduced.  There are adhesions to the patient's previous midline abdominal incision.  The abdominal wall inferior to the midline incision is visualized.  Incision is then made and the fascia is incised in the midline and the peritoneal cavity entered cautiously.  Pursestring suture is placed in the fascia  and Hasson cannula is introduced and secured with a pursestring suture. Pneumoperitoneum is re-established and the laparoscope is moved to the umbilical port.  Operative ports are then placed in the subxiphoid location, midclavicular location, and anterior axillary line location under direct vision.  Gallbladder is tense and distended and inflamed. It is punctured with a trocar and aspirated.  Bile is relatively clear followed by purulent fluid from within the gallbladder consistent with empyema of the gallbladder.  Gallbladder is then grasped and retracted cephalad.  Omental adhesions to the undersurface of the gallbladder are taken down and dissection is carried down to the neck of the gallbladder.  Cystic duct is dissected out along its length.  A clip is placed at the neck of the gallbladder.  Cystic duct is incised and a relatively large amount of bile under some pressure emanates from the cystic duct.  No stones are identified.  A Cook cholangiography catheter is introduced through a stab wound in the right upper quadrant.  It was inserted into the cystic duct and secured with a Ligaclip.  Using C-arm fluoroscopy, real-time cholangiography is performed.  There is filling of the common bile duct.  There is reflux of contrast into the right and left hepatic ductal systems.  They do not appear dilated.  There is not appear to be any  filling defects.  A second run of contrast is delivered through the [facility] cholangiography catheter, but no contrast flows into the distal common bile duct and there is no visualization of the duodenum.  Representative radiographs are submitted to Radiology for review.  Clip is withdrawn and Cook catheter is removed from the peritoneal cavity.  Cystic duct is triply clipped and divided.  Gallbladder is then excised from the gallbladder bed using the hook electrocautery for hemostasis.  The entire gallbladder is excised and placed into an EndoCatch bag and  withdrawn through the umbilical port and submitted to Pathology.  Right upper quadrant is copiously irrigated with warm saline which is evacuated.  Good hemostasis is noted.  Ports are removed under direct vision and good hemostasis is noted at all port sites.  Zero Vicryl pursestring suture is tied securely below the umbilicus.  Port sites are anesthetized with local anesthetic.  All wounds are closed with interrupted 4-0 Monocryl subcuticular sutures.  Wounds are washed and dried and benzoin and Steri-Strips are applied.  Sterile dressings are applied.  The patient is awakened from anesthesia and brought to the recovery room.  The patient tolerated the procedure well.   Earnstine Regal, MD, [facility] Office: [phone]    TMG/MEDQ  D:  09/08/2014  T:  09/08/2014  Job:  [id number]

## 2014-09-08 NOTE — Progress Notes (Signed)
  Echocardiogram 2D Echocardiogram has been performed.  Lysle Rubens 09/08/2014, 8:37 AM

## 2014-09-08 NOTE — Transfer of Care (Signed)
Immediate Anesthesia Transfer of Care Note  Patient: Cody Suarez  Procedure(s) Performed: Procedure(s): LAPAROSCOPIC CHOLECYSTECTOMY WITH INTRAOPERATIVE CHOLANGIOGRAM (N/A)  Patient Location: PACU  Anesthesia Type:General  Level of Consciousness:  sedated, patient cooperative and responds to stimulation  Airway & Oxygen Therapy:Patient Spontanous Breathing and Patient connected to face mask oxgen  Post-op Assessment:  Report given to PACU RN and Post -op Vital signs reviewed and stable  Post vital signs:  Reviewed and stable  Last Vitals:  Filed Vitals:   09/08/14 1513  BP: 136/66  Pulse: 66  Temp: 36.4 C  Resp: 18    Complications: No apparent anesthesia complications

## 2014-09-08 NOTE — Progress Notes (Addendum)
ANTICOAGULATION CONSULT NOTE - Follow Up Consult  Pharmacy Consult for Heparin Indication: Hx of DVT/PVD, Xarelto held  Allergies  Allergen Reactions  . Amlodipine Swelling    Right LE edema    Patient Measurements: Height: 5' 10"  (177.8 cm) Weight: 193 lb (87.544 kg) IBW/kg (Calculated) : 73 Heparin Dosing Weight: 87.5 kg  Vital Signs: Temp: 98.1 F (36.7 C) (09/03 0445) Temp Source: Oral (09/03 0445) BP: 116/76 mmHg (09/03 0445) Pulse Rate: 66 (09/03 0445)  Labs:  Recent Labs  09/06/14 1700  09/07/14 0530 09/07/14 1515 09/07/14 2200 09/08/14 0556 09/08/14 0830  HGB 14.2  --  14.4  --   --  12.7*  --   HCT 41.8  --  43.0  --   --  38.4*  --   PLT 304  --  303  --   --  262  --   APTT  --   < > 29 87* 117*  --  87*  LABPROT  --   --  16.0*  --   --   --   --   INR  --   --  1.27  --   --   --   --   HEPARINUNFRC  --   --   --  0.92* 1.04*  --  0.77*  CREATININE 1.17  --  0.95  --   --  1.29*  --   TROPONINI <0.03  --   --   --   --   --   --   < > = values in this interval not displayed.  Estimated Creatinine Clearance: 55.8 mL/min (by C-G formula based on Cr of 1.29).   Medications:  Infusions:  . dextrose 5 % and 0.9% NaCl 50 mL/hr at 09/07/14 2117  . heparin Stopped (09/08/14 1018)    Assessment: Cody Suarez admitted 9/1 with epigastric pain, possible acute cholecystitis.  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  For possible surgery, Xarelto has been placed on hold (last dose taken 9/1 at 0630).  Pharmacy is consulted to dose Heparin IV bridge.  Significant Events:  9/2: HIDA scan positive for acute cholecystitis  Today, 09/08/2014:  CBC:  Hgb decreased to 12.7, Pltc WNL at 262  APTT = 87, therapeutic on heparin infusion at 1300 units/hr  Heparin level = 0.77 , supratherapeutic value d/t likely drug-lab interaction with recent Xarelto and anti-Xa (heparin) level.  Continue to monitor both HL and APTT until both levels correlate.  RN  reports no bleeding or complications.  Goal of Therapy:  Heparin level 0.3-0.7 units/ml aPTT 66-102 seconds Monitor platelets by anticoagulation protocol: Yes   Plan:   Heparin infusion turned off ~ 1018 this AM for possible surgery later today.  Will follow-up post-op regarding resumption of anticoagulation as deemed appropriate per surgery/cardiology.   Continue to monitor H&H and platelets and for s/s of bleeding.   Lindell Spar, PharmD, BCPS Pager: [phone] 09/08/2014 11:44 AM

## 2014-09-08 NOTE — Brief Op Note (Signed)
09/06/2014 - 09/08/2014  6:01 PM  PATIENT:  Cody Suarez  69 y.o. male  PRE-OPERATIVE DIAGNOSIS:  Acute cholecystitis  POST-OPERATIVE DIAGNOSIS:  same  PROCEDURE:  Procedure(s): LAPAROSCOPIC CHOLECYSTECTOMY WITH INTRAOPERATIVE CHOLANGIOGRAM (N/A)  SURGEON:  Surgeon(s) and Role:    * Armandina Gemma, MD - Primary    * [name], MD - Assisting  ANESTHESIA:   general  EBL:     BLOOD ADMINISTERED:none  DRAINS: none   LOCAL MEDICATIONS USED:  MARCAINE     SPECIMEN:  Excision  DISPOSITION OF SPECIMEN:  PATHOLOGY  COUNTS:  YES  TOURNIQUET:  * No tourniquets in log *  DICTATION: .Other Dictation: Dictation Number [id number]  PLAN OF CARE: Admit to inpatient   PATIENT DISPOSITION:  PACU - hemodynamically stable.   Delay start of Pharmacological VTE agent (>24hrs) due to surgical blood loss or risk of bleeding: yes  Earnstine Regal, MD, [facility] Office: [phone]

## 2014-09-08 NOTE — Progress Notes (Addendum)
ANTICOAGULATION CONSULT NOTE - Follow Up Consult  Pharmacy Consult for Heparin Indication: Hx of DVT/PVD, Xarelto held   Assessment: 29 yoM admitted 9/1 with epigastric pain, possible acute cholecystitis.  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  For possible surgery, Xarelto placed on hold (last dose taken 9/1 at 0630).  Pharmacy consulted to dose Heparin IV bridge. Heparin infusion turned off ~ 1018 this AM for surgery. Patient underwent laparoscopic cholecystectomy with intraoperative cholangiogram for acute cholecystitis.   Goal of Therapy:  Heparin level 0.3-0.7 units/ml aPTT 66-102 seconds Monitor platelets by anticoagulation protocol: Yes   Plan:   Per Dr. Harlow Asa, can resume heparin infusion (no bolus) at 0600 on 9/4.  Will resume at previous rate of 1300 units/hr, as aPTT was within goal range this AM (discussed with MD).  aPTT, HL 6 hours after resumption of heparin. Continue to monitor both HL and aPTT until both levels correlate (due to recent Xarelto administration).  Daily CBC while on heparin infusion.  Continue to monitor H&H and platelets and for s/s of bleeding.  Above plan discussed with nursing.   Lindell Spar, PharmD, BCPS Pager: [phone] 09/08/2014 7:50 PM

## 2014-09-08 NOTE — Progress Notes (Signed)
ANTICOAGULATION CONSULT NOTE - Follow Up Consult  Pharmacy Consult for Heparin Indication: Hx of DVT/PVD, Xarelto held  Allergies  Allergen Reactions  . Amlodipine Swelling    Right LE edema    Patient Measurements: Height: 5' 10"  (177.8 cm) Weight: 193 lb (87.544 kg) IBW/kg (Calculated) : 73 Heparin Dosing Weight:   Vital Signs: Temp: 98.5 F (36.9 C) (09/02 2041) Temp Source: Oral (09/02 2041) BP: 131/62 mmHg (09/02 2041) Pulse Rate: 73 (09/02 2041)  Labs:  Recent Labs  09/06/14 1700 09/07/14 0530 09/07/14 1515 09/07/14 2200  HGB 14.2 14.4  --   --   HCT 41.8 43.0  --   --   PLT 304 303  --   --   APTT  --  29 87* 117*  LABPROT  --  16.0*  --   --   INR  --  1.27  --   --   HEPARINUNFRC  --   --  0.92* 1.04*  CREATININE 1.17 0.95  --   --   TROPONINI <0.03  --   --   --     Estimated Creatinine Clearance: 75.8 mL/min (by C-G formula based on Cr of 0.95).   Medications:  Infusions:  . dextrose 5 % and 0.9% NaCl 50 mL/hr at 09/07/14 2117  . heparin 1,300 Units/hr (09/08/14 0021)    Assessment: Patient with high PTT and heparin level.  No heparin issues per RN.  PTT ordered with Heparin level until both correlate due to possible drug-lab interaction between rivaroxaban and anti-Xa level (aka heparin level)   Goal of Therapy:  Heparin level 0.3-0.7 units/ml aPTT 66-102 seconds Monitor platelets by anticoagulation protocol: Yes   Plan:  Decrease heparin to 1300 units/hr Recheck PTT/heparin level at 0800  Tyler Deis, Shea Stakes Crowford 09/08/2014,1:21 AM

## 2014-09-09 DIAGNOSIS — K819 Cholecystitis, unspecified: Secondary | ICD-10-CM

## 2014-09-09 LAB — COMPREHENSIVE METABOLIC PANEL
ALBUMIN: 3.1 g/dL — AB (ref 3.5–5.0)
ALT: 63 U/L (ref 17–63)
ANION GAP: 9 (ref 5–15)
AST: 59 U/L — AB (ref 15–41)
Alkaline Phosphatase: 45 U/L (ref 38–126)
BUN: 12 mg/dL (ref 6–20)
CHLORIDE: 103 mmol/L (ref 101–111)
CO2: 22 mmol/L (ref 22–32)
Calcium: 8.3 mg/dL — ABNORMAL LOW (ref 8.9–10.3)
Creatinine, Ser: 1.08 mg/dL (ref 0.61–1.24)
GFR calc Af Amer: >60 mL/min (ref >60)
GFR calc non Af Amer: >60 mL/min (ref >60)
GLUCOSE: 170 mg/dL — AB (ref 65–99)
POTASSIUM: 4.1 mmol/L (ref 3.5–5.1)
SODIUM: 134 mmol/L — AB (ref 135–145)
Total Bilirubin: 0.6 mg/dL (ref 0.3–1.2)
Total Protein: 6.4 g/dL — ABNORMAL LOW (ref 6.5–8.1)

## 2014-09-09 LAB — CBC
HEMATOCRIT: 37.9 % — AB (ref 39.0–52.0)
HEMOGLOBIN: 12.5 g/dL — AB (ref 13.0–17.0)
MCH: 31.5 pg (ref 26.0–34.0)
MCHC: 33 g/dL (ref 30.0–36.0)
MCV: 95.5 fL (ref 78.0–100.0)
Platelets: 278 10*3/uL (ref 150–400)
RBC: 3.97 MIL/uL — ABNORMAL LOW (ref 4.22–5.81)
RDW: 14 % (ref 11.5–15.5)
WBC: 12.3 10*3/uL — AB (ref 4.0–10.5)

## 2014-09-09 LAB — GLUCOSE, CAPILLARY
GLUCOSE-CAPILLARY: 160 mg/dL — AB (ref 65–99)
GLUCOSE-CAPILLARY: 163 mg/dL — AB (ref 65–99)
GLUCOSE-CAPILLARY: 198 mg/dL — AB (ref 65–99)
GLUCOSE-CAPILLARY: 200 mg/dL — AB (ref 65–99)
Glucose-Capillary: 170 mg/dL — ABNORMAL HIGH (ref 65–99)
Glucose-Capillary: 156 mg/dL — ABNORMAL HIGH (ref 65–99)

## 2014-09-09 LAB — URINALYSIS, ROUTINE W REFLEX MICROSCOPIC
BILIRUBIN URINE: NEGATIVE
Glucose, UA: 100 mg/dL — AB
Hgb urine dipstick: NEGATIVE
KETONES UR: NEGATIVE mg/dL
LEUKOCYTES UA: NEGATIVE
NITRITE: NEGATIVE
PROTEIN: NEGATIVE mg/dL
Specific Gravity, Urine: 1.028 (ref 1.005–1.030)
UROBILINOGEN UA: 0.2 mg/dL (ref 0.0–1.0)
pH: 6 (ref 5.0–8.0)

## 2014-09-09 LAB — APTT: APTT: 38 s — AB (ref 24–37)

## 2014-09-09 MED ORDER — RIVAROXABAN 20 MG PO TABS
20.0000 mg | ORAL_TABLET | Freq: Every day | ORAL | Status: DC
  Administered 2014-09-09 – 2014-09-10 (×2): 20 mg via ORAL
  Filled 2014-09-09 (×3): qty 1

## 2014-09-09 NOTE — Progress Notes (Signed)
Patient ID: Cody Suarez, male   DOB: 01-17-1945, 69 y.o.   MRN: [id number] [facility] PROGRESS NOTE  DEANDRE BRANNAN QIW:[id number] DOB: [birth year] DOA: 09/06/2014 PCP: [place], BETTY G, MD   Brief narrative:    69 y.o. Male with known PVD and thrombus (has been on chronic Xarelto), HTN, Ulcerative colitis, colon cancer, presented to [facility] ED with main concern of sudden onset RUQ pain. Imaging studies concerning for acute cholecystitis, surgery consulted.   Assessment/Plan:   Acute cholecystitis - s/p lap chole, post op day #1 - pt clinically stable, wants to eat - advanced diet - continue zosyn for now, follow WBC  - reassess in AM  Hx of DVT - resume home regimen with Xarelto   Transaminitis - mild elevation in AST - will repeat CMET in AM  History of colon cancer  - status post colon resection. Stable supportive care.  PAD - No acute issues continue AC as noted above   Essential hypertension.  - Continue home dose Coreg along with as needed hydralazine.  ARF - IVF provided and Cr is now WNL - repeat BMP in AM  Hyponatremia - likely pre renal  - Na improving - repeat BMP in AM  DVT prophylaxis - on Xarelto   Code Status: Full.  Family Communication:  plan of care discussed with the patient and family at bedside  Disposition Plan: Home when stable.   IV access:  Peripheral IV  Procedures and diagnostic studies:    Dg Cholangiogram Operative 09/08/2014  Non-opacified distal common duct and no flow of contrast into the duodenum. No visible filling defects and no biliary ductal dilatation.     Nm Hepatobiliary Including Gb 09/07/2014  Not opacification of the gallbladder compatible with cystic duct obstruction. Findings are concerning for acute cholecystitis.    Ct Abdomen Pelvis W Contrast 09/06/2014  Mild wall thickening and stranding about the gallbladder, raising question for mild acute cholecystitis. 2. Nonobstructing right renal stones measure up to 5 mm  in size. 3. Mild bibasilar atelectasis noted. 4. Scattered calcification along the abdominal aorta and its branches.     US Abdomen Limited 09/06/2014 Gallbladder distention, wall thickness at upper limits of normal, and dependent sludge, which may be seen with acute cholecystitis.  Hepatic increased echogenicity likely indicates underlying steatosis without focal abnormality or intrahepatic ductal dilatation.     Medical Consultants:  Surgery  Cardiology   Other Consultants:  None  IAnti-Infectives:   Anne Ng, MD  [facility] Pager [phone]  If 7PM-7AM, please contact night-coverage [email or web address] Password [email or web address] 09/09/2014, 1:42 PM   LOS: 3 days   HPI/Subjective: No events overnight.   Objective: Filed Vitals:   09/08/14 1908 09/08/14 2118 09/09/14 0217 09/09/14 0607  BP: 160/78 140/80 141/72 139/67  Pulse: 98 96 89 76  Temp: 98.7 F (37.1 C) 98.1 F (36.7 C) 98.1 F (36.7 C) 98 F (36.7 C)  TempSrc:  Oral Oral Oral  Resp: 18 18 18 18   Height:      Weight:      SpO2: 93% 90% 94% 94%    Intake/Output Summary (Last 24 hours) at 09/09/14 1342 Last data filed at 09/08/14 1856  Gross per 24 hour  Intake    700 ml  Output    175 ml  Net    525 ml    Exam:   General:  Pt is alert, follows commands appropriately, not in acute distress  Cardiovascular: Regular rate and rhythm, S1/S2, no  murmurs, no rubs, no gallops  Respiratory: Clear to auscultation bilaterally, no wheezing, no crackles, no rhonchi  Abdomen: Soft, non tender, non distended, bowel sounds present, no guarding  Data Reviewed: Basic Metabolic Panel:  Recent Labs Lab 09/06/14 1700 09/07/14 0530 09/08/14 0556 09/09/14 0538  NA 135 136 132* 134*  K 3.9 3.9 3.7 4.1  CL 99* 100* 102 103  CO2 25 26 25 22   GLUCOSE 134* 127* 115* 170*  BUN 19 13 13 12   CREATININE 1.17 0.95 1.29* 1.08  CALCIUM 9.7 9.2 8.3* 8.3*   Liver Function Tests:  Recent Labs Lab 09/06/14 1700  09/07/14 0530 09/08/14 0556 09/09/14 0538  AST 23 23 17  59*  ALT 24 23 19  63  ALKPHOS 35* 34* 30* 45  BILITOT 0.8 1.1 0.9 0.6  PROT 7.1 6.7 6.4* 6.4*  ALBUMIN 3.8 3.7 3.1* 3.1*    Recent Labs Lab 09/06/14 1700  LIPASE 15*   No results for input(s): AMMONIA in the last 168 hours. CBC:  Recent Labs Lab 09/06/14 1700 09/07/14 0530 09/08/14 0556 09/09/14 0538  WBC 14.1* 13.4* 12.2* 12.3*  NEUTROABS 10.8*  --   --   --   HGB 14.2 14.4 12.7* 12.5*  HCT 41.8 43.0 38.4* 37.9*  MCV 92.9 94.7 95.3 95.5  PLT 304 303 262 278   Cardiac Enzymes:  Recent Labs Lab 09/06/14 1700  TROPONINI <0.03   BNP: Invalid input(s): POCBNP CBG:  Recent Labs Lab 09/08/14 2022 09/09/14 09/09/14 0409 09/09/14 0734 09/09/14 1205  GLUCAP 134* 160* 170* 156* 200*    Recent Results (from the past 240 hour(s))  Surgical pcr screen     Status: None   Collection Time: 09/07/14  6:06 PM  Result Value Ref Range Status   MRSA, PCR NEGATIVE NEGATIVE Final   Staphylococcus aureus NEGATIVE NEGATIVE Final    Comment:        The Xpert SA Assay (FDA approved for NASAL specimens in patients over 26 years of age), is one component of a comprehensive surveillance program.  Test performance has been validated by [facility] for patients greater than or equal to 39 year old. It is not intended to diagnose infection nor to guide or monitor treatment.      Scheduled Meds: . budesonide  9 mg Oral Daily  . carvedilol  6.25 mg Oral BID  . docusate sodium  100 mg Oral BID  . folic acid  1 mg Oral Daily  . insulin aspart  0-15 Units Subcutaneous 6 times per day  . multivitamin with minerals  1 tablet Oral Daily  . piperacillin-tazobactam (ZOSYN)  IV  3.375 g Intravenous 3 times per day  . rivaroxaban  20 mg Oral Q breakfast  . thiamine  100 mg Oral Daily   Continuous Infusions: . sodium chloride 100 mL/hr at 09/08/14 1404  . dextrose 5 % and 0.45 % NaCl with KCl 20 mEq/L 50 mL/hr at  09/08/14 2012  . lactated ringers    . lactated ringers

## 2014-09-09 NOTE — Progress Notes (Signed)
Patient ID: Cody Suarez, male   DOB: October 09, 1945, 69 y.o.   MRN: [id number]  [facility]  POD#: 1  Subjective: Patient in bed, comfortable.  Wants to eat regular diet.  Loose BM's.  Objective: Vital signs in last 24 hours: Temp:  [97.6 F (36.4 C)-99.1 F (37.3 C)] 98 F (36.7 C) (09/04 0607) Pulse Rate:  [66-98] 76 (09/04 0607) Resp:  [14-19] 18 (09/04 0607) BP: (136-162)/(66-80) 139/67 mmHg (09/04 0607) SpO2:  [90 %-97 %] 94 % (09/04 0607) Last BM Date: 09/06/14  Intake/Output from previous day: 09/03 0701 - 09/04 0700 In: 700 [I.V.:700] Out: 175 [Urine:175] Intake/Output this shift:    Physical Exam: HEENT - sclerae clear, mucous membranes moist Neck - soft Abdomen - protuberant, soft, mild tenderness and surgical sites; BS present Ext - no edema, non-tender Neuro - alert & oriented, no focal deficits  Lab Results:   Recent Labs  09/08/14 0556 09/09/14 0538  WBC 12.2* 12.3*  HGB 12.7* 12.5*  HCT 38.4* 37.9*  PLT 262 278   BMET  Recent Labs  09/08/14 0556 09/09/14 0538  NA 132* 134*  K 3.7 4.1  CL 102 103  CO2 25 22  GLUCOSE 115* 170*  BUN 13 12  CREATININE 1.29* 1.08  CALCIUM 8.3* 8.3*   PT/INR  Recent Labs  09/07/14 0530  LABPROT 16.0*  INR 1.27   Comprehensive Metabolic Panel:    Component Value Date/Time   NA 134* 09/09/2014 0538   NA 132* 09/08/2014 0556   NA 139 07/18/2014 1302   NA 140 11/24/2013 0959   K 4.1 09/09/2014 0538   K 3.7 09/08/2014 0556   K 4.4 07/18/2014 1302   K 3.5 11/24/2013 0959   CL 103 09/09/2014 0538   CL 102 09/08/2014 0556   CO2 22 09/09/2014 0538   CO2 25 09/08/2014 0556   CO2 25 07/18/2014 1302   CO2 23 11/24/2013 0959   BUN 12 09/09/2014 0538   BUN 13 09/08/2014 0556   BUN 17.3 07/18/2014 1302   BUN 11.4 11/24/2013 0959   CREATININE 1.08 09/09/2014 0538   CREATININE 1.29* 09/08/2014 0556   CREATININE 1.3 07/18/2014 1302   CREATININE 0.9 11/24/2013 0959    GLUCOSE 170* 09/09/2014 0538   GLUCOSE 115* 09/08/2014 0556   GLUCOSE 105 07/18/2014 1302   GLUCOSE 117 11/24/2013 0959   CALCIUM 8.3* 09/09/2014 0538   CALCIUM 8.3* 09/08/2014 0556   CALCIUM 9.8 07/18/2014 1302   CALCIUM 8.9 11/24/2013 0959   AST 59* 09/09/2014 0538   AST 17 09/08/2014 0556   AST 18 07/18/2014 1302   AST 13 11/24/2013 0959   ALT 63 09/09/2014 0538   ALT 19 09/08/2014 0556   ALT 22 07/18/2014 1302   ALT 18 11/24/2013 0959   ALKPHOS 45 09/09/2014 0538   ALKPHOS 30* 09/08/2014 0556   ALKPHOS 45 07/18/2014 1302   ALKPHOS 74 11/24/2013 0959   BILITOT 0.6 09/09/2014 0538   BILITOT 0.9 09/08/2014 0556   BILITOT 0.47 07/18/2014 1302   BILITOT 0.48 11/24/2013 0959   PROT 6.4* 09/09/2014 0538   PROT 6.4* 09/08/2014 0556   PROT 7.0 07/18/2014 1302   PROT 6.7 11/24/2013 0959   ALBUMIN 3.1* 09/09/2014 0538   ALBUMIN 3.1* 09/08/2014 0556   ALBUMIN 3.7 07/18/2014 1302   ALBUMIN 3.3* 11/24/2013 0959    Studies/Results: Dg Cholangiogram Operative  09/08/2014   CLINICAL DATA:  Cholecystectomy for cholecystitis.  EXAM: INTRAOPERATIVE CHOLANGIOGRAM  TECHNIQUE:  Cholangiographic images from the C-arm fluoroscopic device were submitted for interpretation post-operatively. Please see the procedural report for the amount of contrast and the fluoroscopy time utilized.  COMPARISON:  Recent nuclear medicine, ultrasound and CT examinations.  FINDINGS: Multiple C-arm views of the right upper quadrant of the abdomen demonstrate contrast injection through the cystic duct remnant. The common duct and intrahepatic ducts are normally opacified and normal in caliber with no filling defects seen. The distal common duct and duodenum are not opacified on any of the images.  IMPRESSION: Non-opacified distal common duct and no flow of contrast into the duodenum. No visible filling defects and no biliary ductal dilatation.   Electronically Signed   By: Claudie Revering M.D.   On: 09/08/2014 19:06   Nm  Hepatobiliary Including Gb  09/07/2014   CLINICAL DATA:  Evaluate for acute cholecystitis.  EXAM: NUCLEAR MEDICINE HEPATOBILIARY IMAGING  TECHNIQUE: Sequential images of the abdomen were obtained out to 60 minutes following intravenous administration of radiopharmaceutical.  RADIOPHARMACEUTICALS:  5.04 mCi Tc-60m Choletec IV  COMPARISON:  Ultrasound 09/06/2014; CT 09/06/2014  FINDINGS: Normal uptake of radiotracer within the liver. Radiotracer is excreted into biliary system and small bowel. There is non opacification of the gallbladder consistent with cystic duct obstruction.  IMPRESSION: Not opacification of the gallbladder compatible with cystic duct obstruction. Findings are concerning for acute cholecystitis.  These results will be called to the ordering clinician or representative by the Radiologist Assistant, and communication documented in the PACS or zVision Dashboard.   Electronically Signed   By: DLovey NewcomerM.D.   On: 09/07/2014 14:49    Anti-infectives: Anti-infectives    Start     Dose/Rate Route Frequency Ordered Stop   09/08/14 2000  piperacillin-tazobactam (ZOSYN) IVPB 3.375 g  Status:  Discontinued     3.375 g 12.5 mL/hr over 240 Minutes Intravenous 3 times per day 09/08/14 1947 09/08/14 1952   09/07/14 0600  piperacillin-tazobactam (ZOSYN) IVPB 3.375 g     3.375 g 12.5 mL/hr over 240 Minutes Intravenous 3 times per day 09/07/14 0052     09/06/14 2015  piperacillin-tazobactam (ZOSYN) IVPB 3.375 g     3.375 g 12.5 mL/hr over 240 Minutes Intravenous  Once 09/06/14 2014 09/06/14 2055      Assessment & Plans: Acute cholecystitis  IV Zosyn today  Regular diet as tolerated  OOB, ambulate  Heparin drip, Xarelto per pharmacy  Likely home 1-2 days if doing well  TEarnstine Regal MD, [facility] Office: [address]9/04/2014

## 2014-09-09 NOTE — Progress Notes (Signed)
ANTICOAGULATION CONSULT NOTE - Follow Up Consult  Pharmacy Consult for Heparin --> Xarelto Indication: Hx of DVT/PVD   Assessment: 62 yoM admitted 9/1 with acute cholecystitis.  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  Xarelto placed on hold (last dose taken 9/1 at 0630) for surgery and patient placed on heparin bridge.  Patient now laparoscopic cholecystectomy with intraoperative cholangiogram 9/3.  Heparin infusion resumed this morning on POD#1.  Now pharmacy consulted by surgery to resume Xarelto today.    Hgb 12.5, platelets WNL CrCl~66 ml/min  Plan:  1.  Stop heparin infusion. 2.  Resume Xarelto 20 mg daily.  Administer first dose today at time of heparin discontinuation.  Hershal Coria, PharmD, BCPS Pager: [phone] 09/09/2014 10:06 AM

## 2014-09-10 LAB — COMPREHENSIVE METABOLIC PANEL
ALBUMIN: 2.9 g/dL — AB (ref 3.5–5.0)
ALT: 50 U/L (ref 17–63)
AST: 34 U/L (ref 15–41)
Alkaline Phosphatase: 40 U/L (ref 38–126)
Anion gap: 8 (ref 5–15)
BILIRUBIN TOTAL: 0.4 mg/dL (ref 0.3–1.2)
BUN: 14 mg/dL (ref 6–20)
CO2: 23 mmol/L (ref 22–32)
CREATININE: 1.02 mg/dL (ref 0.61–1.24)
Calcium: 8.2 mg/dL — ABNORMAL LOW (ref 8.9–10.3)
Chloride: 106 mmol/L (ref 101–111)
GFR calc Af Amer: >60 mL/min (ref >60)
GLUCOSE: 132 mg/dL — AB (ref 65–99)
Potassium: 3.9 mmol/L (ref 3.5–5.1)
Sodium: 137 mmol/L (ref 135–145)
TOTAL PROTEIN: 6.4 g/dL — AB (ref 6.5–8.1)

## 2014-09-10 LAB — CBC
HEMATOCRIT: 36.9 % — AB (ref 39.0–52.0)
HEMOGLOBIN: 12.2 g/dL — AB (ref 13.0–17.0)
MCH: 31.4 pg (ref 26.0–34.0)
MCHC: 33.1 g/dL (ref 30.0–36.0)
MCV: 94.9 fL (ref 78.0–100.0)
Platelets: 301 10*3/uL (ref 150–400)
RBC: 3.89 MIL/uL — AB (ref 4.22–5.81)
RDW: 14.2 % (ref 11.5–15.5)
WBC: 14.5 10*3/uL — AB (ref 4.0–10.5)

## 2014-09-10 LAB — GLUCOSE, CAPILLARY
GLUCOSE-CAPILLARY: 144 mg/dL — AB (ref 65–99)
Glucose-Capillary: 129 mg/dL — ABNORMAL HIGH (ref 65–99)

## 2014-09-10 LAB — OSMOLALITY, URINE: Osmolality, Ur: 681 mosm/kg (ref 390–1090)

## 2014-09-10 LAB — SODIUM, URINE, RANDOM: Sodium, Ur: 67 mmol/L

## 2014-09-10 LAB — CREATININE, URINE, RANDOM: Creatinine, Urine: 166.65 mg/dL

## 2014-09-10 MED ORDER — CIPROFLOXACIN HCL 500 MG PO TABS: 500.0000 mg | ORAL_TABLET | Freq: Two times a day (BID) | ORAL | Status: DC

## 2014-09-10 NOTE — Progress Notes (Signed)
Pt left at this time with his family. Alert, oriented, and without c/o. Discharge instructions/prescription given/explained with pt and family verbalizing understanding. Followup appointments noted.

## 2014-09-10 NOTE — Progress Notes (Signed)
Patient ID: Cody Suarez, male   DOB: 1945/09/10, 69 y.o.   MRN: [id number]  [facility]  POD#: 2  Subjective: Patient doing well this AM, wants to go home.  Family at bedside.  BM this AM.  No complaints.  Objective: Vital signs in last 24 hours: Temp:  [97.1 F (36.2 C)-98.2 F (36.8 C)] 97.7 F (36.5 C) (09/05 0505) Pulse Rate:  [65-69] 65 (09/05 0505) Resp:  [18] 18 (09/05 0505) BP: (127-135)/(62-71) 131/71 mmHg (09/05 0505) SpO2:  [94 %-97 %] 96 % (09/05 0505) Last BM Date: 09/06/14  Intake/Output from previous day: 09/04 0701 - 09/05 0700 In: 600 [I.V.:600] Out: 300 [Urine:300] Intake/Output this shift:    Physical Exam: HEENT - sclerae clear, mucous membranes moist Abdomen - soft, dressings dry and intact Ext - no edema, non-tender Neuro - alert & oriented, no focal deficits  Lab Results:   Recent Labs  09/09/14 0538 09/10/14 0525  WBC 12.3* 14.5*  HGB 12.5* 12.2*  HCT 37.9* 36.9*  PLT 278 301   BMET  Recent Labs  09/09/14 0538 09/10/14 0525  NA 134* 137  K 4.1 3.9  CL 103 106  CO2 22 23  GLUCOSE 170* 132*  BUN 12 14  CREATININE 1.08 1.02  CALCIUM 8.3* 8.2*   PT/INR No results for input(s): LABPROT, INR in the last 72 hours. Comprehensive Metabolic Panel:    Component Value Date/Time   NA 137 09/10/2014 0525   NA 134* 09/09/2014 0538   NA 139 07/18/2014 1302   NA 140 11/24/2013 0959   K 3.9 09/10/2014 0525   K 4.1 09/09/2014 0538   K 4.4 07/18/2014 1302   K 3.5 11/24/2013 0959   CL 106 09/10/2014 0525   CL 103 09/09/2014 0538   CO2 23 09/10/2014 0525   CO2 22 09/09/2014 0538   CO2 25 07/18/2014 1302   CO2 23 11/24/2013 0959   BUN 14 09/10/2014 0525   BUN 12 09/09/2014 0538   BUN 17.3 07/18/2014 1302   BUN 11.4 11/24/2013 0959   CREATININE 1.02 09/10/2014 0525   CREATININE 1.08 09/09/2014 0538   CREATININE 1.3 07/18/2014 1302   CREATININE 0.9 11/24/2013 0959   GLUCOSE 132* 09/10/2014  0525   GLUCOSE 170* 09/09/2014 0538   GLUCOSE 105 07/18/2014 1302   GLUCOSE 117 11/24/2013 0959   CALCIUM 8.2* 09/10/2014 0525   CALCIUM 8.3* 09/09/2014 0538   CALCIUM 9.8 07/18/2014 1302   CALCIUM 8.9 11/24/2013 0959   AST 34 09/10/2014 0525   AST 59* 09/09/2014 0538   AST 18 07/18/2014 1302   AST 13 11/24/2013 0959   ALT 50 09/10/2014 0525   ALT 63 09/09/2014 0538   ALT 22 07/18/2014 1302   ALT 18 11/24/2013 0959   ALKPHOS 40 09/10/2014 0525   ALKPHOS 45 09/09/2014 0538   ALKPHOS 45 07/18/2014 1302   ALKPHOS 74 11/24/2013 0959   BILITOT 0.4 09/10/2014 0525   BILITOT 0.6 09/09/2014 0538   BILITOT 0.47 07/18/2014 1302   BILITOT 0.48 11/24/2013 0959   PROT 6.4* 09/10/2014 0525   PROT 6.4* 09/09/2014 0538   PROT 7.0 07/18/2014 1302   PROT 6.7 11/24/2013 0959   ALBUMIN 2.9* 09/10/2014 0525   ALBUMIN 3.1* 09/09/2014 0538   ALBUMIN 3.7 07/18/2014 1302   ALBUMIN 3.3* 11/24/2013 0959    Studies/Results: Dg Cholangiogram Operative  09/08/2014   CLINICAL DATA:  Cholecystectomy for cholecystitis.  EXAM: INTRAOPERATIVE CHOLANGIOGRAM  TECHNIQUE: Cholangiographic images from  the C-arm fluoroscopic device were submitted for interpretation post-operatively. Please see the procedural report for the amount of contrast and the fluoroscopy time utilized.  COMPARISON:  Recent nuclear medicine, ultrasound and CT examinations.  FINDINGS: Multiple C-arm views of the right upper quadrant of the abdomen demonstrate contrast injection through the cystic duct remnant. The common duct and intrahepatic ducts are normally opacified and normal in caliber with no filling defects seen. The distal common duct and duodenum are not opacified on any of the images.  IMPRESSION: Non-opacified distal common duct and no flow of contrast into the duodenum. No visible filling defects and no biliary ductal dilatation.   Electronically Signed   By: Claudie Revering M.D.   On: 09/08/2014 19:06     Anti-infectives: Anti-infectives    Start     Dose/Rate Route Frequency Ordered Stop   09/10/14 0000  ciprofloxacin (CIPRO) 500 MG tablet     500 mg Oral 2 times daily 09/10/14 1049     09/08/14 2000  piperacillin-tazobactam (ZOSYN) IVPB 3.375 g  Status:  Discontinued     3.375 g 12.5 mL/hr over 240 Minutes Intravenous 3 times per day 09/08/14 1947 09/08/14 1952   09/07/14 0600  piperacillin-tazobactam (ZOSYN) IVPB 3.375 g     3.375 g 12.5 mL/hr over 240 Minutes Intravenous 3 times per day 09/07/14 0052     09/06/14 2015  piperacillin-tazobactam (ZOSYN) IVPB 3.375 g     3.375 g 12.5 mL/hr over 240 Minutes Intravenous  Once 09/06/14 2014 09/06/14 2055      Assessment & Plans: Status post lap cholecystectomy with IOC for acute cholecystitis  OK to discharge  Continue oral abx for 5 days more  Follow up at [place] office to be arranged  Earnstine Regal, MD, [facility] Office: [place] 09/10/2014

## 2014-09-10 NOTE — Discharge Summary (Signed)
Physician Discharge Summary  Cody Suarez FRT:[id number] DOB: February 15, 1945 DOA: 09/06/2014  PCP: [place], BETTY G, MD  Admit date: 09/06/2014 Discharge date: 09/10/2014  Recommendations for Outpatient Follow-up:  1. Pt will need to follow up with PCP in 2-3 weeks post discharge 2. Please obtain CMP to evaluate electrolytes and liver tests  3. Please also check CBC to evaluate WBC  Discharge Diagnoses:  Principal Problem:   Cholecystitis Active Problems:   Hypertension   Anemia of chronic disease   Hyperglycemia   Acute cholecystitis   Abdominal pain   Preoperative clearance    Discharge Condition: Stable  Diet recommendation: Heart healthy diet discussed in details   Brief narrative:    69 y.o. Male with known PVD and thrombus (has been on chronic Xarelto), HTN, Ulcerative colitis, colon cancer, presented to [facility] ED with main concern of sudden onset RUQ pain. Imaging studies concerning for acute cholecystitis, surgery consulted.   Assessment/Plan:   Acute cholecystitis - s/p lap chole, post op day #2, wants to go homw  - pt clinically stable, wants to eat - advanced diet and pt tolerating wel  - complete ABX treatment with Ciprofloxacin   Hx of DVT - resumed home regimen with Xarelto   Transaminitis - mild elevation in AST, resolved   History of colon cancer  - status post colon resection. Stable supportive care.  PAD - No acute issues continue AC as noted above   Essential hypertension.  - Continue home dose Coreg along with as needed hydralazine.  ARF - IVF provided and Cr is now WNL  Hyponatremia - likely pre renal  - Na improving  DVT prophylaxis - on Xarelto   Code Status: Full.  Family Communication: plan of care discussed with the patient and family at bedside  Disposition Plan: Home  IV access:  Peripheral IV  Procedures and diagnostic studies:   Dg Cholangiogram Operative 09/08/2014 Non-opacified distal common duct and no flow  of contrast into the duodenum. No visible filling defects and no biliary ductal dilatation.   Nm Hepatobiliary Including Gb 09/07/2014 Not opacification of the gallbladder compatible with cystic duct obstruction. Findings are concerning for acute cholecystitis.   Ct Abdomen Pelvis W Contrast 09/06/2014 Mild wall thickening and stranding about the gallbladder, raising question for mild acute cholecystitis. 2. Nonobstructing right renal stones measure up to 5 mm in size. 3. Mild bibasilar atelectasis noted. 4. Scattered calcification along the abdominal aorta and its branches.   US Abdomen Limited 09/06/2014 Gallbladder distention, wall thickness at upper limits of normal, and dependent sludge, which may be seen with acute cholecystitis. Hepatic increased echogenicity likely indicates underlying steatosis without focal abnormality or intrahepatic ductal dilatation.   Medical Consultants:  Surgery  Cardiology   Other Consultants:  None  IAnti-Infectives:   Zosyn transitioned to Cipro for 5 ore days   Faye Ramsay, MD [facility] Pager [phone]          Discharge Exam: Filed Vitals:   09/10/14 0505  BP: 131/71  Pulse: 65  Temp: 97.7 F (36.5 C)  Resp: 18   Filed Vitals:   09/09/14 0607 09/09/14 1354 09/09/14 2142 09/10/14 0505  BP: 139/67 127/62 135/67 131/71  Pulse: 76 68 69 65  Temp: 98 F (36.7 C) 97.1 F (36.2 C) 98.2 F (36.8 C) 97.7 F (36.5 C)  TempSrc: Oral Oral Oral Oral  Resp: 18 18 18 18   Height:      Weight:      SpO2: 94% 97% 94% 96%  General: Pt is alert, follows commands appropriately, not in acute distress Cardiovascular: Regular rate and rhythm, S1/S2 +, no murmurs, no rubs, no gallops Respiratory: Clear to auscultation bilaterally, no wheezing, no crackles, no rhonchi Abdominal: Soft, non tender, non distended, bowel sounds +, no guarding Extremities: no edema, no cyanosis, pulses palpable bilaterally DP and  PT Neuro: Grossly nonfocal  Discharge Instructions  Discharge Instructions    Diet - low sodium heart healthy    Complete by:  As directed      Increase activity slowly    Complete by:  As directed             Medication List    TAKE these medications        acetaminophen 500 MG tablet  Commonly known as:  TYLENOL  Take 1,000 mg by mouth 3 (three) times daily as needed (pain).     carvedilol 6.25 MG tablet  Commonly known as:  COREG  Take 1 tablet (6.25 mg total) by mouth 2 (two) times daily.     ciprofloxacin 500 MG tablet  Commonly known as:  CIPRO  Take 1 tablet (500 mg total) by mouth 2 (two) times daily.     diphenhydramine-acetaminophen 25-500 MG Tabs  Commonly known as:  TYLENOL PM  Take 1 tablet by mouth at bedtime as needed.     HYDROcodone-acetaminophen 5-325 MG per tablet  Commonly known as:  NORCO/VICODIN  Take 1 tablet by mouth every 6 (six) hours as needed for moderate pain (Takes one tablet by mouth as needed for moderate pain).     losartan 100 MG tablet  Commonly known as:  COZAAR  Take 1 tablet (100 mg total) by mouth daily.     ondansetron 4 MG disintegrating tablet  Commonly known as:  ZOFRAN ODT  Take 1 tablet (4 mg total) by mouth every 8 (eight) hours as needed for nausea or vomiting.     OVER THE COUNTER MEDICATION     rivaroxaban 20 MG Tabs tablet  Commonly known as:  XARELTO  Take 1 tablet (20 mg total) by mouth daily with breakfast.     spironolactone 25 MG tablet  Commonly known as:  ALDACTONE  Take 1 tablet (25 mg total) by mouth daily.     UCERIS 9 MG Tb24  Generic drug:  Budesonide  Take 9 mg by mouth daily.            Follow-up Information    Follow up with Martinique, BETTY G, MD.   Specialty:  Family Medicine   Contact information:   [place] [address] [phone]       Call Faye Ramsay, MD.   Specialty:  Internal Medicine   Why:  As needed call my cell [phone]   Contact  information:   [address] [place] [address] [phone]        The results of significant diagnostics from this hospitalization (including imaging, microbiology, ancillary and laboratory) are listed below for reference.     Microbiology: Recent Results (from the past 240 hour(s))  Surgical pcr screen     Status: None   Collection Time: 09/07/14  6:06 PM  Result Value Ref Range Status   MRSA, PCR NEGATIVE NEGATIVE Final   Staphylococcus aureus NEGATIVE NEGATIVE Final    Comment:        The Xpert SA Assay (FDA approved for NASAL specimens in patients over 34 years of age), is one  component of a comprehensive surveillance program.  Test performance has been validated by [facility] for patients greater than or equal to 43 year old. It is not intended to diagnose infection nor to guide or monitor treatment.      Labs: Basic Metabolic Panel:  Recent Labs Lab 09/06/14 1700 09/07/14 0530 09/08/14 0556 09/09/14 0538 09/10/14 0525  NA 135 136 132* 134* 137  K 3.9 3.9 3.7 4.1 3.9  CL 99* 100* 102 103 106  CO2 25 26 25 22 23   GLUCOSE 134* 127* 115* 170* 132*  BUN 19 13 13 12 14   CREATININE 1.17 0.95 1.29* 1.08 1.02  CALCIUM 9.7 9.2 8.3* 8.3* 8.2*   Liver Function Tests:  Recent Labs Lab 09/06/14 1700 09/07/14 0530 09/08/14 0556 09/09/14 0538 09/10/14 0525  AST 23 23 17  59* 34  ALT 24 23 19  63 50  ALKPHOS 35* 34* 30* 45 40  BILITOT 0.8 1.1 0.9 0.6 0.4  PROT 7.1 6.7 6.4* 6.4* 6.4*  ALBUMIN 3.8 3.7 3.1* 3.1* 2.9*    Recent Labs Lab 09/06/14 1700  LIPASE 15*   No results for input(s): AMMONIA in the last 168 hours. CBC:  Recent Labs Lab 09/06/14 1700 09/07/14 0530 09/08/14 0556 09/09/14 0538 09/10/14 0525  WBC 14.1* 13.4* 12.2* 12.3* 14.5*  NEUTROABS 10.8*  --   --   --   --   HGB 14.2 14.4 12.7* 12.5* 12.2*  HCT 41.8 43.0 38.4* 37.9* 36.9*  MCV 92.9 94.7 95.3 95.5 94.9  PLT 304 303 262 278 301   Cardiac  Enzymes:  Recent Labs Lab 09/06/14 1700  TROPONINI <0.03   BNP: BNP (last 3 results) No results for input(s): BNP in the last 8760 hours.  ProBNP (last 3 results) No results for input(s): PROBNP in the last 8760 hours.  CBG:  Recent Labs Lab 09/09/14 1205 09/09/14 1721 09/09/14 2016 09/10/14 0155 09/10/14 0503  GLUCAP 200* 198* 163* 144* 129*     SIGNED: Time coordinating discharge: 30 minutes  MAGICK-MYERS, ISKRA, MD  [facility] 09/10/2014, 10:50 AM Pager [phone]  If 7PM-7AM, please contact night-coverage [email or web address] Password [email or web address]

## 2014-09-10 NOTE — Discharge Instructions (Signed)
Cholecystitis Cholecystitis is an inflammation of your gallbladder. It is usually caused by a buildup of gallstones or sludge (cholelithiasis) in your gallbladder. The gallbladder stores a fluid that helps digest fats (bile). Cholecystitis is serious and needs treatment right away.  CAUSES   Gallstones. Gallstones can block the tube that leads to your gallbladder, causing bile to build up. As bile builds up, the gallbladder becomes inflamed.  Bile duct problems, such as blockage from scarring or kinking.  Tumors. Tumors can stop bile from leaving your gallbladder correctly, causing bile to build up. As bile builds up, the gallbladder becomes inflamed. SYMPTOMS   Nausea.  Vomiting.  Abdominal pain, especially in the upper right area of your abdomen.  Abdominal tenderness or bloating.  Sweating.  Chills.  Fever.  Yellowing of the skin and the whites of the eyes (jaundice). DIAGNOSIS  Your caregiver may order blood tests to look for infection or gallbladder problems. Your caregiver may also order imaging tests, such as an ultrasound or computed tomography (CT) scan. Further tests may include a hepatobiliary iminodiacetic acid (HIDA) scan. This scan allows your caregiver to see your bile move from the liver to the gallbladder and to the small intestine. TREATMENT  A hospital stay is usually necessary to lessen the inflammation of your gallbladder. You may be required to not eat or drink (fast) for a certain amount of time. You may be given medicine to treat pain or an antibiotic medicine to treat an infection. Surgery may be needed to remove your gallbladder (cholecystectomy) once the inflammation has gone down. Surgery may be needed right away if you develop complications such as death of gallbladder tissue (gangrene) or a tear (perforation) of the gallbladder.  [place] care will depend on your treatment. In general:  If you were given antibiotics, take them as  directed. Finish them even if you start to feel better.  Only take over-the-counter or prescription medicines for pain, discomfort, or fever as directed by your caregiver.  Follow a low-fat diet until you see your caregiver again.  Keep all follow-up visits as directed by your caregiver. SEEK IMMEDIATE MEDICAL CARE IF:   Your pain is increasing and not controlled by medicines.  Your pain moves to another part of your abdomen or to your back.  You have a fever.  You have nausea and vomiting. MAKE SURE YOU:  Understand these instructions.  Will watch your condition.  Will get help right away if you are not doing well or get worse. Document Released: 12/22/2004 Document Revised: 03/16/2011 Document Reviewed: 11/07/2010 [facility] Patient Information 2015 [place], [place]. This information is not intended to replace advice given to you by your health care provider. Make sure you discuss any questions you have with your health care provider.     [facility]  LAPAROSCOPIC SURGERY:  POST-OP INSTRUCTIONS  Always review your discharge instruction sheet given to you by the facility where your surgery was performed.  A prescription for pain medication may be given to you upon discharge.  Take your pain medication as prescribed.  If narcotic pain medicine is not needed, then you may take acetaminophen (Tylenol) or ibuprofen (Advil) as needed.  Take your usually prescribed medications unless otherwise directed.  If you need a refill on your pain medication, please contact your pharmacy.  They will contact our office to request authorization. Prescriptions will not be filled after 5 P.M. or on weekends.  You should follow a light diet the first  few days after arrival home, such as soup and crackers or toast.  Be sure to include plenty of fluids daily.  Most patients will experience some swelling and bruising in the area of the incisions.  Ice packs will help.  Swelling  and bruising can take several days to resolve.   It is common to experience some constipation after surgery.  Increasing fluid intake and taking a stool softener (such as Colace) will usually help or prevent this problem from occurring.  A mild laxative (Milk of Magnesia or Miralax) should be taken according to package instructions if there has been no bowel movement after 48 hours.  You will have steri-strips and a gauze dressing over your incisions.  You may remove the gauze bandage on the second day after surgery, and you may shower at that time.  Leave your steri-strips (small skin tapes) in place directly over the incision.  These strips should remain on the skin for 5-7 days and then be removed.  You may get them wet in the shower and pat them dry.  Any sutures or staples will be removed at the office during your follow-up visit.  ACTIVITIES:  You may resume regular (light) daily activities beginning the next day - such as daily self-care, walking, climbing stairs - gradually increasing activities as tolerated.  You may have sexual intercourse when it is comfortable.  Refrain from any heavy lifting or straining until approved by your doctor.  You may drive when you are no longer taking prescription pain medication, you can comfortably wear a seatbelt, and you can safely maneuver your car and apply brakes.  You should see your doctor in the office for a follow-up appointment approximately 2-3 weeks after your surgery.  Make sure that you call for this appointment within a day or two after you arrive home to insure a convenient appointment time.  WHEN TO CALL YOUR DOCTOR: 1. Fever over 101.0 2. Inability to urinate 3. Continued bleeding from incision 4. Increased pain, redness, or drainage from the incision 5. Increasing abdominal pain  The clinic staff is available to answer your questions during regular business hours.  Please dont hesitate to call and ask to speak to one of the nurses  for clinical concerns.  If you have a medical emergency, go to the nearest emergency room or call 911.  A surgeon from [facility] is always on call for the hospital.  Earnstine Regal, MD, [facility] Office: [place] Free:  [place] [phone]  Website: [email or web address]

## 2014-09-10 NOTE — Progress Notes (Signed)
ANTIBIOTIC and ANTICOAGULATION CONSULT NOTE - FOLLOW UP  Pharmacy Consult for zosyn and xarelto Indication: Intra-abdominal infection; hx DVT/PVD  Allergies  Allergen Reactions  . Amlodipine Swelling    Right LE edema    Patient Measurements: Height: 5' 10"  (177.8 cm) Weight: 193 lb (87.544 kg) IBW/kg (Calculated) : 73  Vital Signs: Temp: 97.7 F (36.5 C) (09/05 0505) Temp Source: Oral (09/05 0505) BP: 131/71 mmHg (09/05 0505) Pulse Rate: 65 (09/05 0505) Intake/Output from previous day: 09/04 0701 - 09/05 0700 In: 600 [I.V.:600] Out: 300 [Urine:300] Intake/Output from this shift:    Labs:  Recent Labs  09/08/14 0556 09/09/14 0538 09/09/14 1638 09/10/14 0525  WBC 12.2* 12.3*  --  14.5*  HGB 12.7* 12.5*  --  12.2*  PLT 262 278  --  301  LABCREA  --   --  166.65  --   CREATININE 1.29* 1.08  --  1.02   Estimated Creatinine Clearance: 70.6 mL/min (by C-G formula based on Cr of 1.02). No results for input(s): VANCOTROUGH, VANCOPEAK, VANCORANDOM, GENTTROUGH, GENTPEAK, GENTRANDOM, TOBRATROUGH, TOBRAPEAK, TOBRARND, AMIKACINPEAK, AMIKACINTROU, AMIKACIN in the last 72 hours.   Microbiology: Recent Results (from the past 720 hour(s))  Surgical pcr screen     Status: None   Collection Time: 09/07/14  6:06 PM  Result Value Ref Range Status   MRSA, PCR NEGATIVE NEGATIVE Final   Staphylococcus aureus NEGATIVE NEGATIVE Final    Comment:        The Xpert SA Assay (FDA approved for NASAL specimens in patients over 81 years of age), is one component of a comprehensive surveillance program.  Test performance has been validated by [facility] for patients greater than or equal to 53 year old. It is not intended to diagnose infection nor to guide or monitor treatment.     Anti-infectives    Start     Dose/Rate Route Frequency Ordered Stop   09/08/14 2000  piperacillin-tazobactam (ZOSYN) IVPB 3.375 g  Status:  Discontinued     3.375 g 12.5 mL/hr over 240 Minutes  Intravenous 3 times per day 09/08/14 1947 09/08/14 1952   09/07/14 0600  piperacillin-tazobactam (ZOSYN) IVPB 3.375 g     3.375 g 12.5 mL/hr over 240 Minutes Intravenous 3 times per day 09/07/14 0052     09/06/14 2015  piperacillin-tazobactam (ZOSYN) IVPB 3.375 g     3.375 g 12.5 mL/hr over 240 Minutes Intravenous  Once 09/06/14 2014 09/06/14 2055      Assessment: Patient's a 69 y.o Cody Suarez on xarelto PTA for hx PVD/DVT who presented to [facility] on 9/1 with acute cholecystitis (confirmed with HIDA scan) -- s/p lap cholecystectomy on 9/3.  Zosyn was started on 9/2 for broad empiric coverage. Xarelto was held on admit and patient was bridged with heparin in anticipation for surgical procedure. Xarelto resumed back on 9/4.  Today, 09/10/2014: - afeb, wbc trending up - scr improved to 1.02 with CrCl ~ 70 ml/min CG  9/2 >> zosyn >>  9/4 ucx:  9/2 MRSA PCR: negative   Plan:  - continue zosyn 3.375 gm IV q8h (infuse over 4 hours) - continue home xarelto dose of 15m PO daily - with renal function improving and crcl>50, pharmacy will sign off note writing for zosyn and xarelto.  Will continue to follow patient peripherally and intervene if dose adjust is needed.  Cody Cody Suarez P 09/10/2014,10:49 AM

## 2014-09-11 ENCOUNTER — Encounter (HOSPITAL_COMMUNITY): Payer: Self-pay | Admitting: Surgery

## 2014-09-11 LAB — FOLATE RBC
FOLATE, HEMOLYSATE: 327.6 ng/mL
FOLATE, RBC: 784 ng/mL (ref >498)
HEMATOCRIT: 41.8 % (ref 37.5–51.0)

## 2014-09-12 LAB — URINE CULTURE: CULTURE: NO GROWTH

## 2014-09-13 ENCOUNTER — Other Ambulatory Visit: Payer: Self-pay | Admitting: Physician Assistant

## 2014-10-17 ENCOUNTER — Telehealth: Payer: Self-pay | Admitting: Cardiovascular Disease

## 2014-10-17 ENCOUNTER — Telehealth: Payer: Self-pay

## 2014-10-17 NOTE — Telephone Encounter (Signed)
I spoke with the pt and we referred him to [facility] back in July due to increased renal function. The pt just received a phone call to schedule appointment. In reviewing the pt's chart he has had recent labs and BUN and Creatinine have been okay.  The pt said with everything that he has been going through recently he prefers to not have to see another doctor unless it is absolutely necessary.  The pt has continued to monitor his BP and it has been averaging 120/60.  I made the pt aware that I will forward this information to Dr Fletcher Anon to make a determination whether he requires Nephrology evaluation.

## 2014-10-17 NOTE — Telephone Encounter (Signed)
Pt. called to request a refill on Hydrocodone/ Acetaminophen 5/325 mg. Reported that he "favors" his right leg, and has intermittent hip and back pain, as a result.  Stated "I just wanted to have something on hand in case I needed it."  Advised that it is not our practice to prescribe pain medication outside the post-operative period.  Advised he should notify his PCP for evaluation of the back and hip pain.  Also, offered to schedule an appt. With Dr. Kellie Simmering.  Pt. declined and stated there is no reason to come to see Dr. Kellie Simmering, since I know what is causing the pain.  Pt. verb. appreciation for the call back, stating "it was worth a try."

## 2014-10-17 NOTE — Telephone Encounter (Signed)
New Message:  Pt called in stating that he got a call from [facility] about a referral sent over by Dr. Fletcher Anon and he is unsure of the reason for him being referred to a kidney specialist. Please f/u with him  Thanks

## 2014-10-23 NOTE — Telephone Encounter (Signed)
No need for nephrology appointment now.

## 2014-10-23 NOTE — Telephone Encounter (Signed)
I spoke with the pt and made him aware that he does not need to see [facility] at this time. I contacted [place] and spoke with Erline Levine and made her aware that the pt no longer needs appointment.

## 2014-11-14 ENCOUNTER — Other Ambulatory Visit: Payer: Self-pay | Admitting: Cardiovascular Disease

## 2015-01-14 ENCOUNTER — Other Ambulatory Visit: Payer: Self-pay | Admitting: Cardiovascular Disease

## 2015-01-18 ENCOUNTER — Encounter: Payer: Self-pay | Admitting: Vascular Surgery

## 2015-01-21 ENCOUNTER — Telehealth: Payer: Self-pay | Admitting: Nurse Practitioner

## 2015-01-21 ENCOUNTER — Other Ambulatory Visit: Payer: 59

## 2015-01-21 ENCOUNTER — Ambulatory Visit (HOSPITAL_BASED_OUTPATIENT_CLINIC_OR_DEPARTMENT_OTHER): Payer: 59 | Admitting: Nurse Practitioner

## 2015-01-21 VITALS — BP 174/73 | HR 87 | Temp 97.8°F | Resp 18 | Ht 70.0 in | Wt 196.7 lb

## 2015-01-21 DIAGNOSIS — D509 Iron deficiency anemia, unspecified: Secondary | ICD-10-CM

## 2015-01-21 DIAGNOSIS — C184 Malignant neoplasm of transverse colon: Secondary | ICD-10-CM | POA: Diagnosis not present

## 2015-01-21 DIAGNOSIS — I10 Essential (primary) hypertension: Secondary | ICD-10-CM | POA: Diagnosis not present

## 2015-01-21 NOTE — Telephone Encounter (Signed)
per pof to sch pt appt-gave pt copy of avs-adv [facility] will call to sch trmt

## 2015-01-21 NOTE — Progress Notes (Signed)
  [place] OFFICE PROGRESS NOTE   Diagnosis:  Colon cancer  INTERVAL HISTORY:   Cody Suarez returns as scheduled. He feels well. No change in bowel habits. No bloody or black stools. No abdominal pain. He has a good appetite. He remains active.  Objective:  Vital signs in last 24 hours:  Blood pressure 174/73, pulse 87, temperature 97.8 F (36.6 C), temperature source Oral, resp. rate 18, height _0  (1.778 m), weight 196 lb 11.2 oz (89.223 kg), SpO2 99 %.    HEENT: No thrush or ulcers. Lymphatics: No palpable cervical, supraclavicular, axillary or inguinal lymph nodes. Resp: Lungs clear bilaterally. Cardio: Regular rate and rhythm. GI: Abdomen soft and nontender. No hepatomegaly. Vascular: Trace edema right lower leg.   Lab Results:  Lab Results  Component Value Date   WBC 14.5* 09/10/2014   HGB 12.2* 09/10/2014   HCT 36.9* 09/10/2014   MCV 94.9 09/10/2014   PLT 301 09/10/2014   NEUTROABS 10.8* 09/06/2014    Imaging:  No results found.  Medications: I have reviewed the patient's current medications.  Assessment/Plan: 1. 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 Negative for a BRAF mutation  Tumor invaded through the muscularis propria into pericolonic fatty tissue and involved the attached omentum.   Cycle 1 adjuvant Xeloda 05/28/2013.   Cycle 2 adjuvant Xeloda 06/18/2013.   Cycle 3 adjuvant Xeloda 07/09/2013.   Cycle 4 adjuvant Xeloda 07/30/2013.   Cycle 5 adjuvant Xeloda 08/20/2013.  Cycle 6 adjuvant Xeloda 09/11/2013.  cycle 7 adjuvant Xeloda 10/02/2013  Cycle 8 adjuvant Xeloda 10/23/2013  CTs of the chest, abdomen, and pelvis on 07/18/2014-negative for recurrent colon cancer   CTs abdomen/pelvis 09/06/2014 showed acute  cholecystitis. 2. Ulcerative colitis-extensive chronic active ulcerative colitis was noted on the colon resection specimen 03/28/2013.  Followed by Dr. Watt Climes 3. Hypertension.  4. History of microcytic anemia-likely iron deficiency, unable to tolerate oral iron. Improved. 5. Possible area of cecal wall thickening noted on abdominal CT 03/20/2013. 6. Family history of colon cancer (maternal grandfather died in his 1s with colon cancer). 7. Bilateral calf and low anterior leg pain. Bilateral lower extremity venous duplex negative for DVT 07/14/2013. Right ABI with moderate, borderline severe arterial insufficiency; left ABI suggestive of moderate arterial insufficiency. He was referred to vascular. 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 TEE showed thrombus in the descending aorta. He underwent right popliteal to peroneal artery bypass graft, thrombectomy anterior tibialis and attempted thrombectomy tibioperoneal trunk and posterior tibialis on 08/11/2013. Right calf pain 09/26/2013 with findings of occlusion of right popliteal to peroneal bypass status post thrombolysis. Now on anticoagulation with Xarelto.  recurrent pain and ulceration at the right foot, status post a popliteal to posterior tibial artery bypass using a cephalic vein graft on 03/55/9741 8. Status post laparoscopic cholecystectomy 09/08/2014   Disposition: Cody Suarez remains in clinical remission from colon cancer. We will follow-up on the CEA from today. He will return for a follow-up visit in 8 months with labs and surveillance CT scans 2-3 days prior. He will contact the office in the interim with any problems.  Plan reviewed with Dr. Benay Spice.    Cody Suarez ANP/GNP-BC   01/21/2015  3:34 PM

## 2015-01-22 ENCOUNTER — Ambulatory Visit (INDEPENDENT_AMBULATORY_CARE_PROVIDER_SITE_OTHER): Payer: 59 | Admitting: Vascular Surgery

## 2015-01-22 ENCOUNTER — Encounter: Payer: Self-pay | Admitting: Vascular Surgery

## 2015-01-22 ENCOUNTER — Ambulatory Visit (HOSPITAL_COMMUNITY)
Admission: RE | Admit: 2015-01-22 | Discharge: 2015-01-22 | Disposition: A | Discharge status: Home / Self Care | Payer: 59 | Source: Ambulatory Visit | Attending: Vascular Surgery | Admitting: Vascular Surgery

## 2015-01-22 VITALS — BP 153/78 | HR 81 | Temp 97.2°F | Resp 16 | Ht 70.0 in | Wt 197.0 lb

## 2015-01-22 DIAGNOSIS — Z48812 Encounter for surgical aftercare following surgery on the circulatory system: Secondary | ICD-10-CM | POA: Diagnosis not present

## 2015-01-22 DIAGNOSIS — I739 Peripheral vascular disease, unspecified: Secondary | ICD-10-CM | POA: Diagnosis not present

## 2015-01-22 DIAGNOSIS — I10 Essential (primary) hypertension: Secondary | ICD-10-CM | POA: Insufficient documentation

## 2015-01-22 DIAGNOSIS — R938 Abnormal findings on diagnostic imaging of other specified body structures: Secondary | ICD-10-CM | POA: Diagnosis not present

## 2015-01-22 LAB — CEA: CEA1: 2.7 ng/mL (ref 0.0–4.7)

## 2015-01-22 LAB — CEA (PARALLEL TESTING): CEA: 2.1 ng/mL (ref 0.0–5.0)

## 2015-01-22 NOTE — Progress Notes (Signed)
Subjective:     Patient ID: Cody Suarez, male   DOB: 10-18-1945, 70 y.o.   MRN: [id number]  HPI  This 70 year old male returns for continued follow-up regarding his lower extremity occlusive disease. He has a history of colon cancer which was treated surgically 2 years ago and he is followed by Dr. Leroy Sea sherrill in the cancer clinic and is currently free of disease. He had 2 failed attempts at revascularization of the right leg due to his small diseased tibial vessels but fortunately he has maintained limb salvage. He denies any rest pain or nonhealing ulcers. There is no numbness in the feet. He does have claudication in both calves right worse than left if he ambulates quickly. He denies neurologic symptoms such as lateralizing weakness, aphasia, amaurosis fugax, diplopia, blurred vision, or syncope.  He continues onxeralto.  Past Medical History  Diagnosis Date  . Diarrhea   . Hypertension   . Ulcerative colitis ([place])     history  . Anemia of chronic disease 2015    "related to cancer tx" (08/09/2013)  . Arthritis     "thumb joints" (08/09/2013)  . Kidney stones X 2    "passed them both"  . Peripheral vascular disease ([place])   . Colon cancer ([place]) 03/2013    Social History  Substance Use Topics  . Smoking status: Never Smoker   . Smokeless tobacco: Never Used     Comment: states his father died of emphysema from smoking, he did not want to be like that  . Alcohol Use: 1.8 oz/week    3 Glasses of wine per week     Comment: occasionally    Family History  Problem Relation Age of Onset  . Heart disease Mother   . Emphysema Father     Allergies  Allergen Reactions  . Amlodipine Swelling    Right LE edema     Current outpatient prescriptions:  .  Budesonide (UCERIS) 9 MG TB24, Take 9 mg by mouth daily. , Disp: , Rfl:  .  carvedilol (COREG) 6.25 MG tablet, Take 1 tablet (6.25 mg total) by mouth 2 (two) times daily., Disp: , Rfl:  .  losartan (COZAAR) 100 MG tablet, Take 1  tablet (100 mg total) by mouth daily., Disp: 90 tablet, Rfl: 3 .  rivaroxaban (XARELTO) 20 MG TABS tablet, Take 1 tablet (20 mg total) by mouth daily with breakfast., Disp: 30 tablet, Rfl: 0 .  spironolactone (ALDACTONE) 25 MG tablet, Take 1 tablet (25 mg total) by mouth daily., Disp: 90 tablet, Rfl: 3 .  acetaminophen (TYLENOL) 500 MG tablet, Take 1,000 mg by mouth 3 (three) times daily as needed (pain). Reported on 01/22/2015, Disp: , Rfl:  .  diphenhydramine-acetaminophen (TYLENOL PM) 25-500 MG TABS, Take 1 tablet by mouth at bedtime as needed. Reported on 01/22/2015, Disp: , Rfl:   Filed Vitals:   01/22/15 1551 01/22/15 1556  BP: 143/80 153/78  Pulse: 83 81  Temp: 97.2 F (36.2 C)   TempSrc: Oral   Resp: 16   Height: 5' 10"  (1.778 m)   Weight: 197 lb (89.359 kg)   SpO2: 96%     Body mass index is 28.27 kg/(m^2).          Review of Systems denies chest pain, dyspnea on exertion, PND, orthopnea, hemoptysis, see history of present illness     Objective:   Physical Exam BP 153/78 mmHg  Pulse 81  Temp(Src) 97.2 F (36.2 C) (Oral)  Resp 16  Ht  5' 10"  (1.778 m)  Wt 197 lb (89.359 kg)  BMI 28.27 kg/m2  SpO2 96%  Gen.-alert and oriented x3 in no apparent distress HEENT normal for age Lungs no rhonchi or wheezing Cardiovascular regular rhythm no murmurs carotid pulses 3+ palpable no bruits audible Abdomen soft nontender no palpable masses Musculoskeletal free of  major deformities Skin clear -no rashes Neurologic normal Lower extremities 3+ femoral pulses palpable bilaterally. No popliteal or distal pulses palpable in either leg. Both feet are adequately perfused with no evidence of gangrene and infection drainage or cellulitis. Sensation and motion is intact bilaterally.  Today I ordered bilateral ABIs. This has actually improved over the past 6 months with ABIs in the right posterior tibial being 0.63 and in the left dorsalis pedis being 0.88       Assessment:       stable lower extremities with severe diffuse vascular occlusive disease with improvement in ABIs. 2 failed attempts at revascularization and/or embolectomy of right leg but patient has maintained limb salvage History of colon cancer 2 years ago which is being followed in the cancer center and currently he is free of known cancer  On chronic Xarelto     Plan:      return in 1 year with recheck of ABIs and see nurse practitioner less symptoms worsen in the interim  He is not a candidate for further attempts at revascularization of the right leg

## 2015-01-22 NOTE — Progress Notes (Signed)
Filed Vitals:   01/22/15 1551 01/22/15 1556  BP: 143/80 153/78  Pulse: 83 81  Temp: 97.2 F (36.2 C)   TempSrc: Oral   Resp: 16   Height: 5' 10"  (1.778 m)   Weight: 197 lb (89.359 kg)   SpO2: 96%

## 2015-01-23 ENCOUNTER — Telehealth: Payer: Self-pay | Admitting: *Deleted

## 2015-01-23 NOTE — Addendum Note (Signed)
Addended by: Mena Goes on: 01/23/2015 04:43 PM   Modules accepted: Orders

## 2015-01-23 NOTE — Telephone Encounter (Signed)
Per Dr. Benay Spice; left voice message cea is normal; call office if questions.

## 2015-01-23 NOTE — Telephone Encounter (Signed)
-----   Message from [name], MD sent at 01/22/2015  8:32 PM EST ----- Please call patient, Cody Suarez is normal

## 2015-02-06 ENCOUNTER — Other Ambulatory Visit: Payer: Self-pay | Admitting: *Deleted

## 2015-02-06 DIAGNOSIS — I10 Essential (primary) hypertension: Secondary | ICD-10-CM

## 2015-02-06 MED ORDER — LOSARTAN POTASSIUM 100 MG PO TABS: 100.0000 mg | ORAL_TABLET | Freq: Every day | ORAL | Status: DC

## 2015-05-14 ENCOUNTER — Other Ambulatory Visit: Payer: Self-pay | Admitting: Cardiovascular Disease

## 2015-05-14 NOTE — Telephone Encounter (Signed)
Review for refill. 

## 2015-05-15 ENCOUNTER — Other Ambulatory Visit: Payer: Self-pay

## 2015-05-15 DIAGNOSIS — I739 Peripheral vascular disease, unspecified: Secondary | ICD-10-CM

## 2015-05-15 MED ORDER — RIVAROXABAN 20 MG PO TABS: 20.0000 mg | ORAL_TABLET | Freq: Every day | ORAL | Status: DC

## 2015-05-15 NOTE — Telephone Encounter (Signed)
Sent refill for xarelto.

## 2015-06-04 ENCOUNTER — Other Ambulatory Visit: Payer: Self-pay | Admitting: *Deleted

## 2015-06-04 DIAGNOSIS — I10 Essential (primary) hypertension: Secondary | ICD-10-CM

## 2015-06-04 MED ORDER — SPIRONOLACTONE 25 MG PO TABS: 25.0000 mg | ORAL_TABLET | Freq: Every day | ORAL | Status: DC

## 2015-06-05 ENCOUNTER — Encounter: Payer: Self-pay | Admitting: Cardiology

## 2015-06-12 ENCOUNTER — Other Ambulatory Visit: Payer: Self-pay | Admitting: Cardiovascular Disease

## 2015-06-13 NOTE — Telephone Encounter (Signed)
Please review for refill. Thanks!  

## 2015-07-05 ENCOUNTER — Encounter: Payer: Self-pay | Admitting: Cardiology

## 2015-07-07 ENCOUNTER — Other Ambulatory Visit: Payer: Self-pay | Admitting: Cardiovascular Disease

## 2015-07-08 ENCOUNTER — Other Ambulatory Visit: Payer: Self-pay | Admitting: *Deleted

## 2015-07-08 NOTE — Telephone Encounter (Signed)
Review for refill, Thank you.

## 2015-07-11 ENCOUNTER — Telehealth: Payer: Self-pay | Admitting: Cardiovascular Disease

## 2015-07-11 DIAGNOSIS — I10 Essential (primary) hypertension: Secondary | ICD-10-CM

## 2015-07-11 MED ORDER — SPIRONOLACTONE 25 MG PO TABS: 25.0000 mg | ORAL_TABLET | Freq: Every day | ORAL | Status: DC

## 2015-07-11 NOTE — Telephone Encounter (Signed)
New message       *STAT* If patient is at the pharmacy, call can be transferred to refill team.   1. Which medications need to be refilled? (please list name of each medication and dose if known) Spironolactone 25 mg po daily  2. Which pharmacy/location (including street and city if local pharmacy) is medication to be sent to?[facility] in [place]   3. Do they need a 30 day or 90 day supply? 30 day

## 2015-07-13 ENCOUNTER — Other Ambulatory Visit: Payer: Self-pay | Admitting: Cardiovascular Disease

## 2015-07-15 NOTE — Telephone Encounter (Signed)
Please review for refill. Thanks!  

## 2015-07-30 ENCOUNTER — Ambulatory Visit (INDEPENDENT_AMBULATORY_CARE_PROVIDER_SITE_OTHER): Payer: 59 | Admitting: Cardiovascular Disease

## 2015-07-30 ENCOUNTER — Encounter: Payer: Self-pay | Admitting: Cardiovascular Disease

## 2015-07-30 VITALS — BP 120/76 | HR 68 | Ht 70.0 in | Wt 196.4 lb

## 2015-07-30 DIAGNOSIS — I739 Peripheral vascular disease, unspecified: Secondary | ICD-10-CM | POA: Diagnosis not present

## 2015-07-30 DIAGNOSIS — I10 Essential (primary) hypertension: Secondary | ICD-10-CM

## 2015-07-30 LAB — CBC
HEMATOCRIT: 44.4 % (ref 38.5–50.0)
HEMOGLOBIN: 15.3 g/dL (ref 13.2–17.1)
MCH: 30.7 pg (ref 27.0–33.0)
MCHC: 34.5 g/dL (ref 32.0–36.0)
MCV: 89.2 fL (ref 80.0–100.0)
MPV: 9.1 fL (ref 7.5–12.5)
Platelets: 370 10*3/uL (ref 140–400)
RBC: 4.98 MIL/uL (ref 4.20–5.80)
RDW: 13.8 % (ref 11.0–15.0)
WBC: 9.7 10*3/uL (ref 3.8–10.8)

## 2015-07-30 LAB — BASIC METABOLIC PANEL
BUN: 14 mg/dL (ref 7–25)
CO2: 25 mmol/L (ref 20–31)
Calcium: 9.3 mg/dL (ref 8.6–10.3)
Chloride: 105 mmol/L (ref 98–110)
Creat: 1.25 mg/dL — ABNORMAL HIGH (ref 0.70–1.18)
Glucose, Bld: 108 mg/dL — ABNORMAL HIGH (ref 65–99)
POTASSIUM: 4.6 mmol/L (ref 3.5–5.3)
SODIUM: 140 mmol/L (ref 135–146)

## 2015-07-30 NOTE — Progress Notes (Signed)
Cardiology Office Note   Date:  07/30/2015   ID:  Cody, [name] Suarez 13, 1947, MRN [id number]  PCP:  Betty Martinique, MD  Cardiologist:   Kathlyn Sacramento, MD   Chief Complaint  Patient presents with  . Follow-up  . PAD      History of Present Illness: Cody Suarez is a 70 y.o. male who presents for This is a 70 year old pleasant man who is here today for a follow-up visit regarding  peripheral arterial disease and hypertension.  He was diagnosed with stage II colon cancer early 2015 and underwent partial colectomy in February followed by chemotherapy with Xeloda . He was seen in July 2015 for bilateral calf discomfort which started about 2-3 months prior to evaluation. He underwent lower extremity venous duplex which showed no evidence of DVT. ABI was moderately to severely reduced bilaterally at 0.52 on the right and 0.6 on the left. Angiography was done in August 2015 which showed: 1. No significant aortoiliac disease. 2. 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 One-vessel runoff below the knee bilaterally via the peroneal artery which is occluded proximally but does reconstitute via collaterals. TEE was performed to evaluate source of embolism. It showed normal LV systolic function and wall motion. There was a mobile thrombus in the proximal ascending aorta on top of an ulcerated plaque. CTA showed an ulcerated plaque in the proximal descending aorta with thrombus. She was evaluated by Dr. Trula Slade. Anticoagulation was recommended and he has been on Xarelto since then. He underwent right SFA to posterior tibial bypass by Dr. Kellie Simmering multiple times with poor outcome due to poor runoff.  He has been doing reasonably well overall with very mild right calf claudication which does not limit his physical activities. No chest pain or shortness of breath. Blood pressure was running high last year and ultimately got  controlled on carvedilol, losartan and spironolactone. He continues to take Anticoagulation with no reported side effects. He denies any chest pain or shortness of breath. He continues to follow-up at [facility]. Most recent ABI showed improvement on the right side.    Past Medical History:  Diagnosis Date  . Anemia of chronic disease 2015   "related to cancer tx" (08/09/2013)  . Arthritis    "thumb joints" (08/09/2013)  . Colon cancer ([place]) 03/2013  . Diarrhea   . Hypertension   . Kidney stones X 2   "passed them both"  . Peripheral vascular disease ([place])   . Ulcerative colitis ([place])    history    Past Surgical History:  Procedure Laterality Date  . ABDOMINAL AORTAGRAM N/A 08/09/2013   Procedure: ABDOMINAL Maxcine Ham;  Surgeon: [name], MD;  Location: [facility];  Service: Cardiovascular;  Laterality: N/A;  . CHOLECYSTECTOMY N/A 09/08/2014   Procedure: LAPAROSCOPIC CHOLECYSTECTOMY WITH INTRAOPERATIVE CHOLANGIOGRAM;  Surgeon: Armandina Gemma, MD;  Location: [facility];  Service: General;  Laterality: N/A;  . COLON SURGERY    . FEMORAL-POPLITEAL BYPASS GRAFT Right 08/11/2013   Procedure:   RIGHT - POPLITEAL TO PERONEAL ARTERY BYPASS GRAFT  WITH NONREVERSED SAPHENOUS VEIN GRAFT,tHROMBECTOMY ANTERIOR TIBIALIS,ATTEMPTED THROMBECTOMY TIBIO-PERONEAL TRUNK AND POSTERIOR TIBIALIS, INTRAOPERATIVE ARTERIOGRAM.;  Surgeon: Mal Misty, MD;  Location: [facility];  Service: Vascular;  Laterality: Right;  . FEMORAL-TIBIAL BYPASS GRAFT Right 11/17/2013   Procedure: BYPASS GRAFT RIGHT ABOVE KNEE POPLITEAL TO POSTERIOR TIBIAL ARTERY USING RIGHT NON-REVERSED CEPHALIC VEIN;  Surgeon: Mal Misty, MD;  Location:  [facility];  Service: Vascular;  Laterality: Right;  . HERNIA REPAIR  ~ 1995; 03/2013   UHR  . INTRAOPERATIVE ARTERIOGRAM Right 11/17/2013   Procedure: INTRA OPERATIVE ARTERIOGRAM;  Surgeon: Mal Misty, MD;  Location: [facility];  Service: Vascular;  Laterality: Right;  . LAPAROSCOPIC RIGHT HEMI COLECTOMY N/A  03/28/2013   Procedure: LAPAROSCOPIC ASSISTED HEMI COLECTOMY;  Surgeon: Pedro Earls, MD;  Location: [facility];  Service: General;  Laterality: N/A;  . LOWER EXTREMITY ANGIOGRAM Bilateral 08/09/2013  . TEE WITHOUT CARDIOVERSION N/A 08/10/2013   Procedure: TRANSESOPHAGEAL ECHOCARDIOGRAM (TEE);  Surgeon: Candee Furbish, MD;  Location: [facility] ENDOSCOPY;  Service: Cardiovascular;  Laterality: N/A;  . TONSILLECTOMY  ~ 1950  . [place]; 03/2013  . VASECTOMY    . VEIN HARVEST Right 11/17/2013   Procedure: HARVEST OF RIGHT UPPER EXTREMITY CEPHALIC VEIN;  Surgeon: Mal Misty, MD;  Location: [facility];  Service: Vascular;  Laterality: Right;     Current Outpatient Prescriptions  Medication Sig Dispense Refill  . acetaminophen (TYLENOL) 500 MG tablet Take 1,000 mg by mouth 3 (three) times daily as needed (pain). Reported on 01/22/2015    . Budesonide (UCERIS) 9 MG TB24 Take 9 mg by mouth daily.     . carvedilol (COREG) 6.25 MG tablet Take 1 tablet (6.25 mg total) by mouth 2 (two) times daily.    . diphenhydramine-acetaminophen (TYLENOL PM) 25-500 MG TABS Take 1 tablet by mouth at bedtime as needed. Reported on 01/22/2015    . losartan (COZAAR) 100 MG tablet Take 1 tablet (100 mg total) by mouth daily. 90 tablet 3  . XARELTO 20 MG TABS tablet TAKE 1 TABLET DAILY WITH BREAKFAST 30 tablet 0  . spironolactone (ALDACTONE) 25 MG tablet Take 1 tablet (25 mg total) by mouth daily. 30 tablet 0   No current facility-administered medications for this visit.     Allergies:   Amlodipine; Amitriptyline; Gabapentin; and Pregabalin    Social History:  The patient  reports that he has never smoked. He has never used smokeless tobacco. He reports that he drinks about 1.8 oz of alcohol per week . He reports that he does not use drugs.   Family History:  The patient's family history includes Emphysema in his father; Heart disease in his mother.    ROS:  Please see the history of present illness.    Otherwise, review of systems are positive for none.   All other systems are reviewed and negative.    PHYSICAL EXAM: VS:  BP 120/76 (BP Location: Left Arm, Patient Position: Sitting, Cuff Size: Normal)   Pulse 68   Ht 5' 10"  (1.778 m)   Wt 196 lb 6.4 oz (89.1 kg)   SpO2 96%   BMI 28.18 kg/m  , BMI Body mass index is 28.18 kg/m. GEN: Well nourished, well developed, in no acute distress  HEENT: normal  Neck: no JVD, carotid bruits, or masses Cardiac: RRR; no murmurs, rubs, or gallops,Trace edema  Respiratory:  clear to auscultation bilaterally, normal work of breathing GI: soft, nontender, nondistended, + BS MS: no deformity or atrophy  Skin: warm and dry, no rash Neuro:  Strength and sensation are intact Psych: euthymic mood, full affect   EKG:  EKG is ordered today. The ekg ordered today demonstrates normal sinus rhythm with poor.   Recent Labs: 09/07/2014: TSH 2.305 09/10/2014: ALT 50; BUN 14; Creatinine, Ser 1.02; Hemoglobin 12.2; Platelets 301; Potassium 3.9; Sodium 137    Lipid Panel  Component Value Date/Time   CHOL 141 08/10/2013 0422   TRIG 85 08/10/2013 0422   HDL 59 08/10/2013 0422   CHOLHDL 2.4 08/10/2013 0422   VLDL 17 08/10/2013 0422   LDLCALC 65 08/10/2013 0422      Wt Readings from Last 3 Encounters:  07/30/15 196 lb 6.4 oz (89.1 kg)  01/22/15 197 lb (89.4 kg)  01/21/15 196 lb 11.2 oz (89.2 kg)         ASSESSMENT AND PLAN:  1.  Peripheral arterial disease with occluded tibial vessels due to embolization from ulcerative plaque in the aorta. Claudication is currently very mild and not lifestyle limiting. Continue medical therapy and lifelong anticoagulation. Given that he is on anticoagulation, I requested CBC  2. Essential hypertension: Blood pressure is now well controlled on carvedilol, losartan and spironolactone. I requested basic metabolic profile  3. History of colon cancer: Currently in remission.   Disposition:   FU with me in 1  year  Signed,  Kathlyn Sacramento, MD  07/30/2015 9:51 AM    [place]

## 2015-07-30 NOTE — Patient Instructions (Signed)
Medication Instructions:  Your physician recommends that you continue on your current medications as directed. Please refer to the Current Medication list given to you today.  Labwork: Your physician recommends that you have lab work today: BMP and CBC  Testing/Procedures: No new orders  Follow-Up: Your physician wants you to follow-up in: 1 year with Dr Fletcher Anon. You will receive a reminder letter in the mail two months in advance. If you don't receive a letter, please call our office to schedule the follow-up appointment.   Any Other Special Instructions Will Be Listed Below (If Applicable).     If you need a refill on your cardiac medications before your next appointment, please call your pharmacy.

## 2015-07-31 ENCOUNTER — Telehealth: Payer: Self-pay | Admitting: Cardiovascular Disease

## 2015-07-31 NOTE — Telephone Encounter (Signed)
Follow-up       The pt is returning the nurses call from this morning

## 2015-07-31 NOTE — Telephone Encounter (Signed)
Notified of lab results. 

## 2015-08-02 ENCOUNTER — Other Ambulatory Visit: Payer: Self-pay | Admitting: Gastroenterology

## 2015-08-10 ENCOUNTER — Other Ambulatory Visit: Payer: Self-pay | Admitting: Cardiovascular Disease

## 2015-08-10 DIAGNOSIS — I10 Essential (primary) hypertension: Secondary | ICD-10-CM

## 2015-08-12 ENCOUNTER — Other Ambulatory Visit: Payer: Self-pay

## 2015-08-12 MED ORDER — CARVEDILOL 6.25 MG PO TABS: 6.2500 mg | ORAL_TABLET | Freq: Two times a day (BID) | ORAL | 6 refills | Status: DC

## 2015-08-12 NOTE — Telephone Encounter (Signed)
Please review for refill. Thanks!  

## 2015-08-12 NOTE — Telephone Encounter (Signed)
See refill below!

## 2015-08-15 ENCOUNTER — Other Ambulatory Visit: Payer: Self-pay | Admitting: Cardiovascular Disease

## 2015-09-11 ENCOUNTER — Other Ambulatory Visit: Payer: Self-pay | Admitting: *Deleted

## 2015-09-12 ENCOUNTER — Telehealth: Payer: Self-pay | Admitting: *Deleted

## 2015-09-12 NOTE — Telephone Encounter (Signed)
Received call from pt asking about CT appt.  He thought he was to receive before next MD appt. Message to [employer] to f/u on prior auth.  Called [employer] & they report that it has been authorized & she will call pt to schedule on 09/17/15.

## 2015-09-13 ENCOUNTER — Telehealth: Payer: Self-pay | Admitting: Oncology

## 2015-09-13 NOTE — Telephone Encounter (Signed)
Patient notified of 09/20/2015 appointment changes.

## 2015-09-17 ENCOUNTER — Other Ambulatory Visit (HOSPITAL_BASED_OUTPATIENT_CLINIC_OR_DEPARTMENT_OTHER): Payer: 59

## 2015-09-17 ENCOUNTER — Ambulatory Visit (HOSPITAL_COMMUNITY)
Admission: RE | Admit: 2015-09-17 | Discharge: 2015-09-17 | Disposition: A | Discharge status: Home / Self Care | Payer: 59 | Source: Ambulatory Visit | Attending: Nurse Practitioner | Admitting: Nurse Practitioner

## 2015-09-17 DIAGNOSIS — R1900 Intra-abdominal and pelvic swelling, mass and lump, unspecified site: Secondary | ICD-10-CM | POA: Insufficient documentation

## 2015-09-17 DIAGNOSIS — R918 Other nonspecific abnormal finding of lung field: Secondary | ICD-10-CM | POA: Diagnosis not present

## 2015-09-17 DIAGNOSIS — Z9049 Acquired absence of other specified parts of digestive tract: Secondary | ICD-10-CM | POA: Insufficient documentation

## 2015-09-17 DIAGNOSIS — K869 Disease of pancreas, unspecified: Secondary | ICD-10-CM | POA: Diagnosis not present

## 2015-09-17 DIAGNOSIS — N200 Calculus of kidney: Secondary | ICD-10-CM | POA: Diagnosis not present

## 2015-09-17 DIAGNOSIS — C184 Malignant neoplasm of transverse colon: Secondary | ICD-10-CM | POA: Insufficient documentation

## 2015-09-17 LAB — BASIC METABOLIC PANEL WITH GFR
Anion Gap: 10 meq/L (ref 3–11)
BUN: 18.3 mg/dL (ref 7.0–26.0)
CO2: 24 meq/L (ref 22–29)
Calcium: 9.7 mg/dL (ref 8.4–10.4)
Chloride: 105 meq/L (ref 98–109)
Creatinine: 1.2 mg/dL (ref 0.7–1.3)
EGFR: 62 ml/min/1.73 m2 — ABNORMAL LOW
Glucose: 108 mg/dL (ref 70–140)
Potassium: 4.3 meq/L (ref 3.5–5.1)
Sodium: 139 meq/L (ref 136–145)

## 2015-09-17 LAB — CEA (IN HOUSE-CHCC): CEA (CHCC-IN HOUSE): 2.11 ng/mL (ref 0.00–5.00)

## 2015-09-17 MED ORDER — IOPAMIDOL (ISOVUE-300) INJECTION 61%
100.0000 mL | Freq: Once | INTRAVENOUS | Status: AC | PRN
  Administered 2015-09-17: 100 mL via INTRAVENOUS

## 2015-09-18 ENCOUNTER — Telehealth: Payer: Self-pay

## 2015-09-18 LAB — CEA: CEA1: 2.7 ng/mL (ref 0.0–4.7)

## 2015-09-18 NOTE — Telephone Encounter (Signed)
Opal Sidles from [facility] radiology called and report was given from pts CT done yesterday 9/12. Report given to Ned Card, NP.

## 2015-09-20 ENCOUNTER — Ambulatory Visit: Payer: 59 | Admitting: Oncology

## 2015-09-23 ENCOUNTER — Ambulatory Visit (HOSPITAL_BASED_OUTPATIENT_CLINIC_OR_DEPARTMENT_OTHER): Payer: 59 | Admitting: Oncology

## 2015-09-23 ENCOUNTER — Telehealth: Payer: Self-pay | Admitting: Oncology

## 2015-09-23 VITALS — BP 151/69 | HR 93 | Temp 98.5°F | Resp 16 | Ht 70.0 in | Wt 194.9 lb

## 2015-09-23 DIAGNOSIS — C184 Malignant neoplasm of transverse colon: Secondary | ICD-10-CM | POA: Diagnosis not present

## 2015-09-23 DIAGNOSIS — R911 Solitary pulmonary nodule: Secondary | ICD-10-CM

## 2015-09-23 DIAGNOSIS — K869 Disease of pancreas, unspecified: Secondary | ICD-10-CM | POA: Diagnosis not present

## 2015-09-23 DIAGNOSIS — I10 Essential (primary) hypertension: Secondary | ICD-10-CM

## 2015-09-23 NOTE — Progress Notes (Signed)
[facility] OFFICE PROGRESS NOTE   Diagnosis: Colon cancer  INTERVAL HISTORY:   Cody Suarez returns as scheduled. He feels well. No diarrhea or bleeding. No complaint. He underwent a colonoscopy on 08/02/2015, there was evidence of improved ulcerative colitis. Biopsies revealed chronic colitis with no dysplasia or malignancy.  He underwent surveillance CT scans of the chest, abdomen, and pelvis on 09/17/2015. A new mass was noted inferior to the tail the pancreas, felt to arise in the mesentery. Stable pulmonary nodules.  Objective:  Vital signs in last 24 hours:  Blood pressure (!) 151/69, pulse 93, temperature 98.5 F (36.9 C), temperature source Oral, resp. rate 16, height 5' 10"  (1.778 m), weight 194 lb 14.4 oz (88.4 kg), SpO2 97 %.    HEENT: Neck without mass Lymphatics: No cervical, supraclavicular, axillary, or inguinal nodes Resp: Lungs clear bilaterally Cardio: Regular rate and rhythm GI: No hepatosplenomegaly, no mass, nontender, reducible umbilical hernia Vascular: No leg edema, stasis change at the lower leg bilaterally   Lab Results:  Lab Results  Component Value Date   WBC 9.7 07/30/2015   HGB 15.3 07/30/2015   HCT 44.4 07/30/2015   MCV 89.2 07/30/2015   PLT 370 07/30/2015   NEUTROABS 10.8 (H) 09/06/2014    CEA on 09/17/2015-2. Imaging:  CTs 09/17/2015-peripancreatic/mesenteric mass, images reviewed with Cody Suarez.  Medications: I have reviewed the patient's current medications.  Assessment/Plan: 1. 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 Negative for a BRAF mutation  Tumor invaded through the muscularis propria into pericolonic fatty tissue and involved the attached omentum.   Cycle 1 adjuvant Xeloda 05/28/2013.   Cycle 2 adjuvant Xeloda 06/18/2013.    Cycle 3 adjuvant Xeloda 07/09/2013.   Cycle 4 adjuvant Xeloda 07/30/2013.   Cycle 5 adjuvant Xeloda 08/20/2013.  Cycle 6 adjuvant Xeloda 09/11/2013.  cycle 7 adjuvant Xeloda 10/02/2013  Cycle 8 adjuvant Xeloda 10/23/2013  CTs of the chest, abdomen, and pelvis on 07/18/2014-negative for recurrent colon cancer   CTs abdomen/pelvis 09/06/2014 showed acute cholecystitis.  Colonoscopy 08/02/2015-chronic colitis, no malignancy  CTs 09/17/2015, new mass near the tail the pancreas, stable lung nodules 2. Ulcerative colitis-extensive chronic active ulcerative colitis was noted on the colon resection specimen 03/28/2013.  Followed by Dr. Watt Climes 3. Hypertension.  4. History of microcytic anemia-likely iron deficiency, unable to tolerate oral iron. Improved. 5. Possible area of cecal wall thickening noted on abdominal CT 03/20/2013. 6. Family history of colon cancer (maternal grandfather died in his 73s with colon cancer). 7. Bilateral calf and low anterior leg pain. Bilateral lower extremity venous duplex negative for DVT 07/14/2013. Right ABI with moderate, borderline severe arterial insufficiency; left ABI suggestive of moderate arterial insufficiency. He was referred to vascular. 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 TEE showed thrombus in the descending aorta. He underwent right popliteal to peroneal artery bypass graft, thrombectomy anterior tibialis and attempted thrombectomy tibioperoneal trunk and posterior tibialis on 08/11/2013. Right calf pain 09/26/2013 with findings of occlusion of right popliteal to peroneal bypass status post thrombolysis. Now on anticoagulation with Xarelto.  recurrent pain and ulceration at the right foot, status post a popliteal to posterior tibial artery bypass using a cephalic vein graft on 60/73/7106 8. Status post laparoscopic cholecystectomy 09/08/2014    Disposition:  CodySuarez appears stable. The restaging CTs  revealing new mass near the tail of the pancreas. This  most likely represents a local recurrence of colon cancer, with a differential diagnosis including other causes such as pancreas cancer.  I discussed the CT finding and reviewed the images with him. His case will be presented at the GI tumor conference on 09/25/2015. We will decide on the indication for a PET scan and/or a biopsy based on the GI tumor conference discussion.  He will return for an office visit in 3 weeks.   Betsy Coder, MD  09/23/2015  12:39 PM

## 2015-09-23 NOTE — Telephone Encounter (Signed)
Avs report and appointment schedule given to patient, per 09/23/15 los.

## 2015-09-25 ENCOUNTER — Telehealth: Payer: Self-pay | Admitting: *Deleted

## 2015-09-25 ENCOUNTER — Other Ambulatory Visit: Payer: Self-pay | Admitting: *Deleted

## 2015-09-25 ENCOUNTER — Encounter: Payer: Self-pay | Admitting: Genetic Counselor

## 2015-09-25 DIAGNOSIS — C184 Malignant neoplasm of transverse colon: Secondary | ICD-10-CM

## 2015-09-25 NOTE — Telephone Encounter (Signed)
Oncology Nurse Navigator Documentation  Oncology Nurse Navigator Flowsheets 09/25/2015  Navigator Location [facility]  Navigator Encounter Type Telephone  Telephone Outgoing Call;Patient Update--tumor board discussion  Barriers/Navigation Needs Coordination of Care;Education  Education Other--results of discussion in tumor board today  Interventions Coordination of Care--PET and biopsy  Coordination of Care Radiology  Acuity Level 2  Time Spent with Patient 15  Time Spent with Patient (Retired) -  Erica understands result of discussion today and agrees to proceed with PET and biopsy. Asking for PET on a Thursday and biopsy on a Friday if possible. Orders placed and note to managed care to work on prior authorization for PET. Will attempt to schedule both before his follow up on 10/15/15.

## 2015-09-26 ENCOUNTER — Telehealth: Payer: Self-pay | Admitting: *Deleted

## 2015-09-26 NOTE — Telephone Encounter (Signed)
Call received from patient to ask Dr. Benay Spice if it is OK for him to travel after biopsy the evening of 10/04/15.  Per Dr. Benay Spice, [place] for patient to travel after biopsy.  Call placed back to patient to inform him of MD orders and patient appreciative of call.

## 2015-09-30 ENCOUNTER — Telehealth: Payer: Self-pay | Admitting: *Deleted

## 2015-09-30 ENCOUNTER — Encounter: Payer: Self-pay | Admitting: *Deleted

## 2015-09-30 NOTE — Progress Notes (Signed)
PREP FOR MESENTERY MASS BIOPSY: Per Dr. Benay Spice "OK to hold Xarelto for 1 day for procedure". Also confirmed with MD OK to do PET scan after the biopsy (awaiting peer to peer to obtain approval for PET).

## 2015-09-30 NOTE — Telephone Encounter (Signed)
Dr. Benay Spice completed peer-to-peer review for PET. Approval # [id number].  Will forward info to managed care.

## 2015-10-01 ENCOUNTER — Telehealth: Payer: Self-pay | Admitting: *Deleted

## 2015-10-01 ENCOUNTER — Other Ambulatory Visit: Payer: Self-pay | Admitting: Radiology

## 2015-10-01 NOTE — Telephone Encounter (Signed)
Spoke with patient and gave him appointment for PET and prep instructions. Confirmed that it was OK w/MD that PET is done after the biopsy appointment. Confirmed his follow up at [facility] as well.

## 2015-10-01 NOTE — Telephone Encounter (Signed)
  Oncology Nurse Navigator Documentation  Navigator Location: [facility] (10/01/15 9379) Navigator Encounter Type: Telephone (10/01/15 0902) Telephone: Jerilee Hoh Confirmation/Clarification (10/01/15 0902)   PET scan approved after MD completed peer to peer discussion. Scheduled for 10/10/15 at 0830/0900 at [facility]. Needs to be NPO after midnight. Left message on mobile # to call navigator about appointment and prep.

## 2015-10-02 ENCOUNTER — Other Ambulatory Visit: Payer: Self-pay | Admitting: Radiology

## 2015-10-03 ENCOUNTER — Other Ambulatory Visit: Payer: Self-pay | Admitting: General Surgery

## 2015-10-04 ENCOUNTER — Encounter (HOSPITAL_COMMUNITY): Payer: Self-pay

## 2015-10-04 ENCOUNTER — Ambulatory Visit (HOSPITAL_COMMUNITY)
Admission: RE | Admit: 2015-10-04 | Discharge: 2015-10-04 | Disposition: A | Discharge status: Home / Self Care | Payer: 59 | Source: Ambulatory Visit | Attending: Oncology | Admitting: Oncology

## 2015-10-04 DIAGNOSIS — Z888 Allergy status to other drugs, medicaments and biological substances status: Secondary | ICD-10-CM | POA: Insufficient documentation

## 2015-10-04 DIAGNOSIS — Z9049 Acquired absence of other specified parts of digestive tract: Secondary | ICD-10-CM | POA: Insufficient documentation

## 2015-10-04 DIAGNOSIS — Z87442 Personal history of urinary calculi: Secondary | ICD-10-CM | POA: Insufficient documentation

## 2015-10-04 DIAGNOSIS — Z7901 Long term (current) use of anticoagulants: Secondary | ICD-10-CM | POA: Diagnosis not present

## 2015-10-04 DIAGNOSIS — Z85038 Personal history of other malignant neoplasm of large intestine: Secondary | ICD-10-CM | POA: Diagnosis not present

## 2015-10-04 DIAGNOSIS — C184 Malignant neoplasm of transverse colon: Secondary | ICD-10-CM | POA: Insufficient documentation

## 2015-10-04 DIAGNOSIS — Z9852 Vasectomy status: Secondary | ICD-10-CM | POA: Insufficient documentation

## 2015-10-04 DIAGNOSIS — Z95828 Presence of other vascular implants and grafts: Secondary | ICD-10-CM | POA: Insufficient documentation

## 2015-10-04 DIAGNOSIS — I10 Essential (primary) hypertension: Secondary | ICD-10-CM | POA: Insufficient documentation

## 2015-10-04 DIAGNOSIS — C7889 Secondary malignant neoplasm of other digestive organs: Secondary | ICD-10-CM | POA: Insufficient documentation

## 2015-10-04 DIAGNOSIS — I739 Peripheral vascular disease, unspecified: Secondary | ICD-10-CM | POA: Diagnosis not present

## 2015-10-04 DIAGNOSIS — M1990 Unspecified osteoarthritis, unspecified site: Secondary | ICD-10-CM | POA: Insufficient documentation

## 2015-10-04 LAB — CBC WITH DIFFERENTIAL/PLATELET
BASOS ABS: 0.1 10*3/uL (ref 0.0–0.1)
BASOS PCT: 1 %
Eosinophils Absolute: 0.3 10*3/uL (ref 0.0–0.7)
Eosinophils Relative: 3 %
HEMATOCRIT: 45.7 % (ref 39.0–52.0)
HEMOGLOBIN: 15 g/dL (ref 13.0–17.0)
Lymphocytes Relative: 19 %
Lymphs Abs: 1.9 10*3/uL (ref 0.7–4.0)
MCH: 30.7 pg (ref 26.0–34.0)
MCHC: 32.8 g/dL (ref 30.0–36.0)
MCV: 93.6 fL (ref 78.0–100.0)
MONOS PCT: 10 %
Monocytes Absolute: 1 10*3/uL (ref 0.1–1.0)
NEUTROS ABS: 6.5 10*3/uL (ref 1.7–7.7)
NEUTROS PCT: 67 %
Platelets: 276 10*3/uL (ref 150–400)
RBC: 4.88 MIL/uL (ref 4.22–5.81)
RDW: 13.8 % (ref 11.5–15.5)
WBC: 9.8 10*3/uL (ref 4.0–10.5)

## 2015-10-04 LAB — PROTIME-INR
INR: 1
PROTHROMBIN TIME: 13.2 s (ref 11.4–15.2)

## 2015-10-04 MED ORDER — FENTANYL CITRATE (PF) 100 MCG/2ML IJ SOLN
INTRAMUSCULAR | Status: AC
  Filled 2015-10-04: qty 4

## 2015-10-04 MED ORDER — FENTANYL CITRATE (PF) 100 MCG/2ML IJ SOLN
INTRAMUSCULAR | Status: AC | PRN
  Administered 2015-10-04: 50 ug via INTRAVENOUS

## 2015-10-04 MED ORDER — MIDAZOLAM HCL 2 MG/2ML IJ SOLN
INTRAMUSCULAR | Status: AC | PRN
  Administered 2015-10-04: 0.5 mg via INTRAVENOUS
  Administered 2015-10-04: 1 mg via INTRAVENOUS

## 2015-10-04 MED ORDER — MIDAZOLAM HCL 2 MG/2ML IJ SOLN
INTRAMUSCULAR | Status: AC
  Filled 2015-10-04: qty 4

## 2015-10-04 MED ORDER — SODIUM CHLORIDE 0.9 % IV SOLN
INTRAVENOUS | Status: DC
  Administered 2015-10-04: 07:00:00 via INTRAVENOUS

## 2015-10-04 NOTE — Procedures (Signed)
Technically successful CT guided biopsy of indeterminate mesenteric mass adjacent to the caudal aspect of the tail of the pancreas.  EBL: None.  No immediate post procedural complications.   Ronny Bacon, MD Pager #: [phone]

## 2015-10-04 NOTE — Discharge Instructions (Signed)
Needle Biopsy, Care After °These instructions give you information about caring for yourself after your procedure. Your doctor may also give you more specific instructions. Call your doctor if you have any problems or questions after your procedure. °HOME CARE °· Rest as told by your doctor. °· Take medicines only as told by your doctor. °· There are many different ways to close and cover the biopsy site, including stitches (sutures), skin glue, and adhesive strips. Follow instructions from your doctor about: °¨ How to take care of your biopsy site. °¨ When and how you should change your bandage (dressing). °¨ When you should remove your dressing. °¨ Removing whatever was used to close your biopsy site. °· Check your biopsy site every day for signs of infection. Watch for: °¨ Redness, swelling, or pain. °¨ Fluid, blood, or pus. °GET HELP IF: °· You have a fever. °· You have redness, swelling, or pain at the biopsy site, and it lasts longer than a few days. °· You have fluid, blood, or pus coming from the biopsy site. °· You feel sick to your stomach (nauseous). °· You throw up (vomit). °GET HELP RIGHT AWAY IF: °· You are short of breath. °· You have trouble breathing. °· Your chest hurts. °· You feel dizzy or you pass out (faint). °· You have bleeding that does not stop with pressure or a bandage. °· You cough up blood. °· Your belly (abdomen) hurts. °  °This information is not intended to replace advice given to you by your health care provider. Make sure you discuss any questions you have with your health care provider. °  °Document Released: 12/05/2007 Document Revised: 05/08/2014 Document Reviewed: 12/18/2013 °Elsevier Interactive Patient Education ©2016 [employer] °Moderate Conscious Sedation, Adult, Care After °Refer to this sheet in the next few weeks. These instructions provide you with information on caring for yourself after your procedure. Your health care provider may also give you more specific  instructions. Your treatment has been planned according to current medical practices, but problems sometimes occur. Call your health care provider if you have any problems or questions after your procedure. °WHAT TO EXPECT AFTER THE PROCEDURE  °After your procedure: °· You may feel sleepy, clumsy, and have poor balance for several hours. °· Vomiting may occur if you eat too soon after the procedure. °HOME CARE INSTRUCTIONS °· Do not participate in any activities where you could become injured for at least 24 hours. Do not: °· Drive. °· Swim. °· Ride a bicycle. °· Operate heavy machinery. °· Cook. °· Use power tools. °· Climb ladders. °· Work from a high place. °· Do not make important decisions or sign legal documents until you are improved. °· If you vomit, drink water, juice, or soup when you can drink without vomiting. Make sure you have little or no nausea before eating solid foods. °· Only take over-the-counter or prescription medicines for pain, discomfort, or fever as directed by your health care provider. °· Make sure you and your family fully understand everything about the medicines given to you, including what side effects may occur. °· You should not drink alcohol, take sleeping pills, or take medicines that cause drowsiness for at least 24 hours. °· If you smoke, do not smoke without supervision. °· If you are feeling better, you may resume normal activities 24 hours after you were sedated. °· Keep all appointments with your health care provider. °SEEK MEDICAL CARE IF: °· Your skin is pale or bluish in color. °· You   continue to feel nauseous or vomit. °· Your pain is getting worse and is not helped by medicine. °· You have bleeding or swelling. °· You are still sleepy or feeling clumsy after 24 hours. °SEEK IMMEDIATE MEDICAL CARE IF: °· You develop a rash. °· You have difficulty breathing. °· You develop any type of allergic problem. °· You have a fever. °MAKE SURE YOU: °· Understand these  instructions. °· Will watch your condition. °· Will get help right away if you are not doing well or get worse. °  °This information is not intended to replace advice given to you by your health care provider. Make sure you discuss any questions you have with your health care provider. °  °Document Released: 10/12/2012 Document Revised: 01/12/2014 Document Reviewed: 10/12/2012 °Elsevier Interactive Patient Education ©2016 [employer]. ° °

## 2015-10-04 NOTE — Progress Notes (Signed)
Referring Physician(s): [name]  Supervising Physician: Sandi Mariscal  Patient Status:  Outpatient  Chief Complaint:  "I'm here for a biopsy"  Subjective: Patient familiar to IR service from prior right lower extremity arterial thrombolysis of an occluded popliteal/peroneal bypass in 2015. He has a known history of colon cancer with partial colectomy in 2015 followed by chemotherapy. 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 He presents today  for CT guided biopsy of this mesenteric mass. He currently denies fever, headache, chest pain, dyspnea, cough, abdominal/back pain, nausea, vomiting or abnormal bleeding.  Past Medical History:  Diagnosis Date  . Anemia of chronic disease 2015   "related to cancer tx" (08/09/2013)  . Arthritis    "thumb joints" (08/09/2013)  . Colon cancer ([place]) 03/2013  . Diarrhea   . Hypertension   . Kidney stones X 2   "passed them both"  . Peripheral vascular disease ([place])   . Ulcerative colitis ([place])    history   Past Surgical History:  Procedure Laterality Date  . ABDOMINAL AORTAGRAM N/A 08/09/2013   Procedure: ABDOMINAL Maxcine Ham;  Surgeon: [name], MD;  Location: [facility] CATH [facility];  Service: Cardiovascular;  Laterality: N/A;  . CHOLECYSTECTOMY N/A 09/08/2014   Procedure: LAPAROSCOPIC CHOLECYSTECTOMY WITH INTRAOPERATIVE CHOLANGIOGRAM;  Surgeon: Armandina Gemma, MD;  [facility];  Service: General;  Laterality: N/A;  . COLON SURGERY    . FEMORAL-POPLITEAL BYPASS GRAFT Right 08/11/2013   Procedure:   RIGHT - POPLITEAL TO PERONEAL ARTERY BYPASS GRAFT  WITH NONREVERSED SAPHENOUS VEIN GRAFT,tHROMBECTOMY ANTERIOR TIBIALIS,ATTEMPTED THROMBECTOMY TIBIO-PERONEAL TRUNK AND POSTERIOR TIBIALIS, INTRAOPERATIVE ARTERIOGRAM.;  Surgeon: Mal Misty, MD;  Location: [facility];  Service: Vascular;  Laterality: Right;  . FEMORAL-TIBIAL BYPASS GRAFT Right 11/17/2013   Procedure: BYPASS GRAFT RIGHT  ABOVE KNEE POPLITEAL TO POSTERIOR TIBIAL ARTERY USING RIGHT NON-REVERSED CEPHALIC VEIN;  Surgeon: Mal Misty, MD;  Location: [facility];  Service: Vascular;  Laterality: Right;  . HERNIA REPAIR  ~ 1995; 03/2013   UHR  . INTRAOPERATIVE ARTERIOGRAM Right 11/17/2013   Procedure: INTRA OPERATIVE ARTERIOGRAM;  Surgeon: Mal Misty, MD;  Location: [facility];  Service: Vascular;  Laterality: Right;  . LAPAROSCOPIC RIGHT HEMI COLECTOMY N/A 03/28/2013   Procedure: LAPAROSCOPIC ASSISTED HEMI COLECTOMY;  Surgeon: Pedro Earls, MD;  [facility];  Service: General;  Laterality: N/A;  . LOWER EXTREMITY ANGIOGRAM Bilateral 08/09/2013  . TEE WITHOUT CARDIOVERSION N/A 08/10/2013   Procedure: TRANSESOPHAGEAL ECHOCARDIOGRAM (TEE);  Surgeon: Candee Furbish, MD;  Location[facility] ENDOSCOPY;  Service: Cardiovascular;  Laterality: N/A;  . TONSILLECTOMY  ~ 1950  . [place]; 03/2013  . VASECTOMY    . VEIN HARVEST Right 11/17/2013   Procedure: HARVEST OF RIGHT UPPER EXTREMITY CEPHALIC VEIN;  Surgeon: Mal Misty, MD;  Location: [facility];  Service: Vascular;  Laterality: Right;     Allergies: Amlodipine and Gabapentin  Medications: Prior to Admission medications   Medication Sig Start Date End Date Taking? Authorizing Provider  budesonide (ENTOCORT EC) 3 MG 24 hr capsule Take 3 mg by mouth 2 (two) times daily.   Yes Historical Provider, MD  carvedilol (COREG) 6.25 MG tablet Take 1 tablet (6.25 mg total) by mouth 2 (two) times daily. 08/12/15  Yes [name], MD  diphenhydramine-acetaminophen (TYLENOL PM) 25-500 MG TABS Take 1 tablet by mouth at bedtime as needed. Reported on 01/22/2015   Yes Historical Provider,  MD  losartan (COZAAR) 100 MG tablet Take 1 tablet (100 mg total) by mouth daily. 02/06/15  Yes [name], MD  spironolactone (ALDACTONE) 25 MG tablet TAKE 1 TABLET DAILY 08/12/15  Yes [name], MD  XARELTO 20 MG TABS tablet TAKE 1 TABLET DAILY WITH BREAKFAST 08/15/15   [name], MD     Vital Signs: BP (!) 148/70 (BP Location: Left Arm) Comment: RN notified  Pulse 68   Temp 97.8 F (36.6 C) (Oral)   Resp 16   Ht 5' 10"  (1.778 m)   Wt 194 lb 14.4 oz (88.4 kg)   SpO2 100%   BMI 27.97 kg/m   Physical Exam patient awake, alert. Chest clear to auscultation bilaterally. Heart with regular rate and rhythm. Abdomen obese, soft, positive bowel sounds, nontender. Lower extremities with 1-2+ edema bilaterally.  Imaging: No results found.  Labs:  CBC:  Recent Labs  07/30/15 1019 10/04/15 0720  WBC 9.7 9.8  HGB 15.3 15.0  HCT 44.4 45.7  PLT 370 276    COAGS:  Recent Labs  10/04/15 0720  INR 1.00    BMP:  Recent Labs  07/30/15 1019 09/17/15 1357  NA 140 139  K 4.6 4.3  CL 105  --   CO2 25 24  GLUCOSE 108* 108  BUN 14 18.3  CALCIUM 9.3 9.7  CREATININE 1.25* 1.2    LIVER FUNCTION TESTS: No results for input(s): BILITOT, AST, ALT, ALKPHOS, PROT, ALBUMIN in the last 8760 hours.  Assessment and Plan: Pt with  known history of colon cancer with partial colectomy in 2015 followed by chemotherapy. 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 He presents today  for CT guided biopsy of this mesenteric mass.Risks and benefits discussed with the patient/wife including, but not limited to bleeding, infection, damage to adjacent structures or low yield requiring additional tests.All of the patient's questions were answered, patient is agreeable to proceed.Consent signed and in chart.     Electronically Signed: D. Rowe Robert 10/04/2015, 8:11 AM   I spent a total of 25 minutes at the the patient's bedside AND on the patient's hospital floor or unit, greater than 50% of which was counseling/coordinating care for CT guided mesenteric mass biopsy    Patient ID: Cody Suarez, male   DOB: 23-Aug-1945, 70 y.o.   MRN: [id number]

## 2015-10-06 HISTORY — PX: COLON SURGERY: SHX602

## 2015-10-09 ENCOUNTER — Other Ambulatory Visit: Payer: Self-pay | Admitting: Family Medicine

## 2015-10-09 ENCOUNTER — Encounter: Payer: Self-pay | Admitting: Family Medicine

## 2015-10-09 ENCOUNTER — Ambulatory Visit (INDEPENDENT_AMBULATORY_CARE_PROVIDER_SITE_OTHER): Payer: 59 | Admitting: Family Medicine

## 2015-10-09 VITALS — BP 142/68 | HR 86 | Temp 98.1°F | Resp 16 | Ht 70.0 in | Wt 198.1 lb

## 2015-10-09 DIAGNOSIS — K5190 Ulcerative colitis, unspecified, without complications: Secondary | ICD-10-CM | POA: Diagnosis not present

## 2015-10-09 DIAGNOSIS — I10 Essential (primary) hypertension: Secondary | ICD-10-CM

## 2015-10-09 DIAGNOSIS — I749 Embolism and thrombosis of unspecified artery: Secondary | ICD-10-CM | POA: Diagnosis not present

## 2015-10-09 DIAGNOSIS — C184 Malignant neoplasm of transverse colon: Secondary | ICD-10-CM | POA: Diagnosis not present

## 2015-10-09 LAB — CBC WITH DIFFERENTIAL/PLATELET
BASOS ABS: 89 {cells}/uL (ref 0–200)
Basophils Relative: 1 %
EOS PCT: 2 %
Eosinophils Absolute: 178 cells/uL (ref 15–500)
HCT: 43.4 % (ref 38.5–50.0)
Hemoglobin: 14.8 g/dL (ref 13.2–17.1)
LYMPHS PCT: 15 %
Lymphs Abs: 1335 cells/uL (ref 850–3900)
MCH: 31.2 pg (ref 27.0–33.0)
MCHC: 34.1 g/dL (ref 32.0–36.0)
MCV: 91.4 fL (ref 80.0–100.0)
MONOS PCT: 7 %
MPV: 8.8 fL (ref 7.5–12.5)
Monocytes Absolute: 623 cells/uL (ref 200–950)
NEUTROS ABS: 6675 {cells}/uL (ref 1500–7800)
NEUTROS PCT: 75 %
PLATELETS: 306 10*3/uL (ref 140–400)
RBC: 4.75 MIL/uL (ref 4.20–5.80)
RDW: 13.4 % (ref 11.0–15.0)
WBC: 8.9 10*3/uL (ref 3.8–10.8)

## 2015-10-09 LAB — LIPID PANEL
CHOL/HDL RATIO: 4.7 ratio (ref <=5.0)
CHOLESTEROL: 187 mg/dL (ref 125–200)
HDL: 40 mg/dL (ref >=40)
LDL Cholesterol: 108 mg/dL (ref <130)
Triglycerides: 194 mg/dL — ABNORMAL HIGH (ref <150)
VLDL: 39 mg/dL — ABNORMAL HIGH (ref <30)

## 2015-10-09 LAB — BASIC METABOLIC PANEL
BUN: 15 mg/dL (ref 7–25)
CO2: 26 mmol/L (ref 20–31)
Calcium: 9.3 mg/dL (ref 8.6–10.3)
Chloride: 104 mmol/L (ref 98–110)
Creat: 1.14 mg/dL (ref 0.70–1.18)
GLUCOSE: 133 mg/dL — AB (ref 65–99)
POTASSIUM: 4.1 mmol/L (ref 3.5–5.3)
Sodium: 139 mmol/L (ref 135–146)

## 2015-10-09 LAB — HEPATIC FUNCTION PANEL
ALBUMIN: 3.9 g/dL (ref 3.6–5.1)
ALK PHOS: 42 U/L (ref 40–115)
ALT: 19 U/L (ref 9–46)
AST: 18 U/L (ref 10–35)
Bilirubin, Direct: 0.1 mg/dL (ref <=0.2)
Indirect Bilirubin: 0.3 mg/dL (ref 0.2–1.2)
TOTAL PROTEIN: 6.8 g/dL (ref 6.1–8.1)
Total Bilirubin: 0.4 mg/dL (ref 0.2–1.2)

## 2015-10-09 LAB — TSH: TSH: 1.2 m[IU]/L (ref 0.40–4.50)

## 2015-10-09 NOTE — Progress Notes (Signed)
   Subjective:    Patient ID: Cody Suarez, male    DOB: 1945/10/11, 70 y.o.   MRN: [id number]  HPI New to establish.  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  HTN- chronic problem, currently on Coreg, Losartan, Spironolactone w/ adequate but not ideal control.  Denies CP, SOB, HAs, visual changes, edema.  Arterial thromboembolism- chronic problem, following w/ Vascular [facility]).  On Xarelto due to hx of clotting- pt had clot in aorta that descended into both legs and cut off blood flow.  Continues to have swelling of R leg.  Colon cancer- s/p surgery.  Pt had recent CT bx- waiting on results.  Pt has PET scan tomorrow and then appt on 10/10.  Seeing Dr Benay Spice.  UC- chronic problem, following w/ Dr Watt Climes.  On Entocort twice daily.  Denies bloody stools or abdominal pain at this time   Review of Systems For ROS see HPI     Objective:   Physical Exam  Constitutional: He is oriented to person, place, and time. He appears well-developed and well-nourished. No distress.  HENT:  Head: Normocephalic and atraumatic.  Eyes: Conjunctivae and EOM are normal. Pupils are equal, round, and reactive to light.  Neck: Normal range of motion. Neck supple. No thyromegaly present.  Cardiovascular: Normal rate, regular rhythm, normal heart sounds and intact distal pulses.   No murmur heard. Pulmonary/Chest: Effort normal and breath sounds normal. No respiratory distress.  Abdominal: Soft. Bowel sounds are normal. He exhibits no distension.  Musculoskeletal: He exhibits edema (1+ R LE edema).  Lymphadenopathy:    He has no cervical adenopathy.  Neurological: He is alert and oriented to person, place, and time. No cranial nerve deficit.  Skin: Skin is warm and dry.  Psychiatric: He has a normal mood and affect. His behavior is normal.  Vitals reviewed.         Assessment & Plan:

## 2015-10-09 NOTE — Progress Notes (Signed)
Pre visit review using our clinic review tool, if applicable. No additional management support is needed unless otherwise documented below in the visit note. 

## 2015-10-09 NOTE — Patient Instructions (Signed)
Schedule your complete physical in 6 months We'll notify you of your lab results and make any changes if needed Continue to work on healthy diet and regular exercise- you can do it!! Call with any questions or concerns Welcome!  We're glad to have you!!!

## 2015-10-10 ENCOUNTER — Ambulatory Visit (HOSPITAL_COMMUNITY)
Admission: RE | Admit: 2015-10-10 | Discharge: 2015-10-10 | Disposition: A | Discharge status: Home / Self Care | Payer: 59 | Source: Ambulatory Visit | Attending: Oncology | Admitting: Oncology

## 2015-10-10 DIAGNOSIS — R918 Other nonspecific abnormal finding of lung field: Secondary | ICD-10-CM | POA: Diagnosis not present

## 2015-10-10 DIAGNOSIS — C184 Malignant neoplasm of transverse colon: Secondary | ICD-10-CM | POA: Diagnosis not present

## 2015-10-10 DIAGNOSIS — Z9889 Other specified postprocedural states: Secondary | ICD-10-CM | POA: Diagnosis not present

## 2015-10-10 DIAGNOSIS — R1902 Left upper quadrant abdominal swelling, mass and lump: Secondary | ICD-10-CM | POA: Insufficient documentation

## 2015-10-10 LAB — GLUCOSE, CAPILLARY: Glucose-Capillary: 112 mg/dL — ABNORMAL HIGH (ref 65–99)

## 2015-10-10 MED ORDER — FLUDEOXYGLUCOSE F - 18 (FDG) INJECTION
9.9000 | Freq: Once | INTRAVENOUS | Status: AC | PRN
  Administered 2015-10-10: 9.9 via INTRAVENOUS

## 2015-10-10 NOTE — Assessment & Plan Note (Signed)
New to provider.  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  Following w/ vascular.  Will follow along and assist as able.

## 2015-10-10 NOTE — Assessment & Plan Note (Signed)
New to provider, ongoing for pt.  He had surgery in 2015 but is having continued workup due to area suspicious for recurrence or mets.  Following w/ Dr Benay Spice.  Will follow along and assist as able.

## 2015-10-10 NOTE — Assessment & Plan Note (Signed)
Chronic problem, adequate but not ideal control.  Currently asymptomatic.  Will not make adjustments at first visit but will follow and make changes at future visits if needed.  Pt expressed understanding and is in agreement w/ plan.

## 2015-10-10 NOTE — Assessment & Plan Note (Signed)
New to provider, ongoing for pt.  Following w/ Dr Watt Climes.  On Entocort twice daily.  Currently asymptomatic.  Will follow along.

## 2015-10-11 ENCOUNTER — Telehealth: Payer: Self-pay

## 2015-10-11 LAB — HEMOGLOBIN A1C
Hgb A1c MFr Bld: 6 % — ABNORMAL HIGH (ref <5.7)
MEAN PLASMA GLUCOSE: 126 mg/dL

## 2015-10-11 NOTE — Telephone Encounter (Signed)
-----   Message from [name], MD sent at 10/10/2015  5:54 PM EDT ----- Please call patient, PET consistent with tumor at the known abdominal mass, no other evidence of metastatic disease, f/u as scheduled

## 2015-10-11 NOTE — Telephone Encounter (Signed)
Called and spoke with patients wife. Informed her PET scan was consistent with the known abdominal mass and no evidence of metastatic disease to follow up as scheduled per Dr. Benay Spice. Cody Suarez verbalized understanding and denies any questions or concerns at this time.

## 2015-10-15 ENCOUNTER — Encounter: Payer: Self-pay | Admitting: *Deleted

## 2015-10-15 ENCOUNTER — Telehealth: Payer: Self-pay | Admitting: Oncology

## 2015-10-15 ENCOUNTER — Ambulatory Visit (HOSPITAL_BASED_OUTPATIENT_CLINIC_OR_DEPARTMENT_OTHER): Payer: 59 | Admitting: Nurse Practitioner

## 2015-10-15 VITALS — BP 134/66 | HR 91 | Temp 98.0°F | Resp 18 | Ht 70.0 in | Wt 198.1 lb

## 2015-10-15 DIAGNOSIS — C184 Malignant neoplasm of transverse colon: Secondary | ICD-10-CM | POA: Diagnosis not present

## 2015-10-15 DIAGNOSIS — I10 Essential (primary) hypertension: Secondary | ICD-10-CM

## 2015-10-15 DIAGNOSIS — C786 Secondary malignant neoplasm of retroperitoneum and peritoneum: Secondary | ICD-10-CM

## 2015-10-15 DIAGNOSIS — D509 Iron deficiency anemia, unspecified: Secondary | ICD-10-CM

## 2015-10-15 DIAGNOSIS — R911 Solitary pulmonary nodule: Secondary | ICD-10-CM | POA: Diagnosis not present

## 2015-10-15 NOTE — Progress Notes (Addendum)
Cody Suarez OFFICE PROGRESS NOTE   Diagnosis:  Colon cancer  INTERVAL HISTORY:   Cody Suarez returns as scheduled. He feels well. No abdominal pain. Bowels moving regularly.  Objective:  Vital signs in last 24 hours:  Blood pressure 134/66, pulse 91, temperature 98 F (36.7 C), temperature source Oral, resp. rate 18, height 5' 10"  (1.778 m), weight 198 lb 1.6 oz (89.9 kg), SpO2 96 %.    Resp: Lungs clear bilaterally. Cardio: Regular rate and rhythm. GI: Abdomen soft and nontender. No hepatomegaly. No apparent ascites. Vascular: Pitting lower leg edema bilaterally.   Lab Results:  Lab Results  Component Value Date   WBC 8.9 10/09/2015   HGB 14.8 10/09/2015   HCT 43.4 10/09/2015   MCV 91.4 10/09/2015   PLT 306 10/09/2015   NEUTROABS 6,675 10/09/2015    Imaging:  No results found.  Medications: I have reviewed the patient's current medications.  Assessment/Plan: 1. 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 Negative for a BRAF mutation  Tumor invaded through the muscularis propria into pericolonic fatty tissue and involved the attached omentum.   Cycle 1 adjuvant Xeloda 05/28/2013.   Cycle 2 adjuvant Xeloda 06/18/2013.   Cycle 3 adjuvant Xeloda 07/09/2013.   Cycle 4 adjuvant Xeloda 07/30/2013.   Cycle 5 adjuvant Xeloda 08/20/2013.  Cycle 6 adjuvant Xeloda 09/11/2013.  cycle 7 adjuvant Xeloda 10/02/2013  Cycle 8 adjuvant Xeloda 10/23/2013  CTs of the chest, abdomen, and pelvis on 07/18/2014-negative for recurrent colon cancer   CTs abdomen/pelvis 09/06/2014 showed acute cholecystitis.  Colonoscopy 08/02/2015-chronic colitis, no malignancy  CTs 09/17/2015, new mass near the tail the pancreas, stable lung nodules  Biopsy mesenteric mass 10/04/2015 with  metastatic adenocarcinoma consistent with colorectal primary  PET scan 10/10/2015 with soft tissue thickening within the peritoneal space of the left upper quadrant with SUV Max 7.9; no additional sites of peritoneal nodularity to suggest metastasis; hypermetabolic ill-defined nodule in the left lower lobe favored inflammatory. 2. Ulcerative colitis-extensive chronic active ulcerative colitis was noted on the colon resection specimen 03/28/2013.  Followed by Dr.Magod 3. Hypertension.  4. History of microcytic anemia-likely iron deficiency, unable to tolerate oral iron. Improved. 5. Possible area of cecal wall thickening noted on abdominal CT 03/20/2013. 6. Family history of colon cancer (maternal grandfather died in his 64s with colon cancer). 7. Bilateral calf and low anterior leg pain. Bilateral lower extremity venous duplex negative for DVT 07/14/2013. Right ABI with moderate, borderline severe arterial insufficiency; left ABI suggestive of moderate arterial insufficiency. He was referred to vascular. 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 TEE showed thrombus in the descending aorta. He underwent right popliteal to peroneal artery bypass graft, thrombectomy anterior tibialis and attempted thrombectomy tibioperoneal trunk and posterior tibialis on 08/11/2013. Right calf pain 09/26/2013 with findings of occlusion of right popliteal to peroneal bypass status post thrombolysis. Now on anticoagulation with Xarelto.  recurrent pain and ulceration at the right foot, status post a popliteal to posterior tibial artery bypass using a cephalic vein graft on 94/85/4627 8. Status post laparoscopic cholecystectomy 09/08/2014   Disposition: Cody Suarez appears stable. Dr. Benay Spice reviewed the recent biopsy and PET scan results with him at today's visit and discussed options to include a referral to Dr. Hassell Done to consider surgical resection versus observation with a repeat CT scan at  a four-month interval. Cody Suarez would like  to discuss the possibility of surgery with Dr. Hassell Done. We will contact Dr. Earlie Server office to arrange for an appointment.  We scheduled a return visit here in approximately 5 weeks.  Patient seen with Dr. Benay Spice.  Ned Card ANP/GNP-BC   10/15/2015  3:21 PM   This was a shared visit with Ned Card. We discussed the biopsy and PET findings with Cody Suarez. We will refer him to Dr. Hassell Done to consider resection of the mesenteric metastasis.  Julieanne Manson, M.D.

## 2015-10-15 NOTE — Telephone Encounter (Signed)
Gave patient avs report and appointments for November. Spoke with Abigail Butts, RN at [facility] re appointment with Dr. Hassell Done (forward to triage by front office). Per Abigail Butts she will review Dr. Earlie Server schedule with his assistant to find a place to work patient in. Per Abigail Butts they will call patient directly with appointment. Patient aware.

## 2015-10-15 NOTE — Progress Notes (Signed)
Oncology Nurse Navigator Documentation  Oncology Nurse Navigator Flowsheets 10/15/2015  Navigator Location [facility] On[facility]  Navigator Encounter Type Follow-up Appt  Telephone -  Patient Visit Type MedOnc  Treatment Phase Other  Barriers/Navigation Needs Coordination of Care--appointment with Dr. Hassell Done at [facility]  Education -  Interventions Coordination of Care--inbasket message to Dr. Earlie Server nurse requesting appointment next week to discuss surgery.  Coordination of Care Appts  Acuity -  Time Spent with Patient 15  Time Spent with Patient (Retired) -

## 2015-10-18 ENCOUNTER — Ambulatory Visit: Payer: Self-pay | Admitting: Surgery

## 2015-10-18 NOTE — H&P (Signed)
Chief Complaint:  Recurrent cancer in the left upper quadrant  History of Present Illness:  Cody Suarez is an 70 y.o. male who underwent transverse colectomy in 2015 at which time omental extension was noted.  He has developed an isolated met that appears to be slightly inferior to the tip of the pancreas.  This may be in the root of the mesentery and I may need to get my hands on it to localize it and resect it safely.  Past Medical History:  Diagnosis Date  . Anemia of chronic disease 2015   "related to cancer tx" (08/09/2013)  . Arthritis    "thumb joints" (08/09/2013)  . Colon cancer ([place]) 03/2013  . Diarrhea   . Hypertension   . Kidney stones X 2   "passed them both"  . Peripheral vascular disease ([place])   . Ulcerative colitis ([place])    history    Past Surgical History:  Procedure Laterality Date  . ABDOMINAL AORTAGRAM N/A 08/09/2013   Procedure: ABDOMINAL Maxcine Ham;  Surgeon: [name], MD;  Location: [facility] CATH LAB;  Service: Cardiovascular;  Laterality: N/A;  . CHOLECYSTECTOMY N/A 09/08/2014   Procedure: LAPAROSCOPIC CHOLECYSTECTOMY WITH INTRAOPERATIVE CHOLANGIOGRAM;  Surgeon: Armandina Gemma, MD;  [facility];  Service: General;  Laterality: N/A;  . COLON SURGERY    . FEMORAL-POPLITEAL BYPASS GRAFT Right 08/11/2013   Procedure:   RIGHT - POPLITEAL TO PERONEAL ARTERY BYPASS GRAFT  WITH NONREVERSED SAPHENOUS VEIN GRAFT,tHROMBECTOMY ANTERIOR TIBIALIS,ATTEMPTED THROMBECTOMY TIBIO-PERONEAL TRUNK AND POSTERIOR TIBIALIS, INTRAOPERATIVE ARTERIOGRAM.;  Surgeon: Mal Misty, MD;  Location: [facility];  Service: Vascular;  Laterality: Right;  . FEMORAL-TIBIAL BYPASS GRAFT Right 11/17/2013   Procedure: BYPASS GRAFT RIGHT ABOVE KNEE POPLITEAL TO POSTERIOR TIBIAL ARTERY USING RIGHT NON-REVERSED CEPHALIC VEIN;  Surgeon: Mal Misty, MD;  Location: [facility];  Service: Vascular;  Laterality: Right;  . HERNIA REPAIR  ~ 1995; 03/2013   UHR  . INTRAOPERATIVE ARTERIOGRAM Right 11/17/2013   Procedure:  INTRA OPERATIVE ARTERIOGRAM;  Surgeon: Mal Misty, MD;  Location: [facility];  Service: Vascular;  Laterality: Right;  . LAPAROSCOPIC RIGHT HEMI COLECTOMY N/A 03/28/2013   Procedure: LAPAROSCOPIC ASSISTED HEMI COLECTOMY;  Surgeon: Pedro Earls, MD;  [facility];  Service: General;  Laterality: N/A;  . LOWER EXTREMITY ANGIOGRAM Bilateral 08/09/2013  . TEE WITHOUT CARDIOVERSION N/A 08/10/2013   Procedure: TRANSESOPHAGEAL ECHOCARDIOGRAM (TEE);  Surgeon: Candee Furbish, MD;  Location: [facility] ENDOSCOPY;  Service: Cardiovascular;  Laterality: N/A;  . TONSILLECTOMY  ~ 1950  . [place]; 03/2013  . VASECTOMY    . VEIN HARVEST Right 11/17/2013   Procedure: HARVEST OF RIGHT UPPER EXTREMITY CEPHALIC VEIN;  Surgeon: Mal Misty, MD;  Location: [facility];  Service: Vascular;  Laterality: Right;    Current Outpatient Prescriptions  Medication Sig Dispense Refill  . budesonide (ENTOCORT EC) 3 MG 24 hr capsule Take 3 mg by mouth 2 (two) times daily.    . carvedilol (COREG) 6.25 MG tablet Take 1 tablet (6.25 mg total) by mouth 2 (two) times daily. 60 tablet 6  . diphenhydramine-acetaminophen (TYLENOL PM) 25-500 MG TABS Take 1 tablet by mouth at bedtime as needed. Reported on 01/22/2015    . losartan (COZAAR) 100 MG tablet Take 1 tablet (100 mg total) by mouth daily. 90 tablet 3  . spironolactone (ALDACTONE) 25 MG tablet TAKE 1 TABLET DAILY 30 tablet 11  . VIAGRA 50 MG tablet Take 50 mg by mouth daily as needed.  5  . XARELTO 20 MG TABS tablet TAKE 1 TABLET DAILY WITH BREAKFAST 30 tablet 3   No current facility-administered medications for this visit.    Amlodipine and Gabapentin Family History  Problem Relation Age of Onset  . Heart disease Mother   . Emphysema Father    Social History:   reports that he has never smoked. He has never used smokeless tobacco. He reports that he drinks about 1.8 oz of alcohol per week . He reports that he does not use drugs.   REVIEW OF SYSTEMS  : Negative except for see problem list  Physical Exam:   There were no vitals taken for this visit. There is no height or weight on file to calculate BMI.  Gen:  WDWN WM NAD  Neurological: Alert and oriented to person, place, and time. Motor and sensory function is grossly intact  Head: Normocephalic and atraumatic.  Eyes: Conjunctivae are normal. Pupils are equal, round, and reactive to light. No scleral icterus.  Neck: Normal range of motion. Neck supple. No tracheal deviation or thyromegaly present.  Cardiovascular:  SR without murmurs or gallops.  No carotid bruits Breast:  Not examined Respiratory: Effort normal.  No respiratory distress. No chest wall tenderness. Breath sounds normal.  No wheezes, rales or rhonchi.  Abdomen:  Hernia in midline incision GU:  Not examined Musculoskeletal: Normal range of motion. Extremities are nontender. No cyanosis, edema or clubbing noted Lymphadenopathy: No cervical, preauricular, postauricular or axillary adenopathy is present Skin: Skin is warm and dry. No rash noted. No diaphoresis. No erythema. No pallor. Pscyh: Normal mood and affect. Behavior is normal. Judgment and thought content normal.   LABORATORY RESULTS: No results found for this or any previous visit (from the past 48 hour(s)).   RADIOLOGY RESULTS: No results found.  Problem List: Patient Active Problem List   Diagnosis Date Noted  . Anemia of chronic disease 09/06/2014  . 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 Atherosclerotic PVD with intermittent claudication ([place]) 10/31/2013  . 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 Hypertension   . Arterial thromboembolism ([place]) 08/09/2013  . 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 Right inguinal hernia 03/23/2013  . Umbilical hernia 12/28/8248  . Ulcerative colitis ([place]) 03/23/2013     Assessment & Plan: Recurrent isolated mass in the left upper quadrant.      Matt B. Hassell Done, MD, [facility] [phone] beeper [phone]  10/18/2015 4:03 PM

## 2015-10-31 NOTE — Patient Instructions (Addendum)
Cody Suarez  10/31/2015   Your procedure is scheduled on: 11-05-15  Report to [facility] Main  Entrance take [facility]  elevators to 3rd floor to  [place] at  515 AM  Call this number if you have problems the morning of surgery [phone]   Remember: ONLY 1 PERSON MAY GO WITH YOU TO SHORT STAY TO GET  READY MORNING OF YOUR SURGERY.  Do not eat food or drink liquids :After Midnight.     Take these medicines the morning of surgery with A SIP OF WATER: carvedilol              You may not have any metal on your body including hair pins and              piercings  Do not wear jewelry, make-up, lotions, powders or perfumes, deodorant             Do not wear nail polish.  Do not shave  48 hours prior to surgery.              Men may shave face and neck.   Do not bring valuables to the hospital. [place].  Contacts, dentures or bridgework may not be worn into surgery.  Leave suitcase in the car. After surgery it may be brought to your room.                  Please read over the following fact sheets you were given: _____________________________________________________________________             [facility] - Preparing for Surgery Before surgery, you can play an important role.  Because skin is not sterile, your skin needs to be as free of germs as possible.  You can reduce the number of germs on your skin by washing with CHG (chlorahexidine gluconate) soap before surgery.  CHG is an antiseptic cleaner which kills germs and bonds with the skin to continue killing germs even after washing. Please DO NOT use if you have an allergy to CHG or antibacterial soaps.  If your skin becomes reddened/irritated stop using the CHG and inform your nurse when you arrive at Short Stay. Do not shave (including legs and underarms) for at least 48 hours prior to the first CHG shower.  You may shave your face/neck. Please  follow these instructions carefully:  1.  Shower with CHG Soap the night before surgery and the  morning of Surgery.  2.  If you choose to wash your hair, wash your hair first as usual with your  normal  shampoo.  3.  After you shampoo, rinse your hair and body thoroughly to remove the  shampoo.                           4.  Use CHG as you would any other liquid soap.  You can apply chg directly  to the skin and wash                       Gently with a scrungie or clean washcloth.  5.  Apply the CHG Soap to your body ONLY FROM THE NECK DOWN.   Do not use on face/ open  Wound or open sores. Avoid contact with eyes, ears mouth and genitals (private parts).                       Wash face,  Genitals (private parts) with your normal soap.             6.  Wash thoroughly, paying special attention to the area where your surgery  will be performed.  7.  Thoroughly rinse your body with warm water from the neck down.  8.  DO NOT shower/wash with your normal soap after using and rinsing off  the CHG Soap.                9.  Pat yourself dry with a clean towel.            10.  Wear clean pajamas.            11.  Place clean sheets on your bed the night of your first shower and do not  sleep with pets. Day of Surgery : Do not apply any lotions/deodorants the morning of surgery.  Please wear clean clothes to the hospital/surgery center.  FAILURE TO FOLLOW THESE INSTRUCTIONS MAY RESULT IN THE CANCELLATION OF YOUR SURGERY PATIENT SIGNATURE_________________________________  NURSE SIGNATURE__________________________________  ________________________________________________________________________

## 2015-10-31 NOTE — Progress Notes (Signed)
10-09-15 heamglobin A!C, cbc with dif, hepatic function, bmet, lipid panel Chest ct 09-17-15 epic ekg 08-01-15 epic Echo 09-08-14

## 2015-11-01 ENCOUNTER — Encounter (HOSPITAL_COMMUNITY)
Admission: RE | Admit: 2015-11-01 | Discharge: 2015-11-01 | Disposition: A | Discharge status: Home / Self Care | Payer: 59 | Source: Ambulatory Visit | Attending: Surgery | Admitting: Surgery

## 2015-11-01 ENCOUNTER — Encounter (HOSPITAL_COMMUNITY): Payer: Self-pay

## 2015-11-01 HISTORY — DX: Acute embolism and thrombosis of unspecified vein: I82.90

## 2015-11-05 ENCOUNTER — Encounter (HOSPITAL_COMMUNITY)
Admission: RE | Disposition: A | Discharge status: Home / Self Care | Payer: Self-pay | Source: Ambulatory Visit | Attending: Surgery

## 2015-11-05 ENCOUNTER — Inpatient Hospital Stay (HOSPITAL_COMMUNITY): Payer: 59 | Admitting: Certified Registered"

## 2015-11-05 ENCOUNTER — Inpatient Hospital Stay (HOSPITAL_COMMUNITY)
Admission: RE | Admit: 2015-11-05 | Discharge: 2015-11-11 | DRG: 336 | Disposition: A | Discharge status: Home / Self Care | Payer: 59 | Source: Ambulatory Visit | Attending: Surgery | Admitting: Surgery

## 2015-11-05 ENCOUNTER — Encounter (HOSPITAL_COMMUNITY): Payer: Self-pay | Admitting: *Deleted

## 2015-11-05 DIAGNOSIS — K660 Peritoneal adhesions (postprocedural) (postinfection): Secondary | ICD-10-CM | POA: Diagnosis present

## 2015-11-05 DIAGNOSIS — C786 Secondary malignant neoplasm of retroperitoneum and peritoneum: Secondary | ICD-10-CM | POA: Diagnosis present

## 2015-11-05 DIAGNOSIS — R0989 Other specified symptoms and signs involving the circulatory and respiratory systems: Secondary | ICD-10-CM

## 2015-11-05 DIAGNOSIS — Z87442 Personal history of urinary calculi: Secondary | ICD-10-CM | POA: Diagnosis not present

## 2015-11-05 DIAGNOSIS — C19 Malignant neoplasm of rectosigmoid junction: Secondary | ICD-10-CM | POA: Diagnosis present

## 2015-11-05 DIAGNOSIS — Z9889 Other specified postprocedural states: Secondary | ICD-10-CM

## 2015-11-05 DIAGNOSIS — Z79899 Other long term (current) drug therapy: Secondary | ICD-10-CM

## 2015-11-05 DIAGNOSIS — I739 Peripheral vascular disease, unspecified: Secondary | ICD-10-CM | POA: Diagnosis present

## 2015-11-05 DIAGNOSIS — Z8249 Family history of ischemic heart disease and other diseases of the circulatory system: Secondary | ICD-10-CM | POA: Diagnosis not present

## 2015-11-05 DIAGNOSIS — Z7902 Long term (current) use of antithrombotics/antiplatelets: Secondary | ICD-10-CM

## 2015-11-05 DIAGNOSIS — Z9049 Acquired absence of other specified parts of digestive tract: Secondary | ICD-10-CM

## 2015-11-05 DIAGNOSIS — I10 Essential (primary) hypertension: Secondary | ICD-10-CM | POA: Diagnosis present

## 2015-11-05 HISTORY — PX: LAPAROSCOPY: SHX197

## 2015-11-05 HISTORY — PX: LAPAROTOMY: SHX154

## 2015-11-05 LAB — BASIC METABOLIC PANEL
ANION GAP: 9 (ref 5–15)
BUN: 15 mg/dL (ref 6–20)
CALCIUM: 8 mg/dL — AB (ref 8.9–10.3)
CO2: 24 mmol/L (ref 22–32)
Chloride: 104 mmol/L (ref 101–111)
Creatinine, Ser: 1.37 mg/dL — ABNORMAL HIGH (ref 0.61–1.24)
GFR, EST AFRICAN AMERICAN: 59 mL/min — AB (ref >60)
GFR, EST NON AFRICAN AMERICAN: 51 mL/min — AB (ref >60)
GLUCOSE: 203 mg/dL — AB (ref 65–99)
POTASSIUM: 5.1 mmol/L (ref 3.5–5.1)
Sodium: 137 mmol/L (ref 135–145)

## 2015-11-05 LAB — CBC
HEMATOCRIT: 46.6 % (ref 39.0–52.0)
Hemoglobin: 15.8 g/dL (ref 13.0–17.0)
MCH: 30.6 pg (ref 26.0–34.0)
MCHC: 33.9 g/dL (ref 30.0–36.0)
MCV: 90.3 fL (ref 78.0–100.0)
PLATELETS: 250 10*3/uL (ref 150–400)
RBC: 5.16 MIL/uL (ref 4.22–5.81)
RDW: 14.8 % (ref 11.5–15.5)
WBC: 21.5 10*3/uL — AB (ref 4.0–10.5)

## 2015-11-05 LAB — LIPASE, BLOOD: LIPASE: 111 U/L — AB (ref 11–51)

## 2015-11-05 LAB — PROTIME-INR
INR: 1.04
PROTHROMBIN TIME: 13.6 s (ref 11.4–15.2)

## 2015-11-05 LAB — PREPARE RBC (CROSSMATCH)

## 2015-11-05 LAB — MRSA PCR SCREENING: MRSA by PCR: NEGATIVE

## 2015-11-05 SURGERY — LAPAROSCOPY, DIAGNOSTIC
Anesthesia: General

## 2015-11-05 MED ORDER — MORPHINE SULFATE 2 MG/ML IV SOLN
INTRAVENOUS | Status: DC
  Administered 2015-11-06: 1.5 mg via INTRAVENOUS
  Administered 2015-11-06: 0 mg via INTRAVENOUS
  Administered 2015-11-06: 10.5 mg via INTRAVENOUS
  Administered 2015-11-06: 2.5 mg via INTRAVENOUS
  Administered 2015-11-06 – 2015-11-07 (×2): 1.5 mg via INTRAVENOUS
  Administered 2015-11-07: 18:00:00 via INTRAVENOUS
  Administered 2015-11-07: 3 mg via INTRAVENOUS
  Administered 2015-11-07: 4.5 mg via INTRAVENOUS
  Administered 2015-11-07: 3 mg via INTRAVENOUS
  Administered 2015-11-07: 4.5 mg via INTRAVENOUS
  Administered 2015-11-07: 3 mg via INTRAVENOUS
  Administered 2015-11-08: 0 mg via INTRAVENOUS
  Administered 2015-11-08 (×3): 4.5 mg via INTRAVENOUS
  Administered 2015-11-09: 3 mg via INTRAVENOUS
  Administered 2015-11-09: 0 mg via INTRAVENOUS
  Administered 2015-11-09 – 2015-11-10 (×4): 1.5 mg via INTRAVENOUS
  Administered 2015-11-10: 0 mg via INTRAVENOUS
  Filled 2015-11-05 (×2): qty 25

## 2015-11-05 MED ORDER — PROMETHAZINE HCL 25 MG/ML IJ SOLN: 6.2500 mg | INTRAMUSCULAR | Status: DC | PRN

## 2015-11-05 MED ORDER — FENTANYL CITRATE (PF) 100 MCG/2ML IJ SOLN
INTRAMUSCULAR | Status: AC
  Filled 2015-11-05: qty 2

## 2015-11-05 MED ORDER — CHLORHEXIDINE GLUCONATE CLOTH 2 % EX PADS: 6.0000 | MEDICATED_PAD | Freq: Once | CUTANEOUS | Status: DC

## 2015-11-05 MED ORDER — HEPARIN SODIUM (PORCINE) 5000 UNIT/ML IJ SOLN
5000.0000 [IU] | Freq: Three times a day (TID) | INTRAMUSCULAR | Status: DC
  Administered 2015-11-06 – 2015-11-11 (×14): 5000 [IU] via SUBCUTANEOUS
  Filled 2015-11-05 (×14): qty 1

## 2015-11-05 MED ORDER — CEFOTETAN DISODIUM-DEXTROSE 2-2.08 GM-% IV SOLR
2.0000 g | INTRAVENOUS | Status: AC
  Administered 2015-11-05: 2 g via INTRAVENOUS

## 2015-11-05 MED ORDER — ALBUMIN HUMAN 5 % IV SOLN
INTRAVENOUS | Status: AC
  Filled 2015-11-05: qty 250

## 2015-11-05 MED ORDER — ROCURONIUM BROMIDE 50 MG/5ML IV SOSY
PREFILLED_SYRINGE | INTRAVENOUS | Status: AC
  Filled 2015-11-05: qty 5

## 2015-11-05 MED ORDER — MORPHINE SULFATE 2 MG/ML IV SOLN
INTRAVENOUS | Status: DC
  Administered 2015-11-05: 2 mg via INTRAVENOUS
  Administered 2015-11-05: 5 mg via INTRAVENOUS
  Administered 2015-11-05: 17:00:00 via INTRAVENOUS
  Filled 2015-11-05: qty 25

## 2015-11-05 MED ORDER — MORPHINE SULFATE (PF) 2 MG/ML IV SOLN
1.0000 mg | INTRAVENOUS | Status: DC | PRN
  Administered 2015-11-05 (×3): 1 mg via INTRAVENOUS
  Filled 2015-11-05 (×3): qty 1

## 2015-11-05 MED ORDER — FENTANYL CITRATE (PF) 250 MCG/5ML IJ SOLN
INTRAMUSCULAR | Status: AC
  Filled 2015-11-05: qty 5

## 2015-11-05 MED ORDER — DIPHENHYDRAMINE HCL 12.5 MG/5ML PO ELIX: 12.5000 mg | ORAL_SOLUTION | Freq: Four times a day (QID) | ORAL | Status: DC | PRN

## 2015-11-05 MED ORDER — ONDANSETRON 4 MG PO TBDP: 4.0000 mg | ORAL_TABLET | Freq: Four times a day (QID) | ORAL | Status: DC | PRN

## 2015-11-05 MED ORDER — CEFAZOLIN SODIUM-DEXTROSE 2-4 GM/100ML-% IV SOLN
2.0000 g | Freq: Three times a day (TID) | INTRAVENOUS | Status: AC
  Administered 2015-11-05: 2 g via INTRAVENOUS
  Filled 2015-11-05 (×2): qty 100

## 2015-11-05 MED ORDER — SODIUM CHLORIDE 0.9% FLUSH: 9.0000 mL | INTRAVENOUS | Status: DC | PRN

## 2015-11-05 MED ORDER — MIDAZOLAM HCL 2 MG/2ML IJ SOLN
INTRAMUSCULAR | Status: AC
  Filled 2015-11-05: qty 2

## 2015-11-05 MED ORDER — HYDROMORPHONE HCL 1 MG/ML IJ SOLN
0.2500 mg | INTRAMUSCULAR | Status: DC | PRN
  Administered 2015-11-05: 0.5 mg via INTRAVENOUS

## 2015-11-05 MED ORDER — ONDANSETRON HCL 4 MG/2ML IJ SOLN: 4.0000 mg | Freq: Four times a day (QID) | INTRAMUSCULAR | Status: DC | PRN

## 2015-11-05 MED ORDER — SODIUM CHLORIDE 0.9 % IV SOLN
INTRAVENOUS | Status: DC | PRN
  Administered 2015-11-05: 11:00:00 via INTRAVENOUS

## 2015-11-05 MED ORDER — DEXAMETHASONE SODIUM PHOSPHATE 10 MG/ML IJ SOLN
INTRAMUSCULAR | Status: DC | PRN
  Administered 2015-11-05: 10 mg via INTRAVENOUS

## 2015-11-05 MED ORDER — ACETAMINOPHEN 500 MG PO TABS
1000.0000 mg | ORAL_TABLET | ORAL | Status: AC
  Administered 2015-11-05: 1000 mg via ORAL
  Filled 2015-11-05: qty 2

## 2015-11-05 MED ORDER — 0.9 % SODIUM CHLORIDE (POUR BTL) OPTIME
TOPICAL | Status: DC | PRN
  Administered 2015-11-05: 2000 mL

## 2015-11-05 MED ORDER — MIDAZOLAM HCL 2 MG/2ML IJ SOLN
INTRAMUSCULAR | Status: DC | PRN
  Administered 2015-11-05: 2 mg via INTRAVENOUS

## 2015-11-05 MED ORDER — PROPOFOL 10 MG/ML IV BOLUS
INTRAVENOUS | Status: AC
  Filled 2015-11-05: qty 40

## 2015-11-05 MED ORDER — BUPIVACAINE HCL (PF) 0.5 % IJ SOLN
INTRAMUSCULAR | Status: AC
  Filled 2015-11-05: qty 30

## 2015-11-05 MED ORDER — PHENYLEPHRINE 40 MCG/ML (10ML) SYRINGE FOR IV PUSH (FOR BLOOD PRESSURE SUPPORT)
PREFILLED_SYRINGE | INTRAVENOUS | Status: AC
  Filled 2015-11-05: qty 10

## 2015-11-05 MED ORDER — SUGAMMADEX SODIUM 200 MG/2ML IV SOLN
INTRAVENOUS | Status: DC | PRN
  Administered 2015-11-05: 180 mg via INTRAVENOUS

## 2015-11-05 MED ORDER — ONDANSETRON HCL 4 MG/2ML IJ SOLN
INTRAMUSCULAR | Status: DC | PRN
  Administered 2015-11-05: 4 mg via INTRAVENOUS

## 2015-11-05 MED ORDER — NALOXONE HCL 0.4 MG/ML IJ SOLN: 0.4000 mg | INTRAMUSCULAR | Status: DC | PRN

## 2015-11-05 MED ORDER — ONDANSETRON HCL 4 MG/2ML IJ SOLN
4.0000 mg | Freq: Four times a day (QID) | INTRAMUSCULAR | Status: DC | PRN
  Administered 2015-11-06 – 2015-11-09 (×9): 4 mg via INTRAVENOUS
  Filled 2015-11-05 (×2): qty 2

## 2015-11-05 MED ORDER — CEFOTETAN DISODIUM-DEXTROSE 2-2.08 GM-% IV SOLR
INTRAVENOUS | Status: AC
  Filled 2015-11-05: qty 50

## 2015-11-05 MED ORDER — ONDANSETRON HCL 4 MG/2ML IJ SOLN
4.0000 mg | Freq: Four times a day (QID) | INTRAMUSCULAR | Status: DC | PRN
  Filled 2015-11-05 (×9): qty 2

## 2015-11-05 MED ORDER — HYDROMORPHONE HCL 1 MG/ML IJ SOLN
INTRAMUSCULAR | Status: AC
  Filled 2015-11-05: qty 1

## 2015-11-05 MED ORDER — GABAPENTIN 300 MG PO CAPS
300.0000 mg | ORAL_CAPSULE | ORAL | Status: DC
  Filled 2015-11-05: qty 1

## 2015-11-05 MED ORDER — FENTANYL CITRATE (PF) 250 MCG/5ML IJ SOLN
INTRAMUSCULAR | Status: DC | PRN
  Administered 2015-11-05 (×4): 50 ug via INTRAVENOUS
  Administered 2015-11-05: 100 ug via INTRAVENOUS
  Administered 2015-11-05: 50 ug via INTRAVENOUS

## 2015-11-05 MED ORDER — LIDOCAINE HCL (CARDIAC) 20 MG/ML IV SOLN
INTRAVENOUS | Status: DC | PRN
  Administered 2015-11-05: 35 mg via INTRATRACHEAL

## 2015-11-05 MED ORDER — CELECOXIB 200 MG PO CAPS
400.0000 mg | ORAL_CAPSULE | ORAL | Status: AC
  Administered 2015-11-05: 400 mg via ORAL
  Filled 2015-11-05: qty 2

## 2015-11-05 MED ORDER — ONDANSETRON HCL 4 MG/2ML IJ SOLN
INTRAMUSCULAR | Status: AC
Start: 2015-11-05 — End: 2015-11-05
  Filled 2015-11-05: qty 2

## 2015-11-05 MED ORDER — MORPHINE SULFATE (PF) 2 MG/ML IV SOLN
2.0000 mg | Freq: Once | INTRAVENOUS | Status: AC
  Administered 2015-11-05: 2 mg via INTRAVENOUS
  Filled 2015-11-05: qty 1

## 2015-11-05 MED ORDER — SUCCINYLCHOLINE CHLORIDE 20 MG/ML IJ SOLN
INTRAMUSCULAR | Status: AC
  Filled 2015-11-05: qty 1

## 2015-11-05 MED ORDER — FAMOTIDINE IN NACL 20-0.9 MG/50ML-% IV SOLN
20.0000 mg | Freq: Two times a day (BID) | INTRAVENOUS | Status: DC
  Administered 2015-11-05 – 2015-11-10 (×12): 20 mg via INTRAVENOUS
  Filled 2015-11-05 (×12): qty 50

## 2015-11-05 MED ORDER — DIPHENHYDRAMINE HCL 50 MG/ML IJ SOLN: 12.5000 mg | Freq: Four times a day (QID) | INTRAMUSCULAR | Status: DC | PRN

## 2015-11-05 MED ORDER — KCL IN DEXTROSE-NACL 20-5-0.45 MEQ/L-%-% IV SOLN
INTRAVENOUS | Status: DC
  Administered 2015-11-05 – 2015-11-06 (×2): via INTRAVENOUS
  Filled 2015-11-05 (×2): qty 1000

## 2015-11-05 MED ORDER — PHENYLEPHRINE HCL 10 MG/ML IJ SOLN
INTRAMUSCULAR | Status: DC | PRN
  Administered 2015-11-05 (×4): 80 ug via INTRAVENOUS

## 2015-11-05 MED ORDER — ROCURONIUM BROMIDE 100 MG/10ML IV SOLN
INTRAVENOUS | Status: DC | PRN
  Administered 2015-11-05 (×2): 10 mg via INTRAVENOUS
  Administered 2015-11-05: 40 mg via INTRAVENOUS
  Administered 2015-11-05: 20 mg via INTRAVENOUS
  Administered 2015-11-05: 10 mg via INTRAVENOUS

## 2015-11-05 MED ORDER — HEPARIN SODIUM (PORCINE) 5000 UNIT/ML IJ SOLN
5000.0000 [IU] | Freq: Once | INTRAMUSCULAR | Status: AC
  Administered 2015-11-05: 5000 [IU] via SUBCUTANEOUS
  Filled 2015-11-05: qty 1

## 2015-11-05 MED ORDER — LIDOCAINE 2% (20 MG/ML) 5 ML SYRINGE
INTRAMUSCULAR | Status: AC
  Filled 2015-11-05: qty 5

## 2015-11-05 MED ORDER — DEXAMETHASONE SODIUM PHOSPHATE 10 MG/ML IJ SOLN
INTRAMUSCULAR | Status: AC
  Filled 2015-11-05: qty 1

## 2015-11-05 MED ORDER — LACTATED RINGERS IV SOLN
INTRAVENOUS | Status: DC | PRN
  Administered 2015-11-05 (×2): via INTRAVENOUS

## 2015-11-05 MED ORDER — PROPOFOL 10 MG/ML IV BOLUS
INTRAVENOUS | Status: DC | PRN
  Administered 2015-11-05: 150 mg via INTRAVENOUS

## 2015-11-05 SURGICAL SUPPLY — 66 items
APPLICATOR ARISTA FLEXITIP XL (MISCELLANEOUS) ×2 IMPLANT
APPLICATOR COTTON TIP 6IN STRL (MISCELLANEOUS) IMPLANT
BENZOIN TINCTURE PRP APPL 2/3 (GAUZE/BANDAGES/DRESSINGS) IMPLANT
BLADE EXTENDED COATED 6.5IN (ELECTRODE) IMPLANT
BLADE HEX COATED 2.75 (ELECTRODE) ×2 IMPLANT
CHLORAPREP W/TINT 26ML (MISCELLANEOUS) ×2 IMPLANT
CLIP TI LARGE 6 (CLIP) ×2 IMPLANT
COVER MAYO STAND STRL (DRAPES) ×2 IMPLANT
COVER SURGICAL LIGHT HANDLE (MISCELLANEOUS) ×2 IMPLANT
DECANTER SPIKE VIAL GLASS SM (MISCELLANEOUS) IMPLANT
DRAIN CHANNEL 19F RND (DRAIN) IMPLANT
DRAPE LAPAROSCOPIC ABDOMINAL (DRAPES) ×2 IMPLANT
DRAPE WARM FLUID 44X44 (DRAPE) ×2 IMPLANT
DRSG OPSITE POSTOP 4X10 (GAUZE/BANDAGES/DRESSINGS) ×2 IMPLANT
ELECT REM PT RETURN 9FT ADLT (ELECTROSURGICAL) ×2
ELECTRODE REM PT RTRN 9FT ADLT (ELECTROSURGICAL) ×1 IMPLANT
EVACUATOR SILICONE 100CC (DRAIN) IMPLANT
GAUZE SPONGE 4X4 12PLY STRL (GAUZE/BANDAGES/DRESSINGS) ×2 IMPLANT
GLOVE BIOGEL M 8.0 STRL (GLOVE) ×2 IMPLANT
GLOVE BIOGEL PI IND STRL 7.0 (GLOVE) ×1 IMPLANT
GLOVE BIOGEL PI INDICATOR 7.0 (GLOVE) ×1
GOWN SPEC L4 XLG W/TWL (GOWN DISPOSABLE) IMPLANT
GOWN STRL REUS W/TWL LRG LVL3 (GOWN DISPOSABLE) ×2 IMPLANT
GOWN STRL REUS W/TWL XL LVL3 (GOWN DISPOSABLE) ×8 IMPLANT
HANDLE STAPLE EGIA 4 XL (STAPLE) ×2 IMPLANT
HANDLE SUCTION POOLE (INSTRUMENTS) ×1 IMPLANT
HEMOSTAT ARISTA ABSORB 3G PWDR (MISCELLANEOUS) ×2 IMPLANT
IRRIG SUCT STRYKERFLOW 2 WTIP (MISCELLANEOUS)
IRRIGATION SUCT STRKRFLW 2 WTP (MISCELLANEOUS) IMPLANT
KIT BASIN OR (CUSTOM PROCEDURE TRAY) ×2 IMPLANT
NS IRRIG 1000ML POUR BTL (IV SOLUTION) ×4 IMPLANT
PACK GENERAL/GYN (CUSTOM PROCEDURE TRAY) ×2 IMPLANT
RELOAD TRI 60 ART MED THCK BLK (STAPLE) ×4 IMPLANT
RETRACTOR WND ALEXIS 25 LRG (MISCELLANEOUS) ×1 IMPLANT
RTRCTR WOUND ALEXIS 25CM LRG (MISCELLANEOUS) ×2
SEALER TISSUE X1 CVD JAW (INSTRUMENTS) IMPLANT
SHEARS FOC LG CVD HARMONIC 17C (MISCELLANEOUS) ×2 IMPLANT
SHEARS HARMONIC ACE PLUS 36CM (ENDOMECHANICALS) ×2 IMPLANT
SOLUTION ANTI FOG 6CC (MISCELLANEOUS) ×2 IMPLANT
SPONGE LAP 18X18 X RAY DECT (DISPOSABLE) ×6 IMPLANT
STAPLER VISISTAT 35W (STAPLE) ×2 IMPLANT
STRIP CLOSURE SKIN 1/2X4 (GAUZE/BANDAGES/DRESSINGS) IMPLANT
SUCTION POOLE HANDLE (INSTRUMENTS) ×2
SUT ETHILON 3 0 PS 1 (SUTURE) ×2 IMPLANT
SUT PDS AB 1 CTX 36 (SUTURE) IMPLANT
SUT PDS AB 1 TP1 54 (SUTURE) ×4 IMPLANT
SUT SILK 2 0 (SUTURE) ×1
SUT SILK 2 0 SH CR/8 (SUTURE) ×2 IMPLANT
SUT SILK 2-0 18XBRD TIE 12 (SUTURE) ×1 IMPLANT
SUT SILK 3 0 (SUTURE) ×1
SUT SILK 3 0 SH CR/8 (SUTURE) ×2 IMPLANT
SUT SILK 3-0 18XBRD TIE 12 (SUTURE) ×1 IMPLANT
SUT VIC AB 3-0 SH 18 (SUTURE) IMPLANT
SUT VIC AB 4-0 SH 18 (SUTURE) IMPLANT
SUT VICRYL 2 0 18  UND BR (SUTURE)
SUT VICRYL 2 0 18 UND BR (SUTURE) IMPLANT
SYR 30ML LL (SYRINGE) ×2 IMPLANT
TAPE CLOTH SURG 4X10 WHT LF (GAUZE/BANDAGES/DRESSINGS) ×2 IMPLANT
TOWEL OR 17X26 10 PK STRL BLUE (TOWEL DISPOSABLE) ×2 IMPLANT
TOWEL OR NON WOVEN STRL DISP B (DISPOSABLE) ×2 IMPLANT
TRAY FOLEY W/METER SILVER 16FR (SET/KITS/TRAYS/PACK) ×2 IMPLANT
TRAY LAPAROSCOPIC (CUSTOM PROCEDURE TRAY) ×2 IMPLANT
TROCAR BLADELESS OPT 5 100 (ENDOMECHANICALS) ×2 IMPLANT
TROCAR XCEL NON-BLD 11X100MML (ENDOMECHANICALS) IMPLANT
TROCAR XCEL UNIV SLVE 11M 100M (ENDOMECHANICALS) IMPLANT
TUBING INSUF HEATED (TUBING) ×2 IMPLANT

## 2015-11-05 NOTE — Transfer of Care (Signed)
Immediate Anesthesia Transfer of Care Note  Patient: Cody Suarez  Procedure(s) Performed: Procedure(s): LAPAROSCOPY DIAGNOSTIC (N/A) EXPLORATORY LAPAROTOMY, resection of mass at tail of pancreas (N/A)  Patient Location: PACU  Anesthesia Type:General  Level of Consciousness: sedated and responds to stimulation  Airway & Oxygen Therapy: Patient connected to face mask oxygen  Post-op Assessment: Post -op Vital signs reviewed and stable  Post vital signs: stable  Last Vitals:  Vitals:   11/05/15 0524  BP: (!) 161/91  Pulse: 90  Resp: 18  Temp: 36.4 C    Last Pain:  Vitals:   11/05/15 0524  TempSrc: Oral         Complications: No apparent anesthesia complications

## 2015-11-05 NOTE — Interval H&P Note (Signed)
History and Physical Interval Note:  11/05/2015 7:34 AM  Cody Suarez  has presented today for surgery, with the diagnosis of RECURRENT CANCER  The various methods of treatment have been discussed with the patient and family. After consideration of risks, benefits and other options for treatment, the patient has consented to  Procedure(s): LAPAROSCOPY DIAGNOSTIC (N/A) EXPLORATORY LAPAROTOMY (N/A) as a surgical intervention .  The patient's history has been reviewed, patient examined, no change in status, stable for surgery.  I have reviewed the patient's chart and labs.  Questions were answered to the patient's satisfaction.     Gianni Mihalik B

## 2015-11-05 NOTE — H&P (View-Only) (Signed)
Chief Complaint:  Recurrent cancer in the left upper quadrant  History of Present Illness:  Cody Suarez is an 69 y.o. male who underwent transverse colectomy in 2015 at which time omental extension was noted.  He has developed an isolated met that appears to be slightly inferior to the tip of the pancreas.  This may be in the root of the mesentery and I may need to get my hands on it to localize it and resect it safely.  Past Medical History:  Diagnosis Date  . Anemia of chronic disease 2015   "related to cancer tx" (08/09/2013)  . Arthritis    "thumb joints" (08/09/2013)  . Colon cancer (HCC) 03/2013  . Diarrhea   . Hypertension   . Kidney stones X 2   "passed them both"  . Peripheral vascular disease (HCC)   . Ulcerative colitis (HCC)    history    Past Surgical History:  Procedure Laterality Date  . ABDOMINAL AORTAGRAM N/A 08/09/2013   Procedure: ABDOMINAL AORTAGRAM;  Surgeon: Muhammad A Arida, MD;  Location: [facility] CATH LAB;  Service: Cardiovascular;  Laterality: N/A;  . CHOLECYSTECTOMY N/A 09/08/2014   Procedure: LAPAROSCOPIC CHOLECYSTECTOMY WITH INTRAOPERATIVE CHOLANGIOGRAM;  Surgeon: Todd Gerkin, MD;  Location: [facility] ORS;  Service: General;  Laterality: N/A;  . COLON SURGERY    . FEMORAL-POPLITEAL BYPASS GRAFT Right 08/11/2013   Procedure:   RIGHT - POPLITEAL TO PERONEAL ARTERY BYPASS GRAFT  WITH NONREVERSED SAPHENOUS VEIN GRAFT,tHROMBECTOMY ANTERIOR TIBIALIS,ATTEMPTED THROMBECTOMY TIBIO-PERONEAL TRUNK AND POSTERIOR TIBIALIS, INTRAOPERATIVE ARTERIOGRAM.;  Surgeon: James D Lawson, MD;  Location: [facility] OR;  Service: Vascular;  Laterality: Right;  . FEMORAL-TIBIAL BYPASS GRAFT Right 11/17/2013   Procedure: BYPASS GRAFT RIGHT ABOVE KNEE POPLITEAL TO POSTERIOR TIBIAL ARTERY USING RIGHT NON-REVERSED CEPHALIC VEIN;  Surgeon: James D Lawson, MD;  Location: [facility] OR;  Service: Vascular;  Laterality: Right;  . HERNIA REPAIR  ~ 1995; 03/2013   UHR  . INTRAOPERATIVE ARTERIOGRAM Right 11/17/2013   Procedure:  INTRA OPERATIVE ARTERIOGRAM;  Surgeon: James D Lawson, MD;  Location: [facility] OR;  Service: Vascular;  Laterality: Right;  . LAPAROSCOPIC RIGHT HEMI COLECTOMY N/A 03/28/2013   Procedure: LAPAROSCOPIC ASSISTED HEMI COLECTOMY;  Surgeon: Nelvin Tomb B Pansy Ostrovsky, MD;  Location: [facility] ORS;  Service: General;  Laterality: N/A;  . LOWER EXTREMITY ANGIOGRAM Bilateral 08/09/2013  . TEE WITHOUT CARDIOVERSION N/A 08/10/2013   Procedure: TRANSESOPHAGEAL ECHOCARDIOGRAM (TEE);  Surgeon: Mark Skains, MD;  Location: [facility] ENDOSCOPY;  Service: Cardiovascular;  Laterality: N/A;  . TONSILLECTOMY  ~ 1950  . UMBILICAL HERNIA REPAIR  1995; 03/2013  . VASECTOMY    . VEIN HARVEST Right 11/17/2013   Procedure: HARVEST OF RIGHT UPPER EXTREMITY CEPHALIC VEIN;  Surgeon: James D Lawson, MD;  Location: [facility] OR;  Service: Vascular;  Laterality: Right;    Current Outpatient Prescriptions  Medication Sig Dispense Refill  . budesonide (ENTOCORT EC) 3 MG 24 hr capsule Take 3 mg by mouth 2 (two) times daily.    . carvedilol (COREG) 6.25 MG tablet Take 1 tablet (6.25 mg total) by mouth 2 (two) times daily. 60 tablet 6  . diphenhydramine-acetaminophen (TYLENOL PM) 25-500 MG TABS Take 1 tablet by mouth at bedtime as needed. Reported on 01/22/2015    . losartan (COZAAR) 100 MG tablet Take 1 tablet (100 mg total) by mouth daily. 90 tablet 3  . spironolactone (ALDACTONE) 25 MG tablet TAKE 1 TABLET DAILY 30 tablet 11  . VIAGRA 50 MG tablet Take 50 mg by mouth daily as needed.    5  . XARELTO 20 MG TABS tablet TAKE 1 TABLET DAILY WITH BREAKFAST 30 tablet 3   No current facility-administered medications for this visit.    Amlodipine and Gabapentin Family History  Problem Relation Age of Onset  . Heart disease Mother   . Emphysema Father    Social History:   reports that he has never smoked. He has never used smokeless tobacco. He reports that he drinks about 1.8 oz of alcohol per week . He reports that he does not use drugs.   REVIEW OF SYSTEMS  : Negative except for see problem list  Physical Exam:   There were no vitals taken for this visit. There is no height or weight on file to calculate BMI.  Gen:  WDWN WM NAD  Neurological: Alert and oriented to person, place, and time. Motor and sensory function is grossly intact  Head: Normocephalic and atraumatic.  Eyes: Conjunctivae are normal. Pupils are equal, round, and reactive to light. No scleral icterus.  Neck: Normal range of motion. Neck supple. No tracheal deviation or thyromegaly present.  Cardiovascular:  SR without murmurs or gallops.  No carotid bruits Breast:  Not examined Respiratory: Effort normal.  No respiratory distress. No chest wall tenderness. Breath sounds normal.  No wheezes, rales or rhonchi.  Abdomen:  Hernia in midline incision GU:  Not examined Musculoskeletal: Normal range of motion. Extremities are nontender. No cyanosis, edema or clubbing noted Lymphadenopathy: No cervical, preauricular, postauricular or axillary adenopathy is present Skin: Skin is warm and dry. No rash noted. No diaphoresis. No erythema. No pallor. Pscyh: Normal mood and affect. Behavior is normal. Judgment and thought content normal.   LABORATORY RESULTS: No results found for this or any previous visit (from the past 48 hour(s)).   RADIOLOGY RESULTS: No results found.  Problem List: Patient Active Problem List   Diagnosis Date Noted  . Anemia of chronic disease 09/06/2014  . 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 Atherosclerotic PVD with ulceration (HCC) 11/14/2013  . Atherosclerotic PVD with intermittent claudication (HCC) 10/31/2013  . 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HCC) 09/26/2013  . Penetrating atherosclerotic ulcer of aorta (HCC) 08/14/2013  . Hypertension   . 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 Right inguinal hernia 03/23/2013  . Umbilical hernia 03/23/2013  . Ulcerative colitis (HCC) 03/23/2013     Assessment & Plan: Recurrent isolated mass in the left upper quadrant.      Matt B. Jenicka Coxe, MD, FACS  [facility] [phone] beeper [phone]  10/18/2015 4:03 PM      

## 2015-11-05 NOTE — Op Note (Signed)
Surgeon: Kaylyn Lim, MD, FACS  Asst:  Romana Juniper, MD  Anes:  General   Procedure: Laparoscopy with enterolysis, laparotomy with resection of tail of pancreas and recurrent cancer  Diagnosis: Recurrent colon cancer  Complications: None noted  EBL:   900  cc  Drains: 1 JP drain into the site of the tumor and pancreatic tail  Description of Procedure:  The patient was taken to OR 4 at [facility].  After anesthesia was administered and the patient was prepped a timeout was performed.  Access to the abdomen was achieved with a 5 mm Optiview through the left upper quadrant. Following insufflation a second 5 mm port was placed left lower quadrant. He had numerous adhesions to the midline and these were taken down with sharp dissection using scissors and the Harmonic Scalpel. Bowel was not involved in this revealed the previous midline incision with closure of Novafil. We realized that with the degree of adhesions we stage the abdomen and did not see anything in the liver or any studding in the mesentery and elected to then proceed with an open laparotomy because we were unable to see this mass as it was more retro-peritoneal.  We opened the lower portion of the incision including a hernia at the umbilicus and put in the wound protector. We eventually and extend that twice up in the upper midline because of the height insulin position of this mass. We had previously taken down his distal transverse and left colon so the lesser sac space had been collapsed and was scarred. Ligament of Treitz had been altered because the original tumor mass we had taken down into that vicinity. We however were able to palpate this and it was adjacent to the ligament of Treitz and was up higher in the tail of the pancreas.  The Bookwalter retractor was placed on the table in the renal a line longitudinally. We had a wound protector in place but use the retractor to demonstrate the left upper quadrant. We then took down some  small intestine ligament of Treitz region from this area. We then began bluntly and palpating and dissecting this from the surrounding tissue. It was very close to Gerota's fascia on the left kidney and appeared to involve the tail of the pancreas. We went around this and a very delivered fashion using the Harmonic Scalpel and eventually felt that we needed to resect the tail of the pancreas. We bluntly dissected beneath that and transected it with a Covidien 6 cm Black load with TRS. Continued dissection taking it off superiorly and eventually a delivering the mass dividing the last portion superiorly with a guide and separate 6) application using the Covidien stapler. We had typed and crossmatched patient and the accumulative blood loss from oozing necessitated transfusion. This frozen section confirmed we had the recurrent cancer and they would check  margins with permanent sections.  The bed where the tumor had been was inspected. Bleeding was further controlled with electrocautery. This was mainly some oozing. We applied some Arista to the bed and placed a 19 Blake drain was brought was brought out through the left lower 5 mm port.  Sponge and needle counts reported as correct. The fascia was closed with running double-stranded #1 PDS. Skin was closed with a stapler. Patient are the procedure well. He will go to ICU step down postop.  The patient tolerated the procedure well and was taken to the PACU in stable condition.     Matt B. Hassell Done, MD, FACS  [facility] [place]

## 2015-11-05 NOTE — Anesthesia Procedure Notes (Signed)
Procedure Name: Intubation Date/Time: 11/05/2015 7:50 AM Performed by: Pilar Grammes Pre-anesthesia Checklist: Patient identified, Emergency Drugs available, Suction available, Patient being monitored and Timeout performed Patient Re-evaluated:Patient Re-evaluated prior to inductionOxygen Delivery Method: Circle system utilized Preoxygenation: Pre-oxygenation with 100% oxygen Intubation Type: IV induction Ventilation: Mask ventilation without difficulty Laryngoscope Size: Miller and 2 Grade View: Grade I Tube type: Oral Tube size: 8.0 mm Number of attempts: 1 Airway Equipment and Method: Stylet Placement Confirmation: positive ETCO2,  ETT inserted through vocal cords under direct vision,  CO2 detector and breath sounds checked- equal and bilateral Secured at: 22 cm Tube secured with: Tape Dental Injury: Teeth and Oropharynx as per pre-operative assessment

## 2015-11-05 NOTE — Anesthesia Postprocedure Evaluation (Signed)
Anesthesia Post Note  Patient: Cody Suarez  Procedure(s) Performed: Procedure(s) (LRB): LAPAROSCOPY DIAGNOSTIC (N/A) EXPLORATORY LAPAROTOMY, resection of mass at tail of pancreas (N/A)  Patient location during evaluation: PACU Anesthesia Type: General Level of consciousness: awake and alert Pain management: pain level controlled Vital Signs Assessment: post-procedure vital signs reviewed and stable Respiratory status: spontaneous breathing, nonlabored ventilation, respiratory function stable and patient connected to nasal cannula oxygen Cardiovascular status: blood pressure returned to baseline and stable Postop Assessment: no signs of nausea or vomiting Anesthetic complications: no    Last Vitals:  Vitals:   11/05/15 1221 11/05/15 1230  BP: (!) 149/81 (!) 149/81  Pulse:  97  Resp:  11  Temp: 36.7 C 36.7 C    Last Pain:  Vitals:   11/05/15 1230  TempSrc: Oral  PainSc: Asleep                 Taggert Bozzi J

## 2015-11-05 NOTE — Anesthesia Preprocedure Evaluation (Addendum)
Anesthesia Evaluation  Patient identified by MRN, date of birth, ID band Patient awake  General Assessment Comment:Past Medical History: Diagnosis Date . Anemia of chronic disease 2015  "related to cancer tx" (08/09/2013) . Arthritis   "thumb joints" (08/09/2013) . Colon cancer ([place]) 03/2013 . Diarrhea  . Hypertension  . Kidney stones X 2  "passed them both" . Peripheral vascular disease ([place])  . Ulcerative colitis ([place])   history    Reviewed: Allergy & Precautions, NPO status , Patient's Chart, lab work & pertinent test results  Airway Mallampati: II  TM Distance: >3 FB Neck ROM: Full    Dental  (+) Edentulous Upper   Pulmonary neg pulmonary ROS,    Pulmonary exam normal breath sounds clear to auscultation       Cardiovascular hypertension, Pt. on medications + Peripheral Vascular Disease  Normal cardiovascular exam Rhythm:Regular Rate:Normal  ECHO 09-08-14: Study Conclusions  - Left ventricle: The cavity size was normal. Wall thickness was   normal. Systolic function was normal. The estimated ejection   fraction was in the range of 60% to 65%. Wall motion was normal;   there were no regional wall motion abnormalities. Doppler   parameters are consistent with abnormal left ventricular   relaxation (grade 1 diastolic dysfunction). - Aortic valve: There was trivial regurgitation.  Impressions:  - Normal LV systolic function; grade 1 diastolic dysfunction;   mildly sclerotic aortic valve with trace AI.   Neuro/Psych negative neurological ROS  negative psych ROS   GI/Hepatic Neg liver ROS, PUD,   Endo/Other  negative endocrine ROS  Renal/GU negative Renal ROS  negative genitourinary   Musculoskeletal negative musculoskeletal ROS (+)   Abdominal   Peds negative pediatric ROS (+)  Hematology  (+) anemia ,   Anesthesia Other Findings   Reproductive/Obstetrics negative OB ROS                           Anesthesia Physical Anesthesia Plan  ASA: III  Anesthesia Plan: General   Post-op Pain Management:    Induction: Intravenous  Airway Management Planned: Oral ETT  Additional Equipment:   Intra-op Plan:   Post-operative Plan: Extubation in OR  Informed Consent: I have reviewed the patients History and Physical, chart, labs and discussed the procedure including the risks, benefits and alternatives for the proposed anesthesia with the patient or authorized representative who has indicated his/her understanding and acceptance.   Dental advisory given  Plan Discussed with: CRNA  Anesthesia Plan Comments:        Anesthesia Quick Evaluation

## 2015-11-06 LAB — CBC
HCT: 40.4 % (ref 39.0–52.0)
Hemoglobin: 13.8 g/dL (ref 13.0–17.0)
MCH: 30.5 pg (ref 26.0–34.0)
MCHC: 34.2 g/dL (ref 30.0–36.0)
MCV: 89.2 fL (ref 78.0–100.0)
PLATELETS: 260 10*3/uL (ref 150–400)
RBC: 4.53 MIL/uL (ref 4.22–5.81)
RDW: 14.5 % (ref 11.5–15.5)
WBC: 18.8 10*3/uL — ABNORMAL HIGH (ref 4.0–10.5)

## 2015-11-06 LAB — COMPREHENSIVE METABOLIC PANEL
ALT: 26 U/L (ref 17–63)
ANION GAP: 7 (ref 5–15)
AST: 34 U/L (ref 15–41)
Albumin: 3.5 g/dL (ref 3.5–5.0)
Alkaline Phosphatase: 38 U/L (ref 38–126)
BUN: 18 mg/dL (ref 6–20)
CHLORIDE: 105 mmol/L (ref 101–111)
CO2: 22 mmol/L (ref 22–32)
Calcium: 7.6 mg/dL — ABNORMAL LOW (ref 8.9–10.3)
Creatinine, Ser: 1.23 mg/dL (ref 0.61–1.24)
GFR, EST NON AFRICAN AMERICAN: 58 mL/min — AB (ref >60)
Glucose, Bld: 240 mg/dL — ABNORMAL HIGH (ref 65–99)
Potassium: 5.1 mmol/L (ref 3.5–5.1)
SODIUM: 134 mmol/L — AB (ref 135–145)
Total Bilirubin: 0.8 mg/dL (ref 0.3–1.2)
Total Protein: 6.4 g/dL — ABNORMAL LOW (ref 6.5–8.1)

## 2015-11-06 LAB — LIPASE, BLOOD: LIPASE: 91 U/L — AB (ref 11–51)

## 2015-11-06 MED ORDER — POTASSIUM CHLORIDE IN NACL 20-0.9 MEQ/L-% IV SOLN
INTRAVENOUS | Status: DC
  Administered 2015-11-06 – 2015-11-07 (×3): via INTRAVENOUS
  Administered 2015-11-08: 100 mL/h via INTRAVENOUS
  Administered 2015-11-09 – 2015-11-10 (×2): via INTRAVENOUS
  Filled 2015-11-06 (×9): qty 1000

## 2015-11-06 NOTE — Progress Notes (Signed)
Patient ID: Cody Suarez, male   DOB: Jan 01, 1946, 70 y.o.   MRN: [id number] [facility] Surgery Progress Note:   1 Day Post-Op  Subjective: Mental status is clear.  Minimal pain Objective: Vital signs in last 24 hours: Temp:  [98.1 F (36.7 C)-98.5 F (36.9 C)] 98.3 F (36.8 C) (11/01 0301) Pulse Rate:  [90-110] 101 (11/01 0800) Resp:  [7-26] 17 (11/01 0800) BP: (105-176)/(75-107) 176/80 (11/01 0800) SpO2:  [92 %-100 %] 94 % (11/01 0800)  Intake/Output from previous day: 10/31 0701 - 11/01 0700 In: 4185 [I.V.:3720; Blood:335; IV Piggyback:100] Out: 1845 [Urine:805; Drains:90; Blood:950] Intake/Output this shift: Total I/O In: 300 [I.V.:300] Out: 45 [Urine:15; Drains:30]  Physical Exam: Work of breathing is not labored.  Incision covered.  JP serosanguinous  Lab Results:  Results for orders placed or performed during the hospital encounter of 11/05/15 (from the past 48 hour(s))  PT- INR Day of Surgery     Status: None   Collection Time: 11/05/15  5:23 AM  Result Value Ref Range   Prothrombin Time 13.6 11.4 - 15.2 seconds   INR 1.04   Type and screen [place]     Status: None (Preliminary result)   Collection Time: 11/05/15  9:20 AM  Result Value Ref Range   ABO/RH(D) B NEG    Antibody Screen NEG    Sample Expiration 11/08/2015    Unit Number [id number]    Blood Component Type RED CELLS,LR    Unit division 00    Status of Unit ALLOCATED    Transfusion Status OK TO TRANSFUSE    Crossmatch Result Compatible    Unit Number [id number]    Blood Component Type RED CELLS,LR    Unit division 00    Status of Unit ISSUED,FINAL    Transfusion Status OK TO TRANSFUSE    Crossmatch Result Compatible   Prepare RBC     Status: None   Collection Time: 11/05/15  9:42 AM  Result Value Ref Range   Order Confirmation ORDER PROCESSED BY BLOOD BANK   CBC     Status: Abnormal   Collection Time: 11/05/15 11:39 AM  Result Value Ref Range   WBC 21.5 (H)  4.0 - 10.5 K/uL    Comment: WHITE COUNT CONFIRMED ON SMEAR ADJUSTED FOR NUCLEATED RBC'S    RBC 5.16 4.22 - 5.81 MIL/uL   Hemoglobin 15.8 13.0 - 17.0 g/dL   HCT 46.6 39.0 - 52.0 %   MCV 90.3 78.0 - 100.0 fL   MCH 30.6 26.0 - 34.0 pg   MCHC 33.9 30.0 - 36.0 g/dL   RDW 14.8 11.5 - 15.5 %   Platelets 250 150 - 400 K/uL  Basic metabolic panel     Status: Abnormal   Collection Time: 11/05/15 11:39 AM  Result Value Ref Range   Sodium 137 135 - 145 mmol/L   Potassium 5.1 3.5 - 5.1 mmol/L   Chloride 104 101 - 111 mmol/L   CO2 24 22 - 32 mmol/L   Glucose, Bld 203 (H) 65 - 99 mg/dL   BUN 15 6 - 20 mg/dL   Creatinine, Ser 1.37 (H) 0.61 - 1.24 mg/dL   Calcium 8.0 (L) 8.9 - 10.3 mg/dL   GFR calc non Af Amer 51 (L) >60 mL/min   GFR calc Af Amer 59 (L) >60 mL/min    Comment: (NOTE) The eGFR has been calculated using the CKD EPI equation. This calculation has not been validated in all clinical situations. eGFR's persistently <  60 mL/min signify possible Chronic Kidney Disease.    Anion gap 9 5 - 15  Lipase, blood     Status: Abnormal   Collection Time: 11/05/15 11:39 AM  Result Value Ref Range   Lipase 111 (H) 11 - 51 U/L  MRSA PCR Screening     Status: None   Collection Time: 11/05/15 12:18 PM  Result Value Ref Range   MRSA by PCR NEGATIVE NEGATIVE    Comment:        The GeneXpert MRSA Assay (FDA approved for NASAL specimens only), is one component of a comprehensive MRSA colonization surveillance program. It is not intended to diagnose MRSA infection nor to guide or monitor treatment for MRSA infections.   CBC     Status: Abnormal   Collection Time: 11/06/15  3:23 AM  Result Value Ref Range   WBC 18.8 (H) 4.0 - 10.5 K/uL   RBC 4.53 4.22 - 5.81 MIL/uL   Hemoglobin 13.8 13.0 - 17.0 g/dL   HCT 40.4 39.0 - 52.0 %   MCV 89.2 78.0 - 100.0 fL   MCH 30.5 26.0 - 34.0 pg   MCHC 34.2 30.0 - 36.0 g/dL   RDW 14.5 11.5 - 15.5 %   Platelets 260 150 - 400 K/uL  Comprehensive metabolic  panel     Status: Abnormal   Collection Time: 11/06/15  3:23 AM  Result Value Ref Range   Sodium 134 (L) 135 - 145 mmol/L   Potassium 5.1 3.5 - 5.1 mmol/L   Chloride 105 101 - 111 mmol/L   CO2 22 22 - 32 mmol/L   Glucose, Bld 240 (H) 65 - 99 mg/dL   BUN 18 6 - 20 mg/dL   Creatinine, Ser 1.23 0.61 - 1.24 mg/dL   Calcium 7.6 (L) 8.9 - 10.3 mg/dL   Total Protein 6.4 (L) 6.5 - 8.1 g/dL   Albumin 3.5 3.5 - 5.0 g/dL   AST 34 15 - 41 U/L   ALT 26 17 - 63 U/L   Alkaline Phosphatase 38 38 - 126 U/L   Total Bilirubin 0.8 0.3 - 1.2 mg/dL   GFR calc non Af Amer 58 (L) >60 mL/min   GFR calc Af Amer >60 >60 mL/min    Comment: (NOTE) The eGFR has been calculated using the CKD EPI equation. This calculation has not been validated in all clinical situations. eGFR's persistently <60 mL/min signify possible Chronic Kidney Disease.    Anion gap 7 5 - 15  Lipase, blood     Status: Abnormal   Collection Time: 11/06/15  3:23 AM  Result Value Ref Range   Lipase 91 (H) 11 - 51 U/L    Radiology/Results: No results found.  Anti-infectives: Anti-infectives    Start     Dose/Rate Route Frequency Ordered Stop   11/05/15 1600  ceFAZolin (ANCEF) IVPB 2g/100 mL premix     2 g 200 mL/hr over 30 Minutes Intravenous Every 8 hours 11/05/15 1228 11/05/15 1650   11/05/15 0510  cefoTEtan in Dextrose 5% (CEFOTAN) IVPB 2 g     2 g Intravenous On call to O.R. 11/05/15 0510 11/05/15 0758      Assessment/Plan: Problem List: Patient Active Problem List   Diagnosis Date Noted  . Recurrent colorectal cancer ([place]) 11/05/2015  . Anemia of chronic disease 09/06/2014  . 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 Atherosclerotic PVD with intermittent claudication ([place]) 10/31/2013  . 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 Hypertension   . Arterial thromboembolism ([place]) 08/09/2013  . 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 Right inguinal hernia 03/23/2013  . Umbilical hernia 28/97/9150  . Ulcerative colitis ([place]) 03/23/2013    Has been stable postop in stepdown.  Weak and limited walking. No flatus yet.   1 Day Post-Op    LOS: 1 day   Matt B. Hassell Done, MD, [facility] [phone] beeper [phone]  11/06/2015 9:05 AM

## 2015-11-06 NOTE — Progress Notes (Signed)
Date:  November 06, 2015 Chart reviewed for concurrent status and case management needs. Will continue to follow the patient for status change: Discharge Planning: following for needs Expected discharge date: [id number] Velva Harman, BSN, [place], [place]

## 2015-11-07 LAB — TYPE AND SCREEN
ABO/RH(D): B NEG
Antibody Screen: NEGATIVE
UNIT DIVISION: 0
UNIT DIVISION: 0

## 2015-11-07 LAB — BASIC METABOLIC PANEL
Anion gap: 6 (ref 5–15)
BUN: 14 mg/dL (ref 6–20)
CALCIUM: 7.6 mg/dL — AB (ref 8.9–10.3)
CO2: 24 mmol/L (ref 22–32)
CREATININE: 0.92 mg/dL (ref 0.61–1.24)
Chloride: 107 mmol/L (ref 101–111)
GFR calc non Af Amer: >60 mL/min (ref >60)
Glucose, Bld: 152 mg/dL — ABNORMAL HIGH (ref 65–99)
Potassium: 4.2 mmol/L (ref 3.5–5.1)
SODIUM: 137 mmol/L (ref 135–145)

## 2015-11-07 LAB — CBC
HCT: 35.9 % — ABNORMAL LOW (ref 39.0–52.0)
Hemoglobin: 11.6 g/dL — ABNORMAL LOW (ref 13.0–17.0)
MCH: 30.1 pg (ref 26.0–34.0)
MCHC: 32.3 g/dL (ref 30.0–36.0)
MCV: 93.2 fL (ref 78.0–100.0)
PLATELETS: 210 10*3/uL (ref 150–400)
RBC: 3.85 MIL/uL — ABNORMAL LOW (ref 4.22–5.81)
RDW: 14.8 % (ref 11.5–15.5)
WBC: 20.4 10*3/uL — ABNORMAL HIGH (ref 4.0–10.5)

## 2015-11-07 NOTE — Progress Notes (Signed)
Patient ID: Cody Suarez, male   DOB: Dec 08, 1945, 70 y.o.   MRN: [id number] [facility] Surgery Progress Note:   2 Days Post-Op  Subjective: Mental status is clear.  He complains that the ICU bed is hurting his back Objective: Vital signs in last 24 hours: Temp:  [98.3 F (36.8 C)-98.5 F (36.9 C)] 98.3 F (36.8 C) (11/02 0330) Pulse Rate:  [91-111] 99 (11/02 0600) Resp:  [7-26] 12 (11/02 0600) BP: (138-196)/(67-87) 142/67 (11/02 0400) SpO2:  [96 %-99 %] 97 % (11/02 0600)  Intake/Output from previous day: 11/01 0701 - 11/02 0700 In: 2400 [I.V.:2300; IV Piggyback:100] Out: 490 [Urine:390; Drains:100] Intake/Output this shift: No intake/output data recorded.  Physical Exam: Work of breathing is normal.  Incisions OK.  JP scant bloody.   Lab Results:  Results for orders placed or performed during the hospital encounter of 11/05/15 (from the past 48 hour(s))  Type and screen [place]     Status: None   Collection Time: 11/05/15  9:20 AM  Result Value Ref Range   ABO/RH(D) B NEG    Antibody Screen NEG    Sample Expiration 11/08/2015    Unit Number [id number]    Blood Component Type RED CELLS,LR    Unit division 00    Status of Unit REL FROM [facility]    Transfusion Status OK TO TRANSFUSE    Crossmatch Result Compatible    Unit Number [id number]    Blood Component Type RED CELLS,LR    Unit division 00    Status of Unit ISSUED,FINAL    Transfusion Status OK TO TRANSFUSE    Crossmatch Result Compatible   Prepare RBC     Status: None   Collection Time: 11/05/15  9:42 AM  Result Value Ref Range   Order Confirmation ORDER PROCESSED BY BLOOD BANK   CBC     Status: Abnormal   Collection Time: 11/05/15 11:39 AM  Result Value Ref Range   WBC 21.5 (H) 4.0 - 10.5 K/uL    Comment: WHITE COUNT CONFIRMED ON SMEAR ADJUSTED FOR NUCLEATED RBC'S    RBC 5.16 4.22 - 5.81 MIL/uL   Hemoglobin 15.8 13.0 - 17.0 g/dL   HCT 46.6 39.0 - 52.0 %   MCV 90.3 78.0 -  100.0 fL   MCH 30.6 26.0 - 34.0 pg   MCHC 33.9 30.0 - 36.0 g/dL   RDW 14.8 11.5 - 15.5 %   Platelets 250 150 - 400 K/uL  Basic metabolic panel     Status: Abnormal   Collection Time: 11/05/15 11:39 AM  Result Value Ref Range   Sodium 137 135 - 145 mmol/L   Potassium 5.1 3.5 - 5.1 mmol/L   Chloride 104 101 - 111 mmol/L   CO2 24 22 - 32 mmol/L   Glucose, Bld 203 (H) 65 - 99 mg/dL   BUN 15 6 - 20 mg/dL   Creatinine, Ser 1.37 (H) 0.61 - 1.24 mg/dL   Calcium 8.0 (L) 8.9 - 10.3 mg/dL   GFR calc non Af Amer 51 (L) >60 mL/min   GFR calc Af Amer 59 (L) >60 mL/min    Comment: (NOTE) The eGFR has been calculated using the CKD EPI equation. This calculation has not been validated in all clinical situations. eGFR's persistently <60 mL/min signify possible Chronic Kidney Disease.    Anion gap 9 5 - 15  Lipase, blood     Status: Abnormal   Collection Time: 11/05/15 11:39 AM  Result Value Ref Range  Lipase 111 (H) 11 - 51 U/L  MRSA PCR Screening     Status: None   Collection Time: 11/05/15 12:18 PM  Result Value Ref Range   MRSA by PCR NEGATIVE NEGATIVE    Comment:        The GeneXpert MRSA Assay (FDA approved for NASAL specimens only), is one component of a comprehensive MRSA colonization surveillance program. It is not intended to diagnose MRSA infection nor to guide or monitor treatment for MRSA infections.   CBC     Status: Abnormal   Collection Time: 11/06/15  3:23 AM  Result Value Ref Range   WBC 18.8 (H) 4.0 - 10.5 K/uL   RBC 4.53 4.22 - 5.81 MIL/uL   Hemoglobin 13.8 13.0 - 17.0 g/dL   HCT 40.4 39.0 - 52.0 %   MCV 89.2 78.0 - 100.0 fL   MCH 30.5 26.0 - 34.0 pg   MCHC 34.2 30.0 - 36.0 g/dL   RDW 14.5 11.5 - 15.5 %   Platelets 260 150 - 400 K/uL  Comprehensive metabolic panel     Status: Abnormal   Collection Time: 11/06/15  3:23 AM  Result Value Ref Range   Sodium 134 (L) 135 - 145 mmol/L   Potassium 5.1 3.5 - 5.1 mmol/L   Chloride 105 101 - 111 mmol/L   CO2 22 22  - 32 mmol/L   Glucose, Bld 240 (H) 65 - 99 mg/dL   BUN 18 6 - 20 mg/dL   Creatinine, Ser 1.23 0.61 - 1.24 mg/dL   Calcium 7.6 (L) 8.9 - 10.3 mg/dL   Total Protein 6.4 (L) 6.5 - 8.1 g/dL   Albumin 3.5 3.5 - 5.0 g/dL   AST 34 15 - 41 U/L   ALT 26 17 - 63 U/L   Alkaline Phosphatase 38 38 - 126 U/L   Total Bilirubin 0.8 0.3 - 1.2 mg/dL   GFR calc non Af Amer 58 (L) >60 mL/min   GFR calc Af Amer >60 >60 mL/min    Comment: (NOTE) The eGFR has been calculated using the CKD EPI equation. This calculation has not been validated in all clinical situations. eGFR's persistently <60 mL/min signify possible Chronic Kidney Disease.    Anion gap 7 5 - 15  Lipase, blood     Status: Abnormal   Collection Time: 11/06/15  3:23 AM  Result Value Ref Range   Lipase 91 (H) 11 - 51 U/L  CBC     Status: Abnormal   Collection Time: 11/07/15  5:21 AM  Result Value Ref Range   WBC 20.4 (H) 4.0 - 10.5 K/uL   RBC 3.85 (L) 4.22 - 5.81 MIL/uL   Hemoglobin 11.6 (L) 13.0 - 17.0 g/dL   HCT 35.9 (L) 39.0 - 52.0 %   MCV 93.2 78.0 - 100.0 fL   MCH 30.1 26.0 - 34.0 pg   MCHC 32.3 30.0 - 36.0 g/dL   RDW 14.8 11.5 - 15.5 %   Platelets 210 150 - 400 K/uL  Basic metabolic panel     Status: Abnormal   Collection Time: 11/07/15  5:21 AM  Result Value Ref Range   Sodium 137 135 - 145 mmol/L   Potassium 4.2 3.5 - 5.1 mmol/L    Comment: DELTA CHECK NOTED   Chloride 107 101 - 111 mmol/L   CO2 24 22 - 32 mmol/L   Glucose, Bld 152 (H) 65 - 99 mg/dL   BUN 14 6 - 20 mg/dL   Creatinine, Ser 0.92  0.61 - 1.24 mg/dL   Calcium 7.6 (L) 8.9 - 10.3 mg/dL   GFR calc non Af Amer >60 >60 mL/min   GFR calc Af Amer >60 >60 mL/min    Comment: (NOTE) The eGFR has been calculated using the CKD EPI equation. This calculation has not been validated in all clinical situations. eGFR's persistently <60 mL/min signify possible Chronic Kidney Disease.    Anion gap 6 5 - 15    Radiology/Results: No results  found.  Anti-infectives: Anti-infectives    Start     Dose/Rate Route Frequency Ordered Stop   11/05/15 1600  ceFAZolin (ANCEF) IVPB 2g/100 mL premix     2 g 200 mL/hr over 30 Minutes Intravenous Every 8 hours 11/05/15 1228 11/05/15 1650   11/05/15 0510  cefoTEtan in Dextrose 5% (CEFOTAN) IVPB 2 g     2 g Intravenous On call to O.R. 11/05/15 0510 11/05/15 0758      Assessment/Plan: Problem List: Patient Active Problem List   Diagnosis Date Noted  . Recurrent colorectal cancer ([place]) 11/05/2015  . Anemia of chronic disease 09/06/2014  . 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 Atherosclerotic PVD with intermittent claudication ([place]) 10/31/2013  . 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 Hypertension   . Arterial thromboembolism ([place]) 08/09/2013  . 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 Right inguinal hernia 03/23/2013  . Umbilical hernia 15/17/6160  . Ulcerative colitis ([place]) 03/23/2013    Will transfer to floor.  Check labs in AM.  Get up and ambulate.  No flatus yet so still NPO.  Will check serum amylase in AM.   2 Days Post-Op    LOS: 2 days   Matt B. Hassell Done, MD, [facility] [phone] beeper [phone]  11/07/2015 8:09 AM

## 2015-11-08 LAB — BASIC METABOLIC PANEL
Anion gap: 6 (ref 5–15)
BUN: 10 mg/dL (ref 6–20)
CALCIUM: 7.5 mg/dL — AB (ref 8.9–10.3)
CO2: 24 mmol/L (ref 22–32)
CREATININE: 0.88 mg/dL (ref 0.61–1.24)
Chloride: 108 mmol/L (ref 101–111)
GFR calc non Af Amer: >60 mL/min (ref >60)
GLUCOSE: 125 mg/dL — AB (ref 65–99)
Potassium: 3.9 mmol/L (ref 3.5–5.1)
Sodium: 138 mmol/L (ref 135–145)

## 2015-11-08 LAB — LIPASE, BLOOD: Lipase: 34 U/L (ref 11–51)

## 2015-11-08 LAB — CBC WITH DIFFERENTIAL/PLATELET
Basophils Absolute: 0 10*3/uL (ref 0.0–0.1)
Basophils Relative: 0 %
EOS PCT: 1 %
Eosinophils Absolute: 0.2 10*3/uL (ref 0.0–0.7)
HCT: 33 % — ABNORMAL LOW (ref 39.0–52.0)
Hemoglobin: 10.8 g/dL — ABNORMAL LOW (ref 13.0–17.0)
LYMPHS ABS: 1.9 10*3/uL (ref 0.7–4.0)
Lymphocytes Relative: 12 %
MCH: 30.4 pg (ref 26.0–34.0)
MCHC: 32.7 g/dL (ref 30.0–36.0)
MCV: 93 fL (ref 78.0–100.0)
MONO ABS: 2.1 10*3/uL — AB (ref 0.1–1.0)
Monocytes Relative: 13 %
Neutro Abs: 11.9 10*3/uL — ABNORMAL HIGH (ref 1.7–7.7)
Neutrophils Relative %: 74 %
PLATELETS: 230 10*3/uL (ref 150–400)
RBC: 3.55 MIL/uL — AB (ref 4.22–5.81)
RDW: 14.6 % (ref 11.5–15.5)
WBC: 16.1 10*3/uL — AB (ref 4.0–10.5)

## 2015-11-09 ENCOUNTER — Inpatient Hospital Stay (HOSPITAL_COMMUNITY): Payer: 59

## 2015-11-09 NOTE — Progress Notes (Signed)
Patient ID: Cody Suarez, male   DOB: 1945-10-05, 70 y.o.   MRN: [id number] Doing well/ [place] [facility] Progress Note:   4 Days Post-Op  Subjective: Mental status is clear Objective: Vital signs in last 24 hours: Temp:  [97.4 F (36.3 C)-98.6 F (37 C)] 98.6 F (37 C) (11/04 1022) Pulse Rate:  [86-96] 89 (11/04 1022) Resp:  [11-18] 11 (11/04 1022) BP: (121-151)/(59-70) 146/69 (11/04 1022) SpO2:  [96 %-97 %] 96 % (11/04 1022) FiO2 (%):  [21 %-37 %] 21 % (11/04 0800)  Intake/Output from previous day: 11/03 0701 - 11/04 0700 In: 2450 [I.V.:2400; IV Piggyback:50] Out: 1180 [Urine:1100; Drains:80] Intake/Output this shift: Total I/O In: -  Out: 250 [Urine:200; Drains:50]  Physical Exam: Work of breathing is normal.  CXR OK  Lab Results:  Results for orders placed or performed during the hospital encounter of 11/05/15 (from the past 48 hour(s))  CBC with Differential/Platelet     Status: Abnormal   Collection Time: 11/08/15  4:33 AM  Result Value Ref Range   WBC 16.1 (H) 4.0 - 10.5 K/uL   RBC 3.55 (L) 4.22 - 5.81 MIL/uL   Hemoglobin 10.8 (L) 13.0 - 17.0 g/dL   HCT 33.0 (L) 39.0 - 52.0 %   MCV 93.0 78.0 - 100.0 fL   MCH 30.4 26.0 - 34.0 pg   MCHC 32.7 30.0 - 36.0 g/dL   RDW 14.6 11.5 - 15.5 %   Platelets 230 150 - 400 K/uL   Neutrophils Relative % 74 %   Lymphocytes Relative 12 %   Monocytes Relative 13 %   Eosinophils Relative 1 %   Basophils Relative 0 %   Neutro Abs 11.9 (H) 1.7 - 7.7 K/uL   Lymphs Abs 1.9 0.7 - 4.0 K/uL   Monocytes Absolute 2.1 (H) 0.1 - 1.0 K/uL   Eosinophils Absolute 0.2 0.0 - 0.7 K/uL   Basophils Absolute 0.0 0.0 - 0.1 K/uL   Smear Review MORPHOLOGY UNREMARKABLE   Lipase, blood     Status: None   Collection Time: 11/08/15  4:33 AM  Result Value Ref Range   Lipase 34 11 - 51 U/L  Basic metabolic panel     Status: Abnormal   Collection Time: 11/08/15  4:33 AM  Result Value Ref Range   Sodium 138 135 - 145 mmol/L   Potassium 3.9 3.5  - 5.1 mmol/L   Chloride 108 101 - 111 mmol/L   CO2 24 22 - 32 mmol/L   Glucose, Bld 125 (H) 65 - 99 mg/dL   BUN 10 6 - 20 mg/dL   Creatinine, Ser 0.88 0.61 - 1.24 mg/dL   Calcium 7.5 (L) 8.9 - 10.3 mg/dL   GFR calc non Af Amer >60 >60 mL/min   GFR calc Af Amer >60 >60 mL/min    Comment: (NOTE) The eGFR has been calculated using the CKD EPI equation. This calculation has not been validated in all clinical situations. eGFR's persistently <60 mL/min signify possible Chronic Kidney Disease.    Anion gap 6 5 - 15    Radiology/Results: Dg Chest 2 View  Result Date: 11/09/2015 CLINICAL DATA:  [place]. Hx colon cancer, htn, PVD, nonsmoker EXAM: CHEST  2 VIEW COMPARISON:  09/06/2014 and 09/26/2013 FINDINGS: Lung volumes are relatively low. Mild streaky opacity at the lung bases likely atelectasis. No convincing pneumonia. No pulmonary edema. No pleural effusion or pneumothorax. Cardiac silhouette is normal in size. No mediastinal or hilar masses or convincing adenopathy. Skeletal structures are intact.  IMPRESSION: No active cardiopulmonary disease. Electronically Signed   By: Lajean Manes M.D.   On: 11/09/2015 08:32    Anti-infectives: Anti-infectives    Start     Dose/Rate Route Frequency Ordered Stop   11/05/15 1600  ceFAZolin (ANCEF) IVPB 2g/100 mL premix     2 g 200 mL/hr over 30 Minutes Intravenous Every 8 hours 11/05/15 1228 11/05/15 1650   11/05/15 0510  cefoTEtan in Dextrose 5% (CEFOTAN) IVPB 2 g     2 g Intravenous On call to O.R. 11/05/15 0510 11/05/15 0758      Assessment/Plan: Problem List: Patient Active Problem List   Diagnosis Date Noted  . Recurrent colorectal cancer ([place]) 11/05/2015  . Anemia of chronic disease 09/06/2014  . 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 Atherosclerotic PVD with intermittent claudication ([place]) 10/31/2013  . 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 Penetrating atherosclerotic ulcer of aorta  ([place]) 08/14/2013  . Hypertension   . Arterial thromboembolism ([place]) 08/09/2013  . 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 Right inguinal hernia 03/23/2013  . Umbilical hernia 36/62/9476  . Ulcerative colitis ([place]) 03/23/2013    Doing well.  Will advance to full liquids.  May resume Xarelto tomorrow 4 Days Post-Op    LOS: 4 days   Matt B. Hassell Done, MD, [facility] [phone] beeper [phone]  11/09/2015 11:12 AM

## 2015-11-10 MED ORDER — HYDROCODONE-ACETAMINOPHEN 5-325 MG PO TABS: 1.0000 | ORAL_TABLET | ORAL | Status: DC | PRN

## 2015-11-10 NOTE — Progress Notes (Signed)
Patient ID: Cody Suarez, male   DOB: Nov 24, 1945, 70 y.o.   MRN: [id number] [facility] Surgery Progress Note:   5 Days Post-Op  Subjective: Mental status is clear.  Feeling better Objective: Vital signs in last 24 hours: Temp:  [97.7 F (36.5 C)-98.6 F (37 C)] 97.7 F (36.5 C) (11/05 0647) Pulse Rate:  [84-92] 91 (11/05 0647) Resp:  [11-17] 11 (11/05 0800) BP: (141-158)/(64-73) 156/65 (11/05 0647) SpO2:  [94 %-99 %] 94 % (11/05 0800) FiO2 (%):  [21 %] 21 % (11/04 1200)  Intake/Output from previous day: 11/04 0701 - 11/05 0700 In: 1857.5 [P.O.:540; I.V.:1317.5] Out: 1311 [Urine:1026; Drains:285] Intake/Output this shift: No intake/output data recorded.  Physical Exam: Work of breathing is normal.  Minimal pain  Lab Results:  No results found for this or any previous visit (from the past 48 hour(s)).  Radiology/Results: Dg Chest 2 View  Result Date: 11/09/2015 CLINICAL DATA:  [place]. Hx colon cancer, htn, PVD, nonsmoker EXAM: CHEST  2 VIEW COMPARISON:  09/06/2014 and 09/26/2013 FINDINGS: Lung volumes are relatively low. Mild streaky opacity at the lung bases likely atelectasis. No convincing pneumonia. No pulmonary edema. No pleural effusion or pneumothorax. Cardiac silhouette is normal in size. No mediastinal or hilar masses or convincing adenopathy. Skeletal structures are intact. IMPRESSION: No active cardiopulmonary disease. Electronically Signed   By: Lajean Manes M.D.   On: 11/09/2015 08:32    Anti-infectives: Anti-infectives    Start     Dose/Rate Route Frequency Ordered Stop   11/05/15 1600  ceFAZolin (ANCEF) IVPB 2g/100 mL premix     2 g 200 mL/hr over 30 Minutes Intravenous Every 8 hours 11/05/15 1228 11/05/15 1650   11/05/15 0510  cefoTEtan in Dextrose 5% (CEFOTAN) IVPB 2 g     2 g Intravenous On call to O.R. 11/05/15 0510 11/05/15 0758      Assessment/Plan: Problem List: Patient Active Problem List   Diagnosis Date Noted  . Recurrent colorectal  cancer ([place]) 11/05/2015  . Anemia of chronic disease 09/06/2014  . 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 Atherosclerotic PVD with intermittent claudication ([place]) 10/31/2013  . 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 Hypertension   . Arterial thromboembolism ([place]) 08/09/2013  . 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 Right inguinal hernia 03/23/2013  . Umbilical hernia 81/10/3157  . Ulcerative colitis ([place]) 03/23/2013    Check amylase on drainage from JP.  Advance to regular diet.   5 Days Post-Op    LOS: 5 days   Matt B. Hassell Done, MD, [facility] [phone] beeper [phone]  11/10/2015 9:28 AM

## 2015-11-11 ENCOUNTER — Ambulatory Visit: Payer: 59 | Admitting: Family Medicine

## 2015-11-11 LAB — AMYLASE, PERITONEAL FLUID: AMYLASE, PERITONEAL FLUID: 424 U/L

## 2015-11-11 MED ORDER — HYDROCODONE-ACETAMINOPHEN 5-325 MG PO TABS: 1.0000 | ORAL_TABLET | ORAL | 0 refills | Status: DC | PRN

## 2015-11-11 NOTE — Discharge Summary (Signed)
Physician Discharge Summary  Patient ID: Cody Suarez MRN: [id number] DOB/AGE: 1945-03-25 70 y.o.  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  Observed carefully because of anticoagulation status and propensity to ooze from the resection bed.  Drain left in as amylase content over 400 in drainage.  Will send home with drain in place  Consults: Julieanne Manson  Significant Diagnostic Studies: path    Discharge Exam: Blood pressure (!) 158/75, pulse 96, temperature 98.1 F (36.7 C), temperature source Oral, resp. rate 13, height 5' 10"  (1.778 m), weight 88.5 kg (195 lb), SpO2 93 %. Incisions OK-will remove staples prior to discharge  Disposition: 01-Home or Self Care  Discharge Instructions    Call MD for:  persistant nausea and vomiting    Complete by:  As directed    Call MD for:  redness, tenderness, or signs of infection (pain, swelling, redness, odor or green/yellow discharge around incision site)    Complete by:  As directed    Call MD for:  severe uncontrolled pain    Complete by:  As directed    Diet - low sodium heart healthy    Complete by:  As directed    Discharge instructions    Complete by:  As directed    Take Xarelto today when you get home and then return to your normal schedule Recharge the bulb on the drain and empty and record the output.   Increase activity slowly    Complete by:  As directed        Medication List    TAKE these medications   budesonide 3 MG 24 hr capsule Commonly known as:  ENTOCORT EC Take 6 mg by mouth daily.   carvedilol 6.25 MG tablet Commonly known as:  COREG Take 1 tablet (6.25 mg total) by mouth 2 (two) times daily.    diphenhydramine-acetaminophen 25-500 MG Tabs tablet Commonly known as:  TYLENOL PM Take 1 tablet by mouth at bedtime as needed (sleep). Reported on 01/22/2015   HYDROcodone-acetaminophen 5-325 MG tablet Commonly known as:  NORCO/VICODIN Take 1-2 tablets by mouth every 4 (four) hours as needed for moderate pain.   losartan 100 MG tablet Commonly known as:  COZAAR Take 1 tablet (100 mg total) by mouth daily.   multivitamin with minerals Tabs tablet Take 1 tablet by mouth daily.   spironolactone 25 MG tablet Commonly known as:  ALDACTONE TAKE 1 TABLET DAILY   VIAGRA 50 MG tablet Generic drug:  sildenafil Take 50 mg by mouth daily as needed for erectile dysfunction.   XARELTO 20 MG Tabs tablet Generic drug:  rivaroxaban TAKE 1 TABLET DAILY WITH BREAKFAST      Follow-up Information    Jeniece Hannis B, MD Follow up.   Specialty:  General Surgery Contact information: [address] [place] [address] [phone]        Betsy Coder, MD Follow up.   Specialty:  Oncology Contact information: [place] [address] [phone]           Signed: Pedro Earls 11/11/2015, 8:11 AM

## 2015-11-11 NOTE — Discharge Instructions (Signed)
May shower.  Keep JP drain site clean, apply Neosporin after shower around the tubing

## 2015-11-13 LAB — LIPASE, FLUID: Lipase-Fluid: 539 U/L

## 2015-11-22 ENCOUNTER — Telehealth: Payer: Self-pay | Admitting: Oncology

## 2015-11-22 ENCOUNTER — Other Ambulatory Visit: Payer: Self-pay | Admitting: *Deleted

## 2015-11-22 ENCOUNTER — Ambulatory Visit (HOSPITAL_BASED_OUTPATIENT_CLINIC_OR_DEPARTMENT_OTHER): Payer: 59 | Admitting: Oncology

## 2015-11-22 VITALS — BP 110/55 | HR 89 | Temp 98.2°F | Resp 18 | Ht 70.0 in | Wt 177.0 lb

## 2015-11-22 DIAGNOSIS — R911 Solitary pulmonary nodule: Secondary | ICD-10-CM

## 2015-11-22 DIAGNOSIS — C786 Secondary malignant neoplasm of retroperitoneum and peritoneum: Secondary | ICD-10-CM | POA: Diagnosis not present

## 2015-11-22 DIAGNOSIS — C184 Malignant neoplasm of transverse colon: Secondary | ICD-10-CM

## 2015-11-22 DIAGNOSIS — C19 Malignant neoplasm of rectosigmoid junction: Secondary | ICD-10-CM

## 2015-11-22 DIAGNOSIS — I10 Essential (primary) hypertension: Secondary | ICD-10-CM

## 2015-11-22 DIAGNOSIS — D509 Iron deficiency anemia, unspecified: Secondary | ICD-10-CM

## 2015-11-22 DIAGNOSIS — R630 Anorexia: Secondary | ICD-10-CM

## 2015-11-22 MED ORDER — MEGESTROL ACETATE 400 MG/10ML PO SUSP: 200.0000 mg | Freq: Two times a day (BID) | ORAL | 0 refills | Status: DC

## 2015-11-22 MED ORDER — MEGESTROL ACETATE 400 MG/10ML PO SUSP: 200.0000 mg | Freq: Every day | ORAL | 0 refills | Status: DC

## 2015-11-22 NOTE — Telephone Encounter (Signed)
Appointments scheduled per 11/22/15 los. AVS and appointment schedule was given to patient,per 11/22/15 los.

## 2015-11-22 NOTE — Progress Notes (Signed)
[facility] OFFICE PROGRESS NOTE   Diagnosis: Colon cancer  INTERVAL HISTORY:   Cody Suarez returns as scheduled. He underwent resection of the mesenteric mass and tell the pancreas on 11/05/2015 by Dr. Hassell Done. No additional tumor was seen. The pathology revealed metastatic adenocarcinoma the colon. Stapled resection margins were negative. Adenocarcinoma focally involved the serosal surface of the specimen.  He complains of anorexia. No pain, occasional mild nausea, no emesis. No difficulty with bowel function. He saw Dr. Hassell Done yesterday.    Objective:  Vital signs in last 24 hours:  Blood pressure (!) 110/55, pulse 89, temperature 98.2 F (36.8 C), temperature source Oral, resp. rate 18, height _0  (1.778 m), weight 177 lb (80.3 kg), SpO2 98 %.    HEENT: No thrush, neck without mass Lymphatics: No cervical, supra-clavicular, axillary, or inguinal nodes Resp: Lungs clear bilaterally Cardio: Regular rate and rhythm GI: Healed midline incision with Steri-Strips in place, no hepatosplenomegaly, nontender, mild erythema at the left lower abdomen drain site Vascular: No leg edema    Medications: I have reviewed the patient's current medications.  Assessment/Plan: 1. 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 Negative for a BRAF mutation  Tumor invaded through the muscularis propria into pericolonic fatty tissue and involved the attached omentum.   Cycle 1 adjuvant Xeloda 05/28/2013.   Cycle 2 adjuvant Xeloda 06/18/2013.   Cycle 3 adjuvant Xeloda 07/09/2013.   Cycle 4 adjuvant Xeloda 07/30/2013.   Cycle 5 adjuvant Xeloda 08/20/2013.  Cycle 6 adjuvant Xeloda 09/11/2013.  cycle 7 adjuvant Xeloda 10/02/2013  Cycle 8 adjuvant Xeloda 10/23/2013  CTs of the chest, abdomen,  and pelvis on 07/18/2014-negative for recurrent colon cancer   CTs abdomen/pelvis 09/06/2014 showed acute cholecystitis.  Colonoscopy 08/02/2015-chronic colitis, no malignancy  CTs 09/17/2015, new mass near the tail the pancreas, stable lung nodules  Biopsy mesenteric mass 10/04/2015 with metastatic adenocarcinoma consistent with colorectal primary  PET scan 10/10/2015 with soft tissue thickening within the peritoneal space of the left upper quadrant with SUV Max 7.9; no additional sites of peritoneal nodularity to suggest metastasis; hypermetabolic ill-defined nodule in the left lower lobe favored inflammatory.  Resection of the left abdominal mesenteric mass/tail the pancreas on 11/05/2015 with the pathology confirming metastatic colon cancer, positive "serosal "margin 2. Ulcerative colitis-extensive chronic active ulcerative colitis was noted on the colon resection specimen 03/28/2013.  Followed by Dr.Magod 3. Hypertension.  4. History of microcytic anemia-likely iron deficiency, unable to tolerate oral iron. Improved. 5. Possible area of cecal wall thickening noted on abdominal CT 03/20/2013. 6. Family history of colon cancer (maternal grandfather died in his 32s with colon cancer). 7. Bilateral calf and low anterior leg pain. Bilateral lower extremity venous duplex negative for DVT 07/14/2013. Right ABI with moderate, borderline severe arterial insufficiency; left ABI suggestive of moderate arterial insufficiency. He was referred to vascular. 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 TEE showed thrombus in the descending aorta. He underwent right popliteal to peroneal artery bypass graft, thrombectomy anterior tibialis and attempted thrombectomy tibioperoneal trunk and posterior tibialis on 08/11/2013. Right calf pain 09/26/2013 with findings of occlusion of right popliteal to peroneal bypass status post thrombolysis. Now on anticoagulation with  Xarelto.  recurrent pain and ulceration at the right foot, status post a popliteal to posterior tibial artery bypass using a cephalic vein graft on 62/13/0865 8. Status post laparoscopic cholecystectomy  09/08/2014 9. Anorexia following the mesenteric mass/pancreas tail resection   Disposition:  CodySuarez is recovering from surgery. Resection of the mesenteric mass confirmed recurrence of colon cancer. No additional evidence of metastatic disease. I discussed the pathology and treatment options with Mr. Bedrosian and his wife. I do not recommend adjuvant chemotherapy. I will discuss the case with radiation oncology, but I doubt there is a role for radiation with a negative margin.  He is recovering from surgery. He continues to have anorexia. He requested an appetite stimulant. He will begin a trial of Megace. We reviewed the potential side effects associated with Megace including the increased risk for thrombosis and fluid retention.  He will return for an office visit next month.  11/22/2015  9:17 AM

## 2015-11-26 ENCOUNTER — Emergency Department (HOSPITAL_COMMUNITY): Payer: 59

## 2015-11-26 ENCOUNTER — Observation Stay (HOSPITAL_COMMUNITY): Payer: 59

## 2015-11-26 ENCOUNTER — Encounter (HOSPITAL_COMMUNITY): Payer: Self-pay

## 2015-11-26 ENCOUNTER — Inpatient Hospital Stay (HOSPITAL_COMMUNITY)
Admission: EM | Admit: 2015-11-26 | Discharge: 2015-11-30 | DRG: 371 | Disposition: A | Discharge status: Home / Self Care | Payer: 59 | Attending: Emergency Medicine | Admitting: Emergency Medicine

## 2015-11-26 DIAGNOSIS — C786 Secondary malignant neoplasm of retroperitoneum and peritoneum: Secondary | ICD-10-CM | POA: Diagnosis present

## 2015-11-26 DIAGNOSIS — T814XXA Infection following a procedure, initial encounter: Secondary | ICD-10-CM | POA: Diagnosis not present

## 2015-11-26 DIAGNOSIS — C184 Malignant neoplasm of transverse colon: Secondary | ICD-10-CM | POA: Diagnosis present

## 2015-11-26 DIAGNOSIS — C185 Malignant neoplasm of splenic flexure: Secondary | ICD-10-CM | POA: Diagnosis present

## 2015-11-26 DIAGNOSIS — Z7901 Long term (current) use of anticoagulants: Secondary | ICD-10-CM

## 2015-11-26 DIAGNOSIS — M6281 Muscle weakness (generalized): Secondary | ICD-10-CM

## 2015-11-26 DIAGNOSIS — T8143XA Infection following a procedure, organ and space surgical site, initial encounter: Secondary | ICD-10-CM | POA: Diagnosis present

## 2015-11-26 DIAGNOSIS — Z9049 Acquired absence of other specified parts of digestive tract: Secondary | ICD-10-CM

## 2015-11-26 DIAGNOSIS — Z90411 Acquired partial absence of pancreas: Secondary | ICD-10-CM

## 2015-11-26 DIAGNOSIS — I739 Peripheral vascular disease, unspecified: Secondary | ICD-10-CM | POA: Diagnosis present

## 2015-11-26 DIAGNOSIS — Z825 Family history of asthma and other chronic lower respiratory diseases: Secondary | ICD-10-CM

## 2015-11-26 DIAGNOSIS — T8149XA Infection following a procedure, other surgical site, initial encounter: Secondary | ICD-10-CM

## 2015-11-26 DIAGNOSIS — K651 Peritoneal abscess: Secondary | ICD-10-CM | POA: Diagnosis not present

## 2015-11-26 DIAGNOSIS — B9561 Methicillin susceptible Staphylococcus aureus infection as the cause of diseases classified elsewhere: Secondary | ICD-10-CM | POA: Diagnosis present

## 2015-11-26 DIAGNOSIS — D638 Anemia in other chronic diseases classified elsewhere: Secondary | ICD-10-CM | POA: Diagnosis present

## 2015-11-26 DIAGNOSIS — C189 Malignant neoplasm of colon, unspecified: Secondary | ICD-10-CM | POA: Diagnosis present

## 2015-11-26 DIAGNOSIS — I10 Essential (primary) hypertension: Secondary | ICD-10-CM | POA: Diagnosis present

## 2015-11-26 DIAGNOSIS — C7889 Secondary malignant neoplasm of other digestive organs: Secondary | ICD-10-CM | POA: Diagnosis present

## 2015-11-26 DIAGNOSIS — R52 Pain, unspecified: Secondary | ICD-10-CM | POA: Diagnosis not present

## 2015-11-26 DIAGNOSIS — Z9889 Other specified postprocedural states: Secondary | ICD-10-CM

## 2015-11-26 DIAGNOSIS — Z85048 Personal history of other malignant neoplasm of rectum, rectosigmoid junction, and anus: Secondary | ICD-10-CM

## 2015-11-26 DIAGNOSIS — Z87442 Personal history of urinary calculi: Secondary | ICD-10-CM | POA: Diagnosis present

## 2015-11-26 DIAGNOSIS — R0781 Pleurodynia: Secondary | ICD-10-CM | POA: Diagnosis not present

## 2015-11-26 DIAGNOSIS — A4901 Methicillin susceptible Staphylococcus aureus infection, unspecified site: Secondary | ICD-10-CM | POA: Diagnosis present

## 2015-11-26 DIAGNOSIS — E875 Hyperkalemia: Secondary | ICD-10-CM | POA: Diagnosis present

## 2015-11-26 DIAGNOSIS — E43 Unspecified severe protein-calorie malnutrition: Secondary | ICD-10-CM | POA: Diagnosis present

## 2015-11-26 DIAGNOSIS — Z8249 Family history of ischemic heart disease and other diseases of the circulatory system: Secondary | ICD-10-CM

## 2015-11-26 LAB — I-STAT CHEM 8, ED
BUN: 13 mg/dL (ref 6–20)
CHLORIDE: 99 mmol/L — AB (ref 101–111)
Calcium, Ion: 1.09 mmol/L — ABNORMAL LOW (ref 1.15–1.40)
Creatinine, Ser: 1.1 mg/dL (ref 0.61–1.24)
GLUCOSE: 136 mg/dL — AB (ref 65–99)
HEMATOCRIT: 40 % (ref 39.0–52.0)
HEMOGLOBIN: 13.6 g/dL (ref 13.0–17.0)
POTASSIUM: 4.3 mmol/L (ref 3.5–5.1)
SODIUM: 135 mmol/L (ref 135–145)
TCO2: 26 mmol/L (ref 0–100)

## 2015-11-26 LAB — COMPREHENSIVE METABOLIC PANEL
ALT: 21 U/L (ref 17–63)
ANION GAP: 9 (ref 5–15)
AST: 26 U/L (ref 15–41)
Albumin: 3.1 g/dL — ABNORMAL LOW (ref 3.5–5.0)
Alkaline Phosphatase: 68 U/L (ref 38–126)
BILIRUBIN TOTAL: 0.4 mg/dL (ref 0.3–1.2)
BUN: 13 mg/dL (ref 6–20)
CHLORIDE: 100 mmol/L — AB (ref 101–111)
CO2: 25 mmol/L (ref 22–32)
Calcium: 9 mg/dL (ref 8.9–10.3)
Creatinine, Ser: 1.24 mg/dL (ref 0.61–1.24)
GFR, EST NON AFRICAN AMERICAN: 57 mL/min — AB (ref >60)
Glucose, Bld: 139 mg/dL — ABNORMAL HIGH (ref 65–99)
POTASSIUM: 4.2 mmol/L (ref 3.5–5.1)
Sodium: 134 mmol/L — ABNORMAL LOW (ref 135–145)
TOTAL PROTEIN: 8 g/dL (ref 6.5–8.1)

## 2015-11-26 LAB — I-STAT TROPONIN, ED: TROPONIN I, POC: 0 ng/mL (ref 0.00–0.08)

## 2015-11-26 LAB — URINALYSIS, ROUTINE W REFLEX MICROSCOPIC
BILIRUBIN URINE: NEGATIVE
Glucose, UA: NEGATIVE mg/dL
Hgb urine dipstick: NEGATIVE
KETONES UR: NEGATIVE mg/dL
Leukocytes, UA: NEGATIVE
NITRITE: NEGATIVE
PH: 5.5 (ref 5.0–8.0)
PROTEIN: NEGATIVE mg/dL
Specific Gravity, Urine: >1.046 — ABNORMAL HIGH (ref 1.005–1.030)

## 2015-11-26 LAB — CBC WITH DIFFERENTIAL/PLATELET
BASOS ABS: 0.1 10*3/uL (ref 0.0–0.1)
Basophils Relative: 0 %
EOS PCT: 1 %
Eosinophils Absolute: 0.2 10*3/uL (ref 0.0–0.7)
HEMATOCRIT: 36.7 % — AB (ref 39.0–52.0)
Hemoglobin: 12.4 g/dL — ABNORMAL LOW (ref 13.0–17.0)
LYMPHS PCT: 8 %
Lymphs Abs: 1.9 10*3/uL (ref 0.7–4.0)
MCH: 29.3 pg (ref 26.0–34.0)
MCHC: 33.8 g/dL (ref 30.0–36.0)
MCV: 86.8 fL (ref 78.0–100.0)
MONO ABS: 2.2 10*3/uL — AB (ref 0.1–1.0)
MONOS PCT: 10 %
NEUTROS ABS: 18.4 10*3/uL — AB (ref 1.7–7.7)
Neutrophils Relative %: 81 %
PLATELETS: 731 10*3/uL — AB (ref 150–400)
RBC: 4.23 MIL/uL (ref 4.22–5.81)
RDW: 14.9 % (ref 11.5–15.5)
WBC: 22.8 10*3/uL — ABNORMAL HIGH (ref 4.0–10.5)

## 2015-11-26 LAB — PROTIME-INR
INR: 1.59
Prothrombin Time: 19.1 seconds — ABNORMAL HIGH (ref 11.4–15.2)

## 2015-11-26 LAB — LIPASE, BLOOD: LIPASE: 27 U/L (ref 11–51)

## 2015-11-26 MED ORDER — PIPERACILLIN-TAZOBACTAM 3.375 G IVPB
3.3750 g | Freq: Three times a day (TID) | INTRAVENOUS | Status: DC
  Administered 2015-11-26 – 2015-11-29 (×9): 3.375 g via INTRAVENOUS
  Filled 2015-11-26 (×10): qty 50

## 2015-11-26 MED ORDER — MIDAZOLAM HCL 2 MG/2ML IJ SOLN
INTRAMUSCULAR | Status: AC
  Filled 2015-11-26: qty 4

## 2015-11-26 MED ORDER — MEPERIDINE HCL 100 MG/ML IJ SOLN
INTRAMUSCULAR | Status: AC
  Filled 2015-11-26: qty 1

## 2015-11-26 MED ORDER — VANCOMYCIN HCL IN DEXTROSE 1-5 GM/200ML-% IV SOLN
1000.0000 mg | Freq: Once | INTRAVENOUS | Status: AC
  Administered 2015-11-26: 1000 mg via INTRAVENOUS
  Filled 2015-11-26: qty 200

## 2015-11-26 MED ORDER — SIMETHICONE 80 MG PO CHEW
40.0000 mg | CHEWABLE_TABLET | Freq: Four times a day (QID) | ORAL | Status: DC | PRN
  Filled 2015-11-26: qty 1

## 2015-11-26 MED ORDER — HYDROCODONE-ACETAMINOPHEN 5-325 MG PO TABS
1.0000 | ORAL_TABLET | ORAL | Status: DC | PRN
  Administered 2015-11-27 – 2015-11-30 (×7): 2 via ORAL
  Filled 2015-11-26 (×7): qty 2

## 2015-11-26 MED ORDER — MEPERIDINE HCL 25 MG/ML IJ SOLN
INTRAMUSCULAR | Status: AC | PRN
  Administered 2015-11-26: 25 mg via INTRAVENOUS

## 2015-11-26 MED ORDER — ONDANSETRON HCL 4 MG/2ML IJ SOLN
INTRAMUSCULAR | Status: AC | PRN
  Administered 2015-11-26: 4 mg via INTRAVENOUS

## 2015-11-26 MED ORDER — FENTANYL CITRATE (PF) 100 MCG/2ML IJ SOLN
INTRAMUSCULAR | Status: AC
  Filled 2015-11-26: qty 4

## 2015-11-26 MED ORDER — HYDROMORPHONE HCL 1 MG/ML IJ SOLN
0.5000 mg | INTRAMUSCULAR | Status: DC | PRN
  Administered 2015-11-26 – 2015-11-27 (×4): 1 mg via INTRAVENOUS
  Administered 2015-11-27: 0.5 mg via INTRAVENOUS
  Administered 2015-11-27 – 2015-11-30 (×12): 1 mg via INTRAVENOUS
  Filled 2015-11-26 (×17): qty 1

## 2015-11-26 MED ORDER — PIPERACILLIN-TAZOBACTAM 3.375 G IVPB 30 MIN
3.3750 g | Freq: Once | INTRAVENOUS | Status: AC
  Administered 2015-11-26: 3.375 g via INTRAVENOUS
  Filled 2015-11-26: qty 50

## 2015-11-26 MED ORDER — ENOXAPARIN SODIUM 40 MG/0.4ML ~~LOC~~ SOLN
40.0000 mg | SUBCUTANEOUS | Status: DC
  Administered 2015-11-26 – 2015-11-29 (×4): 40 mg via SUBCUTANEOUS
  Filled 2015-11-26 (×4): qty 0.4

## 2015-11-26 MED ORDER — ONDANSETRON HCL 4 MG/2ML IJ SOLN
4.0000 mg | Freq: Four times a day (QID) | INTRAMUSCULAR | Status: DC | PRN
  Administered 2015-11-26 – 2015-11-30 (×5): 4 mg via INTRAVENOUS
  Filled 2015-11-26 (×6): qty 2

## 2015-11-26 MED ORDER — HYDRALAZINE HCL 20 MG/ML IJ SOLN: 10.0000 mg | INTRAMUSCULAR | Status: DC | PRN

## 2015-11-26 MED ORDER — SODIUM CHLORIDE 0.9 % IJ SOLN
INTRAMUSCULAR | Status: AC
  Filled 2015-11-26: qty 50

## 2015-11-26 MED ORDER — DIPHENHYDRAMINE HCL 50 MG/ML IJ SOLN: 12.5000 mg | Freq: Four times a day (QID) | INTRAMUSCULAR | Status: DC | PRN

## 2015-11-26 MED ORDER — MIDAZOLAM HCL 2 MG/2ML IJ SOLN
INTRAMUSCULAR | Status: AC | PRN
  Administered 2015-11-26: 1 mg via INTRAVENOUS

## 2015-11-26 MED ORDER — HYDROMORPHONE HCL 1 MG/ML IJ SOLN
1.0000 mg | Freq: Once | INTRAMUSCULAR | Status: AC
  Administered 2015-11-26: 1 mg via INTRAVENOUS
  Filled 2015-11-26: qty 1

## 2015-11-26 MED ORDER — FENTANYL CITRATE (PF) 100 MCG/2ML IJ SOLN
50.0000 ug | Freq: Once | INTRAMUSCULAR | Status: AC
  Administered 2015-11-26: 50 ug via INTRAVENOUS
  Filled 2015-11-26: qty 2

## 2015-11-26 MED ORDER — ONDANSETRON 4 MG PO TBDP: 4.0000 mg | ORAL_TABLET | Freq: Four times a day (QID) | ORAL | Status: DC | PRN

## 2015-11-26 MED ORDER — LOSARTAN POTASSIUM 50 MG PO TABS
100.0000 mg | ORAL_TABLET | Freq: Every day | ORAL | Status: DC
  Administered 2015-11-27 – 2015-11-30 (×3): 100 mg via ORAL
  Filled 2015-11-26 (×3): qty 2

## 2015-11-26 MED ORDER — FENTANYL CITRATE (PF) 100 MCG/2ML IJ SOLN
INTRAMUSCULAR | Status: AC | PRN
  Administered 2015-11-26: 25 ug via INTRAVENOUS

## 2015-11-26 MED ORDER — DIPHENHYDRAMINE HCL 12.5 MG/5ML PO ELIX: 12.5000 mg | ORAL_SOLUTION | Freq: Four times a day (QID) | ORAL | Status: DC | PRN

## 2015-11-26 MED ORDER — POLYETHYLENE GLYCOL 3350 17 G PO PACK: 17.0000 g | PACK | Freq: Every day | ORAL | Status: DC | PRN

## 2015-11-26 MED ORDER — CARVEDILOL 6.25 MG PO TABS
6.2500 mg | ORAL_TABLET | Freq: Two times a day (BID) | ORAL | Status: DC
  Administered 2015-11-27 – 2015-11-30 (×5): 6.25 mg via ORAL
  Filled 2015-11-26 (×7): qty 1

## 2015-11-26 MED ORDER — KCL IN DEXTROSE-NACL 20-5-0.45 MEQ/L-%-% IV SOLN
INTRAVENOUS | Status: DC
  Administered 2015-11-26 – 2015-11-27 (×2): via INTRAVENOUS
  Filled 2015-11-26 (×4): qty 1000

## 2015-11-26 MED ORDER — IOPAMIDOL (ISOVUE-370) INJECTION 76%
100.0000 mL | Freq: Once | INTRAVENOUS | Status: AC | PRN
  Administered 2015-11-26: 100 mL via INTRAVENOUS

## 2015-11-26 MED ORDER — ONDANSETRON HCL 4 MG/2ML IJ SOLN
INTRAMUSCULAR | Status: AC
  Filled 2015-11-26: qty 2

## 2015-11-26 MED ORDER — IOPAMIDOL (ISOVUE-370) INJECTION 76%
INTRAVENOUS | Status: AC
  Filled 2015-11-26: qty 100

## 2015-11-26 NOTE — Sedation Documentation (Signed)
At the end of procedure pt experiencing pain and nausea.  Demerol and Zofran given.  Pt resting comfortable now

## 2015-11-26 NOTE — Consult Note (Signed)
Chief Complaint: Patient was seen in consultation today for percutaneous left abdominal drain placement   Referring Physician(s): Dr. [name]  Supervising Physician: Corrie Mckusick  Patient Status: [facility] - In-pt   History of Present Illness: Cody Suarez is a 70 y.o. male with PMH significant for ulcerative colitis, recurrent colon cancer, arterial thromboembolus, PVD, HTN, and anemia of chronic disease who is s/p laparoscopic right hemicolectomy in (2015) for colon cancer, cholecystectomy in 2016, and more recently,  exploratory laparotomy with enterolysis, excision of mesenteric metastasis, and distal pancreatectomy (Dr. Hassell Done) for recurrent adenocarcinoma of the colon on 11/05/2015.   He presented to the ED 11/21 with LUQ/rib pain which has progressively worsened over the last 3 days.  Pain is associated with decreased appetite and nausea. Patient seen by CCS who determined patient to have intraabdominal fluid collection; confirmed by CT.   CT Abdomen and Pelvis (11/21) IMPRESSION: 1. No evidence of significant pulmonary embolus. 2. Bilateral pleural effusions with basilar atelectasis or consolidation, greater on the left. 3. Large fluid collection in the left upper quadrant with adjacent stranding and small subdiaphragmatic fluid. This likely represents either abscess or pseudocyst related to breakdown of pancreatic margin of resection. 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 4. Nonobstructing intrarenal stones in the right kidney.  CCS requesting percutaneous drain placement for large fluid collection.  Patient is on Xarelto, last dose was yesterday morning (11/20).  He is currently NPO with last intake being yesterday evening.  Most recent PT-INR was 1.04 (10/31)- will re-draw. Platelets noted.   Past Medical History:  Diagnosis Date  . Anemia of chronic disease 2015   "related to cancer tx"  (08/09/2013)  . Blood clot in vein 2015  . Colon cancer ([place]) 03/2013  . History of kidney stones    x 2passed them both  . History of kidney stones    x 2 passed them both  . Hypertension   . Peripheral vascular disease ([place])   . Ulcerative colitis ([place])    history    Past Surgical History:  Procedure Laterality Date  . ABDOMINAL AORTAGRAM N/A 08/09/2013   Procedure: ABDOMINAL Maxcine Ham;  Surgeon: [name], MD;  Location: [place] CATH [facility];  Service: Cardiovascular;  Laterality: N/A;  . CHOLECYSTECTOMY N/A 09/08/2014   Procedure: LAPAROSCOPIC CHOLECYSTECTOMY WITH INTRAOPERATIVE CHOLANGIOGRAM;  Surgeon: Armandina Gemma, MD;  Location: [facility];  Service: General;  Laterality: N/A;  . COLON SURGERY    . FEMORAL-POPLITEAL BYPASS GRAFT Right 08/11/2013   Procedure:   RIGHT - POPLITEAL TO PERONEAL ARTERY BYPASS GRAFT  WITH NONREVERSED SAPHENOUS VEIN GRAFT,tHROMBECTOMY ANTERIOR TIBIALIS,ATTEMPTED THROMBECTOMY TIBIO-PERONEAL TRUNK AND POSTERIOR TIBIALIS, INTRAOPERATIVE ARTERIOGRAM.;  Surgeon: Mal Misty, MD;  Location: [facility];  Service: Vascular;  Laterality: Right;  . FEMORAL-TIBIAL BYPASS GRAFT Right 11/17/2013   Procedure: BYPASS GRAFT RIGHT ABOVE KNEE POPLITEAL TO POSTERIOR TIBIAL ARTERY USING RIGHT NON-REVERSED CEPHALIC VEIN;  Surgeon: Mal Misty, MD;  Location: [place];  Service: Vascular;  Laterality: Right;  . HERNIA REPAIR  ~ 1995; 03/2013   UHR  . INTRAOPERATIVE ARTERIOGRAM Right 11/17/2013   Procedure: INTRA OPERATIVE ARTERIOGRAM;  Surgeon: Mal Misty, MD;  Location: [place];  Service: Vascular;  Laterality: Right;  . LAPAROSCOPIC RIGHT HEMI COLECTOMY N/A 03/28/2013   Procedure: LAPAROSCOPIC ASSISTED HEMI COLECTOMY;  Surgeon: Pedro Earls, MD;  Location: [facility];  Service: General;  Laterality: N/A;  . LAPAROSCOPY N/A 11/05/2015  Procedure: LAPAROSCOPY DIAGNOSTIC;  Surgeon: [name], MD;  Location: [facility];  Service: General;  Laterality: N/A;  . LAPAROTOMY N/A 11/05/2015    Procedure: EXPLORATORY LAPAROTOMY, resection of mass at tail of pancreas;  Surgeon: [name], MD;  Location: [facility];  Service: General;  Laterality: N/A;  . LOWER EXTREMITY ANGIOGRAM Bilateral 08/09/2013  . TEE WITHOUT CARDIOVERSION N/A 08/10/2013   Procedure: TRANSESOPHAGEAL ECHOCARDIOGRAM (TEE);  Surgeon: Candee Furbish, MD;  Location: [facility] ENDOSCOPY;  Service: Cardiovascular;  Laterality: N/A;  . TONSILLECTOMY  ~ 1950  . [place]; 03/2013  . VASECTOMY    . VEIN HARVEST Right 11/17/2013   Procedure: HARVEST OF RIGHT UPPER EXTREMITY CEPHALIC VEIN;  Surgeon: Mal Misty, MD;  Location: [place];  Service: Vascular;  Laterality: Right;    Allergies: Amlodipine and Gabapentin  Medications: Prior to Admission medications   Medication Sig Start Date End Date Taking? Authorizing Provider  budesonide (ENTOCORT EC) 3 MG 24 hr capsule Take 6 mg by mouth daily with breakfast.    Yes Historical Provider, MD  carvedilol (COREG) 12.5 MG tablet Take 6.25 mg by mouth 2 (two) times daily with a meal.   Yes Historical Provider, MD  diphenhydramine-acetaminophen (TYLENOL PM) 25-500 MG TABS Take 1-2 tablets by mouth at bedtime. Reported on 01/22/2015   Yes Historical Provider, MD  HYDROcodone-acetaminophen (NORCO/VICODIN) 5-325 MG tablet Take 1-2 tablets by mouth every 4 (four) hours as needed for moderate pain. 11/11/15  Yes [name], MD  losartan (COZAAR) 100 MG tablet Take 1 tablet (100 mg total) by mouth daily. Patient taking differently: Take 100 mg by mouth daily with breakfast.  02/06/15  Yes [name], MD  megestrol (MEGACE) 400 MG/10ML suspension Take 5 mLs (200 mg total) by mouth 2 (two) times daily. 11/22/15  Yes [name], MD  Multiple Vitamin (MULTIVITAMIN WITH MINERALS) TABS tablet Take 1 tablet by mouth daily.   Yes Historical Provider, MD  ondansetron (ZOFRAN-ODT) 4 MG disintegrating tablet Take 4 mg by mouth every 8 (eight) hours as needed for nausea or  vomiting.  11/15/15  Yes Historical Provider, MD  spironolactone (ALDACTONE) 25 MG tablet TAKE 1 TABLET DAILY Patient taking differently: TAKE 1 TABLET by mouth in the morning 08/12/15  Yes [name], MD  VIAGRA 50 MG tablet Take 50 mg by mouth daily as needed for erectile dysfunction.  08/16/15  Yes Historical Provider, MD  XARELTO 20 MG TABS tablet TAKE 1 TABLET DAILY WITH BREAKFAST 08/15/15  Yes [name], MD  carvedilol (COREG) 6.25 MG tablet Take 1 tablet (6.25 mg total) by mouth 2 (two) times daily. Patient not taking: Reported on 11/26/2015 08/12/15   [name], MD     Family History  Problem Relation Age of Onset  . Heart disease Mother   . Emphysema Father     Social History   Social History  . Marital status: Married    Spouse name: N/A  . Number of children: N/A  . Years of education: N/A   Social History Main Topics  . Smoking status: Never Smoker  . Smokeless tobacco: Never Used     Comment: states his father died of emphysema from smoking, he did not want to be like that  . Alcohol use 1.8 oz/week    3 Glasses of wine per week     Comment: occasionally  . Drug use: No  . Sexual activity: Yes   Other Topics Concern  .  None   Social History Narrative  . None      Review of Systems  Constitutional: Positive for activity change and appetite change. Negative for fever.  Cardiovascular: Negative for chest pain.  Gastrointestinal: Positive for abdominal pain. Negative for abdominal distention, nausea and vomiting.  Psychiatric/Behavioral: Negative for behavioral problems and confusion.    Vital Signs: BP 141/76   Pulse 93   Temp 97.8 F (36.6 C) (Oral)   Resp 18   SpO2 97%   Physical Exam  Constitutional: He is oriented to person, place, and time. He appears well-developed and well-nourished.  Cardiovascular: Normal rate, regular rhythm and normal heart sounds.   Pulmonary/Chest: Effort normal. No respiratory distress.  Increased pain  with inspiration, decreased breath sounds at bases- greater on left  Abdominal: Bowel sounds are normal. He exhibits distension. There is tenderness.  Tenderness in LUQ Laparotomy wound with intact steristrips, dressing in left lower quadrant from previous drain  Musculoskeletal: He exhibits no edema.  Neurological: He is alert and oriented to person, place, and time.  Skin: Skin is warm and dry.  Psychiatric: He has a normal mood and affect. His behavior is normal. Judgment and thought content normal.  Nursing note and vitals reviewed.   Mallampati Score:  MD Evaluation Airway: WNL Heart: WNL Abdomen: WNL Chest/ Lungs: WNL ASA  Classification: 3 Mallampati/Airway Score: Two  Imaging: Dg Chest 2 View  Result Date: 11/09/2015 CLINICAL DATA:  [place]. Hx colon cancer, htn, PVD, nonsmoker EXAM: CHEST  2 VIEW COMPARISON:  09/06/2014 and 09/26/2013 FINDINGS: Lung volumes are relatively low. Mild streaky opacity at the lung bases likely atelectasis. No convincing pneumonia. No pulmonary edema. No pleural effusion or pneumothorax. Cardiac silhouette is normal in size. No mediastinal or hilar masses or convincing adenopathy. Skeletal structures are intact. IMPRESSION: No active cardiopulmonary disease. Electronically Signed   By: Lajean Manes M.D.   On: 11/09/2015 08:32   Ct Angio Chest Pe W And/or Wo Contrast  Result Date: 11/26/2015 CLINICAL DATA:  Sudden onset left lower rib pain after surgery to remove a metastatic mass in the tail of the pancreas on 11/05/2015. Metastatic colon cancer. Previous right hemicolectomy. History of hypertension, ulcerative colitis, umbilical hernia repair, cholecystectomy. EXAM: CT ANGIOGRAPHY CHEST CT ABDOMEN AND PELVIS WITH CONTRAST TECHNIQUE: Multidetector CT imaging of the chest was performed using the standard protocol during bolus administration of intravenous contrast. Multiplanar CT image reconstructions and MIPs were obtained to evaluate the vascular  anatomy. Multidetector CT imaging of the abdomen and pelvis was performed using the standard protocol during bolus administration of intravenous contrast. CONTRAST:  100 mL Isovue 370 COMPARISON:  CT chest abdomen and pelvis 09/17/2015. PET-CT scan 10/10/2015. FINDINGS: CTA CHEST FINDINGS Cardiovascular: Satisfactory opacification of the pulmonary arteries to the segmental level. No evidence of pulmonary embolism. Normal heart size. No pericardial effusion. Aortic atherosclerosis. No aortic aneurysm or dissection. No pericardial effusion. Mediastinum/Nodes: No enlarged mediastinal, hilar, or axillary lymph nodes. Thyroid gland, trachea, and esophagus demonstrate no significant findings. Small esophageal hiatal hernia. Lungs/Pleura: Small bilateral pleural effusions with basilar atelectasis, greater on the right. No pneumothorax. Airways are patent. Musculoskeletal: Degenerative changes in the spine. No destructive bone lesions. Review of the MIP images confirms the above findings. CT ABDOMEN and PELVIS FINDINGS Hepatobiliary: No focal liver abnormality is seen. Status post cholecystectomy. No biliary dilatation. Mild diffuse fatty infiltration of the liver. Pancreas: Postoperative changes with resection of the pancreatic tail. Normal enhancement of the remaining pancreatic tissue without ductal dilatation. There  is a circumscribed fluid collection with thin enhancing wall arising in the left upper quadrant adjacent to the pancreatic tail at the margin of resection. The collection measures about 6.7 x 9.8 x 9.9 cm. There is surrounding infiltration in the left upper quadrant fat with a small amount of free fluid under the left hemidiaphragm. There appears to be a fistula extending from a fluid collection along the left flank down to the level of the the iliac crests, where it extends through the rectus abdominus muscle with small fluid collection and with scarring extending to the skin surface. This may represent a  cutaneous fistula and is likely corresponding to the site of a previous surgical drain. This fluid collection could represent an abscess or a pseudo cyst relating to breakdown of the pancreatic margin of resection. Postoperative fluid collection felt less likely due to timing. Spleen: Normal in size without focal abnormality. Adrenals/Urinary Tract: 2 stones in the lower pole right kidney, largest measuring 5.6 mm diameter. No hydronephrosis. Renal nephrograms are symmetrical. Bladder wall is not thickened. Stomach/Bowel: Stomach is within normal limits. Appendix appears normal. No evidence of bowel wall thickening, distention, or inflammatory changes. Postoperative partial colectomy with anastomosis demonstrated at the mid transverse region. Vascular/Lymphatic: Aortic atherosclerosis. No enlarged abdominal or pelvic lymph nodes. Reproductive: Prostate is unremarkable. Other: No free air in the abdomen. Postoperative changes along the midline consistent with recent surgery. Musculoskeletal: Degenerative changes in the spine. No destructive bone lesions. Review of the MIP images confirms the above findings. IMPRESSION: 1. No evidence of significant pulmonary embolus. 2. Bilateral pleural effusions with basilar atelectasis or consolidation, greater on the left. 3. Large fluid collection in the left upper quadrant with adjacent stranding and small subdiaphragmatic fluid. This likely represents either abscess or pseudocyst related to breakdown of pancreatic margin of resection. 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 4. Nonobstructing intrarenal stones in the right kidney. These results were called by telephone at the time of interpretation on 11/26/2015 at 6:47 am to Dr. Veatrice Kells , who verbally acknowledged these results. Electronically Signed   By: Lucienne Capers M.D.   On: 11/26/2015 06:48   Ct Abdomen Pelvis W Contrast  Result Date:  11/26/2015 CLINICAL DATA:  Sudden onset left lower rib pain after surgery to remove a metastatic mass in the tail of the pancreas on 11/05/2015. Metastatic colon cancer. Previous right hemicolectomy. History of hypertension, ulcerative colitis, umbilical hernia repair, cholecystectomy. EXAM: CT ANGIOGRAPHY CHEST CT ABDOMEN AND PELVIS WITH CONTRAST TECHNIQUE: Multidetector CT imaging of the chest was performed using the standard protocol during bolus administration of intravenous contrast. Multiplanar CT image reconstructions and MIPs were obtained to evaluate the vascular anatomy. Multidetector CT imaging of the abdomen and pelvis was performed using the standard protocol during bolus administration of intravenous contrast. CONTRAST:  100 mL Isovue 370 COMPARISON:  CT chest abdomen and pelvis 09/17/2015. PET-CT scan 10/10/2015. FINDINGS: CTA CHEST FINDINGS Cardiovascular: Satisfactory opacification of the pulmonary arteries to the segmental level. No evidence of pulmonary embolism. Normal heart size. No pericardial effusion. Aortic atherosclerosis. No aortic aneurysm or dissection. No pericardial effusion. Mediastinum/Nodes: No enlarged mediastinal, hilar, or axillary lymph nodes. Thyroid gland, trachea, and esophagus demonstrate no significant findings. Small esophageal hiatal hernia. Lungs/Pleura: Small bilateral pleural effusions with basilar atelectasis, greater on the right. No pneumothorax. Airways are patent. Musculoskeletal: Degenerative changes in the spine. No destructive bone lesions. Review of the MIP images confirms the above findings.  CT ABDOMEN and PELVIS FINDINGS Hepatobiliary: No focal liver abnormality is seen. Status post cholecystectomy. No biliary dilatation. Mild diffuse fatty infiltration of the liver. Pancreas: Postoperative changes with resection of the pancreatic tail. Normal enhancement of the remaining pancreatic tissue without ductal dilatation. There is a circumscribed fluid collection  with thin enhancing wall arising in the left upper quadrant adjacent to the pancreatic tail at the margin of resection. The collection measures about 6.7 x 9.8 x 9.9 cm. There is surrounding infiltration in the left upper quadrant fat with a small amount of free fluid under the left hemidiaphragm. There appears to be a fistula extending from a fluid collection along the left flank down to the level of the the iliac crests, where it extends through the rectus abdominus muscle with small fluid collection and with scarring extending to the skin surface. This may represent a cutaneous fistula and is likely corresponding to the site of a previous surgical drain. This fluid collection could represent an abscess or a pseudo cyst relating to breakdown of the pancreatic margin of resection. Postoperative fluid collection felt less likely due to timing. Spleen: Normal in size without focal abnormality. Adrenals/Urinary Tract: 2 stones in the lower pole right kidney, largest measuring 5.6 mm diameter. No hydronephrosis. Renal nephrograms are symmetrical. Bladder wall is not thickened. Stomach/Bowel: Stomach is within normal limits. Appendix appears normal. No evidence of bowel wall thickening, distention, or inflammatory changes. Postoperative partial colectomy with anastomosis demonstrated at the mid transverse region. Vascular/Lymphatic: Aortic atherosclerosis. No enlarged abdominal or pelvic lymph nodes. Reproductive: Prostate is unremarkable. Other: No free air in the abdomen. Postoperative changes along the midline consistent with recent surgery. Musculoskeletal: Degenerative changes in the spine. No destructive bone lesions. Review of the MIP images confirms the above findings. IMPRESSION: 1. No evidence of significant pulmonary embolus. 2. Bilateral pleural effusions with basilar atelectasis or consolidation, greater on the left. 3. Large fluid collection in the left upper quadrant with adjacent stranding and small  subdiaphragmatic fluid. This likely represents either abscess or pseudocyst related to breakdown of pancreatic margin of resection. 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 4. Nonobstructing intrarenal stones in the right kidney. These results were called by telephone at the time of interpretation on 11/26/2015 at 6:47 am to Dr. Veatrice Kells , who verbally acknowledged these results. Electronically Signed   By: Lucienne Capers M.D.   On: 11/26/2015 06:48    Labs:  CBC:  Recent Labs  11/06/15 0323 11/07/15 0521 11/08/15 0433 11/26/15 0510 11/26/15 0518  WBC 18.8* 20.4* 16.1* 22.8*  --   HGB 13.8 11.6* 10.8* 12.4* 13.6  HCT 40.4 35.9* 33.0* 36.7* 40.0  PLT 260 210 230 731*  --     COAGS:  Recent Labs  10/04/15 0720 11/05/15 0523 11/26/15 1048  INR 1.00 1.04 1.59    BMP:  Recent Labs  11/06/15 0323 11/07/15 0521 11/08/15 0433 11/26/15 0510 11/26/15 0518  NA 134* 137 138 134* 135  K 5.1 4.2 3.9 4.2 4.3  CL 105 107 108 100* 99*  CO2 22 24 24 25   --   GLUCOSE 240* 152* 125* 139* 136*  BUN 18 14 10 13 13   CALCIUM 7.6* 7.6* 7.5* 9.0  --   CREATININE 1.23 0.92 0.88 1.24 1.10  GFRNONAA 58* >60 >60 57*  --   GFRAA >60 >60 >60 >60  --     LIVER FUNCTION TESTS:  Recent Labs  10/09/15  1434 11/06/15 0323 11/26/15 0510  BILITOT 0.4 0.8 0.4  AST 18 34 26  ALT 19 26 21   ALKPHOS 42 38 68  PROT 6.8 6.4* 8.0  ALBUMIN 3.9 3.5 3.1*    TUMOR MARKERS:  Recent Labs  01/21/15 1455  CEA 2.1    Assessment and Plan:  Percutaneous left upper quadrant abdominal fluid collection  aspiration vs. Drain placement. Patient with history of colon cancer s/p exploratory laparotomy with enterolysis, excision of mesenteric metastasis, and distal pancreatectomy (Dr. Hassell Done) for recurrent adenocarcinoma of the colon on 11/05/2015. Presented to the ED with LUQ pain.  CT performed and showed large left abdominal  fluid collection.  Patient admitted by CCS.  Request made for aspiration vs. Drain placement in IR by Dr. [name].  Imaging reviewed by Dr. Corrie Mckusick.  Patient scheduled for procedure today   Thank you for this interesting consult.  I greatly enjoyed meeting DEKLYN GIBBON and look forward to participating in their care.  A copy of this report was sent to the requesting provider on this date.  Electronically Signed: Docia Barrier 11/26/2015, 11:58 AM   I spent a total of 40 Minutes in face to face in clinical consultation, greater than 50% of which was counseling/coordinating care for percutaneous abdominal aspiration vs. Drain placement for left abdomen fluid/infection collection.

## 2015-11-26 NOTE — H&P (Signed)
[facility] Admission Note  Cody Suarez 04/10/1945  [id number].    Requesting MD: Randal Buba, MD Chief Complaint/Reason for Consult: intraabdominal fluid collection   HPI:  70 y/o male with PMH significant for ulcerative colitis, umbilical hernia repair, cholecystectomy, arterial thromboembolism, PVD, HTN, recurrent colorectal cancer, and anemia of chronic disease. He underwent a laparoscopic right hemicolectomy in 2015 (Dr. Hassell Done) for colon cancer and, more recently on 11/05/15, exploratory laparotomy with enterolysis, excision of mesenteric metastasis, and distal pancreatectomy (Dr. Hassell Done) for recurrent adenocarcinoma of the colon. He is followed by oncologist Dr. Benay Spice and there is currently no evidence of further metastatic disease and the patient is not undergoing adjuvant chemotherapy. He presents today with LUQ/left rib pain that is sharp, constant, and worse with inspiration. The pain started about 48-72 hours ago but become more constant over the past 24 hours. Patient also notes thick, gray drainage from the site of his previous surgical drain on Sunday 11/19, which has since subsided. Additional symptoms include decreased appetite and nausea, which have been present since his most recent operation. He endorses SOB secondary to pain. Patient denies fever, chills, chest pain, palpitations, vomiting, diarrhea, constipation, melena/hematochezia, or urinary symptoms.  ED workup:   CT scan significant for a LUQ fluid collection measuring 6.7 x 9.8 x 9.9 cm.  Leukocytosis 22.8  Lipase, LFT's, troponin WNL  Blood cultures pending  ROS: All systems reviewed and otherwise negative except for as above  Family History  Problem Relation Age of Onset  . Heart disease Mother   . Emphysema Father     Past Medical History:  Diagnosis Date  . Anemia of chronic disease 2015   "related to cancer tx" (08/09/2013)  . Blood clot in vein 2015  . Colon cancer ([place]) 03/2013  .  History of kidney stones    x 2passed them both  . History of kidney stones    x 2 passed them both  . Hypertension   . Peripheral vascular disease ([place])   . Ulcerative colitis ([place])    history    Past Surgical History:  Procedure Laterality Date  . ABDOMINAL AORTAGRAM N/A 08/09/2013   Procedure: ABDOMINAL Maxcine Ham;  Surgeon: [name], MD;  Location: [facility];  Service: Cardiovascular;  Laterality: N/A;  . CHOLECYSTECTOMY N/A 09/08/2014   Procedure: LAPAROSCOPIC CHOLECYSTECTOMY WITH INTRAOPERATIVE CHOLANGIOGRAM;  Surgeon: Armandina Gemma, MD;  Location: [facility];  Service: General;  Laterality: N/A;  . COLON SURGERY    . FEMORAL-POPLITEAL BYPASS GRAFT Right 08/11/2013   Procedure:   RIGHT - POPLITEAL TO PERONEAL ARTERY BYPASS GRAFT  WITH NONREVERSED SAPHENOUS VEIN GRAFT,tHROMBECTOMY ANTERIOR TIBIALIS,ATTEMPTED THROMBECTOMY TIBIO-PERONEAL TRUNK AND POSTERIOR TIBIALIS, INTRAOPERATIVE ARTERIOGRAM.;  Surgeon: Mal Misty, MD;  Location: [facility];  Service: Vascular;  Laterality: Right;  . FEMORAL-TIBIAL BYPASS GRAFT Right 11/17/2013   Procedure: BYPASS GRAFT RIGHT ABOVE KNEE POPLITEAL TO POSTERIOR TIBIAL ARTERY USING RIGHT NON-REVERSED CEPHALIC VEIN;  Surgeon: Mal Misty, MD;  Location: [facility];  Service: Vascular;  Laterality: Right;  . HERNIA REPAIR  ~ 1995; 03/2013   UHR  . INTRAOPERATIVE ARTERIOGRAM Right 11/17/2013   Procedure: INTRA OPERATIVE ARTERIOGRAM;  Surgeon: Mal Misty, MD;  Location: [facility];  Service: Vascular;  Laterality: Right;  . LAPAROSCOPIC RIGHT HEMI COLECTOMY N/A 03/28/2013   Procedure: LAPAROSCOPIC ASSISTED HEMI COLECTOMY;  Surgeon: Pedro Earls, MD;  [facility]: [facility];  Service: General;  Laterality: N/A;  . LAPAROSCOPY N/A 11/05/2015   Procedure: LAPAROSCOPY DIAGNOSTIC;  Surgeon:  [name], MD;  Location: [facility];  Service: General;  Laterality: N/A;  . LAPAROTOMY N/A 11/05/2015   Procedure: EXPLORATORY LAPAROTOMY, resection of mass at tail of pancreas;   Surgeon: [name], MD;  Location: [facility];  Service: General;  Laterality: N/A;  . LOWER EXTREMITY ANGIOGRAM Bilateral 08/09/2013  . TEE WITHOUT CARDIOVERSION N/A 08/10/2013   Procedure: TRANSESOPHAGEAL ECHOCARDIOGRAM (TEE);  Surgeon: Candee Furbish, MD;  Location: [facility];  Service: Cardiovascular;  Laterality: N/A;  . TONSILLECTOMY  ~ 1950  . [place]; 03/2013  . VASECTOMY    . VEIN HARVEST Right 11/17/2013   Procedure: HARVEST OF RIGHT UPPER EXTREMITY CEPHALIC VEIN;  Surgeon: Mal Misty, MD;  Location: [facility];  Service: Vascular;  Laterality: Right;    Social History:  reports that he has never smoked. He has never used smokeless tobacco. He reports that he drinks about 1.8 oz of alcohol per week . He reports that he does not use drugs.  Allergies:  Allergies  Allergen Reactions  . Amlodipine Swelling    Right LE edema  . Gabapentin Rash    Short-term memory decrease     (Not in a hospital admission)  Blood pressure 121/77, pulse 93, temperature 97.8 F (36.6 C), temperature source Oral, resp. rate 21, SpO2 97 %. Physical Exam: General: pleasant, ill-appearing white male  who is laying in bed in NAD HEENT: nasal cannula in place, head is normocephalic, atraumatic. Mouth is pink and dry Heart: regular, rate, and rhythm.  No obvious murmurs, gallops, or rubs noted.  Palpable pedal pulses bilaterally Lungs: mildly labored, CTABL with decreased breath sounds in bilateral lung bases Abd: soft, tender to palpation of LUQ without rebound or guarding, mild distention, +BS, laparotomy scar healing appropriately with steri strips in place. LLQ with dressing in place from previous drain, not actively draining. MS: all 4 extremities are symmetrical with no cyanosis, clubbing, or edema. Skin: warm and dry  Psych: A&Ox3 with an appropriate affect. Neuro: grossly intact, normal speech  Results for orders placed or performed during the hospital encounter of 11/26/15  (from the past 48 hour(s))  CBC with Differential     Status: Abnormal   Collection Time: 11/26/15  5:10 AM  Result Value Ref Range   WBC 22.8 (H) 4.0 - 10.5 K/uL   RBC 4.23 4.22 - 5.81 MIL/uL   Hemoglobin 12.4 (L) 13.0 - 17.0 g/dL   HCT 36.7 (L) 39.0 - 52.0 %   MCV 86.8 78.0 - 100.0 fL   MCH 29.3 26.0 - 34.0 pg   MCHC 33.8 30.0 - 36.0 g/dL   RDW 14.9 11.5 - 15.5 %   Platelets 731 (H) 150 - 400 K/uL   Neutrophils Relative % 81 %   Neutro Abs 18.4 (H) 1.7 - 7.7 K/uL   Lymphocytes Relative 8 %   Lymphs Abs 1.9 0.7 - 4.0 K/uL   Monocytes Relative 10 %   Monocytes Absolute 2.2 (H) 0.1 - 1.0 K/uL   Eosinophils Relative 1 %   Eosinophils Absolute 0.2 0.0 - 0.7 K/uL   Basophils Relative 0 %   Basophils Absolute 0.1 0.0 - 0.1 K/uL  Comprehensive metabolic panel     Status: Abnormal   Collection Time: 11/26/15  5:10 AM  Result Value Ref Range   Sodium 134 (L) 135 - 145 mmol/L   Potassium 4.2 3.5 - 5.1 mmol/L   Chloride 100 (L) 101 - 111 mmol/L   CO2 25 22 - 32  mmol/L   Glucose, Bld 139 (H) 65 - 99 mg/dL   BUN 13 6 - 20 mg/dL   Creatinine, Ser 1.24 0.61 - 1.24 mg/dL   Calcium 9.0 8.9 - 10.3 mg/dL   Total Protein 8.0 6.5 - 8.1 g/dL   Albumin 3.1 (L) 3.5 - 5.0 g/dL   AST 26 15 - 41 U/L   ALT 21 17 - 63 U/L   Alkaline Phosphatase 68 38 - 126 U/L   Total Bilirubin 0.4 0.3 - 1.2 mg/dL   GFR calc non Af Amer 57 (L) >60 mL/min   GFR calc Af Amer >60 >60 mL/min    Comment: (NOTE) The eGFR has been calculated using the CKD EPI equation. This calculation has not been validated in all clinical situations. eGFR's persistently <60 mL/min signify possible Chronic Kidney Disease.    Anion gap 9 5 - 15  Lipase, blood     Status: None   Collection Time: 11/26/15  5:10 AM  Result Value Ref Range   Lipase 27 11 - 51 U/L  I-Stat Troponin, ED (not at [facility])     Status: None   Collection Time: 11/26/15  5:16 AM  Result Value Ref Range   Troponin i, poc 0.00 0.00 - 0.08 ng/mL   Comment 3             Comment: Due to the release kinetics of cTnI, a negative result within the first hours of the onset of symptoms does not rule out myocardial infarction with certainty. If myocardial infarction is still suspected, repeat the test at appropriate intervals.   I-Stat Chem 8, ED     Status: Abnormal   Collection Time: 11/26/15  5:18 AM  Result Value Ref Range   Sodium 135 135 - 145 mmol/L   Potassium 4.3 3.5 - 5.1 mmol/L   Chloride 99 (L) 101 - 111 mmol/L   BUN 13 6 - 20 mg/dL   Creatinine, Ser 1.10 0.61 - 1.24 mg/dL   Glucose, Bld 136 (H) 65 - 99 mg/dL   Calcium, Ion 1.09 (L) 1.15 - 1.40 mmol/L   TCO2 26 0 - 100 mmol/L   Hemoglobin 13.6 13.0 - 17.0 g/dL   HCT 40.0 39.0 - 52.0 %   Ct Angio Chest Pe W And/or Wo Contrast  Result Date: 11/26/2015 CLINICAL DATA:  Sudden onset left lower rib pain after surgery to remove a metastatic mass in the tail of the pancreas on 11/05/2015. Metastatic colon cancer. Previous right hemicolectomy. History of hypertension, ulcerative colitis, umbilical hernia repair, cholecystectomy. EXAM: CT ANGIOGRAPHY CHEST CT ABDOMEN AND PELVIS WITH CONTRAST TECHNIQUE: Multidetector CT imaging of the chest was performed using the standard protocol during bolus administration of intravenous contrast. Multiplanar CT image reconstructions and MIPs were obtained to evaluate the vascular anatomy. Multidetector CT imaging of the abdomen and pelvis was performed using the standard protocol during bolus administration of intravenous contrast. CONTRAST:  100 mL Isovue 370 COMPARISON:  CT chest abdomen and pelvis 09/17/2015. PET-CT scan 10/10/2015. FINDINGS: CTA CHEST FINDINGS Cardiovascular: Satisfactory opacification of the pulmonary arteries to the segmental level. No evidence of pulmonary embolism. Normal heart size. No pericardial effusion. Aortic atherosclerosis. No aortic aneurysm or dissection. No pericardial effusion. Mediastinum/Nodes: No enlarged mediastinal, hilar, or  axillary lymph nodes. Thyroid gland, trachea, and esophagus demonstrate no significant findings. Small esophageal hiatal hernia. Lungs/Pleura: Small bilateral pleural effusions with basilar atelectasis, greater on the right. No pneumothorax. Airways are patent. Musculoskeletal: Degenerative changes in the spine. No destructive  bone lesions. Review of the MIP images confirms the above findings. CT ABDOMEN and PELVIS FINDINGS Hepatobiliary: No focal liver abnormality is seen. Status post cholecystectomy. No biliary dilatation. Mild diffuse fatty infiltration of the liver. Pancreas: Postoperative changes with resection of the pancreatic tail. Normal enhancement of the remaining pancreatic tissue without ductal dilatation. There is a circumscribed fluid collection with thin enhancing wall arising in the left upper quadrant adjacent to the pancreatic tail at the margin of resection. The collection measures about 6.7 x 9.8 x 9.9 cm. There is surrounding infiltration in the left upper quadrant fat with a small amount of free fluid under the left hemidiaphragm. There appears to be a fistula extending from a fluid collection along the left flank down to the level of the the iliac crests, where it extends through the rectus abdominus muscle with small fluid collection and with scarring extending to the skin surface. This may represent a cutaneous fistula and is likely corresponding to the site of a previous surgical drain. This fluid collection could represent an abscess or a pseudo cyst relating to breakdown of the pancreatic margin of resection. Postoperative fluid collection felt less likely due to timing. Spleen: Normal in size without focal abnormality. Adrenals/Urinary Tract: 2 stones in the lower pole right kidney, largest measuring 5.6 mm diameter. No hydronephrosis. Renal nephrograms are symmetrical. Bladder wall is not thickened. Stomach/Bowel: Stomach is within normal limits. Appendix appears normal. No evidence of  bowel wall thickening, distention, or inflammatory changes. Postoperative partial colectomy with anastomosis demonstrated at the mid transverse region. Vascular/Lymphatic: Aortic atherosclerosis. No enlarged abdominal or pelvic lymph nodes. Reproductive: Prostate is unremarkable. Other: No free air in the abdomen. Postoperative changes along the midline consistent with recent surgery. Musculoskeletal: Degenerative changes in the spine. No destructive bone lesions. Review of the MIP images confirms the above findings. IMPRESSION: 1. No evidence of significant pulmonary embolus. 2. Bilateral pleural effusions with basilar atelectasis or consolidation, greater on the left. 3. Large fluid collection in the left upper quadrant with adjacent stranding and small subdiaphragmatic fluid. This likely represents either abscess or pseudocyst related to breakdown of pancreatic margin of resection. 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 4. Nonobstructing intrarenal stones in the right kidney. These results were called by telephone at the time of interpretation on 11/26/2015 at 6:47 am to Dr. Veatrice Kells , who verbally acknowledged these results. Electronically Signed   By: Lucienne Capers M.D.   On: 11/26/2015 06:48   Ct Abdomen Pelvis W Contrast  Result Date: 11/26/2015 CLINICAL DATA:  Sudden onset left lower rib pain after surgery to remove a metastatic mass in the tail of the pancreas on 11/05/2015. Metastatic colon cancer. Previous right hemicolectomy. History of hypertension, ulcerative colitis, umbilical hernia repair, cholecystectomy. EXAM: CT ANGIOGRAPHY CHEST CT ABDOMEN AND PELVIS WITH CONTRAST TECHNIQUE: Multidetector CT imaging of the chest was performed using the standard protocol during bolus administration of intravenous contrast. Multiplanar CT image reconstructions and MIPs were obtained to evaluate the vascular anatomy. Multidetector CT  imaging of the abdomen and pelvis was performed using the standard protocol during bolus administration of intravenous contrast. CONTRAST:  100 mL Isovue 370 COMPARISON:  CT chest abdomen and pelvis 09/17/2015. PET-CT scan 10/10/2015. FINDINGS: CTA CHEST FINDINGS Cardiovascular: Satisfactory opacification of the pulmonary arteries to the segmental level. No evidence of pulmonary embolism. Normal heart size. No pericardial effusion. Aortic atherosclerosis. No aortic aneurysm or dissection. No pericardial effusion. Mediastinum/Nodes: No  enlarged mediastinal, hilar, or axillary lymph nodes. Thyroid gland, trachea, and esophagus demonstrate no significant findings. Small esophageal hiatal hernia. Lungs/Pleura: Small bilateral pleural effusions with basilar atelectasis, greater on the right. No pneumothorax. Airways are patent. Musculoskeletal: Degenerative changes in the spine. No destructive bone lesions. Review of the MIP images confirms the above findings. CT ABDOMEN and PELVIS FINDINGS Hepatobiliary: No focal liver abnormality is seen. Status post cholecystectomy. No biliary dilatation. Mild diffuse fatty infiltration of the liver. Pancreas: Postoperative changes with resection of the pancreatic tail. Normal enhancement of the remaining pancreatic tissue without ductal dilatation. There is a circumscribed fluid collection with thin enhancing wall arising in the left upper quadrant adjacent to the pancreatic tail at the margin of resection. The collection measures about 6.7 x 9.8 x 9.9 cm. There is surrounding infiltration in the left upper quadrant fat with a small amount of free fluid under the left hemidiaphragm. There appears to be a fistula extending from a fluid collection along the left flank down to the level of the the iliac crests, where it extends through the rectus abdominus muscle with small fluid collection and with scarring extending to the skin surface. This may represent a cutaneous fistula and is  likely corresponding to the site of a previous surgical drain. This fluid collection could represent an abscess or a pseudo cyst relating to breakdown of the pancreatic margin of resection. Postoperative fluid collection felt less likely due to timing. Spleen: Normal in size without focal abnormality. Adrenals/Urinary Tract: 2 stones in the lower pole right kidney, largest measuring 5.6 mm diameter. No hydronephrosis. Renal nephrograms are symmetrical. Bladder wall is not thickened. Stomach/Bowel: Stomach is within normal limits. Appendix appears normal. No evidence of bowel wall thickening, distention, or inflammatory changes. Postoperative partial colectomy with anastomosis demonstrated at the mid transverse region. Vascular/Lymphatic: Aortic atherosclerosis. No enlarged abdominal or pelvic lymph nodes. Reproductive: Prostate is unremarkable. Other: No free air in the abdomen. Postoperative changes along the midline consistent with recent surgery. Musculoskeletal: Degenerative changes in the spine. No destructive bone lesions. Review of the MIP images confirms the above findings. IMPRESSION: 1. No evidence of significant pulmonary embolus. 2. Bilateral pleural effusions with basilar atelectasis or consolidation, greater on the left. 3. Large fluid collection in the left upper quadrant with adjacent stranding and small subdiaphragmatic fluid. This likely represents either abscess or pseudocyst related to breakdown of pancreatic margin of resection. 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 4. Nonobstructing intrarenal stones in the right kidney. These results were called by telephone at the time of interpretation on 11/26/2015 at 6:47 am to Dr. Veatrice Kells , who verbally acknowledged these results. Electronically Signed   By: Lucienne Capers M.D.   On: 11/26/2015 06:48      Assessment/Plan Intraabdominal abscess vs pancreatic psuedocyst   S/p exploratory laparotomy, LOA, distal pancreatectomy 11/05/15 Dr. [name]  Admit to [facility]  NPO, IVF, analgesics, anti-emetics  IR consult for possible percutaneous drain   Continue to hold Xarelto   HTN - will resume home meds s/p anticipated perc drain, PRN hydralazine  PMH arterial thromboembolism - Xarelto held   Jill Alexanders, [facility] 11/26/2015, 7:40 AM Pager: [phone] Consults: [phone] Mon-Fri 7:00 am-4:30 pm Sat-Sun 7:00 am-11:30 am

## 2015-11-26 NOTE — Procedures (Signed)
Interventional Radiology Procedure Note  Procedure: CT guided drain into pancreatic fluid collection.  67F drain.  ~225cc of grayish fluid aspirated.  Sample to lab.   Complications: None Recommendations:  - Gravity drain.  - BID - TID sterile saline flushes.  Record daily output - Do not submerge - Routine care   Signed,  Dulcy Fanny. Earleen Newport, DO

## 2015-11-26 NOTE — Discharge Instructions (Addendum)
Call [facility] with any questions or concerns [phone] Follow up with Dr. Hassell Done this coming Friday Record drain output daily and bring with you to your doctor's visit  Continue to work on improving your nutrition Take antibiotic as prescribed  Low-Fat Diet for Pancreatitis or Gallbladder Conditions A low-fat diet can be helpful if you have pancreatitis or a gallbladder condition. With these conditions, your pancreas and gallbladder have trouble digesting fats. A healthy eating plan with less fat will help rest your pancreas and gallbladder and reduce your symptoms. What do I need to know about this diet?  Eat a low-fat diet.  Reduce your fat intake to less than 20-30% of your total daily calories. This is less than 50-60 g of fat per day.  Remember that you need some fat in your diet. Ask your dietician what your daily goal should be.  Choose nonfat and low-fat healthy foods. Look for the words nonfat, low fat, or fat free.  As a guide, look on the label and choose foods with less than 3 g of fat per serving. Eat only one serving.  Avoid alcohol.  Do not smoke. If you need help quitting, talk with your health care provider.  Eat small frequent meals instead of three large heavy meals. What foods can I eat? Grains  Include healthy grains and starches such as potatoes, wheat bread, fiber-rich cereal, and brown rice. Choose whole grain options whenever possible. In adults, whole grains should account for 45-65% of your daily calories. Fruits and Vegetables  Eat plenty of fruits and vegetables. Fresh fruits and vegetables add fiber to your diet. Meats and Other Protein Sources  Eat lean meat such as chicken and pork. Trim any fat off of meat before cooking it. Eggs, fish, and beans are other sources of protein. In adults, these foods should account for 10-35% of your daily calories. Dairy  Choose low-fat milk and dairy options. Dairy includes fat and protein,  as well as calcium. Fats and Oils  Limit high-fat foods such as fried foods, sweets, baked goods, sugary drinks. Other  Creamy sauces and condiments, such as mayonnaise, can add extra fat. Think about whether or not you need to use them, or use smaller amounts or low fat options. What foods are not recommended?  High fat foods, such as:  [employer].  Ice cream.  [place] toast.  Sweet rolls.  Pizza.  Cheese bread.  Foods covered with batter, butter, creamy sauces, or cheese.  Fried foods.  Sugary drinks and desserts.  Foods that cause gas or bloating This information is not intended to replace advice given to you by your health care provider. Make sure you discuss any questions you have with your health care provider. Document Released: 12/27/2012 Document Revised: 05/30/2015 Document Reviewed: 12/05/2012 Elsevier Interactive Patient Education  2017 [place].  Drainage Catheter Home Guide A drainage catheter is a tube that is inserted through your skin into the fluid collection in your abdomen. The purpose of a  drainage catheter is to help drain the fluid collection in your abdomen   HOW DO I FLUSH A DRAIN NOT ATTACHED TO A BAG? Biliary drains should be flushed daily unless you are instructed otherwise by a health care provider. The end of the drain is closed using an IV cap to which a syringe can be directly connected.  1. Clean the IV cap with an alcohol swab and then screw the tip of a 10 ml normal saline syringe onto the  IV cap. 2. Inject the saline over 5-10 seconds. If you feel resistance while injecting, stop immediately. 3. Remove the syringe from the cap. HOW DO I EMPTY MY DRAINAGE BAG? The drainage bag should be emptied when it becomes 2/3 full or before you go to sleep. Most drainage bags have a drainage valve at the bottom that allows them to be emptied easily. 1. Hold the bag over the toilet or basin (or measuring container, if you are directed to measure  the drainage). 2. Unscrew the valve to open it, and allow the bag to drain. 3. Close the valve securely to avoid leakage, and wipe it clean with a tissue or disposable napkin. SEEK MEDICAL CARE IF:  Your pain gets worse after an initial improvement.  You have any questions about your tube.  Your redness, soreness, or swelling at the tube insertion site gets worse despite good cleaning.  Your skin breaks down around the tube.  You have bright red blood in bag  Your tube becomes blocked or clogged.  Your catheter is dislodged or comes out.  You have a fever.  You have chills or increased pain. MAKE SURE YOU:  Understand these instructions.  Will watch your condition.  Will get help right away if you are not doing well or get worse. This information is not intended to replace advice given to you by your health care provider. Make sure you discuss any questions you have with your health care provider. Document Released: 10/12/2012 Document Revised: 12/27/2012 Document Reviewed: 10/12/2012 Elsevier Interactive Patient Education  2017 [employer].

## 2015-11-26 NOTE — ED Provider Notes (Signed)
[place] DEPT Provider Note   CSN: [id number] Arrival date & time: 11/26/15  0441     History   Chief Complaint Chief Complaint  Patient presents with  . Chest Pain    HPI Cody Suarez is a 70 y.o. male.  The history is provided by the patient.  Chest Pain   This is a new problem. The current episode started yesterday. The problem occurs constantly. The problem has not changed since onset.The pain is associated with breathing. Pain location: LL chest and LUQ. The pain is severe. The quality of the pain is described as sharp. The pain does not radiate. Duration of episode(s) is 1 day. The symptoms are aggravated by deep breathing. Associated symptoms include abdominal pain and cough. Pertinent negatives include no back pain, no diaphoresis, no fever, no hemoptysis, no irregular heartbeat, no leg pain, no nausea, no orthopnea, no palpitations, no syncope and no vomiting. He has tried nothing for the symptoms. The treatment provided no relief. Risk factors include male gender (cancer and post op).  His past medical history is significant for cancer.  Pertinent negatives for past medical history include no Marfan's syndrome.  Pertinent negatives for family medical history include: no Marfan's syndrome.  Procedure history is positive for echocardiogram.    Past Medical History:  Diagnosis Date  . Anemia of chronic disease 2015   "related to cancer tx" (08/09/2013)  . Blood clot in vein 2015  . Colon cancer ([place]) 03/2013  . History of kidney stones    x 2passed them both  . History of kidney stones    x 2 passed them both  . Hypertension   . Peripheral vascular disease ([place])   . Ulcerative colitis [facility])    history    Patient Active Problem List   Diagnosis Date Noted  . Recurrent colorectal cancer ([place]) 11/05/2015  . Anemia of chronic disease 09/06/2014  . 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 Atherosclerotic PVD with  intermittent claudication ([place]) 10/31/2013  . 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 Hypertension   . 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 Right inguinal hernia 03/23/2013  . Umbilical hernia 53/29/9242  . Ulcerative colitis ([place]) 03/23/2013    Past Surgical History:  Procedure Laterality Date  . ABDOMINAL AORTAGRAM N/A 08/09/2013   Procedure: ABDOMINAL Maxcine Ham;  Surgeon: [name], MD;  Location: [place] CATH LAB;  Service: Cardiovascular;  Laterality: N/A;  . CHOLECYSTECTOMY N/A 09/08/2014   Procedure: LAPAROSCOPIC CHOLECYSTECTOMY WITH INTRAOPERATIVE CHOLANGIOGRAM;  Surgeon: Armandina Gemma, MD;  Location: [facility] ORS;  Service: General;  Laterality: N/A;  . COLON SURGERY    . FEMORAL-POPLITEAL BYPASS GRAFT Right 08/11/2013   Procedure:   RIGHT - POPLITEAL TO PERONEAL ARTERY BYPASS GRAFT  WITH NONREVERSED SAPHENOUS VEIN GRAFT,tHROMBECTOMY ANTERIOR TIBIALIS,ATTEMPTED THROMBECTOMY TIBIO-PERONEAL TRUNK AND POSTERIOR TIBIALIS, INTRAOPERATIVE ARTERIOGRAM.;  Surgeon: Mal Misty, MD;  Location: [place];  Service: Vascular;  Laterality: Right;  . FEMORAL-TIBIAL BYPASS GRAFT Right 11/17/2013   Procedure: BYPASS GRAFT RIGHT ABOVE KNEE POPLITEAL TO POSTERIOR TIBIAL ARTERY USING RIGHT NON-REVERSED CEPHALIC VEIN;  Surgeon: Mal Misty, MD;  Location: [facility];  Service: Vascular;  Laterality: Right;  . HERNIA REPAIR  ~ 1995; 03/2013   UHR  . INTRAOPERATIVE ARTERIOGRAM Right 11/17/2013   Procedure: INTRA OPERATIVE ARTERIOGRAM;  Surgeon: Mal Misty, MD;  Location: [facility] OR;  Service: Vascular;  Laterality: Right;  . LAPAROSCOPIC RIGHT HEMI COLECTOMY N/A 03/28/2013   Procedure: LAPAROSCOPIC ASSISTED HEMI COLECTOMY;  Surgeon: Pedro Earls, MD;  Location: [facility];  Service: General;  Laterality: N/A;  . LAPAROSCOPY N/A 11/05/2015   Procedure: LAPAROSCOPY  DIAGNOSTIC;  Surgeon: [name], MD;  Location: [facility] ORS;  Service: General;  Laterality: N/A;  . LAPAROTOMY N/A 11/05/2015   Procedure: EXPLORATORY LAPAROTOMY, resection of mass at tail of pancreas;  Surgeon: [name], MD;  Location: [facility] OR[facility];  Service: General;  Laterality: N/A;  . LOWER EXTREMITY ANGIOGRAM Bilateral 08/09/2013  . TEE WITHOUT CARDIOVERSION N/A 08/10/2013   Procedure: TRANSESOPHAGEAL ECHOCARDIOGRAM (TEE);  Surgeon: Candee Furbish, MD;  Location: [facility];  Service: Cardiovascular;  Laterality: N/A;  . TONSILLECTOMY  ~ 1950  . Rodriguez Hevia; 03/2013  . VASECTOMY    . VEIN HARVEST Right 11/17/2013   Procedure: HARVEST OF RIGHT UPPER EXTREMITY CEPHALIC VEIN;  Surgeon: Mal Misty, MD;  Location: [place];  Service: Vascular;  Laterality: Right;       Home Medications    Prior to Admission medications   Medication Sig Start Date End Date Taking? Authorizing Provider  budesonide (ENTOCORT EC) 3 MG 24 hr capsule Take 6 mg by mouth daily.     Historical Provider, MD  carvedilol (COREG) 6.25 MG tablet Take 1 tablet (6.25 mg total) by mouth 2 (two) times daily. 08/12/15   [name], MD  diphenhydramine-acetaminophen (TYLENOL PM) 25-500 MG TABS Take 1 tablet by mouth at bedtime as needed (sleep). Reported on 01/22/2015    Historical Provider, MD  HYDROcodone-acetaminophen (NORCO/VICODIN) 5-325 MG tablet Take 1-2 tablets by mouth every 4 (four) hours as needed for moderate pain. Patient not taking: Reported on 11/22/2015 11/11/15   [name], MD  losartan (COZAAR) 100 MG tablet Take 1 tablet (100 mg total) by mouth daily. 02/06/15   [name], MD  megestrol (MEGACE) 400 MG/10ML suspension Take 5 mLs (200 mg total) by mouth 2 (two) times daily. 11/22/15   [name], MD  Multiple Vitamin (MULTIVITAMIN WITH MINERALS) TABS tablet Take 1 tablet by mouth daily.    Historical Provider, MD  ondansetron (ZOFRAN-ODT) 4 MG disintegrating tablet Take 4 mg  by mouth every 8 (eight) hours as needed. 11/15/15   Historical Provider, MD  spironolactone (ALDACTONE) 25 MG tablet TAKE 1 TABLET DAILY 08/12/15   [name], MD  VIAGRA 50 MG tablet Take 50 mg by mouth daily as needed for erectile dysfunction.  08/16/15   Historical Provider, MD  XARELTO 20 MG TABS tablet TAKE 1 TABLET DAILY WITH BREAKFAST 08/15/15   [name], MD    Family History Family History  Problem Relation Age of Onset  . Heart disease Mother   . Emphysema Father     Social History Social History  Substance Use Topics  . Smoking status: Never Smoker  . Smokeless tobacco: Never Used     Comment: states his father died of emphysema from smoking, he did not want to be like that  . Alcohol use 1.8 oz/week    3 Glasses of wine per week     Comment: occasionally     Allergies   Amlodipine and Gabapentin   Review of Systems Review of Systems  Constitutional: Negative for diaphoresis and fever.  Respiratory: Positive for cough. Negative for hemoptysis.   Cardiovascular: Positive for chest pain. Negative for palpitations, orthopnea and  syncope.  Gastrointestinal: Positive for abdominal pain. Negative for nausea and vomiting.  Musculoskeletal: Negative for back pain.  All other systems reviewed and are negative.    Physical Exam Updated Vital Signs BP 121/77   Pulse 93   Temp 97.8 F (36.6 C) (Oral)   Resp 21   SpO2 97%   Physical Exam  Constitutional: He is oriented to person, place, and time. He appears well-developed and well-nourished.  HENT:  Head: Normocephalic and atraumatic.  Mouth/Throat: No oropharyngeal exudate.  Eyes: EOM are normal. Pupils are equal, round, and reactive to light.  Neck: Normal range of motion. Neck supple. No JVD present.  Cardiovascular: Normal rate, regular rhythm, normal heart sounds and intact distal pulses.   Pulmonary/Chest: Effort normal and breath sounds normal. No stridor. He has no wheezes. He has no rales.    Abdominal: Soft. He exhibits distension. He exhibits no mass. There is no tenderness. There is no guarding.  Musculoskeletal: Normal range of motion.  Neurological: He is alert and oriented to person, place, and time.  Skin: Skin is warm and dry. Capillary refill takes less than 2 seconds. He is not diaphoretic.  Psychiatric: He has a normal mood and affect.     ED Treatments / Results   Vitals:   11/26/15 0449 11/26/15 0530  BP: 150/73 121/77  Pulse: 94 93  Resp:  21  Temp: 97.8 F (36.6 C)    Results for orders placed or performed during the hospital encounter of 11/26/15  CBC with Differential  Result Value Ref Range   WBC 22.8 (H) 4.0 - 10.5 K/uL   RBC 4.23 4.22 - 5.81 MIL/uL   Hemoglobin 12.4 (L) 13.0 - 17.0 g/dL   HCT 36.7 (L) 39.0 - 52.0 %   MCV 86.8 78.0 - 100.0 fL   MCH 29.3 26.0 - 34.0 pg   MCHC 33.8 30.0 - 36.0 g/dL   RDW 14.9 11.5 - 15.5 %   Platelets 731 (H) 150 - 400 K/uL   Neutrophils Relative % 81 %   Neutro Abs 18.4 (H) 1.7 - 7.7 K/uL   Lymphocytes Relative 8 %   Lymphs Abs 1.9 0.7 - 4.0 K/uL   Monocytes Relative 10 %   Monocytes Absolute 2.2 (H) 0.1 - 1.0 K/uL   Eosinophils Relative 1 %   Eosinophils Absolute 0.2 0.0 - 0.7 K/uL   Basophils Relative 0 %   Basophils Absolute 0.1 0.0 - 0.1 K/uL  Comprehensive metabolic panel  Result Value Ref Range   Sodium 134 (L) 135 - 145 mmol/L   Potassium 4.2 3.5 - 5.1 mmol/L   Chloride 100 (L) 101 - 111 mmol/L   CO2 25 22 - 32 mmol/L   Glucose, Bld 139 (H) 65 - 99 mg/dL   BUN 13 6 - 20 mg/dL   Creatinine, Ser 1.24 0.61 - 1.24 mg/dL   Calcium 9.0 8.9 - 10.3 mg/dL   Total Protein 8.0 6.5 - 8.1 g/dL   Albumin 3.1 (L) 3.5 - 5.0 g/dL   AST 26 15 - 41 U/L   ALT 21 17 - 63 U/L   Alkaline Phosphatase 68 38 - 126 U/L   Total Bilirubin 0.4 0.3 - 1.2 mg/dL   GFR calc non Af Amer 57 (L) >60 mL/min   GFR calc Af Amer >60 >60 mL/min   Anion gap 9 5 - 15  Lipase, blood  Result Value Ref Range   Lipase 27 11 - 51  U/L  I-Stat Chem 8,  ED  Result Value Ref Range   Sodium 135 135 - 145 mmol/L   Potassium 4.3 3.5 - 5.1 mmol/L   Chloride 99 (L) 101 - 111 mmol/L   BUN 13 6 - 20 mg/dL   Creatinine, Ser 1.10 0.61 - 1.24 mg/dL   Glucose, Bld 136 (H) 65 - 99 mg/dL   Calcium, Ion 1.09 (L) 1.15 - 1.40 mmol/L   TCO2 26 0 - 100 mmol/L   Hemoglobin 13.6 13.0 - 17.0 g/dL   HCT 40.0 39.0 - 52.0 %  I-Stat Troponin, ED (not at [facility])  Result Value Ref Range   Troponin i, poc 0.00 0.00 - 0.08 ng/mL   Comment 3           Results for orders placed or performed during the hospital encounter of 11/26/15  CBC with Differential  Result Value Ref Range   WBC 22.8 (H) 4.0 - 10.5 K/uL   RBC 4.23 4.22 - 5.81 MIL/uL   Hemoglobin 12.4 (L) 13.0 - 17.0 g/dL   HCT 36.7 (L) 39.0 - 52.0 %   MCV 86.8 78.0 - 100.0 fL   MCH 29.3 26.0 - 34.0 pg   MCHC 33.8 30.0 - 36.0 g/dL   RDW 14.9 11.5 - 15.5 %   Platelets 731 (H) 150 - 400 K/uL   Neutrophils Relative % 81 %   Neutro Abs 18.4 (H) 1.7 - 7.7 K/uL   Lymphocytes Relative 8 %   Lymphs Abs 1.9 0.7 - 4.0 K/uL   Monocytes Relative 10 %   Monocytes Absolute 2.2 (H) 0.1 - 1.0 K/uL   Eosinophils Relative 1 %   Eosinophils Absolute 0.2 0.0 - 0.7 K/uL   Basophils Relative 0 %   Basophils Absolute 0.1 0.0 - 0.1 K/uL  Comprehensive metabolic panel  Result Value Ref Range   Sodium 134 (L) 135 - 145 mmol/L   Potassium 4.2 3.5 - 5.1 mmol/L   Chloride 100 (L) 101 - 111 mmol/L   CO2 25 22 - 32 mmol/L   Glucose, Bld 139 (H) 65 - 99 mg/dL   BUN 13 6 - 20 mg/dL   Creatinine, Ser 1.24 0.61 - 1.24 mg/dL   Calcium 9.0 8.9 - 10.3 mg/dL   Total Protein 8.0 6.5 - 8.1 g/dL   Albumin 3.1 (L) 3.5 - 5.0 g/dL   AST 26 15 - 41 U/L   ALT 21 17 - 63 U/L   Alkaline Phosphatase 68 38 - 126 U/L   Total Bilirubin 0.4 0.3 - 1.2 mg/dL   GFR calc non Af Amer 57 (L) >60 mL/min   GFR calc Af Amer >60 >60 mL/min   Anion gap 9 5 - 15  Lipase, blood  Result Value Ref Range   Lipase 27 11 - 51 U/L   I-Stat Chem 8, ED  Result Value Ref Range   Sodium 135 135 - 145 mmol/L   Potassium 4.3 3.5 - 5.1 mmol/L   Chloride 99 (L) 101 - 111 mmol/L   BUN 13 6 - 20 mg/dL   Creatinine, Ser 1.10 0.61 - 1.24 mg/dL   Glucose, Bld 136 (H) 65 - 99 mg/dL   Calcium, Ion 1.09 (L) 1.15 - 1.40 mmol/L   TCO2 26 0 - 100 mmol/L   Hemoglobin 13.6 13.0 - 17.0 g/dL   HCT 40.0 39.0 - 52.0 %  I-Stat Troponin, ED (not at [facility])  Result Value Ref Range   Troponin i, poc 0.00 0.00 - 0.08 ng/mL   Comment 3  Dg Chest 2 View  Result Date: 11/09/2015 CLINICAL DATA:  Rales. Hx colon cancer, htn, PVD, nonsmoker EXAM: CHEST  2 VIEW COMPARISON:  09/06/2014 and 09/26/2013 FINDINGS: Lung volumes are relatively low. Mild streaky opacity at the lung bases likely atelectasis. No convincing pneumonia. No pulmonary edema. No pleural effusion or pneumothorax. Cardiac silhouette is normal in size. No mediastinal or hilar masses or convincing adenopathy. Skeletal structures are intact. IMPRESSION: No active cardiopulmonary disease. Electronically Signed   By: Lajean Manes M.D.   On: 11/09/2015 08:32     EKG  EKG Interpretation  Date/Time:  Tuesday November 26 2015 05:10:14 EST Ventricular Rate:  93 PR Interval:    QRS Duration: 82 QT Interval:  343 QTC Calculation: 427 R Axis:   -31 Text Interpretation:  Sinus rhythm I Confirmed by [facility]  MD, Neeraj Housand ([id number]) on 11/26/2015 5:14:56 AM      Procedures Procedures (including critical care time)  Medications Ordered in ED Medications  sodium chloride 0.9 % injection (not administered)  iopamidol (ISOVUE-370) 76 % injection (not administered)  vancomycin (VANCOCIN) IVPB 1000 mg/200 mL premix (not administered)  piperacillin-tazobactam (ZOSYN) IVPB 3.375 g (not administered)  fentaNYL (SUBLIMAZE) injection 50 mcg (not administered)  iopamidol (ISOVUE-370) 76 % injection 100 mL (100 mLs Intravenous Contrast Given 11/26/15 0555)    649 Case d/w Dr. Marlou Starks of  [place], keep NPO to be seen by [facility]  Final Clinical Impressions(s) / ED Diagnoses  Intraabdominal abscess.  Will require IV antibiotics given leukocytosis thrombocytosis and pain      Canon Gola, MD 11/26/15 [phone]

## 2015-11-26 NOTE — ED Notes (Signed)
Pt complains of left sided rib pain, in October he had colon cancer surgery, no vomiting or diarrhea

## 2015-11-26 NOTE — ED Notes (Signed)
Pt states that it hurts to take a deep breath, no coughing, no injury Pt says the pain started yesterday morning

## 2015-11-26 NOTE — Progress Notes (Signed)
Initial Nutrition Assessment  DOCUMENTATION CODES:   Severe malnutrition in context of acute illness/injury  INTERVENTION:  Advance diet per surgery.  Will provide oral nutrition supplement upon diet advancement and education on obtaining adequate calories and protein through meals and snacks. Patient does not like Ensure because it is too sweet.  NUTRITION DIAGNOSIS:   Inadequate oral intake related to inability to eat as evidenced by NPO status.  GOAL:   Patient will meet greater than or equal to 90% of their needs  MONITOR:   Diet advancement, Labs, Weight trends, I & O's  REASON FOR ASSESSMENT:   Malnutrition Screening Tool    ASSESSMENT:   70 y/o male with PMH significant for ulcerative colitis, umbilical hernia repair, cholecystectomy, arterial thromboembolism, PVD, HTN, recurrent colorectal cancer, and anemia of chronic disease. He underwent a laparoscopic right hemicolectomy in 2015 (Dr. Hassell Done) for colon cancer and, more recently on 11/05/15, exploratory laparotomy with enterolysis, excision of mesenteric metastasis, and distal pancreatectomy (Dr. Hassell Done) for recurrent adenocarcinoma of the colon. He is followed by oncologist Dr. Benay Spice and there is currently no evidence of further metastatic disease and the patient is not undergoing adjuvant chemotherapy. He presents today with LUQ/left rib pain that is sharp, constant, and worse with inspiration.   -Patient now s/p CT guided drain into pancreatic fluid collection. 225 cc of grayish fluid aspirated and drain to gravity. -Plan is for patient to remain NPO pending workup.    -As distal pancreas was resected 3 weeks ago, monitor for exocrine or endocrine pancreatic insufficiency in setting of weight loss. Signs of exocrine insufficiency would include maldigestion of fat, protein, weight loss, steatorrhea, bloating, cramping.  Spoke with patient and family at bedside. Reports poor appetite and nausea for 3 weeks (since  surgery on 10/31). Denies abdominal pain or constipation/diarrhea. Reports he has been eating much less than normal intake prior to surgery. He has been able to tolerate Gatorade, water, and only 3-4 bites of 2-3 meals daily. Yesterday patient had 1/2 banana sandwich, 1/2 bowl vegetable soup, a few bites of corn flakes. Family has purchased Ensure for him to drink at home but he has only had 3 total. Patient reports Ensure is too sweet for him.   UBW is 195-196 lbs. Patient has lost 18 lbs (9% body weight) in 3 weeks, which is significant for time frame.  Medications reviewed and include: Zosyn, D5-1/2NS with KCl 20 mEq/L @ 100 ml/hr (120 grams dextrose, 408 kcal daily).  Labs reviewed: Chloride 99, Glucose 1.36, Ionized Calcium 1.09, Glucose 136.   Nutrition-Focused physical exam completed. Findings are no fat depletion, no muscle depletion, and mild edema (right leg). Possibly some muscle loss noted on dorsal hand, but none evident anywhere else. Patient reports he feels very weak since last operation and weight loss. Endorses right leg occasionally has fluid accumulation due to poor circulation.    Patient meets criteria for severe acute malnutrition in setting of 9% weight loss in 3 weeks, intake </=50% of estimated energy requirement for >/= 5 days.  Discussed with RN.   Diet Order:  Diet NPO time specified  Skin:  Reviewed, no issues  Last BM:  11/25/2015  Height:   Ht Readings from Last 1 Encounters:  11/22/15 5' 10"  (1.778 m)    Weight:   Wt Readings from Last 1 Encounters:  11/22/15 177 lb (80.3 kg)    Ideal Body Weight:  75.45 kg  BMI:  There is no height or weight on file to calculate  BMI.  Estimated Nutritional Needs:   Kcal:  4584-8350 (MSJ x 1.3-1.5)  Protein:  105-120 grams (1.3-1.5 grams/kg)  Fluid:  >/= 2 L/day (25 ml/kg)  EDUCATION NEEDS:   Education needs no appropriate at this time  Willey Blade, MS, RD, LDN Pager: [phone] After Hours Pager:  [phone]

## 2015-11-27 DIAGNOSIS — C185 Malignant neoplasm of splenic flexure: Secondary | ICD-10-CM | POA: Diagnosis present

## 2015-11-27 DIAGNOSIS — T814XXA Infection following a procedure, initial encounter: Secondary | ICD-10-CM | POA: Diagnosis present

## 2015-11-27 DIAGNOSIS — I739 Peripheral vascular disease, unspecified: Secondary | ICD-10-CM | POA: Diagnosis present

## 2015-11-27 DIAGNOSIS — C7889 Secondary malignant neoplasm of other digestive organs: Secondary | ICD-10-CM | POA: Diagnosis present

## 2015-11-27 DIAGNOSIS — A4901 Methicillin susceptible Staphylococcus aureus infection, unspecified site: Secondary | ICD-10-CM | POA: Diagnosis present

## 2015-11-27 DIAGNOSIS — Z90411 Acquired partial absence of pancreas: Secondary | ICD-10-CM | POA: Diagnosis not present

## 2015-11-27 DIAGNOSIS — B9561 Methicillin susceptible Staphylococcus aureus infection as the cause of diseases classified elsewhere: Secondary | ICD-10-CM | POA: Diagnosis present

## 2015-11-27 DIAGNOSIS — K651 Peritoneal abscess: Secondary | ICD-10-CM | POA: Diagnosis present

## 2015-11-27 DIAGNOSIS — C786 Secondary malignant neoplasm of retroperitoneum and peritoneum: Secondary | ICD-10-CM | POA: Diagnosis present

## 2015-11-27 DIAGNOSIS — Z9889 Other specified postprocedural states: Secondary | ICD-10-CM | POA: Diagnosis not present

## 2015-11-27 DIAGNOSIS — I10 Essential (primary) hypertension: Secondary | ICD-10-CM | POA: Diagnosis present

## 2015-11-27 DIAGNOSIS — E875 Hyperkalemia: Secondary | ICD-10-CM | POA: Diagnosis present

## 2015-11-27 DIAGNOSIS — E43 Unspecified severe protein-calorie malnutrition: Secondary | ICD-10-CM | POA: Diagnosis present

## 2015-11-27 DIAGNOSIS — Z87442 Personal history of urinary calculi: Secondary | ICD-10-CM | POA: Diagnosis not present

## 2015-11-27 DIAGNOSIS — Z85048 Personal history of other malignant neoplasm of rectum, rectosigmoid junction, and anus: Secondary | ICD-10-CM | POA: Diagnosis not present

## 2015-11-27 DIAGNOSIS — Z7901 Long term (current) use of anticoagulants: Secondary | ICD-10-CM | POA: Diagnosis not present

## 2015-11-27 DIAGNOSIS — Z825 Family history of asthma and other chronic lower respiratory diseases: Secondary | ICD-10-CM | POA: Diagnosis not present

## 2015-11-27 DIAGNOSIS — C189 Malignant neoplasm of colon, unspecified: Secondary | ICD-10-CM | POA: Diagnosis present

## 2015-11-27 DIAGNOSIS — Z8249 Family history of ischemic heart disease and other diseases of the circulatory system: Secondary | ICD-10-CM | POA: Diagnosis not present

## 2015-11-27 DIAGNOSIS — Z9049 Acquired absence of other specified parts of digestive tract: Secondary | ICD-10-CM | POA: Diagnosis not present

## 2015-11-27 DIAGNOSIS — D638 Anemia in other chronic diseases classified elsewhere: Secondary | ICD-10-CM | POA: Diagnosis present

## 2015-11-27 LAB — BASIC METABOLIC PANEL
Anion gap: 8 (ref 5–15)
BUN: 11 mg/dL (ref 6–20)
CALCIUM: 8.2 mg/dL — AB (ref 8.9–10.3)
CO2: 24 mmol/L (ref 22–32)
Chloride: 101 mmol/L (ref 101–111)
Creatinine, Ser: 1.15 mg/dL (ref 0.61–1.24)
GFR calc Af Amer: >60 mL/min (ref >60)
GLUCOSE: 208 mg/dL — AB (ref 65–99)
POTASSIUM: 5.2 mmol/L — AB (ref 3.5–5.1)
Sodium: 133 mmol/L — ABNORMAL LOW (ref 135–145)

## 2015-11-27 LAB — CBC
HCT: 35.8 % — ABNORMAL LOW (ref 39.0–52.0)
Hemoglobin: 11.5 g/dL — ABNORMAL LOW (ref 13.0–17.0)
MCH: 28.6 pg (ref 26.0–34.0)
MCHC: 32.1 g/dL (ref 30.0–36.0)
MCV: 89.1 fL (ref 78.0–100.0)
PLATELETS: 750 10*3/uL — AB (ref 150–400)
RBC: 4.02 MIL/uL — ABNORMAL LOW (ref 4.22–5.81)
RDW: 15.2 % (ref 11.5–15.5)
WBC: 25.7 10*3/uL — ABNORMAL HIGH (ref 4.0–10.5)

## 2015-11-27 LAB — PREALBUMIN: PREALBUMIN: 8 mg/dL — AB (ref 18–38)

## 2015-11-27 MED ORDER — DEXTROSE-NACL 5-0.45 % IV SOLN
INTRAVENOUS | Status: DC
  Administered 2015-11-27 – 2015-11-28 (×4): via INTRAVENOUS

## 2015-11-27 MED ORDER — HYDROCHLOROTHIAZIDE 25 MG PO TABS
25.0000 mg | ORAL_TABLET | Freq: Every day | ORAL | Status: DC
  Administered 2015-11-28 – 2015-11-30 (×3): 25 mg via ORAL
  Filled 2015-11-27 (×3): qty 1

## 2015-11-27 MED ORDER — LIP MEDEX EX OINT
TOPICAL_OINTMENT | CUTANEOUS | Status: AC
  Administered 2015-11-27: 08:00:00
  Filled 2015-11-27: qty 7

## 2015-11-27 MED ORDER — SPIRONOLACTONE 25 MG PO TABS
25.0000 mg | ORAL_TABLET | Freq: Every day | ORAL | Status: DC
  Administered 2015-11-28 – 2015-11-30 (×3): 25 mg via ORAL
  Filled 2015-11-27 (×3): qty 1

## 2015-11-27 MED ORDER — SPIRONOLACTONE-HCTZ 25-25 MG PO TABS: 1.0000 | ORAL_TABLET | Freq: Every day | ORAL | Status: DC

## 2015-11-27 MED ORDER — BOOST / RESOURCE BREEZE PO LIQD
1.0000 | Freq: Three times a day (TID) | ORAL | Status: DC
  Administered 2015-11-28: 1 via ORAL

## 2015-11-27 NOTE — Progress Notes (Signed)
Referring Physician(s): Dr. Greer Suarez  Supervising Physician: Cody Suarez  Patient Status:  [facility] - In-pt  Chief Complaint:  S/P Left abdominal drain placement  Subjective: Gravity drain placed by Dr. Earleen Suarez (11/21) in left upper abdomen for large fluid collection.  Patient seen this AM.  Reports he has increased, but well-controlled pain int he LUQ.  Reports nausea this AM.  Attempting clear liquids but nausea is limiting.  Drain with 25 mL bloody output overnight.  Nursing has been flushing without issue.  Remains afebrile.  WBC slightly elevated (22 -> 25).  Cultures still pending.  Spoke with patient's wife via phone per his request; questions answered.   Allergies: Amlodipine and Gabapentin  Medications: Prior to Admission medications   Medication Sig Start Date End Date Taking? Authorizing Provider  budesonide (ENTOCORT EC) 3 MG 24 hr capsule Take 6 mg by mouth daily with breakfast.    Yes Historical Provider, MD  carvedilol (COREG) 12.5 MG tablet Take 6.25 mg by mouth 2 (two) times daily with a meal.   Yes Historical Provider, MD  diphenhydramine-acetaminophen (TYLENOL PM) 25-500 MG TABS Take 1-2 tablets by mouth at bedtime. Reported on 01/22/2015   Yes Historical Provider, MD  HYDROcodone-acetaminophen (NORCO/VICODIN) 5-325 MG tablet Take 1-2 tablets by mouth every 4 (four) hours as needed for moderate pain. 11/11/15  Yes Cody Hausen, MD  losartan (COZAAR) 100 MG tablet Take 1 tablet (100 mg total) by mouth daily. Patient taking differently: Take 100 mg by mouth daily with breakfast.  02/06/15  Yes Cody Hampshire, MD  megestrol (MEGACE) 400 MG/10ML suspension Take 5 mLs (200 mg total) by mouth 2 (two) times daily. 11/22/15  Yes Cody Pier, MD  Multiple Vitamin (MULTIVITAMIN WITH MINERALS) TABS tablet Take 1 tablet by mouth daily.   Yes Historical Provider, MD  ondansetron (ZOFRAN-ODT) 4 MG disintegrating tablet Take 4 mg by mouth every 8 (eight) hours as needed  for nausea or vomiting.  11/15/15  Yes Historical Provider, MD  spironolactone (ALDACTONE) 25 MG tablet TAKE 1 TABLET DAILY Patient taking differently: TAKE 1 TABLET by mouth in the morning 08/12/15  Yes Cody Hampshire, MD  VIAGRA 50 MG tablet Take 50 mg by mouth daily as needed for erectile dysfunction.  08/16/15  Yes Historical Provider, MD  XARELTO 20 MG TABS tablet TAKE 1 TABLET DAILY WITH BREAKFAST 08/15/15  Yes Cody Hampshire, MD  carvedilol (COREG) 6.25 MG tablet Take 1 tablet (6.25 mg total) by mouth 2 (two) times daily. Patient not taking: Reported on 11/26/2015 08/12/15   Cody Hampshire, MD    Vital Signs: BP 139/66 (BP Location: Right Arm)   Pulse (!) 104   Temp 98.5 F (36.9 C) (Oral)   Resp 16   SpO2 92%   Physical Exam  Constitutional: He is oriented to person, place, and time. He appears well-developed.  Cardiovascular: Normal rate, regular rhythm and normal heart sounds.   Pulmonary/Chest: Effort normal. No respiratory distress. He has rales (on the left).  Abdominal: He exhibits distension (since surgery per pt). There is tenderness (in LUQ). There is no guarding.  Neurological: He is alert and oriented to person, place, and time.  Skin: Skin is warm and dry.  Psychiatric: He has a normal mood and affect. His behavior is normal. Judgment and thought content normal.  Nursing note and vitals reviewed.   Imaging: Ct Angio Chest Pe W And/or Wo Contrast  Result Date: 11/26/2015 CLINICAL DATA:  Sudden onset left  lower rib pain after surgery to remove a metastatic mass in the tail of the pancreas on 11/05/2015. Metastatic colon cancer. Previous right hemicolectomy. History of hypertension, ulcerative colitis, umbilical hernia repair, cholecystectomy. EXAM: CT ANGIOGRAPHY CHEST CT ABDOMEN AND PELVIS WITH CONTRAST TECHNIQUE: Multidetector CT imaging of the chest was performed using the standard protocol during bolus administration of intravenous contrast. Multiplanar CT image  reconstructions and MIPs were obtained to evaluate the vascular anatomy. Multidetector CT imaging of the abdomen and pelvis was performed using the standard protocol during bolus administration of intravenous contrast. CONTRAST:  100 mL Isovue 370 COMPARISON:  CT chest abdomen and pelvis 09/17/2015. PET-CT scan 10/10/2015. FINDINGS: CTA CHEST FINDINGS Cardiovascular: Satisfactory opacification of the pulmonary arteries to the segmental level. No evidence of pulmonary embolism. Normal heart size. No pericardial effusion. Aortic atherosclerosis. No aortic aneurysm or dissection. No pericardial effusion. Mediastinum/Nodes: No enlarged mediastinal, hilar, or axillary lymph nodes. Thyroid gland, trachea, and esophagus demonstrate no significant findings. Small esophageal hiatal hernia. Lungs/Pleura: Small bilateral pleural effusions with basilar atelectasis, greater on the right. No pneumothorax. Airways are patent. Musculoskeletal: Degenerative changes in the spine. No destructive bone lesions. Review of the MIP images confirms the above findings. CT ABDOMEN and PELVIS FINDINGS Hepatobiliary: No focal liver abnormality is seen. Status post cholecystectomy. No biliary dilatation. Mild diffuse fatty infiltration of the liver. Pancreas: Postoperative changes with resection of the pancreatic tail. Normal enhancement of the remaining pancreatic tissue without ductal dilatation. There is a circumscribed fluid collection with thin enhancing wall arising in the left upper quadrant adjacent to the pancreatic tail at the margin of resection. The collection measures about 6.7 x 9.8 x 9.9 cm. There is surrounding infiltration in the left upper quadrant fat with a small amount of free fluid under the left hemidiaphragm. There appears to be a fistula extending from a fluid collection along the left flank down to the level of the the iliac crests, where it extends through the rectus abdominus muscle with small fluid collection and  with scarring extending to the skin surface. This may represent a cutaneous fistula and is likely corresponding to the site of a previous surgical drain. This fluid collection could represent an abscess or a pseudo cyst relating to breakdown of the pancreatic margin of resection. Postoperative fluid collection felt less likely due to timing. Spleen: Normal in size without focal abnormality. Adrenals/Urinary Tract: 2 stones in the lower pole right kidney, largest measuring 5.6 mm diameter. No hydronephrosis. Renal nephrograms are symmetrical. Bladder wall is not thickened. Stomach/Bowel: Stomach is within normal limits. Appendix appears normal. No evidence of bowel wall thickening, distention, or inflammatory changes. Postoperative partial colectomy with anastomosis demonstrated at the mid transverse region. Vascular/Lymphatic: Aortic atherosclerosis. No enlarged abdominal or pelvic lymph nodes. Reproductive: Prostate is unremarkable. Other: No free air in the abdomen. Postoperative changes along the midline consistent with recent surgery. Musculoskeletal: Degenerative changes in the spine. No destructive bone lesions. Review of the MIP images confirms the above findings. IMPRESSION: 1. No evidence of significant pulmonary embolus. 2. Bilateral pleural effusions with basilar atelectasis or consolidation, greater on the left. 3. Large fluid collection in the left upper quadrant with adjacent stranding and small subdiaphragmatic fluid. This likely represents either abscess or pseudocyst related to breakdown of pancreatic margin of resection. 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 4. Nonobstructing intrarenal stones in the right kidney. These results were called by telephone at the time of interpretation on  11/26/2015 at 6:47 am to Dr. Veatrice Kells , who verbally acknowledged these results. Electronically Signed   By: Lucienne Capers M.D.   On:  11/26/2015 06:48   Ct Abdomen Pelvis W Contrast  Result Date: 11/26/2015 CLINICAL DATA:  Sudden onset left lower rib pain after surgery to remove a metastatic mass in the tail of the pancreas on 11/05/2015. Metastatic colon cancer. Previous right hemicolectomy. History of hypertension, ulcerative colitis, umbilical hernia repair, cholecystectomy. EXAM: CT ANGIOGRAPHY CHEST CT ABDOMEN AND PELVIS WITH CONTRAST TECHNIQUE: Multidetector CT imaging of the chest was performed using the standard protocol during bolus administration of intravenous contrast. Multiplanar CT image reconstructions and MIPs were obtained to evaluate the vascular anatomy. Multidetector CT imaging of the abdomen and pelvis was performed using the standard protocol during bolus administration of intravenous contrast. CONTRAST:  100 mL Isovue 370 COMPARISON:  CT chest abdomen and pelvis 09/17/2015. PET-CT scan 10/10/2015. FINDINGS: CTA CHEST FINDINGS Cardiovascular: Satisfactory opacification of the pulmonary arteries to the segmental level. No evidence of pulmonary embolism. Normal heart size. No pericardial effusion. Aortic atherosclerosis. No aortic aneurysm or dissection. No pericardial effusion. Mediastinum/Nodes: No enlarged mediastinal, hilar, or axillary lymph nodes. Thyroid gland, trachea, and esophagus demonstrate no significant findings. Small esophageal hiatal hernia. Lungs/Pleura: Small bilateral pleural effusions with basilar atelectasis, greater on the right. No pneumothorax. Airways are patent. Musculoskeletal: Degenerative changes in the spine. No destructive bone lesions. Review of the MIP images confirms the above findings. CT ABDOMEN and PELVIS FINDINGS Hepatobiliary: No focal liver abnormality is seen. Status post cholecystectomy. No biliary dilatation. Mild diffuse fatty infiltration of the liver. Pancreas: Postoperative changes with resection of the pancreatic tail. Normal enhancement of the remaining pancreatic tissue  without ductal dilatation. There is a circumscribed fluid collection with thin enhancing wall arising in the left upper quadrant adjacent to the pancreatic tail at the margin of resection. The collection measures about 6.7 x 9.8 x 9.9 cm. There is surrounding infiltration in the left upper quadrant fat with a small amount of free fluid under the left hemidiaphragm. There appears to be a fistula extending from a fluid collection along the left flank down to the level of the the iliac crests, where it extends through the rectus abdominus muscle with small fluid collection and with scarring extending to the skin surface. This may represent a cutaneous fistula and is likely corresponding to the site of a previous surgical drain. This fluid collection could represent an abscess or a pseudo cyst relating to breakdown of the pancreatic margin of resection. Postoperative fluid collection felt less likely due to timing. Spleen: Normal in size without focal abnormality. Adrenals/Urinary Tract: 2 stones in the lower pole right kidney, largest measuring 5.6 mm diameter. No hydronephrosis. Renal nephrograms are symmetrical. Bladder wall is not thickened. Stomach/Bowel: Stomach is within normal limits. Appendix appears normal. No evidence of bowel wall thickening, distention, or inflammatory changes. Postoperative partial colectomy with anastomosis demonstrated at the mid transverse region. Vascular/Lymphatic: Aortic atherosclerosis. No enlarged abdominal or pelvic lymph nodes. Reproductive: Prostate is unremarkable. Other: No free air in the abdomen. Postoperative changes along the midline consistent with recent surgery. Musculoskeletal: Degenerative changes in the spine. No destructive bone lesions. Review of the MIP images confirms the above findings. IMPRESSION: 1. No evidence of significant pulmonary embolus. 2. Bilateral pleural effusions with basilar atelectasis or consolidation, greater on the left. 3. Large fluid  collection in the left upper quadrant with adjacent stranding and small subdiaphragmatic fluid. This likely represents either abscess  or pseudocyst related to breakdown of pancreatic margin of resection. 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 4. Nonobstructing intrarenal stones in the right kidney. These results were called by telephone at the time of interpretation on 11/26/2015 at 6:47 am to Dr. Veatrice Kells , who verbally acknowledged these results. Electronically Signed   By: Lucienne Capers M.D.   On: 11/26/2015 06:48   Ct Image Guided Drainage By Percutaneous Catheter  Result Date: 11/26/2015 INDICATION: 70 year old male with a history of distal pancreatectomy and development of abscess/pseudocyst. EXAM: CT GUIDED DRAINAGE OF PANCREATIC CYST ABSCESS MEDICATIONS: The patient is currently admitted to the hospital and receiving intravenous antibiotics. The antibiotics were administered within an appropriate time frame prior to the initiation of the procedure. 4 mg IV Zofran, 25 mg Demerol ANESTHESIA/SEDATION: 1.0 mg IV Versed 100 mcg IV Fentanyl Moderate Sedation Time:  21 The patient was continuously monitored during the procedure by the interventional radiology nurse under my direct supervision. COMPLICATIONS: None TECHNIQUE: Informed written consent was obtained from the patient after a thorough discussion of the procedural risks, benefits and alternatives. All questions were addressed. Maximal Sterile Barrier Technique was utilized including caps, mask, sterile gowns, sterile gloves, sterile drape, hand hygiene and skin antiseptic. A timeout was performed prior to the initiation of the procedure. PROCEDURE: The left upper quad was prepped with chlorhexidine in a sterile fashion, and a sterile drape was applied covering the operative field. A sterile gown and sterile gloves were used for the procedure. Local anesthesia was provided  with 1% Lidocaine. Once the patient is prepped and draped sterilely, the skin and subcutaneous tissues were generously infiltrated 1% lidocaine for local anesthesia. Using CT guidance, an 18 gauge trocar needle was advanced into the pancreatic cyst. Using modified Seldinger technique a 12 French drainage catheter was placed into the cyst. Approximately 225 cc of gray age viscous fluid was aspirated/evacuated. The catheter was sutured in position. Sample was sent to the lab for analysis. During the case the patient experienced referred pain to the left shoulder, and complained of nausea. He remained hemodynamically stable, and was treated medically. No significant blood loss.  No complications. FINDINGS: Scout CT demonstrates fluid collection similar in size to the comparison CT. Images during the case demonstrate placement of 12 French drainage catheter with decompression of the cyst/collection. IMPRESSION: Status post CT-guided drainage of pancreatic cyst with evacuation of approximately 225 cc of viscous gray fluid. Sample was sent to the lab for analysis. Signed, Dulcy Fanny. Cody Newport, DO [facility] Electronically Signed   By: Corrie Mckusick D.O.   On: 11/26/2015 15:17    Labs:  CBC:  Recent Labs  11/07/15 0521 11/08/15 0433 11/26/15 0510 11/26/15 0518 11/27/15 0508  WBC 20.4* 16.1* 22.8*  --  25.7*  HGB 11.6* 10.8* 12.4* 13.6 11.5*  HCT 35.9* 33.0* 36.7* 40.0 35.8*  PLT 210 230 731*  --  750*    COAGS:  Recent Labs  10/04/15 0720 11/05/15 0523 11/26/15 1048  INR 1.00 1.04 1.59    BMP:  Recent Labs  11/07/15 0521 11/08/15 0433 11/26/15 0510 11/26/15 0518 11/27/15 0508  NA 137 138 134* 135 133*  K 4.2 3.9 4.2 4.3 5.2*  CL 107 108 100* 99* 101  CO2 24 24 25   --  24  GLUCOSE 152* 125* 139* 136* 208*  BUN 14 10 13 13 11   CALCIUM 7.6* 7.5* 9.0  --  8.2*  CREATININE 0.92 0.88 1.24 1.10 1.15  GFRNONAA >60 >60 57*  --  >60   GFRAA >60 >60 >60  --  >60    LIVER FUNCTION TESTS:  Recent Labs  10/09/15 1434 11/06/15 0323 11/26/15 0510  BILITOT 0.4 0.8 0.4  AST 18 34 26  ALT 19 26 21   ALKPHOS 42 38 68  PROT 6.8 6.4* 8.0  ALBUMIN 3.9 3.5 3.1*    Assessment and Plan:  S/P Percutaneous left upper abdominal drain placement.  Gravity drain with 25 mL bloody output this AM.  Patient reports nausea and continued pain.  Radiology will continue to follow for ongoing assessment of drain.  Continue BID-TID sterile saline flushes, record daily output, routine care.    Electronically Signed: Docia Barrier 11/27/2015, 9:43 AM   I spent a total of 15 Minutes at the the patient's bedside AND on the patient's hospital floor or unit, greater than 50% of which was counseling/coordinating care for left abdominal drain.    Patient ID: Cody Suarez, male   DOB: 10-02-1945, 70 y.o.   MRN: [id number]

## 2015-11-27 NOTE — Progress Notes (Signed)
[facility] Progress Note     Subjective: Denies abdominal pain or soreness around drian site. Denies complications with drain overnight. Denies leakage from previous LLQ drain site. Tolerating clear liquids but experiences nausea after eating. Denies vomiting. +flatus. Last BM was yesterday prior to hospital arrival.  Objective: Vital signs in last 24 hours: Temp:  [97.8 F (36.6 C)-98.5 F (36.9 C)] 98.5 F (36.9 C) (11/21 2126) Pulse Rate:  [86-112] 104 (11/21 2126) Resp:  [14-24] 16 (11/21 2126) BP: (122-184)/(65-90) 139/66 (11/21 2126) SpO2:  [91 %-97 %] 92 % (11/21 2126) Last BM Date: 11/26/15  Intake/Output from previous day: 11/21 0701 - 11/22 0700 In: 2180 [P.O.:300; I.V.:1820; IV Piggyback:50] Out: 630 [Urine:600; Drains:30] Intake/Output this shift: Total I/O In: 60 [P.O.:60] Out: 25 [Drains:25]  PE: Gen:  Alert, NAD, pleasant and cooperative Pulm:  CTA, no W/R/R Abd: Soft, NT, LUQ drain in place w/ dressing c/d/i, +BS, drain with minimal sanguinous drainage, well-healing laparotomy scar  Lab Results:   Recent Labs  11/26/15 0510 11/26/15 0518 11/27/15 0508  WBC 22.8*  --  25.7*  HGB 12.4* 13.6 11.5*  HCT 36.7* 40.0 35.8*  PLT 731*  --  750*   BMET  Recent Labs  11/26/15 0510 11/26/15 0518 11/27/15 0508  NA 134* 135 133*  K 4.2 4.3 5.2*  CL 100* 99* 101  CO2 25  --  24  GLUCOSE 139* 136* 208*  BUN 13 13 11   CREATININE 1.24 1.10 1.15  CALCIUM 9.0  --  8.2*   PT/INR  Recent Labs  11/26/15 1048  LABPROT 19.1*  INR 1.59   CMP     Component Value Date/Time   NA 133 (L) 11/27/2015 0508   NA 139 09/17/2015 1357   K 5.2 (H) 11/27/2015 0508   K 4.3 09/17/2015 1357   CL 101 11/27/2015 0508   CO2 24 11/27/2015 0508   CO2 24 09/17/2015 1357   GLUCOSE 208 (H) 11/27/2015 0508   GLUCOSE 108 09/17/2015 1357   BUN 11 11/27/2015 0508   BUN 18.3 09/17/2015 1357   CREATININE 1.15 11/27/2015 0508   CREATININE 1.14 10/09/2015 1434    CREATININE 1.2 09/17/2015 1357   CALCIUM 8.2 (L) 11/27/2015 0508   CALCIUM 9.7 09/17/2015 1357   PROT 8.0 11/26/2015 0510   PROT 7.0 07/18/2014 1302   ALBUMIN 3.1 (L) 11/26/2015 0510   ALBUMIN 3.7 07/18/2014 1302   AST 26 11/26/2015 0510   AST 18 07/18/2014 1302   ALT 21 11/26/2015 0510   ALT 22 07/18/2014 1302   ALKPHOS 68 11/26/2015 0510   ALKPHOS 45 07/18/2014 1302   BILITOT 0.4 11/26/2015 0510   BILITOT 0.47 07/18/2014 1302   GFRNONAA >60 11/27/2015 0508   GFRAA >60 11/27/2015 0508   Lipase     Component Value Date/Time   LIPASE 27 11/26/2015 0510       Studies/Results: Ct Angio Chest Pe W And/or Wo Contrast  Result Date: 11/26/2015 CLINICAL DATA:  Sudden onset left lower rib pain after surgery to remove a metastatic mass in the tail of the pancreas on 11/05/2015. Metastatic colon cancer. Previous right hemicolectomy. History of hypertension, ulcerative colitis, umbilical hernia repair, cholecystectomy. EXAM: CT ANGIOGRAPHY CHEST CT ABDOMEN AND PELVIS WITH CONTRAST TECHNIQUE: Multidetector CT imaging of the chest was performed using the standard protocol during bolus administration of intravenous contrast. Multiplanar CT image reconstructions and MIPs were obtained to evaluate the vascular anatomy. Multidetector CT imaging of the abdomen and pelvis was performed using  the standard protocol during bolus administration of intravenous contrast. CONTRAST:  100 mL Isovue 370 COMPARISON:  CT chest abdomen and pelvis 09/17/2015. PET-CT scan 10/10/2015. FINDINGS: CTA CHEST FINDINGS Cardiovascular: Satisfactory opacification of the pulmonary arteries to the segmental level. No evidence of pulmonary embolism. Normal heart size. No pericardial effusion. Aortic atherosclerosis. No aortic aneurysm or dissection. No pericardial effusion. Mediastinum/Nodes: No enlarged mediastinal, hilar, or axillary lymph nodes. Thyroid gland, trachea, and esophagus demonstrate no significant findings. Small  esophageal hiatal hernia. Lungs/Pleura: Small bilateral pleural effusions with basilar atelectasis, greater on the right. No pneumothorax. Airways are patent. Musculoskeletal: Degenerative changes in the spine. No destructive bone lesions. Review of the MIP images confirms the above findings. CT ABDOMEN and PELVIS FINDINGS Hepatobiliary: No focal liver abnormality is seen. Status post cholecystectomy. No biliary dilatation. Mild diffuse fatty infiltration of the liver. Pancreas: Postoperative changes with resection of the pancreatic tail. Normal enhancement of the remaining pancreatic tissue without ductal dilatation. There is a circumscribed fluid collection with thin enhancing wall arising in the left upper quadrant adjacent to the pancreatic tail at the margin of resection. The collection measures about 6.7 x 9.8 x 9.9 cm. There is surrounding infiltration in the left upper quadrant fat with a small amount of free fluid under the left hemidiaphragm. There appears to be a fistula extending from a fluid collection along the left flank down to the level of the the iliac crests, where it extends through the rectus abdominus muscle with small fluid collection and with scarring extending to the skin surface. This may represent a cutaneous fistula and is likely corresponding to the site of a previous surgical drain. This fluid collection could represent an abscess or a pseudo cyst relating to breakdown of the pancreatic margin of resection. Postoperative fluid collection felt less likely due to timing. Spleen: Normal in size without focal abnormality. Adrenals/Urinary Tract: 2 stones in the lower pole right kidney, largest measuring 5.6 mm diameter. No hydronephrosis. Renal nephrograms are symmetrical. Bladder wall is not thickened. Stomach/Bowel: Stomach is within normal limits. Appendix appears normal. No evidence of bowel wall thickening, distention, or inflammatory changes. Postoperative partial colectomy with  anastomosis demonstrated at the mid transverse region. Vascular/Lymphatic: Aortic atherosclerosis. No enlarged abdominal or pelvic lymph nodes. Reproductive: Prostate is unremarkable. Other: No free air in the abdomen. Postoperative changes along the midline consistent with recent surgery. Musculoskeletal: Degenerative changes in the spine. No destructive bone lesions. Review of the MIP images confirms the above findings. IMPRESSION: 1. No evidence of significant pulmonary embolus. 2. Bilateral pleural effusions with basilar atelectasis or consolidation, greater on the left. 3. Large fluid collection in the left upper quadrant with adjacent stranding and small subdiaphragmatic fluid. This likely represents either abscess or pseudocyst related to breakdown of pancreatic margin of resection. 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 4. Nonobstructing intrarenal stones in the right kidney. These results were called by telephone at the time of interpretation on 11/26/2015 at 6:47 am to Dr. Veatrice Kells , who verbally acknowledged these results. Electronically Signed   By: Lucienne Capers M.D.   On: 11/26/2015 06:48   Ct Abdomen Pelvis W Contrast  Result Date: 11/26/2015 CLINICAL DATA:  Sudden onset left lower rib pain after surgery to remove a metastatic mass in the tail of the pancreas on 11/05/2015. Metastatic colon cancer. Previous right hemicolectomy. History of hypertension, ulcerative colitis, umbilical hernia repair, cholecystectomy. EXAM: CT ANGIOGRAPHY CHEST CT ABDOMEN AND PELVIS  WITH CONTRAST TECHNIQUE: Multidetector CT imaging of the chest was performed using the standard protocol during bolus administration of intravenous contrast. Multiplanar CT image reconstructions and MIPs were obtained to evaluate the vascular anatomy. Multidetector CT imaging of the abdomen and pelvis was performed using the standard protocol during bolus  administration of intravenous contrast. CONTRAST:  100 mL Isovue 370 COMPARISON:  CT chest abdomen and pelvis 09/17/2015. PET-CT scan 10/10/2015. FINDINGS: CTA CHEST FINDINGS Cardiovascular: Satisfactory opacification of the pulmonary arteries to the segmental level. No evidence of pulmonary embolism. Normal heart size. No pericardial effusion. Aortic atherosclerosis. No aortic aneurysm or dissection. No pericardial effusion. Mediastinum/Nodes: No enlarged mediastinal, hilar, or axillary lymph nodes. Thyroid gland, trachea, and esophagus demonstrate no significant findings. Small esophageal hiatal hernia. Lungs/Pleura: Small bilateral pleural effusions with basilar atelectasis, greater on the right. No pneumothorax. Airways are patent. Musculoskeletal: Degenerative changes in the spine. No destructive bone lesions. Review of the MIP images confirms the above findings. CT ABDOMEN and PELVIS FINDINGS Hepatobiliary: No focal liver abnormality is seen. Status post cholecystectomy. No biliary dilatation. Mild diffuse fatty infiltration of the liver. Pancreas: Postoperative changes with resection of the pancreatic tail. Normal enhancement of the remaining pancreatic tissue without ductal dilatation. There is a circumscribed fluid collection with thin enhancing wall arising in the left upper quadrant adjacent to the pancreatic tail at the margin of resection. The collection measures about 6.7 x 9.8 x 9.9 cm. There is surrounding infiltration in the left upper quadrant fat with a small amount of free fluid under the left hemidiaphragm. There appears to be a fistula extending from a fluid collection along the left flank down to the level of the the iliac crests, where it extends through the rectus abdominus muscle with small fluid collection and with scarring extending to the skin surface. This may represent a cutaneous fistula and is likely corresponding to the site of a previous surgical drain. This fluid collection could  represent an abscess or a pseudo cyst relating to breakdown of the pancreatic margin of resection. Postoperative fluid collection felt less likely due to timing. Spleen: Normal in size without focal abnormality. Adrenals/Urinary Tract: 2 stones in the lower pole right kidney, largest measuring 5.6 mm diameter. No hydronephrosis. Renal nephrograms are symmetrical. Bladder wall is not thickened. Stomach/Bowel: Stomach is within normal limits. Appendix appears normal. No evidence of bowel wall thickening, distention, or inflammatory changes. Postoperative partial colectomy with anastomosis demonstrated at the mid transverse region. Vascular/Lymphatic: Aortic atherosclerosis. No enlarged abdominal or pelvic lymph nodes. Reproductive: Prostate is unremarkable. Other: No free air in the abdomen. Postoperative changes along the midline consistent with recent surgery. Musculoskeletal: Degenerative changes in the spine. No destructive bone lesions. Review of the MIP images confirms the above findings. IMPRESSION: 1. No evidence of significant pulmonary embolus. 2. Bilateral pleural effusions with basilar atelectasis or consolidation, greater on the left. 3. Large fluid collection in the left upper quadrant with adjacent stranding and small subdiaphragmatic fluid. This likely represents either abscess or pseudocyst related to breakdown of pancreatic margin of resection. 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 4. Nonobstructing intrarenal stones in the right kidney. These results were called by telephone at the time of interpretation on 11/26/2015 at 6:47 am to Dr. Veatrice Kells , who verbally acknowledged these results. Electronically Signed   By: Lucienne Capers M.D.   On: 11/26/2015 06:48   Ct Image Guided Drainage By Percutaneous Catheter  Result Date: 11/26/2015  INDICATION: 70 year old male with a history of distal pancreatectomy and development of  abscess/pseudocyst. EXAM: CT GUIDED DRAINAGE OF PANCREATIC CYST ABSCESS MEDICATIONS: The patient is currently admitted to the hospital and receiving intravenous antibiotics. The antibiotics were administered within an appropriate time frame prior to the initiation of the procedure. 4 mg IV Zofran, 25 mg Demerol ANESTHESIA/SEDATION: 1.0 mg IV Versed 100 mcg IV Fentanyl Moderate Sedation Time:  21 The patient was continuously monitored during the procedure by the interventional radiology nurse under my direct supervision. COMPLICATIONS: None TECHNIQUE: Informed written consent was obtained from the patient after a thorough discussion of the procedural risks, benefits and alternatives. All questions were addressed. Maximal Sterile Barrier Technique was utilized including caps, mask, sterile gowns, sterile gloves, sterile drape, hand hygiene and skin antiseptic. A timeout was performed prior to the initiation of the procedure. PROCEDURE: The left upper quad was prepped with chlorhexidine in a sterile fashion, and a sterile drape was applied covering the operative field. A sterile gown and sterile gloves were used for the procedure. Local anesthesia was provided with 1% Lidocaine. Once the patient is prepped and draped sterilely, the skin and subcutaneous tissues were generously infiltrated 1% lidocaine for local anesthesia. Using CT guidance, an 18 gauge trocar needle was advanced into the pancreatic cyst. Using modified Seldinger technique a 12 French drainage catheter was placed into the cyst. Approximately 225 cc of gray age viscous fluid was aspirated/evacuated. The catheter was sutured in position. Sample was sent to the lab for analysis. During the case the patient experienced referred pain to the left shoulder, and complained of nausea. He remained hemodynamically stable, and was treated medically. No significant blood loss.  No complications. FINDINGS: Scout CT demonstrates fluid collection similar in size to  the comparison CT. Images during the case demonstrate placement of 12 French drainage catheter with decompression of the cyst/collection. IMPRESSION: Status post CT-guided drainage of pancreatic cyst with evacuation of approximately 225 cc of viscous gray fluid. Sample was sent to the lab for analysis. Signed, Dulcy Fanny. Earleen Newport, DO [facility] Electronically Signed   By: Corrie Mckusick D.O.   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 [name]           S/p IR placement of percutaneous drain - 225 mL bloody grayish fluid; Culture pending - G+ cocci              Drain: 55cc/24h   Drain Amylase pending  Prealbumin pending  WBC 25.7   Hyperkalemia - mild, change IVF to D5 1/2 NS w/OUT potassium HTN - will resume home meds s/p anticipated perc drain, PRN hydralazine  PMH arterial thromboembolism - Xarelto held; per vascular surgery does not necessarily need full therapeutic anticoagulation  FEN: clear liquid  diet ID: Zosyn VTE: Lovenox 40 mg daily, SCD's  Dispo: clears as tolerated, follow cultures/amylase, follow nutritional labs, cont IV abx  CBC, BMET in AM   LOS: 0 days    Jill Alexanders , [facility] 11/27/2015, 10:06 AM Pager: [phone] Consults:  [phone] Mon-Fri 7:00 am-4:30 pm Sat-Sun 7:00 am-11:30 am

## 2015-11-27 NOTE — Evaluation (Signed)
Physical Therapy Evaluation Patient Details Name: Cody Suarez MRN: [id number] DOB: 02/07/1945 Today's Date: 11/27/2015   History of Present Illness  Pt admit with increase pain in abdomen, decreased appetite and nauseous. Pt with recent resection fo mass at tail of pancreas on 11/05/2015  and DC home with JP drain. Had begun to get stronger and walking in yard with wife, until last week with pain and nauseous.   Clinical Impression   Pt with generalized weakness with decreased ability with general mobility. To benefit from continue PT while here and depending on pt's progression may or may not need HHPT to follow. Will continue to assess.    Follow Up Recommendations Other (comment) (pt may be able to progress on his own depending progress here)    Equipment Recommendations  None recommended by PT    Recommendations for Other Services       Precautions / Restrictions Precautions Precautions: None Precaution Comments: L abdominal drain in place       Mobility  Bed Mobility Overal bed mobility: Needs Assistance Bed Mobility: Supine to Sit;Sit to Supine     Supine to sit: Min assist Sit to supine: Min assist   General bed mobility comments: asssit with upper body and LEs for it is painful iin abdomen and difficulty with R LE due to past surgery per patient.   Transfers Overall transfer level: Needs assistance Equipment used: Rolling walker (2 wheeled) Transfers: Sit to/from Stand Sit to Stand: Min assist         General transfer comment: to assist up from low surface and incrase time due to patient guarding   Ambulation/Gait Ambulation/Gait assistance: Min assist Ambulation Distance (Feet): 10 Feet (to bathroom and back . Limited distance during this session due to patient really fatigued after sitting up in chair for a few hours. Educated to walk tonight or tomorrow with nursing staff assistance ) Assistive device: Rolling walker (2 wheeled) Gait  Pattern/deviations: Step-through pattern     General Gait Details: slow, and weight is posterior through heels with not much roll over tranfer weight through toes . Therefore would have LOB easily posteriorly if pertubation was to occur.   Stairs            Wheelchair Mobility    Modified Rankin (Stroke Patients Only)       Balance                                             Pertinent Vitals/Pain Pain Assessment: 0-10 Pain Score: 2  Pain Location: abdomen, but tolerable Pain Descriptors / Indicators: Aching Pain Intervention(s): Monitored during session    Home Living Family/patient expects to be discharged to:: Private residence Living Arrangements: Spouse/significant other Available Help at Discharge: Family Type of Home: House Home Access: Stairs to enter Entrance Stairs-Rails: Can reach both Entrance Stairs-Number of Steps: 4 Home Layout: One level Home Equipment: Environmental consultant - 2 wheels;Shower seat      Prior Function Level of Independence: Independent         Comments: before Oct pateint very indepedent and working. Wife is CNA , but now only works 1 day a week.      Hand Dominance        Extremity/Trunk Assessment               Lower Extremity Assessment: Generalized weakness  Communication   Communication: HOH  Cognition Arousal/Alertness: Awake/alert Behavior During Therapy: WFL for tasks assessed/performed Overall Cognitive Status: Within Functional Limits for tasks assessed                      General Comments      Exercises     Assessment/Plan    PT Assessment Patient needs continued PT services  PT Problem List Decreased strength;Decreased activity tolerance          PT Treatment Interventions Functional mobility training;Therapeutic activities    PT Goals (Current goals can be found in the Care Plan section)  Acute Rehab PT Goals Patient Stated Goal: I want to get stronger and be  able to walk and eat.  PT Goal Formulation: With patient/family Time For Goal Achievement: 12/11/15 Potential to Achieve Goals: Good    Frequency Min 3X/week   Barriers to discharge        Co-evaluation               End of Session   Activity Tolerance: Patient tolerated treatment well;Patient limited by fatigue Patient left: in bed;with family/visitor present;with call bell/phone within reach Nurse Communication: Mobility status    Functional Assessment Tool Used: clinical judgement  Functional Limitation: Mobility: Walking and moving around Mobility: Walking and Moving Around Current Status (E7517): At least 1 percent but less than 20 percent impaired, limited or restricted Mobility: Walking and Moving Around Goal Status [phone]): 0 percent impaired, limited or restricted    Time: 1450-1511 PT Time Calculation (min) (ACUTE ONLY): 21 min   Charges:   PT Evaluation $PT Eval Low Complexity: 1 Procedure     PT G Codes:   PT G-Codes **NOT FOR INPATIENT CLASS** Functional Assessment Tool Used: clinical judgement  Functional Limitation: Mobility: Walking and moving around Mobility: Walking and Moving Around Current Status (B4496): At least 1 percent but less than 20 percent impaired, limited or restricted Mobility: Walking and Moving Around Goal Status [phone]): 0 percent impaired, limited or restricted    Kodie Kishi, [facility] 11/27/2015, 4:22 PM Clide Dales, PT Pager: [phone] 11/27/2015

## 2015-11-28 ENCOUNTER — Encounter (HOSPITAL_COMMUNITY): Payer: Self-pay | Admitting: Surgery

## 2015-11-28 DIAGNOSIS — Z87442 Personal history of urinary calculi: Secondary | ICD-10-CM | POA: Diagnosis present

## 2015-11-28 DIAGNOSIS — C7889 Secondary malignant neoplasm of other digestive organs: Secondary | ICD-10-CM

## 2015-11-28 LAB — BASIC METABOLIC PANEL
ANION GAP: 8 (ref 5–15)
BUN: 15 mg/dL (ref 6–20)
CALCIUM: 7.6 mg/dL — AB (ref 8.9–10.3)
CO2: 23 mmol/L (ref 22–32)
Chloride: 99 mmol/L — ABNORMAL LOW (ref 101–111)
Creatinine, Ser: 1.58 mg/dL — ABNORMAL HIGH (ref 0.61–1.24)
GFR, EST AFRICAN AMERICAN: 49 mL/min — AB (ref >60)
GFR, EST NON AFRICAN AMERICAN: 43 mL/min — AB (ref >60)
GLUCOSE: 171 mg/dL — AB (ref 65–99)
POTASSIUM: 4.2 mmol/L (ref 3.5–5.1)
Sodium: 130 mmol/L — ABNORMAL LOW (ref 135–145)

## 2015-11-28 LAB — AMYLASE, PERITONEAL FLUID: Amylase, peritoneal fluid: 12967 U/L

## 2015-11-28 LAB — CBC
HEMATOCRIT: 29.8 % — AB (ref 39.0–52.0)
Hemoglobin: 10.1 g/dL — ABNORMAL LOW (ref 13.0–17.0)
MCH: 29.8 pg (ref 26.0–34.0)
MCHC: 33.9 g/dL (ref 30.0–36.0)
MCV: 87.9 fL (ref 78.0–100.0)
Platelets: 563 10*3/uL — ABNORMAL HIGH (ref 150–400)
RBC: 3.39 MIL/uL — AB (ref 4.22–5.81)
RDW: 15.2 % (ref 11.5–15.5)
WBC: 23.6 10*3/uL — AB (ref 4.0–10.5)

## 2015-11-28 MED ORDER — UNJURY CHICKEN SOUP POWDER
11.0000 [oz_av] | Freq: Three times a day (TID) | ORAL | Status: DC
  Filled 2015-11-28 (×2): qty 54

## 2015-11-28 MED ORDER — UNJURY CHICKEN SOUP POWDER
11.0000 [oz_av] | Freq: Three times a day (TID) | ORAL | Status: DC
  Administered 2015-11-28: 11 [oz_av] via ORAL
  Filled 2015-11-28 (×3): qty 54

## 2015-11-28 MED ORDER — VANCOMYCIN HCL IN DEXTROSE 750-5 MG/150ML-% IV SOLN
750.0000 mg | Freq: Two times a day (BID) | INTRAVENOUS | Status: DC
  Administered 2015-11-28 (×2): 750 mg via INTRAVENOUS
  Filled 2015-11-28 (×3): qty 150

## 2015-11-28 NOTE — Progress Notes (Signed)
Pharmacy Antibiotic Note  Cody Suarez is a 70 y.o. male admitted on 11/26/2015 with intraabdominal abscess vs infected peripancreatic fluid collection s/p abdominal surgery and IR perc drain placement.  He was been on Zosyn since admission, now Pharmacy has been consulted for Vancomycin dosing with Staph growing in abscess culture.  Plan:  Vancomycin 750 mg IV q12h.  Measure Vanc trough at steady state.  Follow up renal fxn, culture results, and clinical course.    Height: 5' 10"  (177.8 cm) Weight: 187 lb (84.8 kg) (t stated he wasn't sure but thats what he weighed a few week) IBW/kg (Calculated) : 73  Temp (24hrs), Avg:98.1 F (36.7 C), Min:98 F (36.7 C), Max:98.1 F (36.7 C)   Recent Labs Lab 11/26/15 0510 11/26/15 0518 11/27/15 0508 11/28/15 0425  WBC 22.8*  --  25.7* 23.6*  CREATININE 1.24 1.10 1.15 1.58*    Estimated Creatinine Clearance: 44.9 mL/min (by C-G formula based on SCr of 1.58 mg/dL (H)).    Allergies  Allergen Reactions  . Amlodipine Swelling    Right LE edema  . Gabapentin Rash    Short-term memory decrease    Antimicrobials this admission: 11/21 Vancomycin x1 dose only 11/21 Zosyn >>  11/23 Vancomycin >>   Dose adjustments this admission:  Microbiology results: 11/21 BCx: ngtd 11/21 abscess: staph aureus (sens pending)  Thank you for allowing pharmacy to be a part of this patient's care.  Gretta Arab PharmD, BCPS Pager [phone] 11/28/2015 10:44 AM

## 2015-11-28 NOTE — Progress Notes (Signed)
Subjective: +Flatus. Protein shakes too sweet. No n/v. Had some applesauce. Working on breeze shake but really sweet  Objective: Vital signs in last 24 hours: Temp:  [98 F (36.7 C)-98.1 F (36.7 C)] 98 F (36.7 C) (11/23 0509) Pulse Rate:  [63-85] 85 (11/23 0509) Resp:  [15-16] 16 (11/23 0509) BP: (96-121)/(51-66) 121/66 (11/23 0509) SpO2:  [98 %-99 %] 98 % (11/23 0509) Weight:  [84.8 kg (187 lb)] 84.8 kg (187 lb) (11/22 2019) Last BM Date: 11/27/15  Intake/Output from previous day: 11/22 0701 - 11/23 0700 In: 3091.7 [P.O.:930; I.V.:2061.7; IV Piggyback:100] Out: 540 [Urine:450; Drains:90] Intake/Output this shift: No intake/output data recorded.  Alert, nad cta  Reg Soft, nt, nd; drain -serosang No edema  Lab Results:   Recent Labs  11/27/15 0508 11/28/15 0425  WBC 25.7* 23.6*  HGB 11.5* 10.1*  HCT 35.8* 29.8*  PLT 750* 563*   BMET  Recent Labs  11/27/15 0508 11/28/15 0425  NA 133* 130*  K 5.2* 4.2  CL 101 99*  CO2 24 23  GLUCOSE 208* 171*  BUN 11 15  CREATININE 1.15 1.58*  CALCIUM 8.2* 7.6*   PT/INR  Recent Labs  11/26/15 1048  LABPROT 19.1*  INR 1.59   ABG No results for input(s): PHART, HCO3 in the last 72 hours.  Invalid input(s): PCO2, PO2  Studies/Results: Ct Image Guided Drainage By Percutaneous Catheter  Result Date: 11/26/2015 INDICATION: 70 year old male with a history of distal pancreatectomy and development of abscess/pseudocyst. EXAM: CT GUIDED DRAINAGE OF PANCREATIC CYST ABSCESS MEDICATIONS: The patient is currently admitted to the hospital and receiving intravenous antibiotics. The antibiotics were administered within an appropriate time frame prior to the initiation of the procedure. 4 mg IV Zofran, 25 mg Demerol ANESTHESIA/SEDATION: 1.0 mg IV Versed 100 mcg IV Fentanyl Moderate Sedation Time:  21 The patient was continuously monitored during the procedure by the interventional radiology nurse under my direct supervision.  COMPLICATIONS: None TECHNIQUE: Informed written consent was obtained from the patient after a thorough discussion of the procedural risks, benefits and alternatives. All questions were addressed. Maximal Sterile Barrier Technique was utilized including caps, mask, sterile gowns, sterile gloves, sterile drape, hand hygiene and skin antiseptic. A timeout was performed prior to the initiation of the procedure. PROCEDURE: The left upper quad was prepped with chlorhexidine in a sterile fashion, and a sterile drape was applied covering the operative field. A sterile gown and sterile gloves were used for the procedure. Local anesthesia was provided with 1% Lidocaine. Once the patient is prepped and draped sterilely, the skin and subcutaneous tissues were generously infiltrated 1% lidocaine for local anesthesia. Using CT guidance, an 18 gauge trocar needle was advanced into the pancreatic cyst. Using modified Seldinger technique a 12 French drainage catheter was placed into the cyst. Approximately 225 cc of gray age viscous fluid was aspirated/evacuated. The catheter was sutured in position. Sample was sent to the lab for analysis. During the case the patient experienced referred pain to the left shoulder, and complained of nausea. He remained hemodynamically stable, and was treated medically. No significant blood loss.  No complications. FINDINGS: Scout CT demonstrates fluid collection similar in size to the comparison CT. Images during the case demonstrate placement of 12 French drainage catheter with decompression of the cyst/collection. IMPRESSION: Status post CT-guided drainage of pancreatic cyst with evacuation of approximately 225 cc of viscous gray fluid. Sample was sent to the lab for analysis. Signed, Dulcy Fanny. Earleen Newport, DO [facility]  Electronically Signed   By: Corrie Mckusick D.O.   On: 11/26/2015 15:17    Anti-infectives: Anti-infectives    Start      Dose/Rate Route Frequency Ordered Stop   11/26/15 0830  piperacillin-tazobactam (ZOSYN) IVPB 3.375 g     3.375 g 12.5 mL/hr over 240 Minutes Intravenous Every 8 hours 11/26/15 0817     11/26/15 0645  vancomycin (VANCOCIN) IVPB 1000 mg/200 mL premix     1,000 mg 200 mL/hr over 60 Minutes Intravenous  Once 11/26/15 1025 11/26/15 0858   11/26/15 0645  piperacillin-tazobactam (ZOSYN) IVPB 3.375 g     3.375 g 100 mL/hr over 30 Minutes Intravenous  Once 11/26/15 8527 11/26/15 0825      Assessment/Plan: s/p * No surgery found * Intraabdominal abscess vs infected peripancreatic fluid collection S/p exploratory laparotomy, LOA, distal pancreatectomy 11/05/15 Dr. [name]             S/p IR placement of percutaneous drain - 225 mL bloody grayish fluid; Culture pending - G+ cocci-->staph  Drain: 45cc/24h              Drain Amylase pending                          WBC stable at around 23   Hyperkalemia - resolved HTN - will resume home meds s/p anticipated perc drain, PRN hydralazine  PMH arterial thromboembolism - Xarelto held; per vascular surgery does not necessarily need full therapeutic anticoagulation Severe protein calorie malnutrition - encouraged po, will try chicken soup shakes which aren't as sweet. Doesn't appear oral intake is increasing drain output, in fact drain output going down. If still has leak will add octreotide when amylase results. Would like to hold on picc/tpn for now Renal - small bump in Cr, repeat in AM FEN: reg diet; add chicken soup protein shakes ID: Zosyn, add vanc given staph result. Will narrow abx once suscept back VTE: Lovenox 40 mg daily, SCD's  Discussed with wife  [name]. Redmond Pulling, MD, FACS General, Bariatric, & Minimally Invasive Surgery [facility], [place]   LOS: 1 day    Cody Suarez 11/28/2015

## 2015-11-29 DIAGNOSIS — Z888 Allergy status to other drugs, medicaments and biological substances status: Secondary | ICD-10-CM

## 2015-11-29 DIAGNOSIS — K651 Peritoneal abscess: Principal | ICD-10-CM

## 2015-11-29 DIAGNOSIS — Z9049 Acquired absence of other specified parts of digestive tract: Secondary | ICD-10-CM

## 2015-11-29 DIAGNOSIS — C185 Malignant neoplasm of splenic flexure: Secondary | ICD-10-CM

## 2015-11-29 DIAGNOSIS — Z8249 Family history of ischemic heart disease and other diseases of the circulatory system: Secondary | ICD-10-CM

## 2015-11-29 DIAGNOSIS — C7889 Secondary malignant neoplasm of other digestive organs: Secondary | ICD-10-CM

## 2015-11-29 DIAGNOSIS — K8681 Exocrine pancreatic insufficiency: Secondary | ICD-10-CM

## 2015-11-29 DIAGNOSIS — A4901 Methicillin susceptible Staphylococcus aureus infection, unspecified site: Secondary | ICD-10-CM

## 2015-11-29 DIAGNOSIS — Z90411 Acquired partial absence of pancreas: Secondary | ICD-10-CM

## 2015-11-29 DIAGNOSIS — B9561 Methicillin susceptible Staphylococcus aureus infection as the cause of diseases classified elsewhere: Secondary | ICD-10-CM

## 2015-11-29 DIAGNOSIS — Z825 Family history of asthma and other chronic lower respiratory diseases: Secondary | ICD-10-CM

## 2015-11-29 LAB — CBC
HCT: 32.5 % — ABNORMAL LOW (ref 39.0–52.0)
Hemoglobin: 10.5 g/dL — ABNORMAL LOW (ref 13.0–17.0)
MCH: 28.5 pg (ref 26.0–34.0)
MCHC: 32.3 g/dL (ref 30.0–36.0)
MCV: 88.1 fL (ref 78.0–100.0)
PLATELETS: 623 10*3/uL — AB (ref 150–400)
RBC: 3.69 MIL/uL — ABNORMAL LOW (ref 4.22–5.81)
RDW: 15.7 % — AB (ref 11.5–15.5)
WBC: 18.9 10*3/uL — AB (ref 4.0–10.5)

## 2015-11-29 LAB — BASIC METABOLIC PANEL
Anion gap: 7 (ref 5–15)
BUN: 10 mg/dL (ref 6–20)
CO2: 24 mmol/L (ref 22–32)
CREATININE: 1.08 mg/dL (ref 0.61–1.24)
Calcium: 7.9 mg/dL — ABNORMAL LOW (ref 8.9–10.3)
Chloride: 99 mmol/L — ABNORMAL LOW (ref 101–111)
GFR calc Af Amer: >60 mL/min (ref >60)
Glucose, Bld: 155 mg/dL — ABNORMAL HIGH (ref 65–99)
Potassium: 3.8 mmol/L (ref 3.5–5.1)
SODIUM: 130 mmol/L — AB (ref 135–145)

## 2015-11-29 MED ORDER — BENEPROTEIN PO POWD
1.0000 | Freq: Three times a day (TID) | ORAL | Status: DC
  Filled 2015-11-29: qty 227

## 2015-11-29 MED ORDER — RESOURCE INSTANT PROTEIN PO PWD PACKET
1.0000 | Freq: Three times a day (TID) | ORAL | Status: DC
  Filled 2015-11-29 (×5): qty 6

## 2015-11-29 MED ORDER — SODIUM CHLORIDE 0.9 % IV SOLN
3.0000 g | Freq: Four times a day (QID) | INTRAVENOUS | Status: DC
  Administered 2015-11-29 – 2015-11-30 (×5): 3 g via INTRAVENOUS
  Filled 2015-11-29 (×7): qty 3

## 2015-11-29 NOTE — Progress Notes (Signed)
Patient ID: Cody Suarez, male   DOB: Nov 02, 1945, 70 y.o.   MRN: [id number]    Referring Physician(s): Dr. [name]  Supervising Physician: Corrie Mckusick  Patient Status: [facility] - [facility]  Chief Complaint: LUQ fluid collection  Subjective: Patient is very tired as he just ambulated with PT.  Not eating much.  Still having some left sided abdominal pain.  Allergies: Amlodipine and Gabapentin  Medications: Prior to Admission medications   Medication Sig Start Date End Date Taking? Authorizing Provider  budesonide (ENTOCORT EC) 3 MG 24 hr capsule Take 6 mg by mouth daily with breakfast.    Yes Historical Provider, MD  carvedilol (COREG) 12.5 MG tablet Take 6.25 mg by mouth 2 (two) times daily with a meal.   Yes Historical Provider, MD  diphenhydramine-acetaminophen (TYLENOL PM) 25-500 MG TABS Take 1-2 tablets by mouth at bedtime. Reported on 01/22/2015   Yes Historical Provider, MD  HYDROcodone-acetaminophen (NORCO/VICODIN) 5-325 MG tablet Take 1-2 tablets by mouth every 4 (four) hours as needed for moderate pain. 11/11/15  Yes [name], MD  losartan (COZAAR) 100 MG tablet Take 1 tablet (100 mg total) by mouth daily. Patient taking differently: Take 100 mg by mouth daily with breakfast.  02/06/15  Yes [name], MD  megestrol (MEGACE) 400 MG/10ML suspension Take 5 mLs (200 mg total) by mouth 2 (two) times daily. 11/22/15  Yes [name], MD  Multiple Vitamin (MULTIVITAMIN WITH MINERALS) TABS tablet Take 1 tablet by mouth daily.   Yes Historical Provider, MD  ondansetron (ZOFRAN-ODT) 4 MG disintegrating tablet Take 4 mg by mouth every 8 (eight) hours as needed for nausea or vomiting.  11/15/15  Yes Historical Provider, MD  spironolactone (ALDACTONE) 25 MG tablet TAKE 1 TABLET DAILY Patient taking differently: TAKE 1 TABLET by mouth in the morning 08/12/15  Yes [name], MD  VIAGRA 50 MG tablet Take 50 mg by mouth daily as needed for erectile dysfunction.  08/16/15  Yes  Historical Provider, MD  XARELTO 20 MG TABS tablet TAKE 1 TABLET DAILY WITH BREAKFAST 08/15/15  Yes [name], MD  carvedilol (COREG) 6.25 MG tablet Take 1 tablet (6.25 mg total) by mouth 2 (two) times daily. Patient not taking: Reported on 11/26/2015 08/12/15   [name], MD    Vital Signs: BP (!) 92/45 (BP Location: Right Arm)   Pulse 79   Temp 97.9 F (36.6 C) (Oral)   Resp 18   Ht 5' 10"  (1.778 m)   Wt 187 lb (84.8 kg) Comment: t stated he wasn't sure but thats what he weighed a few week  SpO2 90%   BMI 26.83 kg/m   Physical Exam: Abd: soft, LUQ drain in place with minimal output currently.  Residual fluid appear to be bloody brownish-colored with some debride in the tubing.  Drain site is c/d/i  Imaging: Ct Angio Chest Pe W And/or Wo Contrast  Result Date: 11/26/2015 CLINICAL DATA:  Sudden onset left lower rib pain after surgery to remove a metastatic mass in the tail of the pancreas on 11/05/2015. Metastatic colon cancer. Previous right hemicolectomy. History of hypertension, ulcerative colitis, umbilical hernia repair, cholecystectomy. EXAM: CT ANGIOGRAPHY CHEST CT ABDOMEN AND PELVIS WITH CONTRAST TECHNIQUE: Multidetector CT imaging of the chest was performed using the standard protocol during bolus administration of intravenous contrast. Multiplanar CT image reconstructions and MIPs were obtained to evaluate the vascular anatomy. Multidetector CT imaging of the abdomen and pelvis was performed using the standard protocol  during bolus administration of intravenous contrast. CONTRAST:  100 mL Isovue 370 COMPARISON:  CT chest abdomen and pelvis 09/17/2015. PET-CT scan 10/10/2015. FINDINGS: CTA CHEST FINDINGS Cardiovascular: Satisfactory opacification of the pulmonary arteries to the segmental level. No evidence of pulmonary embolism. Normal heart size. No pericardial effusion. Aortic atherosclerosis. No aortic aneurysm or dissection. No pericardial effusion.  Mediastinum/Nodes: No enlarged mediastinal, hilar, or axillary lymph nodes. Thyroid gland, trachea, and esophagus demonstrate no significant findings. Small esophageal hiatal hernia. Lungs/Pleura: Small bilateral pleural effusions with basilar atelectasis, greater on the right. No pneumothorax. Airways are patent. Musculoskeletal: Degenerative changes in the spine. No destructive bone lesions. Review of the MIP images confirms the above findings. CT ABDOMEN and PELVIS FINDINGS Hepatobiliary: No focal liver abnormality is seen. Status post cholecystectomy. No biliary dilatation. Mild diffuse fatty infiltration of the liver. Pancreas: Postoperative changes with resection of the pancreatic tail. Normal enhancement of the remaining pancreatic tissue without ductal dilatation. There is a circumscribed fluid collection with thin enhancing wall arising in the left upper quadrant adjacent to the pancreatic tail at the margin of resection. The collection measures about 6.7 x 9.8 x 9.9 cm. There is surrounding infiltration in the left upper quadrant fat with a small amount of free fluid under the left hemidiaphragm. There appears to be a fistula extending from a fluid collection along the left flank down to the level of the the iliac crests, where it extends through the rectus abdominus muscle with small fluid collection and with scarring extending to the skin surface. This may represent a cutaneous fistula and is likely corresponding to the site of a previous surgical drain. This fluid collection could represent an abscess or a pseudo cyst relating to breakdown of the pancreatic margin of resection. Postoperative fluid collection felt less likely due to timing. Spleen: Normal in size without focal abnormality. Adrenals/Urinary Tract: 2 stones in the lower pole right kidney, largest measuring 5.6 mm diameter. No hydronephrosis. Renal nephrograms are symmetrical. Bladder wall is not thickened. Stomach/Bowel: Stomach is within  normal limits. Appendix appears normal. No evidence of bowel wall thickening, distention, or inflammatory changes. Postoperative partial colectomy with anastomosis demonstrated at the mid transverse region. Vascular/Lymphatic: Aortic atherosclerosis. No enlarged abdominal or pelvic lymph nodes. Reproductive: Prostate is unremarkable. Other: No free air in the abdomen. Postoperative changes along the midline consistent with recent surgery. Musculoskeletal: Degenerative changes in the spine. No destructive bone lesions. Review of the MIP images confirms the above findings. IMPRESSION: 1. No evidence of significant pulmonary embolus. 2. Bilateral pleural effusions with basilar atelectasis or consolidation, greater on the left. 3. Large fluid collection in the left upper quadrant with adjacent stranding and small subdiaphragmatic fluid. This likely represents either abscess or pseudocyst related to breakdown of pancreatic margin of resection. 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 4. Nonobstructing intrarenal stones in the right kidney. These results were called by telephone at the time of interpretation on 11/26/2015 at 6:47 am to Dr. Veatrice Kells , who verbally acknowledged these results. Electronically Signed   By: Lucienne Capers M.D.   On: 11/26/2015 06:48   Ct Abdomen Pelvis W Contrast  Result Date: 11/26/2015 CLINICAL DATA:  Sudden onset left lower rib pain after surgery to remove a metastatic mass in the tail of the pancreas on 11/05/2015. Metastatic colon cancer. Previous right hemicolectomy. History of hypertension, ulcerative colitis, umbilical hernia repair, cholecystectomy. EXAM: CT ANGIOGRAPHY CHEST CT ABDOMEN AND PELVIS WITH CONTRAST TECHNIQUE:  Multidetector CT imaging of the chest was performed using the standard protocol during bolus administration of intravenous contrast. Multiplanar CT image reconstructions and MIPs were  obtained to evaluate the vascular anatomy. Multidetector CT imaging of the abdomen and pelvis was performed using the standard protocol during bolus administration of intravenous contrast. CONTRAST:  100 mL Isovue 370 COMPARISON:  CT chest abdomen and pelvis 09/17/2015. PET-CT scan 10/10/2015. FINDINGS: CTA CHEST FINDINGS Cardiovascular: Satisfactory opacification of the pulmonary arteries to the segmental level. No evidence of pulmonary embolism. Normal heart size. No pericardial effusion. Aortic atherosclerosis. No aortic aneurysm or dissection. No pericardial effusion. Mediastinum/Nodes: No enlarged mediastinal, hilar, or axillary lymph nodes. Thyroid gland, trachea, and esophagus demonstrate no significant findings. Small esophageal hiatal hernia. Lungs/Pleura: Small bilateral pleural effusions with basilar atelectasis, greater on the right. No pneumothorax. Airways are patent. Musculoskeletal: Degenerative changes in the spine. No destructive bone lesions. Review of the MIP images confirms the above findings. CT ABDOMEN and PELVIS FINDINGS Hepatobiliary: No focal liver abnormality is seen. Status post cholecystectomy. No biliary dilatation. Mild diffuse fatty infiltration of the liver. Pancreas: Postoperative changes with resection of the pancreatic tail. Normal enhancement of the remaining pancreatic tissue without ductal dilatation. There is a circumscribed fluid collection with thin enhancing wall arising in the left upper quadrant adjacent to the pancreatic tail at the margin of resection. The collection measures about 6.7 x 9.8 x 9.9 cm. There is surrounding infiltration in the left upper quadrant fat with a small amount of free fluid under the left hemidiaphragm. There appears to be a fistula extending from a fluid collection along the left flank down to the level of the the iliac crests, where it extends through the rectus abdominus muscle with small fluid collection and with scarring extending to the  skin surface. This may represent a cutaneous fistula and is likely corresponding to the site of a previous surgical drain. This fluid collection could represent an abscess or a pseudo cyst relating to breakdown of the pancreatic margin of resection. Postoperative fluid collection felt less likely due to timing. Spleen: Normal in size without focal abnormality. Adrenals/Urinary Tract: 2 stones in the lower pole right kidney, largest measuring 5.6 mm diameter. No hydronephrosis. Renal nephrograms are symmetrical. Bladder wall is not thickened. Stomach/Bowel: Stomach is within normal limits. Appendix appears normal. No evidence of bowel wall thickening, distention, or inflammatory changes. Postoperative partial colectomy with anastomosis demonstrated at the mid transverse region. Vascular/Lymphatic: Aortic atherosclerosis. No enlarged abdominal or pelvic lymph nodes. Reproductive: Prostate is unremarkable. Other: No free air in the abdomen. Postoperative changes along the midline consistent with recent surgery. Musculoskeletal: Degenerative changes in the spine. No destructive bone lesions. Review of the MIP images confirms the above findings. IMPRESSION: 1. No evidence of significant pulmonary embolus. 2. Bilateral pleural effusions with basilar atelectasis or consolidation, greater on the left. 3. Large fluid collection in the left upper quadrant with adjacent stranding and small subdiaphragmatic fluid. This likely represents either abscess or pseudocyst related to breakdown of pancreatic margin of resection. 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 4. Nonobstructing intrarenal stones in the right kidney. These results were called by telephone at the time of interpretation on 11/26/2015 at 6:47 am to Dr. Veatrice Kells , who verbally acknowledged these results. Electronically Signed   By: Lucienne Capers M.D.   On: 11/26/2015 06:48   Ct Image Guided  Drainage By Percutaneous Catheter  Result Date: 11/26/2015 INDICATION: 70 year old  male with a history of distal pancreatectomy and development of abscess/pseudocyst. EXAM: CT GUIDED DRAINAGE OF PANCREATIC CYST ABSCESS MEDICATIONS: The patient is currently admitted to the hospital and receiving intravenous antibiotics. The antibiotics were administered within an appropriate time frame prior to the initiation of the procedure. 4 mg IV Zofran, 25 mg Demerol ANESTHESIA/SEDATION: 1.0 mg IV Versed 100 mcg IV Fentanyl Moderate Sedation Time:  21 The patient was continuously monitored during the procedure by the interventional radiology nurse under my direct supervision. COMPLICATIONS: None TECHNIQUE: Informed written consent was obtained from the patient after a thorough discussion of the procedural risks, benefits and alternatives. All questions were addressed. Maximal Sterile Barrier Technique was utilized including caps, mask, sterile gowns, sterile gloves, sterile drape, hand hygiene and skin antiseptic. A timeout was performed prior to the initiation of the procedure. PROCEDURE: The left upper quad was prepped with chlorhexidine in a sterile fashion, and a sterile drape was applied covering the operative field. A sterile gown and sterile gloves were used for the procedure. Local anesthesia was provided with 1% Lidocaine. Once the patient is prepped and draped sterilely, the skin and subcutaneous tissues were generously infiltrated 1% lidocaine for local anesthesia. Using CT guidance, an 18 gauge trocar needle was advanced into the pancreatic cyst. Using modified Seldinger technique a 12 French drainage catheter was placed into the cyst. Approximately 225 cc of gray age viscous fluid was aspirated/evacuated. The catheter was sutured in position. Sample was sent to the lab for analysis. During the case the patient experienced referred pain to the left shoulder, and complained of nausea. He remained hemodynamically  stable, and was treated medically. No significant blood loss.  No complications. FINDINGS: Scout CT demonstrates fluid collection similar in size to the comparison CT. Images during the case demonstrate placement of 12 French drainage catheter with decompression of the cyst/collection. IMPRESSION: Status post CT-guided drainage of pancreatic cyst with evacuation of approximately 225 cc of viscous gray fluid. Sample was sent to the lab for analysis. Signed, Dulcy Fanny. Earleen Newport, DO [facility] Electronically Signed   By: Corrie Mckusick D.O.   On: 11/26/2015 15:17    Labs:  CBC:  Recent Labs  11/26/15 0510 11/26/15 0518 11/27/15 0508 11/28/15 0425 11/29/15 0433  WBC 22.8*  --  25.7* 23.6* 18.9*  HGB 12.4* 13.6 11.5* 10.1* 10.5*  HCT 36.7* 40.0 35.8* 29.8* 32.5*  PLT 731*  --  750* 563* 623*    COAGS:  Recent Labs  10/04/15 0720 11/05/15 0523 11/26/15 1048  INR 1.00 1.04 1.59    BMP:  Recent Labs  11/26/15 0510 11/26/15 0518 11/27/15 0508 11/28/15 0425 11/29/15 0433  NA 134* 135 133* 130* 130*  K 4.2 4.3 5.2* 4.2 3.8  CL 100* 99* 101 99* 99*  CO2 25  --  24 23 24   GLUCOSE 139* 136* 208* 171* 155*  BUN 13 13 11 15 10   CALCIUM 9.0  --  8.2* 7.6* 7.9*  CREATININE 1.24 1.10 1.15 1.58* 1.08  GFRNONAA 57*  --  >60 43* >60  GFRAA >60  --  >60 49* >60    LIVER FUNCTION TESTS:  Recent Labs  10/09/15 1434 11/06/15 0323 11/26/15 0510  BILITOT 0.4 0.8 0.4  AST 18 34 26  ALT 19 26 21   ALKPHOS 42 38 68  PROT 6.8 6.4* 8.0  ALBUMIN 3.9 3.5 3.1*    Assessment and Plan: 1. LUQ fluid collection, s/p perc drain on 11/21 Earleen Newport -  output appears to be consistent with pancreatic leak.  Drain fluid amylase was over 13,000.  Surgery has added octreotide -cultures reveal Staph -cont with drain in place.  Will likely be in place until the pancreatic leak resolves. -will follow.  Electronically Signed: Henreitta Cea 11/29/2015, 3:38 PM   I spent a total of 15 Minutes at the the patient's bedside AND on the patient's hospital floor or unit, greater than 50% of which was counseling/coordinating care for LUQ fluid collection

## 2015-11-29 NOTE — Progress Notes (Signed)
Subjective: Sort of a rough day yesterday, pain on L side but ate better. Had several BMs. A little nausea this am. Did not like the chicken soup protein shake. Ate more solids.   Objective: Vital signs in last 24 hours: Temp:  [98.4 F (36.9 C)-99.2 F (37.3 C)] 98.8 F (37.1 C) (11/24 0645) Pulse Rate:  [84-90] 90 (11/24 0645) Resp:  [18-20] 20 (11/24 0645) BP: (115-131)/(58-65) 131/65 (11/24 0645) SpO2:  [95 %-96 %] 95 % (11/24 0645) Last BM Date: 11/28/15  Intake/Output from previous day: 11/23 0701 - 11/24 0700 In: 2927.5 [P.O.:1260; I.V.:1257.5; IV Piggyback:400] Out: 1142.5 [Urine:1100; Drains:42.5] Intake/Output this shift: No intake/output data recorded.  Alert, nontoxic, seems a little down today cta b/l Reg Soft, nd, mild ttp in LUQ; drain - sang with grayish hue No edema  Lab Results:   Recent Labs  11/28/15 0425 11/29/15 0433  WBC 23.6* 18.9*  HGB 10.1* 10.5*  HCT 29.8* 32.5*  PLT 563* 623*   BMET  Recent Labs  11/28/15 0425 11/29/15 0433  NA 130* 130*  K 4.2 3.8  CL 99* 99*  CO2 23 24  GLUCOSE 171* 155*  BUN 15 10  CREATININE 1.58* 1.08  CALCIUM 7.6* 7.9*   PT/INR  Recent Labs  11/26/15 1048  LABPROT 19.1*  INR 1.59   ABG No results for input(s): PHART, HCO3 in the last 72 hours.  Invalid input(s): PCO2, PO2  Studies/Results: No results found.  Anti-infectives: Anti-infectives    Start     Dose/Rate Route Frequency Ordered Stop   11/28/15 1100  vancomycin (VANCOCIN) IVPB 750 mg/150 ml premix     750 mg 150 mL/hr over 60 Minutes Intravenous Every 12 hours 11/28/15 1046     11/26/15 0830  piperacillin-tazobactam (ZOSYN) IVPB 3.375 g     3.375 g 12.5 mL/hr over 240 Minutes Intravenous Every 8 hours 11/26/15 0817     11/26/15 0645  vancomycin (VANCOCIN) IVPB 1000 mg/200 mL premix     1,000 mg 200 mL/hr over 60 Minutes Intravenous  Once 11/26/15 0637 11/26/15 0858   11/26/15 0645  piperacillin-tazobactam (ZOSYN) IVPB 3.375  g     3.375 g 100 mL/hr over 30 Minutes Intravenous  Once 11/26/15 3704 11/26/15 0825      Assessment/Plan: s/p * No surgery found *  infected peripancreatic fluid collection  S/p exploratory laparotomy, LOA, distal pancreatectomy 11/05/15 Dr. [name] S/p IR placement of percutaneous drain - 225 mL bloody grayish fluid; Culture pending - G+ cocci-->staph  Drain: 45cc/24h  Drain Amylase 13,000!  Will add octreotide given leak, I don't think PO is affecting leak - drain output not high volume.   WBC decreased to 18 today  Hyperkalemia - resolved HTN - will resume home meds s/p anticipated perc drain, PRN hydralazine  PMH arterial thromboembolism- Xarelto held; per vascular surgery does not necessarily need full therapeutic anticoagulation Severe protein calorie malnutrition - encouraged po, po getting better.  Doesn't appear oral intake is increasing drain output,  drain output stable around 40cc/day.  If still has leak will add octreotide when amylase results. Would like to hold on picc/tpn for now Renal - small bump in Cr, repeat in AM, back to normal FEN: reg diet;  ID: Zosyn, added vanc 11/23 given staph result. Will consult ID for abx rec/duration given suscept back now VTE: Lovenox 40 mg daily, SCD's  Cody Suarez. Cody Pulling, MD, FACS General, Bariatric, & Minimally Invasive Surgery [facility]   LOS: 2 days  Cody Suarez 11/29/2015

## 2015-11-29 NOTE — Progress Notes (Signed)
PT Cancellation Note  Patient Details Name: Cody Suarez MRN: [id number] DOB: 29-May-1945   Cancelled Treatment:    Reason Eval/Treat Not Completed: Patient declined, no reason specified (declines amb at this time) reports he will amb later with nursing staff; discussed with RN   [facility] 11/29/2015, 11:50 AM

## 2015-11-29 NOTE — Consult Note (Signed)
Date of Admission:  11/26/2015  Date of Consult:  11/29/2015  Reason for Consult: Staph aureus infected abdominal cavity Referring Physician: Dr. Redmond Pulling   HPI: Cody Suarez is an 70 y.o. male with history of ulcerative colitis, repair, cholecystectomy, arterial thromboembolism, PVD, HTN, recurrent colorectal cancer, and anemia of chronic disease. He underwent a laparoscopic right hemicolectomy in 2015 (Dr. Hassell Done) for colon cancer and, more recently on 11/05/15, exploratory laparotomy with enterolysis, excision of mesenteric metastasis, and distal pancreatectomy (Dr. Hassell Done) for recurrent adenocarcinoma of the colon. He had a drain in place postoperatively which ultimately was removed. He then presented with left upper quadrant left rib pain was sharp and constant and worse with inspiration. He also noticed some thick gray drainage the site of his prior surgical scar on Sunday, November 19. He had decreased appetite and nausea. CT scan was done the ER which showed a fluid collection 6.7 x 9.8 x 9.9 cm. Other labs were pertinent for white count of 22,000. He was started on broad-spectrum antibiotics and the fluid from the abdomen was sampled and sent for culture. Cultures in the abdomen now have yielded methicillin sensitive Staphylococcus aureus. I have narrowed him from vancomycin and Zosyn to Unasyn. He still has a fair amount of drainage into the abdomen that has abundant amount of amylase consistent with still activation of his pancreas  Past Medical History:  Diagnosis Date  . Anemia of chronic disease 2015   "related to cancer tx" (08/09/2013)  . Blood clot in vein 2015  . Colon cancer ([place]) 03/2013  . History of kidney stones    x 2passed them both  . Hypertension   . Peripheral vascular disease ([place])   . Ulcerative colitis ([place])    history    Past Surgical History:  Procedure Laterality Date  . ABDOMINAL AORTAGRAM N/A 08/09/2013   Procedure: ABDOMINAL Maxcine Ham;  Surgeon:  [name], MD;  Location: [place] CATH LAB;  Service: Cardiovascular;  Laterality: N/A;  . CHOLECYSTECTOMY N/A 09/08/2014   Procedure: LAPAROSCOPIC CHOLECYSTECTOMY WITH INTRAOPERATIVE CHOLANGIOGRAM;  Surgeon: Armandina Gemma, MD;  Location: [facility];  Service: General;  Laterality: N/A;  . COLON SURGERY    . FEMORAL-POPLITEAL BYPASS GRAFT Right 08/11/2013   Procedure:   RIGHT - POPLITEAL TO PERONEAL ARTERY BYPASS GRAFT  WITH NONREVERSED SAPHENOUS VEIN GRAFT,tHROMBECTOMY ANTERIOR TIBIALIS,ATTEMPTED THROMBECTOMY TIBIO-PERONEAL TRUNK AND POSTERIOR TIBIALIS, INTRAOPERATIVE ARTERIOGRAM.;  Surgeon: Mal Misty, MD;  Location: [place];  Service: Vascular;  Laterality: Right;  . FEMORAL-TIBIAL BYPASS GRAFT Right 11/17/2013   Procedure: BYPASS GRAFT RIGHT ABOVE KNEE POPLITEAL TO POSTERIOR TIBIAL ARTERY USING RIGHT NON-REVERSED CEPHALIC VEIN;  Surgeon: Mal Misty, MD;  Location: [place];  Service: Vascular;  Laterality: Right;  . HERNIA REPAIR  ~ 1995; 03/2013   UHR  . INTRAOPERATIVE ARTERIOGRAM Right 11/17/2013   Procedure: INTRA OPERATIVE ARTERIOGRAM;  Surgeon: Mal Misty, MD;  Location: [place];  Service: Vascular;  Laterality: Right;  . LAPAROSCOPIC RIGHT HEMI COLECTOMY N/A 03/28/2013   Procedure: LAPAROSCOPIC ASSISTED HEMI COLECTOMY;  Surgeon: Pedro Earls, MD;  Location: [facility];  Service: General;  Laterality: N/A;  . LAPAROSCOPY N/A 11/05/2015   Procedure: LAPAROSCOPY DIAGNOSTIC;  Surgeon: [name], MD;  Location: [facility];  Service: General;  Laterality: N/A;  . LAPAROTOMY N/A 11/05/2015   Procedure: EXPLORATORY LAPAROTOMY, resection of mass at tail of pancreas;  Surgeon: [name], MD;  Location: [facility];  Service: General;  Laterality: N/A;  .  LOWER EXTREMITY ANGIOGRAM Bilateral 08/09/2013  . TEE WITHOUT CARDIOVERSION N/A 08/10/2013   Procedure: TRANSESOPHAGEAL ECHOCARDIOGRAM (TEE);  Surgeon: Candee Furbish, MD;  [facility] END[facility]COPY;  Service: Cardiovascular;  Laterality: N/A;  .  TONSILLECTOMY  ~ 1950  . [place]; 03/2013  . VASECTOMY    . VEIN HARVEST Right 11/17/2013   Procedure: HARVEST OF RIGHT UPPER EXTREMITY CEPHALIC VEIN;  Surgeon: Mal Misty, MD;  Location: [place];  Service: Vascular;  Laterality: Right;    Social History:  reports that he has never smoked. He has never used smokeless tobacco. He reports that he drinks about 1.8 oz of alcohol per week . He reports that he does not use drugs.   Family History  Problem Relation Age of Onset  . Heart disease Mother   . Emphysema Father     Allergies  Allergen Reactions  . Amlodipine Swelling    Right LE edema  . Gabapentin Rash    Short-term memory decrease     Medications: I have reviewed patients current medications as documented in Epic Anti-infectives    Start     Dose/Rate Route Frequency Ordered Stop   11/29/15 1200  Ampicillin-Sulbactam (UNASYN) 3 g in sodium chloride 0.9 % 100 mL IVPB     3 g 200 mL/hr over 30 Minutes Intravenous Every 6 hours 11/29/15 0931     11/28/15 1100  vancomycin (VANCOCIN) IVPB 750 mg/150 ml premix  Status:  Discontinued     750 mg 150 mL/hr over 60 Minutes Intravenous Every 12 hours 11/28/15 1046 11/29/15 0906   11/26/15 0830  piperacillin-tazobactam (ZOSYN) IVPB 3.375 g  Status:  Discontinued     3.375 g 12.5 mL/hr over 240 Minutes Intravenous Every 8 hours 11/26/15 0817 11/29/15 0913   11/26/15 0645  vancomycin (VANCOCIN) IVPB 1000 mg/200 mL premix     1,000 mg 200 mL/hr over 60 Minutes Intravenous  Once 11/26/15 0637 11/26/15 0858   11/26/15 0645  piperacillin-tazobactam (ZOSYN) IVPB 3.375 g     3.375 g 100 mL/hr over 30 Minutes Intravenous  Once 11/26/15 0637 11/26/15 0825         ROS: as in HPI otherwise remainder of 12 point Review of Systems is negative    Blood pressure (!) 92/45, pulse 79, temperature 97.9 F (36.6 C), temperature source Oral, resp. rate 18, height _0  (1.778 m), weight 187 lb (84.8 kg), SpO2 90  %. General: Alert and awake, oriented x3, not in any acute distress. HEENT: anicteric sclera,  EOMI, oropharynx clear and without exudate Cardiovascular:regular rate, normal r,  Pulmonary: Fairly clear to auscultation bilaterally, no wheezing, rales or rhonchi Gastrointestinal: soft , slightly distended drain in place with murky fluid Musculoskeletal: no  clubbing or edema noted bilaterally Skin, soft tissue: no rashes Neuro: nonfocal, strength and sensation intact   Results for orders placed or performed during the hospital encounter of 11/26/15 (from the past 48 hour(s))  CBC     Status: Abnormal   Collection Time: 11/28/15  4:25 AM  Result Value Ref Range   WBC 23.6 (H) 4.0 - 10.5 K/uL   RBC 3.39 (L) 4.22 - 5.81 MIL/uL   Hemoglobin 10.1 (L) 13.0 - 17.0 g/dL   HCT 29.8 (L) 39.0 - 52.0 %   MCV 87.9 78.0 - 100.0 fL   MCH 29.8 26.0 - 34.0 pg   MCHC 33.9 30.0 - 36.0 g/dL   RDW 15.2 11.5 - 15.5 %   Platelets 563 (H) 150 -  400 K/uL  Basic metabolic panel     Status: Abnormal   Collection Time: 11/28/15  4:25 AM  Result Value Ref Range   Sodium 130 (L) 135 - 145 mmol/L   Potassium 4.2 3.5 - 5.1 mmol/L    Comment: DELTA CHECK NOTED REPEATED TO VERIFY NO VISIBLE HEMOLYSIS    Chloride 99 (L) 101 - 111 mmol/L   CO2 23 22 - 32 mmol/L   Glucose, Bld 171 (H) 65 - 99 mg/dL   BUN 15 6 - 20 mg/dL   Creatinine, Ser 1.58 (H) 0.61 - 1.24 mg/dL   Calcium 7.6 (L) 8.9 - 10.3 mg/dL   GFR calc non Af Amer 43 (L) >60 mL/min   GFR calc Af Amer 49 (L) >60 mL/min    Comment: (NOTE) The eGFR has been calculated using the CKD EPI equation. This calculation has not been validated in all clinical situations. eGFR's persistently <60 mL/min signify possible Chronic Kidney Disease.    Anion gap 8 5 - 15  Basic metabolic panel     Status: Abnormal   Collection Time: 11/29/15  4:33 AM  Result Value Ref Range   Sodium 130 (L) 135 - 145 mmol/L   Potassium 3.8 3.5 - 5.1 mmol/L   Chloride 99 (L) 101 -  111 mmol/L   CO2 24 22 - 32 mmol/L   Glucose, Bld 155 (H) 65 - 99 mg/dL   BUN 10 6 - 20 mg/dL   Creatinine, Ser 1.08 0.61 - 1.24 mg/dL   Calcium 7.9 (L) 8.9 - 10.3 mg/dL   GFR calc non Af Amer >60 >60 mL/min   GFR calc Af Amer >60 >60 mL/min    Comment: (NOTE) The eGFR has been calculated using the CKD EPI equation. This calculation has not been validated in all clinical situations. eGFR's persistently <60 mL/min signify possible Chronic Kidney Disease.    Anion gap 7 5 - 15  CBC     Status: Abnormal   Collection Time: 11/29/15  4:33 AM  Result Value Ref Range   WBC 18.9 (H) 4.0 - 10.5 K/uL   RBC 3.69 (L) 4.22 - 5.81 MIL/uL   Hemoglobin 10.5 (L) 13.0 - 17.0 g/dL   HCT 32.5 (L) 39.0 - 52.0 %   MCV 88.1 78.0 - 100.0 fL   MCH 28.5 26.0 - 34.0 pg   MCHC 32.3 30.0 - 36.0 g/dL   RDW 15.7 (H) 11.5 - 15.5 %   Platelets 623 (H) 150 - 400 K/uL   _0 (sdes,specrequest,cult,reptstatus)   ) Recent Results (from the past 720 hour(s))  MRSA PCR Screening     Status: None   Collection Time: 11/05/15 12:18 PM  Result Value Ref Range Status   MRSA by PCR NEGATIVE NEGATIVE Final    Comment:        The GeneXpert MRSA Assay (FDA approved for NASAL specimens only), is one component of a comprehensive MRSA colonization surveillance program. It is not intended to diagnose MRSA infection nor to guide or monitor treatment for MRSA infections.   Blood culture (routine x 2)     Status: None (Preliminary result)   Collection Time: 11/26/15  7:06 AM  Result Value Ref Range Status   Specimen Description BLOOD LEFT FOREARM  Final   Special Requests BOTTLES DRAWN AEROBIC AND ANAEROBIC 5CC  Final   Culture   Final    NO GROWTH 3 DAYS Performed at [facility]    Report Status PENDING  Incomplete  Blood culture (  routine x 2)     Status: None (Preliminary result)   Collection Time: 11/26/15  7:53 AM  Result Value Ref Range Status   Specimen Description BLOOD LEFT ANTECUBITAL   Final   Special Requests BOTTLES DRAWN AEROBIC AND ANAEROBIC 5CC EACH  Final   Culture   Final    NO GROWTH 3 DAYS Performed at [facility]    Report Status PENDING  Incomplete  Aerobic/Anaerobic Culture (surgical/deep wound)     Status: None (Preliminary result)   Collection Time: 11/26/15  2:15 PM  Result Value Ref Range Status   Specimen Description ABSCESS  Final   Special Requests PANCREATIC CYST  Final   Gram Stain   Final    ABUNDANT WBC PRESENT,BOTH PMN AND MONONUCLEAR ABUNDANT GRAM POSITIVE COCCI IN CLUSTERS IN PAIRS Performed at [facility]    Culture   Final    ABUNDANT STAPHYLOCOCCUS AUREUS NO ANAEROBES ISOLATED; CULTURE IN PROGRESS FOR 5 DAYS    Report Status PENDING  Incomplete   Organism ID, Bacteria STAPHYLOCOCCUS AUREUS  Final      Susceptibility   Staphylococcus aureus - MIC*    CIPROFLOXACIN >=8 RESISTANT Resistant     ERYTHROMYCIN 0.5 SENSITIVE Sensitive     GENTAMICIN <=0.5 SENSITIVE Sensitive     OXACILLIN 0.5 SENSITIVE Sensitive     TETRACYCLINE <=1 SENSITIVE Sensitive     VANCOMYCIN 1 SENSITIVE Sensitive     TRIMETH/SULFA <=10 SENSITIVE Sensitive     CLINDAMYCIN <=0.25 SENSITIVE Sensitive     RIFAMPIN <=0.5 SENSITIVE Sensitive     Inducible Clindamycin NEGATIVE Sensitive     * ABUNDANT STAPHYLOCOCCUS AUREUS     Impression/Recommendation  Principal Problem:   Intraabdominal abscess Active Problems:   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 male with Recurrent adenocarcinoma of the colon status post explored her laparotomy with resection of test disease including distal pancreatectomy 11/05/2015, now with infected her peripancreatic fluid collection.  #1 infected peripancreatic fluid collection with  methicillin sensitive staph aureus.: This organism which is a skin bacteria likely migrated into the abdominal cavity when there was a drain placed.  We can narrow to ampicillin sulbactam which will cover the methicillin sensitive staph aureus along with anaerobes and some other intra-abdominal pathogen's. Is more narrow and specific and we could convert him ultimately to Augmentin if he does well on it.  He does need to be treated until the abscess,/fluid collection has resolved.     11/29/2015, 7:52 PM   Thank you so much for this interesting consult  [place] for [place] [phone] (pager) [phone] (office) 11/29/2015, 7:52 PM  Rhina [place] 11/29/2015, 7:52 PM

## 2015-11-29 NOTE — Progress Notes (Signed)
Pharmacy Antibiotic Note  Cody Suarez is a 70 y.o. male admitted on 11/26/2015 with infected peripancreatic fluid collection following distal pancreatectomy 11/05/15 . Patient was started on empiric Zosyn and vancomycin.  Culture of fluid aspirate from 11/21 is now growing MSSA.  ID service is therefore de-escalating to Unasyn and Pharmacy has been consulted for dosing.  Plan: Unasyn 3 grams IV q6h (first dose 12 noon) Follow clinical course  Height: 5' 10"  (177.8 cm) Weight: 187 lb (84.8 kg) (t stated he wasn't sure but thats what he weighed a few week) IBW/kg (Calculated) : 73  Temp (24hrs), Avg:98.8 F (37.1 C), Min:98.4 F (36.9 C), Max:99.2 F (37.3 C)   Recent Labs Lab 11/26/15 0510 11/26/15 0518 11/27/15 0508 11/28/15 0425 11/29/15 0433  WBC 22.8*  --  25.7* 23.6* 18.9*  CREATININE 1.24 1.10 1.15 1.58* 1.08    Estimated Creatinine Clearance: 65.7 mL/min (by C-G formula based on SCr of 1.08 mg/dL).    Allergies  Allergen Reactions  . Amlodipine Swelling    Right LE edema  . Gabapentin Rash    Short-term memory decrease    Antimicrobials this admission: 11/21 Zosyn >> 11/24 11/21 >> vancomycin x 1 dose; resumed 11/23>>11/24 11/24 >> Unasyn  Dose adjustments this admission: n/a  Microbiology results: 11/21 Abscess (peripancreatic fluid collection): abundant MSSA 11/21 Blood x2: ngtd    Thank you for allowing pharmacy to be a part of this patient's care.  Clayburn Pert, PharmD, BCPS Pager: [phone] 11/29/2015  9:45 AM

## 2015-11-30 LAB — CBC
HEMATOCRIT: 29.8 % — AB (ref 39.0–52.0)
HEMOGLOBIN: 9.7 g/dL — AB (ref 13.0–17.0)
MCH: 28.5 pg (ref 26.0–34.0)
MCHC: 32.6 g/dL (ref 30.0–36.0)
MCV: 87.6 fL (ref 78.0–100.0)
Platelets: 574 10*3/uL — ABNORMAL HIGH (ref 150–400)
RBC: 3.4 MIL/uL — AB (ref 4.22–5.81)
RDW: 15.7 % — ABNORMAL HIGH (ref 11.5–15.5)
WBC: 13 10*3/uL — ABNORMAL HIGH (ref 4.0–10.5)

## 2015-11-30 MED ORDER — AMOXICILLIN-POT CLAVULANATE 875-125 MG PO TABS: 1.0000 | ORAL_TABLET | Freq: Two times a day (BID) | ORAL | 3 refills | Status: DC

## 2015-11-30 NOTE — Discharge Summary (Signed)
Physician Discharge Summary  Cody Suarez HWE:[id number] DOB: 1945/10/24 DOA: 11/26/2015  PCP: Annye Asa, MD  Admit date: 11/26/2015 Discharge date: 11/30/2015  Recommendations for Outpatient Follow-up:  1.   Follow-up Information    Pedro Earls, MD. Go on 12/06/2015.   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  2. Pancreas leak  Surgical Procedure:   Discharge Condition: Fair Disposition: Home  Diet recommendation: low fat  Filed Weights   11/27/15 2019  Weight: 84.8 kg (187 lb)    History of present illness: 70 y/o male with PMH significant for ulcerative colitis, umbilical hernia repair, cholecystectomy, arterial thromboembolism, PVD, HTN, recurrent colorectal cancer, and anemia of chronic disease. He underwent a laparoscopic right hemicolectomy in 2015 (Dr. Hassell Done) for colon cancer and, more recently on 11/05/15, exploratory laparotomy with enterolysis, excision of mesenteric metastasis, and distal pancreatectomy (Dr. Hassell Done) for recurrent adenocarcinoma of the colon. He is followed by oncologist Dr. Benay Spice and there is currently no evidence of further metastatic disease and the patient is not undergoing adjuvant chemotherapy. He presents today with LUQ/left rib pain that is sharp, constant, and worse with inspiration. The pain started about 48-72 hours ago but become more constant over the past 24 hours. Patient also notes  thick, gray drainage from the site of his previous surgical drain on Sunday 11/19, which has since subsided. Additional symptoms include decreased appetite and nausea, which have been present since his most recent operation. He endorses SOB secondary to pain. Patient denies fever, chills, chest pain, palpitations, vomiting, diarrhea, constipation, melena/hematochezia, or urinary symptoms.   Hospital Course:  He was admitted and started on IV Zosyn for the intra-abdominal fluid collection. He underwent percutaneous drainage by interventional radiology. Vancomycin was added to his antibiotic regimen. His oral anticoagulant was held. Cultures grew back MSSA and antibiotics were narrowed to Unasyn after an infectious disease consult. His white blood cell count started to trend downward. His oral intake gradually improved. Despite trying different protein and supplemental shakes we never found one that he liked. His drain amylase came back at 13,000 consistent with a pancreas leak. However output from the drain remained low after the initial placement and evacuation of 225 of grayish fluid. He is averaging 30-40 cc/day. He was given one or 2 doses of octreotide prior to discharge  He will be discharged home on Augmentin. He already has scheduled appointment with Dr. Hassell Done this coming Friday. I imagine he will need a follow-up CT prior to drain removal. He was instructed on drain care by nursing staff   Discharge Instructions  Discharge Instructions    Call MD for:    Complete by:  As directed    Temperature >101   Call MD for:  hives    Complete by:  As directed    Call MD for:  persistant dizziness or light-headedness    Complete by:  As directed    Call MD for:  persistant nausea and vomiting    Complete  by:  As directed    Call MD for:  redness, tenderness, or signs of infection (pain, swelling, redness, odor or green/yellow discharge around incision site)    Complete by:  As directed    Call  MD for:  severe uncontrolled pain    Complete by:  As directed    Diet general    Complete by:  As directed    Discharge instructions    Complete by:  As directed    See [facility] discharge instructions   Increase activity slowly    Complete by:  As directed        Medication List    STOP taking these medications   diphenhydramine-acetaminophen 25-500 MG Tabs tablet Commonly known as:  TYLENOL PM     TAKE these medications   amoxicillin-clavulanate 875-125 MG tablet Commonly known as:  AUGMENTIN Take 1 tablet by mouth 2 (two) times daily.   budesonide 3 MG 24 hr capsule Commonly known as:  ENTOCORT EC Take 6 mg by mouth daily with breakfast.   carvedilol 12.5 MG tablet Commonly known as:  COREG Take 6.25 mg by mouth 2 (two) times daily with a meal. What changed:  Another medication with the same name was removed. Continue taking this medication, and follow the directions you see here.   HYDROcodone-acetaminophen 5-325 MG tablet Commonly known as:  NORCO/VICODIN Take 1-2 tablets by mouth every 4 (four) hours as needed for moderate pain.   losartan 100 MG tablet Commonly known as:  COZAAR Take 1 tablet (100 mg total) by mouth daily. What changed:  when to take this   megestrol 400 MG/10ML suspension Commonly known as:  MEGACE Take 5 mLs (200 mg total) by mouth 2 (two) times daily.   multivitamin with minerals Tabs tablet Take 1 tablet by mouth daily.   ondansetron 4 MG disintegrating tablet Commonly known as:  ZOFRAN-ODT Take 4 mg by mouth every 8 (eight) hours as needed for nausea or vomiting.   spironolactone 25 MG tablet Commonly known as:  ALDACTONE TAKE 1 TABLET DAILY What changed:  See the new instructions.   VIAGRA 50 MG tablet Generic drug:  sildenafil Take 50 mg by mouth daily as needed for erectile dysfunction.   XARELTO 20 MG Tabs tablet Generic drug:  rivaroxaban TAKE 1 TABLET DAILY WITH BREAKFAST      Follow-up Information    Pedro Earls,  MD. Go on 12/06/2015.   Specialty:  General Surgery Why:  appt time 1:30 PM; pls arrive at 1:15 pm Contact information: [place] [id number] [phone]            The results of significant diagnostics from this hospitalization (including imaging, microbiology, ancillary and laboratory) are listed below for reference.    Significant Diagnostic Studies: Dg Chest 2 View  Result Date: 11/09/2015 CLINICAL DATA:  Rales. Hx colon cancer, htn, PVD, nonsmoker EXAM: CHEST  2 VIEW COMPARISON:  09/06/2014 and 09/26/2013 FINDINGS: Lung volumes are relatively low. Mild streaky opacity at the lung bases likely atelectasis. No convincing pneumonia. No pulmonary edema. No pleural effusion or pneumothorax. Cardiac silhouette is normal in size. No mediastinal or hilar masses or convincing adenopathy. Skeletal structures are intact. IMPRESSION: No active cardiopulmonary disease. Electronically Signed   By: Lajean Manes M.D.   On: 11/09/2015 08:32   Ct Angio Chest Pe W And/or Wo Contrast  Result Date: 11/26/2015 CLINICAL DATA:  Sudden onset left lower rib pain after surgery to remove a metastatic  mass in the tail of the pancreas on 11/05/2015. Metastatic colon cancer. Previous right hemicolectomy. History of hypertension, ulcerative colitis, umbilical hernia repair, cholecystectomy. EXAM: CT ANGIOGRAPHY CHEST CT ABDOMEN AND PELVIS WITH CONTRAST TECHNIQUE: Multidetector CT imaging of the chest was performed using the standard protocol during bolus administration of intravenous contrast. Multiplanar CT image reconstructions and MIPs were obtained to evaluate the vascular anatomy. Multidetector CT imaging of the abdomen and pelvis was performed using the standard protocol during bolus administration of intravenous contrast. CONTRAST:  100 mL Isovue 370 COMPARISON:  CT chest abdomen and pelvis 09/17/2015. PET-CT scan 10/10/2015. FINDINGS: CTA CHEST FINDINGS Cardiovascular: Satisfactory  opacification of the pulmonary arteries to the segmental level. No evidence of pulmonary embolism. Normal heart size. No pericardial effusion. Aortic atherosclerosis. No aortic aneurysm or dissection. No pericardial effusion. Mediastinum/Nodes: No enlarged mediastinal, hilar, or axillary lymph nodes. Thyroid gland, trachea, and esophagus demonstrate no significant findings. Small esophageal hiatal hernia. Lungs/Pleura: Small bilateral pleural effusions with basilar atelectasis, greater on the right. No pneumothorax. Airways are patent. Musculoskeletal: Degenerative changes in the spine. No destructive bone lesions. Review of the MIP images confirms the above findings. CT ABDOMEN and PELVIS FINDINGS Hepatobiliary: No focal liver abnormality is seen. Status post cholecystectomy. No biliary dilatation. Mild diffuse fatty infiltration of the liver. Pancreas: Postoperative changes with resection of the pancreatic tail. Normal enhancement of the remaining pancreatic tissue without ductal dilatation. There is a circumscribed fluid collection with thin enhancing wall arising in the left upper quadrant adjacent to the pancreatic tail at the margin of resection. The collection measures about 6.7 x 9.8 x 9.9 cm. There is surrounding infiltration in the left upper quadrant fat with a small amount of free fluid under the left hemidiaphragm. There appears to be a fistula extending from a fluid collection along the left flank down to the level of the the iliac crests, where it extends through the rectus abdominus muscle with small fluid collection and with scarring extending to the skin surface. This may represent a cutaneous fistula and is likely corresponding to the site of a previous surgical drain. This fluid collection could represent an abscess or a pseudo cyst relating to breakdown of the pancreatic margin of resection. Postoperative fluid collection felt less likely due to timing. Spleen: Normal in size without focal  abnormality. Adrenals/Urinary Tract: 2 stones in the lower pole right kidney, largest measuring 5.6 mm diameter. No hydronephrosis. Renal nephrograms are symmetrical. Bladder wall is not thickened. Stomach/Bowel: Stomach is within normal limits. Appendix appears normal. No evidence of bowel wall thickening, distention, or inflammatory changes. Postoperative partial colectomy with anastomosis demonstrated at the mid transverse region. Vascular/Lymphatic: Aortic atherosclerosis. No enlarged abdominal or pelvic lymph nodes. Reproductive: Prostate is unremarkable. Other: No free air in the abdomen. Postoperative changes along the midline consistent with recent surgery. Musculoskeletal: Degenerative changes in the spine. No destructive bone lesions. Review of the MIP images confirms the above findings. IMPRESSION: 1. No evidence of significant pulmonary embolus. 2. Bilateral pleural effusions with basilar atelectasis or consolidation, greater on the left. 3. Large fluid collection in the left upper quadrant with adjacent stranding and small subdiaphragmatic fluid. This likely represents either abscess or pseudocyst related to breakdown of pancreatic margin of resection. 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 4. Nonobstructing intrarenal stones in the right kidney. These results were called by telephone at the time of interpretation on 11/26/2015 at 6:47 am to Dr. Veatrice Kells ,  who verbally acknowledged these results. Electronically Signed   By: Lucienne Capers M.D.   On: 11/26/2015 06:48   Ct Abdomen Pelvis W Contrast  Result Date: 11/26/2015 CLINICAL DATA:  Sudden onset left lower rib pain after surgery to remove a metastatic mass in the tail of the pancreas on 11/05/2015. Metastatic colon cancer. Previous right hemicolectomy. History of hypertension, ulcerative colitis, umbilical hernia repair, cholecystectomy. EXAM: CT ANGIOGRAPHY CHEST CT  ABDOMEN AND PELVIS WITH CONTRAST TECHNIQUE: Multidetector CT imaging of the chest was performed using the standard protocol during bolus administration of intravenous contrast. Multiplanar CT image reconstructions and MIPs were obtained to evaluate the vascular anatomy. Multidetector CT imaging of the abdomen and pelvis was performed using the standard protocol during bolus administration of intravenous contrast. CONTRAST:  100 mL Isovue 370 COMPARISON:  CT chest abdomen and pelvis 09/17/2015. PET-CT scan 10/10/2015. FINDINGS: CTA CHEST FINDINGS Cardiovascular: Satisfactory opacification of the pulmonary arteries to the segmental level. No evidence of pulmonary embolism. Normal heart size. No pericardial effusion. Aortic atherosclerosis. No aortic aneurysm or dissection. No pericardial effusion. Mediastinum/Nodes: No enlarged mediastinal, hilar, or axillary lymph nodes. Thyroid gland, trachea, and esophagus demonstrate no significant findings. Small esophageal hiatal hernia. Lungs/Pleura: Small bilateral pleural effusions with basilar atelectasis, greater on the right. No pneumothorax. Airways are patent. Musculoskeletal: Degenerative changes in the spine. No destructive bone lesions. Review of the MIP images confirms the above findings. CT ABDOMEN and PELVIS FINDINGS Hepatobiliary: No focal liver abnormality is seen. Status post cholecystectomy. No biliary dilatation. Mild diffuse fatty infiltration of the liver. Pancreas: Postoperative changes with resection of the pancreatic tail. Normal enhancement of the remaining pancreatic tissue without ductal dilatation. There is a circumscribed fluid collection with thin enhancing wall arising in the left upper quadrant adjacent to the pancreatic tail at the margin of resection. The collection measures about 6.7 x 9.8 x 9.9 cm. There is surrounding infiltration in the left upper quadrant fat with a small amount of free fluid under the left hemidiaphragm. There appears to  be a fistula extending from a fluid collection along the left flank down to the level of the the iliac crests, where it extends through the rectus abdominus muscle with small fluid collection and with scarring extending to the skin surface. This may represent a cutaneous fistula and is likely corresponding to the site of a previous surgical drain. This fluid collection could represent an abscess or a pseudo cyst relating to breakdown of the pancreatic margin of resection. Postoperative fluid collection felt less likely due to timing. Spleen: Normal in size without focal abnormality. Adrenals/Urinary Tract: 2 stones in the lower pole right kidney, largest measuring 5.6 mm diameter. No hydronephrosis. Renal nephrograms are symmetrical. Bladder wall is not thickened. Stomach/Bowel: Stomach is within normal limits. Appendix appears normal. No evidence of bowel wall thickening, distention, or inflammatory changes. Postoperative partial colectomy with anastomosis demonstrated at the mid transverse region. Vascular/Lymphatic: Aortic atherosclerosis. No enlarged abdominal or pelvic lymph nodes. Reproductive: Prostate is unremarkable. Other: No free air in the abdomen. Postoperative changes along the midline consistent with recent surgery. Musculoskeletal: Degenerative changes in the spine. No destructive bone lesions. Review of the MIP images confirms the above findings. IMPRESSION: 1. No evidence of significant pulmonary embolus. 2. Bilateral pleural effusions with basilar atelectasis or consolidation, greater on the left. 3. Large fluid collection in the left upper quadrant with adjacent stranding and small subdiaphragmatic fluid. This likely represents either abscess or pseudocyst related to breakdown of pancreatic margin of resection.  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 4. Nonobstructing intrarenal stones in the right kidney. These results were  called by telephone at the time of interpretation on 11/26/2015 at 6:47 am to Dr. Veatrice Kells , who verbally acknowledged these results. Electronically Signed   By: Lucienne Capers M.D.   On: 11/26/2015 06:48   Ct Image Guided Drainage By Percutaneous Catheter  Result Date: 11/26/2015 INDICATION: 70 year old male with a history of distal pancreatectomy and development of abscess/pseudocyst. EXAM: CT GUIDED DRAINAGE OF PANCREATIC CYST ABSCESS MEDICATIONS: The patient is currently admitted to the hospital and receiving intravenous antibiotics. The antibiotics were administered within an appropriate time frame prior to the initiation of the procedure. 4 mg IV Zofran, 25 mg Demerol ANESTHESIA/SEDATION: 1.0 mg IV Versed 100 mcg IV Fentanyl Moderate Sedation Time:  21 The patient was continuously monitored during the procedure by the interventional radiology nurse under my direct supervision. COMPLICATIONS: None TECHNIQUE: Informed written consent was obtained from the patient after a thorough discussion of the procedural risks, benefits and alternatives. All questions were addressed. Maximal Sterile Barrier Technique was utilized including caps, mask, sterile gowns, sterile gloves, sterile drape, hand hygiene and skin antiseptic. A timeout was performed prior to the initiation of the procedure. PROCEDURE: The left upper quad was prepped with chlorhexidine in a sterile fashion, and a sterile drape was applied covering the operative field. A sterile gown and sterile gloves were used for the procedure. Local anesthesia was provided with 1% Lidocaine. Once the patient is prepped and draped sterilely, the skin and subcutaneous tissues were generously infiltrated 1% lidocaine for local anesthesia. Using CT guidance, an 18 gauge trocar needle was advanced into the pancreatic cyst. Using modified Seldinger technique a 12 French drainage catheter was placed into the cyst. Approximately 225 cc of gray age viscous fluid was  aspirated/evacuated. The catheter was sutured in position. Sample was sent to the lab for analysis. During the case the patient experienced referred pain to the left shoulder, and complained of nausea. He remained hemodynamically stable, and was treated medically. No significant blood loss.  No complications. FINDINGS: Scout CT demonstrates fluid collection similar in size to the comparison CT. Images during the case demonstrate placement of 12 French drainage catheter with decompression of the cyst/collection. IMPRESSION: Status post CT-guided drainage of pancreatic cyst with evacuation of approximately 225 cc of viscous gray fluid. Sample was sent to the lab for analysis. Signed, Dulcy Fanny. Earleen Newport, DO [facility] Electronically Signed   By: Corrie Mckusick D.O.   On: 11/26/2015 15:17    Microbiology: Recent Results (from the past 240 hour(s))  Blood culture (routine x 2)     Status: None (Preliminary result)   Collection Time: 11/26/15  7:06 AM  Result Value Ref Range Status   Specimen Description BLOOD LEFT FOREARM  Final   Special Requests BOTTLES DRAWN AEROBIC AND ANAEROBIC 5CC  Final   Culture   Final    NO GROWTH 3 DAYS Performed at [facility]    Report Status PENDING  Incomplete  Blood culture (routine x 2)     Status: None (Preliminary result)   Collection Time: 11/26/15  7:53 AM  Result Value Ref Range Status   Specimen Description BLOOD LEFT ANTECUBITAL  Final   Special Requests BOTTLES DRAWN AEROBIC AND ANAEROBIC 5CC EACH  Final   Culture   Final    NO GROWTH 3 DAYS Performed  at [facility]    Report Status PENDING  Incomplete  Aerobic/Anaerobic Culture (surgical/deep wound)     Status: None (Preliminary result)   Collection Time: 11/26/15  2:15 PM  Result Value Ref Range Status   Specimen Description ABSCESS  Final   Special Requests PANCREATIC CYST  Final   Gram Stain   Final    ABUNDANT WBC  PRESENT,BOTH PMN AND MONONUCLEAR ABUNDANT GRAM POSITIVE COCCI IN CLUSTERS IN PAIRS Performed at [facility]    Culture   Final    ABUNDANT STAPHYLOCOCCUS AUREUS NO ANAEROBES ISOLATED; CULTURE IN PROGRESS FOR 5 DAYS    Report Status PENDING  Incomplete   Organism ID, Bacteria STAPHYLOCOCCUS AUREUS  Final      Susceptibility   Staphylococcus aureus - MIC*    CIPROFLOXACIN >=8 RESISTANT Resistant     ERYTHROMYCIN 0.5 SENSITIVE Sensitive     GENTAMICIN <=0.5 SENSITIVE Sensitive     OXACILLIN 0.5 SENSITIVE Sensitive     TETRACYCLINE <=1 SENSITIVE Sensitive     VANCOMYCIN 1 SENSITIVE Sensitive     TRIMETH/SULFA <=10 SENSITIVE Sensitive     CLINDAMYCIN <=0.25 SENSITIVE Sensitive     RIFAMPIN <=0.5 SENSITIVE Sensitive     Inducible Clindamycin NEGATIVE Sensitive     * ABUNDANT STAPHYLOCOCCUS AUREUS     Labs: Basic Metabolic Panel:  Recent Labs Lab 11/26/15 0510 11/26/15 0518 11/27/15 0508 11/28/15 0425 11/29/15 0433  NA 134* 135 133* 130* 130*  K 4.2 4.3 5.2* 4.2 3.8  CL 100* 99* 101 99* 99*  CO2 25  --  24 23 24   GLUCOSE 139* 136* 208* 171* 155*  BUN 13 13 11 15 10   CREATININE 1.24 1.10 1.15 1.58* 1.08  CALCIUM 9.0  --  8.2* 7.6* 7.9*   Liver Function Tests:  Recent Labs Lab 11/26/15 0510  AST 26  ALT 21  ALKPHOS 68  BILITOT 0.4  PROT 8.0  ALBUMIN 3.1*    Recent Labs Lab 11/26/15 0510  LIPASE 27   No results for input(s): AMMONIA in the last 168 hours. CBC:  Recent Labs Lab 11/26/15 0510 11/26/15 0518 11/27/15 0508 11/28/15 0425 11/29/15 0433 11/30/15 0454  WBC 22.8*  --  25.7* 23.6* 18.9* 13.0*  NEUTROABS 18.4*  --   --   --   --   --   HGB 12.4* 13.6 11.5* 10.1* 10.5* 9.7*  HCT 36.7* 40.0 35.8* 29.8* 32.5* 29.8*  MCV 86.8  --  89.1 87.9 88.1 87.6  PLT 731*  --  750* 563* 623* 574*   Cardiac Enzymes: No results for input(s): CKTOTAL, CKMB, CKMBINDEX, TROPONINI in the last 168 hours. BNP: BNP (last 3 results) No results for  input(s): BNP in the last 8760 hours.  ProBNP (last 3 results) No results for input(s): PROBNP in the last 8760 hours.  CBG: No results for input(s): GLUCAP in the last 168 hours.  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

## 2015-11-30 NOTE — Progress Notes (Signed)
Pt to d/c home. Discussed and demonstrated how to empty and flush drain with patient and wife. Wife returned demonstration and both verbalized understanding. Sent home with supplies. AVS reviewed and "My Chart" discussed with pt. Pt capable of verbalizing medications, signs and symptoms of infection, drain patency and complications, and follow-up appointments. Remains hemodynamically stable. No signs and symptoms of distress. Educated pt to return to ER in the case of SOB, dizziness, or chest pain.

## 2015-12-01 ENCOUNTER — Other Ambulatory Visit: Payer: Self-pay | Admitting: General Surgery

## 2015-12-01 DIAGNOSIS — K651 Peritoneal abscess: Principal | ICD-10-CM

## 2015-12-01 DIAGNOSIS — IMO0001 Reserved for inherently not codable concepts without codable children: Secondary | ICD-10-CM

## 2015-12-01 DIAGNOSIS — T814XXD Infection following a procedure, subsequent encounter: Principal | ICD-10-CM

## 2015-12-01 LAB — CULTURE, BLOOD (ROUTINE X 2)
CULTURE: NO GROWTH
CULTURE: NO GROWTH

## 2015-12-01 LAB — AEROBIC/ANAEROBIC CULTURE (SURGICAL/DEEP WOUND)

## 2015-12-01 LAB — HCV COMMENT:

## 2015-12-01 LAB — HEPATITIS C ANTIBODY (REFLEX): HCV Ab: <0.1 s/co ratio (ref 0.0–0.9)

## 2015-12-01 LAB — HIV ANTIBODY (ROUTINE TESTING W REFLEX): HIV Screen 4th Generation wRfx: NONREACTIVE

## 2015-12-01 LAB — AEROBIC/ANAEROBIC CULTURE W GRAM STAIN (SURGICAL/DEEP WOUND)

## 2015-12-01 LAB — HEPATITIS B SURFACE ANTIGEN: HEP B S AG: NEGATIVE

## 2015-12-02 ENCOUNTER — Other Ambulatory Visit: Payer: Self-pay | Admitting: Surgery

## 2015-12-02 DIAGNOSIS — K651 Peritoneal abscess: Principal | ICD-10-CM

## 2015-12-02 DIAGNOSIS — IMO0001 Reserved for inherently not codable concepts without codable children: Secondary | ICD-10-CM

## 2015-12-02 DIAGNOSIS — T814XXD Infection following a procedure, subsequent encounter: Principal | ICD-10-CM

## 2015-12-10 ENCOUNTER — Telehealth: Payer: Self-pay

## 2015-12-10 NOTE — Telephone Encounter (Signed)
Pt called stating he received a letter from his insurance [employer] that as of Jan 06 2016 [facility] will no longer be in network. [employer] will be in network. Any labs that are sent out will have to go to [facility]. This information was passed to lab [place].

## 2015-12-11 ENCOUNTER — Other Ambulatory Visit: Payer: Self-pay | Admitting: Surgery

## 2015-12-11 ENCOUNTER — Ambulatory Visit
Admission: RE | Admit: 2015-12-11 | Discharge: 2015-12-11 | Disposition: A | Discharge status: Home / Self Care | Payer: 59 | Source: Ambulatory Visit | Attending: General Surgery | Admitting: General Surgery

## 2015-12-11 ENCOUNTER — Ambulatory Visit
Admission: RE | Admit: 2015-12-11 | Discharge: 2015-12-11 | Disposition: A | Discharge status: Home / Self Care | Payer: 59 | Source: Ambulatory Visit | Attending: Surgery | Admitting: Surgery

## 2015-12-11 DIAGNOSIS — T814XXD Infection following a procedure, subsequent encounter: Principal | ICD-10-CM

## 2015-12-11 DIAGNOSIS — K651 Peritoneal abscess: Principal | ICD-10-CM

## 2015-12-11 DIAGNOSIS — IMO0001 Reserved for inherently not codable concepts without codable children: Secondary | ICD-10-CM

## 2015-12-11 HISTORY — PX: IR GENERIC HISTORICAL: IMG1180011

## 2015-12-11 MED ORDER — IOPAMIDOL (ISOVUE-300) INJECTION 61%
100.0000 mL | Freq: Once | INTRAVENOUS | Status: AC | PRN
  Administered 2015-12-11: 100 mL via INTRAVENOUS

## 2015-12-24 ENCOUNTER — Ambulatory Visit
Admission: RE | Admit: 2015-12-24 | Discharge: 2015-12-24 | Disposition: A | Discharge status: Home / Self Care | Payer: 59 | Source: Ambulatory Visit | Attending: Surgery | Admitting: Surgery

## 2015-12-24 ENCOUNTER — Other Ambulatory Visit: Payer: Self-pay | Admitting: Surgery

## 2015-12-24 DIAGNOSIS — T814XXD Infection following a procedure, subsequent encounter: Principal | ICD-10-CM

## 2015-12-24 DIAGNOSIS — IMO0001 Reserved for inherently not codable concepts without codable children: Secondary | ICD-10-CM

## 2015-12-24 DIAGNOSIS — K651 Peritoneal abscess: Principal | ICD-10-CM

## 2015-12-24 HISTORY — PX: IR GENERIC HISTORICAL: IMG1180011

## 2015-12-24 MED ORDER — IOPAMIDOL (ISOVUE-300) INJECTION 61%: 100.0000 mL | Freq: Once | INTRAVENOUS | Status: DC | PRN

## 2015-12-24 NOTE — Progress Notes (Signed)
Patient ID: Cody Suarez, male   DOB: 19-Nov-1945, 70 y.o.   MRN: [id number]   Referring Physician(s): Martin,Matthew  Chief Complaint: The patient is seen in follow up today s/p drain placement for pancreatic fluid collection on 11-26-15.  History of present illness:  This is a 70 yo male who underwent a right hemicolectomy in 2015.  He developed scar tissue and mesenteric metastasis and subsequently underwent an exploratory laparotomy with enterolysis, excision of mesenteric metastasis, and distal pancreatectomy on 11-05-15 by Dr. Hassell Done.  He presented to the ED on 11/21 with LUQ pain and was found to have a complex fluid collection adjacent to the pancreas, possibly c/w pancreatic leak.  IR placed a drain that day as well.  He continues to have this drain with malodorous output.  It is draining tan, milky output, anywhere from 10-40cc day.  They are flushing it with 5cc of NS BID.    Past Medical History:  Diagnosis Date  . Anemia of chronic disease 2015   "related to cancer tx" (08/09/2013)  . Blood clot in vein 2015  . Colon cancer ([place]) 03/2013  . History of kidney stones    x 2passed them both  . Hypertension   . Peripheral vascular disease ([place])   . Ulcerative colitis ([place])    history    Past Surgical History:  Procedure Laterality Date  . ABDOMINAL AORTAGRAM N/A 08/09/2013   Procedure: ABDOMINAL Maxcine Ham;  Surgeon: [name], MD;  Location: [facility] CATH [facility];  Service: Cardiovascular;  Laterality: N/A;  . CHOLECYSTECTOMY N/A 09/08/2014   Procedure: LAPAROSCOPIC CHOLECYSTECTOMY WITH INTRAOPERATIVE CHOLANGIOGRAM;  Surgeon: Armandina Gemma, MD;  [facility];  Service: General;  Laterality: N/A;  . COLON SURGERY    . FEMORAL-POPLITEAL BYPASS GRAFT Right 08/11/2013   Procedure:   RIGHT - POPLITEAL TO PERONEAL ARTERY BYPASS GRAFT  WITH NONREVERSED SAPHENOUS VEIN GRAFT,tHROMBECTOMY ANTERIOR TIBIALIS,ATTEMPTED THROMBECTOMY TIBIO-PERONEAL TRUNK AND POSTERIOR TIBIALIS, INTRAOPERATIVE  ARTERIOGRAM.;  Surgeon: Mal Misty, MD;  Location: [facility];  Service: Vascular;  Laterality: Right;  . FEMORAL-TIBIAL BYPASS GRAFT Right 11/17/2013   Procedure: BYPASS GRAFT RIGHT ABOVE KNEE POPLITEAL TO POSTERIOR TIBIAL ARTERY USING RIGHT NON-REVERSED CEPHALIC VEIN;  Surgeon: Mal Misty, MD;  Location: [facility];  Service: Vascular;  Laterality: Right;  . HERNIA REPAIR  ~ 1995; 03/2013   UHR  . INTRAOPERATIVE ARTERIOGRAM Right 11/17/2013   Procedure: INTRA OPERATIVE ARTERIOGRAM;  Surgeon: Mal Misty, MD;  Location: [facility];  Service: Vascular;  Laterality: Right;  . LAPAROSCOPIC RIGHT HEMI COLECTOMY N/A 03/28/2013   Procedure: LAPAROSCOPIC ASSISTED HEMI COLECTOMY;  Surgeon: Pedro Earls, MD;  [facility];  Service: General;  Laterality: N/A;  . LAPAROSCOPY N/A 11/05/2015   Procedure: LAPAROSCOPY DIAGNOSTIC;  Surgeon: [name], MD;  [facility];  Service: General;  Laterality: N/A;  . LAPAROTOMY N/A 11/05/2015   Procedure: EXPLORATORY LAPAROTOMY, resection of mass at tail of pancreas;  Surgeon: [name], MD;  [facility];  Service: General;  Laterality: N/A;  . LOWER EXTREMITY ANGIOGRAM Bilateral 08/09/2013  . TEE WITHOUT CARDIOVERSION N/A 08/10/2013   Procedure: TRANSESOPHAGEAL ECHOCARDIOGRAM (TEE);  Surgeon: Candee Furbish, MD;  Location: [facility] ENDOSCOPY;  Service: Cardiovascular;  Laterality: N/A;  . TONSILLECTOMY  ~ 1950  . [place]; 03/2013  . VASECTOMY    . VEIN HARVEST Right 11/17/2013   Procedure: HARVEST OF RIGHT UPPER EXTREMITY CEPHALIC VEIN;  Surgeon: Mal Misty, MD;  Location: [facility];  Service:  Vascular;  Laterality: Right;    Allergies: Amlodipine and Gabapentin  Medications: Prior to Admission medications   Medication Sig Start Date End Date Taking? Authorizing Provider  amoxicillin-clavulanate (AUGMENTIN) 875-125 MG tablet Take 1 tablet by mouth 2 (two) times daily. 11/30/15   [name], MD  budesonide (ENTOCORT EC) 3 MG 24  hr capsule Take 6 mg by mouth daily with breakfast.     Historical Provider, MD  carvedilol (COREG) 12.5 MG tablet Take 6.25 mg by mouth 2 (two) times daily with a meal.    Historical Provider, MD  HYDROcodone-acetaminophen (NORCO/VICODIN) 5-325 MG tablet Take 1-2 tablets by mouth every 4 (four) hours as needed for moderate pain. 11/11/15   [name], MD  losartan (COZAAR) 100 MG tablet Take 1 tablet (100 mg total) by mouth daily. Patient taking differently: Take 100 mg by mouth daily with breakfast.  02/06/15   [name], MD  megestrol (MEGACE) 400 MG/10ML suspension Take 5 mLs (200 mg total) by mouth 2 (two) times daily. 11/22/15   [name], MD  Multiple Vitamin (MULTIVITAMIN WITH MINERALS) TABS tablet Take 1 tablet by mouth daily.    Historical Provider, MD  ondansetron (ZOFRAN-ODT) 4 MG disintegrating tablet Take 4 mg by mouth every 8 (eight) hours as needed for nausea or vomiting.  11/15/15   Historical Provider, MD  spironolactone (ALDACTONE) 25 MG tablet TAKE 1 TABLET DAILY Patient taking differently: TAKE 1 TABLET by mouth in the morning 08/12/15   [name], MD  VIAGRA 50 MG tablet Take 50 mg by mouth daily as needed for erectile dysfunction.  08/16/15   Historical Provider, MD  XARELTO 20 MG TABS tablet TAKE 1 TABLET DAILY WITH BREAKFAST 08/15/15   [name], MD     Family History  Problem Relation Age of Onset  . Heart disease Mother   . Emphysema Father     Social History   Social History  . Marital status: Married    Spouse name: N/A  . Number of children: N/A  . Years of education: N/A   Social History Main Topics  . Smoking status: Never Smoker  . Smokeless tobacco: Never Used     Comment: states his father died of emphysema from smoking, he did not want to be like that  . Alcohol use 1.8 oz/week    3 Glasses of wine per week     Comment: occasionally  . Drug use: No  . Sexual activity: Yes   Other Topics Concern  . Not on file    Social History Narrative  . No narrative on file     Vital Signs: There were no vitals taken for this visit.  Physical Exam  Gen: NAD Abd: LUQ drain in place with malodorous tan, milky drainage in bag, but minimal.  Drain site is c/d/i. Well-healed midline scar.  Otherwise NT, ND  Imaging: No results found.  Labs:  CBC:  Recent Labs  11/27/15 0508 11/28/15 0425 11/29/15 0433 11/30/15 0454  WBC 25.7* 23.6* 18.9* 13.0*  HGB 11.5* 10.1* 10.5* 9.7*  HCT 35.8* 29.8* 32.5* 29.8*  PLT 750* 563* 623* 574*    COAGS:  Recent Labs  10/04/15 0720 11/05/15 0523 11/26/15 1048  INR 1.00 1.04 1.59    BMP:  Recent Labs  11/26/15 0510 11/26/15 0518 11/27/15 0508 11/28/15 0425 11/29/15 0433  NA 134* 135 133* 130* 130*  K 4.2 4.3 5.2* 4.2 3.8  CL 100* 99* 101 99* 99*  CO2  25  --  24 23 24   GLUCOSE 139* 136* 208* 171* 155*  BUN 13 13 11 15 10   CALCIUM 9.0  --  8.2* 7.6* 7.9*  CREATININE 1.24 1.10 1.15 1.58* 1.08  GFRNONAA 57*  --  >60 43* >60  GFRAA >60  --  >60 49* >60    LIVER FUNCTION TESTS:  Recent Labs  10/09/15 1434 11/06/15 0323 11/26/15 0510  BILITOT 0.4 0.8 0.4  AST 18 34 26  ALT 19 26 21   ALKPHOS 42 38 68  PROT 6.8 6.4* 8.0  ALBUMIN 3.9 3.5 3.1*    Assessment:  1. Pancreatic leak, s/p distal pancreatectomy on 11-05-15, with drain placement on 11-26-15  CT scan today still shows some fluid around this drain.  He is still putting out about 10-40cc a day of output.  We have advised he and his wife to cease drain flushes and to return in 4 weeks for a follow up CT abd/pel with IV contrast only to follow up on this leak and for evaluation in the drain clinic.  He is unsure if he needs to follow up with Dr. Hassell Done.  I have advised him to call Dr. Earlie Server nurse so she can ask him and get back to the patient.    Signed: Henreitta Cea 12/24/2015, 2:21 PM   Please refer to Dr. Katrinka Blazing attestation of this note for management and plan.

## 2016-01-01 ENCOUNTER — Ambulatory Visit: Payer: 59 | Admitting: Oncology

## 2016-01-01 ENCOUNTER — Telehealth: Payer: Self-pay | Admitting: Oncology

## 2016-01-01 NOTE — Telephone Encounter (Signed)
sw pt to confirm 1/10 appt date/time per LOS

## 2016-01-02 ENCOUNTER — Other Ambulatory Visit: Payer: Self-pay | Admitting: Cardiovascular Disease

## 2016-01-02 NOTE — Telephone Encounter (Signed)
Please review for refill. Thanks!  

## 2016-01-02 NOTE — Telephone Encounter (Signed)
Rx has been sent to the pharmacy electronically. ° °

## 2016-01-10 ENCOUNTER — Encounter: Payer: Self-pay | Admitting: Interventional Radiology

## 2016-01-15 ENCOUNTER — Other Ambulatory Visit: Payer: Self-pay | Admitting: *Deleted

## 2016-01-15 ENCOUNTER — Telehealth: Payer: Self-pay | Admitting: Oncology

## 2016-01-15 ENCOUNTER — Ambulatory Visit (HOSPITAL_BASED_OUTPATIENT_CLINIC_OR_DEPARTMENT_OTHER): Payer: 59 | Admitting: Oncology

## 2016-01-15 VITALS — BP 115/65 | HR 99 | Temp 98.2°F | Resp 17 | Wt 159.6 lb

## 2016-01-15 DIAGNOSIS — D509 Iron deficiency anemia, unspecified: Secondary | ICD-10-CM

## 2016-01-15 DIAGNOSIS — R911 Solitary pulmonary nodule: Secondary | ICD-10-CM

## 2016-01-15 DIAGNOSIS — C786 Secondary malignant neoplasm of retroperitoneum and peritoneum: Secondary | ICD-10-CM | POA: Diagnosis not present

## 2016-01-15 DIAGNOSIS — C184 Malignant neoplasm of transverse colon: Secondary | ICD-10-CM

## 2016-01-15 DIAGNOSIS — R110 Nausea: Secondary | ICD-10-CM

## 2016-01-15 DIAGNOSIS — I10 Essential (primary) hypertension: Secondary | ICD-10-CM

## 2016-01-15 DIAGNOSIS — R634 Abnormal weight loss: Secondary | ICD-10-CM

## 2016-01-15 DIAGNOSIS — R630 Anorexia: Secondary | ICD-10-CM

## 2016-01-15 MED ORDER — PROMETHAZINE HCL 12.5 MG PO TABS: ORAL_TABLET | ORAL | 0 refills | Status: DC

## 2016-01-15 NOTE — Progress Notes (Signed)
[facility] OFFICE PROGRESS NOTE   Diagnosis: Colon cancer  INTERVAL HISTORY:   Cody Suarez returns as scheduled. He was hospitalized in November with an intra-abdominal abscess. A drain remains in place and he continues Augmentin. He has anorexia and intermittent nausea. He never started the Megace. Zofran does not help the nausea. He denies diarrhea. He has altered taste. No pain. He is scheduled for a repeat abdomen CT and follow-up with interventional radiology next week. The drain output has diminished over the past week.  Objective:  Vital signs in last 24 hours:  Blood pressure 115/65, pulse 99, temperature 98.2 F (36.8 C), temperature source Oral, resp. rate 17, weight 159 lb 9.6 oz (72.4 kg), SpO2 99 %.    HEENT: No thrush Resp: Lungs clear bilaterally Cardio: Regular rate and rhythm GI: No hepatomegaly, left upper quadrant drain site with a gauze dressing, no mass, no apparent ascites, light yellow discharge from the drainage catheter Vascular: No leg edema   Lab Results:  Lab Results  Component Value Date   WBC 13.0 (H) 11/30/2015   HGB 9.7 (L) 11/30/2015   HCT 29.8 (L) 11/30/2015   MCV 87.6 11/30/2015   PLT 574 (H) 11/30/2015   NEUTROABS 18.4 (H) 11/26/2015    Lab Results  Component Value Date   NA 130 (L) 11/29/2015    Lab Results  Component Value Date   CEA1 2.7 09/17/2015    Imaging:  No results found.  Medications: I have reviewed the patient's current medications.  Assessment/Plan: 1. 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 Negative for a BRAF mutation  Tumor invaded through the muscularis propria into pericolonic fatty tissue and involved the attached omentum.   Cycle 1 adjuvant Xeloda 05/28/2013.   Cycle 2 adjuvant Xeloda 06/18/2013.    Cycle 3 adjuvant Xeloda 07/09/2013.   Cycle 4 adjuvant Xeloda 07/30/2013.   Cycle 5 adjuvant Xeloda 08/20/2013.  Cycle 6 adjuvant Xeloda 09/11/2013.  cycle 7 adjuvant Xeloda 10/02/2013  Cycle 8 adjuvant Xeloda 10/23/2013  CTs of the chest, abdomen, and pelvis on 07/18/2014-negative for recurrent colon cancer   CTs abdomen/pelvis 09/06/2014 showed acute cholecystitis.  Colonoscopy 08/02/2015-chronic colitis, no malignancy  CTs 09/17/2015, new mass near the tail the pancreas, stable lung nodules  Biopsy mesenteric mass 10/04/2015 with metastatic adenocarcinoma consistent with colorectal primary  PET scan 10/10/2015 with soft tissue thickening within the peritoneal space of the left upper quadrant with SUV Max 7.9; no additional sites of peritoneal nodularity to suggest metastasis; hypermetabolic ill-defined nodule in the left lower lobe favored inflammatory.  Resection of the left abdominal mesenteric mass/tail the pancreas on 11/05/2015 with the pathology confirming metastatic colon cancer, positive "serosal "margin 2. Ulcerative colitis-extensive chronic active ulcerative colitis was noted on the colon resection specimen 03/28/2013.  Followed by Dr.Magod 3. Hypertension.  4. History of microcytic anemia-likely iron deficiency, unable to tolerate oral iron. Improved. 5. Possible area of cecal wall thickening noted on abdominal CT 03/20/2013. 6. Family history of colon cancer (maternal grandfather died in his 25s with colon cancer). 7. Bilateral calf and low anterior leg pain. Bilateral lower extremity venous duplex negative for DVT 07/14/2013. Right ABI with moderate, borderline severe arterial insufficiency; left ABI suggestive of moderate arterial insufficiency. He was referred to vascular. 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 TEE showed thrombus in the descending aorta. He underwent right  popliteal to peroneal artery bypass graft, thrombectomy  anterior tibialis and attempted thrombectomy tibioperoneal trunk and posterior tibialis on 08/11/2013. Right calf pain 09/26/2013 with findings of occlusion of right popliteal to peroneal bypass status post thrombolysis. Now on anticoagulation with Xarelto.  recurrent pain and ulceration at the right foot, status post a popliteal to posterior tibial artery bypass using a cephalic vein graft on 83/43/7357 8. Status post laparoscopic cholecystectomy 09/08/2014 9. Anorexia following the mesenteric mass/pancreas tail resection 10. Abdominal abscess November 2017-pancreas "cysts "drainage catheter placed 11/26/2015   Disposition:  [name] developed a postoperative pancreatic leak following resection of the mesenteric mass. A drain remains in place and he continues Augmentin. He is scheduled for repeat imaging next week.  He has anorexia and a 30 pound weight loss over the past several months. He complains of intermittent nausea. He did not begin Megace that was prescribed when I saw him in November. He will begin a trial of Phenergan. He will contact me within the next few days if the Phenergan does not help the nausea. We will consider low-dose prednisone or Reglan.  He will return for an office visit here in one month. I am available to see him sooner as needed. He will discuss the indication for continuing antibiotics with Dr. Hassell Done. It is possible the Augmentin is contributing to his nausea.  25 minutes were spent with the patient today. The majority of the time was used for counseling and coordination of care.  Betsy Coder, MD  01/15/2016  12:06 PM

## 2016-01-15 NOTE — Telephone Encounter (Signed)
Appointments scheduled per 1/10 LOS. Patient given AVS report and calendars with future scheduled appointments. °

## 2016-01-17 ENCOUNTER — Telehealth: Payer: Self-pay | Admitting: *Deleted

## 2016-01-17 MED ORDER — METOCLOPRAMIDE HCL 5 MG PO TABS: 5.0000 mg | ORAL_TABLET | Freq: Three times a day (TID) | ORAL | 0 refills | Status: DC

## 2016-01-17 NOTE — Telephone Encounter (Signed)
Message from pt reporting phenergan is "entirely too strong, took half tablet and it knocked me out." Requests alternative antiemetic to be sent to pharmacy. Reviewed with Dr. Benay Spice: Try Reglan 5 mg TID AC. Do not take with Reglan. MD discussed case with Dr. Hassell Done. Dr. Hassell Done says pt can stop antibiotic.  Returned call to pt with Reglan instructions, he reports Dr. Earlie Server office called him with instructions to stop antibiotic. He understands to discontinue Phenergan while taking Reglan.

## 2016-01-21 ENCOUNTER — Encounter: Payer: Self-pay | Admitting: Radiology

## 2016-01-21 ENCOUNTER — Other Ambulatory Visit: Payer: Self-pay | Admitting: Surgery

## 2016-01-21 ENCOUNTER — Ambulatory Visit
Admission: RE | Admit: 2016-01-21 | Discharge: 2016-01-21 | Disposition: A | Discharge status: Home / Self Care | Payer: 59 | Source: Ambulatory Visit | Attending: Surgery | Admitting: Surgery

## 2016-01-21 ENCOUNTER — Telehealth: Payer: Self-pay | Admitting: Vascular Surgery

## 2016-01-21 DIAGNOSIS — T814XXD Infection following a procedure, subsequent encounter: Principal | ICD-10-CM

## 2016-01-21 DIAGNOSIS — K651 Peritoneal abscess: Principal | ICD-10-CM

## 2016-01-21 DIAGNOSIS — IMO0001 Reserved for inherently not codable concepts without codable children: Secondary | ICD-10-CM

## 2016-01-21 HISTORY — PX: IR GENERIC HISTORICAL: IMG1180011

## 2016-01-21 MED ORDER — IOPAMIDOL (ISOVUE-300) INJECTION 61%
75.0000 mL | Freq: Once | INTRAVENOUS | Status: AC | PRN
  Administered 2016-01-21: 75 mL via INTRAVENOUS

## 2016-01-21 MED FILL — MONOJECT 0.9% NA CL SYRING: 0.9 | 30 days supply | Qty: 150 | Fill #0

## 2016-01-21 NOTE — Progress Notes (Signed)
Referring Physician(s): Martin,Matthew  Chief Complaint: The patient is seen in follow up today s/p  11/26/2015:Status post CT-guided drainage of pancreatic cyst  History of present illness:  This is a 71 yo male who underwent a right hemicolectomy in 2015.  He developed scar tissue and mesenteric metastasis and subsequently underwent an exploratory laparotomy with enterolysis, excision of mesenteric metastasis, and distal pancreatectomy on 11-05-15 by Dr. Hassell Done.  He presented to the ED on 11/21 with LUQ pain and was found to have a complex fluid collection adjacent to the pancreas, possibly c/w pancreatic leak.  IR placed a drain that day as well.   Was seen 12/19 in IR clinic for follow up and CT Improving collection but still draining 20-40 cc daily. Plan was to stop flushing and return in 2 weeks for additional  follow up.  CT today planned.  Pt has stopped flushing drain No output for days now. Afeb; feeling somewhat better.   Past Medical History:  Diagnosis Date  . Anemia of chronic disease 2015   "related to cancer tx" (08/09/2013)  . Blood clot in vein 2015  . Colon cancer ([place]) 03/2013  . History of kidney stones    x 2passed them both  . Hypertension   . Peripheral vascular disease ([place])   . Ulcerative colitis ([place])    history    Past Surgical History:  Procedure Laterality Date  . ABDOMINAL AORTAGRAM N/A 08/09/2013   Procedure: ABDOMINAL Maxcine Ham;  Surgeon: [name], MD;  Location: [facility] CATH LAB;  Service: Cardiovascular;  Laterality: N/A;  . CHOLECYSTECTOMY N/A 09/08/2014   Procedure: LAPAROSCOPIC CHOLECYSTECTOMY WITH INTRAOPERATIVE CHOLANGIOGRAM;  Surgeon: Armandina Gemma, MD;  [facility];  Service: General;  Laterality: N/A;  . COLON SURGERY    . FEMORAL-POPLITEAL BYPASS GRAFT Right 08/11/2013   Procedure:   RIGHT - POPLITEAL TO PERONEAL ARTERY BYPASS GRAFT  WITH NONREVERSED SAPHENOUS VEIN GRAFT,tHROMBECTOMY ANTERIOR TIBIALIS,ATTEMPTED THROMBECTOMY  TIBIO-PERONEAL TRUNK AND POSTERIOR TIBIALIS, INTRAOPERATIVE ARTERIOGRAM.;  Surgeon: Mal Misty, MD;  Location: [facility];  Service: Vascular;  Laterality: Right;  . FEMORAL-TIBIAL BYPASS GRAFT Right 11/17/2013   Procedure: BYPASS GRAFT RIGHT ABOVE KNEE POPLITEAL TO POSTERIOR TIBIAL ARTERY USING RIGHT NON-REVERSED CEPHALIC VEIN;  Surgeon: Mal Misty, MD;  Location: [facility];  Service: Vascular;  Laterality: Right;  . HERNIA REPAIR  ~ 1995; 03/2013   UHR  . INTRAOPERATIVE ARTERIOGRAM Right 11/17/2013   Procedure: INTRA OPERATIVE ARTERIOGRAM;  Surgeon: Mal Misty, MD;  Location: [facility] OR;  Service: Vascular;  Laterality: Right;  . IR GENERIC HISTORICAL  12/11/2015   IR RADIOLOGIST EVAL & MGMT 12/11/2015 Arne Cleveland, MD [facility] INTERV RAD  . IR GENERIC HISTORICAL  12/24/2015   IR RADIOLOGIST EVAL & MGMT 12/24/2015 Jacqulynn Cadet, MD [facility] INTERV RAD  . LAPAROSCOPIC RIGHT HEMI COLECTOMY N/A 03/28/2013   Procedure: LAPAROSCOPIC ASSISTED HEMI COLECTOMY;  Surgeon: Pedro Earls, MD;  [facility];  Service: General;  Laterality: N/A;  . LAPAROSCOPY N/A 11/05/2015   Procedure: LAPAROSCOPY DIAGNOSTIC;  Surgeon: [name], MD;  [facility];  Service: General;  Laterality: N/A;  . LAPAROTOMY N/A 11/05/2015   Procedure: EXPLORATORY LAPAROTOMY, resection of mass at tail of pancreas;  Surgeon: [name], MD;  [facility];  Service: General;  Laterality: N/A;  . LOWER EXTREMITY ANGIOGRAM Bilateral 08/09/2013  . TEE WITHOUT CARDIOVERSION N/A 08/10/2013   Procedure: TRANSESOPHAGEAL ECHOCARDIOGRAM (TEE);  Surgeon: Candee Furbish, MD;  Location: [facility];  Service: Cardiovascular;  Laterality:  N/A;  . TONSILLECTOMY  ~ 1950  . [place]; 03/2013  . VASECTOMY    . VEIN HARVEST Right 11/17/2013   Procedure: HARVEST OF RIGHT UPPER EXTREMITY CEPHALIC VEIN;  Surgeon: Mal Misty, MD;  Location: [facility];  Service: Vascular;  Laterality: Right;    Allergies: Amlodipine  and Gabapentin  Medications: Prior to Admission medications   Medication Sig Start Date End Date Taking? Authorizing Provider  budesonide (ENTOCORT EC) 3 MG 24 hr capsule Take 6 mg by mouth daily with breakfast.    Yes Historical Provider, MD  losartan (COZAAR) 100 MG tablet Take 1 tablet (100 mg total) by mouth daily. Patient taking differently: Take 100 mg by mouth daily with breakfast. Take 1/2 tablet daily. 02/06/15  Yes [name], MD  metoCLOPramide (REGLAN) 5 MG tablet Take 1 tablet (5 mg total) by mouth 3 (three) times daily before meals. 01/17/16  Yes [name], MD  Multiple Vitamin (MULTIVITAMIN WITH MINERALS) TABS tablet Take 1 tablet by mouth daily.   Yes Historical Provider, MD  spironolactone (ALDACTONE) 25 MG tablet TAKE 1 TABLET DAILY Patient taking differently: TAKE 1 TABLET by mouth in the morning 08/12/15  Yes [name], MD  XARELTO 20 MG TABS tablet TAKE 1 TABLET DAILY WITH BREAKFAST 01/02/16  Yes [name], MD  zolpidem (AMBIEN) 5 MG tablet Take 5 mg by mouth at bedtime as needed for sleep.   Yes Historical Provider, MD  amoxicillin-clavulanate (AUGMENTIN) 875-125 MG tablet Take 1 tablet by mouth 2 (two) times daily. Patient not taking: Reported on 01/21/2016 11/30/15   [name], MD  carvedilol (COREG) 12.5 MG tablet Take 6.25 mg by mouth 2 (two) times daily with a meal.    Historical Provider, MD  HYDROcodone-acetaminophen (NORCO/VICODIN) 5-325 MG tablet Take 1-2 tablets by mouth every 4 (four) hours as needed for moderate pain. Patient not taking: Reported on 01/21/2016 11/11/15   [name], MD  megestrol (MEGACE) 400 MG/10ML suspension Take 5 mLs (200 mg total) by mouth 2 (two) times daily. Patient not taking: Reported on 01/21/2016 11/22/15   [name], MD  ondansetron (ZOFRAN-ODT) 4 MG disintegrating tablet Take 4 mg by mouth every 8 (eight) hours as needed for nausea or vomiting.  11/15/15   Historical Provider, MD  VIAGRA 50 MG tablet  Take 50 mg by mouth daily as needed for erectile dysfunction.  08/16/15   Historical Provider, MD     Family History  Problem Relation Age of Onset  . Heart disease Mother   . Emphysema Father     Social History   Social History  . Marital status: Married    Spouse name: N/A  . Number of children: N/A  . Years of education: N/A   Social History Main Topics  . Smoking status: Never Smoker  . Smokeless tobacco: Never Used     Comment: states his father died of emphysema from smoking, he did not want to be like that  . Alcohol use 1.8 oz/week    3 Glasses of wine per week     Comment: occasionally  . Drug use: No  . Sexual activity: Yes   Other Topics Concern  . Not on file   Social History Narrative  . No narrative on file     Vital Signs: BP 121/69 (BP Location: Left Arm, Patient Position: Sitting, Cuff Size: Normal)   Pulse (!) 112   Temp 98.4 F (36.9 C) (Oral)  Resp 14   Ht 5' 10"  (1.778 m)   Wt 159 lb (72.1 kg)   SpO2 99%   BMI 22.81 kg/m   Physical Exam  Constitutional: He is oriented to person, place, and time.  Abdominal: Soft.  Musculoskeletal: Normal range of motion.  Neurological: He is alert and oriented to person, place, and time.  Skin: Skin is warm and dry.  Site is clean and dry NT No bleeding Flushed with 10 cc sterile saline with Dr Barbie Banner Flushing well   Psychiatric: He has a normal mood and affect. His behavior is normal.  Nursing note and vitals reviewed.  CT read by Dr Barbie Banner today. ADDENDUM: After further review of the history, the patient underwent pancreatic tail mass for section in the fluid collection was a postoperative fluid collection. Given these findings, of the persistent fluid collection may simply represent a persistent pancreatic fluid collection with pancreatic leak rather than malignancy. The patient will be followed in 2 weeks after flushing the drain daily.  Imaging: No results found.  Labs:  CBC:  Recent  Labs  11/27/15 0508 11/28/15 0425 11/29/15 0433 11/30/15 0454  WBC 25.7* 23.6* 18.9* 13.0*  HGB 11.5* 10.1* 10.5* 9.7*  HCT 35.8* 29.8* 32.5* 29.8*  PLT 750* 563* 623* 574*    COAGS:  Recent Labs  10/04/15 0720 11/05/15 0523 11/26/15 1048  INR 1.00 1.04 1.59    BMP:  Recent Labs  11/26/15 0510 11/26/15 0518 11/27/15 0508 11/28/15 0425 11/29/15 0433  NA 134* 135 133* 130* 130*  K 4.2 4.3 5.2* 4.2 3.8  CL 100* 99* 101 99* 99*  CO2 25  --  24 23 24   GLUCOSE 139* 136* 208* 171* 155*  BUN 13 13 11 15 10   CALCIUM 9.0  --  8.2* 7.6* 7.9*  CREATININE 1.24 1.10 1.15 1.58* 1.08  GFRNONAA 57*  --  >60 43* >60  GFRAA >60  --  >60 49* >60    LIVER FUNCTION TESTS:  Recent Labs  10/09/15 1434 11/06/15 0323 11/26/15 0510  BILITOT 0.4 0.8 0.4  AST 18 34 26  ALT 19 26 21   ALKPHOS 42 38 68  PROT 6.8 6.4* 8.0  ALBUMIN 3.9 3.5 3.1*    Assessment:  Fluid collection still remains; slightly larger per Dr Barbie Banner reading. Recommedation: Restart flushes 2x/day Return to clinic 2 weeks. Pt and wife aware and agreeable to flushes and return visit. Follow with Dr Hassell Done  Signed: Monia Sabal A 01/21/2016, 11:04 AM   Please refer to Dr. Barbie Banner attestation of this note for management and plan.

## 2016-01-21 NOTE — Telephone Encounter (Signed)
Due to overbooks on Cody Suarez's schedule on 02/13/16 I rescheduled this patient's appointment to 02/06/16 at 12noon for abi's and to see NP at 1:15pm. I left a detailed voice message for the patient and also mailed an appt letter. awt

## 2016-01-28 ENCOUNTER — Ambulatory Visit: Payer: 59 | Admitting: Family

## 2016-01-28 ENCOUNTER — Encounter (HOSPITAL_COMMUNITY): Payer: 59

## 2016-02-03 ENCOUNTER — Encounter: Payer: Self-pay | Admitting: Family

## 2016-02-04 ENCOUNTER — Encounter: Payer: Self-pay | Admitting: Radiology

## 2016-02-04 ENCOUNTER — Ambulatory Visit
Admission: RE | Admit: 2016-02-04 | Discharge: 2016-02-04 | Disposition: A | Discharge status: Home / Self Care | Payer: 59 | Source: Ambulatory Visit | Attending: Surgery | Admitting: Surgery

## 2016-02-04 ENCOUNTER — Other Ambulatory Visit: Payer: Self-pay | Admitting: Surgery

## 2016-02-04 DIAGNOSIS — T814XXD Infection following a procedure, subsequent encounter: Principal | ICD-10-CM

## 2016-02-04 DIAGNOSIS — K651 Peritoneal abscess: Principal | ICD-10-CM

## 2016-02-04 DIAGNOSIS — IMO0001 Reserved for inherently not codable concepts without codable children: Secondary | ICD-10-CM

## 2016-02-04 HISTORY — PX: IR GENERIC HISTORICAL: IMG1180011

## 2016-02-04 MED ORDER — IOPAMIDOL (ISOVUE-300) INJECTION 61%
100.0000 mL | Freq: Once | INTRAVENOUS | Status: AC | PRN
  Administered 2016-02-04: 100 mL via INTRAVENOUS

## 2016-02-04 NOTE — Progress Notes (Signed)
Patient ID: Cody Suarez, male   DOB: Dec 04, 1945, 71 y.o.   MRN: [id number]   Referring Physician(s): Martin,Matthew  Chief Complaint: The patient is seen in follow up today s/p percutaneous LUQ drain placement on 11-26-15.  History of present illness:  Cody Suarez is a 71 yo male who underwent Ex Lap with partial colectomy and distal pancreatectomy on 11-05-15.  He developed a LUQ fluid collection and had a drain placed on 11-26-15.  It has remained in place since that time.  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  He has had several CT scans and drain injections since this time.  He presents today for a repeat CT scan and evaluation.  He is having about 15-30cc of output daily.  He is still flushing his drain with 10cc of NS a day.  He denies any pain, fevers, or any other issues exception frustration with his situation.  He did see Dr. Hassell Done last week and there was a possible discussion with Dr. Benay Spice about starting a medication to "dry up secretions" per the patient.    Past Medical History:  Diagnosis Date  . Anemia of chronic disease 2015   "related to cancer tx" (08/09/2013)  . Blood clot in vein 2015  . Colon cancer ([place]) 03/2013  . History of kidney stones    x 2passed them both  . Hypertension   . Peripheral vascular disease ([place])   . Ulcerative colitis ([place])    history    Past Surgical History:  Procedure Laterality Date  . ABDOMINAL AORTAGRAM N/A 08/09/2013   Procedure: ABDOMINAL Maxcine Ham;  Surgeon: [name], MD;  Location: [facility];  Service: Cardiovascular;  Laterality: N/A;  . CHOLECYSTECTOMY N/A 09/08/2014   Procedure: LAPAROSCOPIC CHOLECYSTECTOMY WITH INTRAOPERATIVE CHOLANGIOGRAM;  Surgeon: Armandina Gemma, MD;  Location: [facility];  Service: General;  Laterality: N/A;  . COLON SURGERY    . FEMORAL-POPLITEAL BYPASS GRAFT Right 08/11/2013   Procedure:   RIGHT - POPLITEAL TO PERONEAL ARTERY BYPASS GRAFT  WITH  NONREVERSED SAPHENOUS VEIN GRAFT,tHROMBECTOMY ANTERIOR TIBIALIS,ATTEMPTED THROMBECTOMY TIBIO-PERONEAL TRUNK AND POSTERIOR TIBIALIS, INTRAOPERATIVE ARTERIOGRAM.;  Surgeon: Mal Misty, MD;  Location: [facility];  Service: Vascular;  Laterality: Right;  . FEMORAL-TIBIAL BYPASS GRAFT Right 11/17/2013   Procedure: BYPASS GRAFT RIGHT ABOVE KNEE POPLITEAL TO POSTERIOR TIBIAL ARTERY USING RIGHT NON-REVERSED CEPHALIC VEIN;  Surgeon: Mal Misty, MD;  Location: [facility];  Service: Vascular;  Laterality: Right;  . HERNIA REPAIR  ~ 1995; 03/2013   UHR  . INTRAOPERATIVE ARTERIOGRAM Right 11/17/2013   Procedure: INTRA OPERATIVE ARTERIOGRAM;  Surgeon: Mal Misty, MD;  Location: [facility] OR;  Service: Vascular;  Laterality: Right;  . IR GENERIC HISTORICAL  12/11/2015   IR RADIOLOGIST EVAL & MGMT 12/11/2015 Arne Cleveland, MD [facility] INTERV RAD  . IR GENERIC HISTORICAL  12/24/2015   IR RADIOLOGIST EVAL & MGMT 12/24/2015 Jacqulynn Cadet, MD [facility] INTERV RAD  . IR GENERIC HISTORICAL  01/21/2016   IR RADIOLOGIST EVAL & MGMT 01/21/2016 [name], MD [facility] INTERV RAD  . LAPAROSCOPIC RIGHT HEMI COLECTOMY N/A 03/28/2013   Procedure: LAPAROSCOPIC ASSISTED HEMI COLECTOMY;  Surgeon: Pedro Earls, MD;  Location: [facility];  Service: General;  Laterality: N/A;  . LAPAROSCOPY N/A 11/05/2015   Procedure: LAPAROSCOPY DIAGNOSTIC;  Surgeon: [name], MD;  Location: [facility];  Service: General;  Laterality: N/A;  . LAPAROTOMY N/A 11/05/2015   Procedure: EXPLORATORY LAPAROTOMY, resection of mass at  tail of pancreas;  Surgeon: [name], MD;  Location: [facility];  Service: General;  Laterality: N/A;  . LOWER EXTREMITY ANGIOGRAM Bilateral 08/09/2013  . TEE WITHOUT CARDIOVERSION N/A 08/10/2013   Procedure: TRANSESOPHAGEAL ECHOCARDIOGRAM (TEE);  Surgeon: Candee Furbish, MD;  Location: [facility] ENDOSCOPY;  Service: Cardiovascular;  Laterality: N/A;  . TONSILLECTOMY  ~ 1950  . [place]; 03/2013  . VASECTOMY    . VEIN  HARVEST Right 11/17/2013   Procedure: HARVEST OF RIGHT UPPER EXTREMITY CEPHALIC VEIN;  Surgeon: Mal Misty, MD;  Location: [facility];  Service: Vascular;  Laterality: Right;    Allergies: Amlodipine and Gabapentin  Medications: Prior to Admission medications   Medication Sig Start Date End Date Taking? Authorizing Provider  budesonide (ENTOCORT EC) 3 MG 24 hr capsule Take 6 mg by mouth daily with breakfast.    Yes Historical Provider, MD  losartan (COZAAR) 100 MG tablet Take 1 tablet (100 mg total) by mouth daily. Patient taking differently: Take 100 mg by mouth daily with breakfast. Take 1/2 tablet daily. 02/06/15  Yes [name], MD  metoCLOPramide (REGLAN) 5 MG tablet Take 1 tablet (5 mg total) by mouth 3 (three) times daily before meals. 01/17/16  Yes [name], MD  Multiple Vitamin (MULTIVITAMIN WITH MINERALS) TABS tablet Take 1 tablet by mouth daily.   Yes Historical Provider, MD  XARELTO 20 MG TABS tablet TAKE 1 TABLET DAILY WITH BREAKFAST 01/02/16  Yes [name], MD  zolpidem (AMBIEN) 5 MG tablet Take 5 mg by mouth at bedtime as needed for sleep.   Yes Historical Provider, MD  amoxicillin-clavulanate (AUGMENTIN) 875-125 MG tablet Take 1 tablet by mouth 2 (two) times daily. Patient not taking: Reported on 01/21/2016 11/30/15   [name], MD  carvedilol (COREG) 12.5 MG tablet Take 6.25 mg by mouth 2 (two) times daily with a meal.    Historical Provider, MD  HYDROcodone-acetaminophen (NORCO/VICODIN) 5-325 MG tablet Take 1-2 tablets by mouth every 4 (four) hours as needed for moderate pain. Patient not taking: Reported on 01/21/2016 11/11/15   [name], MD  megestrol (MEGACE) 400 MG/10ML suspension Take 5 mLs (200 mg total) by mouth 2 (two) times daily. Patient not taking: Reported on 01/21/2016 11/22/15   [name], MD  ondansetron (ZOFRAN-ODT) 4 MG disintegrating tablet Take 4 mg by mouth every 8 (eight) hours as needed for nausea or vomiting.  11/15/15    Historical Provider, MD  spironolactone (ALDACTONE) 25 MG tablet TAKE 1 TABLET DAILY Patient not taking: Reported on 02/04/2016 08/12/15   [name], MD  VIAGRA 50 MG tablet Take 50 mg by mouth daily as needed for erectile dysfunction.  08/16/15   Historical Provider, MD     Family History  Problem Relation Age of Onset  . Heart disease Mother   . Emphysema Father     Social History   Social History  . Marital status: Married    Spouse name: N/A  . Number of children: N/A  . Years of education: N/A   Social History Main Topics  . Smoking status: Never Smoker  . Smokeless tobacco: Never Used     Comment: states his father died of emphysema from smoking, he did not want to be like that  . Alcohol use 1.8 oz/week    3 Glasses of wine per week     Comment: occasionally  . Drug use: No  . Sexual activity: Yes   Other Topics Concern  .  Not on file   Social History Narrative  . No narrative on file     Vital Signs: BP 123/69 (BP Location: Left Arm, Patient Position: Sitting, Cuff Size: Normal)   Pulse 94   Temp 97.9 F (36.6 C) (Oral)   Resp 14   Ht 5' 10"  (1.778 m)   Wt 154 lb (69.9 kg)   SpO2 99%   BMI 22.10 kg/m   Physical Exam  Gen: NAD Abd: soft, NT, ND, drain in LUQ with minimal output currently.  Drain site is c/d/i  Imaging: Ct Abdomen Pelvis W Contrast  Result Date: 02/04/2016 CLINICAL DATA:  History of colon cancer with local recurrence necessitating partial colonic resection and distal pancreatectomy, complicated by pancreatic leak, post CT-guided percutaneous drainage catheter placement on 11/26/2015. Patient returns today to the interventional radiology clinic for percutaneous drainage catheter evaluation and management. Patient continues to flush the percutaneous drainage catheter with 10 cc of saline per day. He has maintained diligent output records with volumes between 30-40 cc per day. No fever or chills. EXAM: CT ABDOMEN AND PELVIS WITH CONTRAST  TECHNIQUE: Multidetector CT imaging of the abdomen and pelvis was performed using the standard protocol following bolus administration of intravenous contrast. CONTRAST:  160m ISOVUE-300 IOPAMIDOL (ISOVUE-300) INJECTION 61% Creatinine was obtained on site at [facility] [address] Results: Creatinine 1.1 mg/dL. COMPARISON:  CT abdomen pelvis - 01/21/2016; 12/24/2015; 11/26/2015; CT-guided left upper quadrant percutaneous drainage catheter placement - 11/26/2015; fluoroscopic guided percutaneous drainage catheter injection - 12/11/2015 ; CT of the chest, abdomen and pelvis - 08/10/2013 FINDINGS: Lower chest: Limited visualization of lower thorax demonstrates minimal dependent subpleural ground-glass atelectasis. There is minimal subsegmental atelectasis within the left costophrenic angle, unchanged. Ill-defined punctate (approximate 5 mm) ground-glass opacity within right middle lobe (image 1, series 4) is unchanged since the 08/2013 examination and thus favored to be of benign etiology. Normal heart size.  No pericardial effusion. Hepatobiliary: Normal hepatic contour. Diffuse decreased attenuation hepatic parenchyma suggestive of hepatic steatosis on this postcontrast examination. There is a minimal amount of focal fatty infiltration adjacent to the fissure for ligamentum teres. No discrete hepatic lesions. Post cholecystectomy. No intra extrahepatic bili duct dilatation. No ascites. Pancreas: Stable sequela of prior distal pancreatectomy. Unchanged positioning of percutaneous drainage catheter with end coiled and locked about the pancreatic tail resection bed. Ill-defined serpiginous fluid collection about the pancreatic tail has is unchanged to minimally decreased in size, currently measuring approximately 4.8 x 2.1 x 4.2 cm (as measured in greatest oblique coronal image 56, series 601, axial image 26, series 3), previously, 6.2 x 3.3 x 4.8 cm. No new definable/drainable fluid collections.  Spleen: Normal appearance of the spleen Adrenals/Urinary Tract: There is symmetric enhancement of the bilateral kidneys. There are 2 punctate nonobstructing stones within the inferior pole of the right kidney with dominant stone measuring 5 mm in diameter (34, series 3). No left-sided renal stones in this postcontrast examination. Bilateral punctate subcentimeter hypoattenuating renal lesions are too small to adequately characterize of favored to represent renal cysts. Notes urinary obstruction. Normal appearance of the bilateral adrenal glands. Normal appearance of the urinary bladder given underdistention. Stomach/Bowel: Stable sequela of prior colonic resection with anastomotic suture line within the left upper abdominal quadrant. Moderate colonic stool burden without evidence of enteric obstruction. Normal appearance of the terminal ileum and appendix. No pneumoperitoneum, pneumatosis or portal venous gas. Vascular/Lymphatic: Moderate amount of mixed calcified and noncalcified atherosclerotic plaque within a normal caliber abdominal aorta. The  major branch vessels of the abdominal aorta appear patent on this non CTA examination. No bulky retroperitoneal, mesenteric, pelvic or inguinal lymphadenopathy. Reproductive: Normal appearance of the pelvic organs. No free fluid in the pelvic cul-de-sac. Other: Regional soft tissues appear normal. Musculoskeletal: No acute or aggressive osseous abnormalities. Mild-to-moderate multilevel lumbar spine DDD, worse at L5-S1 with disc space height loss, endplate irregularity and sclerosis. Mild degenerative change of the bilateral hips with joint space loss, subchondral sclerosis and osteophytosis. Bilateral facet degenerative change of the lower lumbar spine. IMPRESSION: 1. No change to slight decrease in size of residual pancreatic tail fluid collection, currently measuring 4.8 cm, previously, 6.2 cm. No new definable/drainable fluid collections. 2. Nonobstructing right-sided  nephrolithiasis. 3. Ill-defined approximately 5 mm ground-glass nodule within the right middle lobe is unchanged since the 08/2013 examination and favored to be of benign etiology. PLAN: 1. Patient was instructed to no longer flush the percutaneous drainage catheter. 2. Patient was instructed to maintain diligent records regarding the drainage catheter output. 3. Patient was encouraged to keep all subsequent follow-up appointments with Dr. Hassell Done Dr. Benay Spice. 4. Patient will return to the interventional radiology clinic in 3 weeks for a contrast enhanced IV only abdominal CT. Patient was instructed to call the interventional radiology clinic if he were to experience a several day period of no a thickened percutaneous drainage catheter output. The patient and the patient's wife demonstrated fair understanding of the above discussion. Electronically Signed   By: Sandi Mariscal M.D.   On: 02/04/2016 13:43    Labs:  CBC:  Recent Labs  11/27/15 0508 11/28/15 0425 11/29/15 0433 11/30/15 0454  WBC 25.7* 23.6* 18.9* 13.0*  HGB 11.5* 10.1* 10.5* 9.7*  HCT 35.8* 29.8* 32.5* 29.8*  PLT 750* 563* 623* 574*    COAGS:  Recent Labs  10/04/15 0720 11/05/15 0523 11/26/15 1048  INR 1.00 1.04 1.59    BMP:  Recent Labs  11/26/15 0510 11/26/15 0518 11/27/15 0508 11/28/15 0425 11/29/15 0433  NA 134* 135 133* 130* 130*  K 4.2 4.3 5.2* 4.2 3.8  CL 100* 99* 101 99* 99*  CO2 25  --  24 23 24   GLUCOSE 139* 136* 208* 171* 155*  BUN 13 13 11 15 10   CALCIUM 9.0  --  8.2* 7.6* 7.9*  CREATININE 1.24 1.10 1.15 1.58* 1.08  GFRNONAA 57*  --  >60 43* >60  GFRAA >60  --  >60 49* >60    LIVER FUNCTION TESTS:  Recent Labs  10/09/15 1434 11/06/15 0323 11/26/15 0510  BILITOT 0.4 0.8 0.4  AST 18 34 26  ALT 19 26 21   ALKPHOS 42 38 68  PROT 6.8 6.4* 8.0  ALBUMIN 3.9 3.5 3.1*    Assessment:  1. LUQ fluid collection secondary to pancreatic leak, s/p perc drain placement on 11-26-15  The CT scan  still shows some fluid around his drain c/w continued leak.  He is also still having about 15-30cc/day.  He and his wife are flushing his drain with about 10cc of NS a day.  We have informed them to stop flushing his drain to possibly help with drying up the leak.  He will continue with his drain in place and routine management.  We will plan to see him back in 3-4 weeks for a repeat CT scan with no plans for a drain injection.  He will call us sooner if he has essentially no output for about a week.  The patient and wife understand this plan.  All questions were answered.  Signed: Henreitta Cea 02/04/2016, 1:48 PM   Please refer to Dr. Pascal Lux' attestation of this note for management and plan.

## 2016-02-06 ENCOUNTER — Ambulatory Visit (HOSPITAL_COMMUNITY)
Admission: RE | Admit: 2016-02-06 | Discharge: 2016-02-06 | Disposition: A | Discharge status: Home / Self Care | Payer: 59 | Source: Ambulatory Visit | Attending: Family | Admitting: Family

## 2016-02-06 ENCOUNTER — Ambulatory Visit (INDEPENDENT_AMBULATORY_CARE_PROVIDER_SITE_OTHER): Payer: 59 | Admitting: Family

## 2016-02-06 ENCOUNTER — Encounter: Payer: Self-pay | Admitting: Family

## 2016-02-06 VITALS — BP 121/71 | HR 102 | Temp 97.3°F | Resp 20 | Ht 70.0 in | Wt 152.2 lb

## 2016-02-06 DIAGNOSIS — I779 Disorder of arteries and arterioles, unspecified: Secondary | ICD-10-CM

## 2016-02-06 DIAGNOSIS — I739 Peripheral vascular disease, unspecified: Secondary | ICD-10-CM | POA: Diagnosis not present

## 2016-02-06 NOTE — Patient Instructions (Signed)

## 2016-02-06 NOTE — Progress Notes (Signed)
[facility] [place]   CC: Follow up peripheral artery occlusive disease  History of Present Illness Cody Suarez is a 71 y.o. male patient whom Dr. Kellie Simmering has been following returns for continued follow-up regarding his lower extremity occlusive disease. He has a history of colon cancer which was treated surgically in 2015 and he is followed by Dr. Julieanne Manson in the cancer clinic. He had recurrence of cancer, of the splenic flexure, and had a laparotomy on 11-05-15 for this. He then developed an abscess near his pancreas and has a drain connected to this since this had been addressed. He states he is not on antibiotics now, was for a while.   His walking has been limited due to the above set back. His nausea has improved.  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   He had 2 failed attempts at revascularization of the right leg due to his small diseased tibial vessels but fortunately he has maintained limb salvage. He denies any rest pain or nonhealing ulcers. There is no numbness in the feet. He does have claudication in both calves right worse than left if he ambulates quickly.  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  He denies any history of stroke or TIA.   Pt Diabetic: No Pt smoker: non-smoker  Pt meds include: Statin :No Betablocker: No ASA: No Other anticoagulants/antiplatelets: Xarelto   Past Medical History:  Diagnosis Date  . Anemia of chronic disease 2015   "related to cancer tx" (08/09/2013)  . Blood clot in vein 2015  . Colon cancer ([place]) 03/2013  . History of kidney stones    x 2passed them both  . Hypertension   . Peripheral vascular disease ([place])   . Ulcerative colitis ([place])    history    Social History Social History  Substance  Use Topics  . Smoking status: Never Smoker  . Smokeless tobacco: Never Used     Comment: states his father died of emphysema from smoking, he did not want to be like that  . Alcohol use 1.8 oz/week    3 Glasses of wine per week     Comment: occasionally    Family History Family History  Problem Relation Age of Onset  . Heart disease Mother   . Emphysema Father     Past Surgical History:  Procedure Laterality Date  . ABDOMINAL AORTAGRAM N/A 08/09/2013   Procedure: ABDOMINAL Maxcine Ham;  Surgeon: [name], MD;  Location: [facility] CATH LAB;  Service: Cardiovascular;  Laterality: N/A;  . CHOLECYSTECTOMY N/A 09/08/2014   Procedure: LAPAROSCOPIC CHOLECYSTECTOMY WITH INTRAOPERATIVE CHOLANGIOGRAM;  Surgeon: Armandina Gemma, MD;  Location: [facility];  Service: General;  Laterality: N/A;  . COLON SURGERY  02/2013  . COLON SURGERY  10/2015  . FEMORAL-POPLITEAL BYPASS GRAFT Right 08/11/2013   Procedure:   RIGHT - POPLITEAL TO PERONEAL ARTERY BYPASS GRAFT  WITH NONREVERSED SAPHENOUS VEIN GRAFT,tHROMBECTOMY ANTERIOR TIBIALIS,ATTEMPTED THROMBECTOMY TIBIO-PERONEAL TRUNK AND POSTERIOR TIBIALIS, INTRAOPERATIVE ARTERIOGRAM.;  Surgeon: Mal Misty, MD;  Location: [facility];  Service: Vascular;  Laterality: Right;  . FEMORAL-TIBIAL BYPASS GRAFT Right 11/17/2013   Procedure: BYPASS GRAFT RIGHT ABOVE KNEE POPLITEAL TO POSTERIOR TIBIAL ARTERY USING RIGHT NON-REVERSED CEPHALIC VEIN;  Surgeon: Mal Misty, MD;  Location: [place];  Service:  Vascular;  Laterality: Right;  . HERNIA REPAIR  ~ 1995; 03/2013   UHR  . INTRAOPERATIVE ARTERIOGRAM Right 11/17/2013   Procedure: INTRA OPERATIVE ARTERIOGRAM;  Surgeon: Mal Misty, MD;  Location: [facility] OR;  Service: Vascular;  Laterality: Right;  . IR GENERIC HISTORICAL  12/11/2015   IR RADIOLOGIST EVAL & MGMT 12/11/2015 Arne Cleveland, MD [facility] INTERV RAD  . IR GENERIC HISTORICAL  12/24/2015   IR RADIOLOGIST EVAL & MGMT 12/24/2015 Jacqulynn Cadet, MD [facility] INTERV RAD  . IR GENERIC  HISTORICAL  01/21/2016   IR RADIOLOGIST EVAL & MGMT 01/21/2016 [name], MD [facility] INTERV RAD  . IR GENERIC HISTORICAL  02/04/2016   IR RADIOLOGIST EVAL & MGMT 02/04/2016 Sandi Mariscal, MD [facility] INTERV RAD  . LAPAROSCOPIC RIGHT HEMI COLECTOMY N/A 03/28/2013   Procedure: LAPAROSCOPIC ASSISTED HEMI COLECTOMY;  Surgeon: Pedro Earls, MD;  Location: [facility];  Service: General;  Laterality: N/A;  . LAPAROSCOPY N/A 11/05/2015   Procedure: LAPAROSCOPY DIAGNOSTIC;  Surgeon: [name], MD;  Location: [facility];  Service: General;  Laterality: N/A;  . LAPAROTOMY N/A 11/05/2015   Procedure: EXPLORATORY LAPAROTOMY, resection of mass at tail of pancreas;  Surgeon: [name], MD;  Location: [facility];  Service: General;  Laterality: N/A;  . LOWER EXTREMITY ANGIOGRAM Bilateral 08/09/2013  . TEE WITHOUT CARDIOVERSION N/A 08/10/2013   Procedure: TRANSESOPHAGEAL ECHOCARDIOGRAM (TEE);  Surgeon: Candee Furbish, MD;  Location: [facility] ENDOSCOPY;  Service: Cardiovascular;  Laterality: N/A;  . TONSILLECTOMY  ~ 1950  . [place]; 03/2013  . VASECTOMY    . VEIN HARVEST Right 11/17/2013   Procedure: HARVEST OF RIGHT UPPER EXTREMITY CEPHALIC VEIN;  Surgeon: Mal Misty, MD;  Location: [place];  Service: Vascular;  Laterality: Right;    Allergies  Allergen Reactions  . Amlodipine Swelling    Right LE edema  . Gabapentin Rash    Short-term memory decrease    Current Outpatient Prescriptions  Medication Sig Dispense Refill  . budesonide (ENTOCORT EC) 3 MG 24 hr capsule Take 6 mg by mouth daily with breakfast.     . losartan (COZAAR) 100 MG tablet Take 1 tablet (100 mg total) by mouth daily. (Patient taking differently: Take 100 mg by mouth daily with breakfast. Take 1/2 tablet daily.) 90 tablet 3  . metoCLOPramide (REGLAN) 5 MG tablet Take 1 tablet (5 mg total) by mouth 3 (three) times daily before meals. 90 tablet 0  . Multiple Vitamin (MULTIVITAMIN WITH MINERALS) TABS tablet Take 1 tablet by mouth  daily.    . ondansetron (ZOFRAN-ODT) 4 MG disintegrating tablet Take 4 mg by mouth every 8 (eight) hours as needed for nausea or vomiting.     Alveda Reasons 20 MG TABS tablet TAKE 1 TABLET DAILY WITH BREAKFAST 30 tablet 9  . zolpidem (AMBIEN) 5 MG tablet Take 5 mg by mouth at bedtime as needed for sleep.    Marland Kitchen amoxicillin-clavulanate (AUGMENTIN) 875-125 MG tablet Take 1 tablet by mouth 2 (two) times daily. (Patient not taking: Reported on 01/21/2016) 28 tablet 3  . carvedilol (COREG) 12.5 MG tablet Take 6.25 mg by mouth 2 (two) times daily with a meal.    . HYDROcodone-acetaminophen (NORCO/VICODIN) 5-325 MG tablet Take 1-2 tablets by mouth every 4 (four) hours as needed for moderate pain. (Patient not taking: Reported on 01/21/2016) 30 tablet 0  . megestrol (MEGACE) 400 MG/10ML suspension Take 5 mLs (200 mg total) by mouth 2 (two) times daily. (Patient not taking: Reported on 01/21/2016)  300 mL 0  . spironolactone (ALDACTONE) 25 MG tablet TAKE 1 TABLET DAILY (Patient not taking: Reported on 02/04/2016) 30 tablet 11  . VIAGRA 50 MG tablet Take 50 mg by mouth daily as needed for erectile dysfunction.   5   No current facility-administered medications for this visit.     ROS: See HPI for pertinent positives and negatives.   Physical Examination  Vitals:   02/06/16 1220 02/06/16 1229  BP: (!) 143/77 121/71  Pulse: (!) 102   Resp: 20   Temp: 97.3 F (36.3 C)   TempSrc: Oral   SpO2: 100%   Weight: 152 lb 3.2 oz (69 kg)   Height: 5' 10"  (1.778 m)    Body mass index is 21.84 kg/m.  General: A&O x 3, WDWN, male. Gait: slow, steady Eyes: PERRLA. Pulmonary: Respirations are non labored, CTAB, good air movement in all fields. Cardiac: regular rhythm, no detected murmur.         Carotid Bruits Right Left   Negative Negative  Aorta is not palpable. Radial pulses: 2+ palpable and =                           VASCULAR EXAM: Extremities without ischemic changes, without Gangrene; without open  wounds.                                                                                                          LE Pulses Right Left       FEMORAL  2+ palpable  2+ palpable        POPLITEAL  not palpable   not palpable       POSTERIOR TIBIAL  not palpable   1+ palpable        DORSALIS PEDIS      ANTERIOR TIBIAL 1+ palpable  not palpable    Abdomen: soft, NT, no masses. Drain from left UQ abdomen draining into leg bag, small amount.  Skin: no rashes, no ulcers noted. Musculoskeletal: no muscle wasting or atrophy.  Neurologic: A&O X 3; Appropriate Affect ; SENSATION: normal; MOTOR FUNCTION:  moving all extremities equally, motor strength 5/5 throughout. Speech is fluent/normal. CN 2-12 intact.    Non-Invasive Vascular Imaging: DATE: 02/06/2016 ABI: RIGHT: 0.57 (was 0.63 on 01-22-15), Waveforms: possibly monophasic; TBI: 0.25 (was 0.27).  LEFT: 0.69 (was 0.88), Waveforms: possibly biphasic; TBI: 0.45 (was 0.48) Bilateral ABI's declined slightly, bilateral TBI's remain stable.   ASSESSMENT: RIEL HIRSCHMAN is a 71 y.o. male who is s/p failed right popliteal-peroneal artery bypass graft on 08-09-13 and failed right popliteal to posterior tibial arterial bypass graft on 11-17-13. This was due to his small diseased tibial vessels but fortunately he has maintained limb salvage.  He was walking 8 hours/day on his job until he developed recurrent colon cancer in October 2017, had several major setbacks, with significant weight loss and weakness.  He has no signs of ischemia in his feet/legs.   PLAN:  Gradual resumption of graduated walking program. Daily seated leg exercises  discussed and demonstrated.  Based on the patient's vascular studies and examination, pt will return to clinic in 3 months with ABI's, I advised pt and wife to notify us if he develops concerns re the circulation in his feet/legs.   I discussed in depth with the patient the nature of atherosclerosis, and emphasized the  importance of maximal medical management including strict control of blood pressure, blood glucose, and lipid levels, obtaining regular exercise, and continued cessation of smoking.  The patient is aware that without maximal medical management the underlying atherosclerotic disease process will progress, limiting the benefit of any interventions.  The patient was given information about PAD including signs, symptoms, treatment, what symptoms should prompt the patient to seek immediate medical care, and risk reduction measures to take.  Clemon Chambers, RN, MSN, FNP-C [facility] Phone: [phone]  Clinic MD: Oneida Alar  02/06/16 12:51 PM

## 2016-02-06 NOTE — Progress Notes (Signed)
Vitals:   02/06/16 1220 02/06/16 1229  BP: (!) 143/77 121/71  Pulse: (!) 102   Resp: 20   Temp: 97.3 F (36.3 C)   TempSrc: Oral   SpO2: 100%   Weight: 152 lb 3.2 oz (69 kg)   Height: 5' 10"  (1.778 m)

## 2016-02-10 NOTE — Addendum Note (Signed)
Addended by: Lianne Cure A on: 02/10/2016 04:56 PM   Modules accepted: Orders

## 2016-02-12 ENCOUNTER — Other Ambulatory Visit: Payer: Self-pay | Admitting: Nurse Practitioner

## 2016-02-12 ENCOUNTER — Encounter (HOSPITAL_COMMUNITY): Payer: Self-pay | Admitting: Interventional Radiology

## 2016-02-12 ENCOUNTER — Ambulatory Visit (HOSPITAL_COMMUNITY)
Admission: RE | Admit: 2016-02-12 | Discharge: 2016-02-12 | Disposition: A | Discharge status: Home / Self Care | Payer: 59 | Source: Ambulatory Visit | Attending: Nurse Practitioner | Admitting: Nurse Practitioner

## 2016-02-12 ENCOUNTER — Ambulatory Visit (HOSPITAL_BASED_OUTPATIENT_CLINIC_OR_DEPARTMENT_OTHER): Payer: 59 | Admitting: Nurse Practitioner

## 2016-02-12 VITALS — BP 144/65 | HR 108 | Temp 97.9°F | Resp 19 | Wt 151.9 lb

## 2016-02-12 DIAGNOSIS — I10 Essential (primary) hypertension: Secondary | ICD-10-CM

## 2016-02-12 DIAGNOSIS — C7889 Secondary malignant neoplasm of other digestive organs: Secondary | ICD-10-CM

## 2016-02-12 DIAGNOSIS — L02211 Cutaneous abscess of abdominal wall: Secondary | ICD-10-CM | POA: Insufficient documentation

## 2016-02-12 DIAGNOSIS — Z4803 Encounter for change or removal of drains: Secondary | ICD-10-CM | POA: Insufficient documentation

## 2016-02-12 DIAGNOSIS — R630 Anorexia: Secondary | ICD-10-CM

## 2016-02-12 DIAGNOSIS — R911 Solitary pulmonary nodule: Secondary | ICD-10-CM | POA: Diagnosis not present

## 2016-02-12 DIAGNOSIS — D509 Iron deficiency anemia, unspecified: Secondary | ICD-10-CM | POA: Diagnosis not present

## 2016-02-12 DIAGNOSIS — C786 Secondary malignant neoplasm of retroperitoneum and peritoneum: Secondary | ICD-10-CM | POA: Diagnosis not present

## 2016-02-12 DIAGNOSIS — T83098A Other mechanical complication of other indwelling urethral catheter, initial encounter: Secondary | ICD-10-CM

## 2016-02-12 DIAGNOSIS — C184 Malignant neoplasm of transverse colon: Secondary | ICD-10-CM | POA: Diagnosis not present

## 2016-02-12 DIAGNOSIS — R627 Adult failure to thrive: Secondary | ICD-10-CM

## 2016-02-12 HISTORY — PX: IR GENERIC HISTORICAL: IMG1180011

## 2016-02-12 MED ORDER — LIDOCAINE HCL 1 % IJ SOLN
INTRAMUSCULAR | Status: AC
  Filled 2016-02-12: qty 20

## 2016-02-12 MED ORDER — IOPAMIDOL (ISOVUE-300) INJECTION 61%
INTRAVENOUS | Status: AC
  Administered 2016-02-12: 10 mL via INTRAVENOUS
  Filled 2016-02-12: qty 50

## 2016-02-12 MED ORDER — LIDOCAINE HCL 1 % IJ SOLN
INTRAMUSCULAR | Status: DC | PRN
  Administered 2016-02-12: 10 mL

## 2016-02-12 MED ORDER — IOPAMIDOL (ISOVUE-300) INJECTION 61%
50.0000 mL | Freq: Once | INTRAVENOUS | Status: AC | PRN
  Administered 2016-02-12: 10 mL via INTRAVENOUS

## 2016-02-12 NOTE — Procedures (Signed)
Interventional Radiology Procedure Note  HPI: Pt reports drainage at the site.  Drain has been partially withdrawn.   Procedure: Fluoro guided drain replacement. 75F drain into left upper quadrant fluid collection.  ~20-30 cc milky fluid aspirated.   Complications: None Recommendations:  - Follow up appointment scheduled in clinic next week.  Observe appointment.  - Do not submerge   - Routine care   Signed,  Dulcy Fanny. Earleen Newport, DO

## 2016-02-12 NOTE — Progress Notes (Addendum)
[place] OFFICE PROGRESS NOTE   Diagnosis:  Colon cancer  INTERVAL HISTORY:   Cody Suarez returns as scheduled. He is no longer having nausea. He notes minimal drainage in the collection bag, none over the past 3 days. His wife has noted significant drainage at the catheter site over the past 2 weeks. He denies fever. No diarrhea. Appetite remains poor. He tried Megace several months ago and discontinued after a few doses due to nausea/vomiting.  Objective:  Vital signs in last 24 hours:  Blood pressure (!) 144/65, pulse (!) 108, temperature 97.9 F (36.6 C), temperature source Oral, resp. rate 19, weight 151 lb 14.4 oz (68.9 kg), SpO2 100 %.    HEENT: No thrush or ulcers. Resp: Lungs clear bilaterally. Cardio: Regular rate and rhythm. GI: Abdomen soft and nontender. No hepatomegaly. Abdomen does not appear distended. Large amount of thick yellow/brown drainage around the drainage catheter site. Vascular: No leg edema.   Lab Results:  Lab Results  Component Value Date   WBC 13.0 (H) 11/30/2015   HGB 9.7 (L) 11/30/2015   HCT 29.8 (L) 11/30/2015   MCV 87.6 11/30/2015   PLT 574 (H) 11/30/2015   NEUTROABS 18.4 (H) 11/26/2015    Imaging:  No results found.  Medications: I have reviewed the patient's current medications.  Assessment/Plan: 1. 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 Negative for a BRAF mutation  Tumor invaded through the muscularis propria into pericolonic fatty tissue and involved the attached omentum.   Cycle 1 adjuvant Xeloda 05/28/2013.   Cycle 2 adjuvant Xeloda 06/18/2013.   Cycle 3 adjuvant Xeloda 07/09/2013.   Cycle 4 adjuvant Xeloda 07/30/2013.   Cycle 5 adjuvant Xeloda 08/20/2013.  Cycle 6 adjuvant Xeloda 09/11/2013.  cycle 7 adjuvant  Xeloda 10/02/2013  Cycle 8 adjuvant Xeloda 10/23/2013  CTs of the chest, abdomen, and pelvis on 07/18/2014-negative for recurrent colon cancer   CTs abdomen/pelvis 09/06/2014 showed acute cholecystitis.  Colonoscopy 08/02/2015-chronic colitis, no malignancy  CTs 09/17/2015, new mass near the tail the pancreas, stable lung nodules  Biopsy mesenteric mass 10/04/2015 with metastatic adenocarcinoma consistent with colorectal primary  PET scan 10/10/2015 with soft tissue thickening within the peritoneal space of the left upper quadrant with SUV Max 7.9; no additional sites of peritoneal nodularity to suggest metastasis; hypermetabolic ill-defined nodule in the left lower lobe favored inflammatory.  Resection of the left abdominal mesenteric mass/tail the pancreas on 11/05/2015 with the pathology confirming metastatic colon cancer, positive "serosal" margin 2. Ulcerative colitis-extensive chronic active ulcerative colitis was noted on the colon resection specimen 03/28/2013.  Followed by Dr.Magod 3. Hypertension.  4. History of microcytic anemia-likely iron deficiency, unable to tolerate oral iron. Improved. 5. Possible area of cecal wall thickening noted on abdominal CT 03/20/2013. 6. Family history of colon cancer (maternal grandfather died in his 30s with colon cancer). 7. Bilateral calf and low anterior leg pain. Bilateral lower extremity venous duplex negative for DVT 07/14/2013. Right ABI with moderate, borderline severe arterial insufficiency; left ABI suggestive of moderate arterial insufficiency. He was referred to vascular. 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 TEE showed thrombus in the descending aorta. He underwent right popliteal to peroneal artery bypass graft, thrombectomy anterior tibialis and attempted thrombectomy tibioperoneal trunk and posterior tibialis on 08/11/2013. Right calf pain 09/26/2013 with findings of occlusion of right popliteal to  peroneal bypass status  post thrombolysis. Now on anticoagulation with Xarelto.  recurrent pain and ulceration at the right foot, status post a popliteal to posterior tibial artery bypass using a cephalic vein graft on 13/68/5992 8. Status post laparoscopic cholecystectomy 09/08/2014 9. Anorexia following the mesenteric mass/pancreas tail resection 10. Abdominal abscess November 2017-pancreas "cysts "drainage catheter placed 11/26/2015; CT 02/04/2016 with no change to slight decrease in size of residual pancreatic tail fluid collection.   Disposition: Cody Suarez appears unchanged. The percutaneous drainage catheter remains in place. He is having minimal drainage into the collection bag. He has noticed a significant increase in drainage around the catheter site. We will ask interventional radiology to evaluate.  He continues to have anorexia and weight loss. He is agreeable to resuming Megace. We made a referral to the [place] dietitian as well.  Cody Suarez will return for a follow-up visit in 6-8 weeks.   Patient seen with Dr. Benay Spice.  Ned Card ANP/GNP-BC   02/12/2016  12:33 PM This was a shared visit with Ned Card. Cody Suarez was interviewed and examined. He continues to have anorexia and failure to thrive in the setting of chronic draining from the pancreas. There is drainage around the left upper quadrant drain tube today. We will refer him to interventional radiology for evaluation of the tube. He will begin a trial of Megace as an appetite stimulant. He continues follow-up with Dr. Hassell Done.  Julieanne Manson, M.D.

## 2016-02-13 ENCOUNTER — Encounter (HOSPITAL_COMMUNITY): Payer: 59

## 2016-02-13 ENCOUNTER — Ambulatory Visit: Payer: 59 | Admitting: Family

## 2016-02-13 ENCOUNTER — Telehealth: Payer: Self-pay

## 2016-02-13 MED ORDER — MEGESTROL ACETATE 400 MG/10ML PO SUSP: 200.0000 mg | Freq: Two times a day (BID) | ORAL | 0 refills | Status: DC

## 2016-02-13 NOTE — Telephone Encounter (Signed)
Pt called stating Dr Benay Spice did not send an apetite stimulant Rx to pharmacy that they had talked about. OV note did mention retrial of megace. Previous order refilled.   Pt asked that Dory Peru call him. S/w Barb.

## 2016-02-20 ENCOUNTER — Other Ambulatory Visit: Payer: Self-pay | Admitting: Radiology

## 2016-02-20 ENCOUNTER — Ambulatory Visit
Admission: RE | Admit: 2016-02-20 | Discharge: 2016-02-20 | Disposition: A | Discharge status: Home / Self Care | Payer: 59 | Source: Ambulatory Visit | Attending: Surgery | Admitting: Surgery

## 2016-02-20 ENCOUNTER — Encounter: Payer: Self-pay | Admitting: Radiology

## 2016-02-20 DIAGNOSIS — IMO0001 Reserved for inherently not codable concepts without codable children: Secondary | ICD-10-CM

## 2016-02-20 DIAGNOSIS — K651 Peritoneal abscess: Principal | ICD-10-CM

## 2016-02-20 DIAGNOSIS — T814XXD Infection following a procedure, subsequent encounter: Principal | ICD-10-CM

## 2016-02-20 HISTORY — PX: IR GENERIC HISTORICAL: IMG1180011

## 2016-02-20 MED ORDER — IOPAMIDOL (ISOVUE-300) INJECTION 61%
100.0000 mL | Freq: Once | INTRAVENOUS | Status: AC | PRN
  Administered 2016-02-20: 100 mL via INTRAVENOUS

## 2016-02-20 MED ORDER — SODIUM CHLORIDE 0.9 % IJ SOLN: 10.0000 mL | Freq: Three times a day (TID) | INTRAMUSCULAR | Status: DC

## 2016-02-20 MED FILL — MONOJECT 0.9% NA CL SYRING: 0.9 | 14 days supply | Qty: 420 | Fill #0

## 2016-02-20 NOTE — Progress Notes (Signed)
Referring Physician(s): Martin,Matthew  Chief Complaint: The patient is seen in follow up today s/p drainage of a left upper quadrant fluid collection on 11/26/15  History of present illness: Cody Suarez is a 71 year old male with history of metastatic colon cancer and prior partial colectomy in 2015 followed by distal pancreatectomy in October 2017 secondary to disease recurrence. He subsequently underwent drainage of a left upper quadrant fluid collection on 11/26/15. Drain fluid cultures at the time grew Staphylococcus aureus. Drain fluid amylase levels were high consistent with pancreatic leak. Due to slight retraction of the drain he underwent exchange on 02/12/16. He presents today for follow-up CT to assess adequacy of drainage. He currently denies fever, headache, chest pain, worsening dyspnea, cough, back pain, nausea, vomiting or abnormal bleeding. He does have some mild tenderness at drain site. He was instructed not to flush the drainage catheter at the last visit. He reports minimal drain output. He was seen by Dr. Hassell Done recently, given lidocaine gel to apply to the drain insertion site but no further surgical plans were made.   Past Medical History:  Diagnosis Date  . Anemia of chronic disease 2015   "related to cancer tx" (08/09/2013)  . Blood clot in vein 2015  . Colon cancer ([place]) 03/2013  . History of kidney stones    x 2passed them both  . Hypertension   . Peripheral vascular disease ([place])   . Ulcerative colitis ([place])    history    Past Surgical History:  Procedure Laterality Date  . ABDOMINAL AORTAGRAM N/A 08/09/2013   Procedure: ABDOMINAL Maxcine Ham;  Surgeon: [name], MD;  Location: [facility] CATH LAB;  Service: Cardiovascular;  Laterality: N/A;  . CHOLECYSTECTOMY N/A 09/08/2014   Procedure: LAPAROSCOPIC CHOLECYSTECTOMY WITH INTRAOPERATIVE CHOLANGIOGRAM;  Surgeon: Armandina Gemma, MD;  Location: [facility];  Service: General;  Laterality: N/A;  . COLON SURGERY  02/2013  .  COLON SURGERY  10/2015  . FEMORAL-POPLITEAL BYPASS GRAFT Right 08/11/2013   Procedure:   RIGHT - POPLITEAL TO PERONEAL ARTERY BYPASS GRAFT  WITH NONREVERSED SAPHENOUS VEIN GRAFT,tHROMBECTOMY ANTERIOR TIBIALIS,ATTEMPTED THROMBECTOMY TIBIO-PERONEAL TRUNK AND POSTERIOR TIBIALIS, INTRAOPERATIVE ARTERIOGRAM.;  Surgeon: Mal Misty, MD;  Location: [facility];  Service: Vascular;  Laterality: Right;  . FEMORAL-TIBIAL BYPASS GRAFT Right 11/17/2013   Procedure: BYPASS GRAFT RIGHT ABOVE KNEE POPLITEAL TO POSTERIOR TIBIAL ARTERY USING RIGHT NON-REVERSED CEPHALIC VEIN;  Surgeon: Mal Misty, MD;  Location: [facility];  Service: Vascular;  Laterality: Right;  . HERNIA REPAIR  ~ 1995; 03/2013   UHR  . INTRAOPERATIVE ARTERIOGRAM Right 11/17/2013   Procedure: INTRA OPERATIVE ARTERIOGRAM;  Surgeon: Mal Misty, MD;  Location[facility] OR;  Service: Vascular;  Laterality: Right;  . IR GENERIC HISTORICAL  12/11/2015   IR RADIOLOGIST EVAL & MGMT 12/11/2015 Arne Cleveland, MD [facility] INTER[facility] RAD  . IR GENERIC HISTORICAL  12/24/2015   IR RADIOLOGIST EVAL & MGMT 12/24/2015 Jacqulynn Cadet, MD [facility]  . IR GENERIC HISTORICAL  01/21/2016   IR RADIOLOGIST EVAL & MGMT 01/21/2016 [name], MD [facility]  . IR GENERIC HISTORICAL  02/04/2016   IR RADIOLOGIST EVAL & MGMT 02/04/2016 Sandi Mariscal, MD [facility] INTER[facility]  . IR GENERIC HISTORICAL  02/12/2016   IR CATHETER TUBE CHANGE 02/12/2016 Corrie Mckusick, DO [facility]  . LAPAROSCOPIC RIGHT HEMI COLECTOMY N/A 03/28/2013   Procedure: LAPAROSCOPIC ASSISTED HEMI COLECTOMY;  Surgeon: Pedro Earls, MD;  Location: [facility];  Service: General;  Laterality: N/A;  . LAPAROSCOPY N/A  11/05/2015   Procedure: LAPAROSCOPY DIAGNOSTIC;  Surgeon: [name], MD;  Location: [facility];  Service: General;  Laterality: N/A;  . LAPAROTOMY N/A 11/05/2015   Procedure: EXPLORATORY LAPAROTOMY, resection of mass at tail of pancreas;  Surgeon: [name], MD;  Location: [facility];  Service: General;   Laterality: N/A;  . LOWER EXTREMITY ANGIOGRAM Bilateral 08/09/2013  . TEE WITHOUT CARDIOVERSION N/A 08/10/2013   Procedure: TRANSESOPHAGEAL ECHOCARDIOGRAM (TEE);  Surgeon: Candee Furbish, MD;  Location: [facility] ENDOSCOPY;  Service: Cardiovascular;  Laterality: N/A;  . TONSILLECTOMY  ~ 1950  . [place]; 03/2013  . VASECTOMY    . VEIN HARVEST Right 11/17/2013   Procedure: HARVEST OF RIGHT UPPER EXTREMITY CEPHALIC VEIN;  Surgeon: Mal Misty, MD;  Location: [place] [facility];  Service: Vascular;  Laterality: Right;    Allergies: Amlodipine and Gabapentin  Medications: Prior to Admission medications   Medication Sig Start Date End Date Taking? Authorizing Provider  budesonide (ENTOCORT EC) 3 MG 24 hr capsule Take 3 mg by mouth 2 (two) times daily.     Historical Provider, MD  carvedilol (COREG) 6.25 MG tablet  12/10/15   Historical Provider, MD  HYDROcodone-acetaminophen (NORCO/VICODIN) 5-325 MG tablet Take 1-2 tablets by mouth every 4 (four) hours as needed for moderate pain. Patient not taking: Reported on 01/21/2016 11/11/15   [name], MD  losartan (COZAAR) 100 MG tablet Take 1 tablet (100 mg total) by mouth daily. Patient taking differently: Take 50 mg by mouth daily with breakfast. Take 1/2 tablet daily. 02/06/15   [name], MD  megestrol (MEGACE) 400 MG/10ML suspension Take 5 mLs (200 mg total) by mouth 2 (two) times daily. 02/13/16   [name], MD  metoCLOPramide (REGLAN) 5 MG tablet Take 1 tablet (5 mg total) by mouth 3 (three) times daily before meals. 01/17/16   [name], MD  Multiple Vitamin (MULTIVITAMIN WITH MINERALS) TABS tablet Take 1 tablet by mouth daily.    Historical Provider, MD  ondansetron (ZOFRAN-ODT) 4 MG disintegrating tablet Take 4 mg by mouth every 8 (eight) hours as needed for nausea or vomiting.  11/15/15   Historical Provider, MD  spironolactone (ALDACTONE) 25 MG tablet TAKE 1 TABLET DAILY Patient not taking: Reported on 02/04/2016 08/12/15    [name], MD  VIAGRA 50 MG tablet Take 50 mg by mouth daily as needed for erectile dysfunction.  08/16/15   Historical Provider, MD  XARELTO 20 MG TABS tablet TAKE 1 TABLET DAILY WITH BREAKFAST 01/02/16   [name], MD  zolpidem (AMBIEN) 5 MG tablet Take 5 mg by mouth at bedtime as needed for sleep.    Historical Provider, MD     Family History  Problem Relation Age of Onset  . Heart disease Mother   . Emphysema Father     Social History   Social History  . Marital status: Married    Spouse name: N/A  . Number of children: N/A  . Years of education: N/A   Social History Main Topics  . Smoking status: Never Smoker  . Smokeless tobacco: Never Used     Comment: states his father died of emphysema from smoking, he did not want to be like that  . Alcohol use 1.8 oz/week    3 Glasses of wine per week     Comment: occasionally  . Drug use: No  . Sexual activity: Yes   Other Topics Concern  . Not on file   Social History  Narrative  . No narrative on file     Vital Signs: BP 132/60 (BP Location: Left Arm, Patient Position: Sitting, Cuff Size: Normal)   Pulse 86   Temp 97.8 F (36.6 C) (Oral)   Resp 15   SpO2 100%   Physical Exam awake, alert. Chest with slightly diminished breath sounds left base, right clear. Heart with regular rate and rhythm. Abdomen soft, positive bowel sounds, left upper quadrant drain intact, insertion site with minimal erythema, mildly tender; small amount of thick beige colored fluid in drain bag  Imaging: No results found.  Labs:  CBC:  Recent Labs  11/27/15 0508 11/28/15 0425 11/29/15 0433 11/30/15 0454  WBC 25.7* 23.6* 18.9* 13.0*  HGB 11.5* 10.1* 10.5* 9.7*  HCT 35.8* 29.8* 32.5* 29.8*  PLT 750* 563* 623* 574*    COAGS:  Recent Labs  10/04/15 0720 11/05/15 0523 11/26/15 1048  INR 1.00 1.04 1.59    BMP:  Recent Labs  11/26/15 0510 11/26/15 0518 11/27/15 0508 11/28/15 0425 11/29/15 0433  NA 134* 135  133* 130* 130*  K 4.2 4.3 5.2* 4.2 3.8  CL 100* 99* 101 99* 99*  CO2 25  --  24 23 24   GLUCOSE 139* 136* 208* 171* 155*  BUN 13 13 11 15 10   CALCIUM 9.0  --  8.2* 7.6* 7.9*  CREATININE 1.24 1.10 1.15 1.58* 1.08  GFRNONAA 57*  --  >60 43* >60  GFRAA >60  --  >60 49* >60    LIVER FUNCTION TESTS:  Recent Labs  10/09/15 1434 11/06/15 0323 11/26/15 0510  BILITOT 0.4 0.8 0.4  AST 18 34 26  ALT 19 26 21   ALKPHOS 42 38 68  PROT 6.8 6.4* 8.0  ALBUMIN 3.9 3.5 3.1*    Assessment:  71 year old male with history of metastatic colon cancer and prior partial colectomy in 2015 followed by distal pancreatectomy in October 2017 secondary to disease recurrence. He subsequently underwent drainage of a left upper quadrant fluid collection on 11/26/15. Drain fluid cultures at the time grew Staphylococcus aureus. Drain fluid amylase levels were high consistent with pancreatic leak. Due to slight retraction of the drain he underwent exchange on 02/12/16. Preliminary findings on follow-up CT abdomen/ pelvis today reveals persistent and probably slightly more fluid around existing left upper quadrant drainage catheter.The drainage catheter was flushed aggressively today with 30 mL sterile normal saline with return of approximately 100 mL of thick reddish- beige fluid. He was instructed to begin flushing catheter 3 times daily with 5 mL sterile normal saline, continue recording output and change dressing every 1-2 days. He was told to contact our service with any additional questions in the interim. Recommend follow-up with Dr. Hassell Done for further definitive plans. He may benefit from referral to a tertiary care center .  Signed: D. Rowe Robert 02/20/2016, 2:26 PM   Please refer to Dr. Moises Blood attestation of this note for management and plan.      Patient ID: Cody Suarez, male   DOB: 1945-10-29, 71 y.o.   MRN: [id number]

## 2016-02-25 ENCOUNTER — Other Ambulatory Visit: Payer: Self-pay | Admitting: Surgery

## 2016-02-25 DIAGNOSIS — K651 Peritoneal abscess: Secondary | ICD-10-CM

## 2016-03-02 ENCOUNTER — Other Ambulatory Visit: Payer: Self-pay

## 2016-03-02 DIAGNOSIS — I10 Essential (primary) hypertension: Secondary | ICD-10-CM

## 2016-03-02 MED ORDER — LOSARTAN POTASSIUM 100 MG PO TABS: 100.0000 mg | ORAL_TABLET | Freq: Every day | ORAL | 3 refills | Status: DC

## 2016-03-02 NOTE — Telephone Encounter (Signed)
Requested Prescriptions   Signed Prescriptions Disp Refills  . losartan (COZAAR) 100 MG tablet 90 tablet 3    Sig: Take 1 tablet (100 mg total) by mouth daily.    Authorizing Provider: Kathlyn Sacramento A    Ordering User: Janan Ridge

## 2016-03-04 ENCOUNTER — Other Ambulatory Visit: Payer: 59

## 2016-03-13 ENCOUNTER — Other Ambulatory Visit: Payer: Self-pay | Admitting: *Deleted

## 2016-03-13 MED ORDER — MEGESTROL ACETATE 400 MG/10ML PO SUSP: 200.0000 mg | Freq: Two times a day (BID) | ORAL | 0 refills | Status: DC

## 2016-03-17 ENCOUNTER — Telehealth: Payer: Self-pay | Admitting: Oncology

## 2016-03-17 NOTE — Telephone Encounter (Signed)
Pt returned call, appt given.

## 2016-03-17 NOTE — Telephone Encounter (Signed)
lvm to inform pt of 3/27 appt at 11 am per LOS

## 2016-03-31 ENCOUNTER — Ambulatory Visit (HOSPITAL_BASED_OUTPATIENT_CLINIC_OR_DEPARTMENT_OTHER): Payer: 59 | Admitting: Oncology

## 2016-03-31 ENCOUNTER — Telehealth: Payer: Self-pay | Admitting: Oncology

## 2016-03-31 ENCOUNTER — Encounter (INDEPENDENT_AMBULATORY_CARE_PROVIDER_SITE_OTHER): Payer: Self-pay

## 2016-03-31 VITALS — BP 128/71 | HR 100 | Temp 98.3°F | Resp 18 | Ht 70.0 in | Wt 153.8 lb

## 2016-03-31 DIAGNOSIS — I10 Essential (primary) hypertension: Secondary | ICD-10-CM

## 2016-03-31 DIAGNOSIS — C184 Malignant neoplasm of transverse colon: Secondary | ICD-10-CM

## 2016-03-31 NOTE — Progress Notes (Signed)
[facility] OFFICE PROGRESS NOTE   Diagnosis: Colon cancer  INTERVAL HISTORY:   Cody Suarez returns as scheduled. He reports Dr. Hassell Done removed the abdominal drain several weeks ago. He felt much better within days. He now has a good appetite. His energy level is improved. He is exercising. Minimal drainage from the left upper quadrant tube site. He continues Megace.  Objective:  Vital signs in last 24 hours:  Blood pressure 128/71, pulse 100, temperature 98.3 F (36.8 C), temperature source Oral, resp. rate 18, height 5' 10"  (1.778 m), weight 153 lb 12.8 oz (69.8 kg), SpO2 100 %.    Lymphatics: No cervical, supraclavicular, axillary, or inguinal nodes. Prominent right axillary fat pad. Resp: Lungs clear bilaterally Cardio: Regular rate and rhythm GI: No hepatosplenomegaly, no mass, left upper quadrant drain site with a less than 1 cm superficial opening. No drainage. Vascular: No leg edema    Medications: I have reviewed the patient's current medications.  Assessment/Plan: 1. 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 Negative for a BRAF mutation  Tumor invaded through the muscularis propria into pericolonic fatty tissue and involved the attached omentum.   Cycle 1 adjuvant Xeloda 05/28/2013.   Cycle 2 adjuvant Xeloda 06/18/2013.   Cycle 3 adjuvant Xeloda 07/09/2013.   Cycle 4 adjuvant Xeloda 07/30/2013.   Cycle 5 adjuvant Xeloda 08/20/2013.  Cycle 6 adjuvant Xeloda 09/11/2013.  cycle 7 adjuvant Xeloda 10/02/2013  Cycle 8 adjuvant Xeloda 10/23/2013  CTs of the chest, abdomen, and pelvis on 07/18/2014-negative for recurrent colon cancer   CTs abdomen/pelvis 09/06/2014 showed acute cholecystitis.  Colonoscopy 08/02/2015-chronic colitis, no malignancy  CTs  09/17/2015, new mass near the tail the pancreas, stable lung nodules  Biopsy mesenteric mass 10/04/2015 with metastatic adenocarcinoma consistent with colorectal primary  PET scan 10/10/2015 with soft tissue thickening within the peritoneal space of the left upper quadrant with SUV Max 7.9; no additional sites of peritoneal nodularity to suggest metastasis; hypermetabolic ill-defined nodule in the left lower lobe favored inflammatory.  Resection of the left abdominal mesenteric mass/tail the pancreas on 11/05/2015 with the pathology confirming metastatic colon cancer, positive "serosal" margin 2. Ulcerative colitis-extensive chronic active ulcerative colitis was noted on the colon resection specimen 03/28/2013.  Followed by Dr.Magod 3. Hypertension.  4. History of microcytic anemia-likely iron deficiency, unable to tolerate oral iron. Improved. 5. Possible area of cecal wall thickening noted on abdominal CT 03/20/2013. 6. Family history of colon cancer (maternal grandfather died in his 73s with colon cancer). 7. Bilateral calf and low anterior leg pain. Bilateral lower extremity venous duplex negative for DVT 07/14/2013. Right ABI with moderate, borderline severe arterial insufficiency; left ABI suggestive of moderate arterial insufficiency. He was referred to vascular. 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 TEE showed thrombus in the descending aorta. He underwent right popliteal to peroneal artery bypass graft, thrombectomy anterior tibialis and attempted thrombectomy tibioperoneal trunk and posterior tibialis on 08/11/2013. Right calf pain 09/26/2013 with findings of occlusion of right popliteal to peroneal bypass status post thrombolysis. Now on anticoagulation with Xarelto.  recurrent pain and ulceration at the right foot, status post a popliteal to posterior tibial artery bypass using a cephalic vein graft on 09/81/1914 8. Status post laparoscopic cholecystectomy  09/08/2014 9. Anorexia following the mesenteric mass/pancreas tail resection-improved 10. Abdominal abscess November 2017-pancreas "cysts "drainage catheter placed 11/26/2015; CT 02/04/2016 with no  change to slight decrease in size of residual pancreatic tail fluid collection. Drainage catheter removed approximately early March 2018  CT 02/20/2016-new small left pleural effusion, increased size of the surgical bed fluid collection, indeterminate pulmonary nodules   Disposition:  Cody Suarez has a much improved performance status today. He will discontinue the Megace after the current prescription. He will contact us if he develops recurrent anorexia.  He will return for an office visit and CEA in 3 months.  15 minutes were spent with the patient today. The majority of the time was used for counseling and coordination of care.  Betsy Coder, MD  03/31/2016  12:34 PM

## 2016-03-31 NOTE — Telephone Encounter (Signed)
Gave patient AVS and calender per 03/31/2016 los.

## 2016-04-11 ENCOUNTER — Other Ambulatory Visit: Payer: Self-pay | Admitting: Nurse Practitioner

## 2016-04-13 ENCOUNTER — Other Ambulatory Visit: Payer: Self-pay | Admitting: *Deleted

## 2016-04-13 NOTE — Telephone Encounter (Signed)
Medication request through e-scribe for Megace. Per Benay Spice, MD note on 03/31/16 will discontinue Megace after the current prescription. Notified Juliann Pulse at [employer] in [place] of this, she verbalized understanding.

## 2016-04-29 ENCOUNTER — Encounter: Payer: Self-pay | Admitting: Vascular Surgery

## 2016-05-06 ENCOUNTER — Encounter: Payer: Self-pay | Admitting: Family

## 2016-05-06 ENCOUNTER — Ambulatory Visit (INDEPENDENT_AMBULATORY_CARE_PROVIDER_SITE_OTHER): Payer: 59 | Admitting: Family

## 2016-05-06 ENCOUNTER — Ambulatory Visit (HOSPITAL_COMMUNITY)
Admission: RE | Admit: 2016-05-06 | Discharge: 2016-05-06 | Disposition: A | Discharge status: Home / Self Care | Payer: 59 | Source: Ambulatory Visit | Attending: Family | Admitting: Family

## 2016-05-06 VITALS — BP 133/74 | HR 64 | Temp 97.3°F | Resp 18 | Ht 70.0 in | Wt 165.0 lb

## 2016-05-06 DIAGNOSIS — I779 Disorder of arteries and arterioles, unspecified: Secondary | ICD-10-CM | POA: Diagnosis present

## 2016-05-06 NOTE — Patient Instructions (Signed)

## 2016-05-06 NOTE — Progress Notes (Signed)
[facility] [place]   CC: Follow up peripheral artery occlusive disease  History of Present Illness Cody Suarez is a 71 y.o. male whom Dr. Kellie Simmering has been following, returns for continued follow-up regarding his lower extremity arterial occlusive disease.   He has a history of colon cancer which was treated surgically in 2015 and he is followed by Dr. Julieanne Manson in the cancer clinic. He had recurrence of cancer, of the splenic flexure, and had a laparotomy on 11-05-15 for this. He then developed an abscess near his pancreas and had a drain connected, this had been addressed, the drain has been removed. He feels much improved since the drain was removed January 2018, and is more active.   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   He had 2 failed attempts at revascularization of the right leg due to his small diseased tibial vessels but fortunately he has maintained limb salvage. He denies any rest pain or nonhealing ulcers. There is no numbness in the feet. He does have claudication in both calves right worse than left if he ambulates quickly.  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  Walking on pavement his right knee hurts, denies calf or thigh sx's with walking.   He denies any history of stroke or TIA.   Pt Diabetic: No Pt smoker: non-smoker  Pt meds include: Statin :No, states his cholesterol is good Betablocker: No ASA: No Other anticoagulants/antiplatelets: Xarelto     Past Medical History:  Diagnosis Date  . Anemia of chronic disease 2015   "related to cancer tx" (08/09/2013)  . Blood clot in vein 2015  . Colon cancer ([place]) 03/2013  . History of kidney stones    x 2passed them both  . Hypertension   . Peripheral vascular disease ([place])    . Ulcerative colitis ([place])    history    Social History Social History  Substance Use Topics  . Smoking status: Never Smoker  . Smokeless tobacco: Never Used     Comment: states his father died of emphysema from smoking, he did not want to be like that  . Alcohol use 1.8 oz/week    3 Glasses of wine per week     Comment: occasionally    Family History Family History  Problem Relation Age of Onset  . Heart disease Mother   . Emphysema Father     Past Surgical History:  Procedure Laterality Date  . ABDOMINAL AORTAGRAM N/A 08/09/2013   Procedure: ABDOMINAL Maxcine Ham;  Surgeon: [name], MD;  Location: [facility];  Service: Cardiovascular;  Laterality: N/A;  . CHOLECYSTECTOMY N/A 09/08/2014   Procedure: LAPAROSCOPIC CHOLECYSTECTOMY WITH INTRAOPERATIVE CHOLANGIOGRAM;  Surgeon: Armandina Gemma, MD;  Location: [facility];  Service: General;  Laterality: N/A;  . COLON SURGERY  02/2013  . COLON SURGERY  10/2015  . FEMORAL-POPLITEAL BYPASS GRAFT Right 08/11/2013   Procedure:   RIGHT - POPLITEAL TO PERONEAL ARTERY BYPASS GRAFT  WITH NONREVERSED SAPHENOUS VEIN GRAFT,tHROMBECTOMY ANTERIOR TIBIALIS,ATTEMPTED THROMBECTOMY TIBIO-PERONEAL TRUNK AND POSTERIOR TIBIALIS, INTRAOPERATIVE ARTERIOGRAM.;  Surgeon: Mal Misty, MD;  Location: [facility];  Service: Vascular;  Laterality: Right;  . FEMORAL-TIBIAL BYPASS GRAFT Right 11/17/2013   Procedure: BYPASS GRAFT RIGHT ABOVE KNEE POPLITEAL TO POSTERIOR TIBIAL ARTERY USING RIGHT NON-REVERSED  CEPHALIC VEIN;  Surgeon: Mal Misty, MD;  Location: [facility];  Service: Vascular;  Laterality: Right;  . HERNIA REPAIR  ~ 1995; 03/2013   UHR  . INTRAOPERATIVE ARTERIOGRAM Right 11/17/2013   Procedure: INTRA OPERATIVE ARTERIOGRAM;  Surgeon: Mal Misty, MD;  Location: [facility] OR;  Service: Vascular;  Laterality: Right;  . IR GENERIC HISTORICAL  12/11/2015   IR RADIOLOGIST EVAL & MGMT 12/11/2015 Arne Cleveland, MD [facility]  . IR GENERIC HISTORICAL  12/24/2015    IR RADIOLOGIST EVAL & MGMT 12/24/2015 Jacqulynn Cadet, MD [facility]  . IR GENERIC HISTORICAL  01/21/2016   IR RADIOLOGIST EVAL & MGMT 01/21/2016 [name], MD [facility]  . IR GENERIC HISTORICAL  02/04/2016   IR RADIOLOGIST EVAL & MGMT 02/04/2016 Sandi Mariscal, MD [facility]  . IR GENERIC HISTORICAL  02/12/2016   IR CATHETER TUBE CHANGE 02/12/2016 Corrie Mckusick, DO [facility]  . IR GENERIC HISTORICAL  02/20/2016   IR RADIOLOGIST EVAL & MGMT 02/20/2016 Markus Daft, MD [facility]  . LAPAROSCOPIC RIGHT HEMI COLECTOMY N/A 03/28/2013   Procedure: LAPAROSCOPIC ASSISTED HEMI COLECTOMY;  Surgeon: Pedro Earls, MD;  Location[facility];  Service: General;  Laterality: N/A;  . LAPAROSCOPY N/A 11/05/2015   Procedure: LAPAROSCOPY DIAGNOSTIC;  Surgeon: [name], MD;  Location: [facility];  Service: General;  Laterality: N/A;  . LAPAROTOMY N/A 11/05/2015   Procedure: EXPLORATORY LAPAROTOMY, resection of mass at tail of pancreas;  Surgeon: [name], MD;  Location: [facility];  Service: General;  Laterality: N/A;  . LOWER EXTREMITY ANGIOGRAM Bilateral 08/09/2013  . TEE WITHOUT CARDIOVERSION N/A 08/10/2013   Procedure: TRANSESOPHAGEAL ECHOCARDIOGRAM (TEE);  Surgeon: Candee Furbish, MD;  Location: [facility]OSCOPY;  Service: Cardiovascular;  Laterality: N/A;  . TONSILLECTOMY  ~ 1950  . [place]; 03/2013  . VASECTOMY    . VEIN HARVEST Right 11/17/2013   Procedure: HARVEST OF RIGHT UPPER EXTREMITY CEPHALIC VEIN;  Surgeon: Mal Misty, MD;  Location: [facility];  Service: Vascular;  Laterality: Right;    Allergies  Allergen Reactions  . Amlodipine Swelling    Right LE edema  . Gabapentin Rash    Short-term memory decrease    Current Outpatient Prescriptions  Medication Sig Dispense Refill  . acetaminophen (TYLENOL) 500 MG tablet Take 500 mg by mouth every 6 (six) hours as needed.    . budesonide (ENTOCORT EC) 3 MG 24 hr capsule Take 3 mg by mouth 2 (two) times daily.      Marland Kitchen lidocaine (XYLOCAINE) 2 % jelly APPLY TO AFFECTED AREA 3 TIMES A DAY AS NEEDED  1  . losartan (COZAAR) 100 MG tablet Take 1 tablet (100 mg total) by mouth daily. 90 tablet 3  . Multiple Vitamin (MULTIVITAMIN WITH MINERALS) TABS tablet Take 1 tablet by mouth daily.    Marland Kitchen spironolactone (ALDACTONE) 25 MG tablet TAKE 1 TABLET DAILY 30 tablet 11  . VIAGRA 50 MG tablet Take 50 mg by mouth daily as needed for erectile dysfunction.   5  . XARELTO 20 MG TABS tablet TAKE 1 TABLET DAILY WITH BREAKFAST 30 tablet 9  . carvedilol (COREG) 6.25 MG tablet     . HYDROcodone-acetaminophen (NORCO/VICODIN) 5-325 MG tablet Take 1-2 tablets by mouth every 4 (four) hours as needed for moderate pain. (Patient not taking: Reported on 05/06/2016) 30 tablet 0  . megestrol (MEGACE) 400 MG/10ML suspension Take 5 mLs (200 mg total) by mouth 2 (two) times  daily. (Patient not taking: Reported on 05/06/2016) 300 mL 0  . metoCLOPramide (REGLAN) 5 MG tablet Take 1 tablet (5 mg total) by mouth 3 (three) times daily before meals. (Patient not taking: Reported on 05/06/2016) 90 tablet 0  . ondansetron (ZOFRAN-ODT) 4 MG disintegrating tablet Take 4 mg by mouth every 8 (eight) hours as needed for nausea or vomiting.     . sodium chloride flush 0.9 % SOLN injection   0  . zolpidem (AMBIEN) 10 MG tablet TAKE 1/2 TO 1 TABLET AT BEDTIME AS NEEDED FOR SLEEP  0  . zolpidem (AMBIEN) 5 MG tablet Take 5 mg by mouth at bedtime as needed for sleep.     Current Facility-Administered Medications  Medication Dose Route Frequency Provider Last Rate Last Dose  . sodium chloride 0.9 % injection 10 mL  10 mL Intracatheter TID Markus Daft, MD        ROS: See HPI for pertinent positives and negatives.   Physical Examination  Vitals:   05/06/16 1348 05/06/16 1353  BP: (!) 142/71 133/74  Pulse: 64 64  Resp: 18   Temp: 97.3 F (36.3 C)   SpO2: 97%   Weight: 165 lb (74.8 kg)   Height: 5' 10"  (1.778 m)    Body mass index is 23.68 kg/m.  General:  A&O x 3, WDWN, male. Gait: slow, steady Eyes: PERRLA. Pulmonary: Respirations are non labored, CTAB, good air movement in all fields. Cardiac: regular rhythm, no detected murmur.         Carotid Bruits Right Left   Negative Negative  Aorta is not palpable. Radial pulses: 2+ palpable and =                           VASCULAR EXAM: Extremities without ischemic changes, without Gangrene; without open wounds.                                                                                                                                                       LE Pulses Right Left       FEMORAL  2+ palpable  2+ palpable        POPLITEAL  not palpable   not palpable       POSTERIOR TIBIAL  not palpable   not palpable        DORSALIS PEDIS      ANTERIOR TIBIAL not palpable  1+ palpable    Abdomen: soft, NT, no palpable masses.  Skin: no rashes, no ulcers noted. Musculoskeletal: no muscle wasting or atrophy.         Neurologic: A&O X 3; Appropriate Affect ; SENSATION: normal; MOTOR FUNCTION:  moving all extremities equally, motor strength 5/5 throughout. Speech is fluent/normal. CN 2-12 intact.     Non-Invasive Vascular Imaging: DATE:  05-06-16 ABI: RIGHT: 0.0.51 (was 0.57 on 02-06-16), Waveforms: monophasic; TBI: 0.20 (was 0.25).  LEFT: 0.59 (was 0.69), Waveforms:  monophasic; TBI: 0.38 (was 0.45) Bilateral ABI's and TBI's declined slightly, moderate bilateral arterial occlusive disease in both lower extremities.    ASSESSMENT: KOLTON KIENLE is a 71 y.o. male who is s/p failed right popliteal-peroneal artery bypass graft on 08-09-13 and failed right popliteal to posterior tibial arterial bypass graft on 11-17-13. This was due to his small diseased tibial vessels but fortunately he has maintained limb salvage.  He was walking 8 hours/day on his job until he developed recurrent colon cancer in October 2017, had several major setbacks, with significant weight loss and weakness.   His walking is now limited by his right knee pain, he feels improved since his left flank drain was removed.  He has no signs of ischemia in his feet/legs.  Pt states he does not want any more surgery, as he has been through so much lately.   PLAN:  Gradual resumption of graduated walking program. Daily seated leg exercises discussed and demonstrated.  Based on the patient's vascular studies and examination, pt will return to clinic in 3 months with ABI's, see Dr. Donzetta Matters afterward, pt can return to me for continued follow up visits after Dr. Donzetta Matters evaluates him. I advised pt and wife to notify us if he develops concerns re the circulation in his feet/legs.   I discussed in depth with the patient the nature of atherosclerosis, and emphasized the importance of maximal medical management including strict control of blood pressure, blood glucose, and lipid levels, obtaining regular exercise, and continued cessation of smoking.  The patient is aware that without maximal medical management the underlying atherosclerotic disease process will progress, limiting the benefit of any interventions.  The patient was given information about PAD including signs, symptoms, treatment, what symptoms should prompt the patient to seek immediate medical care, and risk reduction measures to take.  Cody Chambers, RN, MSN, FNP-C [facility] Phone: [phone]  Clinic MD: Scot Dock  05/06/16 1:55 PM

## 2016-05-07 NOTE — Addendum Note (Signed)
Addended by: Lianne Cure A on: 05/07/2016 09:23 AM   Modules accepted: Orders

## 2016-07-01 ENCOUNTER — Ambulatory Visit: Payer: 59

## 2016-07-01 ENCOUNTER — Telehealth: Payer: Self-pay | Admitting: Nurse Practitioner

## 2016-07-01 ENCOUNTER — Other Ambulatory Visit: Payer: 59

## 2016-07-01 ENCOUNTER — Ambulatory Visit (HOSPITAL_BASED_OUTPATIENT_CLINIC_OR_DEPARTMENT_OTHER): Payer: 59 | Admitting: Nurse Practitioner

## 2016-07-01 VITALS — BP 146/69 | HR 76 | Temp 97.9°F | Resp 18 | Ht 70.0 in | Wt 176.1 lb

## 2016-07-01 DIAGNOSIS — C184 Malignant neoplasm of transverse colon: Secondary | ICD-10-CM | POA: Diagnosis not present

## 2016-07-01 DIAGNOSIS — R918 Other nonspecific abnormal finding of lung field: Secondary | ICD-10-CM | POA: Diagnosis not present

## 2016-07-01 DIAGNOSIS — R197 Diarrhea, unspecified: Secondary | ICD-10-CM

## 2016-07-01 DIAGNOSIS — I10 Essential (primary) hypertension: Secondary | ICD-10-CM | POA: Diagnosis not present

## 2016-07-01 DIAGNOSIS — C7889 Secondary malignant neoplasm of other digestive organs: Secondary | ICD-10-CM

## 2016-07-01 LAB — CEA (IN HOUSE-CHCC): CEA (CHCC-In House): 2.24 ng/mL (ref 0.00–5.00)

## 2016-07-01 NOTE — Progress Notes (Signed)
[facility] OFFICE PROGRESS NOTE   Diagnosis:  Colon cancer  INTERVAL HISTORY:   Mr. Wolfinger returns as scheduled. He overall is feeling well. He began having diarrhea 2-3 weeks ago, 3+ times a day. He has tried Imodium with no improvement. He reports a history of ulcerative colitis with similar symptoms in the past. He occasionally notes bright red blood, mostly following a bowel movement. He denies abdominal pain. No nausea or vomiting. He has a good appetite.  Objective:  Vital signs in last 24 hours:  Blood pressure (!) 146/69, pulse 76, temperature 97.9 F (36.6 C), temperature source Oral, resp. rate 18, height _0  (1.778 m), weight 176 lb 1.6 oz (79.9 kg), SpO2 100 %.    HEENT: Neck without mass. Lymphatics: No palpable cervical, supraclavicular, axillary or inguinal lymph nodes. Resp: Lungs clear bilaterally. Cardio: Regular rate and rhythm. GI: Abdomen soft and nontender. No hepatomegaly. Vascular: Trace edema at the lower legs bilaterally.   Lab Results:  Lab Results  Component Value Date   WBC 13.0 (H) 11/30/2015   HGB 9.7 (L) 11/30/2015   HCT 29.8 (L) 11/30/2015   MCV 87.6 11/30/2015   PLT 574 (H) 11/30/2015   NEUTROABS 18.4 (H) 11/26/2015    Imaging:  No results found.  Medications: I have reviewed the patient's current medications.  Assessment/Plan: 1. 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 Negative for a BRAF mutation  Tumor invaded through the muscularis propria into pericolonic fatty tissue and involved the attached omentum.   Cycle 1 adjuvant Xeloda 05/28/2013.   Cycle 2 adjuvant Xeloda 06/18/2013.   Cycle 3 adjuvant Xeloda 07/09/2013.   Cycle 4 adjuvant Xeloda 07/30/2013.   Cycle 5 adjuvant Xeloda 08/20/2013.  Cycle 6 adjuvant Xeloda  09/11/2013.  cycle 7 adjuvant Xeloda 10/02/2013  Cycle 8 adjuvant Xeloda 10/23/2013  CTs of the chest, abdomen, and pelvis on 07/18/2014-negative for recurrent colon cancer   CTs abdomen/pelvis 09/06/2014 showed acute cholecystitis.  Colonoscopy 08/02/2015-chronic colitis, no malignancy  CTs 09/17/2015, new mass near the tail the pancreas, stable lung nodules  Biopsy mesenteric mass 10/04/2015 with metastatic adenocarcinoma consistent with colorectal primary  PET scan 10/10/2015 with soft tissue thickening within the peritoneal space of the left upper quadrant with SUV Max 7.9; no additional sites of peritoneal nodularity to suggest metastasis; hypermetabolic ill-defined nodule in the left lower lobe favored inflammatory.  Resection of the left abdominal mesenteric mass/tail the pancreas on 11/05/2015 with the pathology confirming metastatic colon cancer, positive "serosal" margin 2. Ulcerative colitis-extensive chronic active ulcerative colitis was noted on the colon resection specimen 03/28/2013.  Followed by Dr.Magod 3. Hypertension.  4. History of microcytic anemia-likely iron deficiency, unable to tolerate oral iron. Improved. 5. Possible area of cecal wall thickening noted on abdominal CT 03/20/2013. 6. Family history of colon cancer (maternal grandfather died in his 41s with colon cancer). 7. Bilateral calf and low anterior leg pain. Bilateral lower extremity venous duplex negative for DVT 07/14/2013. Right ABI with moderate, borderline severe arterial insufficiency; left ABI suggestive of moderate arterial insufficiency. He was referred to vascular. 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 TEE showed thrombus in the descending aorta. He underwent right popliteal to peroneal artery bypass graft, thrombectomy anterior tibialis and attempted thrombectomy tibioperoneal trunk and posterior tibialis on 08/11/2013. Right calf pain 09/26/2013 with findings of  occlusion of right popliteal to  peroneal bypass status post thrombolysis. Now on anticoagulation with Xarelto.  recurrent pain and ulceration at the right foot, status post a popliteal to posterior tibial artery bypass using a cephalic vein graft on 77/11/6577 8. Status post laparoscopic cholecystectomy 09/08/2014 9. Anorexia following the mesenteric mass/pancreas tail resection-improved 10. Abdominal abscess November 2017-pancreas "cysts "drainage catheter placed 11/26/2015; CT 02/04/2016 with no change to slight decreasein size of residual pancreatic tail fluid collection. Drainage catheter removed approximately early March 2018  CT 02/20/2016-new small left pleural effusion, increased size of the surgical bed fluid collection, indeterminate pulmonary nodules   Disposition: Mr. Jarrells appears stable. We will follow-up on the CEA from today. Dr. Benay Spice recommends restaging CT scans in approximately 1 month.  With regard to the diarrhea he will return a stool sample for C. difficile testing. If negative he will follow-up with Dr. Watt Climes due to his history of ulcerative colitis.  He will return for a follow-up visit in one month to review the results of the CT scan. He will contact the office in the interim with any problems.  Plan reviewed with Dr. Benay Spice.    Ned Card ANP/GNP-BC   07/01/2016  12:30 PM

## 2016-07-01 NOTE — Telephone Encounter (Signed)
Scheduled appt per 6/27 los - Gave patient AVS and calender per los. Marium at [place] scheduled ct.

## 2016-07-02 ENCOUNTER — Other Ambulatory Visit: Payer: Self-pay | Admitting: *Deleted

## 2016-07-02 DIAGNOSIS — C7889 Secondary malignant neoplasm of other digestive organs: Secondary | ICD-10-CM

## 2016-07-02 LAB — CEA: CEA1: 2.7 ng/mL (ref 0.0–4.7)

## 2016-07-06 ENCOUNTER — Telehealth: Payer: Self-pay | Admitting: *Deleted

## 2016-07-06 NOTE — Telephone Encounter (Signed)
-----   Message from Owens Shark, NP sent at 07/03/2016  9:00 AM EDT ----- Please let him know CEA is normal.

## 2016-07-06 NOTE — Telephone Encounter (Signed)
Telephone call to patient to advise lab results. Patient verbalized an understanding and was grateful for the call.

## 2016-07-21 ENCOUNTER — Ambulatory Visit (INDEPENDENT_AMBULATORY_CARE_PROVIDER_SITE_OTHER): Payer: 59 | Admitting: Cardiovascular Disease

## 2016-07-21 ENCOUNTER — Encounter: Payer: Self-pay | Admitting: Cardiovascular Disease

## 2016-07-21 VITALS — BP 133/71 | HR 93 | Ht 70.0 in | Wt 175.8 lb

## 2016-07-21 DIAGNOSIS — I10 Essential (primary) hypertension: Secondary | ICD-10-CM

## 2016-07-21 DIAGNOSIS — Z79899 Other long term (current) drug therapy: Secondary | ICD-10-CM | POA: Diagnosis not present

## 2016-07-21 DIAGNOSIS — I739 Peripheral vascular disease, unspecified: Secondary | ICD-10-CM

## 2016-07-21 DIAGNOSIS — E785 Hyperlipidemia, unspecified: Secondary | ICD-10-CM | POA: Diagnosis not present

## 2016-07-21 MED ORDER — ROSUVASTATIN CALCIUM 10 MG PO TABS: 10.0000 mg | ORAL_TABLET | Freq: Every day | ORAL | 3 refills | Status: DC

## 2016-07-21 NOTE — Patient Instructions (Addendum)
Medication Instructions:  START- Crestor 10 mg daily   Labwork: Fasting Lipids Liver and BMP in 6 weeks  Testing/Procedures: None Ordered  Follow-Up: Your physician wants you to follow-up in: 1 Year. You will receive a reminder letter in the mail two months in advance. If you don't receive a letter, please call our office to schedule the follow-up appointment.   Any Other Special Instructions Will Be Listed Below (If Applicable).   If you need a refill on your cardiac medications before your next appointment, please call your pharmacy.

## 2016-07-21 NOTE — Progress Notes (Signed)
Cardiology Office Note   Date:  07/21/2016   ID:  Cody Suarez, DOB 1945/10/26, MRN [id number]  PCP:  Midge Minium, MD  Cardiologist:   Kathlyn Sacramento, MD   Chief Complaint  Patient presents with  . Follow-up      History of Present Illness: Cody Suarez is a 71 y.o. male who presents a follow-up visit regarding  peripheral arterial disease and hypertension.  He was diagnosed with stage II colon cancer early 2015 and underwent partial colectomy in February followed by chemotherapy with Xeloda . He was seen in July 2015 for bilateral calf discomfort which started about 2-3 months prior to evaluation. He underwent lower extremity venous duplex which showed no evidence of DVT. ABI was moderately to severely reduced bilaterally at 0.52 on the right and 0.6 on the left. Angiography was done in August 2015 which showed: 1. No significant aortoiliac disease. 2. 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 One-vessel runoff below the knee bilaterally via the peroneal artery which is occluded proximally but does reconstitute via collaterals. TEE was performed to evaluate source of embolism. It showed normal LV systolic function and wall motion. There was a mobile thrombus in the proximal ascending aorta on top of an ulcerated plaque. CTA showed an ulcerated plaque in the proximal descending aorta with thrombus. She was evaluated by Dr. Trula Slade. Anticoagulation was recommended and he has been on Xarelto since then. He underwent right SFA to posterior tibial bypass by Dr. Kellie Simmering multiple times with poor outcome due to poor runoff.  He has been doing well overall with no chest pain or shortness of breath. He denies claudication at the present time. He had recurrent colon cancer last year that required resection. He is in remission at the present time.   Past Medical History:  Diagnosis Date  . Anemia of chronic disease  2015   "related to cancer tx" (08/09/2013)  . Blood clot in vein 2015  . Colon cancer ([place]) 03/2013  . History of kidney stones    x 2passed them both  . Hypertension   . Peripheral vascular disease ([place])   . Ulcerative colitis ([place])    history    Past Surgical History:  Procedure Laterality Date  . ABDOMINAL AORTAGRAM N/A 08/09/2013   Procedure: ABDOMINAL Maxcine Ham;  Surgeon: [name], MD;  Location: [facility]ATH [facility];  Service: Cardiovascular;  Laterality: N/A;  . CHOLECYSTECTOMY N/A 09/08/2014   Procedure: LAPAROSCOPIC CHOLECYSTECTOMY WITH INTRAOPERATIVE CHOLANGIOGRAM;  Surgeon: Armandina Gemma, MD;  [facility];  Service: General;  Laterality: N/A;  . COLON SURGERY  02/2013  . COLON SURGERY  10/2015  . FEMORAL-POPLITEAL BYPASS GRAFT Right 08/11/2013   Procedure:   RIGHT - POPLITEAL TO PERONEAL ARTERY BYPASS GRAFT  WITH NONREVERSED SAPHENOUS VEIN GRAFT,tHROMBECTOMY ANTERIOR TIBIALIS,ATTEMPTED THROMBECTOMY TIBIO-PERONEAL TRUNK AND POSTERIOR TIBIALIS, INTRAOPERATIVE ARTERIOGRAM.;  Surgeon: Mal Misty, MD;  Location: [facility];  Service: Vascular;  Laterality: Right;  . FEMORAL-TIBIAL BYPASS GRAFT Right 11/17/2013   Procedure: BYPASS GRAFT RIGHT ABOVE KNEE POPLITEAL TO POSTERIOR TIBIAL ARTERY USING RIGHT NON-REVERSED CEPHALIC VEIN;  Surgeon: Mal Misty, MD;  Location: [facility];  Service: Vascular;  Laterality: Right;  . HERNIA REPAIR  ~ 1995; 03/2013   UHR  . INTRAOPERATIVE ARTERIOGRAM Right 11/17/2013   Procedure: INTRA OPERATIVE ARTERIOGRAM;  Surgeon: Mal Misty, MD;  Location: [facility];  Service: Vascular;  Laterality: Right;  . IR GENERIC  HISTORICAL  12/11/2015   IR RADIOLOGIST EVAL & MGMT 12/11/2015 Arne Cleveland, MD [facility]  . IR GENERIC HISTORICAL  12/24/2015   IR RADIOLOGIST EVAL & MGMT 12/24/2015 Jacqulynn Cadet, MD [facility]  . IR GENERIC HISTORICAL  01/21/2016   IR RADIOLOGIST EVAL & MGMT 01/21/2016 [name], MD [facility]  . IR GENERIC HISTORICAL   02/04/2016   IR RADIOLOGIST EVAL & MGMT 02/04/2016 Sandi Mariscal, MD [facility]  . IR GENERIC HISTORICAL  02/12/2016   IR CATHETER TUBE CHANGE 02/12/2016 Corrie Mckusick, DO [facility]  . IR GENERIC HISTORICAL  02/20/2016   IR RADIOLOGIST EVAL & MGMT 02/20/2016 Markus Daft, MD [facility]  . LAPAROSCOPIC RIGHT HEMI COLECTOMY N/A 03/28/2013   Procedure: LAPAROSCOPIC ASSISTED HEMI COLECTOMY;  Surgeon: Pedro Earls, MD;  [facility];  Service: General;  Laterality: N/A;  . LAPAROSCOPY N/A 11/05/2015   Procedure: LAPAROSCOPY DIAGNOSTIC;  Surgeon: [name], MD;  [facility];  Service: General;  Laterality: N/A;  . LAPAROTOMY N/A 11/05/2015   Procedure: EXPLORATORY LAPAROTOMY, resection of mass at tail of pancreas;  Surgeon: [name], MD;  [facility];  Service: General;  Laterality: N/A;  . LOWER EXTREMITY ANGIOGRAM Bilateral 08/09/2013  . TEE WITHOUT CARDIOVERSION N/A 08/10/2013   Procedure: TRANSESOPHAGEAL ECHOCARDIOGRAM (TEE);  Surgeon: Candee Furbish, MD;  Location: [facility] ENDOSCOPY;  Service: Cardiovascular;  Laterality: N/A;  . TONSILLECTOMY  ~ 1950  . [place]; 03/2013  . VASECTOMY    . VEIN HARVEST Right 11/17/2013   Procedure: HARVEST OF RIGHT UPPER EXTREMITY CEPHALIC VEIN;  Surgeon: Mal Misty, MD;  Location: [facility];  Service: Vascular;  Laterality: Right;     Current Outpatient Prescriptions  Medication Sig Dispense Refill  . acetaminophen (TYLENOL) 500 MG tablet Take 500 mg by mouth every 6 (six) hours as needed.    . budesonide (ENTOCORT EC) 3 MG 24 hr capsule Take 3 mg by mouth 2 (two) times daily.     Marland Kitchen doxylamine, Sleep, (UNISOM) 25 MG tablet Take 25 mg by mouth at bedtime as needed.    Marland Kitchen HYDROcodone-acetaminophen (NORCO/VICODIN) 5-325 MG tablet Take 1-2 tablets by mouth every 4 (four) hours as needed for moderate pain. 30 tablet 0  . losartan (COZAAR) 100 MG tablet Take 1 tablet (100 mg total) by mouth daily. (Patient taking  differently: Take 50 mg by mouth daily. ) 90 tablet 3  . megestrol (MEGACE) 400 MG/10ML suspension Take 5 mLs (200 mg total) by mouth 2 (two) times daily. 300 mL 0  . metoCLOPramide (REGLAN) 5 MG tablet Take 1 tablet (5 mg total) by mouth 3 (three) times daily before meals. 90 tablet 0  . Multiple Vitamin (MULTIVITAMIN WITH MINERALS) TABS tablet Take 1 tablet by mouth daily.    . ondansetron (ZOFRAN-ODT) 4 MG disintegrating tablet Take 4 mg by mouth every 8 (eight) hours as needed for nausea or vomiting.     Marland Kitchen spironolactone (ALDACTONE) 25 MG tablet TAKE 1 TABLET DAILY 30 tablet 11  . VIAGRA 50 MG tablet Take 50 mg by mouth daily as needed for erectile dysfunction.   5  . XARELTO 20 MG TABS tablet TAKE 1 TABLET DAILY WITH BREAKFAST 30 tablet 9   Current Facility-Administered Medications  Medication Dose Route Frequency Provider Last Rate Last Dose  . sodium chloride 0.9 % injection 10 mL  10 mL Intracatheter TID Markus Daft, MD  Allergies:   Amlodipine and Gabapentin    Social History:  The patient  reports that he has never smoked. He has never used smokeless tobacco. He reports that he drinks about 1.8 oz of alcohol per week . He reports that he does not use drugs.   Family History:  The patient's family history includes Emphysema in his father; Heart disease in his mother.    ROS:  Please see the history of present illness.   Otherwise, review of systems are positive for none.   All other systems are reviewed and negative.    PHYSICAL EXAM: VS:  BP 133/71   Pulse 93   Ht 5' 10"  (1.778 m)   Wt 175 lb 12.8 oz (79.7 kg)   BMI 25.22 kg/m  , BMI Body mass index is 25.22 kg/m. GEN: Well nourished, well developed, in no acute distress  HEENT: normal  Neck: no JVD, carotid bruits, or masses Cardiac: RRR; no murmurs, rubs, or gallops,Trace edema  Respiratory:  clear to auscultation bilaterally, normal work of breathing GI: soft, nontender, nondistended, + BS MS: no deformity or  atrophy  Skin: warm and dry, no rash Neuro:  Strength and sensation are intact Psych: euthymic mood, full affect   EKG:  EKG is not ordered today.   Recent Labs: 10/09/2015: TSH 1.20 11/26/2015: ALT 21 11/29/2015: BUN 10; Creatinine, Ser 1.08; Potassium 3.8; Sodium 130 11/30/2015: Hemoglobin 9.7; Platelets 574    Lipid Panel    Component Value Date/Time   CHOL 187 10/09/2015 1434   TRIG 194 (H) 10/09/2015 1434   HDL 40 10/09/2015 1434   CHOLHDL 4.7 10/09/2015 1434   VLDL 39 (H) 10/09/2015 1434   LDLCALC 108 10/09/2015 1434      Wt Readings from Last 3 Encounters:  07/21/16 175 lb 12.8 oz (79.7 kg)  07/01/16 176 lb 1.6 oz (79.9 kg)  05/06/16 165 lb (74.8 kg)         ASSESSMENT AND PLAN:  1.  Peripheral arterial disease with occluded tibial vessels due to embolization from ulcerative plaque in the aorta. He has no claudication at the present time. Continue medical therapy. Continue lifelong anticoagulation.  2. Essential hypertension: Blood pressure is now well controlled on  losartan and spironolactone. I requested basic metabolic profile to be done in 6 weeks.  3. History of colon cancer: Currently in remission.  4. Hyperlipidemia: I reviewed most recent lipid profile from last year which showed an LDL of 108. Given his history of peripheral arterial disease, he benefits from being on a statin and trying to achieve an LDL below 70. Thus, I started him on rosuvastatin 10 mg daily. Check lipid and liver profile in 6 weeks.   Disposition:   FU with me in 1 year  Signed,  Kathlyn Sacramento, MD  07/21/2016 8:39 AM    [place]

## 2016-07-27 ENCOUNTER — Encounter: Payer: Self-pay | Admitting: Family

## 2016-07-29 ENCOUNTER — Other Ambulatory Visit (HOSPITAL_BASED_OUTPATIENT_CLINIC_OR_DEPARTMENT_OTHER): Payer: 59

## 2016-07-29 ENCOUNTER — Other Ambulatory Visit: Payer: 59

## 2016-07-29 ENCOUNTER — Ambulatory Visit
Admission: RE | Admit: 2016-07-29 | Discharge: 2016-07-29 | Disposition: A | Discharge status: Home / Self Care | Payer: 59 | Source: Ambulatory Visit | Attending: Nurse Practitioner | Admitting: Nurse Practitioner

## 2016-07-29 DIAGNOSIS — C184 Malignant neoplasm of transverse colon: Secondary | ICD-10-CM | POA: Diagnosis not present

## 2016-07-29 DIAGNOSIS — C7889 Secondary malignant neoplasm of other digestive organs: Secondary | ICD-10-CM

## 2016-07-29 LAB — BASIC METABOLIC PANEL
Anion Gap: 11 mEq/L (ref 3–11)
BUN: 16.1 mg/dL (ref 7.0–26.0)
CHLORIDE: 103 meq/L (ref 98–109)
CO2: 25 mEq/L (ref 22–29)
Calcium: 9.5 mg/dL (ref 8.4–10.4)
Creatinine: 1.1 mg/dL (ref 0.7–1.3)
EGFR: 68 mL/min/{1.73_m2} — AB (ref >90)
Glucose: 129 mg/dl (ref 70–140)
POTASSIUM: 3.9 meq/L (ref 3.5–5.1)
SODIUM: 139 meq/L (ref 136–145)

## 2016-07-29 MED ORDER — IOPAMIDOL (ISOVUE-300) INJECTION 61%
100.0000 mL | Freq: Once | INTRAVENOUS | Status: AC | PRN
  Administered 2016-07-29: 100 mL via INTRAVENOUS

## 2016-07-30 ENCOUNTER — Telehealth: Payer: Self-pay

## 2016-07-30 NOTE — Telephone Encounter (Signed)
Call placed to pt to inform him of CT results and that MD Benay Spice will F/U as scheduled. Pt appreciative of call back and agrees with plan of care.

## 2016-08-04 ENCOUNTER — Ambulatory Visit (HOSPITAL_BASED_OUTPATIENT_CLINIC_OR_DEPARTMENT_OTHER): Payer: 59 | Admitting: Oncology

## 2016-08-04 ENCOUNTER — Telehealth: Payer: Self-pay

## 2016-08-04 VITALS — BP 150/62 | HR 88 | Temp 98.5°F | Resp 17 | Ht 70.0 in | Wt 176.1 lb

## 2016-08-04 DIAGNOSIS — C184 Malignant neoplasm of transverse colon: Secondary | ICD-10-CM

## 2016-08-04 NOTE — Telephone Encounter (Signed)
appts made and avs printed for patient

## 2016-08-04 NOTE — Progress Notes (Signed)
Cody Suarez has an improved performance status. He is in clinical remission from colon cancer. The restaging CT shows no evidence of progressive disease.  He will return for an office visit and CEA in 4 months.  15 minutes were spent with the patient today. The majority of the time was used for counseling and coordination of care.   08/04/2016  11:21 AM

## 2016-08-07 ENCOUNTER — Ambulatory Visit (INDEPENDENT_AMBULATORY_CARE_PROVIDER_SITE_OTHER): Payer: 59 | Admitting: Family

## 2016-08-07 ENCOUNTER — Encounter: Payer: Self-pay | Admitting: Family

## 2016-08-07 ENCOUNTER — Ambulatory Visit (HOSPITAL_COMMUNITY)
Admission: RE | Admit: 2016-08-07 | Discharge: 2016-08-07 | Disposition: A | Discharge status: Home / Self Care | Payer: 59 | Source: Ambulatory Visit | Attending: Family | Admitting: Family

## 2016-08-07 VITALS — BP 137/79 | HR 84 | Temp 97.2°F | Resp 18 | Ht 70.0 in | Wt 178.0 lb

## 2016-08-07 DIAGNOSIS — I779 Disorder of arteries and arterioles, unspecified: Secondary | ICD-10-CM

## 2016-08-07 NOTE — Patient Instructions (Signed)

## 2016-08-07 NOTE — Progress Notes (Signed)
[facility] [place]   CC: Follow up peripheral artery occlusive disease  History of Present Illness Cody Suarez is a 71 y.o. male returns for continued follow-up regarding his lower extremity arterial occlusive disease. Dr. Kellie Simmering had been monitoring this.    He has a history of colon cancer which was treated surgically in 2015and he is followed by Dr. Julieanne Manson in the cancer clinic. He had recurrence of cancer, of the splenic flexure, and had a laparotomy on 11-05-15 for this. He then developed an abscess near his pancreas and had a drain connected, this had been addressed, the drain has been removed. He feels much improved since the drain was removed January 2018, and is more active.   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  He had 2 failed attempts at revascularization of the right leg due to his small diseased tibial vessels but fortunately he has maintained limb salvage. He denies any rest pain or nonhealing ulcers. There is no numbness in the feet. He does have claudication in both calves right worse than left if he ambulates quickly.  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  Walking on pavement his right knee hurts, denies calf or thigh sx's with walking.  He has bouts of colitis and frequent stools, that seems to have mostly resolved lately with a new medication.   He denies any history of stroke or TIA.   Pt Diabetic: No Pt smoker: non-smoker  Pt meds include: Statin :yes Betablocker: No ASA: No Other anticoagulants/antiplatelets: Xarelto     Past Medical History:  Diagnosis Date  . Anemia of chronic disease 2015   "related to cancer tx" (08/09/2013)  . Blood clot in vein 2015  . Colon cancer ([place]) 03/2013  . History of kidney  stones    x 2passed them both  . Hypertension   . Peripheral vascular disease ([place])   . Ulcerative colitis ([place])    history    Social History Social History  Substance Use Topics  . Smoking status: Never Smoker  . Smokeless tobacco: Never Used     Comment: states his father died of emphysema from smoking, he did not want to be like that  . Alcohol use No     Comment: occasionally    Family History Family History  Problem Relation Age of Onset  . Heart disease Mother   . Emphysema Father     Past Surgical History:  Procedure Laterality Date  . ABDOMINAL AORTAGRAM N/A 08/09/2013   Procedure: ABDOMINAL Maxcine Ham;  Surgeon: [name], MD;  Location: [facility] CATH LAB;  Service: Cardiovascular;  Laterality: N/A;  . CHOLECYSTECTOMY N/A 09/08/2014   Procedure: LAPAROSCOPIC CHOLECYSTECTOMY WITH INTRAOPERATIVE CHOLANGIOGRAM;  Surgeon: Armandina Gemma, MD;  [facility];  Service: General;  Laterality: N/A;  . COLON SURGERY  02/2013  . COLON SURGERY  10/2015  . FEMORAL-POPLITEAL BYPASS GRAFT Right 08/11/2013   Procedure:   RIGHT - POPLITEAL TO PERONEAL ARTERY BYPASS GRAFT  WITH NONREVERSED SAPHENOUS VEIN GRAFT,tHROMBECTOMY ANTERIOR TIBIALIS,ATTEMPTED THROMBECTOMY TIBIO-PERONEAL TRUNK AND POSTERIOR TIBIALIS, INTRAOPERATIVE ARTERIOGRAM.;  Surgeon: Mal Misty, MD;  Location: [facility];  Service: Vascular;  Laterality: Right;  . FEMORAL-TIBIAL BYPASS GRAFT Right 11/17/2013   Procedure: BYPASS GRAFT RIGHT ABOVE KNEE POPLITEAL TO POSTERIOR TIBIAL  ARTERY USING RIGHT NON-REVERSED CEPHALIC VEIN;  Surgeon: Mal Misty, MD;  Location: [facility];  Service: Vascular;  Laterality: Right;  . HERNIA REPAIR  ~ 1995; 03/2013   UHR  . INTRAOPERATIVE ARTERIOGRAM Right 11/17/2013   Procedure: INTRA OPERATIVE ARTERIOGRAM;  Surgeon: Mal Misty, MD;  Location: [facility] OR;  Service: Vascular;  Laterality: Right;  . IR GENERIC HISTORICAL  12/11/2015   IR RADIOLOGIST EVAL & MGMT 12/11/2015 Arne Cleveland, MD [facility]  INTER[facility]  . IR GENERIC HISTORICAL  12/24/2015   IR RADIOLOGIST EVAL & MGMT 12/24/2015 Jacqulynn Cadet, MD [facility]  . IR GENERIC HISTORICAL  01/21/2016   IR RADIOLOGIST EVAL & MGMT 01/21/2016 [name], MD [facility]  . IR GENERIC HISTORICAL  02/04/2016   IR RADIOLOGIST EVAL & MGMT 02/04/2016 Sandi Mariscal, MD [facility]  . IR GENERIC HISTORICAL  02/12/2016   IR CATHETER TUBE CHANGE 02/12/2016 Corrie Mckusick, DO [facility]  . IR GENERIC HISTORICAL  02/20/2016   IR RADIOLOGIST EVAL & MGMT 02/20/2016 Markus Daft, MD [facility]  . LAPAROSCOPIC RIGHT HEMI COLECTOMY N/A 03/28/2013   Procedure: LAPAROSCOPIC ASSISTED HEMI COLECTOMY;  Surgeon: Pedro Earls, MD;  [facility];  Service: General;  Laterality: N/A;  . LAPAROSCOPY N/A 11/05/2015   Procedure: LAPAROSCOPY DIAGNOSTIC;  Surgeon: [name], MD;  [facility];  Service: General;  Laterality: N/A;  . LAPAROTOMY N/A 11/05/2015   Procedure: EXPLORATORY LAPAROTOMY, resection of mass at tail of pancreas;  Surgeon: [name], MD;  [facility];  Service: General;  Laterality: N/A;  . LOWER EXTREMITY ANGIOGRAM Bilateral 08/09/2013  . TEE WITHOUT CARDIOVERSION N/A 08/10/2013   Procedure: TRANSESOPHAGEAL ECHOCARDIOGRAM (TEE);  Surgeon: Candee Furbish, MD;  Location: [facility] ENDOSCOPY;  Service: Cardiovascular;  Laterality: N/A;  . TONSILLECTOMY  ~ 1950  . [place]; 03/2013  . VASECTOMY    . VEIN HARVEST Right 11/17/2013   Procedure: HARVEST OF RIGHT UPPER EXTREMITY CEPHALIC VEIN;  Surgeon: Mal Misty, MD;  Location: [facility];  Service: Vascular;  Laterality: Right;    Allergies  Allergen Reactions  . Amlodipine Swelling    Right LE edema  . Gabapentin Rash    Short-term memory decrease    Current Outpatient Prescriptions  Medication Sig Dispense Refill  . acetaminophen (TYLENOL) 500 MG tablet Take 500 mg by mouth every 6 (six) hours as needed.    . budesonide (ENTOCORT EC) 3 MG 24 hr  capsule Take 3 mg by mouth 2 (two) times daily.     Marland Kitchen doxylamine, Sleep, (UNISOM) 25 MG tablet Take 25 mg by mouth at bedtime as needed.    Marland Kitchen losartan (COZAAR) 100 MG tablet Take 1 tablet (100 mg total) by mouth daily. (Patient taking differently: Take 50 mg by mouth daily. ) 90 tablet 3  . Multiple Vitamin (MULTIVITAMIN WITH MINERALS) TABS tablet Take 1 tablet by mouth daily.    . rosuvastatin (CRESTOR) 10 MG tablet Take 10 mg by mouth daily.    Marland Kitchen spironolactone (ALDACTONE) 25 MG tablet TAKE 1 TABLET DAILY 30 tablet 11  . VIAGRA 50 MG tablet Take 50 mg by mouth daily as needed for erectile dysfunction.   5  . XARELTO 20 MG TABS tablet TAKE 1 TABLET DAILY WITH BREAKFAST 30 tablet 9  . HYDROcodone-acetaminophen (NORCO/VICODIN) 5-325 MG tablet Take 1-2 tablets by mouth every 4 (four) hours as needed for moderate pain. (Patient not taking: Reported on 08/04/2016) 30 tablet  0  . megestrol (MEGACE) 400 MG/10ML suspension Take 5 mLs (200 mg total) by mouth 2 (two) times daily. (Patient not taking: Reported on 08/04/2016) 300 mL 0  . metoCLOPramide (REGLAN) 5 MG tablet Take 1 tablet (5 mg total) by mouth 3 (three) times daily before meals. (Patient not taking: Reported on 08/04/2016) 90 tablet 0  . ondansetron (ZOFRAN-ODT) 4 MG disintegrating tablet Take 4 mg by mouth every 8 (eight) hours as needed for nausea or vomiting.      Current Facility-Administered Medications  Medication Dose Route Frequency Provider Last Rate Last Dose  . sodium chloride 0.9 % injection 10 mL  10 mL Intracatheter TID Markus Daft, MD        ROS: See HPI for pertinent positives and negatives.   Physical Examination  Vitals:   08/07/16 1319  BP: 137/79  Pulse: 84  Resp: 18  Temp: (!) 97.2 F (36.2 C)  TempSrc: Oral  SpO2: 98%  Weight: 178 lb (80.7 kg)  Height: 5' 10"  (1.778 m)   Body mass index is 25.54 kg/m.  General: A&O x 3, WDWN, male. Gait: slow, steady Eyes: PERRLA. Pulmonary: Respirations are non labored,  CTAB, good air movement in all fields. Cardiac: regular rhythm, no detected murmur.    Carotid Bruits Right Left   Negative Negative   Aorta is notpalpable. Radial pulses: 2+ palpable and =  VASCULAR EXAM: Extremitieswithoutischemic changes, withoutGangrene; withoutopen wounds.  LE Pulses Right Left  FEMORAL 2+palpable 2+palpable   POPLITEAL notpalpable  notpalpable  POSTERIOR TIBIAL notpalpable  notpalpable   DORSALIS PEDIS ANTERIOR TIBIAL notpalpable  1+palpable    Abdomen: soft, NT, no palpable masses.  Skin: no rashes, no ulcers noted. Musculoskeletal: no muscle wasting or atrophy. Neurologic: A&O X 3; appropriate affect, sensation is normal; MOTOR FUNCTION: moving all extremities equally, motor strength 5/5 throughout. Speech is fluent/normal. CN 2-12 intact.     ASSESSMENT: Cody Suarez is a 71 y.o. male who is s/p failed right popliteal-peroneal artery bypass graft on 08-09-13 and failed right popliteal to posterior tibial arterial bypass graft on 11-17-13. This was due to his small diseased tibial vessels but fortunately he has maintained limb salvage.  He was walking 8 hours/day on his job until he developed recurrent colon cancer in October 2017, had several major setbacks, with significant weight loss and weakness.  His walking is now limited by his right knee pain, he feels improved since his left flank drain was removed and seems to be walking more.  He has no signs of ischemia in his feet/legs.  Pt states he does not want any more surgery, as he has been through so much lately. He is feeling well lately.    DATA   ABI (Date: 08/07/2016)  R:   ABI: 0.59 (was 0.51),   PT: not documented  DP: not documented  TBI:  0.42 (was 0.20)  L:   ABI: 0.75 (was 0.59),   PT: not documented  DP: not documented  TBI: 0.46 (was 0.38)  Slight improvement in  bilateral ABI: moderate arterial occlusive disease bilaterally.     PLAN:  Continue graduated walking program. Daily seated leg exercises discussed and demonstrated.  Based on the patient's vascular studies and examination, pt will return to clinic in 6 monthswith ABI's. I advised pt and wife to notify us if he develops concerns re the circulation in his feet/legs.     I discussed in depth with the patient the nature of atherosclerosis, and emphasized the importance  of maximal medical management including strict control of blood pressure, blood glucose, and lipid levels, obtaining regular exercise, and continued cessation of smoking.  The patient is aware that without maximal medical management the underlying atherosclerotic disease process will progress, limiting the benefit of any interventions.  The patient was given information about PAD including signs, symptoms, treatment, what symptoms should prompt the patient to seek immediate medical care, and risk reduction measures to take.  Clemon Chambers, RN, MSN, FNP-C [facility] Phone: [phone]  Clinic MD: Donzetta Matters  08/07/16 1:24 PM

## 2016-08-10 NOTE — Addendum Note (Signed)
Addended by: Lianne Cure A on: 08/10/2016 03:23 PM   Modules accepted: Orders

## 2016-08-11 ENCOUNTER — Ambulatory Visit: Payer: 59 | Admitting: Cardiovascular Disease

## 2016-08-12 ENCOUNTER — Other Ambulatory Visit: Payer: Self-pay | Admitting: Cardiovascular Disease

## 2016-08-12 DIAGNOSIS — I10 Essential (primary) hypertension: Secondary | ICD-10-CM

## 2016-10-08 ENCOUNTER — Telehealth: Payer: Self-pay | Admitting: Cardiovascular Disease

## 2016-10-08 DIAGNOSIS — Z79899 Other long term (current) drug therapy: Secondary | ICD-10-CM

## 2016-10-08 DIAGNOSIS — D649 Anemia, unspecified: Secondary | ICD-10-CM

## 2016-10-08 NOTE — Telephone Encounter (Signed)
PT calling to schedule appt Scheduled for 11/13 with Dr Fletcher Anon at [facility] PT states he would like sooner He is experiencing swelling in both feet and the swelling has not been going down PT does not want to be seen at [facility] office  Please call to advise

## 2016-10-08 NOTE — Telephone Encounter (Signed)
Returned call to patient of Dr. Fletcher Anon who has swelling in his feet for a few weeks - he has had swelling in his feet before but this would resolve with rest/elevation at night - now not resolving with rest. He has gained about 5lbs in one month. Denies shortness of breath. He cannot use compression stockings b/c he had surgery on right leg for a blood clot. He has not consumed any more salt/sodium than usual. He has been taking meds for colitis and is now on Lialda. He is scheduled to see MD on Nov 13. Advised would notify MD of his concerns and see if he has any recommendations.   He said he can be reached on his cell phone.

## 2016-10-08 NOTE — Telephone Encounter (Signed)
Attempted to contact patient. Someone answered and then hung up

## 2016-10-08 NOTE — Telephone Encounter (Signed)
Follow up      Pt daughter called re swelling  Pt c/o swelling: STAT is pt has developed SOB within 24 hours  1) How much weight have you gained and in what time span? Does not know   2) If swelling, where is the swelling located?  A week or two   3) Are you currently taking a fluid pill? no  Are you currently SOB? Not that daughter is aware 4) Do you have a log of your daily weights (if so, list)?  no  Have you gained 3 pounds in a day or 5 pounds in a week? No  5) Have you traveled recently?  No

## 2016-10-08 NOTE — Telephone Encounter (Signed)
Follow up     Pt is calling back for nurse. He said he has to leave in 15 minutes for another appt but he can be called on his cell phone.

## 2016-10-09 NOTE — Telephone Encounter (Signed)
We need to check labs on him to make sure his renal function is not affected and also make sure that his anemia is not worse. Recommend basic metabolic profile, CBC and liver profile. If his labs look okay, we can try small dose furosemide for few days.

## 2016-10-09 NOTE — Telephone Encounter (Signed)
Spoke with patient's wife. Patient is sick with a a GI bug (?). Advised that MD has recommended lab work and orders would be in system and patient can come at his convenience for lab work.

## 2016-10-23 LAB — CBC
Hematocrit: 38.3 % (ref 37.5–51.0)
Hemoglobin: 12.8 g/dL — ABNORMAL LOW (ref 13.0–17.7)
MCH: 26.6 pg (ref 26.6–33.0)
MCHC: 33.4 g/dL (ref 31.5–35.7)
MCV: 80 fL (ref 79–97)
Platelets: 485 10*3/uL — ABNORMAL HIGH (ref 150–379)
RBC: 4.82 x10E6/uL (ref 4.14–5.80)
RDW: 16.5 % — ABNORMAL HIGH (ref 12.3–15.4)
WBC: 14.3 10*3/uL — AB (ref 3.4–10.8)

## 2016-10-23 LAB — BASIC METABOLIC PANEL
BUN/Creatinine Ratio: 8 — ABNORMAL LOW (ref 10–24)
BUN: 9 mg/dL (ref 8–27)
CO2: 22 mmol/L (ref 20–29)
CREATININE: 1.06 mg/dL (ref 0.76–1.27)
Calcium: 8.8 mg/dL (ref 8.6–10.2)
Chloride: 99 mmol/L (ref 96–106)
GFR calc Af Amer: 81 mL/min/{1.73_m2} (ref >59)
GFR calc non Af Amer: 70 mL/min/{1.73_m2} (ref >59)
GLUCOSE: 163 mg/dL — AB (ref 65–99)
Potassium: 3.8 mmol/L (ref 3.5–5.2)
Sodium: 141 mmol/L (ref 134–144)

## 2016-10-23 LAB — HEPATIC FUNCTION PANEL
ALBUMIN: 3.7 g/dL (ref 3.5–4.8)
ALT: 12 IU/L (ref 0–44)
AST: 18 IU/L (ref 0–40)
Alkaline Phosphatase: 47 IU/L (ref 39–117)
BILIRUBIN TOTAL: 0.2 mg/dL (ref 0.0–1.2)
BILIRUBIN, DIRECT: 0.11 mg/dL (ref 0.00–0.40)
TOTAL PROTEIN: 6.7 g/dL (ref 6.0–8.5)

## 2016-10-28 ENCOUNTER — Telehealth: Payer: Self-pay | Admitting: *Deleted

## 2016-10-28 MED ORDER — FUROSEMIDE 20 MG PO TABS: 20.0000 mg | ORAL_TABLET | Freq: Every day | ORAL | 0 refills | Status: DC

## 2016-10-28 NOTE — Telephone Encounter (Signed)
Spoke with pt, aware of lab results. He does continue to have swelling in his legs. He reports it does go down some at night but by the end of the day he has to loosen his shoe laces. He denies any SOB or other problems. He has a follow up appt with dr Fletcher Anon 11-17-16. Will forward to dr Fletcher Anon to review and advise if need to do anything prior to appt.

## 2016-10-28 NOTE — Telephone Encounter (Signed)
We can try Lasix 20 mg daily for 5 days.

## 2016-10-28 NOTE — Telephone Encounter (Signed)
Spoke with pt, aware of dr Tyrell Antonio recommendations. New script sent to the pharmacy.

## 2016-10-28 NOTE — Telephone Encounter (Signed)
-----   Message from [name], MD sent at 10/23/2016  4:01 PM EDT ----- Labs look fine.  Is he still having problems with leg edema?

## 2016-11-02 ENCOUNTER — Telehealth: Payer: Self-pay | Admitting: Cardiovascular Disease

## 2016-11-02 NOTE — Telephone Encounter (Signed)
New message   Patient states Lasix was called in for him last week. No Lasix remaining, but feet are still swollen. Does not feel Lasix helped. Patient is unsure if he should get a refill or be prescribed  something different.    Pt c/o medication issue:  1. Name of Medication: furosemide (LASIX) 20 MG tablet  2. How are you currently taking this medication (dosage and times per day)? As prescribed  3. Are you having a reaction (difficulty breathing--STAT)? NO  4. What is your medication issue? Feet swollen

## 2016-11-02 NOTE — Telephone Encounter (Signed)
SPOKE WITH PATIENT ,  SINCE SWELLING HAS NOT GOTTEN BETTER AFTER TAKING  LASIX . MOVED APPOINTMENT UP TO 11/03/16 W/DR ARIDA. PATIENT AWARE VERBALIZED UNDERSTANDING.

## 2016-11-03 ENCOUNTER — Ambulatory Visit (INDEPENDENT_AMBULATORY_CARE_PROVIDER_SITE_OTHER): Payer: 59 | Admitting: Cardiovascular Disease

## 2016-11-03 VITALS — BP 142/68 | HR 84 | Ht 70.0 in | Wt 180.0 lb

## 2016-11-03 DIAGNOSIS — R609 Edema, unspecified: Secondary | ICD-10-CM

## 2016-11-03 DIAGNOSIS — I10 Essential (primary) hypertension: Secondary | ICD-10-CM

## 2016-11-03 DIAGNOSIS — I739 Peripheral vascular disease, unspecified: Secondary | ICD-10-CM | POA: Diagnosis not present

## 2016-11-03 DIAGNOSIS — Z79899 Other long term (current) drug therapy: Secondary | ICD-10-CM

## 2016-11-03 DIAGNOSIS — E785 Hyperlipidemia, unspecified: Secondary | ICD-10-CM

## 2016-11-03 MED ORDER — TORSEMIDE 20 MG PO TABS: 20.0000 mg | ORAL_TABLET | Freq: Two times a day (BID) | ORAL | 3 refills | Status: DC

## 2016-11-03 NOTE — Progress Notes (Signed)
Cardiology Office Note   Date:  11/03/2016   ID:  Cody Suarez [birth year], MRN [id number]  PCP:  Cody Minium, MD  Cardiologist:   Cody Sacramento, MD   No chief complaint on file.     History of Present Illness: Cody Suarez is a 71 y.o. male who presents a follow-up visit regarding  peripheral arterial disease and hypertension.  He was diagnosed with stage II colon cancer early 2015 and underwent partial colectomy in February followed by chemotherapy with Xeloda. He was seen in July 2015 for bilateral calf discomfort. Angiography was done in August 2015 which showed: 1. No significant aortoiliac disease. 2. 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 One-vessel runoff below the knee bilaterally via the peroneal artery which is occluded proximally but does reconstitute via collaterals. TEE was performed to evaluate source of embolism. It showed normal LV systolic function and wall motion. There was a mobile thrombus in the proximal decending aorta on top of an ulcerated plaque. CTA showed an ulcerated plaque in the proximal descending aorta with thrombus. He was evaluated by Dr. Trula Suarez. Anticoagulation was recommended and he has been on Xarelto since then. He underwent right SFA to posterior tibial bypass by Dr. Kellie Suarez multiple times with poor outcome due to poor runoff.  He started having significant bilateral leg swelling recently.  We did routine labs which overall were unremarkable.  I gave him furosemide 20 mg once daily for 5 days with no significant change.  His urine output also did not change with that. He denies any chest pain.  He has stable exertional dyspnea.  No significant claudication.  Past Medical History:  Diagnosis Date  . Anemia of chronic disease 2015   "related to cancer tx" (08/09/2013)  . Blood clot in vein 2015  . Colon cancer (Cody Suarez) 03/2013  . History of kidney stones    x  2passed them both  . Hypertension   . Peripheral vascular disease (Cody Suarez)   . Ulcerative colitis (Cody Suarez)    history    Past Surgical History:  Procedure Laterality Date  . ABDOMINAL AORTAGRAM N/A 08/09/2013   Procedure: ABDOMINAL Maxcine Ham;  Surgeon: [name], MD;  Location: [facility] [facility];  Service: Cardiovascular;  Laterality: N/A;  . CHOLECYSTECTOMY N/A 09/08/2014   Procedure: LAPAROSCOPIC CHOLECYSTECTOMY WITH INTRAOPERATIVE CHOLANGIOGRAM;  Surgeon: Armandina Gemma, MD;  Location: [facility];  Service: General;  Laterality: N/A;  . COLON SURGERY  02/2013  . COLON SURGERY  10/2015  . FEMORAL-POPLITEAL BYPASS GRAFT Right 08/11/2013   Procedure:   RIGHT - POPLITEAL TO PERONEAL ARTERY BYPASS GRAFT  WITH NONREVERSED SAPHENOUS VEIN GRAFT,tHROMBECTOMY ANTERIOR TIBIALIS,ATTEMPTED THROMBECTOMY TIBIO-PERONEAL TRUNK AND POSTERIOR TIBIALIS, INTRAOPERATIVE ARTERIOGRAM.;  Surgeon: Mal Misty, MD;  Location: [place];  Service: Vascular;  Laterality: Right;  . FEMORAL-TIBIAL BYPASS GRAFT Right 11/17/2013   Procedure: BYPASS GRAFT RIGHT ABOVE KNEE POPLITEAL TO POSTERIOR TIBIAL ARTERY USING RIGHT NON-REVERSED CEPHALIC VEIN;  Surgeon: Mal Misty, MD;  Location: [place];  Service: Vascular;  Laterality: Right;  . HERNIA REPAIR  ~ 1995; 03/2013   UHR  . INTRAOPERATIVE ARTERIOGRAM Right 11/17/2013   Procedure: INTRA OPERATIVE ARTERIOGRAM;  Surgeon: Mal Misty, MD;  Location: [facility] OR;  Service: Vascular;  Laterality: Right;  . IR GENERIC HISTORICAL  12/11/2015   IR RADIOLOGIST EVAL & MGMT 12/11/2015 Arne Cleveland, MD [facility]  . IR GENERIC HISTORICAL  12/24/2015  IR RADIOLOGIST EVAL & MGMT 12/24/2015 Jacqulynn Cadet, MD [facility]  . IR GENERIC HISTORICAL  01/21/2016   IR RADIOLOGIST EVAL & MGMT 01/21/2016 [name], MD [facility]  . IR GENERIC HISTORICAL  02/04/2016   IR RADIOLOGIST EVAL & MGMT 02/04/2016 Sandi Mariscal, MD [facility]  . IR GENERIC HISTORICAL  02/12/2016   IR CATHETER  TUBE CHANGE 02/12/2016 Corrie Mckusick, DO [facility]  . IR GENERIC HISTORICAL  02/20/2016   IR RADIOLOGIST EVAL & MGMT 02/20/2016 Markus Daft, MD [facility]  . LAPAROSCOPIC RIGHT HEMI COLECTOMY N/A 03/28/2013   Procedure: LAPAROSCOPIC ASSISTED HEMI COLECTOMY;  Surgeon: Pedro Earls, MD;  Location: [facility];  Service: General;  Laterality: N/A;  . LAPAROSCOPY N/A 11/05/2015   Procedure: LAPAROSCOPY DIAGNOSTIC;  Surgeon: [name], MD;  Location: [facility];  Service: General;  Laterality: N/A;  . LAPAROTOMY N/A 11/05/2015   Procedure: EXPLORATORY LAPAROTOMY, resection of mass at tail of pancreas;  Surgeon: [name], MD;  Location: [facility];  Service: General;  Laterality: N/A;  . LOWER EXTREMITY ANGIOGRAM Bilateral 08/09/2013  . TEE WITHOUT CARDIOVERSION N/A 08/10/2013   Procedure: TRANSESOPHAGEAL ECHOCARDIOGRAM (TEE);  Surgeon: Candee Furbish, MD;  [facility];  Service: Cardiovascular;  Laterality: N/A;  . TONSILLECTOMY  ~ 1950  . [place]; 03/2013  . VASECTOMY    . VEIN HARVEST Right 11/17/2013   Procedure: HARVEST OF RIGHT UPPER EXTREMITY CEPHALIC VEIN;  Surgeon: Mal Misty, MD;  Location: [place];  Service: Vascular;  Laterality: Right;     Current Outpatient Prescriptions  Medication Sig Dispense Refill  . acetaminophen (TYLENOL) 500 MG tablet Take 500 mg by mouth every 6 (six) hours as needed.    . colestipol (COLESTID) 1 g tablet TAKE 1 TABLET BY MOUTH 1 TO 2 TIMES A DAY, NOT WITHIN AN HOUR OF OTHER MEDS  3  . doxylamine, Sleep, (UNISOM) 25 MG tablet Take 25 mg by mouth at bedtime as needed.    Marland Kitchen FLUZONE HIGH-DOSE 0.5 ML injection TO BE ADMINISTERED BY PHARMACIST FOR IMMUNIZATION  0  . LIALDA 1.2 g EC tablet TAKE 4 TABLETS EVERY MORNING  5  . losartan (COZAAR) 100 MG tablet Take 1 tablet (100 mg total) by mouth daily. (Patient taking differently: Take 50 mg by mouth daily. ) 90 tablet 3  . metroNIDAZOLE (FLAGYL) 250 MG tablet Take 250 mg by mouth 3  (three) times daily.    . Multiple Vitamin (MULTIVITAMIN WITH MINERALS) TABS tablet Take 1 tablet by mouth daily.    . ondansetron (ZOFRAN-ODT) 4 MG disintegrating tablet Take 4 mg by mouth every 8 (eight) hours as needed for nausea or vomiting.     . rosuvastatin (CRESTOR) 10 MG tablet Take 10 mg by mouth daily.    Marland Kitchen spironolactone (ALDACTONE) 25 MG tablet Take 25 mg by mouth daily.    Marland Kitchen UCERIS 9 MG TB24 Take 1 tablet by mouth daily.  5  . XARELTO 20 MG TABS tablet TAKE 1 TABLET DAILY WITH BREAKFAST 30 tablet 9   Current Facility-Administered Medications  Medication Dose Route Frequency Provider Last Rate Last Dose  . sodium chloride 0.9 % injection 10 mL  10 mL Intracatheter TID Markus Daft, MD        Allergies:   Amlodipine and Gabapentin    Social History:  The patient  reports that he has never smoked. He has never used smokeless tobacco. He reports that he does not  drink alcohol or use drugs.   Family History:  The patient's family history includes Emphysema in his father; Heart disease in his mother.    ROS:  Please see the history of present illness.   Otherwise, review of systems are positive for none.   All other systems are reviewed and negative.    PHYSICAL EXAM: VS:  BP (!) 142/68   Pulse 84   Ht 5' 10"  (1.778 m)   Wt 180 lb (81.6 kg)   BMI 25.83 kg/m  , BMI Body mass index is 25.83 kg/m. GEN: Well nourished, well developed, in no acute distress  HEENT: normal  Neck: no JVD, carotid bruits, or masses Cardiac: RRR; no murmurs, rubs, or gallops, moderate to severe bilateral leg edema to the knees Respiratory:  clear to auscultation bilaterally, normal work of breathing GI: soft, nontender, nondistended, + BS MS: no deformity or atrophy  Skin: warm and dry, no rash Neuro:  Strength and sensation are intact Psych: euthymic mood, full affect   EKG:  EKG is ordered today. EKG showed normal sinus rhythm with left axis deviation   Recent Labs: 10/22/2016: ALT 12;  BUN 9; Creatinine, Ser 1.06; Hemoglobin 12.8; Platelets 485; Potassium 3.8; Sodium 141    Lipid Panel    Component Value Date/Time   CHOL 187 10/09/2015 1434   TRIG 194 (H) 10/09/2015 1434   HDL 40 10/09/2015 1434   CHOLHDL 4.7 10/09/2015 1434   VLDL 39 (H) 10/09/2015 1434   LDLCALC 108 10/09/2015 1434      Wt Readings from Last 3 Encounters:  11/03/16 180 lb (81.6 kg)  08/07/16 178 lb (80.7 kg)  08/04/16 176 lb 1.6 oz (79.9 kg)       ASSESSMENT AND PLAN:  1.  Severe bilateral leg edema: The exact etiology is not entirely clear.  Recent labs showed normal albumin.  By physical exam, no evidence of right-sided heart failure.  DVT is unlikely with him being on long-term anticoagulation with Xarelto. This could be due to chronic venous insufficiency.  He did not respond very well to furosemide.  I advised him to elevate his legs at least 3 times during the day.  No support stockings given significant peripheral arterial disease. I requested an echocardiogram to ensure no pulmonary hypertension. I elected to start him on torsemide 20 mg once daily.  Check basic metabolic profile in 1 week.  Consider evaluation for proteinuria.  2. Peripheral arterial disease with occluded tibial vessels due to embolization from ulcerative plaque in the aorta. He has no claudication at the present time. Continue medical therapy. Continue lifelong anticoagulation.  3. Essential hypertension: Blood pressure is well controlled on  losartan and spironolactone.   4. History of colon cancer: Currently in remission.  5. Hyperlipidemia: Continue treatment with rosuvastatin.  Check follow-up lipid profile.  Disposition:   FU with me in 2 months  Signed,  Cody Sacramento, MD  11/03/2016 10:36 AM    [place]

## 2016-11-03 NOTE — Patient Instructions (Signed)
Medication Instructions:  START- Torsemide 20 mg daily  If you need a refill on your cardiac medications before your next appointment, please call your pharmacy.  Labwork: BMP and Fasting Lipids in 1 week HERE IN OUR OFFICE AT [facility]  Testing/Procedures: Your physician has requested that you have an echocardiogram. Echocardiography is a painless test that uses sound waves to create images of your heart. It provides your doctor with information about the size and shape of your heart and how well your heart's chambers and valves are working. This procedure takes approximately one hour. There are no restrictions for this procedure.  Follow-Up: Your physician wants you to follow-up in: 2 Months.    Thank you for choosing [facility] at [facility]!!      Low-Sodium Eating Plan Sodium, which is an element that makes up salt, helps you maintain a healthy balance of fluids in your body. Too much sodium can increase your blood pressure and cause fluid and waste to be held in your body. Your health care provider or dietitian may recommend following this plan if you have high blood pressure (hypertension), kidney disease, liver disease, or heart failure. Eating less sodium can help lower your blood pressure, reduce swelling, and protect your heart, liver, and kidneys. What are tips for following this plan? General guidelines  Most people on this plan should limit their sodium intake to 1,500-2,000 mg (milligrams) of sodium each day. Reading food labels  The Nutrition Facts label lists the amount of sodium in one serving of the food. If you eat more than one serving, you must multiply the listed amount of sodium by the number of servings.  Choose foods with less than 140 mg of sodium per serving.  Avoid foods with 300 mg of sodium or more per serving. Shopping  Look for lower-sodium products, often labeled as "low-sodium" or "no salt added."  Always check the sodium content even if  foods are labeled as "unsalted" or "no salt added".  Buy fresh foods. ? Avoid canned foods and premade or frozen meals. ? Avoid canned, cured, or processed meats  Buy breads that have less than 80 mg of sodium per slice. Cooking  Eat more home-cooked food and less restaurant, buffet, and fast food.  Avoid adding salt when cooking. Use salt-free seasonings or herbs instead of table salt or sea salt. Check with your health care provider or pharmacist before using salt substitutes.  Cook with plant-based oils, such as canola, sunflower, or olive oil. Meal planning  When eating at a restaurant, ask that your food be prepared with less salt or no salt, if possible.  Avoid foods that contain MSG (monosodium glutamate). MSG is sometimes added to [place] food, bouillon, and some canned foods. What foods are recommended? The items listed may not be a complete list. Talk with your dietitian about what dietary choices are best for you. Grains Low-sodium cereals, including oats, puffed wheat and rice, and shredded wheat. Low-sodium crackers. Unsalted rice. Unsalted pasta. Low-sodium bread. Whole-grain breads and whole-grain pasta. Vegetables Fresh or frozen vegetables. "No salt added" canned vegetables. "No salt added" tomato sauce and paste. Low-sodium or reduced-sodium tomato and vegetable juice. Fruits Fresh, frozen, or canned fruit. Fruit juice. Meats and other protein foods Fresh or frozen (no salt added) meat, poultry, seafood, and fish. Low-sodium canned tuna and salmon. Unsalted nuts. Dried peas, beans, and lentils without added salt. Unsalted canned beans. Eggs. Unsalted nut butters. Dairy Milk. Soy milk. Cheese that is naturally low in sodium,  such as ricotta cheese, fresh mozzarella, or Swiss cheese Low-sodium or reduced-sodium cheese. Cream cheese. Yogurt. Fats and oils Unsalted butter. Unsalted margarine with no trans fat. Vegetable oils such as canola or olive oils. Seasonings and  other foods Fresh and dried herbs and spices. Salt-free seasonings. Low-sodium mustard and ketchup. Sodium-free salad dressing. Sodium-free light mayonnaise. Fresh or refrigerated horseradish. Lemon juice. Vinegar. Homemade, reduced-sodium, or low-sodium soups. Unsalted popcorn and pretzels. Low-salt or salt-free chips. What foods are not recommended? The items listed may not be a complete list. Talk with your dietitian about what dietary choices are best for you. Grains Instant hot cereals. Bread stuffing, pancake, and biscuit mixes. Croutons. Seasoned rice or pasta mixes. Noodle soup cups. Boxed or frozen macaroni and cheese. Regular salted crackers. Self-rising flour. Vegetables Sauerkraut, pickled vegetables, and relishes. Olives. [place] fries. Onion rings. Regular canned vegetables (not low-sodium or reduced-sodium). Regular canned tomato sauce and paste (not low-sodium or reduced-sodium). Regular tomato and vegetable juice (not low-sodium or reduced-sodium). Frozen vegetables in sauces. Meats and other protein foods Meat or fish that is salted, canned, smoked, spiced, or pickled. Bacon, ham, sausage, hotdogs, corned beef, chipped beef, packaged lunch meats, salt pork, jerky, pickled herring, anchovies, regular canned tuna, sardines, salted nuts. Dairy Processed cheese and cheese spreads. Cheese curds. Blue cheese. Feta cheese. String cheese. Regular cottage cheese. Buttermilk. Canned milk. Fats and oils Salted butter. Regular margarine. Ghee. Bacon fat. Seasonings and other foods Onion salt, garlic salt, seasoned salt, table salt, and sea salt. Canned and packaged gravies. Worcestershire sauce. Tartar sauce. Barbecue sauce. Teriyaki sauce. Soy sauce, including reduced-sodium. Steak sauce. Fish sauce. Oyster sauce. Cocktail sauce. Horseradish that you find on the shelf. Regular ketchup and mustard. Meat flavorings and tenderizers. Bouillon cubes. Hot sauce and Tabasco sauce. Premade or packaged  marinades. Premade or packaged taco seasonings. Relishes. Regular salad dressings. Salsa. Potato and tortilla chips. Corn chips and puffs. Salted popcorn and pretzels. Canned or dried soups. Pizza. Frozen entrees and pot pies. Summary  Eating less sodium can help lower your blood pressure, reduce swelling, and protect your heart, liver, and kidneys.  Most people on this plan should limit their sodium intake to 1,500-2,000 mg (milligrams) of sodium each day.  Canned, boxed, and frozen foods are high in sodium. Restaurant foods, fast foods, and pizza are also very high in sodium. You also get sodium by adding salt to food.  Try to cook at home, eat more fresh fruits and vegetables, and eat less fast food, canned, processed, or prepared foods. This information is not intended to replace advice given to you by your health care provider. Make sure you discuss any questions you have with your health care provider. Document Released: 06/13/2001 Document Revised: 12/16/2015 Document Reviewed: 12/16/2015 Elsevier Interactive Patient Education  2017 [employer].

## 2016-11-05 LAB — BASIC METABOLIC PANEL
BUN / CREAT RATIO: 13 (ref 10–24)
BUN: 15 mg/dL (ref 8–27)
CALCIUM: 9.7 mg/dL (ref 8.6–10.2)
CHLORIDE: 95 mmol/L — AB (ref 96–106)
CO2: 25 mmol/L (ref 20–29)
Creatinine, Ser: 1.15 mg/dL (ref 0.76–1.27)
GFR, EST AFRICAN AMERICAN: 74 mL/min/{1.73_m2} (ref >59)
GFR, EST NON AFRICAN AMERICAN: 64 mL/min/{1.73_m2} (ref >59)
Glucose: 179 mg/dL — ABNORMAL HIGH (ref 65–99)
POTASSIUM: 4 mmol/L (ref 3.5–5.2)
Sodium: 140 mmol/L (ref 134–144)

## 2016-11-05 LAB — LIPID PANEL
CHOLESTEROL TOTAL: 132 mg/dL (ref 100–199)
Chol/HDL Ratio: 3.1 ratio (ref 0.0–5.0)
HDL: 42 mg/dL (ref >39)
LDL Calculated: 59 mg/dL (ref 0–99)
Triglycerides: 155 mg/dL — ABNORMAL HIGH (ref 0–149)
VLDL Cholesterol Cal: 31 mg/dL (ref 5–40)

## 2016-11-10 ENCOUNTER — Encounter: Payer: Self-pay | Admitting: *Deleted

## 2016-11-17 ENCOUNTER — Other Ambulatory Visit: Payer: Self-pay | Admitting: Cardiovascular Disease

## 2016-11-17 ENCOUNTER — Ambulatory Visit: Payer: 59 | Admitting: Cardiovascular Disease

## 2016-11-17 NOTE — Telephone Encounter (Signed)
Please review for refill, Thanks !  

## 2016-12-04 ENCOUNTER — Ambulatory Visit (HOSPITAL_COMMUNITY): Payer: 59 | Attending: Cardiology

## 2016-12-04 ENCOUNTER — Other Ambulatory Visit: Payer: Self-pay

## 2016-12-04 DIAGNOSIS — I739 Peripheral vascular disease, unspecified: Secondary | ICD-10-CM | POA: Diagnosis not present

## 2016-12-04 DIAGNOSIS — R600 Localized edema: Secondary | ICD-10-CM | POA: Diagnosis not present

## 2016-12-04 DIAGNOSIS — R609 Edema, unspecified: Secondary | ICD-10-CM | POA: Insufficient documentation

## 2016-12-04 DIAGNOSIS — I10 Essential (primary) hypertension: Secondary | ICD-10-CM | POA: Diagnosis not present

## 2016-12-04 DIAGNOSIS — I5189 Other ill-defined heart diseases: Secondary | ICD-10-CM | POA: Diagnosis not present

## 2016-12-11 ENCOUNTER — Telehealth: Payer: Self-pay | Admitting: *Deleted

## 2016-12-11 MED ORDER — TORSEMIDE 20 MG PO TABS: 20.0000 mg | ORAL_TABLET | Freq: Two times a day (BID) | ORAL | 3 refills | Status: DC | PRN

## 2016-12-11 NOTE — Telephone Encounter (Signed)
Spoke with pt, he received the message regarding his echo results and is pleased. He was calling to give an update, 4 days after taking the torsemide and elevating his legs the swelling is completely gone. He has 2 questions, does he continue to take torsemide and does he keep the appointment he has on 12-22-16. Will forward for dr arida's review and advise.

## 2016-12-11 NOTE — Telephone Encounter (Signed)
Spoke with pt, aware of dr Tyrell Antonio recommendations.

## 2016-12-11 NOTE — Telephone Encounter (Signed)
No need to take torsemide daily.  He can use that as needed. No need for an appointment on the 18th.  He should follow-up with me in 6 months.

## 2016-12-14 ENCOUNTER — Other Ambulatory Visit: Payer: 59

## 2016-12-14 ENCOUNTER — Ambulatory Visit: Payer: 59 | Admitting: Oncology

## 2016-12-16 ENCOUNTER — Telehealth: Payer: Self-pay | Admitting: Oncology

## 2016-12-16 NOTE — Telephone Encounter (Signed)
Patient called in to reschedule

## 2016-12-22 ENCOUNTER — Ambulatory Visit: Payer: 59 | Admitting: Cardiovascular Disease

## 2016-12-24 ENCOUNTER — Encounter: Payer: Self-pay | Admitting: Family

## 2016-12-24 ENCOUNTER — Ambulatory Visit (INDEPENDENT_AMBULATORY_CARE_PROVIDER_SITE_OTHER): Payer: 59 | Admitting: Family

## 2016-12-24 ENCOUNTER — Ambulatory Visit (HOSPITAL_COMMUNITY)
Admission: RE | Admit: 2016-12-24 | Discharge: 2016-12-24 | Disposition: A | Discharge status: Home / Self Care | Payer: 59 | Source: Ambulatory Visit | Attending: Family | Admitting: Family

## 2016-12-24 VITALS — BP 126/73 | HR 89 | Temp 97.1°F | Resp 18 | Wt 172.2 lb

## 2016-12-24 DIAGNOSIS — I779 Disorder of arteries and arterioles, unspecified: Secondary | ICD-10-CM

## 2016-12-24 NOTE — Patient Instructions (Signed)

## 2016-12-24 NOTE — Progress Notes (Signed)
[facility] [place]   CC: Follow up peripheral artery occlusive disease  History of Present Illness Cody Suarez is a 71 y.o. male returns for continued follow-up regarding his lower extremity arterial occlusive disease. Dr. Kellie Simmering had been monitoring this.    He has a history of colon cancer which was treated surgically in 2015and he is followed by Dr. Julieanne Manson in the cancer clinic. He had recurrence of cancer, of the splenic flexure, and had a laparotomy on 11-05-15 for this. He then developed an abscess near his pancreas and hada drain connected,this had been addressed, the drain has been removed. He feels much improved since the drain was removed January 2018, and is more active.   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  He had 2 failed attempts at revascularization of the right leg due to his small diseased tibial vessels but fortunately he has maintained limb salvage. He denies any rest pain or nonhealing ulcers. There is no numbness in the feet. He does have claudication in both calves right worse than left if he ambulates quickly.  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  He states he walks all day, keeps busy, denies claudication type sx's with walking, his walking is limited by right knee pain, he denies dyspnea or chest pain.  He has bouts of colitis and frequent stools, that seems to have mostly resolved lately with a new medication.   He denies any history of stroke or TIA.   Pt Diabetic: No Pt smoker: non-smoker  Pt meds include: Statin :no, states he was advised to stop taking as his cholesterol was good, states he only took tab 3 times as he kept forgetting to take.  Betablocker: No ASA: No Other anticoagulants/antiplatelets:  Xarelto     Past Medical History:  Diagnosis Date  . Anemia of chronic disease 2015   "related to cancer tx" (08/09/2013)  . Blood clot in vein 2015  . Colon cancer ([place]) 03/2013  . History of kidney stones    x 2passed them both  . Hypertension   . Peripheral vascular disease ([place])   . Ulcerative colitis ([place])    history    Social History Social History   Tobacco Use  . Smoking status: Never Smoker  . Smokeless tobacco: Never Used  . Tobacco comment: states his father died of emphysema from smoking, he did not want to be like that  Substance Use Topics  . Alcohol use: No    Alcohol/week: 1.8 oz    Types: 3 Glasses of wine per week    Comment: occasionally  . Drug use: No    Family History Family History  Problem Relation Age of Onset  . Heart disease Mother   . Emphysema Father     Past Surgical History:  Procedure Laterality Date  . ABDOMINAL AORTAGRAM N/A 08/09/2013   Procedure: ABDOMINAL Maxcine Ham;  Surgeon: [name], MD;  Location: [facility];  Service: Cardiovascular;  Laterality: N/A;  . CHOLECYSTECTOMY N/A 09/08/2014   Procedure: LAPAROSCOPIC CHOLECYSTECTOMY WITH INTRAOPERATIVE CHOLANGIOGRAM;  Surgeon: Armandina Gemma, MD;  Location: [facility];  Service: General;  Laterality: N/A;  . COLON SURGERY  02/2013  . COLON SURGERY  10/2015  . FEMORAL-POPLITEAL BYPASS GRAFT Right 08/11/2013   Procedure:  RIGHT - POPLITEAL TO PERONEAL ARTERY BYPASS GRAFT  WITH NONREVERSED SAPHENOUS VEIN GRAFT,tHROMBECTOMY ANTERIOR TIBIALIS,ATTEMPTED THROMBECTOMY TIBIO-PERONEAL TRUNK AND POSTERIOR TIBIALIS, INTRAOPERATIVE ARTERIOGRAM.;  Surgeon: Mal Misty, MD;  Location: [place];  Service: Vascular;  Laterality: Right;  . FEMORAL-TIBIAL BYPASS GRAFT Right 11/17/2013   Procedure: BYPASS GRAFT RIGHT ABOVE KNEE POPLITEAL TO POSTERIOR TIBIAL ARTERY USING RIGHT NON-REVERSED CEPHALIC VEIN;  Surgeon: Mal Misty, MD;  Location: [facility];  Service: Vascular;  Laterality: Right;  . HERNIA REPAIR   ~ 1995; 03/2013   UHR  . INTRAOPERATIVE ARTERIOGRAM Right 11/17/2013   Procedure: INTRA OPERATIVE ARTERIOGRAM;  Surgeon: Mal Misty, MD;  Location: [facility];  Service: Vascular;  Laterality: Right;  . IR GENERIC HISTORICAL  12/11/2015   IR RADIOLOGIST EVAL & MGMT 12/11/2015 Arne Cleveland, MD [facility]  . IR GENERIC HISTORICAL  12/24/2015   IR RADIOLOGIST EVAL & MGMT 12/24/2015 Jacqulynn Cadet, MD [facility]  . IR GENERIC HISTORICAL  01/21/2016   IR RADIOLOGIST EVAL & MGMT 01/21/2016 [name], MD [facility]  . IR GENERIC HISTORICAL  02/04/2016   IR RADIOLOGIST EVAL & MGMT 02/04/2016 Sandi Mariscal, MD [facility]  . IR GENERIC HISTORICAL  02/12/2016   IR CATHETER TUBE CHANGE 02/12/2016 Corrie Mckusick, DO [facility]  . IR GENERIC HISTORICAL  02/20/2016   IR RADIOLOGIST EVAL & MGMT 02/20/2016 Markus Daft, MD [facility]  . LAPAROSCOPIC RIGHT HEMI COLECTOMY N/A 03/28/2013   Procedure: LAPAROSCOPIC ASSISTED HEMI COLECTOMY;  Surgeon: Pedro Earls, MD;  [facility];  Service: General;  Laterality: N/A;  . LAPAROSCOPY N/A 11/05/2015   Procedure: LAPAROSCOPY DIAGNOSTIC;  Surgeon: [name], MD;  Location: [facility];  Service: General;  Laterality: N/A;  . LAPAROTOMY N/A 11/05/2015   Procedure: EXPLORATORY LAPAROTOMY, resection of mass at tail of pancreas;  Surgeon: [name], MD;  Location[facility];  Service: General;  Laterality: N/A;  . LOWER EXTREMITY ANGIOGRAM Bilateral 08/09/2013  . TEE WITHOUT CARDIOVERSION N/A 08/10/2013   Procedure: TRANSESOPHAGEAL ECHOCARDIOGRAM (TEE);  Surgeon: Candee Furbish, MD;  Location: [facility];  Service: Cardiovascular;  Laterality: N/A;  . TONSILLECTOMY  ~ 1950  . [place]; 03/2013  . VASECTOMY    . VEIN HARVEST Right 11/17/2013   Procedure: HARVEST OF RIGHT UPPER EXTREMITY CEPHALIC VEIN;  Surgeon: Mal Misty, MD;  Location: [place];  Service: Vascular;  Laterality: Right;    Allergies  Allergen  Reactions  . Amlodipine Swelling    Right LE edema  . Gabapentin Rash    Short-term memory decrease    Current Outpatient Medications  Medication Sig Dispense Refill  . acetaminophen (TYLENOL) 500 MG tablet Take 500 mg by mouth every 6 (six) hours as needed.    . colestipol (COLESTID) 1 g tablet TAKE 1 TABLET BY MOUTH 1 TO 2 TIMES A DAY, NOT WITHIN AN HOUR OF OTHER MEDS  3  . doxylamine, Sleep, (UNISOM) 25 MG tablet Take 25 mg by mouth at bedtime as needed.    Marland Kitchen FLUZONE HIGH-DOSE 0.5 ML injection TO BE ADMINISTERED BY PHARMACIST FOR IMMUNIZATION  0  . LIALDA 1.2 g EC tablet TAKE 4 TABLETS EVERY MORNING  5  . losartan (COZAAR) 100 MG tablet Take 1 tablet (100 mg total) by mouth daily. (Patient taking differently: Take 50 mg by mouth daily. ) 90 tablet 3  . Multiple Vitamin (MULTIVITAMIN WITH MINERALS) TABS tablet Take 1 tablet by mouth daily.    Marland Kitchen  rosuvastatin (CRESTOR) 10 MG tablet Take 10 mg by mouth daily.    Marland Kitchen spironolactone (ALDACTONE) 25 MG tablet Take 25 mg by mouth daily.    Marland Kitchen torsemide (DEMADEX) 20 MG tablet Take 1 tablet (20 mg total) by mouth 2 (two) times daily as needed. 180 tablet 3  . UCERIS 9 MG TB24 Take 1 tablet by mouth daily.  5  . XARELTO 20 MG TABS tablet TAKE 1 TABLET DAILY WITH BREAKFAST 30 tablet 11   Current Facility-Administered Medications  Medication Dose Route Frequency Provider Last Rate Last Dose  . sodium chloride 0.9 % injection 10 mL  10 mL Intracatheter TID Markus Daft, MD        ROS: See HPI for pertinent positives and negatives.   Physical Examination  Vitals:   12/24/16 1002 12/24/16 1004  BP: 130/69 126/73  Pulse: 89   Resp: 18   Temp: (!) 97.1 F (36.2 C)   TempSrc: Oral   SpO2: 100%   Weight: 172 lb 3.2 oz (78.1 kg)    Body mass index is 24.71 kg/m.  General: A&O x 3, WDWN, male. Gait: slow, steady Eyes: PERRLA. Pulmonary: Respirations are non labored, CTAB, good air movement in all fields. Cardiac: regular rhythm and rate, no  detected murmur.    Carotid Bruits Right Left   Negative Negative   Aorta is notpalpable. Radial pulses: 2+ palpable and =  VASCULAR EXAM: Extremitieswithoutischemic changes, withoutGangrene; withoutopen wounds.  LE Pulses Right Left  FEMORAL 2+palpable 2+palpable   POPLITEAL notpalpable  notpalpable  POSTERIOR TIBIAL notpalpable  notpalpable   DORSALIS PEDIS ANTERIOR TIBIAL notpalpable  1+palpable    Abdomen: soft, NT, no palpable masses.  Skin: no rashes, no ulcers noted. Musculoskeletal: no muscle wasting or atrophy. Neurologic: A&O X 3; appropriate affect, sensation is normal; MOTOR FUNCTION: moving all extremities equally, motor strength 5/5 throughout. Speech is fluent/normal. CN 2-12 intact      ASSESSMENT: Cody Suarez is a 71 y.o. male who is s/p failed right popliteal-peroneal artery bypass graft on 08-09-13 and failed right popliteal to posterior tibial arterial bypass graft on 11-17-13. This was due to his small diseased tibial vessels but fortunately he has maintained limb salvage.   He was walking 8 hours/day on his job until he developed recurrent colon cancer in October 2017, had several major setbacks, with significant weight loss and weakness.  His walking is now limited by his right knee pain, he feels improved since his left flank drain was removed and seems to be walking more. He has no signs of ischemia in his feet/legs.  Pt states he does not want any more surgery, as he has been through so much. He is feeling well lately.   Fortunately he does not have DM,has never used tobacco, has a normal BMI, and stays active. His atherosclerotic risk factors include history of colon cancer and forgot to take his prescribed statin; I advised him to take his prescribed statin.  His bilateral ABI have declined.    DATA  ABI (Date: 12/24/2016):  R:   ABI:  0.49 (was 0.59 on 08-07-16),   PT: mono  DP: mono  TBI:  0.26 (was 0.42)  L:   ABI: 0.56 (was 0.75),   PT: mono  DP: mono  TBI: 0.29 (was 0.46)  Decline in bilateral ABI with all monophasic waveforms. Severe disease in the right, moderate disease in the left leg.    PLAN:  Resume taking daily statin since he had no adverse  effects.  Continue graduated walking program. Daily seated leg exercises discussed and demonstrated.  Based on the patient's vascular studies and examination, pt will return to clinic in 6 monthswith ABI's. I advised pt and wife to notify us if he develops concerns re the circulation in his feet/legs.      I discussed in depth with the patient the nature of atherosclerosis, and emphasized the importance of maximal medical management including strict control of blood pressure, blood glucose, and lipid levels, obtaining regular exercise, and continued cessation of smoking.  The patient is aware that without maximal medical management the underlying atherosclerotic disease process will progress, limiting the benefit of any interventions.  The patient was given information about PAD including signs, symptoms, treatment, what symptoms should prompt the patient to seek immediate medical care, and risk reduction measures to take.  Clemon Chambers, RN, MSN, FNP-C [facility] Phone: [phone]  Clinic MD: [facility]  12/24/16 10:31 AM

## 2016-12-28 ENCOUNTER — Telehealth: Payer: Self-pay | Admitting: Oncology

## 2016-12-28 ENCOUNTER — Other Ambulatory Visit (HOSPITAL_BASED_OUTPATIENT_CLINIC_OR_DEPARTMENT_OTHER): Payer: 59

## 2016-12-28 ENCOUNTER — Ambulatory Visit (HOSPITAL_BASED_OUTPATIENT_CLINIC_OR_DEPARTMENT_OTHER): Payer: 59 | Admitting: Oncology

## 2016-12-28 VITALS — BP 147/70 | HR 88 | Resp 18 | Ht 70.0 in | Wt 168.9 lb

## 2016-12-28 DIAGNOSIS — I739 Peripheral vascular disease, unspecified: Secondary | ICD-10-CM | POA: Diagnosis not present

## 2016-12-28 DIAGNOSIS — C184 Malignant neoplasm of transverse colon: Secondary | ICD-10-CM | POA: Diagnosis not present

## 2016-12-28 DIAGNOSIS — I10 Essential (primary) hypertension: Secondary | ICD-10-CM

## 2016-12-28 NOTE — Progress Notes (Signed)
[facility] OFFICE PROGRESS NOTE   Diagnosis: Colon cancer  INTERVAL HISTORY:   Cody Suarez returns as scheduled.  He feels well.  Good appetite.  He has frequent bowel movements in the mornings.  He is followed by Dr. Watt Climes for management of inflammatory bowel disease.  No abdominal pain.  No bleeding.  He continues follow-up with vascular surgery for management of peripheral vascular disease.  Objective:  Vital signs in last 24 hours:  Blood pressure (!) 147/70, pulse 88, resp. rate 18, height 5' 10"  (1.778 m), weight 168 lb 14.4 oz (76.6 kg), SpO2 100 %.    HEENT: Neck without mass Lymphatics: No cervical, supraclavicular, axillary, or inguinal nodes Resp: Lungs clear bilaterally Cardio: Regular rate and rhythm GI: No hepatosplenomegaly, no mass, nontender Vascular: Trace edema at the right greater than left lower leg    Lab Results:    Lab Results  Component Value Date   CEA1 2.24 07/01/2016   CEA1 2.7 07/01/2016     Medications: I have reviewed the patient's current medications.   Assessment/Plan: 1. 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 Negative for a BRAF mutation  Tumor invaded through the muscularis propria into pericolonic fatty tissue and involved the attached omentum.   Cycle 1 adjuvant Xeloda 05/28/2013.   Cycle 2 adjuvant Xeloda 06/18/2013.   Cycle 3 adjuvant Xeloda 07/09/2013.   Cycle 4 adjuvant Xeloda 07/30/2013.   Cycle 5 adjuvant Xeloda 08/20/2013.  Cycle 6 adjuvant Xeloda 09/11/2013.  cycle 7 adjuvant Xeloda 10/02/2013  Cycle 8 adjuvant Xeloda 10/23/2013  CTs of the chest, abdomen, and pelvis on 07/18/2014-negative for recurrent colon cancer   CTs abdomen/pelvis 09/06/2014 showed acute cholecystitis.  Colonoscopy 08/02/2015-chronic  colitis, no malignancy  CTs 09/17/2015, new mass near the tail the pancreas, stable lung nodules  Biopsy mesenteric mass 10/04/2015 with metastatic adenocarcinoma consistent with colorectal primary  PET scan 10/10/2015 with soft tissue thickening within the peritoneal space of the left upper quadrant with SUV Max 7.9; no additional sites of peritoneal nodularity to suggest metastasis; hypermetabolic ill-defined nodule in the left lower lobe favored inflammatory.  Resection of the left abdominal mesenteric mass/tail the pancreas on 11/05/2015 with the pathology confirming metastatic colon cancer, positive "serosal" margin  CTs 07/29/2016-no evidence of metastatic disease, stable small lung nodules, decreased size of pancreatic resection bed fluid/mass 2. Ulcerative colitis-extensive chronic active ulcerative colitis was noted on the colon resection specimen 03/28/2013.  Followed by Dr.Magod 3. Hypertension.  4. History of microcytic anemia-likely iron deficiency, unable to tolerate oral iron. Improved. 5. Possible area of cecal wall thickening noted on abdominal CT 03/20/2013. 6. Family history of colon cancer (maternal grandfather died in his 67s with colon cancer). 7. Bilateral calf and low anterior leg pain. Bilateral lower extremity venous duplex negative for DVT 07/14/2013. Right ABI with moderate, borderline severe arterial insufficiency; left ABI suggestive of moderate arterial insufficiency. He was referred to vascular. 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 TEE showed thrombus in the descending aorta. He underwent right popliteal to peroneal artery bypass graft, thrombectomy anterior tibialis and attempted thrombectomy tibioperoneal trunk and posterior tibialis on 08/11/2013. Right calf pain 09/26/2013 with findings of occlusion of right popliteal to peroneal bypass status post thrombolysis. Now on anticoagulation with Xarelto.  recurrent pain and ulceration at  the right foot, status post a popliteal to posterior tibial artery bypass  using a cephalic vein graft on 78/93/8101 8. Status post laparoscopic cholecystectomy 09/08/2014 9. Anorexia following the mesenteric mass/pancreas tail resection-improved 10. Abdominal abscess November 2017-pancreas "cysts "drainage catheter placed 11/26/2015; CT 02/04/2016 with no change to slight decreasein size of residual pancreatic tail fluid collection.Drainage catheter removed approximately early March 2018  CT 02/20/2016-new small left pleural effusion, increased size of the surgical bed fluid collection, indeterminate pulmonary nodules  CT 07/29/2016-decreased fluid/soft tissue density near the pancreas is no evidence of metastatic disease, stable subcentimeter lung nodules   Disposition: Mr. Westrup is in clinical remission from colon cancer.  We will follow-up on the CEA from today.  He will return for an office visit and restaging CTs in 6 months.  15 minutes were spent with the patient today.  The majority of the time was used for counseling and coordination of care.  Betsy Coder, MD  12/28/2016  2:41 PM

## 2016-12-28 NOTE — Telephone Encounter (Signed)
Gave avs and calendar for june

## 2016-12-28 NOTE — Addendum Note (Signed)
Addended by: Lianne Cure A on: 12/28/2016 10:21 AM   Modules accepted: Orders

## 2016-12-30 ENCOUNTER — Telehealth: Payer: Self-pay

## 2016-12-30 LAB — CEA (IN HOUSE-CHCC): CEA (CHCC-IN HOUSE): 3.24 ng/mL (ref 0.00–5.00)

## 2016-12-30 NOTE — Telephone Encounter (Signed)
-----   Message from [name], MD sent at 12/30/2016  9:20 AM EST ----- Please call patient, CEA is normal, follow-up as scheduled Prefers to be called on home phone

## 2016-12-30 NOTE — Telephone Encounter (Signed)
Notified of message below

## 2017-01-12 ENCOUNTER — Telehealth: Payer: Self-pay | Admitting: *Deleted

## 2017-01-12 NOTE — Telephone Encounter (Signed)
Patient and his daughter, Cody Suarez, have been notified that his patient assistance application will be ready for pick up Thursday 1/10 and to ask for [facility]. The patient stated that it was permissible to talk to his daughter Cody Suarez. She is not listed on the dpr. They verbalized their understanding.

## 2017-01-14 ENCOUNTER — Telehealth: Payer: Self-pay | Admitting: *Deleted

## 2017-01-14 NOTE — Telephone Encounter (Signed)
Patient called and made aware that his Xarelto assistance paperwork would be available next Tuesday.   Samples have been made available at the front for him. He has verbalized his understanding.  Medication Samples have been provided to the patient.  Drug name: Xarelto         Strength: 20 mg          Qty: 3 bottles   LOT: [id number]   Exp.Date: 2/21

## 2017-01-18 ENCOUNTER — Other Ambulatory Visit: Payer: Self-pay | Admitting: *Deleted

## 2017-01-18 MED ORDER — RIVAROXABAN 20 MG PO TABS: 20.0000 mg | ORAL_TABLET | Freq: Every day | ORAL | 11 refills | Status: DC

## 2017-02-09 ENCOUNTER — Encounter (HOSPITAL_COMMUNITY): Payer: 59

## 2017-02-09 ENCOUNTER — Telehealth: Payer: Self-pay | Admitting: Cardiovascular Disease

## 2017-02-09 ENCOUNTER — Ambulatory Visit: Payer: 59 | Admitting: Family

## 2017-02-09 NOTE — Telephone Encounter (Addendum)
Returned call to patient of Dr. Fletcher Anon who reports his patient assistance was denied by [facility]. He does not meet the financial criteria and has an insurance supplement.   Will route to [place], Therapist, sports as Juluis Rainier

## 2017-02-09 NOTE — Telephone Encounter (Signed)
New message ° ° ° °Patient calling the office for samples of medication: ° ° °1.  What medication and dosage are you requesting samples for?rivaroxaban (XARELTO) 20 MG TABS tablet ° °2.  Are you currently out of this medication? yes ° ° °

## 2017-02-09 NOTE — Telephone Encounter (Signed)
Patient called and made aware there are no samples at [facility]. Advised would call [employer] for samples. He has about 4-5 days left.

## 2017-02-09 NOTE — Telephone Encounter (Signed)
New message   Pt says that he is calling to check on his request for assistance with his Xarelto, says [employer] and [employer] declined his request. Please call

## 2017-02-09 NOTE — Telephone Encounter (Signed)
Spoke with [facility] @ [address]. Samples can be provided. Patient aware

## 2017-02-17 DIAGNOSIS — K5190 Ulcerative colitis, unspecified, without complications: Secondary | ICD-10-CM | POA: Diagnosis not present

## 2017-02-23 NOTE — Telephone Encounter (Signed)
Patient called and made aware that samples are avaiable at the front.  Medication Samples have been provided to the patient.  Drug name: Xarelto       Strength: 20 mg        Qty: 4 bottles  LOT: [id number]  Exp.Date: 2/21

## 2017-03-03 ENCOUNTER — Telehealth: Payer: Self-pay | Admitting: Cardiovascular Disease

## 2017-03-03 NOTE — Telephone Encounter (Signed)
Patient calling,  Pt c/o medication issue:  1. Name of Medication: Losartan  2. How are you currently taking this medication (dosage and times per day)? 100 mg //1 tablet daily   3. Are you having a reaction (difficulty breathing--STAT)? no  4. What is your medication issue? Patient states that he was watching the news about a recall on medication and would like to know what to do.

## 2017-03-03 NOTE — Telephone Encounter (Signed)
Returned call to patient and explained that only certain manufacturers of losartan (certain doses, NDC, lot #s) have been recalled. Advised he call the dispensing pharmacy and have them look up his medication to determine if he received a prescription that was on recall.

## 2017-03-18 ENCOUNTER — Telehealth: Payer: Self-pay | Admitting: Cardiovascular Disease

## 2017-03-18 NOTE — Telephone Encounter (Signed)
Patient calling, states that he would to discuss his Xarelto medication

## 2017-03-18 NOTE — Telephone Encounter (Signed)
Returned the call to the patient. He stated that he is having a hard time affording his Xarelto. He has been denied by Wynetta Emery and [employer]. He would like to know what other options may be available to him for assistance. Message routed to pharmd for their possible recommendations.  Medication Samples have been provided to the patient.  Drug name: Xarelto       Strength: 20 mg        Qty: 4 bottles  LOT: [id number]  Exp.Date: 02/21

## 2017-03-18 NOTE — Telephone Encounter (Signed)
Current available options include: Eliquis, Pradaxa  and Warfarin  Eliquis and Pradaxa may be expensive as well but co-pay is determined by insurance (affected by: deductibles, patient coverage, and tier preference). We can send a prescription to prefer pharmacy to determine co-pay or patient may contact insurance for additional co-pay information.  Warfarin is an inexpensive alternative but requires weekly monitoring during titration. If patient prefers this alternative, please schedule to next available for transition from xarelto to warfarin.

## 2017-03-19 NOTE — Telephone Encounter (Signed)
Returned the call to the patient. He stated that he will stay with the Xarelto for the time being.

## 2017-04-12 DIAGNOSIS — K5190 Ulcerative colitis, unspecified, without complications: Secondary | ICD-10-CM | POA: Diagnosis not present

## 2017-04-15 ENCOUNTER — Telehealth: Payer: Self-pay | Admitting: Cardiovascular Disease

## 2017-04-15 NOTE — Telephone Encounter (Signed)
New Message:    Pt called and wanted to speak to Cody Suarez.I told him she was not here today,he said he would wait to talk to her,he did not want another nurse to help him.

## 2017-04-16 NOTE — Telephone Encounter (Signed)
Patient made aware that samples are available at the front.  Medication Samples have been provided to the patient.  Drug name: Xarelto       Strength: 20 mg         Qty: 4 botttles LOT: [id number]  Exp.Date: 2/21

## 2017-05-05 ENCOUNTER — Ambulatory Visit (INDEPENDENT_AMBULATORY_CARE_PROVIDER_SITE_OTHER): Payer: Medicare Other | Admitting: Family Medicine

## 2017-05-05 ENCOUNTER — Encounter: Payer: Self-pay | Admitting: Family Medicine

## 2017-05-05 VITALS — BP 132/78 | HR 107 | Temp 98.2°F | Resp 18 | Ht 70.0 in | Wt 178.0 lb

## 2017-05-05 DIAGNOSIS — N529 Male erectile dysfunction, unspecified: Secondary | ICD-10-CM | POA: Diagnosis not present

## 2017-05-05 DIAGNOSIS — K5190 Ulcerative colitis, unspecified, without complications: Secondary | ICD-10-CM

## 2017-05-05 DIAGNOSIS — I10 Essential (primary) hypertension: Secondary | ICD-10-CM | POA: Diagnosis not present

## 2017-05-05 MED ORDER — SILDENAFIL CITRATE 50 MG PO TABS: ORAL_TABLET | ORAL | 3 refills | Status: DC

## 2017-05-05 NOTE — Assessment & Plan Note (Signed)
Chronic problem.  Following w/ Dr Fletcher Anon.  Well controlled today.  Asymptomatic.  Will follow along and assist as needed.

## 2017-05-05 NOTE — Assessment & Plan Note (Signed)
Chronic problem.  Following w/ Dr Watt Climes.  Currently asymptomatic.  Will follow along and assist as needed.

## 2017-05-05 NOTE — Assessment & Plan Note (Signed)
New to provider, pt has hx of similar.  Will restart Viagra as pt has done well on this previously.  Pt asking for generic sildenafil to [place]- prescription sent.

## 2017-05-05 NOTE — Patient Instructions (Addendum)
Schedule your Medicare Wellness Visit at your convenience and a follow up with me in 6 months START the Sildenafil as directed.  Start w/ 1 tablet and increase to 2 tabs if needed Call with any questions or concerns I'm SO glad that you are doing well!! Happy Spring!

## 2017-05-05 NOTE — Progress Notes (Signed)
   Subjective:    Patient ID: Cody Suarez, male    DOB: 09-Mar-1945, 72 y.o.   MRN: [id number]  HPI Erectile dysfxn- pt has taken Viagra previously.  Pt is able to achieve partial erection but not able to sustain this for any meaningful activity.    HTN- chronic problem, adequate control on Spironolactone and Losartan.  Following w/ Dr Fletcher Anon.  Denies CP, SOB.  + R LE edema.  UC- chronic problem, on Colestipol, Prednisone, and Sulfasalazine per Dr Watt Climes. Currently well controlled.  Denies abd pain, N/V/D.   Review of Systems For ROS see HPI     Objective:   Physical Exam  Constitutional: He is oriented to person, place, and time. He appears well-developed and well-nourished. No distress.  HENT:  Head: Normocephalic and atraumatic.  Eyes: Pupils are equal, round, and reactive to light. Conjunctivae and EOM are normal.  Neck: Normal range of motion. Neck supple. No thyromegaly present.  Cardiovascular: Normal rate, regular rhythm, normal heart sounds and intact distal pulses.  No murmur heard. Pulmonary/Chest: Effort normal and breath sounds normal. No respiratory distress.  Abdominal: Soft. Bowel sounds are normal. He exhibits no distension.  Musculoskeletal: He exhibits no edema.  Lymphadenopathy:    He has no cervical adenopathy.  Neurological: He is alert and oriented to person, place, and time. No cranial nerve deficit.  Skin: Skin is warm and dry.  Psychiatric: He has a normal mood and affect. His behavior is normal.  Vitals reviewed.         Assessment & Plan:

## 2017-05-18 ENCOUNTER — Telehealth: Payer: Self-pay | Admitting: Cardiovascular Disease

## 2017-05-18 NOTE — Telephone Encounter (Signed)
Patient made aware that samples are available at the front   Medication samples have been provided to the patient.  Drug name: Xarelto      Strength: 20 mg        Qty: 4 bottles LOT: [id number] Exp.Date: 04/21

## 2017-05-18 NOTE — Telephone Encounter (Signed)
New Message:   Please call, pt said he only wanted to speak to you.

## 2017-06-10 DIAGNOSIS — Z85038 Personal history of other malignant neoplasm of large intestine: Secondary | ICD-10-CM | POA: Diagnosis not present

## 2017-06-10 DIAGNOSIS — K5190 Ulcerative colitis, unspecified, without complications: Secondary | ICD-10-CM | POA: Diagnosis not present

## 2017-06-14 ENCOUNTER — Telehealth: Payer: Self-pay | Admitting: Cardiovascular Disease

## 2017-06-14 NOTE — Telephone Encounter (Signed)
New Message    Patient is calling to request the nurse contact him back. I asked for details he said its about medication. I advised if its for refills. He states that it was something that they have been working on and he just wants her to call. Please call pateint

## 2017-06-14 NOTE — Telephone Encounter (Signed)
Patient made aware that samples are at the front:  Medication samples have been provided to the patient.  Drug name: Xarelto       Strength: 20 mg        Qty: 4 bottles  LOT: [id number]  Exp.Date: 4/21

## 2017-06-28 ENCOUNTER — Ambulatory Visit
Admission: RE | Admit: 2017-06-28 | Discharge: 2017-06-28 | Disposition: A | Discharge status: Home / Self Care | Payer: Medicare Other | Source: Ambulatory Visit | Attending: Oncology | Admitting: Oncology

## 2017-06-28 ENCOUNTER — Inpatient Hospital Stay: Payer: Medicare Other

## 2017-06-28 DIAGNOSIS — C184 Malignant neoplasm of transverse colon: Secondary | ICD-10-CM | POA: Diagnosis not present

## 2017-06-28 DIAGNOSIS — R911 Solitary pulmonary nodule: Secondary | ICD-10-CM | POA: Diagnosis not present

## 2017-06-28 DIAGNOSIS — M799 Soft tissue disorder, unspecified: Secondary | ICD-10-CM | POA: Insufficient documentation

## 2017-06-28 DIAGNOSIS — I10 Essential (primary) hypertension: Secondary | ICD-10-CM | POA: Insufficient documentation

## 2017-06-28 MED ORDER — IOPAMIDOL (ISOVUE-300) INJECTION 61%
100.0000 mL | Freq: Once | INTRAVENOUS | Status: AC | PRN
  Administered 2017-06-28: 100 mL via INTRAVENOUS

## 2017-06-29 ENCOUNTER — Ambulatory Visit: Payer: 59 | Admitting: Family

## 2017-06-29 ENCOUNTER — Encounter (HOSPITAL_COMMUNITY): Payer: 59

## 2017-06-29 ENCOUNTER — Telehealth: Payer: Self-pay | Admitting: Oncology

## 2017-06-29 ENCOUNTER — Inpatient Hospital Stay: Payer: Medicare Other | Attending: Oncology | Admitting: Oncology

## 2017-06-29 VITALS — BP 161/77 | HR 103 | Temp 98.2°F | Resp 17 | Ht 70.0 in | Wt 178.9 lb

## 2017-06-29 DIAGNOSIS — C184 Malignant neoplasm of transverse colon: Secondary | ICD-10-CM

## 2017-06-29 DIAGNOSIS — I10 Essential (primary) hypertension: Secondary | ICD-10-CM | POA: Diagnosis not present

## 2017-06-29 DIAGNOSIS — M799 Soft tissue disorder, unspecified: Secondary | ICD-10-CM

## 2017-06-29 DIAGNOSIS — R918 Other nonspecific abnormal finding of lung field: Secondary | ICD-10-CM | POA: Diagnosis not present

## 2017-06-29 NOTE — Progress Notes (Signed)
[place] OFFICE PROGRESS NOTE   Diagnosis: Colon cancer  INTERVAL HISTORY:   Cody Suarez returns for a scheduled visit.  He feels well.  Good appetite.  No diarrhea or bleeding.  He reports occasional pressure in the left upper abdomen after eating meals.  This is relieved when he passes gas.  Objective:  Vital signs in last 24 hours:  Blood pressure (!) 161/77, pulse (!) 103, temperature 98.2 F (36.8 C), temperature source Oral, resp. rate 17, height _0  (1.778 m), weight 178 lb 14.4 oz (81.1 kg), SpO2 99 %.    HEENT: Neck without mass Lymphatics: No cervical, supraclavicular, axillary, or inguinal nodes Resp: Lungs clear bilaterally Cardio: Regular rate and rhythm GI: No hepatosplenomegaly, no mass, nontender Vascular: No leg edema   Lab Results:  Lab Results  Component Value Date   WBC 14.3 (H) 10/22/2016   HGB 12.8 (L) 10/22/2016   HCT 38.3 10/22/2016   MCV 80 10/22/2016   PLT 485 (H) 10/22/2016   NEUTROABS 18.4 (H) 11/26/2015    CMP  Lab Results  Component Value Date   NA 140 11/05/2016   K 4.0 11/05/2016   CL 95 (L) 11/05/2016   CO2 25 11/05/2016   GLUCOSE 179 (H) 11/05/2016   BUN 15 11/05/2016   CREATININE 1.15 11/05/2016   CALCIUM 9.7 11/05/2016   PROT 6.7 10/22/2016   ALBUMIN 3.7 10/22/2016   AST 18 10/22/2016   ALT 12 10/22/2016   ALKPHOS 47 10/22/2016   BILITOT 0.2 10/22/2016   GFRNONAA 64 11/05/2016   GFRAA 74 11/05/2016    Lab Results  Component Value Date   CEA1 3.24 12/28/2016    Lab Results  Component Value Date   INR 1.59 11/26/2015    Imaging:  Ct Chest W Contrast  Result Date: 06/28/2017 CLINICAL DATA:  Patient with history of colon cancer. Follow-up exam. EXAM: CT CHEST, ABDOMEN, AND PELVIS WITH CONTRAST TECHNIQUE: Multidetector CT imaging of the chest, abdomen and pelvis was performed following the standard protocol during bolus administration of intravenous contrast. Creatinine was obtained on site at  [place] at [address] Results: Creatinine 1.1 mg/dL. CONTRAST:  153m ISOVUE-300 IOPAMIDOL (ISOVUE-300) INJECTION 61% COMPARISON:  CT CAP 07/29/2016. FINDINGS: CT CHEST FINDINGS Cardiovascular: Normal heart size. Thoracic aortic vascular calcifications. No pericardial effusion. Mediastinum/Nodes: No enlarged axillary, mediastinal or hilar lymphadenopathy. Normal esophagus. Lungs/Pleura: Central airways are patent. Dependent atelectasis/scarring left lower lobe. Unchanged 5 mm right middle lobe nodule (image 70; series 6). Unchanged 4 mm left lower lobe nodule (image 111; series 6). Stable 3 mm nodule in the lingula (image 57; series 6). Stable 3 mm left lower lobe nodule (image 95; series 6). No pleural effusion or pneumothorax. Musculoskeletal: Thoracic spine degenerative changes. No aggressive or acute appearing osseous lesions. CT ABDOMEN PELVIS FINDINGS Hepatobiliary: Liver is normal in size and contour. No focal lesion is identified. Gallbladder is unremarkable. No intrahepatic or extrahepatic biliary ductal dilatation. Pancreas: Postsurgical changes compatible with distal pancreatectomy. When compared to prior exam there is decreased fluid and increased soft tissue at the level of the distal pancreatectomy extending to the splenic hilum. Overall this is difficult to measure given differences in morphology. On the coronal image this measures 4.0 x 3.4 cm (image 84; series 3), previously 3.4 x 3.8 cm. Additionally, within the anterior aspect of this soft tissue mass there are a few foci of gas (image 51; series 2) located between the mass and the greater curvature of the  stomach. Spleen: Unremarkable. Adrenals/Urinary Tract: Adrenal glands are normal. Kidneys enhance symmetrically with contrast. 8 mm stone inferior pole right kidney. Stomach/Bowel: Normal morphology of the stomach. No evidence for small bowel obstruction. Nonobstructed small bowel containing ventral abdominal wall hernia  (image 85; series 2). Colon is unremarkable. Vascular/Lymphatic: Normal caliber abdominal aorta. Peripheral calcified atherosclerotic plaque. No retroperitoneal lymphadenopathy. Reproductive: Prostate is unremarkable. Other: Small bilateral fat containing inguinal hernias. Musculoskeletal: Lumbar spine degenerative changes. No aggressive or acute appearing osseous lesions. IMPRESSION: 1. There is increased soft tissue within the left upper quadrant at the distal pancreatectomy bed. This is difficult to measure given the overall configuration however this appears to be increased in size along with increased soft tissue components. Findings are concerning for the possibility of recurrence at this location. 2. There are a few focal areas of gas within the anterior aspect of the irregular soft tissue in the left upper quadrant, located between the soft tissue mass and the greater curvature of the stomach. Fistulous connection is not entirely excluded. 3. Stable findings in the chest. Electronically Signed   By: Lovey Newcomer M.D.   On: 06/28/2017 17:30   Ct Abdomen Pelvis W Contrast  Result Date: 06/28/2017 CLINICAL DATA:  Patient with history of colon cancer. Follow-up exam. EXAM: CT CHEST, ABDOMEN, AND PELVIS WITH CONTRAST TECHNIQUE: Multidetector CT imaging of the chest, abdomen and pelvis was performed following the standard protocol during bolus administration of intravenous contrast. Creatinine was obtained on site at [place] at [address] Results: Creatinine 1.1 mg/dL. CONTRAST:  146m ISOVUE-300 IOPAMIDOL (ISOVUE-300) INJECTION 61% COMPARISON:  CT CAP 07/29/2016. FINDINGS: CT CHEST FINDINGS Cardiovascular: Normal heart size. Thoracic aortic vascular calcifications. No pericardial effusion. Mediastinum/Nodes: No enlarged axillary, mediastinal or hilar lymphadenopathy. Normal esophagus. Lungs/Pleura: Central airways are patent. Dependent atelectasis/scarring left lower lobe. Unchanged 5 mm  right middle lobe nodule (image 70; series 6). Unchanged 4 mm left lower lobe nodule (image 111; series 6). Stable 3 mm nodule in the lingula (image 57; series 6). Stable 3 mm left lower lobe nodule (image 95; series 6). No pleural effusion or pneumothorax. Musculoskeletal: Thoracic spine degenerative changes. No aggressive or acute appearing osseous lesions. CT ABDOMEN PELVIS FINDINGS Hepatobiliary: Liver is normal in size and contour. No focal lesion is identified. Gallbladder is unremarkable. No intrahepatic or extrahepatic biliary ductal dilatation. Pancreas: Postsurgical changes compatible with distal pancreatectomy. When compared to prior exam there is decreased fluid and increased soft tissue at the level of the distal pancreatectomy extending to the splenic hilum. Overall this is difficult to measure given differences in morphology. On the coronal image this measures 4.0 x 3.4 cm (image 84; series 3), previously 3.4 x 3.8 cm. Additionally, within the anterior aspect of this soft tissue mass there are a few foci of gas (image 51; series 2) located between the mass and the greater curvature of the stomach. Spleen: Unremarkable. Adrenals/Urinary Tract: Adrenal glands are normal. Kidneys enhance symmetrically with contrast. 8 mm stone inferior pole right kidney. Stomach/Bowel: Normal morphology of the stomach. No evidence for small bowel obstruction. Nonobstructed small bowel containing ventral abdominal wall hernia (image 85; series 2). Colon is unremarkable. Vascular/Lymphatic: Normal caliber abdominal aorta. Peripheral calcified atherosclerotic plaque. No retroperitoneal lymphadenopathy. Reproductive: Prostate is unremarkable. Other: Small bilateral fat containing inguinal hernias. Musculoskeletal: Lumbar spine degenerative changes. No aggressive or acute appearing osseous lesions. IMPRESSION: 1. There is increased soft tissue within the left upper quadrant at the distal pancreatectomy bed. This is difficult  to  measure given the overall configuration however this appears to be increased in size along with increased soft tissue components. Findings are concerning for the possibility of recurrence at this location. 2. There are a few focal areas of gas within the anterior aspect of the irregular soft tissue in the left upper quadrant, located between the soft tissue mass and the greater curvature of the stomach. Fistulous connection is not entirely excluded. 3. Stable findings in the chest. Electronically Signed   By: Lovey Newcomer M.D.   On: 06/28/2017 17:30    Medications: I have reviewed the patient's current medications.   Assessment/Plan: 1. 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 Negative for a BRAF mutation  Tumor invaded through the muscularis propria into pericolonic fatty tissue and involved the attached omentum.   Cycle 1 adjuvant Xeloda 05/28/2013.   Cycle 2 adjuvant Xeloda 06/18/2013.   Cycle 3 adjuvant Xeloda 07/09/2013.   Cycle 4 adjuvant Xeloda 07/30/2013.   Cycle 5 adjuvant Xeloda 08/20/2013.  Cycle 6 adjuvant Xeloda 09/11/2013.  cycle 7 adjuvant Xeloda 10/02/2013  Cycle 8 adjuvant Xeloda 10/23/2013    CTs of the chest, abdomen, and pelvis on 07/18/2014-negative for recurrent colon cancer   CTs abdomen/pelvis 09/06/2014 showed acute cholecystitis.  Colonoscopy 08/02/2015-chronic colitis, no malignancy  CTs 09/17/2015, new mass near the tail the pancreas, stable lung nodules  Biopsy mesenteric mass 10/04/2015 with metastatic adenocarcinoma consistent with colorectal primary  PET scan 10/10/2015 with soft tissue thickening within the peritoneal space of the left upper quadrant with SUV Max 7.9; no additional sites of peritoneal nodularity to suggest metastasis; hypermetabolic  ill-defined nodule in the left lower lobe favored inflammatory.  Resection of the left abdominal mesenteric mass/tail the pancreas on 11/05/2015 with the pathology confirming metastatic colon cancer, positive "serosal" margin  CTs 07/29/2016-no evidence of metastatic disease, stable small lung nodules, decreased size of pancreatic resection bed fluid/mass  CTs 06/28/2017- decreased fluid and increased soft tissue at the area of the distal pancreatectomy with a few foci of gas, 2. Ulcerative colitis-extensive chronic active ulcerative colitis was noted on the colon resection specimen 03/28/2013.  Followed by Dr.Magod 3. Hypertension.  4. History of microcytic anemia-likely iron deficiency, unable to tolerate oral iron. Improved. 5. Possible area of cecal wall thickening noted on abdominal CT 03/20/2013. 6. Family history of colon cancer (maternal grandfather died in his 3s with colon cancer). 7. Bilateral calf and low anterior leg pain. Bilateral lower extremity venous duplex negative for DVT 07/14/2013. Right ABI with moderate, borderline severe arterial insufficiency; left ABI suggestive of moderate arterial insufficiency. He was referred to vascular. 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 TEE showed thrombus in the descending aorta. He underwent right popliteal to peroneal artery bypass graft, thrombectomy anterior tibialis and attempted thrombectomy tibioperoneal trunk and posterior tibialis on 08/11/2013. Right calf pain 09/26/2013 with findings of occlusion of right popliteal to peroneal bypass status post thrombolysis. Now on anticoagulation with Xarelto.  recurrent pain and ulceration at the right foot, status post a popliteal to posterior tibial artery bypass using a cephalic vein graft on 38/88/2800 8. Status post laparoscopic cholecystectomy 09/08/2014 9. Anorexia following the mesenteric mass/pancreas tail resection-improved 10. Abdominal abscess November  2017-pancreas "cysts "drainage catheter placed 11/26/2015; CT 02/04/2016 with no change to slight decreasein size of residual pancreatic tail fluid collection.Drainage catheter removed approximately early March 2018  CT 02/20/2016-new  small left pleural effusion, increased size of the surgical bed fluid collection, indeterminate pulmonary nodules  CT 07/29/2016-decreased fluid/soft tissue density near the pancreas is no evidence of metastatic disease, stable subcentimeter lung nodules  Disposition:  Cody Suarez appears well.  There is no clinical evidence of disease progression, but the CT reveals an increased soft tissue component of the masslike area near the pancreas resection bed.  I will review his case at the GI tumor conference on 07/07/2017.  We will arrange for a biopsy of the soft tissue mass versus a repeat CT at a 21-monthinterval.  Mr. SBeranekwill be scheduled for an office visit in 3 months.  We will see him sooner pending the GI tumor conference discussion.  I reviewed the CT images with him today.  25 minutes were spent with the patient today.  The majority of the time was used for counseling and coordination of care.  GBetsy Coder MD  06/29/2017  1:06 PM

## 2017-06-29 NOTE — Telephone Encounter (Signed)
Appointments scheduled AVS/Calendar printed  Per 6/25 los

## 2017-07-01 ENCOUNTER — Telehealth: Payer: Self-pay | Admitting: Cardiovascular Disease

## 2017-07-01 NOTE — Telephone Encounter (Signed)
NEW MESSAGE    Patient is completely out of medication   1. Which medications need to be refilled? (please list name of each medication and dose if known) spironolactone (ALDACTONE) 25 MG tablet  2. Which pharmacy/location (including street and city if local pharmacy) is medication to be sent to? [facility] [place], [place]  3. Do they need a 30 day or 90 day supply? [place]

## 2017-07-02 ENCOUNTER — Other Ambulatory Visit: Payer: Self-pay | Admitting: *Deleted

## 2017-07-02 MED ORDER — SPIRONOLACTONE 25 MG PO TABS: 25.0000 mg | ORAL_TABLET | Freq: Every day | ORAL | 4 refills | Status: DC

## 2017-07-02 MED ORDER — SPIRONOLACTONE 25 MG PO TABS: 25.0000 mg | ORAL_TABLET | Freq: Every day | ORAL | 1 refills | Status: DC

## 2017-07-02 NOTE — Telephone Encounter (Signed)
Rx(s) sent to pharmacy electronically.  

## 2017-07-02 NOTE — Telephone Encounter (Signed)
Follow up    Patient calling, refill has not been sent . Patient was given a 5 day supply.

## 2017-07-06 ENCOUNTER — Encounter: Payer: Self-pay | Admitting: Cardiovascular Disease

## 2017-07-06 ENCOUNTER — Ambulatory Visit (INDEPENDENT_AMBULATORY_CARE_PROVIDER_SITE_OTHER): Payer: Medicare Other | Admitting: Cardiovascular Disease

## 2017-07-06 VITALS — BP 126/68 | HR 89 | Ht 70.0 in | Wt 180.2 lb

## 2017-07-06 DIAGNOSIS — I10 Essential (primary) hypertension: Secondary | ICD-10-CM

## 2017-07-06 DIAGNOSIS — I739 Peripheral vascular disease, unspecified: Secondary | ICD-10-CM | POA: Diagnosis not present

## 2017-07-06 DIAGNOSIS — R609 Edema, unspecified: Secondary | ICD-10-CM

## 2017-07-06 DIAGNOSIS — E785 Hyperlipidemia, unspecified: Secondary | ICD-10-CM

## 2017-07-06 NOTE — Patient Instructions (Signed)
Medication Instructions:  Your physician recommends that you continue on your current medications as directed. Please refer to the Current Medication list given to you today.  Follow-Up: Your physician wants you to follow-up in: 1 year with Dr. Fletcher Anon.  You will receive a reminder letter in the mail two months in advance. If you don't receive a letter, please call our office to schedule the follow-up appointment.    If you need a refill on your cardiac medications before your next appointment, please call your pharmacy.

## 2017-07-06 NOTE — Progress Notes (Signed)
Cardiology Office Note   Date:  07/06/2017   ID:  Cody Suarez, DOB 1945-05-07, MRN [id number]  PCP:  Midge Minium, MD  Cardiologist:   Kathlyn Sacramento, MD   No chief complaint on file.     History of Present Illness: Cody Suarez is a 72 y.o. male who presents a follow-up visit regarding  peripheral arterial disease and hypertension.  He was diagnosed with stage II colon cancer early 2015 and underwent partial colectomy in February followed by chemotherapy with Xeloda. He was seen in July 2015 for bilateral calf discomfort. Angiography was done in August 2015 which showed: 1. No significant aortoiliac disease. 2. 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 One-vessel runoff below the knee bilaterally via the peroneal artery which was occluded proximally but did reconstitute via collaterals. TEE was performed to evaluate source of embolism. It showed normal LV systolic function and wall motion. There was a mobile thrombus in the proximal decending aorta on top of an ulcerated plaque. CTA showed an ulcerated plaque in the proximal descending aorta with thrombus. He was evaluated by Dr. Trula Slade. Anticoagulation was recommended and he has been on Xarelto since then. He underwent right SFA to posterior tibial bypass by Dr. Kellie Simmering multiple times with poor outcome due to poor runoff.  He had worsening leg edema in 2018.  Echocardiogram showed normal LV systolic function with grade 1 diastolic dysfunction.  Leg edema responded well to torsemide.  He is no longer on a diuretic.  He stopped taking rosuvastatin because his cholesterol was good.  He denies any chest pain, shortness of breath or leg pain.  Past Medical History:  Diagnosis Date  . Anemia of chronic disease 2015   "related to cancer tx" (08/09/2013)  . Blood clot in vein 2015  . Colon cancer ([place]) 03/2013  . History of kidney stones    x 2passed them both    . Hypertension   . Peripheral vascular disease ([place])   . Ulcerative colitis ([place])    history    Past Surgical History:  Procedure Laterality Date  . ABDOMINAL AORTAGRAM N/A 08/09/2013   Procedure: ABDOMINAL Maxcine Ham;  Surgeon: [name], MD;  Location: [facility];  Service: Cardiovascular;  Laterality: N/A;  . CHOLECYSTECTOMY N/A 09/08/2014   Procedure: LAPAROSCOPIC CHOLECYSTECTOMY WITH INTRAOPERATIVE CHOLANGIOGRAM;  Surgeon: Armandina Gemma, MD;  [facility];  Service: General;  Laterality: N/A;  . COLON SURGERY  02/2013  . COLON SURGERY  10/2015  . FEMORAL-POPLITEAL BYPASS GRAFT Right 08/11/2013   Procedure:   RIGHT - POPLITEAL TO PERONEAL ARTERY BYPASS GRAFT  WITH NONREVERSED SAPHENOUS VEIN GRAFT,tHROMBECTOMY ANTERIOR TIBIALIS,ATTEMPTED THROMBECTOMY TIBIO-PERONEAL TRUNK AND POSTERIOR TIBIALIS, INTRAOPERATIVE ARTERIOGRAM.;  Surgeon: Mal Misty, MD;  Location: [facility];  Service: Vascular;  Laterality: Right;  . FEMORAL-TIBIAL BYPASS GRAFT Right 11/17/2013   Procedure: BYPASS GRAFT RIGHT ABOVE KNEE POPLITEAL TO POSTERIOR TIBIAL ARTERY USING RIGHT NON-REVERSED CEPHALIC VEIN;  Surgeon: Mal Misty, MD;  Location: [facility];  Service: Vascular;  Laterality: Right;  . HERNIA REPAIR  ~ 1995; 03/2013   UHR  . INTRAOPERATIVE ARTERIOGRAM Right 11/17/2013   Procedure: INTRA OPERATIVE ARTERIOGRAM;  Surgeon: Mal Misty, MD;  Location: [facility] OR;  Service: Vascular;  Laterality: Right;  . IR GENERIC HISTORICAL  12/11/2015   IR RADIOLOGIST EVAL & MGMT 12/11/2015 Arne Cleveland, MD [facility]  . IR GENERIC HISTORICAL  12/24/2015  IR RADIOLOGIST EVAL & MGMT 12/24/2015 Jacqulynn Cadet, MD [facility]  . IR GENERIC HISTORICAL  01/21/2016   IR RADIOLOGIST EVAL & MGMT 01/21/2016 [name], MD [facility]  . IR GENERIC HISTORICAL  02/04/2016   IR RADIOLOGIST EVAL & MGMT 02/04/2016 Sandi Mariscal, MD [facility]  . IR GENERIC HISTORICAL  02/12/2016   IR CATHETER TUBE CHANGE 02/12/2016  Corrie Mckusick, DO [facility]  . IR GENERIC HISTORICAL  02/20/2016   IR RADIOLOGIST EVAL & MGMT 02/20/2016 Markus Daft, MD [facility]  . LAPAROSCOPIC RIGHT HEMI COLECTOMY N/A 03/28/2013   Procedure: LAPAROSCOPIC ASSISTED HEMI COLECTOMY;  Surgeon: Pedro Earls, MD;  [facility];  Service: General;  Laterality: N/A;  . LAPAROSCOPY N/A 11/05/2015   Procedure: LAPAROSCOPY DIAGNOSTIC;  Surgeon: [name], MD;  [facility];  Service: General;  Laterality: N/A;  . LAPAROTOMY N/A 11/05/2015   Procedure: EXPLORATORY LAPAROTOMY, resection of mass at tail of pancreas;  Surgeon: [name], MD;  [facility];  Service: General;  Laterality: N/A;  . LOWER EXTREMITY ANGIOGRAM Bilateral 08/09/2013  . TEE WITHOUT CARDIOVERSION N/A 08/10/2013   Procedure: TRANSESOPHAGEAL ECHOCARDIOGRAM (TEE);  Surgeon: Candee Furbish, MD;  Location: [facility] ENDOSCOPY;  Service: Cardiovascular;  Laterality: N/A;  . TONSILLECTOMY  ~ 1950  . [place]; 03/2013  . VASECTOMY    . VEIN HARVEST Right 11/17/2013   Procedure: HARVEST OF RIGHT UPPER EXTREMITY CEPHALIC VEIN;  Surgeon: Mal Misty, MD;  Location: [facility];  Service: Vascular;  Laterality: Right;     Current Outpatient Medications  Medication Sig Dispense Refill  . acetaminophen (TYLENOL) 500 MG tablet Take 500 mg by mouth every 6 (six) hours as needed.    . colestipol (COLESTID) 1 g tablet TAKE 1 TABLET BY MOUTH 1 TO 2 TIMES A DAY, NOT WITHIN AN HOUR OF OTHER MEDS  3  . doxylamine, Sleep, (UNISOM) 25 MG tablet Take 25 mg by mouth at bedtime as needed.    Marland Kitchen losartan (COZAAR) 100 MG tablet Take 1 tablet (100 mg total) by mouth daily. (Patient taking differently: Take 50 mg by mouth daily. ) 90 tablet 3  . Multiple Vitamin (MULTIVITAMIN WITH MINERALS) TABS tablet Take 1 tablet by mouth daily.    . predniSONE (DELTASONE) 10 MG tablet Take 10 mg by mouth daily.  1  . rivaroxaban (XARELTO) 20 MG TABS tablet Take 1 tablet (20 mg total)  by mouth daily with breakfast. 30 tablet 11  . sildenafil (VIAGRA) 50 MG tablet 1-2 tabs as needed for erectile dysfunction 30 tablet 3  . spironolactone (ALDACTONE) 25 MG tablet Take 1 tablet (25 mg total) by mouth daily. KEEP OV. 30 tablet 1  . sulfaSALAzine (AZULFIDINE) 500 MG EC tablet Take 1,500 mg by mouth 2 (two) times daily.  3   No current facility-administered medications for this visit.     Allergies:   Amlodipine and Gabapentin    Social History:  The patient  reports that he has never smoked. He has never used smokeless tobacco. He reports that he does not drink alcohol or use drugs.   Family History:  The patient's family history includes Emphysema in his father; Heart disease in his mother.    ROS:  Please see the history of present illness.   Otherwise, review of systems are positive for none.   All other systems are reviewed and negative.    PHYSICAL EXAM: VS:  BP 126/68  Pulse 89   Ht 5' 10"  (1.778 m)   Wt 180 lb 3.2 oz (81.7 kg)   BMI 25.86 kg/m  , BMI Body mass index is 25.86 kg/m. GEN: Well nourished, well developed, in no acute distress  HEENT: normal  Neck: no JVD, carotid bruits, or masses Cardiac: RRR; no murmurs, rubs, or gallops, trace bilateral leg edema to the knees Respiratory:  clear to auscultation bilaterally, normal work of breathing GI: soft, nontender, nondistended, + BS MS: no deformity or atrophy  Skin: warm and dry, no rash Neuro:  Strength and sensation are intact Psych: euthymic mood, full affect   EKG:  EKG is ordered today. EKG showed normal sinus rhythm with poor R wave progression in the anterior leads.   Recent Labs: 10/22/2016: ALT 12; Hemoglobin 12.8; Platelets 485 11/05/2016: BUN 15; Creatinine, Ser 1.15; Potassium 4.0; Sodium 140    Lipid Panel    Component Value Date/Time   CHOL 132 11/05/2016 0855   TRIG 155 (H) 11/05/2016 0855   HDL 42 11/05/2016 0855   CHOLHDL 3.1 11/05/2016 0855   CHOLHDL 4.7 10/09/2015 1434    VLDL 39 (H) 10/09/2015 1434   LDLCALC 59 11/05/2016 0855      Wt Readings from Last 3 Encounters:  07/06/17 180 lb 3.2 oz (81.7 kg)  06/29/17 178 lb 14.4 oz (81.1 kg)  05/05/17 178 lb (80.7 kg)       ASSESSMENT AND PLAN:  1.  Chronic venous insufficiency: Leg swelling and edema improved with conservative measures.  He is no longer on torsemide.  2. Peripheral arterial disease with occluded tibial vessels due to embolization from ulcerative plaque in the aorta. He has no claudication at the present time. Continue medical therapy. Continue lifelong anticoagulation.  3. Essential hypertension: Blood pressure is well controlled on  losartan and spironolactone.   4. History of colon cancer: Currently in remission.  5. Hyperlipidemia: He stopped taking rosuvastatin because his cholesterol was good.  He is going for a physical in the near future and I recommend rechecking his lipid profile.  Given history of peripheral arterial disease, we should have a low threshold to resume treatment with a statin especially if his LDL is above 100.  Disposition:   FU with me in 12 months  Signed,  Kathlyn Sacramento, MD  07/06/2017 11:17 AM    [place]

## 2017-07-10 ENCOUNTER — Other Ambulatory Visit: Payer: Self-pay | Admitting: Oncology

## 2017-07-12 ENCOUNTER — Telehealth: Payer: Self-pay | Admitting: Cardiovascular Disease

## 2017-07-12 NOTE — Telephone Encounter (Signed)
Medication samples have been provided to the patient.  Drug name: Xarelto 20 mg  Qty: 4 bottles   LOT: [id number]   Exp.Date: 6/21  Samples left at front desk for patient pick-up. Patient notified.

## 2017-07-12 NOTE — Telephone Encounter (Signed)
New Message   Pt is requesting a call back from a nurse, did not want to disclose reason he says just to "call Cody Suarez". Please call

## 2017-07-19 ENCOUNTER — Encounter: Payer: Self-pay | Admitting: Family

## 2017-07-19 ENCOUNTER — Ambulatory Visit (HOSPITAL_COMMUNITY)
Admission: RE | Admit: 2017-07-19 | Discharge: 2017-07-19 | Disposition: A | Discharge status: Home / Self Care | Payer: Medicare Other | Source: Ambulatory Visit | Attending: Family | Admitting: Family

## 2017-07-19 ENCOUNTER — Ambulatory Visit (INDEPENDENT_AMBULATORY_CARE_PROVIDER_SITE_OTHER): Payer: Medicare Other | Admitting: Family

## 2017-07-19 VITALS — BP 144/84 | HR 94 | Temp 97.3°F | Resp 18 | Ht 70.0 in | Wt 179.0 lb

## 2017-07-19 DIAGNOSIS — I739 Peripheral vascular disease, unspecified: Secondary | ICD-10-CM | POA: Insufficient documentation

## 2017-07-19 DIAGNOSIS — I779 Disorder of arteries and arterioles, unspecified: Secondary | ICD-10-CM

## 2017-07-19 NOTE — Progress Notes (Signed)
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 male who is s/p failed right popliteal-peroneal artery bypass graft on 08-09-13 and failed right popliteal to posterior tibial arterial bypass graft on 11-17-13 by Dr. Kellie Simmering. This was due to his small diseased tibial vessels but fortunately he has maintained limb salvage.   He has a history of colon cancer which was treated surgically in 2015and he is followed by Dr. Julieanne Suarez in the cancer clinic. He had recurrence of cancer, of the splenic flexure, and had a laparotomy on 11-05-15 for this. He then developed an abscess near his pancreas and hada drain connected,this had been addressed, the drain has been removed. He feels much improved since the drain was removed January 2018, and is more active.   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  He denies any rest pain or nonhealing ulcers. There is no numbness in the feet. He does have claudication in both calves right worse than left if he ambulates quickly.  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  He states he walks all day, keeps busy, he has occassional right knee pain, he is on the go all day with activities.  He denies dyspnea or chest pain.  He had bouts of colitis and frequent stools, that seems to have mostly resolved with medication.  He denies any history of stroke or TIA.    Pt Diabetic: No Pt smoker: non-smoker  Pt meds include: Statin :no, states he was advised to stop taking as his cholesterol was good Betablocker: No ASA: No Other anticoagulants/antiplatelets: Xarelto     Past Medical History:  Diagnosis Date  . Anemia of chronic disease 2015    "related to cancer tx" (08/09/2013)  . Blood clot in vein 2015  . Colon cancer ([place]) 03/2013  . History of kidney stones    x 2passed them both  . Hypertension   . Peripheral vascular disease ([place])   . Ulcerative colitis ([place])    history    Social History Social History   Tobacco Use  . Smoking status: Never Smoker  . Smokeless tobacco: Never Used  . Tobacco comment: states his father died of emphysema from smoking, he did not want to be like that  Substance Use Topics  . Alcohol use: No    Alcohol/week: 1.8 oz    Types: 3 Glasses of wine per week  . Drug use: No    Family History Family History  Problem Relation Age of Onset  . Heart disease Mother   . Emphysema Father     Past Surgical History:  Procedure Laterality Date  . ABDOMINAL AORTAGRAM N/A 08/09/2013   Procedure: ABDOMINAL Maxcine Ham;  Surgeon: [name], MD;  Location: [facility] CATH [facility];  Service: Cardiovascular;  Laterality: N/A;  . CHOLECYSTECTOMY N/A 09/08/2014   Procedure: LAPAROSCOPIC CHOLECYSTECTOMY WITH INTRAOPERATIVE CHOLANGIOGRAM;  Surgeon: Armandina Gemma, MD;  [facility];  Service: General;  Laterality: N/A;  . COLON SURGERY  02/2013  . COLON SURGERY  10/2015  . FEMORAL-POPLITEAL BYPASS GRAFT Right 08/11/2013   Procedure:   RIGHT - POPLITEAL TO PERONEAL ARTERY BYPASS GRAFT  WITH NONREVERSED SAPHENOUS VEIN GRAFT,tHROMBECTOMY ANTERIOR TIBIALIS,ATTEMPTED THROMBECTOMY TIBIO-PERONEAL TRUNK  AND POSTERIOR TIBIALIS, INTRAOPERATIVE ARTERIOGRAM.;  Surgeon: Mal Misty, MD;  Location: [facility];  Service: Vascular;  Laterality: Right;  . FEMORAL-TIBIAL BYPASS GRAFT Right 11/17/2013   Procedure: BYPASS GRAFT RIGHT ABOVE KNEE POPLITEAL TO POSTERIOR TIBIAL ARTERY USING RIGHT NON-REVERSED CEPHALIC VEIN;  Surgeon: Mal Misty, MD;  Location: [facility];  Service: Vascular;  Laterality: Right;  . HERNIA REPAIR  ~ 1995; 03/2013   UHR  . INTRAOPERATIVE ARTERIOGRAM Right 11/17/2013   Procedure: INTRA OPERATIVE ARTERIOGRAM;   Surgeon: Mal Misty, MD;  Location[facility];  Service: Vascular;  Laterality: Right;  . IR GENERIC HISTORICAL  12/11/2015   IR RADIOLOGIST EVAL & MGMT 12/11/2015 Cody Cleveland, MD [facility]  . IR GENERIC HISTORICAL  12/24/2015   IR RADIOLOGIST EVAL & MGMT 12/24/2015 Cody Cadet, MD [facility]  . IR GENERIC HISTORICAL  01/21/2016   IR RADIOLOGIST EVAL & MGMT 01/21/2016 Cody Killings, MD [facility]  . IR GENERIC HISTORICAL  02/04/2016   IR RADIOLOGIST EVAL & MGMT 02/04/2016 Cody Mariscal, MD [facility]  . IR GENERIC HISTORICAL  02/12/2016   IR CATHETER TUBE CHANGE 02/12/2016 Cody Mckusick, DO [facility]  . IR GENERIC HISTORICAL  02/20/2016   IR RADIOLOGIST EVAL & MGMT 02/20/2016 Cody Daft, MD [facility]  . LAPAROSCOPIC RIGHT HEMI COLECTOMY N/A 03/28/2013   Procedure: LAPAROSCOPIC ASSISTED HEMI COLECTOMY;  Surgeon: Pedro Earls, MD;  [facility];  Service: General;  Laterality: N/A;  . LAPAROSCOPY N/A 11/05/2015   Procedure: LAPAROSCOPY DIAGNOSTIC;  Surgeon: [name], MD;  [facility];  Service: General;  Laterality: N/A;  . LAPAROTOMY N/A 11/05/2015   Procedure: EXPLORATORY LAPAROTOMY, resection of mass at tail of pancreas;  Surgeon: [name], MD;  [facility];  Service: General;  Laterality: N/A;  . LOWER EXTREMITY ANGIOGRAM Bilateral 08/09/2013  . TEE WITHOUT CARDIOVERSION N/A 08/10/2013   Procedure: TRANSESOPHAGEAL ECHOCARDIOGRAM (TEE);  Surgeon: Candee Furbish, MD;  Location: [facility] ENDOSCOPY;  Service: Cardiovascular;  Laterality: N/A;  . TONSILLECTOMY  ~ 1950  . [place]; 03/2013  . VASECTOMY    . VEIN HARVEST Right 11/17/2013   Procedure: HARVEST OF RIGHT UPPER EXTREMITY CEPHALIC VEIN;  Surgeon: Mal Misty, MD;  Location: [facility];  Service: Vascular;  Laterality: Right;    Allergies  Allergen Reactions  . Amlodipine Swelling    Right LE edema  . Gabapentin Rash    Short-term memory decrease    Current  Outpatient Medications  Medication Sig Dispense Refill  . acetaminophen (TYLENOL) 500 MG tablet Take 500 mg by mouth every 6 (six) hours as needed.    . colestipol (COLESTID) 1 g tablet TAKE 1 TABLET BY MOUTH 1 TO 2 TIMES A DAY, NOT WITHIN AN HOUR OF OTHER MEDS  3  . doxylamine, Sleep, (UNISOM) 25 MG tablet Take 25 mg by mouth at bedtime as needed.    Marland Kitchen losartan (COZAAR) 100 MG tablet Take 1 tablet (100 mg total) by mouth daily. (Patient taking differently: Take 50 mg by mouth daily. ) 90 tablet 3  . Multiple Vitamin (MULTIVITAMIN WITH MINERALS) TABS tablet Take 1 tablet by mouth daily.    . predniSONE (DELTASONE) 10 MG tablet Take 5 mg by mouth daily.   1  . rivaroxaban (XARELTO) 20 MG TABS tablet Take 1 tablet (20 mg total) by mouth daily with breakfast. 30 tablet 11  . sildenafil (VIAGRA) 50 MG tablet 1-2 tabs as needed  for erectile dysfunction 30 tablet 3  . spironolactone (ALDACTONE) 25 MG tablet Take 1 tablet (25 mg total) by mouth daily. KEEP OV. 30 tablet 1  . sulfaSALAzine (AZULFIDINE) 500 MG EC tablet Take 1,500 mg by mouth 2 (two) times daily.  3   No current facility-administered medications for this visit.     ROS: See HPI for pertinent positives and negatives.   Physical Examination  Vitals:   07/19/17 1618  BP: (!) 144/84  Pulse: 94  Resp: 18  Temp: (!) 97.3 F (36.3 C)  TempSrc: Oral  SpO2: 98%  Weight: 179 lb (81.2 kg)  Height: 5' 10"  (1.778 m)   Body mass index is 25.68 kg/m.  General: A&O x 3, WDWN, male. Gait: normal Eyes: PERRLA. Pulmonary: Respirations are non labored, CTAB, good air movement in all fields. Cardiac: regular rhythm and rate, no detected murmur.    Carotid Bruits Right Left   Negative Negative   Abdominal aortic pulse is notpalpable. Radial pulses: 2+ palpable and =  VASCULAR EXAM: Extremitieswithoutischemic changes, withoutGangrene; withoutopen wounds.  LE Pulses Right Left   FEMORAL 2+palpable 2+palpable   POPLITEAL notpalpable  notpalpable  POSTERIOR TIBIAL notpalpable  notpalpable   DORSALIS PEDIS ANTERIOR TIBIAL notpalpable  1+palpable    Abdomen: soft, NT, no palpable masses. Old midline abdominal surgical scar.  Skin:no rashes, no cellulitis, no ulcers noted. Suntanned.  Musculoskeletal: no muscle wasting or atrophy. Neurologic: A&O X 3;appropriateaffect, sensation isnormal; MOTOR FUNCTION: moving all extremities equally, motor strength 5/5 throughout. Speech is fluent/normal. CN 2-12 intact      Psychiatric: Thought content is normal, mood appropriate for clinical situation.     ASSESSMENT: KOLTEN RYBACK is a 72 y.o. male who is s/p failed right popliteal-peroneal artery bypass graft on 08-09-13 and failed right popliteal to posterior tibial arterial bypass graft on 11-17-13. This was due to his small diseased tibial vessels but fortunately he has maintained limb salvage.   He was walking 8 hours/day on his job until he developed recurrent colon cancer in October 2017, had several major setbacks, with significant weight loss and weakness.  He walks all day and stays busy, rare right calf claudication.  He has no signs of ischemia in his feet/legs.  Pt states he does not want any more surgery, as he has been through so much.He feels very well, is suntanned.   Fortunately he does not have DM,has never used tobacco, has a normal BMI, and stays active. His atherosclerotic risk factors include history of colon cancer . He has normal renal function.   His bilateral ABI and TBI have declined slightly despite all of his walking.   He states his energy is good.    DATA  ABI (Date: 07/19/2017):  R:   ABI: 0.41 (was 0.49 on 12-24-16),   PT: mono  DP: mono  TBI:  0.17, toe pressure 28 (was 0.26)  L:   ABI: 0.50 (was 0.56),   PT: mono  DP: mono  TBI: 0.23, toe pressure 38  (was 0.29) Slight decline in bilateral ABI and TBI, severe disease in the right, moderate disease in the left, all monophasic waveforms.    PLAN:  Continuegraduated walking program. Daily seated leg exercises discussed and demonstrated.  Based on the patient's vascular studies and examination, pt will return to clinic in1 yearwith ABI's. I advised pt to notify us if he develops concerns re the circulation in his feet/legs.   I discussed in depth with the patient the nature of  atherosclerosis, and emphasized the importance of maximal medical management including strict control of blood pressure, blood glucose, and lipid levels, obtaining regular exercise, and continued cessation of smoking.  The patient is aware that without maximal medical management the underlying atherosclerotic disease process will progress, limiting the benefit of any interventions.  The patient was given information about PAD including signs, symptoms, treatment, what symptoms should prompt the patient to seek immediate medical care, and risk reduction measures to take.  Clemon Chambers, RN, MSN, FNP-C [facility] Phone: [phone]  Clinic MD: Trula Slade  07/19/17 5:51 PM

## 2017-07-27 NOTE — Progress Notes (Signed)
Subjective:   Cody Suarez is a 72 y.o. male who presents for an Initial Medicare Annual Wellness Visit.  Review of Systems  No ROS.  Medicare Wellness Visit. Additional risk factors are reflected in the social history.  Cardiac Risk Factors include: advanced age (>63mn, >>57women);dyslipidemia;male gender;family history of premature cardiovascular disease   Sleep patterns: Sleeps 8 hours.  Home Safety/Smoke Alarms: Feels safe in home. Smoke alarms in place.  Living environment; residence and Firearm Safety: Lives with wife in 1 story home. Steps at doors with rails.  Seat Belt Safety/Bike Helmet: Wears seat belt.    Male:   CCS-Colonoscopy 08/2016, pt reports normal. Recall 1 year. Scheduling for 09/2017   PSA- No results found for: PSA     Objective:    Today's Vitals   07/28/17 1410  BP: (!) 142/64  Pulse: 100  SpO2: 98%  Weight: 179 lb 8 oz (81.4 kg)  Height: 5' 10"  (1.778 m)   Body mass index is 25.76 kg/m.  Advanced Directives 07/28/2017 08/07/2016 02/12/2016 02/06/2016 01/15/2016 11/27/2015 11/26/2015  Does Patient Have a Medical Advance Directive? Yes Yes - Yes Yes Yes Yes  Type of Advance Directive [place] will [place] will [place] will [place] will [place] will Healthcare Power of [place] Does patient want to make changes to medical advance directive? - - - No - Patient declined - No - Patient declined -  Copy of [place] Chart? No - copy requested No - copy requested No - copy requested No - copy requested No - copy requested No - copy requested No - copy requested  Would patient like information on creating a medical advance directive? - - - - - - -  Pre-existing out of facility DNR order (yellow form or pink MOST form) - - - - - - -    Current Medications  (verified) Outpatient Encounter Medications as of 07/28/2017  Medication Sig  . acetaminophen (TYLENOL) 500 MG tablet Take 500 mg by mouth every 6 (six) hours as needed.  . colestipol (COLESTID) 1 g tablet TAKE 1 TABLET BY MOUTH 1 TO 2 TIMES A DAY, NOT WITHIN AN HOUR OF OTHER MEDS  . doxylamine, Sleep, (UNISOM) 25 MG tablet Take 25 mg by mouth at bedtime as needed.  .Marland Kitchenlosartan (COZAAR) 100 MG tablet Take 1 tablet (100 mg total) by mouth daily. (Patient taking differently: Take 50 mg by mouth daily. )  . Multiple Vitamin (MULTIVITAMIN WITH MINERALS) TABS tablet Take 1 tablet by mouth daily.  . predniSONE (DELTASONE) 10 MG tablet Take 5 mg by mouth daily.   . rivaroxaban (XARELTO) 20 MG TABS tablet Take 1 tablet (20 mg total) by mouth daily with breakfast.  . sildenafil (VIAGRA) 50 MG tablet 1-2 tabs as needed for erectile dysfunction  . spironolactone (ALDACTONE) 25 MG tablet Take 1 tablet (25 mg total) by mouth daily. KEEP OV.  . sulfaSALAzine (AZULFIDINE) 500 MG EC tablet Take 1,500 mg by mouth 2 (two) times daily.  .Marland KitchenZoster Vaccine Adjuvanted ([facility] injection Inject 0.5 mLs into the muscle once for 1 dose.   No facility-administered encounter medications on file as of 07/28/2017.     Allergies (verified) Amlodipine and Gabapentin   History: Past Medical History:  Diagnosis Date  . Anemia of chronic disease 2015   "related to cancer tx" (08/09/2013)  . Blood clot in  vein 2015  . Colon cancer ([place]) 03/2013  . History of kidney stones    x 2passed them both  . Hypertension   . Peripheral vascular disease ([place])   . Ulcerative colitis ([place])    history   Past Surgical History:  Procedure Laterality Date  . ABDOMINAL AORTAGRAM N/A 08/09/2013   Procedure: ABDOMINAL Maxcine Ham;  Surgeon: [name], MD;  Location: [facility] CATH [facility];  Service: Cardiovascular;  Laterality: N/A;  . CHOLECYSTECTOMY N/A 09/08/2014   Procedure: LAPAROSCOPIC CHOLECYSTECTOMY WITH INTRAOPERATIVE CHOLANGIOGRAM;   Surgeon: Armandina Gemma, MD;  Location: [facility];  Service: General;  Laterality: N/A;  . COLON SURGERY  02/2013  . COLON SURGERY  10/2015  . FEMORAL-POPLITEAL BYPASS GRAFT Right 08/11/2013   Procedure:   RIGHT - POPLITEAL TO PERONEAL ARTERY BYPASS GRAFT  WITH NONREVERSED SAPHENOUS VEIN GRAFT,tHROMBECTOMY ANTERIOR TIBIALIS,ATTEMPTED THROMBECTOMY TIBIO-PERONEAL TRUNK AND POSTERIOR TIBIALIS, INTRAOPERATIVE ARTERIOGRAM.;  Surgeon: Mal Misty, MD;  Location: [facility];  Service: Vascular;  Laterality: Right;  . FEMORAL-TIBIAL BYPASS GRAFT Right 11/17/2013   Procedure: BYPASS GRAFT RIGHT ABOVE KNEE POPLITEAL TO POSTERIOR TIBIAL ARTERY USING RIGHT NON-REVERSED CEPHALIC VEIN;  Surgeon: Mal Misty, MD;  Location: [facility];  Service: Vascular;  Laterality: Right;  . HERNIA REPAIR  ~ 1995; 03/2013   UHR  . INTRAOPERATIVE ARTERIOGRAM Right 11/17/2013   Procedure: INTRA OPERATIVE ARTERIOGRAM;  Surgeon: Mal Misty, MD;  [facility] OR;  Service: Vascular;  Laterality: Right;  . IR GENERIC HISTORICAL  12/11/2015   IR RADIOLOGIST EVAL & MGMT 12/11/2015 Arne Cleveland, MD [facility]  . IR GENERIC HISTORICAL  12/24/2015   IR RADIOLOGIST EVAL & MGMT 12/24/2015 Jacqulynn Cadet, MD [facility]  . IR GENERIC HISTORICAL  01/21/2016   IR RADIOLOGIST EVAL & MGMT 01/21/2016 [name], MD [facility]  . IR GENERIC HISTORICAL  02/04/2016   IR RADIOLOGIST EVAL & MGMT 02/04/2016 Sandi Mariscal, MD [facility]  . IR GENERIC HISTORICAL  02/12/2016   IR CATHETER TUBE CHANGE 02/12/2016 Corrie Mckusick, DO [facility]  . IR GENERIC HISTORICAL  02/20/2016   IR RADIOLOGIST EVAL & MGMT 02/20/2016 Markus Daft, MD [facility]  . LAPAROSCOPIC RIGHT HEMI COLECTOMY N/A 03/28/2013   Procedure: LAPAROSCOPIC ASSISTED HEMI COLECTOMY;  Surgeon: Pedro Earls, MD;  [facility];  Service: General;  Laterality: N/A;  . LAPAROSCOPY N/A 11/05/2015   Procedure: LAPAROSCOPY DIAGNOSTIC;  Surgeon: [name], MD;   [facility];  Service: General;  Laterality: N/A;  . LAPAROTOMY N/A 11/05/2015   Procedure: EXPLORATORY LAPAROTOMY, resection of mass at tail of pancreas;  Surgeon: [name], MD;  [facility];  Service: General;  Laterality: N/A;  . LOWER EXTREMITY ANGIOGRAM Bilateral 08/09/2013  . TEE WITHOUT CARDIOVERSION N/A 08/10/2013   Procedure: TRANSESOPHAGEAL ECHOCARDIOGRAM (TEE);  Surgeon: Candee Furbish, MD;  [facility];  Service: Cardiovascular;  Laterality: N/A;  . TONSILLECTOMY  ~ 1950  . [place]; 03/2013  . VASECTOMY    . VEIN HARVEST Right 11/17/2013   Procedure: HARVEST OF RIGHT UPPER EXTREMITY CEPHALIC VEIN;  Surgeon: Mal Misty, MD;  Location: [facility];  Service: Vascular;  Laterality: Right;   Family History  Problem Relation Age of Onset  . Heart disease Mother   . Emphysema Father    Social History   Socioeconomic History  . Marital status: Married    Spouse name: Not on file  . Number of children: Not on file  .  Years of education: Not on file  . Highest education level: Not on file  Occupational History  . Not on file  Social Needs  . Financial resource strain: Not on file  . Food insecurity:    Worry: Not on file    Inability: Not on file  . Transportation needs:    Medical: Not on file    Non-medical: Not on file  Tobacco Use  . Smoking status: Never Smoker  . Smokeless tobacco: Never Used  . Tobacco comment: states his father died of emphysema from smoking, he did not want to be like that  Substance and Sexual Activity  . Alcohol use: No  . Drug use: No  . Sexual activity: Yes  Lifestyle  . Physical activity:    Days per week: Not on file    Minutes per session: Not on file  . Stress: Not on file  Relationships  . Social connections:    Talks on phone: Not on file    Gets together: Not on file    Attends religious service: Not on file    Active member of club or organization: Not on file    Attends meetings of  clubs or organizations: Not on file    Relationship status: Not on file  Other Topics Concern  . Not on file  Social History Narrative  . Not on file   Tobacco Counseling Counseling given: Not Answered Comment: states his father died of emphysema from smoking, he did not want to be like that   Activities of Daily Living In your present state of health, do you have any difficulty performing the following activities: 07/28/2017 05/05/2017  Hearing? N N  Vision? N N  Difficulty concentrating or making decisions? N N  Walking or climbing stairs? N N  Dressing or bathing? N N  Doing errands, shopping? N N  Preparing Food and eating ? N -  Using the Toilet? N -  In the past six months, have you accidently leaked urine? N -  Do you have problems with loss of bowel control? N -  Managing your Medications? N -  Managing your Finances? N -  Housekeeping or managing your Housekeeping? N -  Some recent data might be hidden     Immunizations and Health Maintenance Immunization History  Administered Date(s) Administered  . Influenza, High Dose Seasonal PF 10/11/2013, 08/13/2016  . Influenza,inj,Quad PF,6+ Mos 09/23/2015  . Influenza-Unspecified 12/21/2011, 10/11/2013  . Pneumococcal Conjugate-13 07/28/2017  . Pneumococcal Polysaccharide-23 12/21/2011  . Pneumococcal-Unspecified 09/06/2011   There are no preventive care reminders to display for this patient.  Patient Care Team: Midge Minium, MD as PCP - General (Family Medicine) [name], MD as Consulting Physician (Cardiology) [name], MD as Consulting Physician (Oncology) [name], MD as Consulting Physician (Gastroenterology) Nickel, Sharmon Leyden, NP as Nurse Practitioner (Vascular Surgery) Mertha Baars (Dentistry)  Indicate any recent Medical Services you may have received from other than [facility] providers in the past year (date may be approximate).    Assessment:   This is a routine wellness examination  for [place].  Hearing/Vision screen Hearing Screening Comments: Hearing aids bilaterally.   Vision Screening Comments: Last exam 01/05/2017, yearly. Dr. Hassell Done. Wears glasses.   Dietary issues and exercise activities discussed: Current Exercise Habits: The patient does not participate in regular exercise at present("always stays active"), Exercise limited by: None identified   Diet (meal preparation, eat out, water intake, caffeinated beverages, dairy products, fruits and vegetables):  Drinks water, milk and occasional soda/tea.   Breakfast: cereal; bacon/eggs; coffee (1 cup) Lunch: sandwich; "full meal" Dinner: protein and vegetables  Goals    . Patient Stated     Remain active.       Depression Screen PHQ 2/9 Scores 07/28/2017 05/05/2017 10/09/2015  PHQ - 2 Score 0 0 0  PHQ- 9 Score - 0 -    Fall Risk Fall Risk  07/28/2017 05/05/2017 10/09/2015 11/24/2013 10/31/2013  Falls in the past year? No No No No No  Risk for fall due to : - - - - -  Risk for fall due to: Comment - - - - -     Cognitive Function: MMSE - Mini Mental State Exam 07/28/2017  Orientation to time 5  Orientation to Place 5  Registration 3  Attention/ Calculation 5  Recall 3  Language- name 2 objects 2  Language- repeat 1  Language- follow 3 step command 3  Language- read & follow direction 1  Write a sentence 1  Copy design 1  Total score 30        Screening Tests Health Maintenance  Topic Date Due  . TETANUS/TDAP  07/29/2018 (Originally 12/18/1964)  . INFLUENZA VACCINE  08/05/2017  . COLONOSCOPY  06/29/2023  . Hepatitis C Screening  Completed  . PNA vac Low Risk Adult  Completed       Plan:     Shingles vaccine at pharmacy.   Bring a copy of your living will and/or healthcare power of attorney to your next office visit.  Continue doing brain stimulating activities (puzzles, reading, adult coloring books, staying active) to keep memory sharp.    I have personally reviewed and noted the  following in the patient's chart:   . Medical and social history . Use of alcohol, tobacco or illicit drugs  . Current medications and supplements . Functional ability and status . Nutritional status . Physical activity . Advanced directives . List of other physicians . Hospitalizations, surgeries, and ER visits in previous 12 months . Vitals . Screenings to include cognitive, depression, and falls . Referrals and appointments  In addition, I have reviewed and discussed with patient certain preventive protocols, quality metrics, and best practice recommendations. A written personalized care plan for preventive services as well as general preventive health recommendations were provided to patient.     Gerilyn Nestle, RN   07/28/2017    F/U with PCP 11/2017

## 2017-07-28 ENCOUNTER — Other Ambulatory Visit: Payer: Self-pay

## 2017-07-28 ENCOUNTER — Ambulatory Visit (INDEPENDENT_AMBULATORY_CARE_PROVIDER_SITE_OTHER): Payer: Medicare Other

## 2017-07-28 DIAGNOSIS — Z0000 Encounter for general adult medical examination without abnormal findings: Secondary | ICD-10-CM | POA: Diagnosis not present

## 2017-07-28 DIAGNOSIS — Z23 Encounter for immunization: Secondary | ICD-10-CM | POA: Diagnosis not present

## 2017-07-28 MED ORDER — ZOSTER VAC RECOMB ADJUVANTED 50 MCG/0.5ML IM SUSR: 0.5000 mL | Freq: Once | INTRAMUSCULAR | 1 refills | Status: AC

## 2017-07-28 NOTE — Progress Notes (Signed)
Reviewing RN [place] note in PCP absence.   Leeanne Rio, PA-C

## 2017-07-28 NOTE — Patient Instructions (Addendum)
Shingles vaccine at pharmacy.   Bring a copy of your living will and/or healthcare power of attorney to your next office visit.  Continue doing brain stimulating activities (puzzles, reading, adult coloring books, staying active) to keep memory sharp.    Health Maintenance, Male A healthy lifestyle and preventive care is important for your health and wellness. Ask your health care provider about what schedule of regular examinations is right for you. What should I know about weight and diet? Eat a Healthy Diet  Eat plenty of vegetables, fruits, whole grains, low-fat dairy products, and lean protein.  Do not eat a lot of foods high in solid fats, added sugars, or salt.  Maintain a Healthy Weight Regular exercise can help you achieve or maintain a healthy weight. You should:  Do at least 150 minutes of exercise each week. The exercise should increase your heart rate and make you sweat (moderate-intensity exercise).  Do strength-training exercises at least twice a week.  Watch Your Levels of Cholesterol and Blood Lipids  Have your blood tested for lipids and cholesterol every 5 years starting at 73 years of age. If you are at high risk for heart disease, you should start having your blood tested when you are 73 years old. You may need to have your cholesterol levels checked more often if: ? Your lipid or cholesterol levels are high. ? You are older than 73 years of age. ? You are at high risk for heart disease.  What should I know about cancer screening? Many types of cancers can be detected early and may often be prevented. Lung Cancer  You should be screened every year for lung cancer if: ? You are a current smoker who has smoked for at least 30 years. ? You are a former smoker who has quit within the past 15 years.  Talk to your health care provider about your screening options, when you should start screening, and how often you should be screened.  Colorectal  Cancer  Routine colorectal cancer screening usually begins at 73 years of age and should be repeated every 5-10 years until you are 73 years old. You may need to be screened more often if early forms of precancerous polyps or small growths are found. Your health care provider may recommend screening at an earlier age if you have risk factors for colon cancer.  Your health care provider may recommend using home test kits to check for hidden blood in the stool.  A small camera at the end of a tube can be used to examine your colon (sigmoidoscopy or colonoscopy). This checks for the earliest forms of colorectal cancer.  Prostate and Testicular Cancer  Depending on your age and overall health, your health care provider may do certain tests to screen for prostate and testicular cancer.  Talk to your health care provider about any symptoms or concerns you have about testicular or prostate cancer.  Skin Cancer  Check your skin from head to toe regularly.  Tell your health care provider about any new moles or changes in moles, especially if: ? There is a change in a mole's size, shape, or color. ? You have a mole that is larger than a pencil eraser.  Always use sunscreen. Apply sunscreen liberally and repeat throughout the day.  Protect yourself by wearing long sleeves, pants, a wide-brimmed hat, and sunglasses when outside.  What should I know about heart disease, diabetes, and high blood pressure?  If you are 47-98 years of age,  have your blood pressure checked every 3-5 years. If you are 15 years of age or older, have your blood pressure checked every year. You should have your blood pressure measured twice-once when you are at a hospital or clinic, and once when you are not at a hospital or clinic. Record the average of the two measurements. To check your blood pressure when you are not at a hospital or clinic, you can use: ? An automated blood pressure machine at a pharmacy. ? A home blood  pressure monitor.  Talk to your health care provider about your target blood pressure.  If you are between 71-83 years old, ask your health care provider if you should take aspirin to prevent heart disease.  Have regular diabetes screenings by checking your fasting blood sugar level. ? If you are at a normal weight and have a low risk for diabetes, have this test once every three years after the age of 35. ? If you are overweight and have a high risk for diabetes, consider being tested at a younger age or more often.  A one-time screening for abdominal aortic aneurysm (AAA) by ultrasound is recommended for men aged 57-75 years who are current or former smokers. What should I know about preventing infection? Hepatitis B If you have a higher risk for hepatitis B, you should be screened for this virus. Talk with your health care provider to find out if you are at risk for hepatitis B infection. Hepatitis C Blood testing is recommended for:  Everyone born from [birth year] through 1966.  Anyone with known risk factors for hepatitis C.  Sexually Transmitted Diseases (STDs)  You should be screened each year for STDs including gonorrhea and chlamydia if: ? You are sexually active and are younger than 73 years of age. ? You are older than 73 years of age and your health care provider tells you that you are at risk for this type of infection. ? Your sexual activity has changed since you were last screened and you are at an increased risk for chlamydia or gonorrhea. Ask your health care provider if you are at risk.  Talk with your health care provider about whether you are at high risk of being infected with HIV. Your health care provider may recommend a prescription medicine to help prevent HIV infection.  What else can I do?  Schedule regular health, dental, and eye exams.  Stay current with your vaccines (immunizations).  Do not use any tobacco products, such as cigarettes, chewing tobacco, and  e-cigarettes. If you need help quitting, ask your health care provider.  Limit alcohol intake to no more than 2 drinks per day. One drink equals 12 ounces of beer, 5 ounces of wine, or 1 ounces of hard liquor.  Do not use street drugs.  Do not share needles.  Ask your health care provider for help if you need support or information about quitting drugs.  Tell your health care provider if you often feel depressed.  Tell your health care provider if you have ever been abused or do not feel safe at home. This information is not intended to replace advice given to you by your health care provider. Make sure you discuss any questions you have with your health care provider. Document Released: 06/20/2007 Document Revised: 08/21/2015 Document Reviewed: 09/25/2014 Elsevier Interactive Patient Education  [employer].

## 2017-08-10 ENCOUNTER — Telehealth: Payer: Self-pay | Admitting: Cardiovascular Disease

## 2017-08-10 NOTE — Telephone Encounter (Signed)
New Message   Patient is calling and requesting that the nurse call him back. Refused to provide me any information. Please call

## 2017-08-10 NOTE — Telephone Encounter (Signed)
Returned call, spoke to wife who states patient just left home.   She states he was trying to speak with Lattie Haw.  Advised would make RN aware to call mobile if able.

## 2017-08-10 NOTE — Telephone Encounter (Signed)
Medication samples have been provided to the patient.  Drug name: Xarelto 20 mg  Qty: 21   LOT: [id number]  Exp.Date: 06/21  Samples left at front desk for patient pick-up.

## 2017-08-10 NOTE — Telephone Encounter (Signed)
Patient called requesting samples of Xarelto. He has been made aware that they are available.

## 2017-09-01 ENCOUNTER — Telehealth: Payer: Self-pay | Admitting: Cardiovascular Disease

## 2017-09-01 NOTE — Telephone Encounter (Signed)
Returned call to patient requesting Xarelto 20 mg samples.Xarelto 20 mg samples left at [employer] office front desk.

## 2017-09-01 NOTE — Telephone Encounter (Signed)
New Message:     Pt is calling and would like a return call from [place]. Pt would not disclose the nature of the call.

## 2017-09-20 ENCOUNTER — Inpatient Hospital Stay: Payer: Medicare Other | Attending: Oncology

## 2017-09-20 DIAGNOSIS — C184 Malignant neoplasm of transverse colon: Secondary | ICD-10-CM | POA: Diagnosis not present

## 2017-09-20 LAB — CMP (CANCER CENTER ONLY)
ALT: 10 U/L (ref 0–44)
ANION GAP: 10 (ref 5–15)
AST: 15 U/L (ref 15–41)
Albumin: 3.7 g/dL (ref 3.5–5.0)
Alkaline Phosphatase: 66 U/L (ref 38–126)
BUN: 13 mg/dL (ref 8–23)
CO2: 25 mmol/L (ref 22–32)
Calcium: 9.6 mg/dL (ref 8.9–10.3)
Chloride: 103 mmol/L (ref 98–111)
Creatinine: 1.25 mg/dL — ABNORMAL HIGH (ref 0.61–1.24)
GFR, Est AFR Am: >60 mL/min (ref >60)
GFR, Estimated: 56 mL/min — ABNORMAL LOW (ref >60)
GLUCOSE: 262 mg/dL — AB (ref 70–99)
POTASSIUM: 3.8 mmol/L (ref 3.5–5.1)
SODIUM: 138 mmol/L (ref 135–145)
TOTAL PROTEIN: 8 g/dL (ref 6.5–8.1)
Total Bilirubin: 0.3 mg/dL (ref 0.3–1.2)

## 2017-09-20 LAB — CBC WITH DIFFERENTIAL/PLATELET
BASOS ABS: 0.1 10*3/uL (ref 0.0–0.1)
Basophils Relative: 1 %
Eosinophils Absolute: 0.2 10*3/uL (ref 0.0–0.5)
Eosinophils Relative: 2 %
HEMATOCRIT: 39.4 % (ref 38.4–49.9)
HEMOGLOBIN: 12.2 g/dL — AB (ref 13.0–17.1)
LYMPHS PCT: 13 %
Lymphs Abs: 1.5 10*3/uL (ref 0.9–3.3)
MCH: 25.5 pg — ABNORMAL LOW (ref 27.2–33.4)
MCHC: 31 g/dL — ABNORMAL LOW (ref 32.0–36.0)
MCV: 82.3 fL (ref 79.3–98.0)
MONOS PCT: 9 %
Monocytes Absolute: 1 10*3/uL — ABNORMAL HIGH (ref 0.1–0.9)
NEUTROS ABS: 8.3 10*3/uL — AB (ref 1.5–6.5)
Neutrophils Relative %: 75 %
Platelets: 377 10*3/uL (ref 140–400)
RBC: 4.79 MIL/uL (ref 4.20–5.82)
RDW: 18.6 % — ABNORMAL HIGH (ref 11.0–14.6)
WBC: 11.1 10*3/uL — ABNORMAL HIGH (ref 4.0–10.3)

## 2017-09-20 LAB — CEA (IN HOUSE-CHCC): CEA (CHCC-In House): 3.63 ng/mL (ref 0.00–5.00)

## 2017-09-21 ENCOUNTER — Telehealth: Payer: Self-pay | Admitting: Oncology

## 2017-09-21 ENCOUNTER — Inpatient Hospital Stay: Payer: Medicare Other | Admitting: Nurse Practitioner

## 2017-09-21 ENCOUNTER — Telehealth: Payer: Self-pay | Admitting: Emergency Medicine

## 2017-09-21 NOTE — Telephone Encounter (Signed)
Pt scheduled per 9/17 sch message. Nurse will inform pt of appts.

## 2017-09-21 NOTE — Telephone Encounter (Signed)
CT scan info and appt w/ Dr.Sherrill given to pt. He verbalized understanding of this. Ct prep instructions given also.

## 2017-09-28 ENCOUNTER — Telehealth: Payer: Self-pay | Admitting: Cardiovascular Disease

## 2017-09-28 NOTE — Telephone Encounter (Signed)
Returned call to patient Xarelto 20 mg samples left at [employer] office front desk.

## 2017-09-28 NOTE — Telephone Encounter (Signed)
Patient calling the office for samples of medication:   1.  What medication and dosage are you requesting samples for? Xarelto  2.  Are you currently out of this medication?  2 left  t

## 2017-09-29 ENCOUNTER — Ambulatory Visit (HOSPITAL_COMMUNITY)
Admission: RE | Admit: 2017-09-29 | Discharge: 2017-09-29 | Disposition: A | Discharge status: Home / Self Care | Payer: Medicare Other | Source: Ambulatory Visit | Attending: Oncology | Admitting: Oncology

## 2017-09-29 DIAGNOSIS — C189 Malignant neoplasm of colon, unspecified: Secondary | ICD-10-CM | POA: Diagnosis not present

## 2017-09-29 DIAGNOSIS — C184 Malignant neoplasm of transverse colon: Secondary | ICD-10-CM | POA: Insufficient documentation

## 2017-09-29 DIAGNOSIS — K429 Umbilical hernia without obstruction or gangrene: Secondary | ICD-10-CM | POA: Diagnosis not present

## 2017-09-29 DIAGNOSIS — I700 Atherosclerosis of aorta: Secondary | ICD-10-CM | POA: Insufficient documentation

## 2017-09-29 DIAGNOSIS — R918 Other nonspecific abnormal finding of lung field: Secondary | ICD-10-CM | POA: Diagnosis not present

## 2017-09-29 DIAGNOSIS — C7889 Secondary malignant neoplasm of other digestive organs: Secondary | ICD-10-CM | POA: Diagnosis not present

## 2017-09-29 MED ORDER — IOHEXOL 300 MG/ML  SOLN
100.0000 mL | Freq: Once | INTRAMUSCULAR | Status: AC | PRN
  Administered 2017-09-29: 100 mL via INTRAVENOUS

## 2017-10-06 ENCOUNTER — Inpatient Hospital Stay: Payer: Medicare Other | Attending: Oncology | Admitting: Oncology

## 2017-10-06 ENCOUNTER — Telehealth: Payer: Self-pay | Admitting: Oncology

## 2017-10-06 VITALS — BP 163/67 | HR 96 | Temp 97.9°F | Resp 18 | Ht 70.0 in | Wt 175.7 lb

## 2017-10-06 DIAGNOSIS — C184 Malignant neoplasm of transverse colon: Secondary | ICD-10-CM

## 2017-10-06 DIAGNOSIS — I10 Essential (primary) hypertension: Secondary | ICD-10-CM | POA: Diagnosis not present

## 2017-10-06 DIAGNOSIS — R1900 Intra-abdominal and pelvic swelling, mass and lump, unspecified site: Secondary | ICD-10-CM | POA: Diagnosis not present

## 2017-10-06 DIAGNOSIS — C7889 Secondary malignant neoplasm of other digestive organs: Secondary | ICD-10-CM

## 2017-10-06 DIAGNOSIS — R918 Other nonspecific abnormal finding of lung field: Secondary | ICD-10-CM

## 2017-10-06 NOTE — Progress Notes (Signed)
Cody Suarez OFFICE PROGRESS NOTE   Diagnosis: Colon cancer  INTERVAL HISTORY:   Cody Suarez returns for a scheduled visit.  A CT abdomen/pelvis 09/29/2017 revealed enlargement of the left upper quadrant mass.  He feels well.  No abdominal pain.  Good appetite.  No complaint.  He reports intentional weight loss.  Objective:  Vital signs in last 24 hours:  Blood pressure (!) 163/67, pulse 96, temperature 97.9 F (36.6 C), temperature source Oral, resp. rate 18, height 5' 10"  (1.778 m), weight 175 lb 11.2 oz (79.7 kg), SpO2 100 %.    HEENT: Neck without mass Lymphatics: No cervical or supraclavicular nodes Resp: Lungs clear bilaterally Cardio: Regular rate and rhythm GI: No hepatosplenomegaly, no mass, nontender Vascular: No leg edema, the right lower leg is slightly larger than the left side   Lab Results:  Lab Results  Component Value Date   WBC 11.1 (H) 09/20/2017   HGB 12.2 (L) 09/20/2017   HCT 39.4 09/20/2017   MCV 82.3 09/20/2017   PLT 377 09/20/2017   NEUTROABS 8.3 (H) 09/20/2017    CMP  Lab Results  Component Value Date   NA 138 09/20/2017   K 3.8 09/20/2017   CL 103 09/20/2017   CO2 25 09/20/2017   GLUCOSE 262 (H) 09/20/2017   BUN 13 09/20/2017   CREATININE 1.25 (H) 09/20/2017   CALCIUM 9.6 09/20/2017   PROT 8.0 09/20/2017   ALBUMIN 3.7 09/20/2017   AST 15 09/20/2017   ALT 10 09/20/2017   ALKPHOS 66 09/20/2017   BILITOT 0.3 09/20/2017   GFRNONAA 56 (L) 09/20/2017   GFRAA >60 09/20/2017    Lab Results  Component Value Date   CEA1 3.63 09/20/2017     Imaging:  CT images from 09/29/2017 reviewed with Mr. Nabor Medications: I have reviewed the patient's current medications.   Assessment/Plan: 1. 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 Negative for a  BRAF mutation  Tumor invaded through the muscularis propria into pericolonic fatty tissue and involved the attached omentum.   Cycle 1 adjuvant Xeloda 05/28/2013.   Cycle 2 adjuvant Xeloda 06/18/2013.   Cycle 3 adjuvant Xeloda 07/09/2013.   Cycle 4 adjuvant Xeloda 07/30/2013.   Cycle 5 adjuvant Xeloda 08/20/2013.  Cycle 6 adjuvant Xeloda 09/11/2013.  cycle 7 adjuvant Xeloda 10/02/2013  Cycle 8 adjuvant Xeloda 10/23/2013    CTs of the chest, abdomen, and pelvis on 07/18/2014-negative for recurrent colon cancer   CTs abdomen/pelvis 09/06/2014 showed acute cholecystitis.  Colonoscopy 08/02/2015-chronic colitis, no malignancy  CTs 09/17/2015, new mass near the tail the pancreas, stable lung nodules  Biopsy mesenteric mass 10/04/2015 with metastatic adenocarcinoma consistent with colorectal primary  PET scan 10/10/2015 with soft tissue thickening within the peritoneal space of the left upper quadrant with SUV Max 7.9; no additional sites of peritoneal nodularity to suggest metastasis; hypermetabolic ill-defined nodule in the left lower lobe favored inflammatory.  Resection of the left abdominal mesenteric mass/tail the pancreas on 11/05/2015 with the pathology confirming metastatic colon cancer, positive "serosal" margin  CTs 07/29/2016-no evidence of metastatic disease, stable small lung nodules, decreased size of pancreatic resection bed fluid/mass  CTs 06/28/2017- decreased fluid and increased soft tissue at the area of the distal pancreatectomy with a few foci of gas  CT 09/29/2017- enlargement of left upper quadrant soft tissue mass between the stomach, spleen, and pancreas tail, stable  nodules at the lung bases 2. Ulcerative colitis-extensive chronic active ulcerative colitis was noted on the colon resection specimen 03/28/2013.  Followed by Dr.Magod 3. Hypertension.  4. History of microcytic anemia-likely iron deficiency, unable to tolerate oral iron.  Improved. 5. Possible area of cecal wall thickening noted on abdominal CT 03/20/2013. 6. Family history of colon cancer (maternal grandfather died in his 57s with colon cancer). 7. Bilateral calf and low anterior leg pain. Bilateral lower extremity venous duplex negative for DVT 07/14/2013. Right ABI with moderate, borderline severe arterial insufficiency; left ABI suggestive of moderate arterial insufficiency. He was referred to vascular. 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 TEE showed thrombus in the descending aorta. He underwent right popliteal to peroneal artery bypass graft, thrombectomy anterior tibialis and attempted thrombectomy tibioperoneal trunk and posterior tibialis on 08/11/2013. Right calf pain 09/26/2013 with findings of occlusion of right popliteal to peroneal bypass status post thrombolysis. Now on anticoagulation with Xarelto.  recurrent pain and ulceration at the right foot, status post a popliteal to posterior tibial artery bypass using a cephalic vein graft on 33/83/2919 8. Status post laparoscopic cholecystectomy 09/08/2014 9. Anorexia following the mesenteric mass/pancreas tail resection-improved 10. Abdominal abscess November 2017-pancreas "cysts "drainage catheter placed 11/26/2015; CT 02/04/2016 with no change to slight decreasein size of residual pancreatic tail fluid collection.Drainage catheter removed approximately early March 2018  CT 02/20/2016-new small left pleural effusion, increased size of the surgical bed fluid collection, indeterminate pulmonary nodules  CT 07/29/2016-decreased fluid/soft tissue density near the pancreas is no evidence of metastatic disease, stable subcentimeter lung nodules   Disposition: He appears unchanged.  I reviewed the CT images with Mr. Prien.  The mass in the left upper quadrant has enlarged compared to the CT from June.  I will refer him for a biopsy of the mass.  We will request core biopsy tissue  for molecular testing.  He will return for an office visit in approximately 2 weeks.  15 minutes were spent with the patient today.  The majority of the time was used for counseling and coordination of care.  Betsy Coder, MD  10/06/2017  9:23 AM

## 2017-10-06 NOTE — Telephone Encounter (Signed)
Gave pt avs and calendar  °

## 2017-10-18 ENCOUNTER — Other Ambulatory Visit: Payer: Self-pay | Admitting: Radiology

## 2017-10-19 ENCOUNTER — Encounter (HOSPITAL_COMMUNITY): Payer: Self-pay

## 2017-10-19 ENCOUNTER — Ambulatory Visit (HOSPITAL_COMMUNITY)
Admission: RE | Admit: 2017-10-19 | Discharge: 2017-10-19 | Disposition: A | Discharge status: Home / Self Care | Payer: Medicare Other | Source: Ambulatory Visit | Attending: Oncology | Admitting: Oncology

## 2017-10-19 ENCOUNTER — Other Ambulatory Visit: Payer: Self-pay

## 2017-10-19 DIAGNOSIS — C7989 Secondary malignant neoplasm of other specified sites: Secondary | ICD-10-CM | POA: Diagnosis not present

## 2017-10-19 DIAGNOSIS — Z7952 Long term (current) use of systemic steroids: Secondary | ICD-10-CM | POA: Diagnosis not present

## 2017-10-19 DIAGNOSIS — Z8249 Family history of ischemic heart disease and other diseases of the circulatory system: Secondary | ICD-10-CM | POA: Insufficient documentation

## 2017-10-19 DIAGNOSIS — Z9221 Personal history of antineoplastic chemotherapy: Secondary | ICD-10-CM | POA: Diagnosis not present

## 2017-10-19 DIAGNOSIS — K5190 Ulcerative colitis, unspecified, without complications: Secondary | ICD-10-CM | POA: Insufficient documentation

## 2017-10-19 DIAGNOSIS — I739 Peripheral vascular disease, unspecified: Secondary | ICD-10-CM | POA: Insufficient documentation

## 2017-10-19 DIAGNOSIS — I10 Essential (primary) hypertension: Secondary | ICD-10-CM | POA: Diagnosis not present

## 2017-10-19 DIAGNOSIS — K8681 Exocrine pancreatic insufficiency: Secondary | ICD-10-CM | POA: Insufficient documentation

## 2017-10-19 DIAGNOSIS — Z79899 Other long term (current) drug therapy: Secondary | ICD-10-CM | POA: Insufficient documentation

## 2017-10-19 DIAGNOSIS — Z85038 Personal history of other malignant neoplasm of large intestine: Secondary | ICD-10-CM | POA: Insufficient documentation

## 2017-10-19 DIAGNOSIS — C7889 Secondary malignant neoplasm of other digestive organs: Secondary | ICD-10-CM | POA: Insufficient documentation

## 2017-10-19 DIAGNOSIS — Z9049 Acquired absence of other specified parts of digestive tract: Secondary | ICD-10-CM | POA: Insufficient documentation

## 2017-10-19 DIAGNOSIS — Z888 Allergy status to other drugs, medicaments and biological substances status: Secondary | ICD-10-CM | POA: Insufficient documentation

## 2017-10-19 DIAGNOSIS — D7389 Other diseases of spleen: Secondary | ICD-10-CM | POA: Diagnosis not present

## 2017-10-19 DIAGNOSIS — R1902 Left upper quadrant abdominal swelling, mass and lump: Secondary | ICD-10-CM | POA: Diagnosis present

## 2017-10-19 DIAGNOSIS — Z7901 Long term (current) use of anticoagulants: Secondary | ICD-10-CM | POA: Diagnosis not present

## 2017-10-19 DIAGNOSIS — C19 Malignant neoplasm of rectosigmoid junction: Secondary | ICD-10-CM | POA: Diagnosis not present

## 2017-10-19 LAB — CBC WITH DIFFERENTIAL/PLATELET
ABS IMMATURE GRANULOCYTES: 0.13 10*3/uL — AB (ref 0.00–0.07)
BASOS PCT: 1 %
Basophils Absolute: 0.2 10*3/uL — ABNORMAL HIGH (ref 0.0–0.1)
EOS PCT: 2 %
Eosinophils Absolute: 0.3 10*3/uL (ref 0.0–0.5)
HCT: 42.2 % (ref 39.0–52.0)
Hemoglobin: 12.7 g/dL — ABNORMAL LOW (ref 13.0–17.0)
IMMATURE GRANULOCYTES: 1 %
LYMPHS PCT: 10 %
Lymphs Abs: 1.6 10*3/uL (ref 0.7–4.0)
MCH: 24.7 pg — ABNORMAL LOW (ref 26.0–34.0)
MCHC: 30.1 g/dL (ref 30.0–36.0)
MCV: 82.1 fL (ref 80.0–100.0)
MONO ABS: 1.4 10*3/uL — AB (ref 0.1–1.0)
Monocytes Relative: 9 %
NEUTROS ABS: 12.4 10*3/uL — AB (ref 1.7–7.7)
NRBC: 0 % (ref 0.0–0.2)
Neutrophils Relative %: 77 %
PLATELETS: 454 10*3/uL — AB (ref 150–400)
RBC: 5.14 MIL/uL (ref 4.22–5.81)
RDW: 17.4 % — ABNORMAL HIGH (ref 11.5–15.5)
WBC: 15.9 10*3/uL — AB (ref 4.0–10.5)

## 2017-10-19 LAB — COMPREHENSIVE METABOLIC PANEL
ALT: 16 U/L (ref 0–44)
AST: 26 U/L (ref 15–41)
Albumin: 4.2 g/dL (ref 3.5–5.0)
Alkaline Phosphatase: 60 U/L (ref 38–126)
Anion gap: 14 (ref 5–15)
BUN: 16 mg/dL (ref 8–23)
CO2: 23 mmol/L (ref 22–32)
CREATININE: 1.2 mg/dL (ref 0.61–1.24)
Calcium: 9.6 mg/dL (ref 8.9–10.3)
Chloride: 104 mmol/L (ref 98–111)
GFR calc Af Amer: >60 mL/min (ref >60)
GFR calc non Af Amer: 59 mL/min — ABNORMAL LOW (ref >60)
Glucose, Bld: 158 mg/dL — ABNORMAL HIGH (ref 70–99)
Potassium: 3.9 mmol/L (ref 3.5–5.1)
SODIUM: 141 mmol/L (ref 135–145)
Total Bilirubin: 0.6 mg/dL (ref 0.3–1.2)
Total Protein: 8.6 g/dL — ABNORMAL HIGH (ref 6.5–8.1)

## 2017-10-19 LAB — PROTIME-INR
INR: 1.08
Prothrombin Time: 13.9 seconds (ref 11.4–15.2)

## 2017-10-19 MED ORDER — SODIUM CHLORIDE 0.9 % IV SOLN
INTRAVENOUS | Status: DC
  Administered 2017-10-19: 07:00:00 via INTRAVENOUS

## 2017-10-19 MED ORDER — MIDAZOLAM HCL 2 MG/2ML IJ SOLN
INTRAMUSCULAR | Status: AC
  Filled 2017-10-19: qty 4

## 2017-10-19 MED ORDER — FENTANYL CITRATE (PF) 100 MCG/2ML IJ SOLN
INTRAMUSCULAR | Status: AC | PRN
  Administered 2017-10-19 (×2): 50 ug via INTRAVENOUS

## 2017-10-19 MED ORDER — MIDAZOLAM HCL 2 MG/2ML IJ SOLN
INTRAMUSCULAR | Status: AC | PRN
  Administered 2017-10-19 (×2): 1 mg via INTRAVENOUS

## 2017-10-19 MED ORDER — LIDOCAINE HCL (PF) 1 % IJ SOLN
INTRAMUSCULAR | Status: AC | PRN
  Administered 2017-10-19: 10 mL

## 2017-10-19 MED ORDER — FENTANYL CITRATE (PF) 100 MCG/2ML IJ SOLN
INTRAMUSCULAR | Status: AC
  Filled 2017-10-19: qty 2

## 2017-10-19 NOTE — H&P (Signed)
Chief Complaint: Patient was seen in consultation today for percutaneous biopsy of LUQ mass  Referring Physician(s): Betsy Coder B  Supervising Physician: Corrie Mckusick  Patient Status: [facility] - Out-pt  History of Present Illness: Cody Suarez is a 72 y.o. male with a past medical history of HTN, ulcerative colitis, PVD and colon cancer (diagnosed 2015; s/p chemotherapy) followed by Dr. Benay Spice. Patient underwent routine CT abdomen/pelvis on 09/29/17 which showed enlargement of previously known left upper quadrant mass - request was made to IR for biopsy of this mass.  Patient reports he "feels the same as always," he denies feeling any abdominal mass. He states he has no complaints today other than being anxious.  Past Medical History:  Diagnosis Date  . Anemia of chronic disease 2015   "related to cancer tx" (08/09/2013)  . Blood clot in vein 2015  . Colon cancer ([place]) 03/2013  . History of kidney stones    x 2passed them both  . Hypertension   . Peripheral vascular disease ([place])   . Ulcerative colitis ([place])    history    Past Surgical History:  Procedure Laterality Date  . ABDOMINAL AORTAGRAM N/A 08/09/2013   Procedure: ABDOMINAL Maxcine Ham;  Surgeon: [name], MD;  Location: [place] CATH LAB;  Service: Cardiovascular;  Laterality: N/A;  . CHOLECYSTECTOMY N/A 09/08/2014   Procedure: LAPAROSCOPIC CHOLECYSTECTOMY WITH INTRAOPERATIVE CHOLANGIOGRAM;  Surgeon: Armandina Gemma, MD;  Location: [facility] OR[facility];  Service: General;  Laterality: N/A;  . COLON SURGERY  02/2013  . COLON SURGERY  10/2015  . FEMORAL-POPLITEAL BYPASS GRAFT Right 08/11/2013   Procedure:   RIGHT - POPLITEAL TO PERONEAL ARTERY BYPASS GRAFT  WITH NONREVERSED SAPHENOUS VEIN GRAFT,tHROMBECTOMY ANTERIOR TIBIALIS,ATTEMPTED THROMBECTOMY TIBIO-PERONEAL TRUNK AND POSTERIOR TIBIALIS, INTRAOPERATIVE ARTERIOGRAM.;  Surgeon: Mal Misty, MD;  Location: [place];  Service: Vascular;  Laterality: Right;  . FEMORAL-TIBIAL BYPASS GRAFT  Right 11/17/2013   Procedure: BYPASS GRAFT RIGHT ABOVE KNEE POPLITEAL TO POSTERIOR TIBIAL ARTERY USING RIGHT NON-REVERSED CEPHALIC VEIN;  Surgeon: Mal Misty, MD;  Location: [place];  Service: Vascular;  Laterality: Right;  . HERNIA REPAIR  ~ 1995; 03/2013   UHR  . INTRAOPERATIVE ARTERIOGRAM Right 11/17/2013   Procedure: INTRA OPERATIVE ARTERIOGRAM;  Surgeon: Mal Misty, MD;  Location: [facility] OR;  Service: Vascular;  Laterality: Right;  . IR GENERIC HISTORICAL  12/11/2015   IR RADIOLOGIST EVAL & MGMT 12/11/2015 Arne Cleveland, MD [facility] INTERV RAD  . IR GENERIC HISTORICAL  12/24/2015   IR RADIOLOGIST EVAL & MGMT 12/24/2015 Jacqulynn Cadet, MD [facility] INTERV RAD  . IR GENERIC HISTORICAL  01/21/2016   IR RADIOLOGIST EVAL & MGMT 01/21/2016 [name], MD [facility] INTER[facility] RAD  . IR GENERIC HISTORICAL  02/04/2016   IR RADIOLOGIST EVAL & MGMT 02/04/2016 Sandi Mariscal, MD [facility] INTERV RAD  . IR GENERIC HISTORICAL  02/12/2016   IR CATHETER TUBE CHANGE 02/12/2016 Corrie Mckusick, DO [facility]  . IR GENERIC HISTORICAL  02/20/2016   IR RADIOLOGIST EVAL & MGMT 02/20/2016 Markus Daft, MD [facility] INTERV RAD  . LAPAROSCOPIC RIGHT HEMI COLECTOMY N/A 03/28/2013   Procedure: LAPAROSCOPIC ASSISTED HEMI COLECTOMY;  Surgeon: Pedro Earls, MD;  Location: [facility];  Service: General;  Laterality: N/A;  . LAPAROSCOPY N/A 11/05/2015   Procedure: LAPAROSCOPY DIAGNOSTIC;  Surgeon: [name], MD;  Location: [facility];  Service: General;  Laterality: N/A;  . LAPAROTOMY N/A 11/05/2015   Procedure: EXPLORATORY LAPAROTOMY, resection of mass at tail of pancreas;  Surgeon: [name], MD;  Location: [facility];  Service: General;  Laterality: N/A;  . LOWER EXTREMITY ANGIOGRAM Bilateral 08/09/2013  . TEE WITHOUT CARDIOVERSION N/A 08/10/2013   Procedure: TRANSESOPHAGEAL ECHOCARDIOGRAM (TEE);  Surgeon: Candee Furbish, MD;  Location: [facility] ENDOSCOPY;  Service: Cardiovascular;  Laterality: N/A;  . TONSILLECTOMY  ~ 1950  . [place]; 03/2013  . VASECTOMY    . VEIN HARVEST Right 11/17/2013   Procedure: HARVEST OF RIGHT UPPER EXTREMITY CEPHALIC VEIN;  Surgeon: Mal Misty, MD;  Location: [place];  Service: Vascular;  Laterality: Right;    Allergies: Amlodipine and Gabapentin  Medications: Prior to Admission medications   Medication Sig Start Date End Date Taking? Authorizing Provider  acetaminophen (TYLENOL) 500 MG tablet Take 500 mg by mouth every 6 (six) hours as needed.   Yes [provider]  colestipol (COLESTID) 1 g tablet TAKE 1 TABLET BY MOUTH 1 TO 2 TIMES A DAY, NOT WITHIN AN HOUR OF OTHER MEDS 10/25/16  Yes [provider]  doxylamine, Sleep, (UNISOM) 25 MG tablet Take 25 mg by mouth at bedtime as needed.   Yes [provider]  losartan (COZAAR) 100 MG tablet Take 1 tablet (100 mg total) by mouth daily. Patient taking differently: Take 50 mg by mouth daily.  03/02/16  Yes [name], MD  Multiple Vitamin (MULTIVITAMIN WITH MINERALS) TABS tablet Take 1 tablet by mouth daily.   Yes [provider]  predniSONE (DELTASONE) 10 MG tablet Take 5 mg by mouth daily.  03/29/17  Yes [provider]  sildenafil (VIAGRA) 50 MG tablet 1-2 tabs as needed for erectile dysfunction 05/05/17  Yes Midge Minium, MD  spironolactone (ALDACTONE) 25 MG tablet Take 1 tablet (25 mg total) by mouth daily. KEEP OV. 07/02/17  Yes [name], MD  sulfaSALAzine (AZULFIDINE) 500 MG EC tablet Take 1,500 mg by mouth 2 (two) times daily. 04/23/17  Yes [provider]  rivaroxaban (XARELTO) 20 MG TABS tablet Take 1 tablet (20 mg total) by mouth daily with breakfast. 01/18/17   [name], MD     Family History  Problem Relation Age of Onset  . Heart disease Mother   . Emphysema Father     Social History   Socioeconomic History  . Marital status: Married    Spouse name: Not on file  . Number of children: Not on file  . Years of education: Not on file  .  Highest education level: Not on file  Occupational History  . Not on file  Social Needs  . Financial resource strain: Not on file  . Food insecurity:    Worry: Not on file    Inability: Not on file  . Transportation needs:    Medical: Not on file    Non-medical: Not on file  Tobacco Use  . Smoking status: Never Smoker  . Smokeless tobacco: Never Used  . Tobacco comment: states his father died of emphysema from smoking, he did not want to be like that  Substance and Sexual Activity  . Alcohol use: No  . Drug use: No  . Sexual activity: Yes  Lifestyle  . Physical activity:    Days per week: Not on file    Minutes per session: Not on file  . Stress: Not on file  Relationships  . Social connections:    Talks on phone: Not on file    Gets together: Not on file    Attends religious service: Not on file  Active member of club or organization: Not on file    Attends meetings of clubs or organizations: Not on file    Relationship status: Not on file  Other Topics Concern  . Not on file  Social History Narrative  . Not on file     Review of Systems: A 12 point ROS discussed and pertinent positives are indicated in the HPI above.  All other systems are negative.  Review of Systems  Constitutional: Negative for appetite change, chills, fever and unexpected weight change.  Respiratory: Negative for cough and shortness of breath.   Cardiovascular: Negative for chest pain.  Gastrointestinal: Negative for abdominal distention, abdominal pain, diarrhea, nausea and vomiting.  Skin: Negative for rash.  Neurological: Negative for dizziness and light-headedness.  Psychiatric/Behavioral: Negative for confusion.    Vital Signs: BP (!) 152/78   Pulse 91   Temp 98.4 F (36.9 C) (Oral)   Resp 18   SpO2 97%   Physical Exam  Constitutional: He is oriented to person, place, and time. No distress.  HENT:  Head: Normocephalic.  Cardiovascular: Normal rate, regular rhythm and normal  heart sounds.  Pulmonary/Chest: Effort normal and breath sounds normal.  Abdominal: Soft. Bowel sounds are normal. He exhibits no distension and no mass. There is no tenderness.  Neurological: He is alert and oriented to person, place, and time.  Skin: Skin is warm and dry. He is not diaphoretic.  Psychiatric: He has a normal mood and affect. His behavior is normal. Judgment and thought content normal.  Vitals reviewed.    MD Evaluation Airway: WNL Heart: WNL Abdomen: WNL Chest/ Lungs: WNL ASA  Classification: 3 Mallampati/Airway Score: Two   Imaging: Ct Abdomen Pelvis W Contrast  Result Date: 09/29/2017 CLINICAL DATA:  Transverse colon cancer status post subtotal colectomy 03/28/2013 and resection of pancreatic tail metastasis 11/05/2015. Restaging. EXAM: CT ABDOMEN AND PELVIS WITH CONTRAST TECHNIQUE: Multidetector CT imaging of the abdomen and pelvis was performed using the standard protocol following bolus administration of intravenous contrast. CONTRAST:  143m OMNIPAQUE IOHEXOL 300 MG/ML  SOLN COMPARISON:  06/28/2017 CT chest, abdomen and pelvis. FINDINGS: Lower chest: Scattered small solid pulmonary nodules at both lung bases, largest 5 mm in the right middle lobe (series 7/image 5), all stable. Hepatobiliary: Normal liver size. No liver mass. Cholecystectomy. No biliary ductal dilatation. Stable small periampullary duodenal diverticulum. Pancreas: Status post pancreatic tail resection. Irregular 4.7 x 4.7 x 6.1 cm left upper quadrant mass interposed between the proximal stomach, spleen and pancreatic resection margin (series 2/image 22), previously 4.2 x 3.1 x 5.7 cm, increased. Stable coarse calcifications and surgical clips at the inferior margin of this mass. No pancreatic duct dilation. Spleen: Normal size spleen.  No discrete splenic parenchymal mass. Adrenals/Urinary Tract: Normal adrenals. No hydronephrosis. No renal masses. Normal bladder. Stomach/Bowel: Normal non-distended  stomach. Stable small paraumbilical hernia containing small bowel loops with no small bowel dilatation, pneumatosis, transition point or wall thickening. Normal appendix. Stable postsurgical changes friend transverse colectomy with intact left colonic anastomosis. Oral contrast transits to the distal colon. No large bowel wall thickening. Vascular/Lymphatic: Atherosclerotic nonaneurysmal abdominal aorta. Patent portal, splenic, hepatic and renal veins. No pathologically enlarged lymph nodes in the abdomen or pelvis. Reproductive: Top-normal size prostate. Other: No pneumoperitoneum, ascites or focal fluid collection. Musculoskeletal: No aggressive appearing focal osseous lesions. Mild thoracolumbar spondylosis. IMPRESSION: 1. Interval growth of left upper quadrant irregular soft tissue mass interposed between and abutting the proximal stomach, spleen and pancreatic tail resection site, worrisome for  recurrent tumor. Consider PET-CT and/or tissue sampling for further characterization. 2. No additional sites suspicious for metastatic disease in the abdomen or pelvis. 3. Scattered small pulmonary nodules at the lung bases are stable. 4. Stable periumbilical hernia containing small bowel loops without bowel complication. 5.  Aortic Atherosclerosis (ICD10-I70.0). Electronically Signed   By: Ilona Sorrel M.D.   On: 09/29/2017 20:41    Labs:  CBC: Recent Labs    10/22/16 1206 09/20/17 1032 10/19/17 0720  WBC 14.3* 11.1* 15.9*  HGB 12.8* 12.2* 12.7*  HCT 38.3 39.4 42.2  PLT 485* 377 454*    COAGS: Recent Labs    10/19/17 0720  INR 1.08    BMP: Recent Labs    10/22/16 1206 11/05/16 0855 09/20/17 1032 10/19/17 0720  NA 141 140 138 141  K 3.8 4.0 3.8 3.9  CL 99 95* 103 104  CO2 22 25 25 23   GLUCOSE 163* 179* 262* 158*  BUN 9 15 13 16   CALCIUM 8.8 9.7 9.6 9.6  CREATININE 1.06 1.15 1.25* 1.20  GFRNONAA 70 64 56* 59*  GFRAA 81 74 >60 >60    LIVER FUNCTION TESTS: Recent Labs     10/22/16 1206 09/20/17 1032 10/19/17 0720  BILITOT 0.2 0.3 0.6  AST 18 15 26   ALT 12 10 16   ALKPHOS 47 66 60  PROT 6.7 8.0 8.6*  ALBUMIN 3.7 3.7 4.2    TUMOR MARKERS: No results for input(s): AFPTM, CEA, CA199, CHROMGRNA in the last 8760 hours.  Assessment and Plan:  Patient with history of colon cancer previously treated with chemotherapy now with enlarging left upper quadrant mass on CT from 09/29/17 - request for Dr. Benay Spice for biopsy today. Patient reviewed by [name] who agrees to proceed with biopsy today.   He is afebrile, WBC elevated at 15.9 today although does take prednisone 10 mg QD, INR 1.08, last PO intake was last night around dinner time, last dose of Xarelto yesterday morning.  Risks and benefits discussed with the patient including, but not limited to bleeding, infection, damage to adjacent structures or low yield requiring additional tests.  All of the patient's questions were answered, patient is agreeable to proceed.  Consent signed and in chart.  Thank you for this interesting consult.  I greatly enjoyed meeting STEPHFON BOVEY and look forward to participating in their care.  A copy of this report was sent to the requesting provider on this date.  Electronically Signed: Joaquim Nam, PA-C 10/19/2017, 8:07 AM   I spent a total of  15 Minutes in face to face in clinical consultation, greater than 50% of which was counseling/coordinating care for left upper quadrant mass biopsy.

## 2017-10-19 NOTE — Procedures (Signed)
Interventional Radiology Procedure Note  Procedure: CT biopsy of LUQ mass.  Mx 57F bx Complications: None Recommendations:  - Ok to shower tomorrow - Do not submerge  - Routine care   Signed,  Dulcy Fanny. Earleen Newport, DO

## 2017-10-19 NOTE — Discharge Instructions (Signed)
Moderate Conscious Sedation, Adult, Care After These instructions provide you with information about caring for yourself after your procedure. Your health care provider may also give you more specific instructions. Your treatment has been planned according to current medical practices, but problems sometimes occur. Call your health care provider if you have any problems or questions after your procedure. What can I expect after the procedure? After your procedure, it is common:  To feel sleepy for several hours.  To feel clumsy and have poor balance for several hours.  To have poor judgment for several hours.  To vomit if you eat too soon.  Follow these instructions at home: For at least 24 hours after the procedure:   Do not: ? Participate in activities where you could fall or become injured. ? Drive. ? Use heavy machinery. ? Drink alcohol. ? Take sleeping pills or medicines that cause drowsiness. ? Make important decisions or sign legal documents. ? Take care of children on your own.  Rest. Eating and drinking  Follow the diet recommended by your health care provider.  If you vomit: ? Drink water, juice, or soup when you can drink without vomiting. ? Make sure you have little or no nausea before eating solid foods. General instructions  Have a responsible adult stay with you until you are awake and alert.  Take over-the-counter and prescription medicines only as told by your health care provider.  If you smoke, do not smoke without supervision.  Keep all follow-up visits as told by your health care provider. This is important. Contact a health care provider if:  You keep feeling nauseous or you keep vomiting.  You feel light-headed.  You develop a rash.  You have a fever. Get help right away if:  You have trouble breathing. This information is not intended to replace advice given to you by your health care provider. Make sure you discuss any questions you have  with your health care provider. Document Released: 10/12/2012 Document Revised: 05/27/2015 Document Reviewed: 04/13/2015 Elsevier Interactive Patient Education  2018 [employer].   Needle Biopsy, Care After These instructions give you information about caring for yourself after your procedure. Your doctor may also give you more specific instructions. Call your doctor if you have any problems or questions after your procedure. Follow these instructions at home:  Rest as told by your doctor.  Take medicines only as told by your doctor.  There are many different ways to close and cover the biopsy site, including stitches (sutures), skin glue, and adhesive strips. Follow instructions from your doctor about: ? How to take care of your biopsy site. ? When and how you should change your bandage (dressing).  You may remove your dressing tomorrow.  You may shower tomorrow. ? When you should remove your dressing. ? Removing whatever was used to close your biopsy site.  Check your biopsy site every day for signs of infection. Watch for: ? Redness, swelling, or pain. ? Fluid, blood, or pus. Contact a doctor if:  You have a fever.  You have redness, swelling, or pain at the biopsy site, and it lasts longer than a few days.  You have fluid, blood, or pus coming from the biopsy site.  You feel sick to your stomach (nauseous).  You throw up (vomit). Get help right away if:  You are short of breath.  You have trouble breathing.  Your chest hurts.  You feel dizzy or you pass out (faint).  You have bleeding that does  not stop with pressure or a bandage.  You cough up blood.  Your belly (abdomen) hurts. This information is not intended to replace advice given to you by your health care provider. Make sure you discuss any questions you have with your health care provider. Document Released: 12/05/2007 Document Revised: 05/30/2015 Document Reviewed: 12/18/2013 Elsevier Interactive  Patient Education  [employer].

## 2017-10-20 ENCOUNTER — Inpatient Hospital Stay (HOSPITAL_BASED_OUTPATIENT_CLINIC_OR_DEPARTMENT_OTHER): Payer: Medicare Other | Admitting: Oncology

## 2017-10-20 ENCOUNTER — Telehealth: Payer: Self-pay | Admitting: Oncology

## 2017-10-20 VITALS — BP 139/74 | HR 115 | Temp 98.2°F | Resp 18 | Ht 70.0 in | Wt 176.7 lb

## 2017-10-20 DIAGNOSIS — I10 Essential (primary) hypertension: Secondary | ICD-10-CM

## 2017-10-20 DIAGNOSIS — R1900 Intra-abdominal and pelvic swelling, mass and lump, unspecified site: Secondary | ICD-10-CM | POA: Diagnosis not present

## 2017-10-20 DIAGNOSIS — C184 Malignant neoplasm of transverse colon: Secondary | ICD-10-CM

## 2017-10-20 DIAGNOSIS — R918 Other nonspecific abnormal finding of lung field: Secondary | ICD-10-CM

## 2017-10-20 DIAGNOSIS — C7889 Secondary malignant neoplasm of other digestive organs: Secondary | ICD-10-CM

## 2017-10-20 NOTE — Progress Notes (Signed)
[place] OFFICE PROGRESS NOTE   Diagnosis: Colon cancer  INTERVAL HISTORY:   Cody Suarez returns as scheduled.  He underwent a CT-guided biopsy of the left upper quadrant soft tissue mass on 10/19/2017.  He reports tolerating the procedure well.  He has mild soreness at the biopsy site today.  No bleeding.  Objective:  Vital signs in last 24 hours:  Blood pressure 139/74, pulse (!) 115, temperature 98.2 F (36.8 C), temperature source Oral, resp. rate 18, height 5' 10"  (1.778 m), weight 176 lb 11.2 oz (80.2 kg), SpO2 97 %.    Resp: Lungs clear bilaterally Cardio: Regular rate and rhythm GI: No hepatosplenomegaly, no mass, nontender, biopsy site without bleeding Vascular: No leg edema    Lab Results:  Lab Results  Component Value Date   WBC 15.9 (H) 10/19/2017   HGB 12.7 (L) 10/19/2017   HCT 42.2 10/19/2017   MCV 82.1 10/19/2017   PLT 454 (H) 10/19/2017   NEUTROABS 12.4 (H) 10/19/2017    CMP  Lab Results  Component Value Date   NA 141 10/19/2017   K 3.9 10/19/2017   CL 104 10/19/2017   CO2 23 10/19/2017   GLUCOSE 158 (H) 10/19/2017   BUN 16 10/19/2017   CREATININE 1.20 10/19/2017   CALCIUM 9.6 10/19/2017   PROT 8.6 (H) 10/19/2017   ALBUMIN 4.2 10/19/2017   AST 26 10/19/2017   ALT 16 10/19/2017   ALKPHOS 60 10/19/2017   BILITOT 0.6 10/19/2017   GFRNONAA 59 (L) 10/19/2017   GFRAA >60 10/19/2017    Lab Results  Component Value Date   CEA1 3.63 09/20/2017    Lab Results  Component Value Date   INR 1.08 10/19/2017    Imaging:  Ct Biopsy  Result Date: 10/19/2017 INDICATION: 72 year old male with a history of possible cancer recurrence left upper quadrant. EXAM: CT BIOPSY MEDICATIONS: None. ANESTHESIA/SEDATION: Moderate (conscious) sedation was employed during this procedure. A total of Versed 2.0 mg and Fentanyl 100 mcg was administered intravenously. Moderate Sedation Time: 10 minutes. The patient's level of consciousness and vital  signs were monitored continuously by radiology nursing throughout the procedure under my direct supervision. FLUOROSCOPY TIME:  CT COMPLICATIONS: None PROCEDURE: Informed written consent was obtained from the patient after a thorough discussion of the procedural risks, benefits and alternatives. All questions were addressed. Maximal Sterile Barrier Technique was utilized including caps, mask, sterile gowns, sterile gloves, sterile drape, hand hygiene and skin antiseptic. A timeout was performed prior to the initiation of the procedure. Patient positioned supine position on the CT gantry table. Scout CT of the abdomen performed for planning purposes. 1% lidocaine was used for local anesthesia. Guide needle was advanced into the soft tissue in the hilum of the spleen. Once we confirmed the needle tip position, multiple 18 gauge core biopsy were acquired. Needle was removed and a sterile bandage was placed. Patient tolerated the procedure well and remained hemodynamically stable throughout. No complications were encountered and no significant blood loss. IMPRESSION: Status post CT-guided biopsy of tissue in the splenic hilum. Tissue specimen sent to pathology for complete histopathologic analysis. Signed, Dulcy Fanny. Dellia Nims, RPVI [facility] Electronically Signed   By: Corrie Mckusick D.O.   On: 10/19/2017 10:52    Medications: I have reviewed the patient's current medications.   Assessment/Plan:  1. 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 Negative for a BRAF mutation  Tumor invaded through the muscularis propria into pericolonic fatty tissue and involved the attached omentum.   Cycle 1 adjuvant Xeloda 05/28/2013.   Cycle 2 adjuvant Xeloda 06/18/2013.   Cycle 3  adjuvant Xeloda 07/09/2013.   Cycle 4 adjuvant Xeloda 07/30/2013.   Cycle 5 adjuvant Xeloda 08/20/2013.  Cycle 6 adjuvant Xeloda 09/11/2013.  cycle 7 adjuvant Xeloda 10/02/2013  Cycle 8 adjuvant Xeloda 10/23/2013    CTs of the chest, abdomen, and pelvis on 07/18/2014-negative for recurrent colon cancer   CTs abdomen/pelvis 09/06/2014 showed acute cholecystitis.  Colonoscopy 08/02/2015-chronic colitis, no malignancy  CTs 09/17/2015, new mass near the tail the pancreas, stable lung nodules  Biopsy mesenteric mass 10/04/2015 with metastatic adenocarcinoma consistent with colorectal primary  PET scan 10/10/2015 with soft tissue thickening within the peritoneal space of the left upper quadrant with SUV Max 7.9; no additional sites of peritoneal nodularity to suggest metastasis; hypermetabolic ill-defined nodule in the left lower lobe favored inflammatory.  Resection of the left abdominal mesenteric mass/tail the pancreas on 11/05/2015 with the pathology confirming metastatic colon cancer, positive "serosal" margin  CTs 07/29/2016-no evidence of metastatic disease, stable small lung nodules, decreased size of pancreatic resection bed fluid/mass  CTs 06/28/2017- decreased fluid and increased soft tissue at the area of the distal pancreatectomy with a few foci of gas  CT 09/29/2017- enlargement of left upper quadrant soft tissue mass between the stomach, spleen, and pancreas tail, stable nodules at the lung bases  To get a biopsy of the upper quadrant mass 10/19/2017-metastatic colon cancer 2. Ulcerative colitis-extensive chronic active ulcerative colitis was noted on the colon resection specimen 03/28/2013.  Followed by Dr.Magod 3. Hypertension.  4. History of microcytic anemia-likely iron deficiency, unable to tolerate oral iron. Improved. 5. Possible area of cecal wall thickening noted on abdominal CT 03/20/2013. 6. Family history of colon cancer (maternal grandfather died in  his 63s with colon cancer). 7. Bilateral calf and low anterior leg pain. Bilateral lower extremity venous duplex negative for DVT 07/14/2013. Right ABI with moderate, borderline severe arterial insufficiency; left ABI suggestive of moderate arterial insufficiency. He was referred to vascular. 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 TEE showed thrombus in the descending aorta. He underwent right popliteal to peroneal artery bypass graft, thrombectomy anterior tibialis and attempted thrombectomy tibioperoneal trunk and posterior tibialis on 08/11/2013. Right calf pain 09/26/2013 with findings of occlusion of right popliteal to peroneal bypass status post thrombolysis. Now on anticoagulation with Xarelto.  recurrent pain and ulceration at the right foot, status post a popliteal to posterior tibial artery bypass using a cephalic vein graft on 50/35/4656 8. Status post laparoscopic cholecystectomy 09/08/2014 9. Anorexia following the mesenteric mass/pancreas tail resection-improved 10. Abdominal abscess November 2017-pancreas "cysts "drainage catheter placed 11/26/2015; CT 02/04/2016 with no change to slight decreasein size of residual pancreatic tail fluid collection.Drainage catheter removed approximately early March 2018  CT 02/20/2016-new small left pleural effusion, increased size of the surgical bed fluid collection, indeterminate pulmonary nodules  CT 07/29/2016-decreased fluid/soft tissue density near the pancreas is no evidence of metastatic disease, stable subcentimeter lung nodules   Disposition: Mr. Hughlett has been diagnosed with recurrent colon cancer.  He appears to have disease localized to a soft tissue mass near the spleen.  We discussed treatment options.  I do not think he is a candidate for repeat surgery based on the prolonged illness following the surgery in 2017.  He understands no therapy  will be curative.  We discussed observation and beginning systemic  therapy.  We will request the pathology from 10/19/2017 be submitted for Foundation 1 testing.  He will return for an office visit and further discussion on 11/11/2017.  We discussed placement of a Port-A-Cath when he begins systemic therapy.  Betsy Coder, MD  10/20/2017  12:48 PM

## 2017-10-20 NOTE — Telephone Encounter (Signed)
Pt sched per 10/16 los

## 2017-10-21 ENCOUNTER — Other Ambulatory Visit: Payer: Self-pay | Admitting: Cardiovascular Disease

## 2017-10-21 DIAGNOSIS — I10 Essential (primary) hypertension: Secondary | ICD-10-CM

## 2017-10-21 MED ORDER — LOSARTAN POTASSIUM 100 MG PO TABS: 50.0000 mg | ORAL_TABLET | Freq: Every day | ORAL | 2 refills | Status: DC

## 2017-10-21 NOTE — Telephone Encounter (Signed)
°*  STAT* If patient is at the pharmacy, call can be transferred to refill team.   1. Which medications need to be refilled? (please list name of each medication and dose if known)needs a new prescription for Losartan - please let pt know when you call this in please-  2. Which pharmacy/location (including street and city if local pharmacy) is medication to be sent to? [facility], [place] 3. Do they need a 30 day or 90 day supply? 90 and refills

## 2017-10-28 ENCOUNTER — Telehealth: Payer: Self-pay | Admitting: Cardiovascular Disease

## 2017-10-28 NOTE — Telephone Encounter (Signed)
Patient was informed.

## 2017-10-28 NOTE — Telephone Encounter (Signed)
Patient calling the office for samples of medication: ° ° °1.  What medication and dosage are you requesting samples for? Xarelto ° °2.  Are you currently out of this medication?  1 left ° ° ° °

## 2017-10-28 NOTE — Telephone Encounter (Signed)
Samples are at the front desk.

## 2017-11-05 DIAGNOSIS — C184 Malignant neoplasm of transverse colon: Secondary | ICD-10-CM | POA: Diagnosis not present

## 2017-11-05 DIAGNOSIS — C7889 Secondary malignant neoplasm of other digestive organs: Secondary | ICD-10-CM | POA: Diagnosis not present

## 2017-11-08 ENCOUNTER — Ambulatory Visit: Payer: Medicare Other | Admitting: Family Medicine

## 2017-11-11 ENCOUNTER — Encounter (HOSPITAL_COMMUNITY): Payer: Self-pay | Admitting: Oncology

## 2017-11-11 ENCOUNTER — Inpatient Hospital Stay: Payer: Medicare Other | Attending: Oncology | Admitting: Oncology

## 2017-11-11 ENCOUNTER — Telehealth: Payer: Self-pay | Admitting: Oncology

## 2017-11-11 VITALS — BP 148/63 | HR 89 | Temp 97.9°F | Resp 19 | Ht 70.0 in | Wt 173.5 lb

## 2017-11-11 DIAGNOSIS — R1900 Intra-abdominal and pelvic swelling, mass and lump, unspecified site: Secondary | ICD-10-CM | POA: Insufficient documentation

## 2017-11-11 DIAGNOSIS — I10 Essential (primary) hypertension: Secondary | ICD-10-CM | POA: Diagnosis not present

## 2017-11-11 DIAGNOSIS — C7889 Secondary malignant neoplasm of other digestive organs: Secondary | ICD-10-CM

## 2017-11-11 DIAGNOSIS — R918 Other nonspecific abnormal finding of lung field: Secondary | ICD-10-CM | POA: Insufficient documentation

## 2017-11-11 DIAGNOSIS — C184 Malignant neoplasm of transverse colon: Secondary | ICD-10-CM | POA: Diagnosis not present

## 2017-11-11 NOTE — Telephone Encounter (Signed)
Appts scheduled avs/calendar printed/contrast provided per 11/7 los

## 2017-11-11 NOTE — Progress Notes (Signed)
[place] OFFICE PROGRESS NOTE   Diagnosis: Colon cancer  INTERVAL HISTORY:   Cody Suarez turns as scheduled.  He feels well.  No complaint.  He is here today with multiple family members.  Objective:  Vital signs in last 24 hours:  Blood pressure (!) 148/63, pulse 89, temperature 97.9 F (36.6 C), temperature source Oral, resp. rate 19, height 5' 10"  (1.778 m), weight 173 lb 8 oz (78.7 kg), SpO2 100 %.    Resp: Lungs clear bilaterally Cardio: Regular rate and rhythm GI: No hepatomegaly, no mass, nontender Vascular: No leg edema   Lab Results:  Lab Results  Component Value Date   WBC 15.9 (H) 10/19/2017   HGB 12.7 (L) 10/19/2017   HCT 42.2 10/19/2017   MCV 82.1 10/19/2017   PLT 454 (H) 10/19/2017   NEUTROABS 12.4 (H) 10/19/2017    CMP  Lab Results  Component Value Date   NA 141 10/19/2017   K 3.9 10/19/2017   CL 104 10/19/2017   CO2 23 10/19/2017   GLUCOSE 158 (H) 10/19/2017   BUN 16 10/19/2017   CREATININE 1.20 10/19/2017   CALCIUM 9.6 10/19/2017   PROT 8.6 (H) 10/19/2017   ALBUMIN 4.2 10/19/2017   AST 26 10/19/2017   ALT 16 10/19/2017   ALKPHOS 60 10/19/2017   BILITOT 0.6 10/19/2017   GFRNONAA 59 (L) 10/19/2017   GFRAA >60 10/19/2017    Lab Results  Component Value Date   CEA1 3.63 09/20/2017    Lab Results  Component Value Date   INR 1.08 10/19/2017    Imaging:  No results found.  Medications: I have reviewed the patient's current medications.   Assessment/Plan: 1. 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 Negative for a BRAF mutation  Tumor invaded through the muscularis propria into pericolonic fatty tissue and involved the attached omentum.   Cycle 1 adjuvant Xeloda 05/28/2013.   Cycle 2 adjuvant Xeloda 06/18/2013.   Cycle 3  adjuvant Xeloda 07/09/2013.   Cycle 4 adjuvant Xeloda 07/30/2013.   Cycle 5 adjuvant Xeloda 08/20/2013.  Cycle 6 adjuvant Xeloda 09/11/2013.  cycle 7 adjuvant Xeloda 10/02/2013  Cycle 8 adjuvant Xeloda 10/23/2013  CTs of the chest, abdomen, and pelvis on 07/18/2014-negative for recurrent colon cancer   CTs abdomen/pelvis 09/06/2014 showed acute cholecystitis.  Colonoscopy 08/02/2015-chronic colitis, no malignancy  CTs 09/17/2015, new mass near the tail the pancreas, stable lung nodules  Biopsy mesenteric mass 10/04/2015 with metastatic adenocarcinoma consistent with colorectal primary  PET scan 10/10/2015 with soft tissue thickening within the peritoneal space of the left upper quadrant with SUV Max 7.9; no additional sites of peritoneal nodularity to suggest metastasis; hypermetabolic ill-defined nodule in the left lower lobe favored inflammatory.  Resection of the left abdominal mesenteric mass/tail the pancreas on 11/05/2015 with the pathology confirming metastatic colon cancer, positive "serosal" margin  CTs 07/29/2016-no evidence of metastatic disease, stable small lung nodules, decreased size of pancreatic resection bed fluid/mass  CTs 06/28/2017-decreased fluid and increased soft tissue at the area of the distal pancreatectomy with a few foci of gas  CT 09/29/2017- enlargement of left upper quadrant soft tissue mass between the stomach, spleen, and pancreas tail, stable nodules at the lung bases  CT biopsy of the upper quadrant mass 10/19/2017-metastatic colon cancer, MSS, tumor mutation burden 1, no KRAS, BRAF,  or NRAS mutation 2. Ulcerative colitis-extensive chronic active  ulcerative colitis was noted on the colon resection specimen 03/28/2013.  Followed by Dr.Magod 3. Hypertension.  4. History of microcytic anemia-likely iron deficiency, unable to tolerate oral iron. Improved. 5. Possible area of cecal wall thickening noted on abdominal CT 03/20/2013. 6. Family  history of colon cancer (maternal grandfather died in his 27s with colon cancer). 7. Bilateral calf and low anterior leg pain. Bilateral lower extremity venous duplex negative for DVT 07/14/2013. Right ABI with moderate, borderline severe arterial insufficiency; left ABI suggestive of moderate arterial insufficiency. He was referred to vascular. 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 TEE showed thrombus in the descending aorta. He underwent right popliteal to peroneal artery bypass graft, thrombectomy anterior tibialis and attempted thrombectomy tibioperoneal trunk and posterior tibialis on 08/11/2013. Right calf pain 09/26/2013 with findings of occlusion of right popliteal to peroneal bypass status post thrombolysis. Now on anticoagulation with Xarelto.  recurrent pain and ulceration at the right foot, status post a popliteal to posterior tibial artery bypass using a cephalic vein graft on 39/03/90 8. Status post laparoscopic cholecystectomy 09/08/2014 9. Anorexia following the mesenteric mass/pancreas tail resection-improved 10. Abdominal abscess November 2017-pancreas "cysts "drainage catheter placed 11/26/2015; CT 02/04/2016 with no change to slight decreasein size of residual pancreatic tail fluid collection.Drainage catheter removed approximately early March 2018  CT 02/20/2016-new small left pleural effusion, increased size of the surgical bed fluid collection, indeterminate pulmonary nodules  CT 07/29/2016-decreased fluid/soft tissue density near the pancreas is no evidence of metastatic disease, stable subcentimeter lung nodules     Disposition: Cody Suarez has metastatic colon cancer.  He appears to have metastatic disease restricted to an area of local recurrence in the left abdomen.  He is asymptomatic.  I reviewed the Foundation 1 testing results with Cody Suarez and his family.  We discussed treatment options.  He does not wish to consider surgery.  I  reviewed the case with Dr. Hassell Done he confirmed surgical resection would be difficult and potentially not possible.  I discussed systemic treatment options.  We specifically discussed the FOLFOX regimen.  I reviewed potential toxicities associated with FOLFOX including the chance for nausea/vomiting, mucositis, diarrhea, alopecia, and hematologic toxicity.  We discussed the sun sensitivity, hyperpigmentation, and hand/foot syndrome associated with 5-fluorouracil.  We reviewed the various types of neuropathy associated with oxaliplatin.  We discussed options of proceeding with chemotherapy now versus at a later date.  He is comfortable with observation.  He will be scheduled for a restaging CT during the last week of December 2019.  He will return for an office visit and further discussion after the CT.  25 minutes were spent with the patient today.  The majority of the time was used for counseling and coordination of care.  Betsy Coder, MD  11/11/2017  2:04 PM

## 2017-11-12 ENCOUNTER — Ambulatory Visit (INDEPENDENT_AMBULATORY_CARE_PROVIDER_SITE_OTHER): Payer: Medicare Other | Admitting: Family Medicine

## 2017-11-12 ENCOUNTER — Encounter: Payer: Self-pay | Admitting: Family Medicine

## 2017-11-12 ENCOUNTER — Other Ambulatory Visit: Payer: Self-pay

## 2017-11-12 VITALS — BP 130/62 | HR 63 | Temp 98.2°F | Resp 16 | Ht 70.0 in | Wt 174.5 lb

## 2017-11-12 DIAGNOSIS — I10 Essential (primary) hypertension: Secondary | ICD-10-CM | POA: Diagnosis not present

## 2017-11-12 DIAGNOSIS — I779 Disorder of arteries and arterioles, unspecified: Secondary | ICD-10-CM | POA: Diagnosis not present

## 2017-11-12 DIAGNOSIS — Z125 Encounter for screening for malignant neoplasm of prostate: Secondary | ICD-10-CM | POA: Diagnosis not present

## 2017-11-12 DIAGNOSIS — E785 Hyperlipidemia, unspecified: Secondary | ICD-10-CM | POA: Diagnosis not present

## 2017-11-12 DIAGNOSIS — I7025 Atherosclerosis of native arteries of other extremities with ulceration: Secondary | ICD-10-CM

## 2017-11-12 LAB — PSA, MEDICARE: PSA: 6.79 ng/mL — AB (ref 0.10–4.00)

## 2017-11-12 LAB — LIPID PANEL
CHOLESTEROL: 137 mg/dL (ref 0–200)
HDL: 29.2 mg/dL — ABNORMAL LOW (ref >39.00)
NonHDL: 107.63
TRIGLYCERIDES: 219 mg/dL — AB (ref 0.0–149.0)
Total CHOL/HDL Ratio: 5
VLDL: 43.8 mg/dL — ABNORMAL HIGH (ref 0.0–40.0)

## 2017-11-12 LAB — LDL CHOLESTEROL, DIRECT: LDL DIRECT: 91 mg/dL

## 2017-11-12 LAB — TSH: TSH: 1.82 u[IU]/mL (ref 0.35–4.50)

## 2017-11-12 NOTE — Assessment & Plan Note (Signed)
Chronic problem.  Well controlled.  Currently asymptomatic.  Check labs.  No anticipated med changes.  Will follow.

## 2017-11-12 NOTE — Assessment & Plan Note (Signed)
Chronic problem.  On Colestipol 1 gm BID.  Check labs.  Adjust meds prn.

## 2017-11-12 NOTE — Assessment & Plan Note (Signed)
Following w/ Vascular.  On Xarelto.

## 2017-11-12 NOTE — Progress Notes (Signed)
   Subjective:    Patient ID: Cody Suarez, male    DOB: 1945/09/10, 72 y.o.   MRN: [id number]  HPI HTN- chronic problem, on Losartan 145m 1/2 tab daily, Spironolactone 267mdaily w/ good control.  No CP, SOB, HAs, visual changes, edema.  PVD- currently on Xarelto.  Following w/ Vascular.  No sores or ulcers of legs.  Hyperlipidemia- chronic problem, on Colestipol 1 gm BID.  No abd pain, N/V.   Review of Systems For ROS see HPI     Objective:   Physical Exam  Constitutional: He is oriented to person, place, and time. He appears well-developed and well-nourished. No distress.  HENT:  Head: Normocephalic and atraumatic.  Eyes: Pupils are equal, round, and reactive to light. Conjunctivae and EOM are normal.  Neck: Normal range of motion. Neck supple. No thyromegaly present.  Cardiovascular: Normal rate, regular rhythm, normal heart sounds and intact distal pulses.  No murmur heard. Pulmonary/Chest: Effort normal and breath sounds normal. No respiratory distress.  Abdominal: Soft. Bowel sounds are normal. He exhibits no distension.  Musculoskeletal: He exhibits no edema.  Lymphadenopathy:    He has no cervical adenopathy.  Neurological: He is alert and oriented to person, place, and time. No cranial nerve deficit.  Skin: Skin is warm and dry.  Psychiatric: He has a normal mood and affect. His behavior is normal.  Vitals reviewed.         Assessment & Plan:

## 2017-11-12 NOTE — Patient Instructions (Addendum)
Follow up in 1 year or as needed We'll notify you of your lab results and make any changes if needed Continue to work on healthy diet and regular exercise- you look great! GOOD LUCK WITH TREATMENT!! Call with any questions or concerns Happy Holidays!

## 2017-11-15 ENCOUNTER — Other Ambulatory Visit: Payer: Self-pay | Admitting: Family Medicine

## 2017-11-15 DIAGNOSIS — R972 Elevated prostate specific antigen [PSA]: Secondary | ICD-10-CM

## 2017-11-17 ENCOUNTER — Telehealth: Payer: Self-pay | Admitting: Family Medicine

## 2017-11-17 NOTE — Telephone Encounter (Signed)
Pt. returned call to receive lab results.  Informed of result note per Dr. Birdie Riddle on 11/15/17.  Verb. Understanding of results; reported he has looked the results up on MyChart.  The pt. stated "my cancer has come back, and I am under Dr. Gearldine Shown care."  "He is doing blood work and CT scan on me, and I will start Chemotherapy in January."  He stated he did not want to go to Urology at this time.  Stated "I've got enough going on, and Dr. Benay Spice has me under his thumb."  Advised will make Dr. Birdie Riddle aware of his situation.  Pt. agreed with plan.

## 2017-11-23 NOTE — Telephone Encounter (Signed)
noted 

## 2017-11-26 MED ORDER — RIVAROXABAN 20 MG PO TABS: 20.0000 mg | ORAL_TABLET | Freq: Every day | ORAL | 0 refills | Status: DC

## 2017-11-26 NOTE — Telephone Encounter (Signed)
Spoke with patient . Samples available for pick up. Patient states he has not purchase medication this year due the cost $ 400 month . He states has not meet deductible.  RN discussed with patient possible unavailability of the medication.

## 2017-11-26 NOTE — Telephone Encounter (Signed)
Patient calling the office for samples of medication:   1.  What medication and dosage are you requesting samples for?  Xarelto  2.  Are you currently out of this medication?  1  Pill left

## 2017-12-08 ENCOUNTER — Other Ambulatory Visit: Payer: Self-pay | Admitting: Cardiovascular Disease

## 2017-12-08 NOTE — Telephone Encounter (Signed)
Rx request sent to pharmacy.  

## 2017-12-08 NOTE — Telephone Encounter (Signed)
Please review for refill, Thanks !  

## 2017-12-10 ENCOUNTER — Telehealth: Payer: Self-pay | Admitting: Cardiovascular Disease

## 2017-12-10 NOTE — Telephone Encounter (Signed)
Two bottles of xarelto 20 mg placed at the front desk for the patient.

## 2017-12-10 NOTE — Telephone Encounter (Signed)
No Xarelto available at [facility] location.   Samples available at [facility].   Patient aware office will close at 1130 today.

## 2017-12-10 NOTE — Telephone Encounter (Signed)
New Message          Patient calling the office for samples of medication:   1.  What medication and dosage are you requesting samples for? Xarelto 20 mg 2.  Are you currently out of this medication? 1 left

## 2017-12-21 ENCOUNTER — Telehealth: Payer: Self-pay | Admitting: Cardiovascular Disease

## 2017-12-21 NOTE — Telephone Encounter (Signed)
New message   Patient calling the office for samples of medication:   1.  What medication and dosage are you requesting samples for?rivaroxaban (XARELTO) 20 MG TABS tablet  2.  Are you currently out of this medication? No, patient will be out of pills on this Thursday

## 2017-12-21 NOTE — Telephone Encounter (Signed)
Spoke with patient and advised 2 weeks of samples at front desk. Lot # [id number] exp 6/21. Did explain to patient that he needs to try to get patient assistance or try to set aside money for deductible as we can not always supply him with samples. Patient stated he would reach out to his insurance and try patient assistance again, was denied previously. He wants to stay on Xarelto and not change

## 2017-12-24 ENCOUNTER — Emergency Department (HOSPITAL_COMMUNITY): Payer: Medicare Other

## 2017-12-24 ENCOUNTER — Encounter (HOSPITAL_COMMUNITY): Payer: Self-pay | Admitting: *Deleted

## 2017-12-24 ENCOUNTER — Encounter: Payer: Self-pay | Admitting: Physician Assistant

## 2017-12-24 ENCOUNTER — Ambulatory Visit (INDEPENDENT_AMBULATORY_CARE_PROVIDER_SITE_OTHER): Payer: Medicare Other | Admitting: Physician Assistant

## 2017-12-24 ENCOUNTER — Inpatient Hospital Stay (HOSPITAL_COMMUNITY)
Admission: EM | Admit: 2017-12-24 | Discharge: 2017-12-28 | DRG: 871 | Disposition: A | Discharge status: Home / Self Care | Payer: Medicare Other | Source: Ambulatory Visit | Attending: Internal Medicine | Admitting: Internal Medicine

## 2017-12-24 ENCOUNTER — Other Ambulatory Visit: Payer: Self-pay

## 2017-12-24 VITALS — BP 110/60 | HR 120 | Temp 98.3°F | Resp 18 | Ht 70.0 in | Wt 170.0 lb

## 2017-12-24 DIAGNOSIS — D630 Anemia in neoplastic disease: Secondary | ICD-10-CM | POA: Diagnosis present

## 2017-12-24 DIAGNOSIS — Z888 Allergy status to other drugs, medicaments and biological substances status: Secondary | ICD-10-CM

## 2017-12-24 DIAGNOSIS — E860 Dehydration: Secondary | ICD-10-CM

## 2017-12-24 DIAGNOSIS — Z9049 Acquired absence of other specified parts of digestive tract: Secondary | ICD-10-CM

## 2017-12-24 DIAGNOSIS — Z8249 Family history of ischemic heart disease and other diseases of the circulatory system: Secondary | ICD-10-CM

## 2017-12-24 DIAGNOSIS — Z9221 Personal history of antineoplastic chemotherapy: Secondary | ICD-10-CM

## 2017-12-24 DIAGNOSIS — R0902 Hypoxemia: Secondary | ICD-10-CM | POA: Diagnosis not present

## 2017-12-24 DIAGNOSIS — D638 Anemia in other chronic diseases classified elsewhere: Secondary | ICD-10-CM | POA: Diagnosis present

## 2017-12-24 DIAGNOSIS — A419 Sepsis, unspecified organism: Secondary | ICD-10-CM | POA: Diagnosis not present

## 2017-12-24 DIAGNOSIS — I739 Peripheral vascular disease, unspecified: Secondary | ICD-10-CM | POA: Diagnosis present

## 2017-12-24 DIAGNOSIS — E872 Acidosis: Secondary | ICD-10-CM | POA: Diagnosis present

## 2017-12-24 DIAGNOSIS — K5190 Ulcerative colitis, unspecified, without complications: Secondary | ICD-10-CM | POA: Diagnosis present

## 2017-12-24 DIAGNOSIS — Z79899 Other long term (current) drug therapy: Secondary | ICD-10-CM

## 2017-12-24 DIAGNOSIS — Z800 Family history of malignant neoplasm of digestive organs: Secondary | ICD-10-CM

## 2017-12-24 DIAGNOSIS — J9811 Atelectasis: Secondary | ICD-10-CM | POA: Diagnosis present

## 2017-12-24 DIAGNOSIS — K651 Peritoneal abscess: Secondary | ICD-10-CM | POA: Diagnosis not present

## 2017-12-24 DIAGNOSIS — Z87442 Personal history of urinary calculi: Secondary | ICD-10-CM | POA: Diagnosis not present

## 2017-12-24 DIAGNOSIS — R0682 Tachypnea, not elsewhere classified: Secondary | ICD-10-CM

## 2017-12-24 DIAGNOSIS — Z86718 Personal history of other venous thrombosis and embolism: Secondary | ICD-10-CM

## 2017-12-24 DIAGNOSIS — I10 Essential (primary) hypertension: Secondary | ICD-10-CM | POA: Diagnosis present

## 2017-12-24 DIAGNOSIS — Z90411 Acquired partial absence of pancreas: Secondary | ICD-10-CM | POA: Diagnosis not present

## 2017-12-24 DIAGNOSIS — R000 Tachycardia, unspecified: Secondary | ICD-10-CM | POA: Diagnosis not present

## 2017-12-24 DIAGNOSIS — A4150 Gram-negative sepsis, unspecified: Secondary | ICD-10-CM | POA: Diagnosis not present

## 2017-12-24 DIAGNOSIS — Z85038 Personal history of other malignant neoplasm of large intestine: Secondary | ICD-10-CM | POA: Diagnosis not present

## 2017-12-24 DIAGNOSIS — D509 Iron deficiency anemia, unspecified: Secondary | ICD-10-CM | POA: Diagnosis present

## 2017-12-24 DIAGNOSIS — R509 Fever, unspecified: Secondary | ICD-10-CM | POA: Diagnosis not present

## 2017-12-24 DIAGNOSIS — Z7952 Long term (current) use of systemic steroids: Secondary | ICD-10-CM

## 2017-12-24 DIAGNOSIS — Z7901 Long term (current) use of anticoagulants: Secondary | ICD-10-CM

## 2017-12-24 DIAGNOSIS — C189 Malignant neoplasm of colon, unspecified: Secondary | ICD-10-CM

## 2017-12-24 DIAGNOSIS — I779 Disorder of arteries and arterioles, unspecified: Secondary | ICD-10-CM

## 2017-12-24 DIAGNOSIS — R110 Nausea: Secondary | ICD-10-CM | POA: Diagnosis not present

## 2017-12-24 DIAGNOSIS — J189 Pneumonia, unspecified organism: Secondary | ICD-10-CM | POA: Diagnosis present

## 2017-12-24 DIAGNOSIS — R109 Unspecified abdominal pain: Secondary | ICD-10-CM | POA: Diagnosis not present

## 2017-12-24 DIAGNOSIS — C7889 Secondary malignant neoplasm of other digestive organs: Secondary | ICD-10-CM | POA: Diagnosis present

## 2017-12-24 DIAGNOSIS — Z825 Family history of asthma and other chronic lower respiratory diseases: Secondary | ICD-10-CM

## 2017-12-24 DIAGNOSIS — R531 Weakness: Secondary | ICD-10-CM | POA: Diagnosis not present

## 2017-12-24 LAB — COMPREHENSIVE METABOLIC PANEL
ALBUMIN: 3 g/dL — AB (ref 3.5–5.0)
ALT: 13 U/L (ref 0–44)
AST: 15 U/L (ref 15–41)
Alkaline Phosphatase: 75 U/L (ref 38–126)
Anion gap: 15 (ref 5–15)
BUN: 12 mg/dL (ref 8–23)
CO2: 22 mmol/L (ref 22–32)
CREATININE: 1.29 mg/dL — AB (ref 0.61–1.24)
Calcium: 8.7 mg/dL — ABNORMAL LOW (ref 8.9–10.3)
Chloride: 94 mmol/L — ABNORMAL LOW (ref 98–111)
GFR calc Af Amer: >60 mL/min (ref >60)
GFR calc non Af Amer: 55 mL/min — ABNORMAL LOW (ref >60)
GLUCOSE: 162 mg/dL — AB (ref 70–99)
Potassium: 3.8 mmol/L (ref 3.5–5.1)
Sodium: 131 mmol/L — ABNORMAL LOW (ref 135–145)
Total Bilirubin: 1 mg/dL (ref 0.3–1.2)
Total Protein: 7.8 g/dL (ref 6.5–8.1)

## 2017-12-24 LAB — PROTIME-INR
INR: 3.45
Prothrombin Time: 34.2 seconds — ABNORMAL HIGH (ref 11.4–15.2)

## 2017-12-24 LAB — CBC WITH DIFFERENTIAL/PLATELET
ABS IMMATURE GRANULOCYTES: 0 10*3/uL (ref 0.00–0.07)
Basophils Absolute: 0.5 10*3/uL — ABNORMAL HIGH (ref 0.0–0.1)
Basophils Relative: 2 %
Eosinophils Absolute: 0 10*3/uL (ref 0.0–0.5)
Eosinophils Relative: 0 %
HCT: 37.2 % — ABNORMAL LOW (ref 39.0–52.0)
Hemoglobin: 11.8 g/dL — ABNORMAL LOW (ref 13.0–17.0)
Lymphocytes Relative: 6 %
Lymphs Abs: 1.5 10*3/uL (ref 0.7–4.0)
MCH: 25.4 pg — ABNORMAL LOW (ref 26.0–34.0)
MCHC: 31.7 g/dL (ref 30.0–36.0)
MCV: 80.2 fL (ref 80.0–100.0)
MONOS PCT: 11 %
Monocytes Absolute: 2.8 10*3/uL — ABNORMAL HIGH (ref 0.1–1.0)
Neutro Abs: 20.4 10*3/uL — ABNORMAL HIGH (ref 1.7–7.7)
Neutrophils Relative %: 81 %
Platelets: 493 10*3/uL — ABNORMAL HIGH (ref 150–400)
RBC: 4.64 MIL/uL (ref 4.22–5.81)
RDW: 18.8 % — ABNORMAL HIGH (ref 11.5–15.5)
WBC: 25.2 10*3/uL — ABNORMAL HIGH (ref 4.0–10.5)
nRBC: 0 % (ref 0.0–0.2)
nRBC: 0 /100 WBC

## 2017-12-24 LAB — URINALYSIS, ROUTINE W REFLEX MICROSCOPIC
Bilirubin Urine: NEGATIVE
Glucose, UA: NEGATIVE mg/dL
Hgb urine dipstick: NEGATIVE
Ketones, ur: 20 mg/dL — AB
LEUKOCYTES UA: NEGATIVE
Nitrite: NEGATIVE
PH: 6 (ref 5.0–8.0)
Protein, ur: NEGATIVE mg/dL
Specific Gravity, Urine: 1.012 (ref 1.005–1.030)

## 2017-12-24 LAB — I-STAT TROPONIN, ED: Troponin i, poc: 0.01 ng/mL (ref 0.00–0.08)

## 2017-12-24 LAB — INFLUENZA PANEL BY PCR (TYPE A & B)
Influenza A By PCR: NEGATIVE
Influenza B By PCR: NEGATIVE

## 2017-12-24 LAB — I-STAT CG4 LACTIC ACID, ED: Lactic Acid, Venous: 1.93 mmol/L — ABNORMAL HIGH (ref 0.5–1.9)

## 2017-12-24 MED ORDER — SODIUM CHLORIDE 0.9 % IV BOLUS (SEPSIS)
1000.0000 mL | Freq: Once | INTRAVENOUS | Status: AC
  Administered 2017-12-24: 1000 mL via INTRAVENOUS

## 2017-12-24 MED ORDER — SODIUM CHLORIDE 0.9 % IV BOLUS (SEPSIS)
500.0000 mL | Freq: Once | INTRAVENOUS | Status: AC
  Administered 2017-12-24: 500 mL via INTRAVENOUS

## 2017-12-24 MED ORDER — ADULT MULTIVITAMIN W/MINERALS CH
1.0000 | ORAL_TABLET | Freq: Every day | ORAL | Status: DC
  Administered 2017-12-25 – 2017-12-28 (×3): 1 via ORAL
  Filled 2017-12-24 (×3): qty 1

## 2017-12-24 MED ORDER — METRONIDAZOLE IN NACL 5-0.79 MG/ML-% IV SOLN
500.0000 mg | Freq: Three times a day (TID) | INTRAVENOUS | Status: DC
  Administered 2017-12-24 – 2017-12-28 (×11): 500 mg via INTRAVENOUS
  Filled 2017-12-24 (×11): qty 100

## 2017-12-24 MED ORDER — METRONIDAZOLE IN NACL 5-0.79 MG/ML-% IV SOLN: 500.0000 mg | Freq: Three times a day (TID) | INTRAVENOUS | Status: DC

## 2017-12-24 MED ORDER — COLESTIPOL HCL 1 G PO TABS
1.0000 g | ORAL_TABLET | Freq: Two times a day (BID) | ORAL | Status: DC
  Administered 2017-12-25 – 2017-12-27 (×4): 1 g via ORAL
  Filled 2017-12-24 (×8): qty 1

## 2017-12-24 MED ORDER — DIPHENHYDRAMINE HCL 25 MG PO CAPS
25.0000 mg | ORAL_CAPSULE | Freq: Every evening | ORAL | Status: DC | PRN
  Administered 2017-12-25 – 2017-12-27 (×3): 25 mg via ORAL
  Filled 2017-12-24 (×3): qty 1

## 2017-12-24 MED ORDER — SODIUM CHLORIDE 0.9 % IV SOLN
2.0000 g | INTRAVENOUS | Status: DC
  Administered 2017-12-24 – 2017-12-27 (×4): 2 g via INTRAVENOUS
  Filled 2017-12-24 (×5): qty 20

## 2017-12-24 MED ORDER — SULFASALAZINE 500 MG PO TBEC
1500.0000 mg | DELAYED_RELEASE_TABLET | Freq: Two times a day (BID) | ORAL | Status: DC
  Administered 2017-12-25 – 2017-12-28 (×7): 1500 mg via ORAL
  Filled 2017-12-24 (×8): qty 3

## 2017-12-24 MED ORDER — ACETAMINOPHEN 500 MG PO TABS
500.0000 mg | ORAL_TABLET | Freq: Four times a day (QID) | ORAL | Status: DC | PRN
  Administered 2017-12-25 – 2017-12-27 (×6): 500 mg via ORAL
  Filled 2017-12-24 (×6): qty 1

## 2017-12-24 MED ORDER — RIVAROXABAN 20 MG PO TABS
20.0000 mg | ORAL_TABLET | Freq: Every day | ORAL | Status: DC
  Administered 2017-12-25: 20 mg via ORAL
  Filled 2017-12-24: qty 1

## 2017-12-24 MED ORDER — IOHEXOL 300 MG/ML  SOLN
100.0000 mL | Freq: Once | INTRAMUSCULAR | Status: AC | PRN
  Administered 2017-12-24: 100 mL via INTRAVENOUS

## 2017-12-24 MED ORDER — ONDANSETRON HCL 4 MG PO TABS: 4.0000 mg | ORAL_TABLET | Freq: Four times a day (QID) | ORAL | Status: DC | PRN

## 2017-12-24 MED ORDER — ACETAMINOPHEN 325 MG PO TABS
650.0000 mg | ORAL_TABLET | Freq: Once | ORAL | Status: AC
  Administered 2017-12-24: 650 mg via ORAL
  Filled 2017-12-24: qty 2

## 2017-12-24 MED ORDER — SODIUM CHLORIDE 0.9 % IV SOLN
INTRAVENOUS | Status: DC
  Administered 2017-12-24 – 2017-12-26 (×4): via INTRAVENOUS
  Administered 2017-12-27: 75 mL/h via INTRAVENOUS
  Administered 2017-12-28: 05:00:00 via INTRAVENOUS

## 2017-12-24 MED ORDER — SPIRONOLACTONE 25 MG PO TABS: 25.0000 mg | ORAL_TABLET | Freq: Every day | ORAL | Status: DC

## 2017-12-24 MED ORDER — SODIUM CHLORIDE 0.9 % IV SOLN: 1.0000 g | INTRAVENOUS | Status: DC

## 2017-12-24 MED ORDER — ONDANSETRON HCL 4 MG/2ML IJ SOLN
4.0000 mg | Freq: Four times a day (QID) | INTRAMUSCULAR | Status: DC | PRN
  Administered 2017-12-25 (×2): 4 mg via INTRAVENOUS
  Filled 2017-12-24 (×2): qty 2

## 2017-12-24 NOTE — H&P (Signed)
History and Physical   Cody Suarez XUX:[id number] DOB: Dec 21, 1945 DOA: 12/24/2017  Referring MD/NP/PA: Dr. Melina Copa  PCP: Midge Minium, MD   Outpatient Specialists: Dr. Benay Spice  Patient coming from: Home  Chief Complaint: Weakness  HPI: Cody Suarez is a 72 y.o. 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  Patient has had decreased appetite.  He has not eaten or drank in the last few days.  Mostly water only.  He went to see his primary care physician and complained of the generalized fatigue and lack of appetite.  Also myalgias in his arms and the back of his neck.  He has low-grade headache.  On arrival in the ER patient was found to have a temperature 101, he was tachycardic.  Leukocytosis sees and general features of SIRS.  Further evaluation showed possible intra-abdominal abscess versus cancer related findings.  With this point sepsis is therefore suspected due to intra-abdominal abscess and patient is being admitted for treatment..  ED Course: Initial temperature 101.4 with blood pressure 148/59 pulse 120 respirate of 30 oxygen sat 94% room air.  His white count is 25.2 hemoglobin 11.8 and platelet 493.  Sodium 131 potassium 3.8 chloride 94 CO2 22 with a BUN 12 creatinine 1.29 and calcium 8.7.  Glucose 162 and lactic acid 1.93.  Chest x-ray showed poor inspiration with left basilar atelectasis.  Underlying pneumonia cannot be absolutely excluded.  CT abdomen and chest shows interval development of complex multicystic process in the left upper quadrant of the abdomen.  Suspected fluid collections.  Imaging features likely reflect multifocal abscesses.  Other consideration may be cystic metastatic disease but less likely.  Has had surgery consulted and is being  admitted for work-up.  Review of Systems: As per HPI otherwise 10 point review of systems negative.    Past Medical History:  Diagnosis Date  . Anemia of chronic disease 2015   "related to cancer tx" (08/09/2013)  . Blood clot in vein 2015  . Colon cancer ([place]) 03/2013  . History of kidney stones    x 2passed them both  . Hypertension   . Peripheral vascular disease ([place])   . Ulcerative colitis ([place])    history    Past Surgical History:  Procedure Laterality Date  . ABDOMINAL AORTAGRAM N/A 08/09/2013   Procedure: ABDOMINAL Maxcine Ham;  Surgeon: [name], MD;  Location: [place] CATH LAB;  Service: Cardiovascular;  Laterality: N/A;  . CHOLECYSTECTOMY N/A 09/08/2014   Procedure: LAPAROSCOPIC CHOLECYSTECTOMY WITH INTRAOPERATIVE CHOLANGIOGRAM;  Surgeon: Armandina Gemma, MD;  Location: [facility];  Service: General;  Laterality: N/A;  . COLON SURGERY  02/2013  . COLON SURGERY  10/2015  . FEMORAL-POPLITEAL BYPASS GRAFT Right 08/11/2013   Procedure:   RIGHT - POPLITEAL TO PERONEAL ARTERY BYPASS GRAFT  WITH NONREVERSED SAPHENOUS VEIN GRAFT,tHROMBECTOMY ANTERIOR TIBIALIS,ATTEMPTED THROMBECTOMY TIBIO-PERONEAL TRUNK AND POSTERIOR TIBIALIS, INTRAOPERATIVE ARTERIOGRAM.;  Surgeon: Mal Misty, MD;  Location: [place] [facility];  Service: Vascular;  Laterality: Right;  . FEMORAL-TIBIAL BYPASS GRAFT Right 11/17/2013   Procedure: BYPASS GRAFT RIGHT ABOVE KNEE POPLITEAL TO POSTERIOR TIBIAL ARTERY USING RIGHT NON-REVERSED CEPHALIC VEIN;  Surgeon: Mal Misty, MD;  Location: [place];  Service: Vascular;  Laterality: Right;  . HERNIA REPAIR  ~ 1995; 03/2013   UHR  . INTRAOPERATIVE ARTERIOGRAM Right 11/17/2013   Procedure: INTRA OPERATIVE ARTERIOGRAM;  Surgeon: Mal Misty, MD;  Location: [facility];  Service: Vascular;  Laterality: Right;  . IR GENERIC HISTORICAL  12/11/2015   IR RADIOLOGIST EVAL & MGMT 12/11/2015 Arne Cleveland, MD [facility]  . IR GENERIC HISTORICAL  12/24/2015   IR RADIOLOGIST EVAL & MGMT 12/24/2015  Jacqulynn Cadet, MD [facility] INTER[facility]  . IR GENERIC HISTORICAL  01/21/2016   IR RADIOLOGIST EVAL & MGMT 01/21/2016 [name], MD [facility]  . IR GENERIC HISTORICAL  02/04/2016   IR RADIOLOGIST EVAL & MGMT 02/04/2016 Sandi Mariscal, MD [facility]  . IR GENERIC HISTORICAL  02/12/2016   IR CATHETER TUBE CHANGE 02/12/2016 Corrie Mckusick, DO [facility]  . IR GENERIC HISTORICAL  02/20/2016   IR RADIOLOGIST EVAL & MGMT 02/20/2016 Markus Daft, MD [facility] INTER[facility]  . LAPAROSCOPIC RIGHT HEMI COLECTOMY N/A 03/28/2013   Procedure: LAPAROSCOPIC ASSISTED HEMI COLECTOMY;  Surgeon: Pedro Earls, MD;  [facility];  Service: General;  Laterality: N/A;  . LAPAROSCOPY N/A 11/05/2015   Procedure: LAPAROSCOPY DIAGNOSTIC;  Surgeon: [name], MD;  [facility];  Service: General;  Laterality: N/A;  . LAPAROTOMY N/A 11/05/2015   Procedure: EXPLORATORY LAPAROTOMY, resection of mass at tail of pancreas;  Surgeon: [name], MD;  Location[facility];  Service: General;  Laterality: N/A;  . LOWER EXTREMITY ANGIOGRAM Bilateral 08/09/2013  . TEE WITHOUT CARDIOVERSION N/A 08/10/2013   Procedure: TRANSESOPHAGEAL ECHOCARDIOGRAM (TEE);  Surgeon: Candee Furbish, MD;  Location: [facility];  Service: Cardiovascular;  Laterality: N/A;  . TONSILLECTOMY  ~ 1950  . [place]; 03/2013  . VASECTOMY    . VEIN HARVEST Right 11/17/2013   Procedure: HARVEST OF RIGHT UPPER EXTREMITY CEPHALIC VEIN;  Surgeon: Mal Misty, MD;  Location: [place];  Service: Vascular;  Laterality: Right;     reports that he has never smoked. He has never used smokeless tobacco. He reports that he does not drink alcohol or use drugs.  Allergies  Allergen Reactions  . Amlodipine Swelling    Right LE edema  . Gabapentin Rash    Short-term memory decrease    Family History  Problem Relation Age of Onset  . Heart disease Mother   . Emphysema Father      Prior to Admission medications   Medication Sig Start  Date End Date Taking? Authorizing Provider  acetaminophen (TYLENOL) 500 MG tablet Take 500 mg by mouth every 6 (six) hours as needed.    [provider]  colestipol (COLESTID) 1 g tablet TAKE 1 TABLET BY MOUTH 1 TO 2 TIMES A DAY, NOT WITHIN AN HOUR OF OTHER MEDS 10/25/16   [provider]  doxylamine, Sleep, (UNISOM) 25 MG tablet Take 25 mg by mouth at bedtime as needed.    [provider]  losartan (COZAAR) 100 MG tablet Take 0.5 tablets (50 mg total) by mouth daily. 10/21/17   [name], MD  Multiple Vitamin (MULTIVITAMIN WITH MINERALS) TABS tablet Take 1 tablet by mouth daily.    [provider]  predniSONE (DELTASONE) 10 MG tablet Take 5 mg by mouth daily.  03/29/17   [provider]  rivaroxaban (XARELTO) 20 MG TABS tablet Take 1 tablet (20 mg total) by mouth daily with breakfast. Lot [id number]  06/2019 x2 11/26/17   [name], MD  sildenafil (VIAGRA) 50 MG tablet 1-2 tabs as needed for erectile dysfunction 05/05/17   Midge Minium, MD  spironolactone (ALDACTONE) 25 MG tablet  TAKE 1 TABLET BY MOUTH ONCE DAILY 12/08/17   [name], MD  sulfaSALAzine (AZULFIDINE) 500 MG EC tablet Take 1,500 mg by mouth 2 (two) times daily. 04/23/17   [provider]    Physical Exam: Vitals:   12/24/17 1830 12/24/17 1900 12/24/17 1930 12/24/17 2000  BP: (!) 118/56 121/60 (!) 112/51 125/61  Pulse: 88 85 87 85  Resp: 14 15 18 12   Temp:      SpO2: 96% 98% 97% 95%  Weight:      Height:          Constitutional: NAD, calm, comfortable Vitals:   12/24/17 1830 12/24/17 1900 12/24/17 1930 12/24/17 2000  BP: (!) 118/56 121/60 (!) 112/51 125/61  Pulse: 88 85 87 85  Resp: 14 15 18 12   Temp:      SpO2: 96% 98% 97% 95%  Weight:      Height:       Eyes: PERRL, lids and conjunctivae normal ENMT: Mucous membranes are moist. Posterior pharynx clear of any exudate or lesions.Normal dentition.  Neck: normal, supple, no masses, no  thyromegaly Respiratory: clear to auscultation bilaterally, no wheezing, no crackles. Normal respiratory effort. No accessory muscle use.  Cardiovascular: Regular rate and rhythm, no murmurs / rubs / gallops. No extremity edema. 2+ pedal pulses. No carotid bruits.  Abdomen: LUQ tenderness, no masses palpated. No hepatosplenomegaly. Bowel sounds positive.  Musculoskeletal: no clubbing / cyanosis. No joint deformity upper and lower extremities. Good ROM, no contractures. Normal muscle tone.  Skin: no rashes, lesions, ulcers. No induration Neurologic: CN 2-12 grossly intact. Sensation intact, DTR normal. Strength 5/5 in all 4.  Psychiatric: Normal judgment and insight. Alert and oriented x 3. Normal mood.     Labs on Admission: I have personally reviewed following labs and imaging studies  CBC: Recent Labs  Lab 12/24/17 1642  WBC 25.2*  NEUTROABS 20.4*  HGB 11.8*  HCT 37.2*  MCV 80.2  PLT 038*   Basic Metabolic Panel: Recent Labs  Lab 12/24/17 1642  NA 131*  K 3.8  CL 94*  CO2 22  GLUCOSE 162*  BUN 12  CREATININE 1.29*  CALCIUM 8.7*   GFR: Estimated Creatinine Clearance: 52.7 mL/min (A) (by C-G formula based on SCr of 1.29 mg/dL (H)). Liver Function Tests: Recent Labs  Lab 12/24/17 1642  AST 15  ALT 13  ALKPHOS 75  BILITOT 1.0  PROT 7.8  ALBUMIN 3.0*   No results for input(s): LIPASE, AMYLASE in the last 168 hours. No results for input(s): AMMONIA in the last 168 hours. Coagulation Profile: Recent Labs  Lab 12/24/17 1642  INR 3.45   Cardiac Enzymes: No results for input(s): CKTOTAL, CKMB, CKMBINDEX, TROPONINI in the last 168 hours. BNP (last 3 results) No results for input(s): PROBNP in the last 8760 hours. HbA1C: No results for input(s): HGBA1C in the last 72 hours. CBG: No results for input(s): GLUCAP in the last 168 hours. Lipid Profile: No results for input(s): CHOL, HDL, LDLCALC, TRIG, CHOLHDL, LDLDIRECT in the last 72 hours. Thyroid Function  Tests: No results for input(s): TSH, T4TOTAL, FREET4, T3FREE, THYROIDAB in the last 72 hours. Anemia Panel: No results for input(s): VITAMINB12, FOLATE, FERRITIN, TIBC, IRON, RETICCTPCT in the last 72 hours. Urine analysis:    Component Value Date/Time   COLORURINE YELLOW 12/24/2017 1950   APPEARANCEUR CLEAR 12/24/2017 1950   LABSPEC 1.012 12/24/2017 1950   PHURINE 6.0 12/24/2017 1950   GLUCOSEU NEGATIVE 12/24/2017 1950   [place] NEGATIVE 12/24/2017  [place] 12/24/2017 1950   KETONESUR 20 (A) 12/24/2017 1950   PROTEINUR NEGATIVE 12/24/2017 1950   UROBILINOGEN 0.2 09/09/2014 1638   NITRITE NEGATIVE 12/24/2017 1950   LEUKOCYTESUR NEGATIVE 12/24/2017 1950   Sepsis Labs: @LABRCNTIP (procalcitonin:4,lacticidven:4) )No results found for this or any previous visit (from the past 240 hour(s)).   Radiological Exams on Admission: Ct Chest W Contrast  Result Date: 12/24/2017 CLINICAL DATA:  Abdominal pain and fever. Abscess is suspected. Lethargy and anorexia. Recent diagnosis of colon cancer. EXAM: CT CHEST, ABDOMEN, AND PELVIS WITH CONTRAST TECHNIQUE: Multidetector CT imaging of the chest, abdomen and pelvis was performed following the standard protocol during bolus administration of intravenous contrast. CONTRAST:  113m OMNIPAQUE IOHEXOL 300 MG/ML  SOLN COMPARISON:  Abdomen and pelvis CT 09/29/2017.  Chest CT 06/28/2017. FINDINGS: CT CHEST FINDINGS Cardiovascular: The heart size is normal. No substantial pericardial effusion. Coronary artery calcification is evident. Atherosclerotic calcification is noted in the wall of the thoracic aorta. Mediastinum/Nodes: No mediastinal lymphadenopathy. There is no hilar lymphadenopathy. The esophagus has normal imaging features. There is no axillary lymphadenopathy. Lungs/Pleura: The central tracheobronchial airways are patent. 7 mm right middle lobe nodule is stable in the interval. Dependent atelectasis noted in the lower lobes bilaterally.  No edema or pleural effusion. Musculoskeletal: No worrisome lytic or sclerotic osseous abnormality. CT ABDOMEN PELVIS FINDINGS Hepatobiliary: No focal abnormality within the liver parenchyma. Gallbladder surgically absent. No intrahepatic or extrahepatic biliary dilation. Pancreas: Pancreas is atrophic. Surgical clips are seen in the region of the pancreatic tail. No dilatation of the main pancreatic duct. Spleen: Abnormal soft tissue again noted in the splenic hilum, substantially progressed since 09/29/2017. There is altered perfusion to the spleen, potentially related to sites of infiltrative disease or infarct. Adrenals/Urinary Tract: No adrenal nodule or mass. 7 mm nonobstructing stone identified lower pole right kidney. Left kidney unremarkable. No evidence for hydroureter. The urinary bladder appears normal for the degree of distention. Stomach/Bowel: Stomach is markedly distorted due to a heterogeneous complex left upper lobe process (see below). Duodenum is normally positioned as is the ligament of Treitz. Duodenal diverticuli evident. No small bowel wall thickening. No small bowel dilatation. The terminal ileum is normal. The appendix is normal. Anastomotic suture line noted in the transverse colon with associated circumferential wall thickening. The transverse colon in the region the anastomosis is incorporated into the complex left upper quadrant process (see below). Vascular/Lymphatic: There is abdominal aortic atherosclerosis without aneurysm. There is no gastrohepatic or hepatoduodenal ligament lymphadenopathy. No intraperitoneal or retroperitoneal lymphadenopathy. No pelvic sidewall lymphadenopathy. Reproductive: The prostate gland and seminal vesicles have normal imaging features. Other: Complex process in the left upper quadrant involves the stomach, spleen, tail of pancreas, and distal transverse colon in the region of the anastomosis. On the previous study 3 months ago, there was only a 4.7 x 4.7  cm irregular soft tissue mass tethered between the stomach and the splenic hilum. This did track inferiorly and medially towards the tail of pancreas. On today's exam, there is a multi loculated, multicystic process in the left upper quadrant. This includes 2 separate rim enhancing collections in or on the wall of the greater curvature. 1 of these measures 3.7 x 4.7 cm (52/3) and the other more proximal lesion measures 2.4 x 3.6 cm. This latter collection appears to track into an irregular rim enhancing 4.4 x 3.3 cm collection dissecting into the splenic hilum. There is edema and inflammation in this region with fluid tracking throughout the left  upper quadrant and around the spleen. Again this process tethers the proximal stomach, transverse colon, spleen, and pancreatic tail. Musculoskeletal: No worrisome lytic or sclerotic osseous abnormality. IMPRESSION: Interval development of a complex multicystic process in the left upper quadrant of the abdomen. There are multiple rim enhancing complex fluid collections ranging in size from about 2 mm up to 5 mm. Two of these collections are along the greater curvature of the stomach and the more distal of the two may be intramural. The process dissects/insinuates into the splenic hilum and probably infiltrates the splenic parenchyma although compromise of the splenic vascular anatomy in the hilum with subsequent splenic infarct would also be a consideration. The distal transverse colon at the level of the anastomosis is retracted into this process. Imaging features likely reflect multifocal abscess. Compromise of the tethered colon as etiology for these changes would be a consideration. Given the appearance today, cystic metastatic disease is considered less likely given the marked interval change, although this possibility cannot be excluded. Electronically Signed   By: Misty Stanley M.D.   On: 12/24/2017 20:10   Ct Abdomen Pelvis W Contrast  Result Date:  12/24/2017 CLINICAL DATA:  Abdominal pain and fever. Abscess is suspected. Lethargy and anorexia. Recent diagnosis of colon cancer. EXAM: CT CHEST, ABDOMEN, AND PELVIS WITH CONTRAST TECHNIQUE: Multidetector CT imaging of the chest, abdomen and pelvis was performed following the standard protocol during bolus administration of intravenous contrast. CONTRAST:  163m OMNIPAQUE IOHEXOL 300 MG/ML  SOLN COMPARISON:  Abdomen and pelvis CT 09/29/2017.  Chest CT 06/28/2017. FINDINGS: CT CHEST FINDINGS Cardiovascular: The heart size is normal. No substantial pericardial effusion. Coronary artery calcification is evident. Atherosclerotic calcification is noted in the wall of the thoracic aorta. Mediastinum/Nodes: No mediastinal lymphadenopathy. There is no hilar lymphadenopathy. The esophagus has normal imaging features. There is no axillary lymphadenopathy. Lungs/Pleura: The central tracheobronchial airways are patent. 7 mm right middle lobe nodule is stable in the interval. Dependent atelectasis noted in the lower lobes bilaterally. No edema or pleural effusion. Musculoskeletal: No worrisome lytic or sclerotic osseous abnormality. CT ABDOMEN PELVIS FINDINGS Hepatobiliary: No focal abnormality within the liver parenchyma. Gallbladder surgically absent. No intrahepatic or extrahepatic biliary dilation. Pancreas: Pancreas is atrophic. Surgical clips are seen in the region of the pancreatic tail. No dilatation of the main pancreatic duct. Spleen: Abnormal soft tissue again noted in the splenic hilum, substantially progressed since 09/29/2017. There is altered perfusion to the spleen, potentially related to sites of infiltrative disease or infarct. Adrenals/Urinary Tract: No adrenal nodule or mass. 7 mm nonobstructing stone identified lower pole right kidney. Left kidney unremarkable. No evidence for hydroureter. The urinary bladder appears normal for the degree of distention. Stomach/Bowel: Stomach is markedly distorted due to  a heterogeneous complex left upper lobe process (see below). Duodenum is normally positioned as is the ligament of Treitz. Duodenal diverticuli evident. No small bowel wall thickening. No small bowel dilatation. The terminal ileum is normal. The appendix is normal. Anastomotic suture line noted in the transverse colon with associated circumferential wall thickening. The transverse colon in the region the anastomosis is incorporated into the complex left upper quadrant process (see below). Vascular/Lymphatic: There is abdominal aortic atherosclerosis without aneurysm. There is no gastrohepatic or hepatoduodenal ligament lymphadenopathy. No intraperitoneal or retroperitoneal lymphadenopathy. No pelvic sidewall lymphadenopathy. Reproductive: The prostate gland and seminal vesicles have normal imaging features. Other: Complex process in the left upper quadrant involves the stomach, spleen, tail of pancreas, and distal transverse colon in the region  of the anastomosis. On the previous study 3 months ago, there was only a 4.7 x 4.7 cm irregular soft tissue mass tethered between the stomach and the splenic hilum. This did track inferiorly and medially towards the tail of pancreas. On today's exam, there is a multi loculated, multicystic process in the left upper quadrant. This includes 2 separate rim enhancing collections in or on the wall of the greater curvature. 1 of these measures 3.7 x 4.7 cm (52/3) and the other more proximal lesion measures 2.4 x 3.6 cm. This latter collection appears to track into an irregular rim enhancing 4.4 x 3.3 cm collection dissecting into the splenic hilum. There is edema and inflammation in this region with fluid tracking throughout the left upper quadrant and around the spleen. Again this process tethers the proximal stomach, transverse colon, spleen, and pancreatic tail. Musculoskeletal: No worrisome lytic or sclerotic osseous abnormality. IMPRESSION: Interval development of a complex  multicystic process in the left upper quadrant of the abdomen. There are multiple rim enhancing complex fluid collections ranging in size from about 2 mm up to 5 mm. Two of these collections are along the greater curvature of the stomach and the more distal of the two may be intramural. The process dissects/insinuates into the splenic hilum and probably infiltrates the splenic parenchyma although compromise of the splenic vascular anatomy in the hilum with subsequent splenic infarct would also be a consideration. The distal transverse colon at the level of the anastomosis is retracted into this process. Imaging features likely reflect multifocal abscess. Compromise of the tethered colon as etiology for these changes would be a consideration. Given the appearance today, cystic metastatic disease is considered less likely given the marked interval change, although this possibility cannot be excluded. Electronically Signed   By: Misty Stanley M.D.   On: 12/24/2017 20:10   Dg Chest Port 1 View  Result Date: 12/24/2017 CLINICAL DATA:  Fever, malaise and weakness. EXAM: PORTABLE CHEST 1 VIEW COMPARISON:  Chest CT dated 06/28/2017. FINDINGS: Poor inspiration. Borderline enlarged heart. Tortuous aorta. Mild left basilar predominantly linear density. Minimal diffuse peribronchial thickening and accentuation of the interstitial markings. Thoracic spine degenerative changes. IMPRESSION: 1. Poor inspiration with mild left basilar atelectasis. Underlying pneumonia could not be absolutely excluded. 2. Minimal bronchitic changes. Electronically Signed   By: Claudie Revering M.D.   On: 12/24/2017 17:17    Assessment/Plan Principal Problem:   Sepsis, Gram negative (HCC) Active Problems:   Ulcerative colitis ([place])   Anemia of chronic disease   PAD (peripheral artery disease) ([place])   Metastatic colon adenocarcinoma to pancreas s/p distal pancreatectomy 11/05/2015   Intra-abdominal abscess (HCC)     #1 sepsis: Secondary  to possible pneumonia versus intra-abdominal abscess.  We will initiate IV antibiotics.  Blood cultures.  Surgical consultation.  Patient will may be evaluated for possible percutaneous drainage however the abscess appears multifocal.  Oncology will likely be consulted in the morning also to be involved in decision making.  #2 metastatic adenocarcinoma of the colon to pancreas: Patient has had distal pancreatectomy.  Is being followed by oncology.  #3 possible pneumonia: Patient will be on Rocephin and Diflucan.  Follow culture results.  #4 anorexia: Probably related to malignancy.  May consider starting Megace.  #5 anemia of chronic disease: continue monitoring H&H.  #6 peripheral vascular disease: Continue monitoring.  Patient has been on anticoagulation.  May hold it if procedures is contemplated  #7 ulcerative colitis: Patient on sulfasalazine.  DVT prophylaxis: Xarelto Code  Status: Full code Family Communication: Wife and children at bedside with patient Disposition Plan: To be determined Consults called: Dr. Ninfa Linden of surgery Admission status: Inpatient  Severity of Illness: The appropriate patient status for this patient is INPATIENT. Inpatient status is judged to be reasonable and necessary in order to provide the required intensity of service to ensure the patient's safety. The patient's presenting symptoms, physical exam findings, and initial radiographic and laboratory data in the context of their chronic comorbidities is felt to place them at high risk for further clinical deterioration. Furthermore, it is not anticipated that the patient will be medically stable for discharge from the hospital within 2 midnights of admission. The following factors support the patient status of inpatient.   " The patient's presenting symptoms include abdominal pain and weakness. " The worrisome physical exam findings include tenderness in the left upper quadrant. " The initial radiographic and  laboratory data are worrisome because of CT scan findings as described. " The chronic co-morbidities include metastatic colon cancer.   * I certify that at the point of admission it is my clinical judgment that the patient will require inpatient hospital care spanning beyond 2 midnights from the point of admission due to high intensity of service, high risk for further deterioration and high frequency of surveillance required.Barbette Merino MD [facility] Pager [phone]  If 7PM-7AM, please contact night-coverage [email or web address] Password [facility]  12/24/2017, 8:53 PM

## 2017-12-24 NOTE — ED Notes (Signed)
Patient transported to CT 

## 2017-12-24 NOTE — ED Notes (Signed)
Pt unable to give urine at this time.

## 2017-12-24 NOTE — Progress Notes (Signed)
Patient presents to clinic today c/o 5 days of worsening chills, anorexia, constipation, fatigue and weakness. Now noting some SOB. Denies fever, nausea/vomiting, abdominal pain. Notes feeling very dry mouthed despite increased hydration. Denies change to bowel/bladder habits. Recently found out he has a recurrence of his metastatic colon cancer.   Past Medical History:  Diagnosis Date  . Anemia of chronic disease 2015   "related to cancer tx" (08/09/2013)  . Blood clot in vein 2015  . Colon cancer ([place]) 03/2013  . History of kidney stones    x 2passed them both  . Hypertension   . Peripheral vascular disease ([place])   . Ulcerative colitis ([place])    history    Current Outpatient Medications on File Prior to Visit  Medication Sig Dispense Refill  . acetaminophen (TYLENOL) 500 MG tablet Take 500 mg by mouth every 6 (six) hours as needed.    . colestipol (COLESTID) 1 g tablet TAKE 1 TABLET BY MOUTH 1 TO 2 TIMES A DAY, NOT WITHIN AN HOUR OF OTHER MEDS  3  . doxylamine, Sleep, (UNISOM) 25 MG tablet Take 25 mg by mouth at bedtime as needed.    Marland Kitchen losartan (COZAAR) 100 MG tablet Take 0.5 tablets (50 mg total) by mouth daily. 45 tablet 2  . Multiple Vitamin (MULTIVITAMIN WITH MINERALS) TABS tablet Take 1 tablet by mouth daily.    . predniSONE (DELTASONE) 10 MG tablet Take 5 mg by mouth daily.   1  . rivaroxaban (XARELTO) 20 MG TABS tablet Take 1 tablet (20 mg total) by mouth daily with breakfast. Lot [id number]  06/2019 x2 28 tablet 0  . sildenafil (VIAGRA) 50 MG tablet 1-2 tabs as needed for erectile dysfunction 30 tablet 3  . spironolactone (ALDACTONE) 25 MG tablet TAKE 1 TABLET BY MOUTH ONCE DAILY 30 tablet 1  . sulfaSALAzine (AZULFIDINE) 500 MG EC tablet Take 1,500 mg by mouth 2 (two) times daily.  3   No current facility-administered medications on file prior to visit.     Allergies  Allergen Reactions  . Amlodipine Swelling    Right LE edema  . Gabapentin Rash    Short-term memory  decrease    Family History  Problem Relation Age of Onset  . Heart disease Mother   . Emphysema Father     Social History   Socioeconomic History  . Marital status: Married    Spouse name: Not on file  . Number of children: Not on file  . Years of education: Not on file  . Highest education level: Not on file  Occupational History  . Not on file  Social Needs  . Financial resource strain: Not on file  . Food insecurity:    Worry: Not on file    Inability: Not on file  . Transportation needs:    Medical: Not on file    Non-medical: Not on file  Tobacco Use  . Smoking status: Never Smoker  . Smokeless tobacco: Never Used  . Tobacco comment: states his father died of emphysema from smoking, he did not want to be like that  Substance and Sexual Activity  . Alcohol use: No  . Drug use: No  . Sexual activity: Yes  Lifestyle  . Physical activity:    Days per week: Not on file    Minutes per session: Not on file  . Stress: Not on file  Relationships  . Social connections:    Talks on phone: Not on file    Gets  together: Not on file    Attends religious service: Not on file    Active member of club or organization: Not on file    Attends meetings of clubs or organizations: Not on file    Relationship status: Not on file  Other Topics Concern  . Not on file  Social History Narrative  . Not on file   Review of Systems - See HPI.  All other ROS are negative.  BP 110/60   Pulse (!) 120   Temp 98.3 F (36.8 C) (Oral)   Resp 18   Ht 5' 10"  (1.778 m)   Wt 170 lb (77.1 kg)   SpO2 94%   BMI 24.39 kg/m   Physical Exam Constitutional:      Appearance: Normal appearance.  HENT:     Head: Normocephalic and atraumatic.     Nose: Nose normal.     Mouth/Throat:     Mouth: Mucous membranes are dry.  Eyes:     Conjunctiva/sclera: Conjunctivae normal.  Cardiovascular:     Rate and Rhythm: Regular rhythm. Tachycardia present.     Pulses: Normal pulses.     Heart  sounds: Normal heart sounds.  Pulmonary:     Breath sounds: No wheezing, rhonchi or rales.     Comments: Mildly tachypnea at rest. Moderate tachypnea noted with ambulation  Chest:     Chest wall: No tenderness.  Abdominal:     General: Bowel sounds are normal.     Palpations: Abdomen is soft.  Neurological:     Mental Status: He is alert and oriented to person, place, and time. Mental status is at baseline.     Cranial Nerves: No cranial nerve deficit.    Recent Results (from the past 2160 hour(s))  CBC with Differential/Platelet     Status: Abnormal   Collection Time: 10/19/17  7:20 AM  Result Value Ref Range   WBC 15.9 (H) 4.0 - 10.5 K/uL   RBC 5.14 4.22 - 5.81 MIL/uL   Hemoglobin 12.7 (L) 13.0 - 17.0 g/dL   HCT 42.2 39.0 - 52.0 %   MCV 82.1 80.0 - 100.0 fL   MCH 24.7 (L) 26.0 - 34.0 pg   MCHC 30.1 30.0 - 36.0 g/dL   RDW 17.4 (H) 11.5 - 15.5 %   Platelets 454 (H) 150 - 400 K/uL   nRBC 0.0 0.0 - 0.2 %   Neutrophils Relative % 77 %   Neutro Abs 12.4 (H) 1.7 - 7.7 K/uL   Lymphocytes Relative 10 %   Lymphs Abs 1.6 0.7 - 4.0 K/uL   Monocytes Relative 9 %   Monocytes Absolute 1.4 (H) 0.1 - 1.0 K/uL   Eosinophils Relative 2 %   Eosinophils Absolute 0.3 0.0 - 0.5 K/uL   Basophils Relative 1 %   Basophils Absolute 0.2 (H) 0.0 - 0.1 K/uL   Immature Granulocytes 1 %   Abs Immature Granulocytes 0.13 (H) 0.00 - 0.07 K/uL    Comment: Performed at [facility], [place] [address], [place], [place] [address]  Comprehensive metabolic panel     Status: Abnormal   Collection Time: 10/19/17  7:20 AM  Result Value Ref Range   Sodium 141 135 - 145 mmol/L   Potassium 3.9 3.5 - 5.1 mmol/L   Chloride 104 98 - 111 mmol/L   CO2 23 22 - 32 mmol/L   Glucose, Bld 158 (H) 70 - 99 mg/dL   BUN 16 8 - 23 mg/dL   Creatinine, Ser  1.20 0.61 - 1.24 mg/dL   Calcium 9.6 8.9 - 10.3 mg/dL   Total Protein 8.6 (H) 6.5 - 8.1 g/dL   Albumin 4.2 3.5 - 5.0 g/dL   AST 26 15 - 41 U/L   ALT 16 0  - 44 U/L   Alkaline Phosphatase 60 38 - 126 U/L   Total Bilirubin 0.6 0.3 - 1.2 mg/dL   GFR calc non Af Amer 59 (L) >60 mL/min   GFR calc Af Amer >60 >60 mL/min    Comment: (NOTE) The eGFR has been calculated using the CKD EPI equation. This calculation has not been validated in all clinical situations. eGFR's persistently <60 mL/min signify possible Chronic Kidney Disease.    Anion gap 14 5 - 15    Comment: Performed at [facility], [place] [address] [place], [place] [address]  Protime-INR     Status: None   Collection Time: 10/19/17  7:20 AM  Result Value Ref Range   Prothrombin Time 13.9 11.4 - 15.2 seconds   INR 1.08     Comment: Performed at [facility], [place] [address] [place], [place] [address]  PSA, Medicare     Status: Abnormal   Collection Time: 11/12/17  1:53 PM  Result Value Ref Range   PSA 6.79 (H) 0.10 - 4.00 ng/ml    Comment: Test performed using Access Hybritech PSA Assay, a parmagnetic partical, chemiluminecent immunoassay.  Lipid panel     Status: Abnormal   Collection Time: 11/12/17  1:53 PM  Result Value Ref Range   Cholesterol 137 0 - 200 mg/dL    Comment: ATP III Classification       Desirable:  < 200 mg/dL               Borderline High:  200 - 239 mg/dL          High:  > = 240 mg/dL   Triglycerides 219.0 (H) 0.0 - 149.0 mg/dL    Comment: Normal:  <150 mg/dLBorderline High:  150 - 199 mg/dL   HDL 29.20 (L) >39.00 mg/dL   VLDL 43.8 (H) 0.0 - 40.0 mg/dL   Total CHOL/HDL Ratio 5     Comment:                Men          Women1/2 Average Risk     3.4          3.3Average Risk          5.0          4.42X Average Risk          9.6          7.13X Average Risk          15.0          11.0                       NonHDL 107.63     Comment: NOTE:  Non-HDL goal should be 30 mg/dL higher than patient's LDL goal (i.e. LDL goal of < 70 mg/dL, would have non-HDL goal of < 100 mg/dL)  TSH     Status: None   Collection Time: 11/12/17  1:53  PM  Result Value Ref Range   TSH 1.82 0.35 - 4.50 uIU/mL  LDL cholesterol, direct     Status: None   Collection Time: 11/12/17  1:53 PM  Result Value Ref Range   Direct  LDL 91.0 mg/dL    Comment: Optimal:  <100 mg/dLNear or Above Optimal:  100-129 mg/dLBorderline High:  130-159 mg/dLHigh:  160-189 mg/dLVery High:  >190 mg/dL    Assessment/Plan: 1. Tachycardia 2. Tachypnea 3. Hypoxia 4. Dehydration 5 days of anorexia with dry mouth, fatigue, chills and decreased fluid intake in patient with history of HTN, PAD, Metastatic colon cancer with recurrence (no current treatment). O2 90-97 RA at rest, sometimes dipping to 87. With exertion O2 decreases to upper 70s, low 80s. BP stable. Lungs CTAB. No abdominal tenderness on exam. EMS called and patient sent to [facility] for further assessment and management.     Leeanne Rio, PA-C

## 2017-12-24 NOTE — ED Provider Notes (Signed)
[facility] EMERGENCY DEPARTMENT Provider Note   CSN: [id number] Arrival date & time: 12/24/17  1630     History   Chief Complaint Chief Complaint  Patient presents with  . Weakness    HPI Cody Suarez is a 72 y.o. male.  Patient arrives by EMS from the PCPs office.  He has been not feeling well for a week with generalized fatigue and lack of appetite.  He still says he has been very thirsty and has been drinking a lot.  He is complaining of some myalgias in his arms and the back of his neck and a little bit of a low-grade headache.  He rates the pain as a 0 right now.  He said he had a little bit of discomfort in his left lower quadrant that is gone away.  Normal bowel movement this morning.  No cough.  He is felt hot and cold but did not check his temperature.  No urinary symptoms.  No sick contacts or recent travel.  The history is provided by the patient and the EMS personnel.  Weakness  Primary symptoms include no focal weakness. This is a new problem. The current episode started more than 1 week ago. The problem has been gradually worsening. There was no focality noted. The maximum temperature recorded prior to his arrival was 100 to 100.9 F. Associated symptoms include headaches. Pertinent negatives include no shortness of breath, no chest pain, no vomiting, no altered mental status and no confusion. There were no medications administered prior to arrival. Associated medical issues do not include trauma.    Past Medical History:  Diagnosis Date  . Anemia of chronic disease 2015   "related to cancer tx" (08/09/2013)  . Blood clot in vein 2015  . Colon cancer ([place]) 03/2013  . History of kidney stones    x 2passed them both  . Hypertension   . [place]ipheral [place]   . Ulcerative colitis [facility])    history    Patient Active Problem List   Diagnosis Date Noted  . Hyperlipidemia 11/12/2017  . Erectile dysfunction 05/05/2017  . 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 History of kidney stones   . PAD (peripheral artery disease) ([place]) 01/22/2015  . Anemia of chronic disease 09/06/2014  . 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 Atherosclerotic PVD with ulceration ([place]) 11/14/2013  . Atherosclerotic PVD with intermittent claudication ([place]) 10/31/2013  . 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 Hypertension   . Arterial thromboembolism ([place]) 08/09/2013  . 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 Right inguinal hernia 03/23/2013  . Umbilical hernia 38/25/0539  . Ulcerative colitis ([place]) 03/23/2013    Past Surgical History:  Procedure Laterality Date  . ABDOMINAL AORTAGRAM N/A 08/09/2013   Procedure: ABDOMINAL Maxcine Ham;  Surgeon: [name], MD;  Location: [facility] CATH LAB;  Service: Cardiovascular;  Laterality: N/A;  . CHOLECYSTECTOMY N/A 09/08/2014   Procedure: LAPAROSCOPIC CHOLECYSTECTOMY WITH INTRAOPERATIVE CHOLANGIOGRAM;  Surgeon: Armandina Gemma, MD;  Location: [facility] OR[facility];  Service: General;  Laterality: N/A;  . COLON SURGERY  02/2013  . COLON SURGERY  10/2015  . FEMORAL-POPLITEAL BYPASS GRAFT Right 08/11/2013   Procedure:   RIGHT - POPLITEAL TO PERONEAL ARTERY BYPASS GRAFT  WITH NONREVERSED SAPHENOUS VEIN GRAFT,tHROMBECTOMY ANTERIOR TIBIALIS,ATTEMPTED THROMBECTOMY TIBIO-PERONEAL TRUNK AND POSTERIOR TIBIALIS, INTRAOPERATIVE ARTERIOGRAM.;  Surgeon: Mal Misty, MD;  Location: [place];  Service: Vascular;  Laterality: Right;  . FEMORAL-TIBIAL  BYPASS GRAFT Right 11/17/2013   Procedure: BYPASS GRAFT RIGHT ABOVE KNEE POPLITEAL TO POSTERIOR TIBIAL ARTERY USING RIGHT NON-REVERSED CEPHALIC VEIN;  Surgeon: Mal Misty, MD;  Location: [facility];  Service: Vascular;  Laterality: Right;  . HERNIA REPAIR  ~ 1995; 03/2013   UHR  . INTRAOPERATIVE ARTERIOGRAM Right 11/17/2013   Procedure: INTRA OPERATIVE ARTERIOGRAM;  Surgeon: Mal Misty, MD;  Location: [facility] OR;  Service:  Vascular;  Laterality: Right;  . IR GENERIC HISTORICAL  12/11/2015   IR RADIOLOGIST EVAL & MGMT 12/11/2015 Arne Cleveland, MD [facility]  . IR GENERIC HISTORICAL  12/24/2015   IR RADIOLOGIST EVAL & MGMT 12/24/2015 Jacqulynn Cadet, MD [facility]  . IR GENERIC HISTORICAL  01/21/2016   IR RADIOLOGIST EVAL & MGMT 01/21/2016 [name], MD [facility]  . IR GENERIC HISTORICAL  02/04/2016   IR RADIOLOGIST EVAL & MGMT 02/04/2016 Sandi Mariscal, MD [facility]  . IR GENERIC HISTORICAL  02/12/2016   IR CATHETER TUBE CHANGE 02/12/2016 Corrie Mckusick, DO [facility]  . IR GENERIC HISTORICAL  02/20/2016   IR RADIOLOGIST EVAL & MGMT 02/20/2016 Markus Daft, MD [facility] INTER[facility]  . LAPAROSCOPIC RIGHT HEMI COLECTOMY N/A 03/28/2013   Procedure: LAPAROSCOPIC ASSISTED HEMI COLECTOMY;  Surgeon: Pedro Earls, MD;  Location[facility];  Service: General;  Laterality: N/A;  . LAPAROSCOPY N/A 11/05/2015   Procedure: LAPAROSCOPY DIAGNOSTIC;  Surgeon: [name], MD;  Location: [facility];  Service: General;  Laterality: N/A;  . LAPAROTOMY N/A 11/05/2015   Procedure: EXPLORATORY LAPAROTOMY, resection of mass at tail of pancreas;  Surgeon: [name], MD;  Location[facility];  Service: General;  Laterality: N/A;  . LOWER EXTREMITY ANGIOGRAM Bilateral 08/09/2013  . TEE WITHOUT CARDIOVERSION N/A 08/10/2013   Procedure: TRANSESOPHAGEAL ECHOCARDIOGRAM (TEE);  Surgeon: Candee Furbish, MD;  Location[facility] ENDOSCOPY;  Service: Cardiovascular;  Laterality: N/A;  . TONSILLECTOMY  ~ 1950  . [place]; 03/2013  . VASECTOMY    . VEIN HARVEST Right 11/17/2013   Procedure: HARVEST OF RIGHT UPPER EXTREMITY CEPHALIC VEIN;  Surgeon: Mal Misty, MD;  Location: [place];  Service: Vascular;  Laterality: Right;        Home Medications    Prior to Admission medications   Medication Sig Start Date End Date Taking? Authorizing Provider  acetaminophen (TYLENOL) 500 MG tablet Take 500 mg by mouth every 6  (six) hours as needed.    [provider]  colestipol (COLESTID) 1 g tablet TAKE 1 TABLET BY MOUTH 1 TO 2 TIMES A DAY, NOT WITHIN AN HOUR OF OTHER MEDS 10/25/16   [provider]  doxylamine, Sleep, (UNISOM) 25 MG tablet Take 25 mg by mouth at bedtime as needed.    [provider]  losartan (COZAAR) 100 MG tablet Take 0.5 tablets (50 mg total) by mouth daily. 10/21/17   [name], MD  Multiple Vitamin (MULTIVITAMIN WITH MINERALS) TABS tablet Take 1 tablet by mouth daily.    [provider]  predniSONE (DELTASONE) 10 MG tablet Take 5 mg by mouth daily.  03/29/17   [provider]  rivaroxaban (XARELTO) 20 MG TABS tablet Take 1 tablet (20 mg total) by mouth daily with breakfast. Lot [id number]  06/2019 x2 11/26/17   [name], MD  sildenafil (VIAGRA) 50 MG tablet 1-2 tabs as needed for erectile dysfunction 05/05/17   Midge Minium, MD  spironolactone (ALDACTONE) 25 MG tablet TAKE 1 TABLET BY  MOUTH ONCE DAILY 12/08/17   [name], MD  sulfaSALAzine (AZULFIDINE) 500 MG EC tablet Take 1,500 mg by mouth 2 (two) times daily. 04/23/17   [provider]    Family History Family History  Problem Relation Age of Onset  . Heart disease Mother   . Emphysema Father     Social History Social History   Tobacco Use  . Smoking status: Never Smoker  . Smokeless tobacco: Never Used  . Tobacco comment: states his father died of emphysema from smoking, he did not want to be like that  Substance Use Topics  . Alcohol use: No  . Drug use: No     Allergies   Amlodipine and Gabapentin   Review of Systems Review of Systems  Constitutional: Positive for appetite change, chills, fatigue and fever.  HENT: Negative for ear pain and sore throat.   Eyes: Negative for pain and visual disturbance.  Respiratory: Negative for cough and shortness of breath.   Cardiovascular: Negative for chest pain and palpitations.  Gastrointestinal:  Positive for abdominal pain. Negative for diarrhea, nausea and vomiting.  Genitourinary: Negative for dysuria and hematuria.  Musculoskeletal: Positive for neck pain. Negative for arthralgias, back pain and neck stiffness.  Skin: Negative for color change and rash.  Neurological: Positive for weakness and headaches. Negative for focal weakness, seizures and syncope.  Psychiatric/Behavioral: Negative for confusion.  All other systems reviewed and are negative.    Physical Exam Updated Vital Signs BP 131/65   Pulse (!) 109   Temp 100.3 F (37.9 C)   Resp 18   Ht 5' 10"  (1.778 m)   Wt 77.1 kg   SpO2 97%   BMI 24.39 kg/m   Physical Exam Vitals signs and nursing note reviewed.  Constitutional:      Appearance: He is well-developed. He is not toxic-appearing.  HENT:     Head: Normocephalic and atraumatic.     Mouth/Throat:     Mouth: Mucous membranes are moist.  Eyes:     Conjunctiva/sclera: Conjunctivae normal.  Neck:     Musculoskeletal: Normal range of motion and neck supple. No neck rigidity.  Cardiovascular:     Rate and Rhythm: Regular rhythm. Tachycardia present.     Heart sounds: No murmur.  Pulmonary:     Effort: Pulmonary effort is normal. No respiratory distress.     Breath sounds: Normal breath sounds.  Abdominal:     Palpations: Abdomen is soft.     Tenderness: There is no abdominal tenderness. There is no guarding or rebound.  Musculoskeletal: Normal range of motion.        General: No tenderness, deformity or signs of injury.  Skin:    General: Skin is warm and dry.     Capillary Refill: Capillary refill takes less than 2 seconds.  Neurological:     General: No focal deficit present.     Mental Status: He is alert.      ED Treatments / Results  Labs (all labs ordered are listed, but only abnormal results are displayed) Labs Reviewed  COMPREHENSIVE METABOLIC PANEL - Abnormal; Notable for the following components:      Result Value   Sodium 131 (*)     Chloride 94 (*)    Glucose, Bld 162 (*)    Creatinine, Ser 1.29 (*)    Calcium 8.7 (*)    Albumin 3.0 (*)    GFR calc non Af Amer 55 (*)    All other components within  normal limits  CBC WITH DIFFERENTIAL/PLATELET - Abnormal; Notable for the following components:   WBC 25.2 (*)    Hemoglobin 11.8 (*)    HCT 37.2 (*)    MCH 25.4 (*)    RDW 18.8 (*)    Platelets 493 (*)    Neutro Abs 20.4 (*)    Monocytes Absolute 2.8 (*)    Basophils Absolute 0.5 (*)    All other components within normal limits  URINALYSIS, ROUTINE W REFLEX MICROSCOPIC - Abnormal; Notable for the following components:   Ketones, ur 20 (*)    All other components within normal limits  PROTIME-INR - Abnormal; Notable for the following components:   Prothrombin Time 34.2 (*)    All other components within normal limits  I-STAT CG4 LACTIC ACID, ED - Abnormal; Notable for the following components:   Lactic Acid, Venous 1.93 (*)    All other components within normal limits  CULTURE, BLOOD (ROUTINE X 2)  CULTURE, BLOOD (ROUTINE X 2)  INFLUENZA PANEL BY PCR (TYPE A & B)  COMPREHENSIVE METABOLIC PANEL  CBC  I-STAT TROPONIN, ED  I-STAT CG4 LACTIC ACID, ED    EKG EKG Interpretation  Date/Time:  Friday December 24 2017 17:00:17 EST Ventricular Rate:  104 PR Interval:    QRS Duration: 77 QT Interval:  344 QTC Calculation: 453 R Axis:   -59 Text Interpretation:  Sinus tachycardia Left anterior fascicular block Low voltage, precordial leads Consider anterior infarct increased rate otherwise similar to prior 11/17 Confirmed by Aletta Edouard [phone]) on 12/24/2017 5:08:55 PM   Radiology Ct Chest W Contrast  Result Date: 12/24/2017 CLINICAL DATA:  Abdominal pain and fever. Abscess is suspected. Lethargy and anorexia. Recent diagnosis of colon cancer. EXAM: CT CHEST, ABDOMEN, AND PELVIS WITH CONTRAST TECHNIQUE: Multidetector CT imaging of the chest, abdomen and pelvis was performed following the standard  protocol during bolus administration of intravenous contrast. CONTRAST:  187m OMNIPAQUE IOHEXOL 300 MG/ML  SOLN COMPARISON:  Abdomen and pelvis CT 09/29/2017.  Chest CT 06/28/2017. FINDINGS: CT CHEST FINDINGS Cardiovascular: The heart size is normal. No substantial pericardial effusion. Coronary artery calcification is evident. Atherosclerotic calcification is noted in the wall of the thoracic aorta. Mediastinum/Nodes: No mediastinal lymphadenopathy. There is no hilar lymphadenopathy. The esophagus has normal imaging features. There is no axillary lymphadenopathy. Lungs/Pleura: The central tracheobronchial airways are patent. 7 mm right middle lobe nodule is stable in the interval. Dependent atelectasis noted in the lower lobes bilaterally. No edema or pleural effusion. Musculoskeletal: No worrisome lytic or sclerotic osseous abnormality. CT ABDOMEN PELVIS FINDINGS Hepatobiliary: No focal abnormality within the liver parenchyma. Gallbladder surgically absent. No intrahepatic or extrahepatic biliary dilation. Pancreas: Pancreas is atrophic. Surgical clips are seen in the region of the pancreatic tail. No dilatation of the main pancreatic duct. Spleen: Abnormal soft tissue again noted in the splenic hilum, substantially progressed since 09/29/2017. There is altered perfusion to the spleen, potentially related to sites of infiltrative disease or infarct. Adrenals/Urinary Tract: No adrenal nodule or mass. 7 mm nonobstructing stone identified lower pole right kidney. Left kidney unremarkable. No evidence for hydroureter. The urinary bladder appears normal for the degree of distention. Stomach/Bowel: Stomach is markedly distorted due to a heterogeneous complex left upper lobe process (see below). Duodenum is normally positioned as is the ligament of Treitz. Duodenal diverticuli evident. No small bowel wall thickening. No small bowel dilatation. The terminal ileum is normal. The appendix is normal. Anastomotic suture line  noted in the transverse colon with associated  circumferential wall thickening. The transverse colon in the region the anastomosis is incorporated into the complex left upper quadrant process (see below). Vascular/Lymphatic: There is abdominal aortic atherosclerosis without aneurysm. There is no gastrohepatic or hepatoduodenal ligament lymphadenopathy. No intraperitoneal or retroperitoneal lymphadenopathy. No pelvic sidewall lymphadenopathy. Reproductive: The prostate gland and seminal vesicles have normal imaging features. Other: Complex process in the left upper quadrant involves the stomach, spleen, tail of pancreas, and distal transverse colon in the region of the anastomosis. On the previous study 3 months ago, there was only a 4.7 x 4.7 cm irregular soft tissue mass tethered between the stomach and the splenic hilum. This did track inferiorly and medially towards the tail of pancreas. On today's exam, there is a multi loculated, multicystic process in the left upper quadrant. This includes 2 separate rim enhancing collections in or on the wall of the greater curvature. 1 of these measures 3.7 x 4.7 cm (52/3) and the other more proximal lesion measures 2.4 x 3.6 cm. This latter collection appears to track into an irregular rim enhancing 4.4 x 3.3 cm collection dissecting into the splenic hilum. There is edema and inflammation in this region with fluid tracking throughout the left upper quadrant and around the spleen. Again this process tethers the proximal stomach, transverse colon, spleen, and pancreatic tail. Musculoskeletal: No worrisome lytic or sclerotic osseous abnormality. IMPRESSION: Interval development of a complex multicystic process in the left upper quadrant of the abdomen. There are multiple rim enhancing complex fluid collections ranging in size from about 2 mm up to 5 mm. Two of these collections are along the greater curvature of the stomach and the more distal of the two may be intramural. The  process dissects/insinuates into the splenic hilum and probably infiltrates the splenic parenchyma although compromise of the splenic vascular anatomy in the hilum with subsequent splenic infarct would also be a consideration. The distal transverse colon at the level of the anastomosis is retracted into this process. Imaging features likely reflect multifocal abscess. Compromise of the tethered colon as etiology for these changes would be a consideration. Given the appearance today, cystic metastatic disease is considered less likely given the marked interval change, although this possibility cannot be excluded. Electronically Signed   By: Misty Stanley M.D.   On: 12/24/2017 20:10   Ct Abdomen Pelvis W Contrast  Result Date: 12/24/2017 CLINICAL DATA:  Abdominal pain and fever. Abscess is suspected. Lethargy and anorexia. Recent diagnosis of colon cancer. EXAM: CT CHEST, ABDOMEN, AND PELVIS WITH CONTRAST TECHNIQUE: Multidetector CT imaging of the chest, abdomen and pelvis was performed following the standard protocol during bolus administration of intravenous contrast. CONTRAST:  168m OMNIPAQUE IOHEXOL 300 MG/ML  SOLN COMPARISON:  Abdomen and pelvis CT 09/29/2017.  Chest CT 06/28/2017. FINDINGS: CT CHEST FINDINGS Cardiovascular: The heart size is normal. No substantial pericardial effusion. Coronary artery calcification is evident. Atherosclerotic calcification is noted in the wall of the thoracic aorta. Mediastinum/Nodes: No mediastinal lymphadenopathy. There is no hilar lymphadenopathy. The esophagus has normal imaging features. There is no axillary lymphadenopathy. Lungs/Pleura: The central tracheobronchial airways are patent. 7 mm right middle lobe nodule is stable in the interval. Dependent atelectasis noted in the lower lobes bilaterally. No edema or pleural effusion. Musculoskeletal: No worrisome lytic or sclerotic osseous abnormality. CT ABDOMEN PELVIS FINDINGS Hepatobiliary: No focal abnormality within  the liver parenchyma. Gallbladder surgically absent. No intrahepatic or extrahepatic biliary dilation. Pancreas: Pancreas is atrophic. Surgical clips are seen in the region of the pancreatic tail.  No dilatation of the main pancreatic duct. Spleen: Abnormal soft tissue again noted in the splenic hilum, substantially progressed since 09/29/2017. There is altered perfusion to the spleen, potentially related to sites of infiltrative disease or infarct. Adrenals/Urinary Tract: No adrenal nodule or mass. 7 mm nonobstructing stone identified lower pole right kidney. Left kidney unremarkable. No evidence for hydroureter. The urinary bladder appears normal for the degree of distention. Stomach/Bowel: Stomach is markedly distorted due to a heterogeneous complex left upper lobe process (see below). Duodenum is normally positioned as is the ligament of Treitz. Duodenal diverticuli evident. No small bowel wall thickening. No small bowel dilatation. The terminal ileum is normal. The appendix is normal. Anastomotic suture line noted in the transverse colon with associated circumferential wall thickening. The transverse colon in the region the anastomosis is incorporated into the complex left upper quadrant process (see below). Vascular/Lymphatic: There is abdominal aortic atherosclerosis without aneurysm. There is no gastrohepatic or hepatoduodenal ligament lymphadenopathy. No intraperitoneal or retroperitoneal lymphadenopathy. No pelvic sidewall lymphadenopathy. Reproductive: The prostate gland and seminal vesicles have normal imaging features. Other: Complex process in the left upper quadrant involves the stomach, spleen, tail of pancreas, and distal transverse colon in the region of the anastomosis. On the previous study 3 months ago, there was only a 4.7 x 4.7 cm irregular soft tissue mass tethered between the stomach and the splenic hilum. This did track inferiorly and medially towards the tail of pancreas. On today's exam,  there is a multi loculated, multicystic process in the left upper quadrant. This includes 2 separate rim enhancing collections in or on the wall of the greater curvature. 1 of these measures 3.7 x 4.7 cm (52/3) and the other more proximal lesion measures 2.4 x 3.6 cm. This latter collection appears to track into an irregular rim enhancing 4.4 x 3.3 cm collection dissecting into the splenic hilum. There is edema and inflammation in this region with fluid tracking throughout the left upper quadrant and around the spleen. Again this process tethers the proximal stomach, transverse colon, spleen, and pancreatic tail. Musculoskeletal: No worrisome lytic or sclerotic osseous abnormality. IMPRESSION: Interval development of a complex multicystic process in the left upper quadrant of the abdomen. There are multiple rim enhancing complex fluid collections ranging in size from about 2 mm up to 5 mm. Two of these collections are along the greater curvature of the stomach and the more distal of the two may be intramural. The process dissects/insinuates into the splenic hilum and probably infiltrates the splenic parenchyma although compromise of the splenic vascular anatomy in the hilum with subsequent splenic infarct would also be a consideration. The distal transverse colon at the level of the anastomosis is retracted into this process. Imaging features likely reflect multifocal abscess. Compromise of the tethered colon as etiology for these changes would be a consideration. Given the appearance today, cystic metastatic disease is considered less likely given the marked interval change, although this possibility cannot be excluded. Electronically Signed   By: Misty Stanley M.D.   On: 12/24/2017 20:10   Dg Chest Port 1 View  Result Date: 12/24/2017 CLINICAL DATA:  Fever, malaise and weakness. EXAM: PORTABLE CHEST 1 VIEW COMPARISON:  Chest CT dated 06/28/2017. FINDINGS: Poor inspiration. Borderline enlarged heart. Tortuous  aorta. Mild left basilar predominantly linear density. Minimal diffuse peribronchial thickening and accentuation of the interstitial markings. Thoracic spine degenerative changes. IMPRESSION: 1. Poor inspiration with mild left basilar atelectasis. Underlying pneumonia could not be absolutely excluded. 2. Minimal bronchitic changes.  Electronically Signed   By: Claudie Revering M.D.   On: 12/24/2017 17:17    Procedures .Critical Care Performed by: Hayden Rasmussen, MD Authorized by: Hayden Rasmussen, MD   Critical care provider statement:    Critical care time (minutes):  45   Critical care time was exclusive of:  Separately billable procedures and treating other patients   Critical care was necessary to treat or prevent imminent or life-threatening deterioration of the following conditions:  Sepsis   Critical care was time spent personally by me on the following activities:  Discussions with consultants, evaluation of patient's response to treatment, examination of patient, ordering and performing treatments and interventions, ordering and review of laboratory studies, ordering and review of radiographic studies, pulse oximetry, re-evaluation of patient's condition, obtaining history from patient or surrogate, review of old charts and development of treatment plan with patient or surrogate   I assumed direction of critical care for this patient from another provider in my specialty: no     (including critical care time)  Medications Ordered in ED Medications  cefTRIAXone (ROCEPHIN) 2 g in sodium chloride 0.9 % 100 mL IVPB (0 g Intravenous Stopped 12/24/17 2204)  multivitamin with minerals tablet 1 tablet (has no administration in time range)  sulfaSALAzine (AZULFIDINE) EC tablet 1,500 mg (has no administration in time range)  rivaroxaban (XARELTO) tablet 20 mg (has no administration in time range)  spironolactone (ALDACTONE) tablet 25 mg (has no administration in time range)  acetaminophen  (TYLENOL) tablet 500 mg (has no administration in time range)  colestipol (COLESTID) tablet 1 g (1 g Oral Not Given 12/24/17 2325)  diphenhydrAMINE (BENADRYL) capsule 25 mg (has no administration in time range)  0.9 %  sodium chloride infusion ( Intravenous New Bag/Given 12/24/17 2316)  ondansetron (ZOFRAN) tablet 4 mg (has no administration in time range)    Or  ondansetron (ZOFRAN) injection 4 mg (has no administration in time range)  metroNIDAZOLE (FLAGYL) IVPB 500 mg (500 mg Intravenous New Bag/Given 12/24/17 2330)  sodium chloride 0.9 % bolus 1,000 mL (0 mLs Intravenous Stopped 12/24/17 1819)    And  sodium chloride 0.9 % bolus 1,000 mL (0 mLs Intravenous Stopped 12/24/17 2008)    And  sodium chloride 0.9 % bolus 500 mL (0 mLs Intravenous Stopped 12/24/17 2153)  acetaminophen (TYLENOL) tablet 650 mg (650 mg Oral Given 12/24/17 1737)  iohexol (OMNIPAQUE) 300 MG/ML solution 100 mL (100 mLs Intravenous Contrast Given 12/24/17 1924)     Initial Impression / Assessment and Plan / ED Course  I have reviewed the triage vital signs and the nursing notes.  Pertinent labs & imaging results that were available during my care of the patient were reviewed by me and considered in my medical decision making (see chart for details).  Clinical Course as of Dec 24 2336  Fri Dec 24, 2017  1707 Patient is here with a week's worth of malaise body aches headache fevers and chills decreased appetite.  Reportedly in the PCPs office his sats were in the 80s but they have been in the high 90s since he is been here off of oxygen.  He does not have anything focal on exam.  I have started off work him up as sepsis.   [MB]  [id number] Patient still has not been able to pee.  His vitals remained stable he just looks kind of wiped out.  Does have an elevated white count and is on steroids.  Rest of his lab work is  been fairly unremarkable other than a mildly bumped lactic acid at 1.93.  Still waiting on flu testing to be  done.  He says he was due for another CAT scan abdomen at the end of the month so I may put him in for chest and abdomen now to see if he can come up with any kind of source for him   [MB]  2029 Discussed with Dr. Ninfa Linden from general surgery who does not feel the patient needs emergent surgery and should be admitted to medicine but they will follow along.  Patient and family updated.  Antibiotics have been ordered.   [MB]  2050 Discussed with Dr. Jonelle Sidle from [facility] who will evaluate the patient for admission.   [MB]    Clinical Course User Index [MB] Hayden Rasmussen, MD     Final Clinical Impressions(s) / ED Diagnoses   Final diagnoses:  Sepsis, due to unspecified organism, unspecified whether acute organ dysfunction present [facility])  Intra-abdominal abscess [facility])  Malignant neoplasm of colon, unspecified part of colon [facility])    ED Discharge Orders    None       Hayden Rasmussen, MD 12/24/17 2341

## 2017-12-24 NOTE — ED Notes (Signed)
Report called to rn on 4e

## 2017-12-24 NOTE — ED Triage Notes (Signed)
The pt arrived by gems from a doctors office  No meal for one week  Feeling weak and not feeling well  Recent diagnosis of colon cancer  On arrival alert skin warm and dry no distress  Last bm this am

## 2017-12-24 NOTE — Consult Note (Signed)
Reason for Consult:possible intra-abdominal abscess Referring Physician: Dr. Jerilynn Mages. Cody Suarez is an 72 y.o. male.  HPI: This is a 72 year old gentleman with a history of metastatic colon cancer.  He had a previous partial colectomy in 2015 by Dr. Hassell Done.  He developed metastatic disease and in 2017 had a distal pancreatectomy secondary to a large metastatic implant.  He has been followed closely by medical oncology.  For the last week he has not been feeling well with generalized malaise, lack of appetite, chills, and fever.  He is currently not on chemotherapy.  He had one episode of emesis since admission.  Bowel movements have been normal.  He underwent a CT scan of his abdomen and pelvis that have showed his previous irregular soft tissue mass between the stomach and the splenic hilum which is known malignancy that had been biopsied.  He now has multiple multicystic collections in the left upper quadrant which may represent abscesses versus further metastatic disease.  Again, he currently denies abdominal pain.  Past Medical History:  Diagnosis Date  . Anemia of chronic disease 2015   "related to cancer tx" (08/09/2013)  . Blood clot in vein 2015  . Colon cancer ([place]) 03/2013  . History of kidney stones    x 2passed them both  . Hypertension   . Peripheral vascular disease ([place])   . Ulcerative colitis ([place])    history    Past Surgical History:  Procedure Laterality Date  . ABDOMINAL AORTAGRAM N/A 08/09/2013   Procedure: ABDOMINAL Maxcine Ham;  Surgeon: [name], MD;  Location: [facility];  Service: Cardiovascular;  Laterality: N/A;  . CHOLECYSTECTOMY N/A 09/08/2014   Procedure: LAPAROSCOPIC CHOLECYSTECTOMY WITH INTRAOPERATIVE CHOLANGIOGRAM;  Surgeon: Armandina Gemma, MD;  Location: [facility];  Service: General;  Laterality: N/A;  . COLON SURGERY  02/2013  . COLON SURGERY  10/2015  . FEMORAL-POPLITEAL BYPASS GRAFT Right 08/11/2013   Procedure:   RIGHT - POPLITEAL TO PERONEAL ARTERY  BYPASS GRAFT  WITH NONREVERSED SAPHENOUS VEIN GRAFT,tHROMBECTOMY ANTERIOR TIBIALIS,ATTEMPTED THROMBECTOMY TIBIO-PERONEAL TRUNK AND POSTERIOR TIBIALIS, INTRAOPERATIVE ARTERIOGRAM.;  Surgeon: Mal Misty, MD;  Location: [employer] [facility];  Service: Vascular;  Laterality: Right;  . FEMORAL-TIBIAL BYPASS GRAFT Right 11/17/2013   Procedure: BYPASS GRAFT RIGHT ABOVE KNEE POPLITEAL TO POSTERIOR TIBIAL ARTERY USING RIGHT NON-REVERSED CEPHALIC VEIN;  Surgeon: Mal Misty, MD;  Location: [facility];  Service: Vascular;  Laterality: Right;  . HERNIA REPAIR  ~ 1995; 03/2013   UHR  . INTRAOPERATIVE ARTERIOGRAM Right 11/17/2013   Procedure: INTRA OPERATIVE ARTERIOGRAM;  Surgeon: Mal Misty, MD;  Location: [facility] OR;  Service: Vascular;  Laterality: Right;  . IR GENERIC HISTORICAL  12/11/2015   IR RADIOLOGIST EVAL & MGMT 12/11/2015 Arne Cleveland, MD [facility]  . IR GENERIC HISTORICAL  12/24/2015   IR RADIOLOGIST EVAL & MGMT 12/24/2015 Jacqulynn Cadet, MD [facility]  . IR GENERIC HISTORICAL  01/21/2016   IR RADIOLOGIST EVAL & MGMT 01/21/2016 [name], MD [facility]  . IR GENERIC HISTORICAL  02/04/2016   IR RADIOLOGIST EVAL & MGMT 02/04/2016 Sandi Mariscal, MD [facility]  . IR GENERIC HISTORICAL  02/12/2016   IR CATHETER TUBE CHANGE 02/12/2016 Corrie Mckusick, DO [facility]  . IR GENERIC HISTORICAL  02/20/2016   IR RADIOLOGIST EVAL & MGMT 02/20/2016 Markus Daft, MD [facility]  . LAPAROSCOPIC RIGHT HEMI COLECTOMY N/A 03/28/2013   Procedure: LAPAROSCOPIC ASSISTED HEMI COLECTOMY;  Surgeon: Pedro Earls, MD;  [facility];  Service: General;  Laterality: N/A;  . LAPAROSCOPY N/A 11/05/2015   Procedure: LAPAROSCOPY DIAGNOSTIC;  Surgeon: [name], MD;  [facility];  Service: General;  Laterality: N/A;  . LAPAROTOMY N/A 11/05/2015   Procedure: EXPLORATORY LAPAROTOMY, resection of mass at tail of pancreas;  Surgeon: [name], MD;  [facility];  Service: General;  Laterality:  N/A;  . LOWER EXTREMITY ANGIOGRAM Bilateral 08/09/2013  . TEE WITHOUT CARDIOVERSION N/A 08/10/2013   Procedure: TRANSESOPHAGEAL ECHOCARDIOGRAM (TEE);  Surgeon: Candee Furbish, MD;  Location: [facility]OS[facility];  Service: Cardiovascular;  Laterality: N/A;  . TONSILLECTOMY  ~ 1950  . [place]; 03/2013  . VASECTOMY    . VEIN HARVEST Right 11/17/2013   Procedure: HARVEST OF RIGHT UPPER EXTREMITY CEPHALIC VEIN;  Surgeon: Mal Misty, MD;  Location: [facility];  Service: Vascular;  Laterality: Right;    Family History  Problem Relation Age of Onset  . Heart disease Mother   . Emphysema Father     Social History:  reports that he has never smoked. He has never used smokeless tobacco. He reports that he does not drink alcohol or use drugs.  Allergies:  Allergies  Allergen Reactions  . Amlodipine Swelling    Right LE edema  . Gabapentin Rash    Short-term memory decrease    Medications: I have reviewed the patient's current medications.  Results for orders placed or performed during the hospital encounter of 12/24/17 (from the past 48 hour(s))  Comprehensive metabolic panel     Status: Abnormal   Collection Time: 12/24/17  4:42 PM  Result Value Ref Range   Sodium 131 (L) 135 - 145 mmol/L   Potassium 3.8 3.5 - 5.1 mmol/L   Chloride 94 (L) 98 - 111 mmol/L   CO2 22 22 - 32 mmol/L   Glucose, Bld 162 (H) 70 - 99 mg/dL   BUN 12 8 - 23 mg/dL   Creatinine, Ser 1.29 (H) 0.61 - 1.24 mg/dL   Calcium 8.7 (L) 8.9 - 10.3 mg/dL   Total Protein 7.8 6.5 - 8.1 g/dL   Albumin 3.0 (L) 3.5 - 5.0 g/dL   AST 15 15 - 41 U/L   ALT 13 0 - 44 U/L   Alkaline Phosphatase 75 38 - 126 U/L   Total Bilirubin 1.0 0.3 - 1.2 mg/dL   GFR calc non Af Amer 55 (L) >60 mL/min   GFR calc Af Amer >60 >60 mL/min   Anion gap 15 5 - 15    Comment: Performed at [facility], [address] [place], [place] [address]  CBC WITH DIFFERENTIAL     Status: Abnormal   Collection Time: 12/24/17  4:42 PM  Result  Value Ref Range   WBC 25.2 (H) 4.0 - 10.5 K/uL   RBC 4.64 4.22 - 5.81 MIL/uL   Hemoglobin 11.8 (L) 13.0 - 17.0 g/dL   HCT 37.2 (L) 39.0 - 52.0 %   MCV 80.2 80.0 - 100.0 fL   MCH 25.4 (L) 26.0 - 34.0 pg   MCHC 31.7 30.0 - 36.0 g/dL   RDW 18.8 (H) 11.5 - 15.5 %   Platelets 493 (H) 150 - 400 K/uL   nRBC 0.0 0.0 - 0.2 %   Neutrophils Relative % 81 %   Neutro Abs 20.4 (H) 1.7 - 7.7 K/uL   Lymphocytes Relative 6 %   Lymphs Abs 1.5 0.7 - 4.0 K/uL   Monocytes Relative 11 %  Monocytes Absolute 2.8 (H) 0.1 - 1.0 K/uL   Eosinophils Relative 0 %   Eosinophils Absolute 0.0 0.0 - 0.5 K/uL   Basophils Relative 2 %   Basophils Absolute 0.5 (H) 0.0 - 0.1 K/uL   WBC Morphology See Note     Comment: >10% reactive, Benign Lymphocytes.   nRBC 0 0 /100 WBC   Abs Immature Granulocytes 0.00 0.00 - 0.07 K/uL   Burr Cells PRESENT     Comment: Performed at [facility], [place] [address] [place], [place] [address]  Protime-INR     Status: Abnormal   Collection Time: 12/24/17  4:42 PM  Result Value Ref Range   Prothrombin Time 34.2 (H) 11.4 - 15.2 seconds   INR 3.45     Comment: Performed at [facility] [address] [place], [place] [address]  I-stat troponin, ED (not at [facility])     Status: None   Collection Time: 12/24/17  5:03 PM  Result Value Ref Range   Troponin i, poc 0.01 0.00 - 0.08 ng/mL   Comment 3            Comment: Due to the release kinetics of cTnI, a negative result within the first hours of the onset of symptoms does not rule out myocardial infarction with certainty. If myocardial infarction is still suspected, repeat the test at appropriate intervals.   I-Stat CG4 Lactic Acid, ED     Status: Abnormal   Collection Time: 12/24/17  5:05 PM  Result Value Ref Range   Lactic Acid, Venous 1.93 (H) 0.5 - 1.9 mmol/L  Influenza panel by PCR (type A & B)     Status: None   Collection Time: 12/24/17  6:36 PM  Result Value Ref Range   Influenza A By PCR NEGATIVE  NEGATIVE   Influenza B By PCR NEGATIVE NEGATIVE    Comment: (NOTE) The Xpert Xpress Flu assay is intended as an aid in the diagnosis of  influenza and should not be used as a sole basis for treatment.  This  assay is FDA approved for nasopharyngeal swab specimens only. Nasal  washings and aspirates are unacceptable for Xpert Xpress Flu testing. Performed at [facility], [place] [address], [place], [place] [address]   Urinalysis, Routine w reflex microscopic     Status: Abnormal   Collection Time: 12/24/17  7:50 PM  Result Value Ref Range   Color, Urine YELLOW YELLOW   APPearance CLEAR CLEAR   Specific Gravity, Urine 1.012 1.005 - 1.030   pH 6.0 5.0 - 8.0   Glucose, UA NEGATIVE NEGATIVE mg/dL   Hgb urine dipstick NEGATIVE NEGATIVE   Bilirubin Urine NEGATIVE NEGATIVE   Ketones, ur 20 (A) NEGATIVE mg/dL   Protein, ur NEGATIVE NEGATIVE mg/dL   Nitrite NEGATIVE NEGATIVE   Leukocytes, UA NEGATIVE NEGATIVE    Comment: Performed at [facility] [address] [place], [place] [address]    Ct Chest W Contrast  Result Date: 12/24/2017 CLINICAL DATA:  Abdominal pain and fever. Abscess is suspected. Lethargy and anorexia. Recent diagnosis of colon cancer. EXAM: CT CHEST, ABDOMEN, AND PELVIS WITH CONTRAST TECHNIQUE: Multidetector CT imaging of the chest, abdomen and pelvis was performed following the standard protocol during bolus administration of intravenous contrast. CONTRAST:  171m OMNIPAQUE IOHEXOL 300 MG/ML  SOLN COMPARISON:  Abdomen and pelvis CT 09/29/2017.  Chest CT 06/28/2017. FINDINGS: CT CHEST FINDINGS Cardiovascular: The heart size is normal. No substantial pericardial effusion. Coronary  artery calcification is evident. Atherosclerotic calcification is noted in the wall of the thoracic aorta. Mediastinum/Nodes: No mediastinal lymphadenopathy. There is no hilar lymphadenopathy. The esophagus has normal imaging features. There is no axillary lymphadenopathy. Lungs/Pleura: The  central tracheobronchial airways are patent. 7 mm right middle lobe nodule is stable in the interval. Dependent atelectasis noted in the lower lobes bilaterally. No edema or pleural effusion. Musculoskeletal: No worrisome lytic or sclerotic osseous abnormality. CT ABDOMEN PELVIS FINDINGS Hepatobiliary: No focal abnormality within the liver parenchyma. Gallbladder surgically absent. No intrahepatic or extrahepatic biliary dilation. Pancreas: Pancreas is atrophic. Surgical clips are seen in the region of the pancreatic tail. No dilatation of the main pancreatic duct. Spleen: Abnormal soft tissue again noted in the splenic hilum, substantially progressed since 09/29/2017. There is altered perfusion to the spleen, potentially related to sites of infiltrative disease or infarct. Adrenals/Urinary Tract: No adrenal nodule or mass. 7 mm nonobstructing stone identified lower pole right kidney. Left kidney unremarkable. No evidence for hydroureter. The urinary bladder appears normal for the degree of distention. Stomach/Bowel: Stomach is markedly distorted due to a heterogeneous complex left upper lobe process (see below). Duodenum is normally positioned as is the ligament of Treitz. Duodenal diverticuli evident. No small bowel wall thickening. No small bowel dilatation. The terminal ileum is normal. The appendix is normal. Anastomotic suture line noted in the transverse colon with associated circumferential wall thickening. The transverse colon in the region the anastomosis is incorporated into the complex left upper quadrant process (see below). Vascular/Lymphatic: There is abdominal aortic atherosclerosis without aneurysm. There is no gastrohepatic or hepatoduodenal ligament lymphadenopathy. No intraperitoneal or retroperitoneal lymphadenopathy. No pelvic sidewall lymphadenopathy. Reproductive: The prostate gland and seminal vesicles have normal imaging features. Other: Complex process in the left upper quadrant involves  the stomach, spleen, tail of pancreas, and distal transverse colon in the region of the anastomosis. On the previous study 3 months ago, there was only a 4.7 x 4.7 cm irregular soft tissue mass tethered between the stomach and the splenic hilum. This did track inferiorly and medially towards the tail of pancreas. On today's exam, there is a multi loculated, multicystic process in the left upper quadrant. This includes 2 separate rim enhancing collections in or on the wall of the greater curvature. 1 of these measures 3.7 x 4.7 cm (52/3) and the other more proximal lesion measures 2.4 x 3.6 cm. This latter collection appears to track into an irregular rim enhancing 4.4 x 3.3 cm collection dissecting into the splenic hilum. There is edema and inflammation in this region with fluid tracking throughout the left upper quadrant and around the spleen. Again this process tethers the proximal stomach, transverse colon, spleen, and pancreatic tail. Musculoskeletal: No worrisome lytic or sclerotic osseous abnormality. IMPRESSION: Interval development of a complex multicystic process in the left upper quadrant of the abdomen. There are multiple rim enhancing complex fluid collections ranging in size from about 2 mm up to 5 mm. Two of these collections are along the greater curvature of the stomach and the more distal of the two may be intramural. The process dissects/insinuates into the splenic hilum and probably infiltrates the splenic parenchyma although compromise of the splenic vascular anatomy in the hilum with subsequent splenic infarct would also be a consideration. The distal transverse colon at the level of the anastomosis is retracted into this process. Imaging features likely reflect multifocal abscess. Compromise of the tethered colon as etiology for these changes would be a consideration. Given the appearance  today, cystic metastatic disease is considered less likely given the marked interval change, although this  possibility cannot be excluded. Electronically Signed   By: Misty Stanley M.D.   On: 12/24/2017 20:10   Ct Abdomen Pelvis W Contrast  Result Date: 12/24/2017 CLINICAL DATA:  Abdominal pain and fever. Abscess is suspected. Lethargy and anorexia. Recent diagnosis of colon cancer. EXAM: CT CHEST, ABDOMEN, AND PELVIS WITH CONTRAST TECHNIQUE: Multidetector CT imaging of the chest, abdomen and pelvis was performed following the standard protocol during bolus administration of intravenous contrast. CONTRAST:  151m OMNIPAQUE IOHEXOL 300 MG/ML  SOLN COMPARISON:  Abdomen and pelvis CT 09/29/2017.  Chest CT 06/28/2017. FINDINGS: CT CHEST FINDINGS Cardiovascular: The heart size is normal. No substantial pericardial effusion. Coronary artery calcification is evident. Atherosclerotic calcification is noted in the wall of the thoracic aorta. Mediastinum/Nodes: No mediastinal lymphadenopathy. There is no hilar lymphadenopathy. The esophagus has normal imaging features. There is no axillary lymphadenopathy. Lungs/Pleura: The central tracheobronchial airways are patent. 7 mm right middle lobe nodule is stable in the interval. Dependent atelectasis noted in the lower lobes bilaterally. No edema or pleural effusion. Musculoskeletal: No worrisome lytic or sclerotic osseous abnormality. CT ABDOMEN PELVIS FINDINGS Hepatobiliary: No focal abnormality within the liver parenchyma. Gallbladder surgically absent. No intrahepatic or extrahepatic biliary dilation. Pancreas: Pancreas is atrophic. Surgical clips are seen in the region of the pancreatic tail. No dilatation of the main pancreatic duct. Spleen: Abnormal soft tissue again noted in the splenic hilum, substantially progressed since 09/29/2017. There is altered perfusion to the spleen, potentially related to sites of infiltrative disease or infarct. Adrenals/Urinary Tract: No adrenal nodule or mass. 7 mm nonobstructing stone identified lower pole right kidney. Left kidney  unremarkable. No evidence for hydroureter. The urinary bladder appears normal for the degree of distention. Stomach/Bowel: Stomach is markedly distorted due to a heterogeneous complex left upper lobe process (see below). Duodenum is normally positioned as is the ligament of Treitz. Duodenal diverticuli evident. No small bowel wall thickening. No small bowel dilatation. The terminal ileum is normal. The appendix is normal. Anastomotic suture line noted in the transverse colon with associated circumferential wall thickening. The transverse colon in the region the anastomosis is incorporated into the complex left upper quadrant process (see below). Vascular/Lymphatic: There is abdominal aortic atherosclerosis without aneurysm. There is no gastrohepatic or hepatoduodenal ligament lymphadenopathy. No intraperitoneal or retroperitoneal lymphadenopathy. No pelvic sidewall lymphadenopathy. Reproductive: The prostate gland and seminal vesicles have normal imaging features. Other: Complex process in the left upper quadrant involves the stomach, spleen, tail of pancreas, and distal transverse colon in the region of the anastomosis. On the previous study 3 months ago, there was only a 4.7 x 4.7 cm irregular soft tissue mass tethered between the stomach and the splenic hilum. This did track inferiorly and medially towards the tail of pancreas. On today's exam, there is a multi loculated, multicystic process in the left upper quadrant. This includes 2 separate rim enhancing collections in or on the wall of the greater curvature. 1 of these measures 3.7 x 4.7 cm (52/3) and the other more proximal lesion measures 2.4 x 3.6 cm. This latter collection appears to track into an irregular rim enhancing 4.4 x 3.3 cm collection dissecting into the splenic hilum. There is edema and inflammation in this region with fluid tracking throughout the left upper quadrant and around the spleen. Again this process tethers the proximal stomach,  transverse colon, spleen, and pancreatic tail. Musculoskeletal: No worrisome lytic or sclerotic osseous  abnormality. IMPRESSION: Interval development of a complex multicystic process in the left upper quadrant of the abdomen. There are multiple rim enhancing complex fluid collections ranging in size from about 2 mm up to 5 mm. Two of these collections are along the greater curvature of the stomach and the more distal of the two may be intramural. The process dissects/insinuates into the splenic hilum and probably infiltrates the splenic parenchyma although compromise of the splenic vascular anatomy in the hilum with subsequent splenic infarct would also be a consideration. The distal transverse colon at the level of the anastomosis is retracted into this process. Imaging features likely reflect multifocal abscess. Compromise of the tethered colon as etiology for these changes would be a consideration. Given the appearance today, cystic metastatic disease is considered less likely given the marked interval change, although this possibility cannot be excluded. Electronically Signed   By: Misty Stanley M.D.   On: 12/24/2017 20:10   Dg Chest Port 1 View  Result Date: 12/24/2017 CLINICAL DATA:  Fever, malaise and weakness. EXAM: PORTABLE CHEST 1 VIEW COMPARISON:  Chest CT dated 06/28/2017. FINDINGS: Poor inspiration. Borderline enlarged heart. Tortuous aorta. Mild left basilar predominantly linear density. Minimal diffuse peribronchial thickening and accentuation of the interstitial markings. Thoracic spine degenerative changes. IMPRESSION: 1. Poor inspiration with mild left basilar atelectasis. Underlying pneumonia could not be absolutely excluded. 2. Minimal bronchitic changes. Electronically Signed   By: Claudie Revering M.D.   On: 12/24/2017 17:17    Review of Systems  Constitutional: Positive for chills, fever and malaise/fatigue.  Respiratory: Negative for cough and shortness of breath.   Cardiovascular:  Negative for chest pain.  Gastrointestinal: Positive for vomiting. Negative for abdominal pain, diarrhea and nausea.  Genitourinary: Negative for dysuria.  All other systems reviewed and are negative.  Blood pressure (!) 148/59, pulse (!) 59, temperature 98.8 F (37.1 C), temperature source Oral, resp. rate (!) 30, height 5' 10"  (1.778 m), weight 77.6 kg, SpO2 100 %. Physical Exam  Constitutional: He is oriented to person, place, and time. He appears well-developed and well-nourished. No distress.  HENT:  Head: Normocephalic and atraumatic.  Right Ear: External ear normal.  Left Ear: External ear normal.  Nose: Nose normal.  Mouth/Throat: Oropharynx is clear and moist. No oropharyngeal exudate.  Eyes: Pupils are equal, round, and reactive to light. Right eye exhibits no discharge. Left eye exhibits no discharge. No scleral icterus.  Neck: Normal range of motion. Neck supple. No tracheal deviation present.  Cardiovascular: Regular rhythm and normal heart sounds.  No murmur heard. Slightly tachycardic  Respiratory: Effort normal and breath sounds normal. No respiratory distress. He has no wheezes.  GI: Soft. He exhibits no distension. There is no abdominal tenderness. There is no rebound.  Musculoskeletal: Normal range of motion.        General: No tenderness, deformity or edema.  Neurological: He is alert and oriented to person, place, and time.  Skin: Skin is warm and dry. No erythema.  Psychiatric: His behavior is normal. Judgment normal.    Assessment/Plan: Metastatic colon cancer with possible intra-abdominal abscesses versus extension of the disease  Given his elevated white blood count and chills, this may represent abscesses that have developed since his IR guided biopsy.  I believe IR will need to be asked to evaluate the patient tomorrow to consider either aspiration or placing drains.  The patient is very reluctant to have any procedure done without the opinion of his  oncologist, Dr. Benay Spice.  I believe  medical oncology needs to be consulted.  He is definitely not interested in any surgical invention which I do not believe is necessary.  We will follow him along with you.  Coralie Keens 12/24/2017, 11:08 PM

## 2017-12-25 ENCOUNTER — Encounter (HOSPITAL_COMMUNITY): Payer: Self-pay

## 2017-12-25 LAB — CBC
HCT: 30.4 % — ABNORMAL LOW (ref 39.0–52.0)
Hemoglobin: 9.6 g/dL — ABNORMAL LOW (ref 13.0–17.0)
MCH: 25.2 pg — ABNORMAL LOW (ref 26.0–34.0)
MCHC: 31.6 g/dL (ref 30.0–36.0)
MCV: 79.8 fL — ABNORMAL LOW (ref 80.0–100.0)
Platelets: 402 10*3/uL — ABNORMAL HIGH (ref 150–400)
RBC: 3.81 MIL/uL — ABNORMAL LOW (ref 4.22–5.81)
RDW: 18.9 % — ABNORMAL HIGH (ref 11.5–15.5)
WBC: 20.3 10*3/uL — ABNORMAL HIGH (ref 4.0–10.5)
nRBC: 0 % (ref 0.0–0.2)

## 2017-12-25 LAB — COMPREHENSIVE METABOLIC PANEL
ALT: 9 U/L (ref 0–44)
AST: 11 U/L — ABNORMAL LOW (ref 15–41)
Albumin: 2.1 g/dL — ABNORMAL LOW (ref 3.5–5.0)
Alkaline Phosphatase: 63 U/L (ref 38–126)
Anion gap: 13 (ref 5–15)
BUN: 10 mg/dL (ref 8–23)
CO2: 19 mmol/L — AB (ref 22–32)
Calcium: 7.5 mg/dL — ABNORMAL LOW (ref 8.9–10.3)
Chloride: 101 mmol/L (ref 98–111)
Creatinine, Ser: 1.21 mg/dL (ref 0.61–1.24)
GFR calc Af Amer: >60 mL/min (ref >60)
GFR calc non Af Amer: 59 mL/min — ABNORMAL LOW (ref >60)
Glucose, Bld: 161 mg/dL — ABNORMAL HIGH (ref 70–99)
Potassium: 3.9 mmol/L (ref 3.5–5.1)
SODIUM: 133 mmol/L — AB (ref 135–145)
Total Bilirubin: 0.7 mg/dL (ref 0.3–1.2)
Total Protein: 5.9 g/dL — ABNORMAL LOW (ref 6.5–8.1)

## 2017-12-25 MED ORDER — MEGESTROL ACETATE 40 MG PO TABS
40.0000 mg | ORAL_TABLET | Freq: Every day | ORAL | Status: DC
  Administered 2017-12-25: 40 mg via ORAL
  Filled 2017-12-25 (×4): qty 1

## 2017-12-25 NOTE — Consult Note (Signed)
Chief Complaint: Patient was seen in consultation today for intraabdominal abscess.  Referring Physician(s): Shelly Coss  Supervising Physician: Aletta Edouard  Patient Status: [facility] - In-pt  History of Present Illness: Cody Suarez is a 72 y.o. male with a past medical history of hypertension, PVD, DVT, ulcerative colitis, colon cancer, nephrolithiasis, and anemia of chronic disease. He presented to [facility] ED 12/25/2017 with complaint of weakness. He was found to be septic secondary to pneumonia versus intraabdominal abscess and was admitted for management.  CT abdomen/pelvis 12/24/2017: 1. Interval development of a complex multicystic process in the left upper quadrant of the abdomen. There are multiple rim enhancing complex fluid collections ranging in size from about 2 mm up to 5 mm. Two of these collections are along the greater curvature of the stomach and the more distal of the two may be intramural. The process dissects/insinuates into the splenic hilum and probably infiltrates the splenic parenchyma although compromise of the splenic vascular anatomy in the hilum with subsequent splenic infarct would also be a consideration. The distal transverse colon at the level of the anastomosis is retracted into this process. Imaging features likely reflect multifocal abscess. Compromise of the tethered colon as etiology for these changes would be a consideration. Given the appearance today, cystic metastatic disease is considered less likely given the marked interval change, although this possibility cannot be excluded.  IR requested by Dr. Tawanna Solo for possible image-guided percutaneous intraabdominal abscess drain placement. Patient awake and alert laying in bed. Complains of abdominal pain, stable at this time. Denies fever, chills, chest pain, dyspnea, dizziness, or headache.  Patient is currently taking Xarelto 20 mg once daily, last dose 0832 this AM.   Past Medical History:    Diagnosis Date  . Anemia of chronic disease 2015   "related to cancer tx" (08/09/2013)  . Blood clot in vein 2015  . Colon cancer ([place]) 03/2013  . History of kidney stones    x 2passed them both  . Hypertension   . Peripheral vascular disease ([place])   . Ulcerative colitis ([place])    history    Past Surgical History:  Procedure Laterality Date  . ABDOMINAL AORTAGRAM N/A 08/09/2013   Procedure: ABDOMINAL Maxcine Ham;  Surgeon: [name], MD;  Location: [facility] CATH LAB;  Service: Cardiovascular;  Laterality: N/A;  . CHOLECYSTECTOMY N/A 09/08/2014   Procedure: LAPAROSCOPIC CHOLECYSTECTOMY WITH INTRAOPERATIVE CHOLANGIOGRAM;  Surgeon: Armandina Gemma, MD;  Location: [facility] ORS;  Service: General;  Laterality: N/A;  . COLON SURGERY  02/2013  . COLON SURGERY  10/2015  . FEMORAL-POPLITEAL BYPASS GRAFT Right 08/11/2013   Procedure:   RIGHT - POPLITEAL TO PERONEAL ARTERY BYPASS GRAFT  WITH NONREVERSED SAPHENOUS VEIN GRAFT,tHROMBECTOMY ANTERIOR TIBIALIS,ATTEMPTED THROMBECTOMY TIBIO-PERONEAL TRUNK AND POSTERIOR TIBIALIS, INTRAOPERATIVE ARTERIOGRAM.;  Surgeon: Mal Misty, MD;  Location: [place];  Service: Vascular;  Laterality: Right;  . FEMORAL-TIBIAL BYPASS GRAFT Right 11/17/2013   Procedure: BYPASS GRAFT RIGHT ABOVE KNEE POPLITEAL TO POSTERIOR TIBIAL ARTERY USING RIGHT NON-REVERSED CEPHALIC VEIN;  Surgeon: Mal Misty, MD;  Location: [place];  Service: Vascular;  Laterality: Right;  . HERNIA REPAIR  ~ 1995; 03/2013   UHR  . INTRAOPERATIVE ARTERIOGRAM Right 11/17/2013   Procedure: INTRA OPERATIVE ARTERIOGRAM;  Surgeon: Mal Misty, MD;  Location: [facility] OR;  Service: Vascular;  Laterality: Right;  . IR GENERIC HISTORICAL  12/11/2015   IR RADIOLOGIST EVAL & MGMT 12/11/2015 Arne Cleveland, MD [facility] INTERV RAD  . IR GENERIC HISTORICAL  12/24/2015  IR RADIOLOGIST EVAL & MGMT 12/24/2015 Jacqulynn Cadet, MD [facility] INTER[facility] RAD  . IR GENERIC HISTORICAL  01/21/2016   IR RADIOLOGIST EVAL & MGMT 01/21/2016 [name], MD  [facility] INTERV RAD  . IR GENERIC HISTORICAL  02/04/2016   IR RADIOLOGIST EVAL & MGMT 02/04/2016 Sandi Mariscal, MD [facility] INTERV RAD  . IR GENERIC HISTORICAL  02/12/2016   IR CATHETER TUBE CHANGE 02/12/2016 Corrie Mckusick, DO [facility]  . IR GENERIC HISTORICAL  02/20/2016   IR RADIOLOGIST EVAL & MGMT 02/20/2016 Markus Daft, MD [facility] INTER[facility] RAD  . LAPAROSCOPIC RIGHT HEMI COLECTOMY N/A 03/28/2013   Procedure: LAPAROSCOPIC ASSISTED HEMI COLECTOMY;  Surgeon: Pedro Earls, MD;  Location: [facility];  Service: General;  Laterality: N/A;  . LAPAROSCOPY N/A 11/05/2015   Procedure: LAPAROSCOPY DIAGNOSTIC;  Surgeon: [name], MD;  Location: [facility];  Service: General;  Laterality: N/A;  . LAPAROTOMY N/A 11/05/2015   Procedure: EXPLORATORY LAPAROTOMY, resection of mass at tail of pancreas;  Surgeon: [name], MD;  Location: [facility];  Service: General;  Laterality: N/A;  . LOWER EXTREMITY ANGIOGRAM Bilateral 08/09/2013  . TEE WITHOUT CARDIOVERSION N/A 08/10/2013   Procedure: TRANSESOPHAGEAL ECHOCARDIOGRAM (TEE);  Surgeon: Candee Furbish, MD;  Location: [facility] ENDOSCOPY;  Service: Cardiovascular;  Laterality: N/A;  . TONSILLECTOMY  ~ 1950  . [place]; 03/2013  . VASECTOMY    . VEIN HARVEST Right 11/17/2013   Procedure: HARVEST OF RIGHT UPPER EXTREMITY CEPHALIC VEIN;  Surgeon: Mal Misty, MD;  Location: [place];  Service: Vascular;  Laterality: Right;    Allergies: Amlodipine and Gabapentin  Medications: Prior to Admission medications   Medication Sig Start Date End Date Taking? Authorizing Provider  acetaminophen (TYLENOL) 500 MG tablet Take 1,000 mg by mouth daily as needed for headache (pain).    Yes [provider]  Coenzyme Q10 (COQ10 PO) Take 1 capsule by mouth daily.   Yes [provider]  diphenhydramine-acetaminophen (TYLENOL PM) 25-500 MG TABS tablet Take 2 tablets by mouth at bedtime as needed (sleep).   Yes [provider]  Homeopathic Products  ([place] EX) Apply 1 application topically daily as needed (calf pain).   Yes [provider]  Liniments (BLUE-EMU SUPER STRENGTH EX) Apply 1 application topically daily as needed (knee pain).   Yes [provider]  losartan (COZAAR) 100 MG tablet Take 0.5 tablets (50 mg total) by mouth daily. 10/21/17  Yes [name], MD  Multiple Vitamin (MULTIVITAMIN WITH MINERALS) TABS tablet Take 1 tablet by mouth daily.   Yes [provider]  predniSONE (DELTASONE) 5 MG tablet Take 5 mg by mouth daily.  03/29/17  Yes [provider]  rivaroxaban (XARELTO) 20 MG TABS tablet Take 1 tablet (20 mg total) by mouth daily with breakfast. Lot [id number]  06/2019 x2 11/26/17  Yes [name], MD  spironolactone (ALDACTONE) 25 MG tablet TAKE 1 TABLET BY MOUTH ONCE DAILY Patient taking differently: Take 25 mg by mouth daily.  12/08/17  Yes [name], MD  sulfaSALAzine (AZULFIDINE) 500 MG EC tablet Take 1,500 mg by mouth daily.  04/23/17  Yes [provider]  sildenafil (VIAGRA) 50 MG tablet 1-2 tabs as needed for erectile dysfunction Patient not taking: Reported on 12/24/2017 05/05/17   Midge Minium, MD     Family History  Problem Relation Age of Onset  . Heart disease Mother   . Emphysema Father     Social History   Socioeconomic History  .  Marital status: Married    Spouse name: Not on file  . Number of children: Not on file  . Years of education: Not on file  . Highest education level: Not on file  Occupational History  . Not on file  Social Needs  . Financial resource strain: Not on file  . Food insecurity:    Worry: Not on file    Inability: Not on file  . Transportation needs:    Medical: Not on file    Non-medical: Not on file  Tobacco Use  . Smoking status: Never Smoker  . Smokeless tobacco: Never Used  . Tobacco comment: states his father died of emphysema from smoking, he did not want to be like that  Substance and  Sexual Activity  . Alcohol use: No  . Drug use: No  . Sexual activity: Yes  Lifestyle  . Physical activity:    Days per week: Not on file    Minutes per session: Not on file  . Stress: Not on file  Relationships  . Social connections:    Talks on phone: Not on file    Gets together: Not on file    Attends religious service: Not on file    Active member of club or organization: Not on file    Attends meetings of clubs or organizations: Not on file    Relationship status: Not on file  Other Topics Concern  . Not on file  Social History Narrative  . Not on file     Review of Systems: A 12 point ROS discussed and pertinent positives are indicated in the HPI above.  All other systems are negative.  Review of Systems  Constitutional: Negative for chills and fever.  Respiratory: Negative for shortness of breath and wheezing.   Cardiovascular: Negative for chest pain and palpitations.  Gastrointestinal: Positive for abdominal pain.  Neurological: Negative for dizziness and headaches.  Psychiatric/Behavioral: Negative for behavioral problems and confusion.    Vital Signs: BP (!) 136/56 (BP Location: Right Arm)   Pulse 86   Temp 98.2 F (36.8 C) (Oral)   Resp 16   Ht 5' 10"  (1.778 m)   Wt 171 lb (77.6 kg)   SpO2 99%   BMI 24.54 kg/m   Physical Exam Vitals signs and nursing note reviewed.  Constitutional:      General: He is not in acute distress.    Appearance: Normal appearance.  Cardiovascular:     Rate and Rhythm: Normal rate and regular rhythm.     Heart sounds: Normal heart sounds. No murmur.  Pulmonary:     Effort: Pulmonary effort is normal. No respiratory distress.     Breath sounds: Normal breath sounds. No wheezing.  Abdominal:     Palpations: Abdomen is soft.     Tenderness: There is abdominal tenderness. There is no guarding.  Skin:    General: Skin is warm and dry.  Neurological:     Mental Status: He is alert and oriented to person, place, and time.   Psychiatric:        Mood and Affect: Mood normal.        Behavior: Behavior normal.        Thought Content: Thought content normal.        Judgment: Judgment normal.      MD Evaluation Airway: WNL Heart: WNL Abdomen: WNL Chest/ Lungs: WNL ASA  Classification: 3 Mallampati/Airway Score: Two   Imaging: Ct Chest W Contrast  Result Date: 12/24/2017 CLINICAL  DATA:  Abdominal pain and fever. Abscess is suspected. Lethargy and anorexia. Recent diagnosis of colon cancer. EXAM: CT CHEST, ABDOMEN, AND PELVIS WITH CONTRAST TECHNIQUE: Multidetector CT imaging of the chest, abdomen and pelvis was performed following the standard protocol during bolus administration of intravenous contrast. CONTRAST:  153m OMNIPAQUE IOHEXOL 300 MG/ML  SOLN COMPARISON:  Abdomen and pelvis CT 09/29/2017.  Chest CT 06/28/2017. FINDINGS: CT CHEST FINDINGS Cardiovascular: The heart size is normal. No substantial pericardial effusion. Coronary artery calcification is evident. Atherosclerotic calcification is noted in the wall of the thoracic aorta. Mediastinum/Nodes: No mediastinal lymphadenopathy. There is no hilar lymphadenopathy. The esophagus has normal imaging features. There is no axillary lymphadenopathy. Lungs/Pleura: The central tracheobronchial airways are patent. 7 mm right middle lobe nodule is stable in the interval. Dependent atelectasis noted in the lower lobes bilaterally. No edema or pleural effusion. Musculoskeletal: No worrisome lytic or sclerotic osseous abnormality. CT ABDOMEN PELVIS FINDINGS Hepatobiliary: No focal abnormality within the liver parenchyma. Gallbladder surgically absent. No intrahepatic or extrahepatic biliary dilation. Pancreas: Pancreas is atrophic. Surgical clips are seen in the region of the pancreatic tail. No dilatation of the main pancreatic duct. Spleen: Abnormal soft tissue again noted in the splenic hilum, substantially progressed since 09/29/2017. There is altered perfusion to the  spleen, potentially related to sites of infiltrative disease or infarct. Adrenals/Urinary Tract: No adrenal nodule or mass. 7 mm nonobstructing stone identified lower pole right kidney. Left kidney unremarkable. No evidence for hydroureter. The urinary bladder appears normal for the degree of distention. Stomach/Bowel: Stomach is markedly distorted due to a heterogeneous complex left upper lobe process (see below). Duodenum is normally positioned as is the ligament of Treitz. Duodenal diverticuli evident. No small bowel wall thickening. No small bowel dilatation. The terminal ileum is normal. The appendix is normal. Anastomotic suture line noted in the transverse colon with associated circumferential wall thickening. The transverse colon in the region the anastomosis is incorporated into the complex left upper quadrant process (see below). Vascular/Lymphatic: There is abdominal aortic atherosclerosis without aneurysm. There is no gastrohepatic or hepatoduodenal ligament lymphadenopathy. No intraperitoneal or retroperitoneal lymphadenopathy. No pelvic sidewall lymphadenopathy. Reproductive: The prostate gland and seminal vesicles have normal imaging features. Other: Complex process in the left upper quadrant involves the stomach, spleen, tail of pancreas, and distal transverse colon in the region of the anastomosis. On the previous study 3 months ago, there was only a 4.7 x 4.7 cm irregular soft tissue mass tethered between the stomach and the splenic hilum. This did track inferiorly and medially towards the tail of pancreas. On today's exam, there is a multi loculated, multicystic process in the left upper quadrant. This includes 2 separate rim enhancing collections in or on the wall of the greater curvature. 1 of these measures 3.7 x 4.7 cm (52/3) and the other more proximal lesion measures 2.4 x 3.6 cm. This latter collection appears to track into an irregular rim enhancing 4.4 x 3.3 cm collection dissecting into  the splenic hilum. There is edema and inflammation in this region with fluid tracking throughout the left upper quadrant and around the spleen. Again this process tethers the proximal stomach, transverse colon, spleen, and pancreatic tail. Musculoskeletal: No worrisome lytic or sclerotic osseous abnormality. IMPRESSION: Interval development of a complex multicystic process in the left upper quadrant of the abdomen. There are multiple rim enhancing complex fluid collections ranging in size from about 2 mm up to 5 mm. Two of these collections are along the greater curvature of  the stomach and the more distal of the two may be intramural. The process dissects/insinuates into the splenic hilum and probably infiltrates the splenic parenchyma although compromise of the splenic vascular anatomy in the hilum with subsequent splenic infarct would also be a consideration. The distal transverse colon at the level of the anastomosis is retracted into this process. Imaging features likely reflect multifocal abscess. Compromise of the tethered colon as etiology for these changes would be a consideration. Given the appearance today, cystic metastatic disease is considered less likely given the marked interval change, although this possibility cannot be excluded. Electronically Signed   By: Misty Stanley M.D.   On: 12/24/2017 20:10   Ct Abdomen Pelvis W Contrast  Result Date: 12/24/2017 CLINICAL DATA:  Abdominal pain and fever. Abscess is suspected. Lethargy and anorexia. Recent diagnosis of colon cancer. EXAM: CT CHEST, ABDOMEN, AND PELVIS WITH CONTRAST TECHNIQUE: Multidetector CT imaging of the chest, abdomen and pelvis was performed following the standard protocol during bolus administration of intravenous contrast. CONTRAST:  152m OMNIPAQUE IOHEXOL 300 MG/ML  SOLN COMPARISON:  Abdomen and pelvis CT 09/29/2017.  Chest CT 06/28/2017. FINDINGS: CT CHEST FINDINGS Cardiovascular: The heart size is normal. No substantial  pericardial effusion. Coronary artery calcification is evident. Atherosclerotic calcification is noted in the wall of the thoracic aorta. Mediastinum/Nodes: No mediastinal lymphadenopathy. There is no hilar lymphadenopathy. The esophagus has normal imaging features. There is no axillary lymphadenopathy. Lungs/Pleura: The central tracheobronchial airways are patent. 7 mm right middle lobe nodule is stable in the interval. Dependent atelectasis noted in the lower lobes bilaterally. No edema or pleural effusion. Musculoskeletal: No worrisome lytic or sclerotic osseous abnormality. CT ABDOMEN PELVIS FINDINGS Hepatobiliary: No focal abnormality within the liver parenchyma. Gallbladder surgically absent. No intrahepatic or extrahepatic biliary dilation. Pancreas: Pancreas is atrophic. Surgical clips are seen in the region of the pancreatic tail. No dilatation of the main pancreatic duct. Spleen: Abnormal soft tissue again noted in the splenic hilum, substantially progressed since 09/29/2017. There is altered perfusion to the spleen, potentially related to sites of infiltrative disease or infarct. Adrenals/Urinary Tract: No adrenal nodule or mass. 7 mm nonobstructing stone identified lower pole right kidney. Left kidney unremarkable. No evidence for hydroureter. The urinary bladder appears normal for the degree of distention. Stomach/Bowel: Stomach is markedly distorted due to a heterogeneous complex left upper lobe process (see below). Duodenum is normally positioned as is the ligament of Treitz. Duodenal diverticuli evident. No small bowel wall thickening. No small bowel dilatation. The terminal ileum is normal. The appendix is normal. Anastomotic suture line noted in the transverse colon with associated circumferential wall thickening. The transverse colon in the region the anastomosis is incorporated into the complex left upper quadrant process (see below). Vascular/Lymphatic: There is abdominal aortic atherosclerosis  without aneurysm. There is no gastrohepatic or hepatoduodenal ligament lymphadenopathy. No intraperitoneal or retroperitoneal lymphadenopathy. No pelvic sidewall lymphadenopathy. Reproductive: The prostate gland and seminal vesicles have normal imaging features. Other: Complex process in the left upper quadrant involves the stomach, spleen, tail of pancreas, and distal transverse colon in the region of the anastomosis. On the previous study 3 months ago, there was only a 4.7 x 4.7 cm irregular soft tissue mass tethered between the stomach and the splenic hilum. This did track inferiorly and medially towards the tail of pancreas. On today's exam, there is a multi loculated, multicystic process in the left upper quadrant. This includes 2 separate rim enhancing collections in or on the wall of the greater curvature.  1 of these measures 3.7 x 4.7 cm (52/3) and the other more proximal lesion measures 2.4 x 3.6 cm. This latter collection appears to track into an irregular rim enhancing 4.4 x 3.3 cm collection dissecting into the splenic hilum. There is edema and inflammation in this region with fluid tracking throughout the left upper quadrant and around the spleen. Again this process tethers the proximal stomach, transverse colon, spleen, and pancreatic tail. Musculoskeletal: No worrisome lytic or sclerotic osseous abnormality. IMPRESSION: Interval development of a complex multicystic process in the left upper quadrant of the abdomen. There are multiple rim enhancing complex fluid collections ranging in size from about 2 mm up to 5 mm. Two of these collections are along the greater curvature of the stomach and the more distal of the two may be intramural. The process dissects/insinuates into the splenic hilum and probably infiltrates the splenic parenchyma although compromise of the splenic vascular anatomy in the hilum with subsequent splenic infarct would also be a consideration. The distal transverse colon at the  level of the anastomosis is retracted into this process. Imaging features likely reflect multifocal abscess. Compromise of the tethered colon as etiology for these changes would be a consideration. Given the appearance today, cystic metastatic disease is considered less likely given the marked interval change, although this possibility cannot be excluded. Electronically Signed   By: Misty Stanley M.D.   On: 12/24/2017 20:10   Dg Chest Port 1 View  Result Date: 12/24/2017 CLINICAL DATA:  Fever, malaise and weakness. EXAM: PORTABLE CHEST 1 VIEW COMPARISON:  Chest CT dated 06/28/2017. FINDINGS: Poor inspiration. Borderline enlarged heart. Tortuous aorta. Mild left basilar predominantly linear density. Minimal diffuse peribronchial thickening and accentuation of the interstitial markings. Thoracic spine degenerative changes. IMPRESSION: 1. Poor inspiration with mild left basilar atelectasis. Underlying pneumonia could not be absolutely excluded. 2. Minimal bronchitic changes. Electronically Signed   By: Claudie Revering M.D.   On: 12/24/2017 17:17    Labs:  CBC: Recent Labs    09/20/17 1032 10/19/17 0720 12/24/17 1642 12/25/17 0238  WBC 11.1* 15.9* 25.2* 20.3*  HGB 12.2* 12.7* 11.8* 9.6*  HCT 39.4 42.2 37.2* 30.4*  PLT 377 454* 493* 402*    COAGS: Recent Labs    10/19/17 0720 12/24/17 1642  INR 1.08 3.45    BMP: Recent Labs    09/20/17 1032 10/19/17 0720 12/24/17 1642 12/25/17 0238  NA 138 141 131* 133*  K 3.8 3.9 3.8 3.9  CL 103 104 94* 101  CO2 25 23 22  19*  GLUCOSE 262* 158* 162* 161*  BUN 13 16 12 10   CALCIUM 9.6 9.6 8.7* 7.5*  CREATININE 1.25* 1.20 1.29* 1.21  GFRNONAA 56* 59* 55* 59*  GFRAA >60 >60 >60 >60    LIVER FUNCTION TESTS: Recent Labs    09/20/17 1032 10/19/17 0720 12/24/17 1642 12/25/17 0238  BILITOT 0.3 0.6 1.0 0.7  AST 15 26 15  11*  ALT 10 16 13 9   ALKPHOS 66 60 75 63  PROT 8.0 8.6* 7.8 5.9*  ALBUMIN 3.7 4.2 3.0* 2.1*    TUMOR MARKERS: No  results for input(s): AFPTM, CEA, CA199, CHROMGRNA in the last 8760 hours.  Assessment and Plan:  Intraabdominal abscess. Plan for image-guided percutaneous intraabdominal abscess drain placement tentivley for 12/26/2017 pending INR. Patient will be NPO at midnight prior to procedure. Patient is currently taking Xarelto 20 mg once daily, last dose 0832 this AM- per Dr. Kathlene Cote hold Xarelto at this time, Dr. Tawanna Solo aware. INR  3.45 seconds 12/24/2017- will recheck tomorrow at 0500, must be below 1.50 seconds prior to proceeding with procedure.  Risks and benefits discussed with the patient including bleeding, infection, damage to adjacent structures, bowel perforation/fistula connection, and sepsis. All of the patient's questions were answered, patient is agreeable to proceed. Consent signed and in chart.   Thank you for this interesting consult.  I greatly enjoyed meeting EPIMENIO SCHETTER and look forward to participating in their care.  A copy of this report was sent to the requesting provider on this date.  Electronically Signed: Earley Abide, PA-C 12/25/2017, 10:03 AM   I spent a total of 40 Minutes in face to face in clinical consultation, greater than 50% of which was counseling/coordinating care for intraabdominal abscess.

## 2017-12-25 NOTE — Progress Notes (Signed)
Patient ID: Cody Suarez, male   DOB: 1945-09-06, 72 y.o.   MRN: [id number]   Acute Care Surgery Service Progress Note:    Chief Complaint/Subjective: Reports chills along with n/v  Objective: Vital signs in last 24 hours: Temp:  [98.2 F (36.8 C)-102.8 F (39.3 C)] 98.2 F (36.8 C) (12/21 0837) Pulse Rate:  [59-120] 86 (12/21 0837) Resp:  [12-30] 16 (12/21 0837) BP: (110-148)/(51-65) 136/56 (12/21 0837) SpO2:  [94 %-100 %] 99 % (12/21 0837) Weight:  [77.1 kg-77.6 kg] 77.6 kg (12/20 2234) Last BM Date: 12/24/17  Intake/Output from previous day: 12/20 0701 - 12/21 0700 In: 3106.7 [I.V.:2906.7; IV Piggyback:200] Out: -  Intake/Output this shift: No intake/output data recorded.  Ill appearing  Lungs: cta, nonlabored  Cardiovascular: reg  Abd: soft, not really  Extremities: no edema, +SCDs  Neuro: alert, nonfocal  Lab Results: CBC  Recent Labs    12/24/17 1642 12/25/17 0238  WBC 25.2* 20.3*  HGB 11.8* 9.6*  HCT 37.2* 30.4*  PLT 493* 402*   BMET Recent Labs    12/24/17 1642 12/25/17 0238  NA 131* 133*  K 3.8 3.9  CL 94* 101  CO2 22 19*  GLUCOSE 162* 161*  BUN 12 10  CREATININE 1.29* 1.21  CALCIUM 8.7* 7.5*   LFT Hepatic Function Latest Ref Rng & Units 12/25/2017 12/24/2017 10/19/2017  Total Protein 6.5 - 8.1 g/dL 5.9(L) 7.8 8.6(H)  Albumin 3.5 - 5.0 g/dL 2.1(L) 3.0(L) 4.2  AST 15 - 41 U/L 11(L) 15 26  ALT 0 - 44 U/L 9 13 16   Alk Phosphatase 38 - 126 U/L 63 75 60  Total Bilirubin 0.3 - 1.2 mg/dL 0.7 1.0 0.6  Bilirubin, Direct 0.00 - 0.40 mg/dL - - -   PT/INR Recent Labs    12/24/17 1642  LABPROT 34.2*  INR 3.45   ABG No results for input(s): PHART, HCO3 in the last 72 hours.  Invalid input(s): PCO2, PO2  Studies/Results:  Anti-infectives: Anti-infectives (From admission, onward)   Start     Dose/Rate Route Frequency Ordered Stop   12/24/17 2300  cefTRIAXone (ROCEPHIN) 1 g in sodium chloride 0.9 % 100 mL IVPB  Status:  Discontinued      1 g 200 mL/hr over 30 Minutes Intravenous Every 24 hours 12/24/17 2247 12/24/17 2252   12/24/17 2300  metroNIDAZOLE (FLAGYL) IVPB 500 mg  Status:  Discontinued     500 mg 100 mL/hr over 60 Minutes Intravenous Every 8 hours 12/24/17 2247 12/24/17 2252   12/24/17 2300  metroNIDAZOLE (FLAGYL) IVPB 500 mg     500 mg 100 mL/hr over 60 Minutes Intravenous Every 8 hours 12/24/17 2233     12/24/17 2030  cefTRIAXone (ROCEPHIN) 2 g in sodium chloride 0.9 % 100 mL IVPB     2 g 200 mL/hr over 30 Minutes Intravenous Every 24 hours 12/24/17 2017     12/24/17 2030  metroNIDAZOLE (FLAGYL) IVPB 500 mg  Status:  Discontinued     500 mg 100 mL/hr over 60 Minutes Intravenous Every 8 hours 12/24/17 2017 12/24/17 2233      Medications: Scheduled Meds: . colestipol  1 g Oral BID  . megestrol  40 mg Oral Daily  . multivitamin with minerals  1 tablet Oral Daily  . sulfaSALAzine  1,500 mg Oral BID WC   Continuous Infusions: . sodium chloride 75 mL/hr at 12/25/17 0634  . cefTRIAXone (ROCEPHIN)  IV Stopped (12/24/17 2204)  . metronidazole 500 mg (12/25/17 0543)   PRN  Meds:.acetaminophen, diphenhydrAMINE, ondansetron **OR** ondansetron (ZOFRAN) IV  Assessment/Plan: Patient Active Problem List   Diagnosis Date Noted  . Sepsis, Gram negative ([place]) 12/24/2017  . Intra-abdominal abscess ([place]) 12/24/2017  . Hyperlipidemia 11/12/2017  . Erectile dysfunction 05/05/2017  . 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 History of kidney stones   . PAD (peripheral artery disease) ([place]) 01/22/2015  . Anemia of chronic disease 09/06/2014  . 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 Atherosclerotic PVD with ulceration ([place]) 11/14/2013  . Atherosclerotic PVD with intermittent claudication ([place]) 10/31/2013  . 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 Hypertension   . Arterial thromboembolism ([place]) 08/09/2013  . 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 Right inguinal hernia 03/23/2013  . Umbilical hernia 92/78/0044  . Ulcerative colitis ([place]) 03/23/2013   Metastatic colon cancer with possible intra-abdominal abscesses versus extension of the disease involving multiple LUQ organs  Cont iv abx Not a surgical candidate Agree with IR evaluation  Medical oncology consult/weigh in    LOS: 1 day    [name]. Redmond Pulling, MD, FACS General, Bariatric, & Minimally Invasive Surgery [phone] [facility]

## 2017-12-25 NOTE — Progress Notes (Signed)
PROGRESS NOTE    Cody Suarez  IWL:[id number] DOB: [birth year] DOA: 12/24/2017 PCP: Midge Minium, MD   Brief Narrative: Patient is a 72 year old male with past medical history of metastatic adenocarcinoma of the colon with possible recurrence, history of DVT, hypertension, peripheral vascular disease, ulcerative colitis, anemia of chronic disease who presents with progressive weakness and abdominal pain from home.  He also complained of decreased appetite.  On arrival he was found to be febrile and tachycardic.  Also found to have leukocytosis.  CT imaging showed possible intra-abdominal abscess.  Surgery and IR consulted.  Plan is to do IR guided drainage.  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  Presented with leukocytosis, lactic acidosis.  Continue IV fluids and antibiotics.  On ceftriaxone and Flagyl.  Follow blood cultures.    Mild grade fever this morning.  Surgery consulted but patient denies surgical intervention.  IR consulted today.  Plan for IR guided drainage with culture and biopsy after his INR is optimal.  Intra-abdominal abscess: CT scan showed multifocal abdominal abscess. Read RD:[id number] multicystic process in the left upper quadrant of the abdomen. There are multiple rim enhancing complex fluid collections ranging in size from about 2 mm up to 5 mm. Two of these collections are along the greater curvature of the stomach and the more distal of the two may be intramural There is a possibility that this could be cystic metastatic  masses but is less likely.  Possible pneumonia: Chest x-ray showed possible left lower lobe pneumonia versus  atelectasis.  Already on antibiotics.  Respiratory status stable  Metastatic adenocarcinoma the colon: Initially diagnosed in 2015.  Had recurrence in 2017.  Status post chemotherapy.  Follows with Dr. Learta Codding.  I will contact Dr. Learta Codding about his presence in the hospital and further recommendation.  Anemia of chronic disease: Continue to monitor H&H.  Currently stable.  Anorexia: Associated with malignancy. Continue Megace.  History of DVT: On Xarelto.  Which will be on hold.INR was  In the range of 3 on presentation.  Continue monitoring INR  History of ulcerative colitis: On sulfasalazine           DVT prophylaxis:SCD Code Status: Full Family Communication: Discussed with wife at the bedside Disposition Plan: Undetermined at this point.  Needs further work-up   Consultants: IR, surgery  Procedures: None  Antimicrobials: Ceftriaxone and Flagyl day 2  Subjective: Patient seen and examined at the bedside this morning.  Mildly febrile this morning.  Otherwise hemodynamically stable.  Denies any abdominal pain, nausea, vomiting or diarrhea.  Respiratory status stable.  Objective: Vitals:   12/25/17 0013 12/25/17 0435 12/25/17 0544 12/25/17 0837  BP: 135/61 (!) 126/57  (!) 136/56  Pulse: 96 92  86  Resp: (!) 21 (!) 21  16  Temp: 99.8 F (37.7 C) (!) 102.8 F (39.3 C) 100 F (37.8 C) 98.2 F (36.8 C)  TempSrc: Oral Oral Oral Oral  SpO2: 96% 96%  99%  Weight:      Height:        Intake/Output Summary (Last 24 hours) at 12/25/2017 0922 Last data filed at 12/25/2017 0634 Gross per 24 hour  Intake 3106.65 ml  Output -  Net 3106.65  ml   Filed Weights   12/24/17 1639 12/24/17 2234  Weight: 77.1 kg 77.6 kg    Examination:  General exam: Not in distress,average built HEENT:PERRL,Oral mucosa moist, Ear/Nose normal on gross exam Respiratory system: Bilateral equal air entry, normal vesicular breath sounds, no wheezes or crackles  Cardiovascular system: S1 & S2  heard, RRR. No JVD, murmurs, rubs, gallops or clicks. No pedal edema. Gastrointestinal system: Abdomen is mildy distended, soft and nontender. No organomegaly or masses felt. Normal bowel sounds heard. Central nervous system: Alert and oriented. No focal neurological deficits. Extremities: No edema, no clubbing ,no cyanosis, distal peripheral pulses palpable. Skin: No rashes, lesions or ulcers,no icterus ,no pallor MSK: Normal muscle bulk,tone ,power Psychiatry: Judgement and insight appear normal. Mood & affect appropriate.     Data Reviewed: I have personally reviewed following labs and imaging studies  CBC: Recent Labs  Lab 12/24/17 1642 12/25/17 0238  WBC 25.2* 20.3*  NEUTROABS 20.4*  --   HGB 11.8* 9.6*  HCT 37.2* 30.4*  MCV 80.2 79.8*  PLT 493* 536*   Basic Metabolic Panel: Recent Labs  Lab 12/24/17 1642 12/25/17 0238  NA 131* 133*  K 3.8 3.9  CL 94* 101  CO2 22 19*  GLUCOSE 162* 161*  BUN 12 10  CREATININE 1.29* 1.21  CALCIUM 8.7* 7.5*   GFR: Estimated Creatinine Clearance: 56.1 mL/min (by C-G formula based on SCr of 1.21 mg/dL). Liver Function Tests: Recent Labs  Lab 12/24/17 1642 12/25/17 0238  AST 15 11*  ALT 13 9  ALKPHOS 75 63  BILITOT 1.0 0.7  PROT 7.8 5.9*  ALBUMIN 3.0* 2.1*   No results for input(s): LIPASE, AMYLASE in the last 168 hours. No results for input(s): AMMONIA in the last 168 hours. Coagulation Profile: Recent Labs  Lab 12/24/17 1642  INR 3.45   Cardiac Enzymes: No results for input(s): CKTOTAL, CKMB, CKMBINDEX, TROPONINI in the last 168 hours. BNP (last 3 results) No results for input(s): PROBNP in the last 8760 hours. HbA1C: No results for input(s): HGBA1C in the last 72 hours. CBG: No results for input(s): GLUCAP in the last 168 hours. Lipid Profile: No results for input(s): CHOL, HDL, LDLCALC, TRIG, CHOLHDL, LDLDIRECT in the last 72 hours. Thyroid Function Tests: No results for input(s): TSH, T4TOTAL, FREET4, T3FREE,  THYROIDAB in the last 72 hours. Anemia Panel: No results for input(s): VITAMINB12, FOLATE, FERRITIN, TIBC, IRON, RETICCTPCT in the last 72 hours. Sepsis Labs: Recent Labs  Lab 12/24/17 1705  LATICACIDVEN 1.93*    Recent Results (from the past 240 hour(s))  Blood Culture (routine x 2)     Status: None (Preliminary result)   Collection Time: 12/24/17  4:53 PM  Result Value Ref Range Status   Specimen Description BLOOD LEFT ANTECUBITAL  Final   Special Requests   Final    BOTTLES DRAWN AEROBIC AND ANAEROBIC Blood Culture adequate volume   Culture   Final    NO GROWTH < 24 HOURS Performed at [facility], [place] [address], [place],  [address]    Report Status PENDING  Incomplete  Blood Culture (routine x 2)     Status: None (Preliminary result)   Collection Time: 12/24/17  5:20 PM  Result Value Ref Range Status   Specimen Description BLOOD LEFT FOREARM  Final   Special Requests   Final    BOTTLES DRAWN AEROBIC AND ANAEROBIC Blood Culture adequate volume   Culture   Final    NO GROWTH < 24  HOURS Performed at [facility], [place] [address], [place], [place] [address]    Report Status PENDING  Incomplete         Radiology Studies: Ct Chest W Contrast  Result Date: 12/24/2017 CLINICAL DATA:  Abdominal pain and fever. Abscess is suspected. Lethargy and anorexia. Recent diagnosis of colon cancer. EXAM: CT CHEST, ABDOMEN, AND PELVIS WITH CONTRAST TECHNIQUE: Multidetector CT imaging of the chest, abdomen and pelvis was performed following the standard protocol during bolus administration of intravenous contrast. CONTRAST:  170m OMNIPAQUE IOHEXOL 300 MG/ML  SOLN COMPARISON:  Abdomen and pelvis CT 09/29/2017.  Chest CT 06/28/2017. FINDINGS: CT CHEST FINDINGS Cardiovascular: The heart size is normal. No substantial pericardial effusion. Coronary artery calcification is evident. Atherosclerotic calcification is noted in the wall of the thoracic aorta. Mediastinum/Nodes: No  mediastinal lymphadenopathy. There is no hilar lymphadenopathy. The esophagus has normal imaging features. There is no axillary lymphadenopathy. Lungs/Pleura: The central tracheobronchial airways are patent. 7 mm right middle lobe nodule is stable in the interval. Dependent atelectasis noted in the lower lobes bilaterally. No edema or pleural effusion. Musculoskeletal: No worrisome lytic or sclerotic osseous abnormality. CT ABDOMEN PELVIS FINDINGS Hepatobiliary: No focal abnormality within the liver parenchyma. Gallbladder surgically absent. No intrahepatic or extrahepatic biliary dilation. Pancreas: Pancreas is atrophic. Surgical clips are seen in the region of the pancreatic tail. No dilatation of the main pancreatic duct. Spleen: Abnormal soft tissue again noted in the splenic hilum, substantially progressed since 09/29/2017. There is altered perfusion to the spleen, potentially related to sites of infiltrative disease or infarct. Adrenals/Urinary Tract: No adrenal nodule or mass. 7 mm nonobstructing stone identified lower pole right kidney. Left kidney unremarkable. No evidence for hydroureter. The urinary bladder appears normal for the degree of distention. Stomach/Bowel: Stomach is markedly distorted due to a heterogeneous complex left upper lobe process (see below). Duodenum is normally positioned as is the ligament of Treitz. Duodenal diverticuli evident. No small bowel wall thickening. No small bowel dilatation. The terminal ileum is normal. The appendix is normal. Anastomotic suture line noted in the transverse colon with associated circumferential wall thickening. The transverse colon in the region the anastomosis is incorporated into the complex left upper quadrant process (see below). Vascular/Lymphatic: There is abdominal aortic atherosclerosis without aneurysm. There is no gastrohepatic or hepatoduodenal ligament lymphadenopathy. No intraperitoneal or retroperitoneal lymphadenopathy. No pelvic  sidewall lymphadenopathy. Reproductive: The prostate gland and seminal vesicles have normal imaging features. Other: Complex process in the left upper quadrant involves the stomach, spleen, tail of pancreas, and distal transverse colon in the region of the anastomosis. On the previous study 3 months ago, there was only a 4.7 x 4.7 cm irregular soft tissue mass tethered between the stomach and the splenic hilum. This did track inferiorly and medially towards the tail of pancreas. On today's exam, there is a multi loculated, multicystic process in the left upper quadrant. This includes 2 separate rim enhancing collections in or on the wall of the greater curvature. 1 of these measures 3.7 x 4.7 cm (52/3) and the other more proximal lesion measures 2.4 x 3.6 cm. This latter collection appears to track into an irregular rim enhancing 4.4 x 3.3 cm collection dissecting into the splenic hilum. There is edema and inflammation in this region with fluid tracking throughout the left upper quadrant and around the spleen. Again this process tethers the proximal stomach, transverse colon, spleen, and pancreatic tail. Musculoskeletal: No worrisome lytic or sclerotic osseous abnormality. IMPRESSION: Interval development of  a complex multicystic process in the left upper quadrant of the abdomen. There are multiple rim enhancing complex fluid collections ranging in size from about 2 mm up to 5 mm. Two of these collections are along the greater curvature of the stomach and the more distal of the two may be intramural. The process dissects/insinuates into the splenic hilum and probably infiltrates the splenic parenchyma although compromise of the splenic vascular anatomy in the hilum with subsequent splenic infarct would also be a consideration. The distal transverse colon at the level of the anastomosis is retracted into this process. Imaging features likely reflect multifocal abscess. Compromise of the tethered colon as etiology for  these changes would be a consideration. Given the appearance today, cystic metastatic disease is considered less likely given the marked interval change, although this possibility cannot be excluded. Electronically Signed   By: Misty Stanley M.D.   On: 12/24/2017 20:10   Ct Abdomen Pelvis W Contrast  Result Date: 12/24/2017 CLINICAL DATA:  Abdominal pain and fever. Abscess is suspected. Lethargy and anorexia. Recent diagnosis of colon cancer. EXAM: CT CHEST, ABDOMEN, AND PELVIS WITH CONTRAST TECHNIQUE: Multidetector CT imaging of the chest, abdomen and pelvis was performed following the standard protocol during bolus administration of intravenous contrast. CONTRAST:  124m OMNIPAQUE IOHEXOL 300 MG/ML  SOLN COMPARISON:  Abdomen and pelvis CT 09/29/2017.  Chest CT 06/28/2017. FINDINGS: CT CHEST FINDINGS Cardiovascular: The heart size is normal. No substantial pericardial effusion. Coronary artery calcification is evident. Atherosclerotic calcification is noted in the wall of the thoracic aorta. Mediastinum/Nodes: No mediastinal lymphadenopathy. There is no hilar lymphadenopathy. The esophagus has normal imaging features. There is no axillary lymphadenopathy. Lungs/Pleura: The central tracheobronchial airways are patent. 7 mm right middle lobe nodule is stable in the interval. Dependent atelectasis noted in the lower lobes bilaterally. No edema or pleural effusion. Musculoskeletal: No worrisome lytic or sclerotic osseous abnormality. CT ABDOMEN PELVIS FINDINGS Hepatobiliary: No focal abnormality within the liver parenchyma. Gallbladder surgically absent. No intrahepatic or extrahepatic biliary dilation. Pancreas: Pancreas is atrophic. Surgical clips are seen in the region of the pancreatic tail. No dilatation of the main pancreatic duct. Spleen: Abnormal soft tissue again noted in the splenic hilum, substantially progressed since 09/29/2017. There is altered perfusion to the spleen, potentially related to sites of  infiltrative disease or infarct. Adrenals/Urinary Tract: No adrenal nodule or mass. 7 mm nonobstructing stone identified lower pole right kidney. Left kidney unremarkable. No evidence for hydroureter. The urinary bladder appears normal for the degree of distention. Stomach/Bowel: Stomach is markedly distorted due to a heterogeneous complex left upper lobe process (see below). Duodenum is normally positioned as is the ligament of Treitz. Duodenal diverticuli evident. No small bowel wall thickening. No small bowel dilatation. The terminal ileum is normal. The appendix is normal. Anastomotic suture line noted in the transverse colon with associated circumferential wall thickening. The transverse colon in the region the anastomosis is incorporated into the complex left upper quadrant process (see below). Vascular/Lymphatic: There is abdominal aortic atherosclerosis without aneurysm. There is no gastrohepatic or hepatoduodenal ligament lymphadenopathy. No intraperitoneal or retroperitoneal lymphadenopathy. No pelvic sidewall lymphadenopathy. Reproductive: The prostate gland and seminal vesicles have normal imaging features. Other: Complex process in the left upper quadrant involves the stomach, spleen, tail of pancreas, and distal transverse colon in the region of the anastomosis. On the previous study 3 months ago, there was only a 4.7 x 4.7 cm irregular soft tissue mass tethered between the stomach and the splenic hilum. This  did track inferiorly and medially towards the tail of pancreas. On today's exam, there is a multi loculated, multicystic process in the left upper quadrant. This includes 2 separate rim enhancing collections in or on the wall of the greater curvature. 1 of these measures 3.7 x 4.7 cm (52/3) and the other more proximal lesion measures 2.4 x 3.6 cm. This latter collection appears to track into an irregular rim enhancing 4.4 x 3.3 cm collection dissecting into the splenic hilum. There is edema and  inflammation in this region with fluid tracking throughout the left upper quadrant and around the spleen. Again this process tethers the proximal stomach, transverse colon, spleen, and pancreatic tail. Musculoskeletal: No worrisome lytic or sclerotic osseous abnormality. IMPRESSION: Interval development of a complex multicystic process in the left upper quadrant of the abdomen. There are multiple rim enhancing complex fluid collections ranging in size from about 2 mm up to 5 mm. Two of these collections are along the greater curvature of the stomach and the more distal of the two may be intramural. The process dissects/insinuates into the splenic hilum and probably infiltrates the splenic parenchyma although compromise of the splenic vascular anatomy in the hilum with subsequent splenic infarct would also be a consideration. The distal transverse colon at the level of the anastomosis is retracted into this process. Imaging features likely reflect multifocal abscess. Compromise of the tethered colon as etiology for these changes would be a consideration. Given the appearance today, cystic metastatic disease is considered less likely given the marked interval change, although this possibility cannot be excluded. Electronically Signed   By: Misty Stanley M.D.   On: 12/24/2017 20:10   Dg Chest Port 1 View  Result Date: 12/24/2017 CLINICAL DATA:  Fever, malaise and weakness. EXAM: PORTABLE CHEST 1 VIEW COMPARISON:  Chest CT dated 06/28/2017. FINDINGS: Poor inspiration. Borderline enlarged heart. Tortuous aorta. Mild left basilar predominantly linear density. Minimal diffuse peribronchial thickening and accentuation of the interstitial markings. Thoracic spine degenerative changes. IMPRESSION: 1. Poor inspiration with mild left basilar atelectasis. Underlying pneumonia could not be absolutely excluded. 2. Minimal bronchitic changes. Electronically Signed   By: Claudie Revering M.D.   On: 12/24/2017 17:17         Scheduled Meds: . colestipol  1 g Oral BID  . multivitamin with minerals  1 tablet Oral Daily  . rivaroxaban  20 mg Oral Q breakfast  . spironolactone  25 mg Oral Daily  . sulfaSALAzine  1,500 mg Oral BID WC   Continuous Infusions: . sodium chloride 75 mL/hr at 12/25/17 0634  . cefTRIAXone (ROCEPHIN)  IV Stopped (12/24/17 2204)  . metronidazole 500 mg (12/25/17 0543)     LOS: 1 day    Time spent: 35 mins.More than 50% of that time was spent in counseling and/or coordination of care.      Shelly Coss, MD [facility] Pager [phone]  If 7PM-7AM, please contact night-coverage [email or web address] Password [email or web address] 12/25/2017, 9:22 AM

## 2017-12-26 ENCOUNTER — Encounter (HOSPITAL_COMMUNITY): Payer: Self-pay | Admitting: *Deleted

## 2017-12-26 LAB — BASIC METABOLIC PANEL
Anion gap: 13 (ref 5–15)
BUN: 6 mg/dL — ABNORMAL LOW (ref 8–23)
CO2: 19 mmol/L — ABNORMAL LOW (ref 22–32)
Calcium: 7.6 mg/dL — ABNORMAL LOW (ref 8.9–10.3)
Chloride: 102 mmol/L (ref 98–111)
Creatinine, Ser: 1.23 mg/dL (ref 0.61–1.24)
GFR calc non Af Amer: 58 mL/min — ABNORMAL LOW (ref >60)
Glucose, Bld: 126 mg/dL — ABNORMAL HIGH (ref 70–99)
Potassium: 3.3 mmol/L — ABNORMAL LOW (ref 3.5–5.1)
Sodium: 134 mmol/L — ABNORMAL LOW (ref 135–145)

## 2017-12-26 LAB — PROTIME-INR
INR: 1.72
Prothrombin Time: 19.9 seconds — ABNORMAL HIGH (ref 11.4–15.2)

## 2017-12-26 LAB — CBC WITH DIFFERENTIAL/PLATELET
Abs Immature Granulocytes: 0.18 10*3/uL — ABNORMAL HIGH (ref 0.00–0.07)
Basophils Absolute: 0.1 10*3/uL (ref 0.0–0.1)
Basophils Relative: 1 %
EOS PCT: 1 %
Eosinophils Absolute: 0.2 10*3/uL (ref 0.0–0.5)
HCT: 31.7 % — ABNORMAL LOW (ref 39.0–52.0)
Hemoglobin: 9.9 g/dL — ABNORMAL LOW (ref 13.0–17.0)
Immature Granulocytes: 1 %
Lymphocytes Relative: 6 %
Lymphs Abs: 1.3 10*3/uL (ref 0.7–4.0)
MCH: 24.4 pg — ABNORMAL LOW (ref 26.0–34.0)
MCHC: 31.2 g/dL (ref 30.0–36.0)
MCV: 78.3 fL — ABNORMAL LOW (ref 80.0–100.0)
Monocytes Absolute: 1.9 10*3/uL — ABNORMAL HIGH (ref 0.1–1.0)
Monocytes Relative: 9 %
Neutro Abs: 18.3 10*3/uL — ABNORMAL HIGH (ref 1.7–7.7)
Neutrophils Relative %: 82 %
Platelets: 409 10*3/uL — ABNORMAL HIGH (ref 150–400)
RBC: 4.05 MIL/uL — ABNORMAL LOW (ref 4.22–5.81)
RDW: 19.1 % — ABNORMAL HIGH (ref 11.5–15.5)
WBC: 22 10*3/uL — ABNORMAL HIGH (ref 4.0–10.5)
nRBC: 0 % (ref 0.0–0.2)

## 2017-12-26 MED ORDER — LOPERAMIDE HCL 2 MG PO CAPS
2.0000 mg | ORAL_CAPSULE | ORAL | Status: DC | PRN
  Administered 2017-12-26: 2 mg via ORAL
  Filled 2017-12-26: qty 1

## 2017-12-26 MED ORDER — POTASSIUM CHLORIDE CRYS ER 20 MEQ PO TBCR
40.0000 meq | EXTENDED_RELEASE_TABLET | Freq: Once | ORAL | Status: AC
  Administered 2017-12-26: 40 meq via ORAL
  Filled 2017-12-26: qty 2

## 2017-12-26 MED ORDER — POTASSIUM CHLORIDE 10 MEQ/100ML IV SOLN
10.0000 meq | INTRAVENOUS | Status: AC
  Administered 2017-12-26: 10 meq via INTRAVENOUS
  Filled 2017-12-26 (×3): qty 100

## 2017-12-26 NOTE — Progress Notes (Signed)
Patient was scheduled for an image-guided percutaneous intraabdominal drain placement tentatively for today pending INR.  INR today 1.72 seconds.  Discussed case with Dr. Kathlene Cote.  Saw patient on floor. Spoke to him and his wife. Informed them of INR and how target is 1.50 second sand below. Because of this, procedure will not occur today. Will plan for procedure tomorrow pending INR. Will restart diet and make NPO at midnight. Dr. Tawanna Solo and RN aware.  All questions answered and concerns addressed. Patient conveys understanding and agrees with plan.  Bea Graff Louk, PA-C 12/26/2017, 8:41 AM

## 2017-12-26 NOTE — Progress Notes (Signed)
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  He also complained of decreased appetite.  On arrival he was found to be febrile and tachycardic.  Also found to have leukocytosis.  CT imaging showed possible intra-abdominal abscess.  Surgery and IR consulted.  Started on antibiotics.Plan is to do IR guided drainage tomorrow.  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  Presented with leukocytosis, lactic acidosis.  Continue IV fluids and antibiotics.  On ceftriaxone and Flagyl.  Follow blood cultures.    Mild grade fever this morning.  Surgery consulted but patient denies surgical intervention.  IR consulted today.  Plan for IR guided drainage with culture and biopsy after his INR is optimal.[place] tomorrow.  Intra-abdominal abscess: CT scan showed multifocal abdominal abscess. Read [id number] multicystic process in the left upper quadrant of the abdomen. There are multiple rim enhancing complex fluid collections ranging in size from about 2 mm up to 5 mm. Two of these collections are along the greater curvature of the stomach and the more distal of the two may be intramural There is a possibility that this could be cystic metastatic  masses but is less likely.  Possible  pneumonia: Chest x-ray showed possible left lower lobe pneumonia versus atelectasis.  Already on antibiotics.  Respiratory status stable.  Metastatic adenocarcinoma the colon: Initially diagnosed in 2015.  Had recurrence in 2017.  Status post chemotherapy.  Follows with Dr. Learta Codding.  Tred to contact Dr. Learta Codding about his presence in the hospital and further recommendation but unsuccessful.Probably he will see him tomorrow.  Anemia of chronic disease: Continue to monitor H&H.  Currently stable.  Anorexia: Associated with malignancy. Continue Megace.  History of DVT: On Xarelto.  Which will be on hold.INR was  In the range of 3 on presentation.  Continue monitoring INR.Today in the range of 1.7.  History of ulcerative colitis: On sulfasalazine           DVT prophylaxis:SCD Code Status: Full Family Communication: Discussed with wife at the bedside Disposition Plan: Undetermined at this point.  Needs further work-up   Consultants: IR, surgery  Procedures: None  Antimicrobials: Ceftriaxone and Flagyl day 3  Subjective: Patient seen and examined at the bedside this morning.  Low grade fever  this morning.  Otherwise hemodynamically stable.  Denies any abdominal pain, nausea, vomiting .Had diarrhea.  Respiratory status stable.  Objective: Vitals:   12/25/17 1600 12/25/17 2004 12/25/17 2359 12/26/17 0405  BP:  (!) 130/56 (!) 123/56 (!) 143/57  Pulse: 90 (!) 101 87 97  Resp: (!) 23 (!) 26 (!) 24 (!) 26  Temp:  100 F (37.8 C) 98.3 F (36.8 C) (!) 100.5 F (38.1 C)  TempSrc:  Oral Oral Oral  SpO2: 97% 96% 96% 97%  Weight:      Height:        Intake/Output Summary (Last 24  hours) at 12/26/2017 0950 Last data filed at 12/26/2017 0326 Gross per 24 hour  Intake 1777.13 ml  Output 80 ml  Net 1697.13 ml   Filed Weights   12/24/17 1639 12/24/17 2234  Weight: 77.1 kg 77.6 kg    Examination:  General exam: Not in distress,average built HEENT:PERRL,Oral mucosa moist,  Ear/Nose normal on gross exam Respiratory system: Bilateral equal air entry, normal vesicular breath sounds, no wheezes or crackles  Cardiovascular system: S1 & S2 heard, RRR. No JVD, murmurs, rubs, gallops or clicks. No pedal edema. Gastrointestinal system: Abdomen is mildy distended, soft and nontender. No organomegaly or masses felt. Normal bowel sounds heard. Central nervous system: Alert and oriented. No focal neurological deficits. Extremities: No edema, no clubbing ,no cyanosis, distal peripheral pulses palpable. Skin: No rashes, lesions or ulcers,no icterus ,no pallor MSK: Normal muscle bulk,tone ,power Psychiatry: Judgement and insight appear normal. Mood & affect appropriate.     Data Reviewed: I have personally reviewed following labs and imaging studies  CBC: Recent Labs  Lab 12/24/17 1642 12/25/17 0238 12/26/17 0340  WBC 25.2* 20.3* 22.0*  NEUTROABS 20.4*  --  18.3*  HGB 11.8* 9.6* 9.9*  HCT 37.2* 30.4* 31.7*  MCV 80.2 79.8* 78.3*  PLT 493* 402* 431*   Basic Metabolic Panel: Recent Labs  Lab 12/24/17 1642 12/25/17 0238 12/26/17 0340  NA 131* 133* 134*  K 3.8 3.9 3.3*  CL 94* 101 102  CO2 22 19* 19*  GLUCOSE 162* 161* 126*  BUN 12 10 6*  CREATININE 1.29* 1.21 1.23  CALCIUM 8.7* 7.5* 7.6*   GFR: Estimated Creatinine Clearance: 55.2 mL/min (by C-G formula based on SCr of 1.23 mg/dL). Liver Function Tests: Recent Labs  Lab 12/24/17 1642 12/25/17 0238  AST 15 11*  ALT 13 9  ALKPHOS 75 63  BILITOT 1.0 0.7  PROT 7.8 5.9*  ALBUMIN 3.0* 2.1*   No results for input(s): LIPASE, AMYLASE in the last 168 hours. No results for input(s): AMMONIA in the last 168 hours. Coagulation Profile: Recent Labs  Lab 12/24/17 1642 12/26/17 0340  INR 3.45 1.72   Cardiac Enzymes: No results for input(s): CKTOTAL, CKMB, CKMBINDEX, TROPONINI in the last 168 hours. BNP (last 3 results) No results for input(s): PROBNP in the last 8760 hours. HbA1C: No results for  input(s): HGBA1C in the last 72 hours. CBG: No results for input(s): GLUCAP in the last 168 hours. Lipid Profile: No results for input(s): CHOL, HDL, LDLCALC, TRIG, CHOLHDL, LDLDIRECT in the last 72 hours. Thyroid Function Tests: No results for input(s): TSH, T4TOTAL, FREET4, T3FREE, THYROIDAB in the last 72 hours. Anemia Panel: No results for input(s): VITAMINB12, FOLATE, FERRITIN, TIBC, IRON, RETICCTPCT in the last 72 hours. Sepsis Labs: Recent Labs  Lab 12/24/17 1705  LATICACIDVEN 1.93*    Recent Results (from the past 240 hour(s))  Blood Culture (routine x 2)     Status: None (Preliminary result)   Collection Time: 12/24/17  4:53 PM  Result Value Ref Range Status   Specimen Description BLOOD LEFT ANTECUBITAL  Final   Special Requests   Final    BOTTLES DRAWN AEROBIC AND ANAEROBIC Blood Culture adequate volume   Culture   Final    NO GROWTH 2 DAYS Performed at [facility], [address], [place], [place] [address]    Report Status PENDING  Incomplete  Blood Culture (routine x 2)     Status: None (Preliminary result)   Collection Time: 12/24/17  5:20 PM  Result  Value Ref Range Status   Specimen Description BLOOD LEFT FOREARM  Final   Special Requests   Final    BOTTLES DRAWN AEROBIC AND ANAEROBIC Blood Culture adequate volume   Culture   Final    NO GROWTH 2 DAYS Performed at [facility], [address] [place], [place] [address]    Report Status PENDING  Incomplete         Radiology Studies: Ct Chest W Contrast  Result Date: 12/24/2017 CLINICAL DATA:  Abdominal pain and fever. Abscess is suspected. Lethargy and anorexia. Recent diagnosis of colon cancer. EXAM: CT CHEST, ABDOMEN, AND PELVIS WITH CONTRAST TECHNIQUE: Multidetector CT imaging of the chest, abdomen and pelvis was performed following the standard protocol during bolus administration of intravenous contrast. CONTRAST:  148m OMNIPAQUE IOHEXOL 300 MG/ML  SOLN COMPARISON:  Abdomen and pelvis  CT 09/29/2017.  Chest CT 06/28/2017. FINDINGS: CT CHEST FINDINGS Cardiovascular: The heart size is normal. No substantial pericardial effusion. Coronary artery calcification is evident. Atherosclerotic calcification is noted in the wall of the thoracic aorta. Mediastinum/Nodes: No mediastinal lymphadenopathy. There is no hilar lymphadenopathy. The esophagus has normal imaging features. There is no axillary lymphadenopathy. Lungs/Pleura: The central tracheobronchial airways are patent. 7 mm right middle lobe nodule is stable in the interval. Dependent atelectasis noted in the lower lobes bilaterally. No edema or pleural effusion. Musculoskeletal: No worrisome lytic or sclerotic osseous abnormality. CT ABDOMEN PELVIS FINDINGS Hepatobiliary: No focal abnormality within the liver parenchyma. Gallbladder surgically absent. No intrahepatic or extrahepatic biliary dilation. Pancreas: Pancreas is atrophic. Surgical clips are seen in the region of the pancreatic tail. No dilatation of the main pancreatic duct. Spleen: Abnormal soft tissue again noted in the splenic hilum, substantially progressed since 09/29/2017. There is altered perfusion to the spleen, potentially related to sites of infiltrative disease or infarct. Adrenals/Urinary Tract: No adrenal nodule or mass. 7 mm nonobstructing stone identified lower pole right kidney. Left kidney unremarkable. No evidence for hydroureter. The urinary bladder appears normal for the degree of distention. Stomach/Bowel: Stomach is markedly distorted due to a heterogeneous complex left upper lobe process (see below). Duodenum is normally positioned as is the ligament of Treitz. Duodenal diverticuli evident. No small bowel wall thickening. No small bowel dilatation. The terminal ileum is normal. The appendix is normal. Anastomotic suture line noted in the transverse colon with associated circumferential wall thickening. The transverse colon in the region the anastomosis is  incorporated into the complex left upper quadrant process (see below). Vascular/Lymphatic: There is abdominal aortic atherosclerosis without aneurysm. There is no gastrohepatic or hepatoduodenal ligament lymphadenopathy. No intraperitoneal or retroperitoneal lymphadenopathy. No pelvic sidewall lymphadenopathy. Reproductive: The prostate gland and seminal vesicles have normal imaging features. Other: Complex process in the left upper quadrant involves the stomach, spleen, tail of pancreas, and distal transverse colon in the region of the anastomosis. On the previous study 3 months ago, there was only a 4.7 x 4.7 cm irregular soft tissue mass tethered between the stomach and the splenic hilum. This did track inferiorly and medially towards the tail of pancreas. On today's exam, there is a multi loculated, multicystic process in the left upper quadrant. This includes 2 separate rim enhancing collections in or on the wall of the greater curvature. 1 of these measures 3.7 x 4.7 cm (52/3) and the other more proximal lesion measures 2.4 x 3.6 cm. This latter collection appears to track into an irregular rim enhancing 4.4 x 3.3 cm collection dissecting into the splenic hilum. There  is edema and inflammation in this region with fluid tracking throughout the left upper quadrant and around the spleen. Again this process tethers the proximal stomach, transverse colon, spleen, and pancreatic tail. Musculoskeletal: No worrisome lytic or sclerotic osseous abnormality. IMPRESSION: Interval development of a complex multicystic process in the left upper quadrant of the abdomen. There are multiple rim enhancing complex fluid collections ranging in size from about 2 mm up to 5 mm. Two of these collections are along the greater curvature of the stomach and the more distal of the two may be intramural. The process dissects/insinuates into the splenic hilum and probably infiltrates the splenic parenchyma although compromise of the splenic  vascular anatomy in the hilum with subsequent splenic infarct would also be a consideration. The distal transverse colon at the level of the anastomosis is retracted into this process. Imaging features likely reflect multifocal abscess. Compromise of the tethered colon as etiology for these changes would be a consideration. Given the appearance today, cystic metastatic disease is considered less likely given the marked interval change, although this possibility cannot be excluded. Electronically Signed   By: Misty Stanley M.D.   On: 12/24/2017 20:10   Ct Abdomen Pelvis W Contrast  Result Date: 12/24/2017 CLINICAL DATA:  Abdominal pain and fever. Abscess is suspected. Lethargy and anorexia. Recent diagnosis of colon cancer. EXAM: CT CHEST, ABDOMEN, AND PELVIS WITH CONTRAST TECHNIQUE: Multidetector CT imaging of the chest, abdomen and pelvis was performed following the standard protocol during bolus administration of intravenous contrast. CONTRAST:  119m OMNIPAQUE IOHEXOL 300 MG/ML  SOLN COMPARISON:  Abdomen and pelvis CT 09/29/2017.  Chest CT 06/28/2017. FINDINGS: CT CHEST FINDINGS Cardiovascular: The heart size is normal. No substantial pericardial effusion. Coronary artery calcification is evident. Atherosclerotic calcification is noted in the wall of the thoracic aorta. Mediastinum/Nodes: No mediastinal lymphadenopathy. There is no hilar lymphadenopathy. The esophagus has normal imaging features. There is no axillary lymphadenopathy. Lungs/Pleura: The central tracheobronchial airways are patent. 7 mm right middle lobe nodule is stable in the interval. Dependent atelectasis noted in the lower lobes bilaterally. No edema or pleural effusion. Musculoskeletal: No worrisome lytic or sclerotic osseous abnormality. CT ABDOMEN PELVIS FINDINGS Hepatobiliary: No focal abnormality within the liver parenchyma. Gallbladder surgically absent. No intrahepatic or extrahepatic biliary dilation. Pancreas: Pancreas is  atrophic. Surgical clips are seen in the region of the pancreatic tail. No dilatation of the main pancreatic duct. Spleen: Abnormal soft tissue again noted in the splenic hilum, substantially progressed since 09/29/2017. There is altered perfusion to the spleen, potentially related to sites of infiltrative disease or infarct. Adrenals/Urinary Tract: No adrenal nodule or mass. 7 mm nonobstructing stone identified lower pole right kidney. Left kidney unremarkable. No evidence for hydroureter. The urinary bladder appears normal for the degree of distention. Stomach/Bowel: Stomach is markedly distorted due to a heterogeneous complex left upper lobe process (see below). Duodenum is normally positioned as is the ligament of Treitz. Duodenal diverticuli evident. No small bowel wall thickening. No small bowel dilatation. The terminal ileum is normal. The appendix is normal. Anastomotic suture line noted in the transverse colon with associated circumferential wall thickening. The transverse colon in the region the anastomosis is incorporated into the complex left upper quadrant process (see below). Vascular/Lymphatic: There is abdominal aortic atherosclerosis without aneurysm. There is no gastrohepatic or hepatoduodenal ligament lymphadenopathy. No intraperitoneal or retroperitoneal lymphadenopathy. No pelvic sidewall lymphadenopathy. Reproductive: The prostate gland and seminal vesicles have normal imaging features. Other: Complex process in the left upper quadrant involves  the stomach, spleen, tail of pancreas, and distal transverse colon in the region of the anastomosis. On the previous study 3 months ago, there was only a 4.7 x 4.7 cm irregular soft tissue mass tethered between the stomach and the splenic hilum. This did track inferiorly and medially towards the tail of pancreas. On today's exam, there is a multi loculated, multicystic process in the left upper quadrant. This includes 2 separate rim enhancing collections  in or on the wall of the greater curvature. 1 of these measures 3.7 x 4.7 cm (52/3) and the other more proximal lesion measures 2.4 x 3.6 cm. This latter collection appears to track into an irregular rim enhancing 4.4 x 3.3 cm collection dissecting into the splenic hilum. There is edema and inflammation in this region with fluid tracking throughout the left upper quadrant and around the spleen. Again this process tethers the proximal stomach, transverse colon, spleen, and pancreatic tail. Musculoskeletal: No worrisome lytic or sclerotic osseous abnormality. IMPRESSION: Interval development of a complex multicystic process in the left upper quadrant of the abdomen. There are multiple rim enhancing complex fluid collections ranging in size from about 2 mm up to 5 mm. Two of these collections are along the greater curvature of the stomach and the more distal of the two may be intramural. The process dissects/insinuates into the splenic hilum and probably infiltrates the splenic parenchyma although compromise of the splenic vascular anatomy in the hilum with subsequent splenic infarct would also be a consideration. The distal transverse colon at the level of the anastomosis is retracted into this process. Imaging features likely reflect multifocal abscess. Compromise of the tethered colon as etiology for these changes would be a consideration. Given the appearance today, cystic metastatic disease is considered less likely given the marked interval change, although this possibility cannot be excluded. Electronically Signed   By: Misty Stanley M.D.   On: 12/24/2017 20:10   Dg Chest Port 1 View  Result Date: 12/24/2017 CLINICAL DATA:  Fever, malaise and weakness. EXAM: PORTABLE CHEST 1 VIEW COMPARISON:  Chest CT dated 06/28/2017. FINDINGS: Poor inspiration. Borderline enlarged heart. Tortuous aorta. Mild left basilar predominantly linear density. Minimal diffuse peribronchial thickening and accentuation of the  interstitial markings. Thoracic spine degenerative changes. IMPRESSION: 1. Poor inspiration with mild left basilar atelectasis. Underlying pneumonia could not be absolutely excluded. 2. Minimal bronchitic changes. Electronically Signed   By: Claudie Revering M.D.   On: 12/24/2017 17:17        Scheduled Meds: . colestipol  1 g Oral BID  . megestrol  40 mg Oral Daily  . multivitamin with minerals  1 tablet Oral Daily  . sulfaSALAzine  1,500 mg Oral BID WC   Continuous Infusions: . sodium chloride 75 mL/hr at 12/26/17 0525  . cefTRIAXone (ROCEPHIN)  IV 2 g (12/25/17 2007)  . metronidazole 500 mg (12/26/17 0526)  . potassium chloride       LOS: 2 days    Time spent: 35 mins.More than 50% of that time was spent in counseling and/or coordination of care.      Shelly Coss, MD [facility] Pager [phone]  If 7PM-7AM, please contact night-coverage [email or web address] Password [facility] 12/26/2017, 9:50 AM

## 2017-12-27 ENCOUNTER — Encounter (HOSPITAL_COMMUNITY): Payer: Self-pay

## 2017-12-27 ENCOUNTER — Inpatient Hospital Stay (HOSPITAL_COMMUNITY): Payer: Medicare Other

## 2017-12-27 LAB — BASIC METABOLIC PANEL
Anion gap: 12 (ref 5–15)
BUN: 6 mg/dL — ABNORMAL LOW (ref 8–23)
CO2: 19 mmol/L — ABNORMAL LOW (ref 22–32)
CREATININE: 0.94 mg/dL (ref 0.61–1.24)
Calcium: 7.5 mg/dL — ABNORMAL LOW (ref 8.9–10.3)
Chloride: 104 mmol/L (ref 98–111)
GFR calc Af Amer: >60 mL/min (ref >60)
GFR calc non Af Amer: >60 mL/min (ref >60)
Glucose, Bld: 107 mg/dL — ABNORMAL HIGH (ref 70–99)
Potassium: 3.4 mmol/L — ABNORMAL LOW (ref 3.5–5.1)
SODIUM: 135 mmol/L (ref 135–145)

## 2017-12-27 LAB — CBC WITH DIFFERENTIAL/PLATELET
Abs Immature Granulocytes: 0.2 10*3/uL — ABNORMAL HIGH (ref 0.00–0.07)
BASOS PCT: 1 %
Basophils Absolute: 0.1 10*3/uL (ref 0.0–0.1)
Eosinophils Absolute: 0.3 10*3/uL (ref 0.0–0.5)
Eosinophils Relative: 2 %
HCT: 30.4 % — ABNORMAL LOW (ref 39.0–52.0)
Hemoglobin: 9.5 g/dL — ABNORMAL LOW (ref 13.0–17.0)
Immature Granulocytes: 1 %
Lymphocytes Relative: 6 %
Lymphs Abs: 1.2 10*3/uL (ref 0.7–4.0)
MCH: 24.5 pg — ABNORMAL LOW (ref 26.0–34.0)
MCHC: 31.3 g/dL (ref 30.0–36.0)
MCV: 78.6 fL — ABNORMAL LOW (ref 80.0–100.0)
Monocytes Absolute: 1.6 10*3/uL — ABNORMAL HIGH (ref 0.1–1.0)
Monocytes Relative: 9 %
NEUTROS PCT: 81 %
Neutro Abs: 15.4 10*3/uL — ABNORMAL HIGH (ref 1.7–7.7)
Platelets: 398 10*3/uL (ref 150–400)
RBC: 3.87 MIL/uL — AB (ref 4.22–5.81)
RDW: 19.4 % — AB (ref 11.5–15.5)
WBC: 18.8 10*3/uL — ABNORMAL HIGH (ref 4.0–10.5)
nRBC: 0 % (ref 0.0–0.2)

## 2017-12-27 LAB — PROTIME-INR
INR: 1.43
Prothrombin Time: 17.3 seconds — ABNORMAL HIGH (ref 11.4–15.2)

## 2017-12-27 MED ORDER — MIDAZOLAM HCL 2 MG/2ML IJ SOLN
INTRAMUSCULAR | Status: AC | PRN
  Administered 2017-12-27: 1 mg via INTRAVENOUS

## 2017-12-27 MED ORDER — OXYCODONE-ACETAMINOPHEN 5-325 MG PO TABS
1.0000 | ORAL_TABLET | ORAL | Status: DC | PRN
  Administered 2017-12-27: 1 via ORAL
  Filled 2017-12-27: qty 1

## 2017-12-27 MED ORDER — FENTANYL CITRATE (PF) 100 MCG/2ML IJ SOLN
INTRAMUSCULAR | Status: AC
  Filled 2017-12-27: qty 4

## 2017-12-27 MED ORDER — LIDOCAINE HCL 1 % IJ SOLN
INTRAMUSCULAR | Status: AC
  Filled 2017-12-27: qty 20

## 2017-12-27 MED ORDER — SODIUM CHLORIDE 0.9% FLUSH
5.0000 mL | Freq: Three times a day (TID) | INTRAVENOUS | Status: DC
  Administered 2017-12-27 (×2): 5 mL

## 2017-12-27 MED ORDER — FENTANYL CITRATE (PF) 100 MCG/2ML IJ SOLN
INTRAMUSCULAR | Status: AC | PRN
  Administered 2017-12-27: 25 ug via INTRAVENOUS

## 2017-12-27 MED ORDER — POTASSIUM CHLORIDE 10 MEQ/100ML IV SOLN
10.0000 meq | INTRAVENOUS | Status: DC
  Filled 2017-12-27: qty 100

## 2017-12-27 MED ORDER — POTASSIUM CHLORIDE CRYS ER 20 MEQ PO TBCR
40.0000 meq | EXTENDED_RELEASE_TABLET | Freq: Once | ORAL | Status: AC
  Administered 2017-12-27: 40 meq via ORAL
  Filled 2017-12-27: qty 2

## 2017-12-27 MED ORDER — MIDAZOLAM HCL 2 MG/2ML IJ SOLN
INTRAMUSCULAR | Status: AC
  Filled 2017-12-27: qty 4

## 2017-12-27 NOTE — Care Management Important Message (Signed)
Important Message  Patient Details  Name: Cody Suarez MRN: [id number] Date of Birth: [birth year]   Medicare Important Message Given:  Yes    Nicolo Tomko P Marquette Piontek 12/27/2017, 3:06 PM

## 2017-12-27 NOTE — Procedures (Signed)
LUQ abscess drain 10 Fr Bloody pus 40 cc EBL 10 cc Comp 0

## 2017-12-27 NOTE — Progress Notes (Signed)
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  He also complained of decreased appetite.  On arrival he was found to be febrile and tachycardic.  Also found to have leukocytosis.  CT imaging showed possible intra-abdominal abscess.  Surgery and IR consulted.  Started on antibiotics.Plan is to do IR guided drainage today.Dr Learta Codding consulted.  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  Presented with leukocytosis, lactic acidosis.  Continue IV fluids and antibiotics.  On ceftriaxone and Flagyl.  Follow blood cultures.    Mild grade fever this morning.  Surgery consulted but patient denies surgical intervention.  IR consulted today.  Plan for IR guided drainage with culture and biopsy after his INR is optimal.That will happen today.  Intra-abdominal abscess: CT scan showed multifocal abdominal abscess. Read TI:[id number] multicystic process in the left upper quadrant of the abdomen. There are multiple rim enhancing complex fluid collections ranging in size from about 2 mm up to 5 mm. Two of these collections are along the greater curvature of the stomach and the more distal of the two may be intramural There is a possibility that this could be cystic metastatic  masses but is  less likely.  Possible pneumonia: Chest x-ray showed possible left lower lobe pneumonia versus atelectasis.  Already on antibiotics.  Respiratory status stable.  Metastatic adenocarcinoma the colon: Initially diagnosed in 2015.  Had recurrence in 2017.  Status post chemotherapy.  Follows with Dr. Learta Codding.  Contacted Dr Learta Codding today who will see him tomorrow.  Anemia of chronic disease: Continue to monitor H&H.  Currently stable.  Anorexia: Associated with malignancy. Continue Megace.  History of DVT: On Xarelto.  Which will be on hold.INR was  In the range of 3 on presentation.  Continue monitoring INR.Today in the range of 1.4.Will restart after the procedure.  History of ulcerative colitis: On sulfasalazine           DVT prophylaxis:SCD Code Status: Full Family Communication: Discussed with wife at the bedside Disposition Plan: Home after IR drainage   Consultants: IR, surgery  Procedures: None  Antimicrobials: Ceftriaxone and Flagyl day 4  Subjective: Patient seen and examined at the bedside this morning.  Low grade fever  this morning.  Otherwise hemodynamically stable.  Denies any abdominal pain, nausea, vomiting .Diarrhoea slowed down.  Respiratory status stable.  Objective: Vitals:   12/26/17 2010 12/27/17 0015 12/27/17 0438 12/27/17 0837  BP: (!) 134/55 (!) 127/57 (!) 148/62 (!) 141/60  Pulse:  92 94 93  Resp: 19 (!) 22 (!) 26 18  Temp: 99.6 F (37.6 C) 99 F (37.2 C) 99 F (37.2 C) 99.5 F (37.5 C)  TempSrc: Oral Oral Oral Oral  SpO2: 97% 97% 99% 95%  Weight:      Height:        Intake/Output Summary (Last 24 hours) at 12/27/2017 1006 Last data filed  at 12/27/2017 0838 Gross per 24 hour  Intake 290 ml  Output -  Net 290 ml   Filed Weights   12/24/17 1639 12/24/17 2234  Weight: 77.1 kg 77.6 kg    Examination:  General exam: Not in distress,average built HEENT:PERRL,Oral mucosa moist, Ear/Nose normal on gross exam Respiratory system:  Bilateral equal air entry, normal vesicular breath sounds, no wheezes or crackles  Cardiovascular system: S1 & S2 heard, RRR. No JVD, murmurs, rubs, gallops or clicks. No pedal edema. Gastrointestinal system: Abdomen is nondistended, soft .No significant tenderness. No organomegaly or masses felt. Normal bowel sounds heard. Central nervous system: Alert and oriented. No focal neurological deficits. Extremities: No edema, no clubbing ,no cyanosis, distal peripheral pulses palpable. Skin: No rashes, lesions or ulcers,no icterus ,no pallor MSK: Normal muscle bulk,tone ,power Psychiatry: Judgement and insight appear normal. Mood & affect appropriate.     Data Reviewed: I have personally reviewed following labs and imaging studies  CBC: Recent Labs  Lab 12/24/17 1642 12/25/17 0238 12/26/17 0340 12/27/17 0328  WBC 25.2* 20.3* 22.0* 18.8*  NEUTROABS 20.4*  --  18.3* 15.4*  HGB 11.8* 9.6* 9.9* 9.5*  HCT 37.2* 30.4* 31.7* 30.4*  MCV 80.2 79.8* 78.3* 78.6*  PLT 493* 402* 409* 578   Basic Metabolic Panel: Recent Labs  Lab 12/24/17 1642 12/25/17 0238 12/26/17 0340 12/27/17 0328  NA 131* 133* 134* 135  K 3.8 3.9 3.3* 3.4*  CL 94* 101 102 104  CO2 22 19* 19* 19*  GLUCOSE 162* 161* 126* 107*  BUN 12 10 6* 6*  CREATININE 1.29* 1.21 1.23 0.94  CALCIUM 8.7* 7.5* 7.6* 7.5*   GFR: Estimated Creatinine Clearance: 72.3 mL/min (by C-G formula based on SCr of 0.94 mg/dL). Liver Function Tests: Recent Labs  Lab 12/24/17 1642 12/25/17 0238  AST 15 11*  ALT 13 9  ALKPHOS 75 63  BILITOT 1.0 0.7  PROT 7.8 5.9*  ALBUMIN 3.0* 2.1*   No results for input(s): LIPASE, AMYLASE in the last 168 hours. No results for input(s): AMMONIA in the last 168 hours. Coagulation Profile: Recent Labs  Lab 12/24/17 1642 12/26/17 0340 12/27/17 0328  INR 3.45 1.72 1.43   Cardiac Enzymes: No results for input(s): CKTOTAL, CKMB, CKMBINDEX, TROPONINI in the last 168 hours. BNP (last 3 results) No  results for input(s): PROBNP in the last 8760 hours. HbA1C: No results for input(s): HGBA1C in the last 72 hours. CBG: No results for input(s): GLUCAP in the last 168 hours. Lipid Profile: No results for input(s): CHOL, HDL, LDLCALC, TRIG, CHOLHDL, LDLDIRECT in the last 72 hours. Thyroid Function Tests: No results for input(s): TSH, T4TOTAL, FREET4, T3FREE, THYROIDAB in the last 72 hours. Anemia Panel: No results for input(s): VITAMINB12, FOLATE, FERRITIN, TIBC, IRON, RETICCTPCT in the last 72 hours. Sepsis Labs: Recent Labs  Lab 12/24/17 1705  LATICACIDVEN 1.93*    Recent Results (from the past 240 hour(s))  Blood Culture (routine x 2)     Status: None (Preliminary result)   Collection Time: 12/24/17  4:53 PM  Result Value Ref Range Status   Specimen Description BLOOD LEFT ANTECUBITAL  Final   Special Requests   Final    BOTTLES DRAWN AEROBIC AND ANAEROBIC Blood Culture adequate volume   Culture   Final    NO GROWTH 2 DAYS Performed at [facility], [address], [place], [place] [address]    Report Status PENDING  Incomplete  Blood Culture (routine x 2)     Status:  None (Preliminary result)   Collection Time: 12/24/17  5:20 PM  Result Value Ref Range Status   Specimen Description BLOOD LEFT FOREARM  Final   Special Requests   Final    BOTTLES DRAWN AEROBIC AND ANAEROBIC Blood Culture adequate volume   Culture   Final    NO GROWTH 2 DAYS Performed at [facility], [address] [place], [place] [address]    Report Status PENDING  Incomplete         Radiology Studies: No results found.      Scheduled Meds: . colestipol  1 g Oral BID  . megestrol  40 mg Oral Daily  . multivitamin with minerals  1 tablet Oral Daily  . potassium chloride  40 mEq Oral Once  . sulfaSALAzine  1,500 mg Oral BID WC   Continuous Infusions: . sodium chloride 75 mL/hr at 12/26/17 1722  . cefTRIAXone (ROCEPHIN)  IV 2 g (12/26/17 2021)  . metronidazole 500 mg  (12/27/17 0537)     LOS: 3 days    Time spent: 35 mins.More than 50% of that time was spent in counseling and/or coordination of care.      Shelly Coss, MD [facility] Pager [phone]  If 7PM-7AM, please contact night-coverage [email or web address] Password [email or web address] 12/27/2017, 10:06 AM

## 2017-12-27 NOTE — Progress Notes (Signed)
   Subjective/Chief Complaint: No abdominal pain   Objective: Vital signs in last 24 hours: Temp:  [98.2 F (36.8 C)-99.6 F (37.6 C)] 99.5 F (37.5 C) (12/23 0837) Pulse Rate:  [81-95] 93 (12/23 0837) Resp:  [18-26] 18 (12/23 0837) BP: (122-148)/(55-65) 141/60 (12/23 0837) SpO2:  [95 %-99 %] 95 % (12/23 0837) Last BM Date: 12/26/17  Intake/Output from previous day: 12/22 0701 - 12/23 0700 In: 530 [P.O.:530] Out: -  Intake/Output this shift: No intake/output data recorded.  General appearance: alert and cooperative Resp: clear to auscultation bilaterally GI: soft, nontender  Lab Results:  Recent Labs    12/26/17 0340 12/27/17 0328  WBC 22.0* 18.8*  HGB 9.9* 9.5*  HCT 31.7* 30.4*  PLT 409* 398   BMET Recent Labs    12/26/17 0340 12/27/17 0328  NA 134* 135  K 3.3* 3.4*  CL 102 104  CO2 19* 19*  GLUCOSE 126* 107*  BUN 6* 6*  CREATININE 1.23 0.94  CALCIUM 7.6* 7.5*   PT/INR Recent Labs    12/26/17 0340 12/27/17 0328  LABPROT 19.9* 17.3*  INR 1.72 1.43   ABG No results for input(s): PHART, HCO3 in the last 72 hours.  Invalid input(s): PCO2, PO2  Studies/Results: No results found.  Anti-infectives: Anti-infectives (From admission, onward)   Start     Dose/Rate Route Frequency Ordered Stop   12/24/17 2300  cefTRIAXone (ROCEPHIN) 1 g in sodium chloride 0.9 % 100 mL IVPB  Status:  Discontinued     1 g 200 mL/hr over 30 Minutes Intravenous Every 24 hours 12/24/17 2247 12/24/17 2252   12/24/17 2300  metroNIDAZOLE (FLAGYL) IVPB 500 mg  Status:  Discontinued     500 mg 100 mL/hr over 60 Minutes Intravenous Every 8 hours 12/24/17 2247 12/24/17 2252   12/24/17 2300  metroNIDAZOLE (FLAGYL) IVPB 500 mg     500 mg 100 mL/hr over 60 Minutes Intravenous Every 8 hours 12/24/17 2233     12/24/17 2030  cefTRIAXone (ROCEPHIN) 2 g in sodium chloride 0.9 % 100 mL IVPB     2 g 200 mL/hr over 30 Minutes Intravenous Every 24 hours 12/24/17 2017     12/24/17  2030  metroNIDAZOLE (FLAGYL) IVPB 500 mg  Status:  Discontinued     500 mg 100 mL/hr over 60 Minutes Intravenous Every 8 hours 12/24/17 2017 12/24/17 2233      Assessment/Plan: Metastatic colon cancer with possible intra-abdominal abscesses versus extension of the disease involving multiple LUQ organs  Cont iv abx Not a surgical candidate Agree with IR drainage planned today Medical oncology consult  LOS: 3 days    Zenovia Jarred 12/27/2017

## 2017-12-28 ENCOUNTER — Telehealth: Payer: Self-pay | Admitting: *Deleted

## 2017-12-28 DIAGNOSIS — C189 Malignant neoplasm of colon, unspecified: Secondary | ICD-10-CM

## 2017-12-28 DIAGNOSIS — K651 Peritoneal abscess: Secondary | ICD-10-CM

## 2017-12-28 LAB — CBC WITH DIFFERENTIAL/PLATELET
Abs Immature Granulocytes: 0.12 10*3/uL — ABNORMAL HIGH (ref 0.00–0.07)
Basophils Absolute: 0.1 10*3/uL (ref 0.0–0.1)
Basophils Relative: 1 %
Eosinophils Absolute: 0.5 10*3/uL (ref 0.0–0.5)
Eosinophils Relative: 3 %
HCT: 29.5 % — ABNORMAL LOW (ref 39.0–52.0)
Hemoglobin: 9.2 g/dL — ABNORMAL LOW (ref 13.0–17.0)
Immature Granulocytes: 1 %
Lymphocytes Relative: 7 %
Lymphs Abs: 1.1 10*3/uL (ref 0.7–4.0)
MCH: 24.4 pg — ABNORMAL LOW (ref 26.0–34.0)
MCHC: 31.2 g/dL (ref 30.0–36.0)
MCV: 78.2 fL — AB (ref 80.0–100.0)
Monocytes Absolute: 1.2 10*3/uL — ABNORMAL HIGH (ref 0.1–1.0)
Monocytes Relative: 7 %
NEUTROS ABS: 12.6 10*3/uL — AB (ref 1.7–7.7)
Neutrophils Relative %: 81 %
PLATELETS: 471 10*3/uL — AB (ref 150–400)
RBC: 3.77 MIL/uL — ABNORMAL LOW (ref 4.22–5.81)
RDW: 19.5 % — ABNORMAL HIGH (ref 11.5–15.5)
WBC: 15.5 10*3/uL — ABNORMAL HIGH (ref 4.0–10.5)
nRBC: 0 % (ref 0.0–0.2)

## 2017-12-28 LAB — BASIC METABOLIC PANEL
Anion gap: 9 (ref 5–15)
BUN: 5 mg/dL — AB (ref 8–23)
CO2: 23 mmol/L (ref 22–32)
Calcium: 7.5 mg/dL — ABNORMAL LOW (ref 8.9–10.3)
Chloride: 104 mmol/L (ref 98–111)
Creatinine, Ser: 0.48 mg/dL — ABNORMAL LOW (ref 0.61–1.24)
GFR calc Af Amer: >60 mL/min (ref >60)
GFR calc non Af Amer: >60 mL/min (ref >60)
GLUCOSE: 114 mg/dL — AB (ref 70–99)
Potassium: 4 mmol/L (ref 3.5–5.1)
SODIUM: 136 mmol/L (ref 135–145)

## 2017-12-28 LAB — PROTIME-INR
INR: 1.4
Prothrombin Time: 17 seconds — ABNORMAL HIGH (ref 11.4–15.2)

## 2017-12-28 MED ORDER — AMOXICILLIN-POT CLAVULANATE 875-125 MG PO TABS: 1.0000 | ORAL_TABLET | Freq: Two times a day (BID) | ORAL | 0 refills | Status: DC

## 2017-12-28 MED ORDER — OXYCODONE-ACETAMINOPHEN 5-325 MG PO TABS: 1.0000 | ORAL_TABLET | ORAL | 0 refills | Status: DC | PRN

## 2017-12-28 NOTE — Progress Notes (Signed)
Patient ID: Cody Suarez, male   DOB: 09/24/1945, 72 y.o.   MRN: [id number]   IR successfully placed drain yesterday.  Draining well. WBC decreased Again, not a surgical candidate and he doesn't want any. Nothing further for [place] to offer right now. He can follow-up with Dr. Kaylyn Lim in our office is needed Will sign off

## 2017-12-28 NOTE — Progress Notes (Signed)
Referring Physician(s): Dr Dennis Bast Dr Tawanna Solo  Supervising Physician: Daryll Brod  Patient Status:  [facility] - In-pt  Chief Complaint:  LUQ abscess drain placed 12/23 in IR  Subjective:  Met colon cancer Intra abd abscess Drain placed 12/23 OP good Milky serous fluid  Allergies: Amlodipine and Gabapentin  Medications: Prior to Admission medications   Medication Sig Start Date End Date Taking? Authorizing Provider  acetaminophen (TYLENOL) 500 MG tablet Take 1,000 mg by mouth daily as needed for headache (pain).    Yes [provider]  Coenzyme Q10 (COQ10 PO) Take 1 capsule by mouth daily.   Yes [provider]  diphenhydramine-acetaminophen (TYLENOL PM) 25-500 MG TABS tablet Take 2 tablets by mouth at bedtime as needed (sleep).   Yes [provider]  Homeopathic Products ([place] EX) Apply 1 application topically daily as needed (calf pain).   Yes [provider]  Liniments (BLUE-EMU SUPER STRENGTH EX) Apply 1 application topically daily as needed (knee pain).   Yes [provider]  losartan (COZAAR) 100 MG tablet Take 0.5 tablets (50 mg total) by mouth daily. 10/21/17  Yes [name], MD  Multiple Vitamin (MULTIVITAMIN WITH MINERALS) TABS tablet Take 1 tablet by mouth daily.   Yes [provider]  predniSONE (DELTASONE) 5 MG tablet Take 5 mg by mouth daily.  03/29/17  Yes [provider]  rivaroxaban (XARELTO) 20 MG TABS tablet Take 1 tablet (20 mg total) by mouth daily with breakfast. Lot [id number]  06/2019 x2 11/26/17  Yes [name], MD  spironolactone (ALDACTONE) 25 MG tablet TAKE 1 TABLET BY MOUTH ONCE DAILY Patient taking differently: Take 25 mg by mouth daily.  12/08/17  Yes [name], MD  sulfaSALAzine (AZULFIDINE) 500 MG EC tablet Take 1,500 mg by mouth daily.  04/23/17  Yes [provider]  sildenafil (VIAGRA) 50 MG tablet 1-2 tabs as needed for erectile  dysfunction Patient not taking: Reported on 12/24/2017 05/05/17   Midge Minium, MD     Vital Signs: BP 139/61 (BP Location: Right Arm)   Pulse 88   Temp 98.3 F (36.8 C) (Oral)   Resp 18   Ht 5' 10"  (1.778 m)   Wt 177 lb 11.1 oz (80.6 kg)   SpO2 95%   BMI 25.50 kg/m   Physical Exam Vitals signs reviewed.  Skin:    General: Skin is warm and dry.     Comments: Site is clean and dry OP milky serous color 275 cc OP yesterday 30 cc in JP  Cx pending     Imaging: Ct Chest W Contrast  Result Date: 12/24/2017 CLINICAL DATA:  Abdominal pain and fever. Abscess is suspected. Lethargy and anorexia. Recent diagnosis of colon cancer. EXAM: CT CHEST, ABDOMEN, AND PELVIS WITH CONTRAST TECHNIQUE: Multidetector CT imaging of the chest, abdomen and pelvis was performed following the standard protocol during bolus administration of intravenous contrast. CONTRAST:  118m OMNIPAQUE IOHEXOL 300 MG/ML  SOLN COMPARISON:  Abdomen and pelvis CT 09/29/2017.  Chest CT 06/28/2017. FINDINGS: CT CHEST FINDINGS Cardiovascular: The heart size is normal. No substantial pericardial effusion. Coronary artery calcification is evident. Atherosclerotic calcification is noted in the wall of the thoracic aorta. Mediastinum/Nodes: No mediastinal lymphadenopathy. There is no hilar lymphadenopathy. The esophagus has normal imaging features. There is no axillary lymphadenopathy. Lungs/Pleura: The central tracheobronchial airways are patent. 7 mm right middle lobe nodule is stable in the interval. Dependent atelectasis noted in the lower lobes bilaterally. No  edema or pleural effusion. Musculoskeletal: No worrisome lytic or sclerotic osseous abnormality. CT ABDOMEN PELVIS FINDINGS Hepatobiliary: No focal abnormality within the liver parenchyma. Gallbladder surgically absent. No intrahepatic or extrahepatic biliary dilation. Pancreas: Pancreas is atrophic. Surgical clips are seen in the region of the pancreatic tail. No  dilatation of the main pancreatic duct. Spleen: Abnormal soft tissue again noted in the splenic hilum, substantially progressed since 09/29/2017. There is altered perfusion to the spleen, potentially related to sites of infiltrative disease or infarct. Adrenals/Urinary Tract: No adrenal nodule or mass. 7 mm nonobstructing stone identified lower pole right kidney. Left kidney unremarkable. No evidence for hydroureter. The urinary bladder appears normal for the degree of distention. Stomach/Bowel: Stomach is markedly distorted due to a heterogeneous complex left upper lobe process (see below). Duodenum is normally positioned as is the ligament of Treitz. Duodenal diverticuli evident. No small bowel wall thickening. No small bowel dilatation. The terminal ileum is normal. The appendix is normal. Anastomotic suture line noted in the transverse colon with associated circumferential wall thickening. The transverse colon in the region the anastomosis is incorporated into the complex left upper quadrant process (see below). Vascular/Lymphatic: There is abdominal aortic atherosclerosis without aneurysm. There is no gastrohepatic or hepatoduodenal ligament lymphadenopathy. No intraperitoneal or retroperitoneal lymphadenopathy. No pelvic sidewall lymphadenopathy. Reproductive: The prostate gland and seminal vesicles have normal imaging features. Other: Complex process in the left upper quadrant involves the stomach, spleen, tail of pancreas, and distal transverse colon in the region of the anastomosis. On the previous study 3 months ago, there was only a 4.7 x 4.7 cm irregular soft tissue mass tethered between the stomach and the splenic hilum. This did track inferiorly and medially towards the tail of pancreas. On today's exam, there is a multi loculated, multicystic process in the left upper quadrant. This includes 2 separate rim enhancing collections in or on the wall of the greater curvature. 1 of these measures 3.7 x 4.7  cm (52/3) and the other more proximal lesion measures 2.4 x 3.6 cm. This latter collection appears to track into an irregular rim enhancing 4.4 x 3.3 cm collection dissecting into the splenic hilum. There is edema and inflammation in this region with fluid tracking throughout the left upper quadrant and around the spleen. Again this process tethers the proximal stomach, transverse colon, spleen, and pancreatic tail. Musculoskeletal: No worrisome lytic or sclerotic osseous abnormality. IMPRESSION: Interval development of a complex multicystic process in the left upper quadrant of the abdomen. There are multiple rim enhancing complex fluid collections ranging in size from about 2 mm up to 5 mm. Two of these collections are along the greater curvature of the stomach and the more distal of the two may be intramural. The process dissects/insinuates into the splenic hilum and probably infiltrates the splenic parenchyma although compromise of the splenic vascular anatomy in the hilum with subsequent splenic infarct would also be a consideration. The distal transverse colon at the level of the anastomosis is retracted into this process. Imaging features likely reflect multifocal abscess. Compromise of the tethered colon as etiology for these changes would be a consideration. Given the appearance today, cystic metastatic disease is considered less likely given the marked interval change, although this possibility cannot be excluded. Electronically Signed   By: Misty Stanley M.D.   On: 12/24/2017 20:10   Ct Abdomen Pelvis W Contrast  Result Date: 12/24/2017 CLINICAL DATA:  Abdominal pain and fever. Abscess is suspected. Lethargy and anorexia. Recent diagnosis of colon cancer.  EXAM: CT CHEST, ABDOMEN, AND PELVIS WITH CONTRAST TECHNIQUE: Multidetector CT imaging of the chest, abdomen and pelvis was performed following the standard protocol during bolus administration of intravenous contrast. CONTRAST:  130m OMNIPAQUE  IOHEXOL 300 MG/ML  SOLN COMPARISON:  Abdomen and pelvis CT 09/29/2017.  Chest CT 06/28/2017. FINDINGS: CT CHEST FINDINGS Cardiovascular: The heart size is normal. No substantial pericardial effusion. Coronary artery calcification is evident. Atherosclerotic calcification is noted in the wall of the thoracic aorta. Mediastinum/Nodes: No mediastinal lymphadenopathy. There is no hilar lymphadenopathy. The esophagus has normal imaging features. There is no axillary lymphadenopathy. Lungs/Pleura: The central tracheobronchial airways are patent. 7 mm right middle lobe nodule is stable in the interval. Dependent atelectasis noted in the lower lobes bilaterally. No edema or pleural effusion. Musculoskeletal: No worrisome lytic or sclerotic osseous abnormality. CT ABDOMEN PELVIS FINDINGS Hepatobiliary: No focal abnormality within the liver parenchyma. Gallbladder surgically absent. No intrahepatic or extrahepatic biliary dilation. Pancreas: Pancreas is atrophic. Surgical clips are seen in the region of the pancreatic tail. No dilatation of the main pancreatic duct. Spleen: Abnormal soft tissue again noted in the splenic hilum, substantially progressed since 09/29/2017. There is altered perfusion to the spleen, potentially related to sites of infiltrative disease or infarct. Adrenals/Urinary Tract: No adrenal nodule or mass. 7 mm nonobstructing stone identified lower pole right kidney. Left kidney unremarkable. No evidence for hydroureter. The urinary bladder appears normal for the degree of distention. Stomach/Bowel: Stomach is markedly distorted due to a heterogeneous complex left upper lobe process (see below). Duodenum is normally positioned as is the ligament of Treitz. Duodenal diverticuli evident. No small bowel wall thickening. No small bowel dilatation. The terminal ileum is normal. The appendix is normal. Anastomotic suture line noted in the transverse colon with associated circumferential wall thickening. The  transverse colon in the region the anastomosis is incorporated into the complex left upper quadrant process (see below). Vascular/Lymphatic: There is abdominal aortic atherosclerosis without aneurysm. There is no gastrohepatic or hepatoduodenal ligament lymphadenopathy. No intraperitoneal or retroperitoneal lymphadenopathy. No pelvic sidewall lymphadenopathy. Reproductive: The prostate gland and seminal vesicles have normal imaging features. Other: Complex process in the left upper quadrant involves the stomach, spleen, tail of pancreas, and distal transverse colon in the region of the anastomosis. On the previous study 3 months ago, there was only a 4.7 x 4.7 cm irregular soft tissue mass tethered between the stomach and the splenic hilum. This did track inferiorly and medially towards the tail of pancreas. On today's exam, there is a multi loculated, multicystic process in the left upper quadrant. This includes 2 separate rim enhancing collections in or on the wall of the greater curvature. 1 of these measures 3.7 x 4.7 cm (52/3) and the other more proximal lesion measures 2.4 x 3.6 cm. This latter collection appears to track into an irregular rim enhancing 4.4 x 3.3 cm collection dissecting into the splenic hilum. There is edema and inflammation in this region with fluid tracking throughout the left upper quadrant and around the spleen. Again this process tethers the proximal stomach, transverse colon, spleen, and pancreatic tail. Musculoskeletal: No worrisome lytic or sclerotic osseous abnormality. IMPRESSION: Interval development of a complex multicystic process in the left upper quadrant of the abdomen. There are multiple rim enhancing complex fluid collections ranging in size from about 2 mm up to 5 mm. Two of these collections are along the greater curvature of the stomach and the more distal of the two may be intramural. The process dissects/insinuates into the  splenic hilum and probably infiltrates the  splenic parenchyma although compromise of the splenic vascular anatomy in the hilum with subsequent splenic infarct would also be a consideration. The distal transverse colon at the level of the anastomosis is retracted into this process. Imaging features likely reflect multifocal abscess. Compromise of the tethered colon as etiology for these changes would be a consideration. Given the appearance today, cystic metastatic disease is considered less likely given the marked interval change, although this possibility cannot be excluded. Electronically Signed   By: Misty Stanley M.D.   On: 12/24/2017 20:10   Dg Chest Port 1 View  Result Date: 12/24/2017 CLINICAL DATA:  Fever, malaise and weakness. EXAM: PORTABLE CHEST 1 VIEW COMPARISON:  Chest CT dated 06/28/2017. FINDINGS: Poor inspiration. Borderline enlarged heart. Tortuous aorta. Mild left basilar predominantly linear density. Minimal diffuse peribronchial thickening and accentuation of the interstitial markings. Thoracic spine degenerative changes. IMPRESSION: 1. Poor inspiration with mild left basilar atelectasis. Underlying pneumonia could not be absolutely excluded. 2. Minimal bronchitic changes. Electronically Signed   By: Claudie Revering M.D.   On: 12/24/2017 17:17   Ct Image Guided Fluid Drain By Catheter  Result Date: 12/27/2017 INDICATION: Left upper quadrant abscess EXAM: CT-GUIDED ABSCESS DRAIN MEDICATIONS: The patient is currently admitted to the hospital and receiving intravenous antibiotics. The antibiotics were administered within an appropriate time frame prior to the initiation of the procedure. ANESTHESIA/SEDATION: Fentanyl 25 mcg IV; Versed 1 mg IV Moderate Sedation Time:  14 minutes The patient was continuously monitored during the procedure by the interventional radiology nurse under my direct supervision. COMPLICATIONS: None immediate. PROCEDURE: Informed written consent was obtained from the patient after a thorough discussion of the  procedural risks, benefits and alternatives. All questions were addressed. Maximal Sterile Barrier Technique was utilized including caps, mask, sterile gowns, sterile gloves, sterile drape, hand hygiene and skin antiseptic. A timeout was performed prior to the initiation of the procedure. Under CT guidance, an 18 gauge needle was inserted into the left upper quadrant abscess. It was removed over an Amplatz wire. Ten [place] drain was inserted over the wire. It was looped and string fixed in the abscess cavity. Reddish pus was aspirated. It was sewn to the skin. FINDINGS: Images document placement of a 10 French drain into a left upper quadrant abscess. IMPRESSION: Successful placement of a 10 French drain into a left upper quadrant abscess. Electronically Signed   By: [name] M.D.   On: 12/27/2017 14:54    Labs:  CBC: Recent Labs    12/25/17 0238 12/26/17 0340 12/27/17 0328 12/28/17 0304  WBC 20.3* 22.0* 18.8* 15.5*  HGB 9.6* 9.9* 9.5* 9.2*  HCT 30.4* 31.7* 30.4* 29.5*  PLT 402* 409* 398 471*    COAGS: Recent Labs    12/24/17 1642 12/26/17 0340 12/27/17 0328 12/28/17 0304  INR 3.45 1.72 1.43 1.40    BMP: Recent Labs    12/25/17 0238 12/26/17 0340 12/27/17 0328 12/28/17 0304  NA 133* 134* 135 136  K 3.9 3.3* 3.4* 4.0  CL 101 102 104 104  CO2 19* 19* 19* 23  GLUCOSE 161* 126* 107* 114*  BUN 10 6* 6* 5*  CALCIUM 7.5* 7.6* 7.5* 7.5*  CREATININE 1.21 1.23 0.94 0.48*  GFRNONAA 59* 58* >60 >60  GFRAA >60 >60 >60 >60    LIVER FUNCTION TESTS: Recent Labs    09/20/17 1032 10/19/17 0720 12/24/17 1642 12/25/17 0238  BILITOT 0.3 0.6 1.0 0.7  AST 15 26 15  11*  ALT 10 16 13 9   ALKPHOS 66 60 75 63  PROT 8.0 8.6* 7.8 5.9*  ALBUMIN 3.7 4.2 3.0* 2.1*    Assessment and Plan:  LUQ abscess Drain placed 12/23 If home with drain-- need to flush daily 5-10 cc  Record output OP orders in place for OP IR Clinic Pt will hear from scheduler for time and  date  Electronically Signed: Lavonia Drafts, PA-C 12/28/2017, 9:42 AM   I spent a total of 25 Minutes at the the patient's bedside AND on the patient's hospital floor or unit, greater than 50% of which was counseling/coordinating care for LUQ abscess drain

## 2017-12-28 NOTE — Discharge Summary (Signed)
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  He also complained of decreased appetite.  On arrival he was found to be febrile and tachycardic.  Also found to have leukocytosis.  CT imaging showed possible intra-abdominal abscess. Surgery and IR consulted.  Started on antibiotics.Underwent  IR guided drainage on 12/27/17.  Antibiotics changed to oral.  Plan is to discharge him home today with drain.  He will follow-up with IR and his oncologist Dr. Learta Codding  as an outpatient.  Following problems were addressed during his hospitalization:  Sepsis: Most likely secondary intra-abdominal abscess/possible pneumonia but less likely .  Presented with leukocytosis, lactic acidosis. Started on IV fluids and antibiotics, ceftriaxone and Flagyl.  Negative blood cultures so far .   Afebrile this morning. Underwent  IR guided drainage with culture .  Aerobic/Anaerobic Cultures showing few gram-negative bacilli. We will continue Augmentin for next 7 days.  Intra-abdominal abscess: CT scan showed multifocal abdominal abscess. Read [id number] multicystic process in the left upper quadrant of the abdomen. There are multiple rim enhancing complex fluid collections ranging in size from about 2 mm up to 5 mm. Two of these collections are along the greater curvature of the stomach and the more distal of the two may be intramural.S/P IR guided  drainage.  He was discharged on drain.  Follow-up with IR as an outpatient  Possible pneumonia: Chest x-ray showed possible left lower lobe pneumonia versus atelectasis.  Already on antibiotics.  Respiratory status stable.  Metastatic adenocarcinoma the colon: Initially diagnosed in 2015.  Had recurrence in 2017.  Status post chemotherapy.  Follows with Dr. Learta Codding.   Anemia of chronic disease: Associated with malignancy, chemotherapy. monitor as an outpatient.  Currently stable.  History of DVT: On Xarelto. Resume on discharge.  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 2)You are being discharged home with drain--  flush daily 5-10 cc .Record output. 3)Take prescribed medications as instructed.   Increase activity slowly   Complete by:  As directed      Allergies as of 12/28/2017      Reactions   Amlodipine Swelling   Right LE edema   Gabapentin Rash   Short-term memory decrease      Medication List    STOP taking these medications   sildenafil 50 MG tablet Commonly known as:  VIAGRA     TAKE these medications   acetaminophen 500 MG tablet Commonly known as:  TYLENOL Take 1,000 mg by mouth  daily as needed for headache (pain).   amoxicillin-clavulanate 875-125 MG tablet Commonly known as:  AUGMENTIN Take 1 tablet by mouth 2 (two) times daily for 7 days.   BLUE-EMU SUPER STRENGTH EX Apply 1  application topically daily as needed (knee pain).   COQ10 PO Take 1 capsule by mouth daily.   diphenhydramine-acetaminophen 25-500 MG Tabs tablet Commonly known as:  TYLENOL PM Take 2 tablets by mouth at bedtime as needed (sleep).   losartan 100 MG tablet Commonly known as:  COZAAR Take 0.5 tablets (50 mg total) by mouth daily.   multivitamin with minerals Tabs tablet Take 1 tablet by mouth daily.   oxyCODONE-acetaminophen 5-325 MG tablet Commonly known as:  PERCOCET/ROXICET Take 1-2 tablets by mouth every 4 (four) hours as needed for moderate pain or severe pain.   predniSONE 5 MG tablet Commonly known as:  DELTASONE Take 5 mg by mouth daily.   rivaroxaban 20 MG Tabs tablet Commonly known as:  XARELTO Take 1 tablet (20 mg total) by mouth daily with breakfast. Lot [id number]  06/2019 x2   spironolactone 25 MG tablet Commonly known as:  ALDACTONE TAKE 1 TABLET BY MOUTH ONCE DAILY   sulfaSALAzine 500 MG EC tablet Commonly known as:  AZULFIDINE Take 1,500 mg by mouth daily.   THERAWORX RELIEF EX Apply 1 application topically daily as needed (calf pain).      Follow-up Information    Hoss, Arthur, MD Follow up in 10 day(s).   Specialties:  Interventional Radiology, Radiology Why:  pt will hear from scheduler for follow up with Dr [facility]-- call [phone] if any questions Contact information: [address] [place] [address] [phone]        Midge Minium, MD. Schedule an appointment as soon as possible for a visit in 1 week(s).   Specialty:  Family Medicine Contact information: [address] [place] [address] [phone]          Allergies  Allergen Reactions  . Amlodipine Swelling    Right LE edema  . Gabapentin Rash    Short-term memory decrease    Consultations:  IR, general surgery,Oncology   Procedures/Studies: Ct Chest W Contrast  Result Date: 12/24/2017 CLINICAL DATA:  Abdominal pain and fever.  Abscess is suspected. Lethargy and anorexia. Recent diagnosis of colon cancer. EXAM: CT CHEST, ABDOMEN, AND PELVIS WITH CONTRAST TECHNIQUE: Multidetector CT imaging of the chest, abdomen and pelvis was performed following the standard protocol during bolus administration of intravenous contrast. CONTRAST:  16m OMNIPAQUE IOHEXOL 300 MG/ML  SOLN COMPARISON:  Abdomen and pelvis CT 09/29/2017.  Chest CT 06/28/2017. FINDINGS: CT CHEST FINDINGS Cardiovascular: The heart size is normal. No substantial pericardial effusion. Coronary artery calcification is evident. Atherosclerotic calcification is noted in the wall of the thoracic aorta. Mediastinum/Nodes: No mediastinal lymphadenopathy. There is no hilar lymphadenopathy. The esophagus has normal imaging features. There is no axillary lymphadenopathy. Lungs/Pleura: The central tracheobronchial airways are patent. 7 mm right middle lobe nodule is stable in the interval. Dependent atelectasis noted in the lower lobes bilaterally. No edema or pleural effusion. Musculoskeletal: No worrisome lytic or sclerotic osseous abnormality. CT ABDOMEN PELVIS FINDINGS Hepatobiliary: No focal abnormality within the liver parenchyma. Gallbladder surgically absent. No intrahepatic or extrahepatic biliary dilation. Pancreas: Pancreas is atrophic. Surgical clips are seen in the region of the pancreatic tail. No dilatation of the main pancreatic duct. Spleen: Abnormal soft tissue again noted in the splenic hilum, substantially progressed since 09/29/2017. There is  altered perfusion to the spleen, potentially related to sites of infiltrative disease or infarct. Adrenals/Urinary Tract: No adrenal nodule or mass. 7 mm nonobstructing stone identified lower pole right kidney. Left kidney unremarkable. No evidence for hydroureter. The urinary bladder appears normal for the degree of distention. Stomach/Bowel: Stomach is markedly distorted due to a heterogeneous complex left upper lobe process (see  below). Duodenum is normally positioned as is the ligament of Treitz. Duodenal diverticuli evident. No small bowel wall thickening. No small bowel dilatation. The terminal ileum is normal. The appendix is normal. Anastomotic suture line noted in the transverse colon with associated circumferential wall thickening. The transverse colon in the region the anastomosis is incorporated into the complex left upper quadrant process (see below). Vascular/Lymphatic: There is abdominal aortic atherosclerosis without aneurysm. There is no gastrohepatic or hepatoduodenal ligament lymphadenopathy. No intraperitoneal or retroperitoneal lymphadenopathy. No pelvic sidewall lymphadenopathy. Reproductive: The prostate gland and seminal vesicles have normal imaging features. Other: Complex process in the left upper quadrant involves the stomach, spleen, tail of pancreas, and distal transverse colon in the region of the anastomosis. On the previous study 3 months ago, there was only a 4.7 x 4.7 cm irregular soft tissue mass tethered between the stomach and the splenic hilum. This did track inferiorly and medially towards the tail of pancreas. On today's exam, there is a multi loculated, multicystic process in the left upper quadrant. This includes 2 separate rim enhancing collections in or on the wall of the greater curvature. 1 of these measures 3.7 x 4.7 cm (52/3) and the other more proximal lesion measures 2.4 x 3.6 cm. This latter collection appears to track into an irregular rim enhancing 4.4 x 3.3 cm collection dissecting into the splenic hilum. There is edema and inflammation in this region with fluid tracking throughout the left upper quadrant and around the spleen. Again this process tethers the proximal stomach, transverse colon, spleen, and pancreatic tail. Musculoskeletal: No worrisome lytic or sclerotic osseous abnormality. IMPRESSION: Interval development of a complex multicystic process in the left upper quadrant of the  abdomen. There are multiple rim enhancing complex fluid collections ranging in size from about 2 mm up to 5 mm. Two of these collections are along the greater curvature of the stomach and the more distal of the two may be intramural. The process dissects/insinuates into the splenic hilum and probably infiltrates the splenic parenchyma although compromise of the splenic vascular anatomy in the hilum with subsequent splenic infarct would also be a consideration. The distal transverse colon at the level of the anastomosis is retracted into this process. Imaging features likely reflect multifocal abscess. Compromise of the tethered colon as etiology for these changes would be a consideration. Given the appearance today, cystic metastatic disease is considered less likely given the marked interval change, although this possibility cannot be excluded. Electronically Signed   By: Misty Stanley M.D.   On: 12/24/2017 20:10   Ct Abdomen Pelvis W Contrast  Result Date: 12/24/2017 CLINICAL DATA:  Abdominal pain and fever. Abscess is suspected. Lethargy and anorexia. Recent diagnosis of colon cancer. EXAM: CT CHEST, ABDOMEN, AND PELVIS WITH CONTRAST TECHNIQUE: Multidetector CT imaging of the chest, abdomen and pelvis was performed following the standard protocol during bolus administration of intravenous contrast. CONTRAST:  1102m OMNIPAQUE IOHEXOL 300 MG/ML  SOLN COMPARISON:  Abdomen and pelvis CT 09/29/2017.  Chest CT 06/28/2017. FINDINGS: CT CHEST FINDINGS Cardiovascular: The heart size is normal. No substantial pericardial effusion. Coronary artery calcification is evident. Atherosclerotic calcification  is noted in the wall of the thoracic aorta. Mediastinum/Nodes: No mediastinal lymphadenopathy. There is no hilar lymphadenopathy. The esophagus has normal imaging features. There is no axillary lymphadenopathy. Lungs/Pleura: The central tracheobronchial airways are patent. 7 mm right middle lobe nodule is stable in the  interval. Dependent atelectasis noted in the lower lobes bilaterally. No edema or pleural effusion. Musculoskeletal: No worrisome lytic or sclerotic osseous abnormality. CT ABDOMEN PELVIS FINDINGS Hepatobiliary: No focal abnormality within the liver parenchyma. Gallbladder surgically absent. No intrahepatic or extrahepatic biliary dilation. Pancreas: Pancreas is atrophic. Surgical clips are seen in the region of the pancreatic tail. No dilatation of the main pancreatic duct. Spleen: Abnormal soft tissue again noted in the splenic hilum, substantially progressed since 09/29/2017. There is altered perfusion to the spleen, potentially related to sites of infiltrative disease or infarct. Adrenals/Urinary Tract: No adrenal nodule or mass. 7 mm nonobstructing stone identified lower pole right kidney. Left kidney unremarkable. No evidence for hydroureter. The urinary bladder appears normal for the degree of distention. Stomach/Bowel: Stomach is markedly distorted due to a heterogeneous complex left upper lobe process (see below). Duodenum is normally positioned as is the ligament of Treitz. Duodenal diverticuli evident. No small bowel wall thickening. No small bowel dilatation. The terminal ileum is normal. The appendix is normal. Anastomotic suture line noted in the transverse colon with associated circumferential wall thickening. The transverse colon in the region the anastomosis is incorporated into the complex left upper quadrant process (see below). Vascular/Lymphatic: There is abdominal aortic atherosclerosis without aneurysm. There is no gastrohepatic or hepatoduodenal ligament lymphadenopathy. No intraperitoneal or retroperitoneal lymphadenopathy. No pelvic sidewall lymphadenopathy. Reproductive: The prostate gland and seminal vesicles have normal imaging features. Other: Complex process in the left upper quadrant involves the stomach, spleen, tail of pancreas, and distal transverse colon in the region of the  anastomosis. On the previous study 3 months ago, there was only a 4.7 x 4.7 cm irregular soft tissue mass tethered between the stomach and the splenic hilum. This did track inferiorly and medially towards the tail of pancreas. On today's exam, there is a multi loculated, multicystic process in the left upper quadrant. This includes 2 separate rim enhancing collections in or on the wall of the greater curvature. 1 of these measures 3.7 x 4.7 cm (52/3) and the other more proximal lesion measures 2.4 x 3.6 cm. This latter collection appears to track into an irregular rim enhancing 4.4 x 3.3 cm collection dissecting into the splenic hilum. There is edema and inflammation in this region with fluid tracking throughout the left upper quadrant and around the spleen. Again this process tethers the proximal stomach, transverse colon, spleen, and pancreatic tail. Musculoskeletal: No worrisome lytic or sclerotic osseous abnormality. IMPRESSION: Interval development of a complex multicystic process in the left upper quadrant of the abdomen. There are multiple rim enhancing complex fluid collections ranging in size from about 2 mm up to 5 mm. Two of these collections are along the greater curvature of the stomach and the more distal of the two may be intramural. The process dissects/insinuates into the splenic hilum and probably infiltrates the splenic parenchyma although compromise of the splenic vascular anatomy in the hilum with subsequent splenic infarct would also be a consideration. The distal transverse colon at the level of the anastomosis is retracted into this process. Imaging features likely reflect multifocal abscess. Compromise of the tethered colon as etiology for these changes would be a consideration. Given the appearance today, cystic metastatic disease is considered  less likely given the marked interval change, although this possibility cannot be excluded. Electronically Signed   By: Misty Stanley M.D.   On:  12/24/2017 20:10   Dg Chest Port 1 View  Result Date: 12/24/2017 CLINICAL DATA:  Fever, malaise and weakness. EXAM: PORTABLE CHEST 1 VIEW COMPARISON:  Chest CT dated 06/28/2017. FINDINGS: Poor inspiration. Borderline enlarged heart. Tortuous aorta. Mild left basilar predominantly linear density. Minimal diffuse peribronchial thickening and accentuation of the interstitial markings. Thoracic spine degenerative changes. IMPRESSION: 1. Poor inspiration with mild left basilar atelectasis. Underlying pneumonia could not be absolutely excluded. 2. Minimal bronchitic changes. Electronically Signed   By: Claudie Revering M.D.   On: 12/24/2017 17:17   Ct Image Guided Fluid Drain By Catheter  Result Date: 12/27/2017 INDICATION: Left upper quadrant abscess EXAM: CT-GUIDED ABSCESS DRAIN MEDICATIONS: The patient is currently admitted to the hospital and receiving intravenous antibiotics. The antibiotics were administered within an appropriate time frame prior to the initiation of the procedure. ANESTHESIA/SEDATION: Fentanyl 25 mcg IV; Versed 1 mg IV Moderate Sedation Time:  14 minutes The patient was continuously monitored during the procedure by the interventional radiology nurse under my direct supervision. COMPLICATIONS: None immediate. PROCEDURE: Informed written consent was obtained from the patient after a thorough discussion of the procedural risks, benefits and alternatives. All questions were addressed. Maximal Sterile Barrier Technique was utilized including caps, mask, sterile gowns, sterile gloves, sterile drape, hand hygiene and skin antiseptic. A timeout was performed prior to the initiation of the procedure. Under CT guidance, an 18 gauge needle was inserted into the left upper quadrant abscess. It was removed over an Amplatz wire. Ten [place] drain was inserted over the wire. It was looped and string fixed in the abscess cavity. Reddish pus was aspirated. It was sewn to the skin. FINDINGS: Images document  placement of a 10 French drain into a left upper quadrant abscess. IMPRESSION: Successful placement of a 10 French drain into a left upper quadrant abscess. Electronically Signed   By: [name] M.D.   On: 12/27/2017 14:54       Subjective: Patient seen and examined the bedside this morning.  Remains comfortable.  Hemodynamically stable afebrile this morning.  Stable for discharge to home.  Discharge Exam: Vitals:   12/27/17 2353 12/28/17 0314  BP: 129/64 139/61  Pulse: 75 88  Resp: 17 18  Temp: 98.4 F (36.9 C) 98.3 F (36.8 C)  SpO2: 93% 95%   Vitals:   12/27/17 1938 12/27/17 1956 12/27/17 2353 12/28/17 0314  BP: 115/63 115/63 129/64 139/61  Pulse: 98 95 75 88  Resp: 19 18 17 18   Temp: 99.5 F (37.5 C) 99.8 F (37.7 C) 98.4 F (36.9 C) 98.3 F (36.8 C)  TempSrc: Oral Oral Oral Oral  SpO2: 95% 94% 93% 95%  Weight:      Height:        General: Pt is alert, awake, not in acute distress Cardiovascular: RRR, S1/S2 +, no rubs, no gallops Respiratory: CTA bilaterally, no wheezing, no rhonchi Abdominal: Soft, NT, ND, bowel sounds +, abdominal drain on the left upper quadrant draining serosanguineous fluid Extremities: no edema, no cyanosis    The results of significant diagnostics from this hospitalization (including imaging, microbiology, ancillary and laboratory) are listed below for reference.     Microbiology: Recent Results (from the past 240 hour(s))  Blood Culture (routine x 2)     Status: None (Preliminary result)   Collection Time: 12/24/17  4:53 PM  Result  Value Ref Range Status   Specimen Description BLOOD LEFT ANTECUBITAL  Final   Special Requests   Final    BOTTLES DRAWN AEROBIC AND ANAEROBIC Blood Culture adequate volume   Culture   Final    NO GROWTH 4 DAYS Performed at [facility], [address] [place], [place] [address]    Report Status PENDING  Incomplete  Blood Culture (routine x 2)     Status: None (Preliminary result)    Collection Time: 12/24/17  5:20 PM  Result Value Ref Range Status   Specimen Description BLOOD LEFT FOREARM  Final   Special Requests   Final    BOTTLES DRAWN AEROBIC AND ANAEROBIC Blood Culture adequate volume   Culture   Final    NO GROWTH 4 DAYS Performed at [facility], [place] [address] [place], [place] [address]    Report Status PENDING  Incomplete  Aerobic/Anaerobic Culture (surgical/deep wound)     Status: None (Preliminary result)   Collection Time: 12/27/17  2:50 PM  Result Value Ref Range Status   Specimen Description ABSCESS ABDOMEN  Final   Special Requests NONE  Final   Gram Stain   Final    ABUNDANT WBC PRESENT, PREDOMINANTLY PMN FEW GRAM POSITIVE RODS RARE GRAM POSITIVE COCCI RARE GRAM VARIABLE ROD Performed at [facility], [place] [address] [place], [place] [address]    Culture FEW GRAM NEGATIVE RODS  Final   Report Status PENDING  Incomplete     Labs: BNP (last 3 results) No results for input(s): BNP in the last 8760 hours. Basic Metabolic Panel: Recent Labs  Lab 12/24/17 1642 12/25/17 0238 12/26/17 0340 12/27/17 0328 12/28/17 0304  NA 131* 133* 134* 135 136  K 3.8 3.9 3.3* 3.4* 4.0  CL 94* 101 102 104 104  CO2 22 19* 19* 19* 23  GLUCOSE 162* 161* 126* 107* 114*  BUN 12 10 6* 6* 5*  CREATININE 1.29* 1.21 1.23 0.94 0.48*  CALCIUM 8.7* 7.5* 7.6* 7.5* 7.5*   Liver Function Tests: Recent Labs  Lab 12/24/17 1642 12/25/17 0238  AST 15 11*  ALT 13 9  ALKPHOS 75 63  BILITOT 1.0 0.7  PROT 7.8 5.9*  ALBUMIN 3.0* 2.1*   No results for input(s): LIPASE, AMYLASE in the last 168 hours. No results for input(s): AMMONIA in the last 168 hours. CBC: Recent Labs  Lab 12/24/17 1642 12/25/17 0238 12/26/17 0340 12/27/17 0328 12/28/17 0304  WBC 25.2* 20.3* 22.0* 18.8* 15.5*  NEUTROABS 20.4*  --  18.3* 15.4* 12.6*  HGB 11.8* 9.6* 9.9* 9.5* 9.2*  HCT 37.2* 30.4* 31.7* 30.4* 29.5*  MCV 80.2 79.8* 78.3* 78.6* 78.2*  PLT 493* 402* 409* 398 471*    Cardiac Enzymes: No results for input(s): CKTOTAL, CKMB, CKMBINDEX, TROPONINI in the last 168 hours. BNP: Invalid input(s): POCBNP CBG: No results for input(s): GLUCAP in the last 168 hours. D-Dimer No results for input(s): DDIMER in the last 72 hours. Hgb A1c No results for input(s): HGBA1C in the last 72 hours. Lipid Profile No results for input(s): CHOL, HDL, LDLCALC, TRIG, CHOLHDL, LDLDIRECT in the last 72 hours. Thyroid function studies No results for input(s): TSH, T4TOTAL, T3FREE, THYROIDAB in the last 72 hours.  Invalid input(s): FREET3 Anemia work up No results for input(s): VITAMINB12, FOLATE, FERRITIN, TIBC, IRON, RETICCTPCT in the last 72 hours. Urinalysis    Component Value Date/Time   COLORURINE YELLOW 12/24/2017 1950   APPEARANCEUR CLEAR 12/24/2017 1950   LABSPEC  1.012 12/24/2017 1950   PHURINE 6.0 12/24/2017 1950   GLUCOSEU NEGATIVE 12/24/2017 1950   HGBUR NEGATIVE 12/24/2017 1950   BILIRUBINUR NEGATIVE 12/24/2017 1950   KETONESUR 20 (A) 12/24/2017 1950   PROTEINUR NEGATIVE 12/24/2017 1950   UROBILINOGEN 0.2 09/09/2014 1638   NITRITE NEGATIVE 12/24/2017 1950   LEUKOCYTESUR NEGATIVE 12/24/2017 1950   Sepsis Labs Invalid input(s): PROCALCITONIN,  WBC,  LACTICIDVEN Microbiology Recent Results (from the past 240 hour(s))  Blood Culture (routine x 2)     Status: None (Preliminary result)   Collection Time: 12/24/17  4:53 PM  Result Value Ref Range Status   Specimen Description BLOOD LEFT ANTECUBITAL  Final   Special Requests   Final    BOTTLES DRAWN AEROBIC AND ANAEROBIC Blood Culture adequate volume   Culture   Final    NO GROWTH 4 DAYS Performed at [facility], [place] [address], [place], [place] [address]    Report Status PENDING  Incomplete  Blood Culture (routine x 2)     Status: None (Preliminary result)   Collection Time: 12/24/17  5:20 PM  Result Value Ref Range Status   Specimen Description BLOOD LEFT FOREARM  Final   Special Requests    Final    BOTTLES DRAWN AEROBIC AND ANAEROBIC Blood Culture adequate volume   Culture   Final    NO GROWTH 4 DAYS Performed at [facility], [place] [address], [place], [place] [address]    Report Status PENDING  Incomplete  Aerobic/Anaerobic Culture (surgical/deep wound)     Status: None (Preliminary result)   Collection Time: 12/27/17  2:50 PM  Result Value Ref Range Status   Specimen Description ABSCESS ABDOMEN  Final   Special Requests NONE  Final   Gram Stain   Final    ABUNDANT WBC PRESENT, PREDOMINANTLY PMN FEW GRAM POSITIVE RODS RARE GRAM POSITIVE COCCI RARE GRAM VARIABLE ROD Performed at [facility], [place] [address], [place], [place] [address]    Culture FEW GRAM NEGATIVE RODS  Final   Report Status PENDING  Incomplete    Please note: You were cared for by a hospitalist during your hospital stay. Once you are discharged, your primary care physician will handle any further medical issues. Please note that NO REFILLS for any discharge medications will be authorized once you are discharged, as it is imperative that you return to your primary care physician (or establish a relationship with a primary care physician if you do not have one) for your post hospital discharge needs so that they can reassess your need for medications and monitor your lab values.    Time coordinating discharge: 40 minutes  SIGNED:   Shelly Coss, MD  [facility] 12/28/2017, 11:32 AM Pager [phone]  If 7PM-7AM, please contact night-coverage [email or web address] Password [email or web address]

## 2017-12-28 NOTE — Telephone Encounter (Signed)
Patient called from hospital room asking for Dr. Benay Spice to assist and getting him discharged today. He wants to go home today. Per Dr. Benay Spice, this is the decision of surgery and Dr. Tawanna Solo. Sent message to Dr. Tawanna Solo to make him aware of the patient's call to office.

## 2017-12-28 NOTE — Progress Notes (Signed)
IP PROGRESS NOTE  Subjective:   Cody Suarez is well-known to me with a history of metastatic colon cancer.  He is currently followed with observation after undergoing biopsy of a left domino mass in October. He reports the acute onset of malaise and anorexia beginning 12/19/2017.  He was referred to the emergency room on 12/24/2017 by his primary physician with a fever.  He was admitted with evidence of sepsis. A CT of the abdomen and pelvis on 12/24/2017 noted a complex process in the left upper quadrant with involvement of the stomach, spleen, tail the pancreas, and transverse colon.  The area is multiloculated and cystic.  Rim-enhancing collections were noted.  No focal liver abnormality. He was admitted and placed on IV antibiotics.  He underwent placement of a left upper quadrant abscess cavity drain on 12/27/2017.  "Reddish "pus was aspirated. He reports feeling much better.  He would like to go home. Objective: Vital signs in last 24 hours: Blood pressure 139/61, pulse 88, temperature 98.3 F (36.8 C), temperature source Oral, resp. rate 18, height 5' 10"  (1.778 m), weight 177 lb 11.1 oz (80.6 kg), SpO2 95 %.  Intake/Output from previous day: 12/23 0701 - 12/24 0700 In: 3795.2 [P.O.:240; I.V.:3035.5; IV Piggyback:519.7] Out: 275 [Drains:275]  Physical Exam:  HEENT: Erythema over the tongue and buccal mucosa, no thrush or ulcers Lungs: Decreased breath sounds with inspiratory rhonchi at the lower posterior chest bilaterally, no respiratory distress Cardiac: Regular rate and rhythm Abdomen: No hepatosplenomegaly, no mass, left upper quadrant drain with a gauze dressing Extremities: No leg edema   Portacath/PICC-without erythema  Lab Results: Recent Labs    12/27/17 0328 12/28/17 0304  WBC 18.8* 15.5*  HGB 9.5* 9.2*  HCT 30.4* 29.5*  PLT 398 471*    BMET Recent Labs    12/27/17 0328 12/28/17 0304  NA 135 136  K 3.4* 4.0  CL 104 104  CO2 19* 23  GLUCOSE 107* 114*   BUN 6* 5*  CREATININE 0.94 0.48*  CALCIUM 7.5* 7.5*    Lab Results  Component Value Date   CEA1 3.63 09/20/2017    Studies/Results: Ct Image Guided Fluid Drain By Catheter  Result Date: 12/27/2017 INDICATION: Left upper quadrant abscess EXAM: CT-GUIDED ABSCESS DRAIN MEDICATIONS: The patient is currently admitted to the hospital and receiving intravenous antibiotics. The antibiotics were administered within an appropriate time frame prior to the initiation of the procedure. ANESTHESIA/SEDATION: Fentanyl 25 mcg IV; Versed 1 mg IV Moderate Sedation Time:  14 minutes The patient was continuously monitored during the procedure by the interventional radiology nurse under my direct supervision. COMPLICATIONS: None immediate. PROCEDURE: Informed written consent was obtained from the patient after a thorough discussion of the procedural risks, benefits and alternatives. All questions were addressed. Maximal Sterile Barrier Technique was utilized including caps, mask, sterile gowns, sterile gloves, sterile drape, hand hygiene and skin antiseptic. A timeout was performed prior to the initiation of the procedure. Under CT guidance, an 18 gauge needle was inserted into the left upper quadrant abscess. It was removed over an Amplatz wire. Ten [place] drain was inserted over the wire. It was looped and string fixed in the abscess cavity. Reddish pus was aspirated. It was sewn to the skin. FINDINGS: Images document placement of a 10 French drain into a left upper quadrant abscess. IMPRESSION: Successful placement of a 10 French drain into a left upper quadrant abscess. Electronically Signed   By: [name] M.D.   On: 12/27/2017 14:54  Medications: I have reviewed the patient's current medications.  Assessment/Plan:  1. 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 Negative for a BRAF mutation  Tumor invaded through the muscularis propria into pericolonic fatty tissue and involved the attached omentum.   Cycle 1 adjuvant Xeloda 05/28/2013.   Cycle 2 adjuvant Xeloda 06/18/2013.   Cycle 3 adjuvant Xeloda 07/09/2013.   Cycle 4 adjuvant Xeloda 07/30/2013.   Cycle 5 adjuvant Xeloda 08/20/2013.  Cycle 6 adjuvant Xeloda 09/11/2013.  cycle 7 adjuvant Xeloda 10/02/2013  Cycle 8 adjuvant Xeloda 10/23/2013  CTs of the chest, abdomen, and pelvis on 07/18/2014-negative for recurrent colon cancer   CTs abdomen/pelvis 09/06/2014 showed acute cholecystitis.  Colonoscopy 08/02/2015-chronic colitis, no malignancy  CTs 09/17/2015, new mass near the tail the pancreas, stable lung nodules  Biopsy mesenteric mass 10/04/2015 with metastatic adenocarcinoma consistent with colorectal primary  PET scan 10/10/2015 with soft tissue thickening within the peritoneal space of the left upper quadrant with SUV Max 7.9; no additional sites of peritoneal nodularity to suggest metastasis; hypermetabolic ill-defined nodule in the left lower lobe favored inflammatory.  Resection of the left abdominal mesenteric mass/tail the pancreas on 11/05/2015 with the pathology confirming metastatic colon cancer, positive "serosal" margin  CTs 07/29/2016-no evidence of metastatic disease, stable small lung nodules, decreased size of pancreatic resection bed fluid/mass  CTs 06/28/2017-decreased fluid and increased soft tissue at the area of the distal pancreatectomy with a few foci of gas  CT 09/29/2017-enlargement of left upper quadrant soft tissue mass between the stomach, spleen, and pancreas tail, stable nodules at the lung bases  CT biopsy of the upper quadrant mass 10/19/2017-metastatic colon cancer, MSS, tumor mutation burden 1, no KRAS, BRAF,  or NRAS mutation 2. Ulcerative colitis-extensive chronic active ulcerative colitis was noted  on the colon resection specimen 03/28/2013.  Followed by Dr.Magod 3. Hypertension.  4. History of microcytic anemia-likely iron deficiency, unable to tolerate oral iron. Improved. 5. Possible area of cecal wall thickening noted on abdominal CT 03/20/2013. 6. Family history of colon cancer (maternal grandfather died in his 61s with colon cancer). 7. Bilateral calf and low anterior leg pain. Bilateral lower extremity venous duplex negative for DVT 07/14/2013. Right ABI with moderate, borderline severe arterial insufficiency; left ABI suggestive of moderate arterial insufficiency. He was referred to vascular. 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 TEE showed thrombus in the descending aorta. He underwent right popliteal to peroneal artery bypass graft, thrombectomy anterior tibialis and attempted thrombectomy tibioperoneal trunk and posterior tibialis on 08/11/2013. Right calf pain 09/26/2013 with findings of occlusion of right popliteal to peroneal bypass status post thrombolysis. Now on anticoagulation with Xarelto.  recurrent pain and ulceration at the right foot, status post a popliteal to posterior tibial artery bypass using a cephalic vein graft on 71/69/6789 8. Status post laparoscopic cholecystectomy 09/08/2014 9. Anorexia following the mesenteric mass/pancreas tail resection-improved 10. Abdominal abscess November 2017-pancreas "cysts "drainage catheter placed 11/26/2015; CT 02/04/2016 with no change to slight decreasein size of residual pancreatic tail fluid collection.Drainage catheter removed approximately early March 2018  CT 02/20/2016-new small left pleural effusion, increased size of the surgical bed fluid collection, indeterminate pulmonary nodules  CT 07/29/2016-decreased fluid/soft tissue density near the pancreas is no evidence of metastatic disease, stable subcentimeter lung nodules 11.  Admission 12/24/2017 with sepsis syndrome, CT abdomen pelvis  multi loculated abscess in the left abdomen,  status post placement of abscess drain 12/27/2017   Cody Suarez was admitted 12/24/2017 with an acute illness characterized by fever and generalized weakness.  He was found to have a multiloculated cystic process in the left abdomen consistent with an abscess.  He has undergone placement of an abscess drainage catheter and is maintained on intravenous antibiotics.  He has biopsy-proven recurrence of colon cancer involving a left abdomen mass.  It is possible the abscess is related to the metastatic mesenteric mass.  The infection could be related to the biopsy procedure performed in October or the previous pancreas resection/abscess.  The plan was to begin systemic therapy for metastatic colon cancer within the next few months.  He is not a candidate for chemotherapy until the infection has been further treated.  Recommendations: 1.  Antibiotics, management of abscess drain per surgery and interventional radiology 2.  Follow-up as scheduled at the Cancer center 01/06/2018 3.  Plan to consider initiating systemic therapy for metastatic colon cancer if the infection resolves with antibiotic therapy. 4.  Please call Oncology as needed while he is hospitalized     LOS: 4 days   Betsy Coder, MD   12/28/2017, 6:55 AM

## 2017-12-29 LAB — CULTURE, BLOOD (ROUTINE X 2)
Culture: NO GROWTH
Culture: NO GROWTH
Special Requests: ADEQUATE
Special Requests: ADEQUATE

## 2017-12-30 ENCOUNTER — Telehealth: Payer: Self-pay

## 2017-12-30 ENCOUNTER — Other Ambulatory Visit: Payer: Self-pay | Admitting: General Practice

## 2017-12-30 MED ORDER — CEFDINIR 300 MG PO CAPS: 300.0000 mg | ORAL_CAPSULE | Freq: Two times a day (BID) | ORAL | 0 refills | Status: DC

## 2017-12-30 NOTE — Telephone Encounter (Signed)
Transition Care Management Follow-up Telephone Call  Admit date: 12/24/2017 Discharge date: 12/28/2017 Diagnosis: Sepsis, Gram negative    How have you been since you were released from the hospital? "I'm better than I was"   Do you understand why you were in the hospital? yes   Do you understand the discharge instructions? yes   Where were you discharged to? Home. Wife with patient.    Items Reviewed:  Medications reviewed: no, advised to bring meds to appt. Started antibiotic.  Allergies reviewed: yes  Dietary changes reviewed: yes  Referrals reviewed: yes   Functional Questionnaire:   Activities of Daily Living (ADLs):   He states they are independent in the following: ambulation, bathing and hygiene, feeding, continence, grooming, toileting and dressing States they require assistance with the following: None.    Any transportation issues/concerns?: no   Any patient concerns? No. Pt discharged with abdominal drain. States no issues, he/wife are managing.    Confirmed importance and date/time of follow-up visits scheduled yes  Provider Appointment booked with PCP 01/07/2018.   Confirmed with patient if condition begins to worsen call PCP or go to the ER.  Patient was given the office number and encouraged to call back with question or concerns.  : yes

## 2018-01-01 LAB — AEROBIC/ANAEROBIC CULTURE W GRAM STAIN (SURGICAL/DEEP WOUND)

## 2018-01-03 ENCOUNTER — Other Ambulatory Visit: Payer: Self-pay | Admitting: Interventional Radiology

## 2018-01-03 ENCOUNTER — Other Ambulatory Visit (HOSPITAL_COMMUNITY): Payer: Self-pay | Admitting: Surgery

## 2018-01-03 ENCOUNTER — Ambulatory Visit (HOSPITAL_COMMUNITY): Payer: Medicare Other

## 2018-01-03 ENCOUNTER — Other Ambulatory Visit: Payer: Self-pay | Admitting: Surgery

## 2018-01-03 ENCOUNTER — Other Ambulatory Visit: Payer: Medicare Other

## 2018-01-03 DIAGNOSIS — K651 Peritoneal abscess: Secondary | ICD-10-CM

## 2018-01-04 ENCOUNTER — Encounter (HOSPITAL_COMMUNITY): Payer: Self-pay | Admitting: Diagnostic Radiology

## 2018-01-04 ENCOUNTER — Ambulatory Visit (HOSPITAL_COMMUNITY)
Admission: RE | Admit: 2018-01-04 | Discharge: 2018-01-04 | Disposition: A | Discharge status: Home / Self Care | Payer: Medicare Other | Source: Ambulatory Visit | Attending: Radiology | Admitting: Radiology

## 2018-01-04 ENCOUNTER — Ambulatory Visit (HOSPITAL_COMMUNITY)
Admission: RE | Admit: 2018-01-04 | Discharge: 2018-01-04 | Disposition: A | Discharge status: Home / Self Care | Payer: Medicare Other | Source: Ambulatory Visit | Attending: Interventional Radiology | Admitting: Interventional Radiology

## 2018-01-04 DIAGNOSIS — K651 Peritoneal abscess: Secondary | ICD-10-CM

## 2018-01-04 DIAGNOSIS — Z4803 Encounter for change or removal of drains: Secondary | ICD-10-CM | POA: Diagnosis not present

## 2018-01-04 DIAGNOSIS — L02211 Cutaneous abscess of abdominal wall: Secondary | ICD-10-CM | POA: Diagnosis not present

## 2018-01-04 HISTORY — PX: IR SINUS/FIST TUBE CHK-NON GI: IMG673

## 2018-01-04 MED ORDER — IOHEXOL 300 MG/ML  SOLN
100.0000 mL | Freq: Once | INTRAMUSCULAR | Status: AC | PRN
  Administered 2018-01-04: 100 mL via INTRAVENOUS

## 2018-01-04 MED ORDER — IOPAMIDOL (ISOVUE-300) INJECTION 61%
INTRAVENOUS | Status: AC
  Administered 2018-01-04: 4 mL
  Filled 2018-01-04: qty 50

## 2018-01-04 NOTE — Progress Notes (Signed)
Chief Complaint: Patient was seen in consultation today for routine follow up of LUQ abscess drain placed 12/27/17 by Dr. Barbie Banner  Referring Physician(s): Dr. Tawanna Solo  Supervising Physician: Markus Daft  History of Present Illness: Cody Suarez is a 72 y.o. male with past medical history significant for HTN, PVD, DVT, anemia, ulcerative colitis, colon cancer followed by Dr. Benay Spice and nephrolithiasis who presented to [facility] ED via EMS from his PCPs office on 12/24/17. He reported generalized weakness, myalgias, fatigue, intermittent mild abdominal pain and poor appetite x 1 week. Work up was notable for leukocytosis (25.2) on chronic steroid therapy,  INR 3.45, creatinine 1.29, lactic acid 1.92, tmax 100.9 and tachycardia. Influenza screening and blood cultures were negative. CT chest/abdomen/pelvis with contrast showed interval development of a complex multicystic process in the LUQ of the abdomen, multiple rim enhancing complex fluid collections ranging in size from 2 mm to 5 mm likely reflecting multifocal abscess. Consult was placed to IR for aspiration and drain placement which was performed successfully on 12/27/17 by Dr. Barbie Banner.   Patient was discharged to home on 12/28/17 with instructions to continue PO Augmentin x 7 days and plans to follow up with oncology and IR. Patient presents today for routine IR follow up including CT/possible drain injection.  Patient reports he is scheduled to see Dr. Benay Spice on Thursday, he has been doing well since discharge from the hospital. He continues on oral antibiotics although he states he is no longer taking Augmentin and is now taking a different antibiotic that he cannot remember the name of. He denies any fevers, chills, abdominal pain, nausea or vomiting. He states that he and his wife have not had any issues with flushing the drain, output has been between 0 and 5 cc of clear liquid daily. He does report that the tubing which is connected to the  JP bulb became detached and had to be reattached the other day.  Past Medical History:  Diagnosis Date  . Anemia of chronic disease 2015   "related to cancer tx" (08/09/2013)  . Blood clot in vein 2015  . Colon cancer ([place]) 03/2013  . History of kidney stones    x 2passed them both  . Hypertension   . Peripheral vascular disease ([place])   . Ulcerative colitis ([place])    history    Past Surgical History:  Procedure Laterality Date  . ABDOMINAL AORTAGRAM N/A 08/09/2013   Procedure: ABDOMINAL Maxcine Ham;  Surgeon: [name], MD;  Location: [facility];  Service: Cardiovascular;  Laterality: N/A;  . CHOLECYSTECTOMY N/A 09/08/2014   Procedure: LAPAROSCOPIC CHOLECYSTECTOMY WITH INTRAOPERATIVE CHOLANGIOGRAM;  Surgeon: Armandina Gemma, MD;  Location: [facility];  Service: General;  Laterality: N/A;  . COLON SURGERY  02/2013  . COLON SURGERY  10/2015  . FEMORAL-POPLITEAL BYPASS GRAFT Right 08/11/2013   Procedure:   RIGHT - POPLITEAL TO PERONEAL ARTERY BYPASS GRAFT  WITH NONREVERSED SAPHENOUS VEIN GRAFT,tHROMBECTOMY ANTERIOR TIBIALIS,ATTEMPTED THROMBECTOMY TIBIO-PERONEAL TRUNK AND POSTERIOR TIBIALIS, INTRAOPERATIVE ARTERIOGRAM.;  Surgeon: Mal Misty, MD;  Location: [place];  Service: Vascular;  Laterality: Right;  . FEMORAL-TIBIAL BYPASS GRAFT Right 11/17/2013   Procedure: BYPASS GRAFT RIGHT ABOVE KNEE POPLITEAL TO POSTERIOR TIBIAL ARTERY USING RIGHT NON-REVERSED CEPHALIC VEIN;  Surgeon: Mal Misty, MD;  Location: [place];  Service: Vascular;  Laterality: Right;  . HERNIA REPAIR  ~ 1995; 03/2013   UHR  . INTRAOPERATIVE ARTERIOGRAM Right 11/17/2013   Procedure: INTRA OPERATIVE ARTERIOGRAM;  Surgeon: Mal Misty, MD;  Location: [facility];  Service: Vascular;  Laterality: Right;  . IR GENERIC HISTORICAL  12/11/2015   IR RADIOLOGIST EVAL & MGMT 12/11/2015 Arne Cleveland, MD [facility]  . IR GENERIC HISTORICAL  12/24/2015   IR RADIOLOGIST EVAL & MGMT 12/24/2015 Jacqulynn Cadet, MD [facility]  .  IR GENERIC HISTORICAL  01/21/2016   IR RADIOLOGIST EVAL & MGMT 01/21/2016 [name], MD [facility]  . IR GENERIC HISTORICAL  02/04/2016   IR RADIOLOGIST EVAL & MGMT 02/04/2016 Sandi Mariscal, MD [facility]  . IR GENERIC HISTORICAL  02/12/2016   IR CATHETER TUBE CHANGE 02/12/2016 Corrie Mckusick, DO [facility]  . IR GENERIC HISTORICAL  02/20/2016   IR RADIOLOGIST EVAL & MGMT 02/20/2016 Markus Daft, MD [facility]  . LAPAROSCOPIC RIGHT HEMI COLECTOMY N/A 03/28/2013   Procedure: LAPAROSCOPIC ASSISTED HEMI COLECTOMY;  Surgeon: Pedro Earls, MD;  Location: [facility];  Service: General;  Laterality: N/A;  . LAPAROSCOPY N/A 11/05/2015   Procedure: LAPAROSCOPY DIAGNOSTIC;  Surgeon: [name], MD;  Location: [facility];  Service: General;  Laterality: N/A;  . LAPAROTOMY N/A 11/05/2015   Procedure: EXPLORATORY LAPAROTOMY, resection of mass at tail of pancreas;  Surgeon: [name], MD;  Location: [facility];  Service: General;  Laterality: N/A;  . LOWER EXTREMITY ANGIOGRAM Bilateral 08/09/2013  . TEE WITHOUT CARDIOVERSION N/A 08/10/2013   Procedure: TRANSESOPHAGEAL ECHOCARDIOGRAM (TEE);  Surgeon: Candee Furbish, MD;  Location[facility];  Service: Cardiovascular;  Laterality: N/A;  . TONSILLECTOMY  ~ 1950  . [place]; 03/2013  . VASECTOMY    . VEIN HARVEST Right 11/17/2013   Procedure: HARVEST OF RIGHT UPPER EXTREMITY CEPHALIC VEIN;  Surgeon: Mal Misty, MD;  Location: [place];  Service: Vascular;  Laterality: Right;    Allergies: Amlodipine and Gabapentin  Medications: Prior to Admission medications   Medication Sig Start Date End Date Taking? Authorizing Provider  acetaminophen (TYLENOL) 500 MG tablet Take 1,000 mg by mouth daily as needed for headache (pain).     [provider]  cefdinir (OMNICEF) 300 MG capsule Take 1 capsule (300 mg total) by mouth 2 (two) times daily. 12/30/17   Midge Minium, MD  Coenzyme Q10 (COQ10 PO) Take 1 capsule by mouth  daily.    [provider]  diphenhydramine-acetaminophen (TYLENOL PM) 25-500 MG TABS tablet Take 2 tablets by mouth at bedtime as needed (sleep).    [provider]  Homeopathic Products ([place] EX) Apply 1 application topically daily as needed (calf pain).    [provider]  Liniments (BLUE-EMU SUPER STRENGTH EX) Apply 1 application topically daily as needed (knee pain).    [provider]  losartan (COZAAR) 100 MG tablet Take 0.5 tablets (50 mg total) by mouth daily. 10/21/17   [name], MD  Multiple Vitamin (MULTIVITAMIN WITH MINERALS) TABS tablet Take 1 tablet by mouth daily.    [provider]  oxyCODONE-acetaminophen (PERCOCET/ROXICET) 5-325 MG tablet Take 1-2 tablets by mouth every 4 (four) hours as needed for moderate pain or severe pain. 12/28/17   Shelly Coss, MD  predniSONE (DELTASONE) 5 MG tablet Take 5 mg by mouth daily.  03/29/17   [provider]  rivaroxaban (XARELTO) 20 MG TABS tablet Take 1 tablet (20 mg total) by mouth daily with breakfast. Lot [id number]  06/2019 x2 11/26/17   [name], MD  spironolactone (ALDACTONE) 25 MG tablet TAKE 1 TABLET BY MOUTH ONCE DAILY Patient taking  differently: Take 25 mg by mouth daily.  12/08/17   [name], MD  sulfaSALAzine (AZULFIDINE) 500 MG EC tablet Take 1,500 mg by mouth daily.  04/23/17   [provider]     Family History  Problem Relation Age of Onset  . Heart disease Mother   . Emphysema Father     Social History   Socioeconomic History  . Marital status: Married    Spouse name: Not on file  . Number of children: Not on file  . Years of education: Not on file  . Highest education level: Not on file  Occupational History  . Not on file  Social Needs  . Financial resource strain: Not on file  . Food insecurity:    Worry: Not on file    Inability: Not on file  . Transportation needs:    Medical: Not on file    Non-medical:  Not on file  Tobacco Use  . Smoking status: Never Smoker  . Smokeless tobacco: Never Used  . Tobacco comment: states his father died of emphysema from smoking, he did not want to be like that  Substance and Sexual Activity  . Alcohol use: No  . Drug use: No  . Sexual activity: Yes  Lifestyle  . Physical activity:    Days per week: Patient refused    Minutes per session: Patient refused  . Stress: To some extent  Relationships  . Social connections:    Talks on phone: Patient refused    Gets together: Patient refused    Attends religious service: Patient refused    Active member of club or organization: Patient refused    Attends meetings of clubs or organizations: Patient refused    Relationship status: Patient refused  Other Topics Concern  . Not on file  Social History Narrative  . Not on file    Review of Systems: A 12 point ROS discussed and pertinent positives are indicated in the HPI above.  All other systems are negative.  Review of Systems  Constitutional: Negative for appetite change, chills and fever.  Respiratory: Negative for shortness of breath.   Cardiovascular: Negative for chest pain.  Gastrointestinal: Negative for abdominal pain, nausea and vomiting.  Skin: Negative for wound.    Vital Signs: There were no vitals taken for this visit.  Physical Exam Constitutional:      Appearance: Normal appearance.     Comments: Wife at bedside during exam.  HENT:     Head: Normocephalic and atraumatic.  Abdominal:     General: There is no distension.     Palpations: Abdomen is soft.     Tenderness: There is no abdominal tenderness.     Comments: (+) LUQ drain to JP with scant serous OP in bulb. Insertion site unremarkable.   Skin:    General: Skin is warm and dry.  Neurological:     Mental Status: He is alert. Mental status is at baseline.     Imaging: Ct Chest W Contrast  Result Date: 12/24/2017 CLINICAL DATA:  Abdominal pain and fever. Abscess is  suspected. Lethargy and anorexia. Recent diagnosis of colon cancer. EXAM: CT CHEST, ABDOMEN, AND PELVIS WITH CONTRAST TECHNIQUE: Multidetector CT imaging of the chest, abdomen and pelvis was performed following the standard protocol during bolus administration of intravenous contrast. CONTRAST:  157m OMNIPAQUE IOHEXOL 300 MG/ML  SOLN COMPARISON:  Abdomen and pelvis CT 09/29/2017.  Chest CT 06/28/2017. FINDINGS: CT CHEST FINDINGS Cardiovascular: The heart size is normal. No  substantial pericardial effusion. Coronary artery calcification is evident. Atherosclerotic calcification is noted in the wall of the thoracic aorta. Mediastinum/Nodes: No mediastinal lymphadenopathy. There is no hilar lymphadenopathy. The esophagus has normal imaging features. There is no axillary lymphadenopathy. Lungs/Pleura: The central tracheobronchial airways are patent. 7 mm right middle lobe nodule is stable in the interval. Dependent atelectasis noted in the lower lobes bilaterally. No edema or pleural effusion. Musculoskeletal: No worrisome lytic or sclerotic osseous abnormality. CT ABDOMEN PELVIS FINDINGS Hepatobiliary: No focal abnormality within the liver parenchyma. Gallbladder surgically absent. No intrahepatic or extrahepatic biliary dilation. Pancreas: Pancreas is atrophic. Surgical clips are seen in the region of the pancreatic tail. No dilatation of the main pancreatic duct. Spleen: Abnormal soft tissue again noted in the splenic hilum, substantially progressed since 09/29/2017. There is altered perfusion to the spleen, potentially related to sites of infiltrative disease or infarct. Adrenals/Urinary Tract: No adrenal nodule or mass. 7 mm nonobstructing stone identified lower pole right kidney. Left kidney unremarkable. No evidence for hydroureter. The urinary bladder appears normal for the degree of distention. Stomach/Bowel: Stomach is markedly distorted due to a heterogeneous complex left upper lobe process (see below).  Duodenum is normally positioned as is the ligament of Treitz. Duodenal diverticuli evident. No small bowel wall thickening. No small bowel dilatation. The terminal ileum is normal. The appendix is normal. Anastomotic suture line noted in the transverse colon with associated circumferential wall thickening. The transverse colon in the region the anastomosis is incorporated into the complex left upper quadrant process (see below). Vascular/Lymphatic: There is abdominal aortic atherosclerosis without aneurysm. There is no gastrohepatic or hepatoduodenal ligament lymphadenopathy. No intraperitoneal or retroperitoneal lymphadenopathy. No pelvic sidewall lymphadenopathy. Reproductive: The prostate gland and seminal vesicles have normal imaging features. Other: Complex process in the left upper quadrant involves the stomach, spleen, tail of pancreas, and distal transverse colon in the region of the anastomosis. On the previous study 3 months ago, there was only a 4.7 x 4.7 cm irregular soft tissue mass tethered between the stomach and the splenic hilum. This did track inferiorly and medially towards the tail of pancreas. On today's exam, there is a multi loculated, multicystic process in the left upper quadrant. This includes 2 separate rim enhancing collections in or on the wall of the greater curvature. 1 of these measures 3.7 x 4.7 cm (52/3) and the other more proximal lesion measures 2.4 x 3.6 cm. This latter collection appears to track into an irregular rim enhancing 4.4 x 3.3 cm collection dissecting into the splenic hilum. There is edema and inflammation in this region with fluid tracking throughout the left upper quadrant and around the spleen. Again this process tethers the proximal stomach, transverse colon, spleen, and pancreatic tail. Musculoskeletal: No worrisome lytic or sclerotic osseous abnormality. IMPRESSION: Interval development of a complex multicystic process in the left upper quadrant of the abdomen.  There are multiple rim enhancing complex fluid collections ranging in size from about 2 mm up to 5 mm. Two of these collections are along the greater curvature of the stomach and the more distal of the two may be intramural. The process dissects/insinuates into the splenic hilum and probably infiltrates the splenic parenchyma although compromise of the splenic vascular anatomy in the hilum with subsequent splenic infarct would also be a consideration. The distal transverse colon at the level of the anastomosis is retracted into this process. Imaging features likely reflect multifocal abscess. Compromise of the tethered colon as etiology for these changes would be a  consideration. Given the appearance today, cystic metastatic disease is considered less likely given the marked interval change, although this possibility cannot be excluded. Electronically Signed   By: Misty Stanley M.D.   On: 12/24/2017 20:10   Ct Abdomen Pelvis W Contrast  Result Date: 12/24/2017 CLINICAL DATA:  Abdominal pain and fever. Abscess is suspected. Lethargy and anorexia. Recent diagnosis of colon cancer. EXAM: CT CHEST, ABDOMEN, AND PELVIS WITH CONTRAST TECHNIQUE: Multidetector CT imaging of the chest, abdomen and pelvis was performed following the standard protocol during bolus administration of intravenous contrast. CONTRAST:  15m OMNIPAQUE IOHEXOL 300 MG/ML  SOLN COMPARISON:  Abdomen and pelvis CT 09/29/2017.  Chest CT 06/28/2017. FINDINGS: CT CHEST FINDINGS Cardiovascular: The heart size is normal. No substantial pericardial effusion. Coronary artery calcification is evident. Atherosclerotic calcification is noted in the wall of the thoracic aorta. Mediastinum/Nodes: No mediastinal lymphadenopathy. There is no hilar lymphadenopathy. The esophagus has normal imaging features. There is no axillary lymphadenopathy. Lungs/Pleura: The central tracheobronchial airways are patent. 7 mm right middle lobe nodule is stable in the interval.  Dependent atelectasis noted in the lower lobes bilaterally. No edema or pleural effusion. Musculoskeletal: No worrisome lytic or sclerotic osseous abnormality. CT ABDOMEN PELVIS FINDINGS Hepatobiliary: No focal abnormality within the liver parenchyma. Gallbladder surgically absent. No intrahepatic or extrahepatic biliary dilation. Pancreas: Pancreas is atrophic. Surgical clips are seen in the region of the pancreatic tail. No dilatation of the main pancreatic duct. Spleen: Abnormal soft tissue again noted in the splenic hilum, substantially progressed since 09/29/2017. There is altered perfusion to the spleen, potentially related to sites of infiltrative disease or infarct. Adrenals/Urinary Tract: No adrenal nodule or mass. 7 mm nonobstructing stone identified lower pole right kidney. Left kidney unremarkable. No evidence for hydroureter. The urinary bladder appears normal for the degree of distention. Stomach/Bowel: Stomach is markedly distorted due to a heterogeneous complex left upper lobe process (see below). Duodenum is normally positioned as is the ligament of Treitz. Duodenal diverticuli evident. No small bowel wall thickening. No small bowel dilatation. The terminal ileum is normal. The appendix is normal. Anastomotic suture line noted in the transverse colon with associated circumferential wall thickening. The transverse colon in the region the anastomosis is incorporated into the complex left upper quadrant process (see below). Vascular/Lymphatic: There is abdominal aortic atherosclerosis without aneurysm. There is no gastrohepatic or hepatoduodenal ligament lymphadenopathy. No intraperitoneal or retroperitoneal lymphadenopathy. No pelvic sidewall lymphadenopathy. Reproductive: The prostate gland and seminal vesicles have normal imaging features. Other: Complex process in the left upper quadrant involves the stomach, spleen, tail of pancreas, and distal transverse colon in the region of the anastomosis. On  the previous study 3 months ago, there was only a 4.7 x 4.7 cm irregular soft tissue mass tethered between the stomach and the splenic hilum. This did track inferiorly and medially towards the tail of pancreas. On today's exam, there is a multi loculated, multicystic process in the left upper quadrant. This includes 2 separate rim enhancing collections in or on the wall of the greater curvature. 1 of these measures 3.7 x 4.7 cm (52/3) and the other more proximal lesion measures 2.4 x 3.6 cm. This latter collection appears to track into an irregular rim enhancing 4.4 x 3.3 cm collection dissecting into the splenic hilum. There is edema and inflammation in this region with fluid tracking throughout the left upper quadrant and around the spleen. Again this process tethers the proximal stomach, transverse colon, spleen, and pancreatic tail. Musculoskeletal: No worrisome lytic  or sclerotic osseous abnormality. IMPRESSION: Interval development of a complex multicystic process in the left upper quadrant of the abdomen. There are multiple rim enhancing complex fluid collections ranging in size from about 2 mm up to 5 mm. Two of these collections are along the greater curvature of the stomach and the more distal of the two may be intramural. The process dissects/insinuates into the splenic hilum and probably infiltrates the splenic parenchyma although compromise of the splenic vascular anatomy in the hilum with subsequent splenic infarct would also be a consideration. The distal transverse colon at the level of the anastomosis is retracted into this process. Imaging features likely reflect multifocal abscess. Compromise of the tethered colon as etiology for these changes would be a consideration. Given the appearance today, cystic metastatic disease is considered less likely given the marked interval change, although this possibility cannot be excluded. Electronically Signed   By: Misty Stanley M.D.   On: 12/24/2017 20:10    Dg Chest Port 1 View  Result Date: 12/24/2017 CLINICAL DATA:  Fever, malaise and weakness. EXAM: PORTABLE CHEST 1 VIEW COMPARISON:  Chest CT dated 06/28/2017. FINDINGS: Poor inspiration. Borderline enlarged heart. Tortuous aorta. Mild left basilar predominantly linear density. Minimal diffuse peribronchial thickening and accentuation of the interstitial markings. Thoracic spine degenerative changes. IMPRESSION: 1. Poor inspiration with mild left basilar atelectasis. Underlying pneumonia could not be absolutely excluded. 2. Minimal bronchitic changes. Electronically Signed   By: Claudie Revering M.D.   On: 12/24/2017 17:17   Ct Image Guided Fluid Drain By Catheter  Result Date: 12/27/2017 INDICATION: Left upper quadrant abscess EXAM: CT-GUIDED ABSCESS DRAIN MEDICATIONS: The patient is currently admitted to the hospital and receiving intravenous antibiotics. The antibiotics were administered within an appropriate time frame prior to the initiation of the procedure. ANESTHESIA/SEDATION: Fentanyl 25 mcg IV; Versed 1 mg IV Moderate Sedation Time:  14 minutes The patient was continuously monitored during the procedure by the interventional radiology nurse under my direct supervision. COMPLICATIONS: None immediate. PROCEDURE: Informed written consent was obtained from the patient after a thorough discussion of the procedural risks, benefits and alternatives. All questions were addressed. Maximal Sterile Barrier Technique was utilized including caps, mask, sterile gowns, sterile gloves, sterile drape, hand hygiene and skin antiseptic. A timeout was performed prior to the initiation of the procedure. Under CT guidance, an 18 gauge needle was inserted into the left upper quadrant abscess. It was removed over an Amplatz wire. Ten [place] drain was inserted over the wire. It was looped and string fixed in the abscess cavity. Reddish pus was aspirated. It was sewn to the skin. FINDINGS: Images document placement of a 10  French drain into a left upper quadrant abscess. IMPRESSION: Successful placement of a 10 French drain into a left upper quadrant abscess. Electronically Signed   By: [name] M.D.   On: 12/27/2017 14:54    Labs:  CBC: Recent Labs    12/25/17 0238 12/26/17 0340 12/27/17 0328 12/28/17 0304  WBC 20.3* 22.0* 18.8* 15.5*  HGB 9.6* 9.9* 9.5* 9.2*  HCT 30.4* 31.7* 30.4* 29.5*  PLT 402* 409* 398 471*    COAGS: Recent Labs    12/24/17 1642 12/26/17 0340 12/27/17 0328 12/28/17 0304  INR 3.45 1.72 1.43 1.40    BMP: Recent Labs    12/25/17 0238 12/26/17 0340 12/27/17 0328 12/28/17 0304  NA 133* 134* 135 136  K 3.9 3.3* 3.4* 4.0  CL 101 102 104 104  CO2 19* 19* 19* 23  GLUCOSE  161* 126* 107* 114*  BUN 10 6* 6* 5*  CALCIUM 7.5* 7.6* 7.5* 7.5*  CREATININE 1.21 1.23 0.94 0.48*  GFRNONAA 59* 58* >60 >60  GFRAA >60 >60 >60 >60    LIVER FUNCTION TESTS: Recent Labs    09/20/17 1032 10/19/17 0720 12/24/17 1642 12/25/17 0238  BILITOT 0.3 0.6 1.0 0.7  AST 15 26 15  11*  ALT 10 16 13 9   ALKPHOS 66 60 75 63  PROT 8.0 8.6* 7.8 5.9*  ALBUMIN 3.7 4.2 3.0* 2.1*    TUMOR MARKERS: No results for input(s): AFPTM, CEA, CA199, CHROMGRNA in the last 8760 hours.  Assessment:  S/p LUQ abdominal abscess drain placed 12/27/17 by Dr. Barbie Banner - he continues on PO antibiotics although he states this antibiotic was switched from Augmentin, there is no documentation of this in the chart. He denies any complaints today, has follow up with Dr. Benay Spice on Thursday.  OP from drain has been minimal since d/c, 0-5 cc clear liquid QD per patient and wife - they have been flushing the drain QD as instructed. CT abdomen/pelvis performed today was reviewed by Dr. Anselm Pancoast and decision was made to perform drain injection given results of CT as well as minimal output. Drain injection was negative and drain was removed today without complication.  Patient encouraged to maintain follow up with PCP,  oncology and all other specialities. No further need for IR follow up at this time.  Please call IR with questions or concerns.    Electronically Signed: Joaquim Nam PA-C 01/04/2018, 2:53 PM   Please refer to Dr. Moises Blood attestation of this note for management and plan.

## 2018-01-06 ENCOUNTER — Inpatient Hospital Stay: Payer: Medicare Other | Attending: Oncology | Admitting: Oncology

## 2018-01-06 ENCOUNTER — Telehealth: Payer: Self-pay | Admitting: Oncology

## 2018-01-06 VITALS — BP 149/69 | HR 86 | Temp 97.8°F | Resp 18 | Ht 70.0 in | Wt 159.3 lb

## 2018-01-06 DIAGNOSIS — R630 Anorexia: Secondary | ICD-10-CM | POA: Diagnosis not present

## 2018-01-06 DIAGNOSIS — R6883 Chills (without fever): Secondary | ICD-10-CM | POA: Diagnosis not present

## 2018-01-06 DIAGNOSIS — C7889 Secondary malignant neoplasm of other digestive organs: Secondary | ICD-10-CM

## 2018-01-06 DIAGNOSIS — R110 Nausea: Secondary | ICD-10-CM | POA: Diagnosis not present

## 2018-01-06 DIAGNOSIS — C7989 Secondary malignant neoplasm of other specified sites: Secondary | ICD-10-CM | POA: Insufficient documentation

## 2018-01-06 DIAGNOSIS — C184 Malignant neoplasm of transverse colon: Secondary | ICD-10-CM | POA: Insufficient documentation

## 2018-01-06 DIAGNOSIS — I10 Essential (primary) hypertension: Secondary | ICD-10-CM | POA: Diagnosis not present

## 2018-01-06 NOTE — Telephone Encounter (Signed)
Printed calendar and avs. °

## 2018-01-06 NOTE — Progress Notes (Signed)
[facility] OFFICE PROGRESS NOTE   Diagnosis: Colon cancer  INTERVAL HISTORY:   Cody Suarez was admitted 12/24/2017 with an abdominal abscess.  He was treated with antibiotics and a drain was placed.  He was discharged 12/28/2017 and completed an outpatient course of Augmentin. The drain was removed on 01/04/2018. No recurrent fever.  He generally feels well, but he has not regained his appetite following discharge from the hospital.  No diarrhea or bleeding.  Objective:  Vital signs in last 24 hours:  Blood pressure (!) 149/69, pulse 86, temperature 97.8 F (36.6 C), temperature source Oral, resp. rate 18, height 5' 10"  (1.778 m), weight 159 lb 4.8 oz (72.3 kg), SpO2 100 %.   Resp: Lungs clear bilaterally Cardio: Regular rate and rhythm GI: No hepatosplenomegaly, nontender, no mass left upper quadrant drain site with a less than 1 cm superficial opening Vascular: No leg edema      Lab Results:  Lab Results  Component Value Date   WBC 15.5 (H) 12/28/2017   HGB 9.2 (L) 12/28/2017   HCT 29.5 (L) 12/28/2017   MCV 78.2 (L) 12/28/2017   PLT 471 (H) 12/28/2017   NEUTROABS 12.6 (H) 12/28/2017    CMP  Lab Results  Component Value Date   NA 136 12/28/2017   K 4.0 12/28/2017   CL 104 12/28/2017   CO2 23 12/28/2017   GLUCOSE 114 (H) 12/28/2017   BUN 5 (L) 12/28/2017   CREATININE 0.48 (L) 12/28/2017   CALCIUM 7.5 (L) 12/28/2017   PROT 5.9 (L) 12/25/2017   ALBUMIN 2.1 (L) 12/25/2017   AST 11 (L) 12/25/2017   ALT 9 12/25/2017   ALKPHOS 63 12/25/2017   BILITOT 0.7 12/25/2017   GFRNONAA >60 12/28/2017   GFRAA >60 12/28/2017    Lab Results  Component Value Date   CEA1 3.63 09/20/2017     Imaging:  Ct Abdomen Pelvis W Contrast  Result Date: 01/04/2018 CLINICAL DATA:  Intra-abdominal abscess. EXAM: CT ABDOMEN AND PELVIS WITH CONTRAST TECHNIQUE: Multidetector CT imaging of the abdomen and pelvis was performed using the standard protocol following bolus  administration of intravenous contrast. CONTRAST:  183m OMNIPAQUE IOHEXOL 300 MG/ML  SOLN COMPARISON:  CT of the abdomen and pelvis 12/27/2017 FINDINGS: Lower chest: Left pleural effusion is improved. Mild dependent atelectasis is present at both bases. The heart size is. Hepatobiliary: There is diffuse fatty infiltration of the liver. Cholecystectomy is noted. Common bile duct is within normal limits. Pancreas: Pancreas is normal. Spleen: The spleen is heterogeneous without a dominant lesion. Previously noted complex fluid collection adjacent to the spleen is mostly drained. A small component just deep to the drainage catheter measures 2.1 x 2.1 x 1.4 cm. The more posterior small peripherally enhancing collection measures 1.9 x 1.2 cm. Deep to the fluid collection and inferior to the spleen are central calcifications present at the base of the previously noted mass. Residual mass lesion measures 2.7 x 4 3 cm. Adrenals/Urinary Tract: Adrenal glands are normal bilaterally. Kidneys are within normal limits. There is no stone or mass lesion. Ureters and urinary bladder are within normal limits. Stomach/Bowel: The stomach is immediately adjacent to the drained collection. Duodenum is within normal limits. The small bowel is normal. There is some stranding of the small bowel mesentery. Terminal ileum is normal. Appendix is visualized and normal. The ascending and transverse colon are normal. Partial colectomy is present. Anastomosis is intact. Distal colon rectum are within normal limits. Vascular/Lymphatic: No significant retroperitoneal adenopathy is  present. Atherosclerotic calcifications are present without aneurysm. Reproductive: Prostate is somewhat heterogeneous. No dominant lesion is evident. Other: Paraumbilical hernia contains a loop bowel without obstruction. Musculoskeletal: Degenerative changes are present at L4-5 and L5-S1. Rightward curvature is centered at L2-3. Pelvis is within normal limits.  Degenerative changes are noted at the SI joints. Hips are visualized and normal. IMPRESSION: 1. Near complete drainage of previously noted complex fluid collection in the left upper quadrant. 2. Two small residual collections remain. Residual soft tissue mass at the clips concerning for residual recurrent neoplasm. 3. Peritoneal wall thickening also concerning for metastatic disease. 4. Partial colonic resection and anastomosis is intact. 5. Decreasing left pleural effusion and basilar airspace disease, likely atelectasis. 6. Degenerative changes of the lower lumbar spine and scoliosis. Electronically Signed   By: San Morelle M.D.   On: 01/04/2018 16:08   Ir Sinus/fist Tube Chk-non Gi  Result Date: 01/04/2018 INDICATION: 73 year old with left upper quadrant abscess collection and metastatic colon cancer. Percutaneous drain placed on 12/27/2017. Patient reports minimal from the catheter. CT done earlier in the day demonstrated no significant fluid around the catheter. Tiny residual small fluid collections in the left upper quadrant. EXAM: SINUS TRACT INJECTION / FISTULOGRAM COMPARISON:  None. MEDICATIONS: None ANESTHESIA/SEDATION: None COMPLICATIONS: None immediate. FLUOROSCOPY TIME:  24 seconds, 5 mGy TECHNIQUE: Patient was placed supine on the interventional table. Scout image was obtained. Contrast was injected through the existing catheter. PROCEDURE: Contrast was injected through the existing catheter. Injected contrast was aspirated at the end of the procedure. The catheter was cut and completely removed. Bandage placed over the old drain site. FINDINGS: Small amount of contrast fills around the catheter but there is no residual collection. There is no evidence for a bowel fistula. IMPRESSION: Abscess cavity has resolved and no evidence for a bowel fistula. Therefore, the drain was removed. Electronically Signed   By: Markus Daft M.D.   On: 01/04/2018 17:39    Medications: I have reviewed the  patient's current medications.   Assessment/Plan: 1. 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 Negative for a BRAF mutation  Tumor invaded through the muscularis propria into pericolonic fatty tissue and involved the attached omentum.   Cycle 1 adjuvant Xeloda 05/28/2013.   Cycle 2 adjuvant Xeloda 06/18/2013.   Cycle 3 adjuvant Xeloda 07/09/2013.   Cycle 4 adjuvant Xeloda 07/30/2013.   Cycle 5 adjuvant Xeloda 08/20/2013.  Cycle 6 adjuvant Xeloda 09/11/2013.  cycle 7 adjuvant Xeloda 10/02/2013  Cycle 8 adjuvant Xeloda 10/23/2013  CTs of the chest, abdomen, and pelvis on 07/18/2014-negative for recurrent colon cancer   CTs abdomen/pelvis 09/06/2014 showed acute cholecystitis.  Colonoscopy 08/02/2015-chronic colitis, no malignancy  CTs 09/17/2015, new mass near the tail the pancreas, stable lung nodules  Biopsy mesenteric mass 10/04/2015 with metastatic adenocarcinoma consistent with colorectal primary  PET scan 10/10/2015 with soft tissue thickening within the peritoneal space of the left upper quadrant with SUV Max 7.9; no additional sites of peritoneal nodularity to suggest metastasis; hypermetabolic ill-defined nodule in the left lower lobe favored inflammatory.  Resection of the left abdominal mesenteric mass/tail the pancreas on 11/05/2015 with the pathology confirming metastatic colon cancer, positive "serosal" margin  CTs 07/29/2016-no evidence of metastatic disease, stable small lung nodules, decreased size of pancreatic resection bed fluid/mass  CTs 06/28/2017-decreased fluid and increased soft tissue at the area of the distal pancreatectomy with  a few foci of gas  CT 09/29/2017- enlargement of left upper quadrant soft tissue mass between the stomach, spleen, and pancreas tail, stable  nodules at the lung bases  CT biopsy of the upper quadrant mass 10/19/2017-metastatic colon cancer, MSS, tumor mutation burden 1, no KRAS, BRAF,  or NRAS mutation  CTs 12/24/2017- complex left upper quadrant process involving the stomach, spleen, tail the pancreas, and distal transverse colon. 2. Ulcerative colitis-extensive chronic active ulcerative colitis was noted on the colon resection specimen 03/28/2013.  Followed by Dr.Magod 3. Hypertension.  4. History of microcytic anemia-likely iron deficiency, unable to tolerate oral iron. Improved. 5. Possible area of cecal wall thickening noted on abdominal CT 03/20/2013. 6. Family history of colon cancer (maternal grandfather died in his 63s with colon cancer). 7. Bilateral calf and low anterior leg pain. Bilateral lower extremity venous duplex negative for DVT 07/14/2013. Right ABI with moderate, borderline severe arterial insufficiency; left ABI suggestive of moderate arterial insufficiency. He was referred to vascular. 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 TEE showed thrombus in the descending aorta. He underwent right popliteal to peroneal artery bypass graft, thrombectomy anterior tibialis and attempted thrombectomy tibioperoneal trunk and posterior tibialis on 08/11/2013. Right calf pain 09/26/2013 with findings of occlusion of right popliteal to peroneal bypass status post thrombolysis. Now on anticoagulation with Xarelto.  recurrent pain and ulceration at the right foot, status post a popliteal to posterior tibial artery bypass using a cephalic vein graft on 81/85/9093 8. Status post laparoscopic cholecystectomy 09/08/2014 9. Anorexia following the mesenteric mass/pancreas tail resection-improved 10. Abdominal abscess November 2017-pancreas "cysts "drainage catheter placed 11/26/2015; CT 02/04/2016 with no change to slight decreasein size of residual pancreatic tail fluid collection.Drainage catheter removed  approximately early March 2018  CT 02/20/2016-new small left pleural effusion, increased size of the surgical bed fluid collection, indeterminate pulmonary nodules  CT 07/29/2016-decreased fluid/soft tissue density near the pancreas is no evidence of metastatic disease, stable subcentimeter lung nodules 11.  Admission 12/24/2017 with an abdominal abscess, status post placement of a percutaneous drain and antibiotic therapy, drain removed 01/04/2018   Disposition: Cody Suarez has metastatic colon cancer.  He was recently admitted with a multifocal left abdominal abscess.  The abscess improved following percutaneous drainage.  He is completing a course of antibiotics.  He feels much better compared to hospital admission last month.  He continues to have anorexia.  The plan is to initiate systemic therapy for metastatic colon cancer when his performance status improves.  Cody Suarez will return for an office visit and further discussion in 2 weeks.  Betsy Coder, MD  01/06/2018  11:25 AM

## 2018-01-07 ENCOUNTER — Other Ambulatory Visit: Payer: Self-pay

## 2018-01-07 ENCOUNTER — Encounter: Payer: Self-pay | Admitting: Family Medicine

## 2018-01-07 ENCOUNTER — Ambulatory Visit (INDEPENDENT_AMBULATORY_CARE_PROVIDER_SITE_OTHER): Payer: Medicare Other | Admitting: Family Medicine

## 2018-01-07 VITALS — BP 140/76 | HR 102 | Temp 98.0°F | Resp 14 | Ht 70.0 in | Wt 160.6 lb

## 2018-01-07 DIAGNOSIS — K651 Peritoneal abscess: Secondary | ICD-10-CM | POA: Diagnosis not present

## 2018-01-07 DIAGNOSIS — A4150 Gram-negative sepsis, unspecified: Secondary | ICD-10-CM

## 2018-01-07 DIAGNOSIS — Z8619 Personal history of other infectious and parasitic diseases: Secondary | ICD-10-CM | POA: Diagnosis not present

## 2018-01-07 LAB — BASIC METABOLIC PANEL
BUN: 18 mg/dL (ref 6–23)
CO2: 25 mEq/L (ref 19–32)
Calcium: 9.3 mg/dL (ref 8.4–10.5)
Chloride: 99 mEq/L (ref 96–112)
Creatinine, Ser: 1.03 mg/dL (ref 0.40–1.50)
GFR: 75.23 mL/min (ref >60.00)
Glucose, Bld: 162 mg/dL — ABNORMAL HIGH (ref 70–99)
Potassium: 4.3 mEq/L (ref 3.5–5.1)
Sodium: 135 mEq/L (ref 135–145)

## 2018-01-07 LAB — CBC WITH DIFFERENTIAL/PLATELET
Basophils Absolute: 0.3 10*3/uL — ABNORMAL HIGH (ref 0.0–0.1)
Basophils Relative: 2 % (ref 0.0–3.0)
Eosinophils Absolute: 0.3 10*3/uL (ref 0.0–0.7)
Eosinophils Relative: 2.6 % (ref 0.0–5.0)
HCT: 38.7 % — ABNORMAL LOW (ref 39.0–52.0)
HEMOGLOBIN: 12.2 g/dL — AB (ref 13.0–17.0)
Lymphocytes Relative: 15 % (ref 12.0–46.0)
Lymphs Abs: 2 10*3/uL (ref 0.7–4.0)
MCHC: 31.6 g/dL (ref 30.0–36.0)
MCV: 81 fl (ref 78.0–100.0)
Monocytes Absolute: 1.3 10*3/uL — ABNORMAL HIGH (ref 0.1–1.0)
Monocytes Relative: 9.8 % (ref 3.0–12.0)
Neutro Abs: 9.3 10*3/uL — ABNORMAL HIGH (ref 1.4–7.7)
Neutrophils Relative %: 70.6 % (ref 43.0–77.0)
Platelets: 735 10*3/uL — ABNORMAL HIGH (ref 150.0–400.0)
RBC: 4.78 Mil/uL (ref 4.22–5.81)
RDW: 21.6 % — AB (ref 11.5–15.5)
WBC: 13.1 10*3/uL — ABNORMAL HIGH (ref 4.0–10.5)

## 2018-01-07 NOTE — Assessment & Plan Note (Signed)
Resolved w/ IR drainage and drain placement.  Has to finish his course of Omnicef.  He is tolerating this w/o difficulty.  Not febrile, denies abd pain.  Check WBC to ensure this is trending down.  Will follow.

## 2018-01-07 NOTE — Patient Instructions (Signed)
Follow up as needed or as scheduled We'll notify you of your lab results and make any changes if needed FINISH the [place] as directed EAT!!! Call with any questions or concerns Happy New Year!!  You are a miracle worth celebrating!!!

## 2018-01-07 NOTE — Assessment & Plan Note (Signed)
Resolved.  Pt looks much better today in office- coloring is better, no pain, no distress.  Check labs to ensure WBC is trending down and check Ca++ as this was low while hospitalized.  Pt expressed understanding and is in agreement w/ plan.

## 2018-01-07 NOTE — Progress Notes (Signed)
   Subjective:    Patient ID: Cody Suarez, male    DOB: 1945/04/20, 73 y.o.   MRN: [id number]  HPI Hospital f/u- pt was admitted 12/20-24 w/ intraabdominal abscess and sepsis.  He underwent IR drainage of abscesses and was d/c'd home w/ drain and on Augmentin.  Subsequent culture results showed resistance to Augmentin so I switched him to [facility].  Drain has since been removed by IR.  He saw Dr Benay Spice yesterday.  Pt denies abdominal pain.  Appetite is picking up- started Boost Plus per Dr Benay Spice.  No fevers.  Pt still feels weak but much improved from 12/20.  No N/V.  Pt had mild hypocalcemia at time of d/c and leukocytosis (expected)  Reviewed hospital notes, labs, imaging, d/c summary.  Meds reconciled   Review of Systems For ROS see HPI     Objective:   Physical Exam Vitals signs reviewed.  Constitutional:      General: He is not in acute distress.    Appearance: Normal appearance. He is well-developed. He is not ill-appearing.  HENT:     Head: Normocephalic and atraumatic.  Eyes:     Conjunctiva/sclera: Conjunctivae normal.     Pupils: Pupils are equal, round, and reactive to light.  Neck:     Musculoskeletal: Normal range of motion and neck supple.     Thyroid: No thyromegaly.  Cardiovascular:     Rate and Rhythm: Normal rate and regular rhythm.     Heart sounds: Normal heart sounds. No murmur.  Pulmonary:     Effort: Pulmonary effort is normal. No respiratory distress.     Breath sounds: Normal breath sounds.  Abdominal:     General: Bowel sounds are normal. There is no distension.     Palpations: Abdomen is soft.  Lymphadenopathy:     Cervical: No cervical adenopathy.  Skin:    General: Skin is warm and dry.     Comments: Coloring much improved  Neurological:     Mental Status: He is alert and oriented to person, place, and time.     Cranial Nerves: No cranial nerve deficit.  Psychiatric:        Behavior: Behavior normal.           Assessment & Plan:

## 2018-01-10 ENCOUNTER — Other Ambulatory Visit: Payer: Self-pay | Admitting: General Practice

## 2018-01-10 ENCOUNTER — Telehealth: Payer: Self-pay | Admitting: Nurse Practitioner

## 2018-01-10 DIAGNOSIS — D691 Qualitative platelet defects: Secondary | ICD-10-CM

## 2018-01-10 NOTE — Telephone Encounter (Signed)
Called patient per 1/ sch message - unable to reach patient left message for patient with new appt time for 1/14

## 2018-01-11 ENCOUNTER — Telehealth: Payer: Self-pay | Admitting: Cardiovascular Disease

## 2018-01-11 NOTE — Telephone Encounter (Signed)
Patient made aware that samples are available at the front.   Medication Samples have been provided to the patient.  Drug name: Xarelto       Strength: 20 mg        Qty: 4 bottles  LOT: [id number]  Exp.Date: 06-21

## 2018-01-11 NOTE — Telephone Encounter (Signed)
New Message:        Pt just said he wanted you to call him and after that we were disconnected.

## 2018-01-12 ENCOUNTER — Telehealth: Payer: Self-pay

## 2018-01-12 ENCOUNTER — Other Ambulatory Visit (INDEPENDENT_AMBULATORY_CARE_PROVIDER_SITE_OTHER): Payer: Medicare Other

## 2018-01-12 DIAGNOSIS — D691 Qualitative platelet defects: Secondary | ICD-10-CM

## 2018-01-12 LAB — CBC WITH DIFFERENTIAL/PLATELET
Basophils Absolute: 0.2 10*3/uL — ABNORMAL HIGH (ref 0.0–0.1)
Basophils Relative: 1.4 % (ref 0.0–3.0)
EOS PCT: 1.1 % (ref 0.0–5.0)
Eosinophils Absolute: 0.2 10*3/uL (ref 0.0–0.7)
HCT: 37.6 % — ABNORMAL LOW (ref 39.0–52.0)
Hemoglobin: 12 g/dL — ABNORMAL LOW (ref 13.0–17.0)
Lymphocytes Relative: 9.2 % — ABNORMAL LOW (ref 12.0–46.0)
Lymphs Abs: 1.6 10*3/uL (ref 0.7–4.0)
MCHC: 32.1 g/dL (ref 30.0–36.0)
MCV: 80.9 fl (ref 78.0–100.0)
Monocytes Absolute: 1.9 10*3/uL — ABNORMAL HIGH (ref 0.1–1.0)
Monocytes Relative: 11.2 % (ref 3.0–12.0)
NEUTROS PCT: 77.1 % — AB (ref 43.0–77.0)
Neutro Abs: 13.2 10*3/uL — ABNORMAL HIGH (ref 1.4–7.7)
Platelets: 513 10*3/uL — ABNORMAL HIGH (ref 150.0–400.0)
RBC: 4.65 Mil/uL (ref 4.22–5.81)
RDW: 21.3 % — ABNORMAL HIGH (ref 11.5–15.5)
WBC: 17.1 10*3/uL — ABNORMAL HIGH (ref 4.0–10.5)

## 2018-01-12 MED ORDER — ONDANSETRON HCL 4 MG PO TABS: 4.0000 mg | ORAL_TABLET | Freq: Three times a day (TID) | ORAL | 0 refills | Status: DC | PRN

## 2018-01-12 NOTE — Telephone Encounter (Signed)
Medication filled to pharmacy as requested.   

## 2018-01-12 NOTE — Telephone Encounter (Signed)
Pt is requesting Zofran 4 mg be sent to [facility] in [place].

## 2018-01-12 NOTE — Telephone Encounter (Signed)
Please advise on Qty and SIG?

## 2018-01-12 NOTE — Telephone Encounter (Signed)
Ok for Zofran 25m daily, TID prn, #30, no refills

## 2018-01-13 ENCOUNTER — Other Ambulatory Visit: Payer: Self-pay | Admitting: Family Medicine

## 2018-01-13 ENCOUNTER — Telehealth: Payer: Self-pay

## 2018-01-13 DIAGNOSIS — D72829 Elevated white blood cell count, unspecified: Secondary | ICD-10-CM

## 2018-01-13 NOTE — Telephone Encounter (Signed)
FYI

## 2018-01-13 NOTE — Telephone Encounter (Signed)
Copied from [place] [id number]. Topic: General - Other >> Jan 13, 2018  9:00 AM Ivar Drape wrote: Reason for CRM:   Provider wanted to know if the patient was experiencing any abdominal pain or fever, and he stated he is not.  He has made a lab appt for Fri 01/14/2018 at 11:00am

## 2018-01-14 ENCOUNTER — Other Ambulatory Visit (INDEPENDENT_AMBULATORY_CARE_PROVIDER_SITE_OTHER): Payer: Medicare Other

## 2018-01-14 DIAGNOSIS — D72829 Elevated white blood cell count, unspecified: Secondary | ICD-10-CM

## 2018-01-14 LAB — CBC WITH DIFFERENTIAL/PLATELET
Basophils Absolute: 0.2 10*3/uL — ABNORMAL HIGH (ref 0.0–0.1)
Basophils Relative: 1.2 % (ref 0.0–3.0)
EOS PCT: 1.2 % (ref 0.0–5.0)
Eosinophils Absolute: 0.2 10*3/uL (ref 0.0–0.7)
HCT: 37.4 % — ABNORMAL LOW (ref 39.0–52.0)
Hemoglobin: 12 g/dL — ABNORMAL LOW (ref 13.0–17.0)
Lymphocytes Relative: 10 % — ABNORMAL LOW (ref 12.0–46.0)
Lymphs Abs: 1.7 10*3/uL (ref 0.7–4.0)
MCHC: 32.1 g/dL (ref 30.0–36.0)
MCV: 81.2 fl (ref 78.0–100.0)
MONO ABS: 1.9 10*3/uL — AB (ref 0.1–1.0)
Monocytes Relative: 11.1 % (ref 3.0–12.0)
Neutro Abs: 12.8 10*3/uL — ABNORMAL HIGH (ref 1.4–7.7)
Neutrophils Relative %: 76.5 % (ref 43.0–77.0)
Platelets: 442 10*3/uL — ABNORMAL HIGH (ref 150.0–400.0)
RBC: 4.61 Mil/uL (ref 4.22–5.81)
RDW: 21.4 % — ABNORMAL HIGH (ref 11.5–15.5)
WBC: 16.7 10*3/uL — ABNORMAL HIGH (ref 4.0–10.5)

## 2018-01-18 ENCOUNTER — Encounter: Payer: Self-pay | Admitting: Nurse Practitioner

## 2018-01-18 ENCOUNTER — Ambulatory Visit (HOSPITAL_COMMUNITY)
Admission: RE | Admit: 2018-01-18 | Discharge: 2018-01-18 | Disposition: A | Discharge status: Home / Self Care | Payer: Medicare Other | Source: Ambulatory Visit | Attending: Nurse Practitioner | Admitting: Nurse Practitioner

## 2018-01-18 ENCOUNTER — Inpatient Hospital Stay (HOSPITAL_BASED_OUTPATIENT_CLINIC_OR_DEPARTMENT_OTHER): Payer: Medicare Other | Admitting: Nurse Practitioner

## 2018-01-18 ENCOUNTER — Telehealth: Payer: Self-pay | Admitting: Nurse Practitioner

## 2018-01-18 ENCOUNTER — Inpatient Hospital Stay: Payer: Medicare Other

## 2018-01-18 VITALS — BP 127/61 | HR 99 | Temp 97.9°F | Resp 19 | Ht 70.0 in | Wt 159.9 lb

## 2018-01-18 DIAGNOSIS — R6883 Chills (without fever): Secondary | ICD-10-CM

## 2018-01-18 DIAGNOSIS — I10 Essential (primary) hypertension: Secondary | ICD-10-CM

## 2018-01-18 DIAGNOSIS — C189 Malignant neoplasm of colon, unspecified: Secondary | ICD-10-CM | POA: Diagnosis not present

## 2018-01-18 DIAGNOSIS — R630 Anorexia: Secondary | ICD-10-CM | POA: Diagnosis not present

## 2018-01-18 DIAGNOSIS — C7889 Secondary malignant neoplasm of other digestive organs: Secondary | ICD-10-CM | POA: Insufficient documentation

## 2018-01-18 DIAGNOSIS — C184 Malignant neoplasm of transverse colon: Secondary | ICD-10-CM

## 2018-01-18 DIAGNOSIS — R110 Nausea: Secondary | ICD-10-CM

## 2018-01-18 LAB — CBC WITH DIFFERENTIAL (CANCER CENTER ONLY)
Abs Immature Granulocytes: 0.18 10*3/uL — ABNORMAL HIGH (ref 0.00–0.07)
BASOS ABS: 0.1 10*3/uL (ref 0.0–0.1)
Basophils Relative: 1 %
Eosinophils Absolute: 0.1 10*3/uL (ref 0.0–0.5)
Eosinophils Relative: 0 %
HCT: 37.2 % — ABNORMAL LOW (ref 39.0–52.0)
Hemoglobin: 11.8 g/dL — ABNORMAL LOW (ref 13.0–17.0)
Immature Granulocytes: 1 %
Lymphocytes Relative: 4 %
Lymphs Abs: 0.9 10*3/uL (ref 0.7–4.0)
MCH: 25.7 pg — ABNORMAL LOW (ref 26.0–34.0)
MCHC: 31.7 g/dL (ref 30.0–36.0)
MCV: 80.9 fL (ref 80.0–100.0)
Monocytes Absolute: 1.1 10*3/uL — ABNORMAL HIGH (ref 0.1–1.0)
Monocytes Relative: 5 %
Neutro Abs: 21.3 10*3/uL — ABNORMAL HIGH (ref 1.7–7.7)
Neutrophils Relative %: 89 %
PLATELETS: 281 10*3/uL (ref 150–400)
RBC: 4.6 MIL/uL (ref 4.22–5.81)
RDW: 20 % — ABNORMAL HIGH (ref 11.5–15.5)
WBC: 23.7 10*3/uL — AB (ref 4.0–10.5)
nRBC: 0 % (ref 0.0–0.2)

## 2018-01-18 LAB — LIPASE, BLOOD: Lipase: 27 U/L (ref 11–51)

## 2018-01-18 LAB — CMP (CANCER CENTER ONLY)
ALT: 23 U/L (ref 0–44)
AST: 29 U/L (ref 15–41)
Albumin: 3.1 g/dL — ABNORMAL LOW (ref 3.5–5.0)
Alkaline Phosphatase: 105 U/L (ref 38–126)
Anion gap: 10 (ref 5–15)
BILIRUBIN TOTAL: 0.4 mg/dL (ref 0.3–1.2)
BUN: 12 mg/dL (ref 8–23)
CO2: 24 mmol/L (ref 22–32)
Calcium: 9.4 mg/dL (ref 8.9–10.3)
Chloride: 100 mmol/L (ref 98–111)
Creatinine: 1.07 mg/dL (ref 0.61–1.24)
GFR, Est AFR Am: >60 mL/min (ref >60)
GFR, Estimated: >60 mL/min (ref >60)
Glucose, Bld: 159 mg/dL — ABNORMAL HIGH (ref 70–99)
Potassium: 4 mmol/L (ref 3.5–5.1)
Sodium: 134 mmol/L — ABNORMAL LOW (ref 135–145)
TOTAL PROTEIN: 7.7 g/dL (ref 6.5–8.1)

## 2018-01-18 LAB — AMYLASE: Amylase: 44 U/L (ref 28–100)

## 2018-01-18 MED ORDER — SODIUM CHLORIDE (PF) 0.9 % IJ SOLN
INTRAMUSCULAR | Status: AC
  Filled 2018-01-18: qty 50

## 2018-01-18 MED ORDER — IOHEXOL 300 MG/ML  SOLN
100.0000 mL | Freq: Once | INTRAMUSCULAR | Status: AC | PRN
  Administered 2018-01-18: 100 mL via INTRAVENOUS

## 2018-01-18 NOTE — Telephone Encounter (Signed)
Contacted Cody Suarez with CT result. He will call with fever/chills. Follow-up as scheduled on 01/20/2018.

## 2018-01-18 NOTE — Progress Notes (Addendum)
[name] OFFICE PROGRESS NOTE   Diagnosis: Colon cancer  INTERVAL HISTORY:   Cody Suarez returns as scheduled.  He is not feeling well.  He is having intermittent nausea.  No diarrhea.  No bleeding.  Appetite is poor.  He had an episode of shaking chills and abdominal pain yesterday.  No fever.  Objective:  Vital signs in last 24 hours:  Blood pressure 127/61, pulse 99, temperature 97.9 F (36.6 C), temperature source Oral, resp. rate 19, height 5' 10"  (1.778 m), weight 159 lb 14.4 oz (72.5 kg), SpO2 98 %.    HEENT: No thrush or ulcers. Resp: Lungs clear bilaterally. Cardio: Regular rate and rhythm. GI: No hepatomegaly.  No mass.  Abdomen appears mildly distended. Vascular: No leg edema. Neuro: Alert and oriented. Skin: Warm and dry.   Lab Results:  Lab Results  Component Value Date   WBC 16.7 (H) 01/14/2018   HGB 12.0 (L) 01/14/2018   HCT 37.4 (L) 01/14/2018   MCV 81.2 01/14/2018   PLT 442.0 (H) 01/14/2018   NEUTROABS 12.8 (H) 01/14/2018    Imaging:  No results found.  Medications: I have reviewed the patient's current medications.  Assessment/Plan: 1. 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 Negative for a BRAF mutation  Tumor invaded through the muscularis propria into pericolonic fatty tissue and involved the attached omentum.   Cycle 1 adjuvant Xeloda 05/28/2013.   Cycle 2 adjuvant Xeloda 06/18/2013.   Cycle 3 adjuvant Xeloda 07/09/2013.   Cycle 4 adjuvant Xeloda 07/30/2013.   Cycle 5 adjuvant Xeloda 08/20/2013.  Cycle 6 adjuvant Xeloda 09/11/2013.  cycle 7 adjuvant Xeloda 10/02/2013  Cycle 8 adjuvant Xeloda 10/23/2013  CTs of the chest, abdomen, and pelvis on 07/18/2014-negative for recurrent colon cancer   CTs abdomen/pelvis 09/06/2014  showed acute cholecystitis.  Colonoscopy 08/02/2015-chronic colitis, no malignancy  CTs 09/17/2015, new mass near the tail the pancreas, stable lung nodules  Biopsy mesenteric mass 10/04/2015 with metastatic adenocarcinoma consistent with colorectal primary  PET scan 10/10/2015 with soft tissue thickening within the peritoneal space of the left upper quadrant with SUV Max 7.9; no additional sites of peritoneal nodularity to suggest metastasis; hypermetabolic ill-defined nodule in the left lower lobe favored inflammatory.  Resection of the left abdominal mesenteric mass/tail the pancreas on 11/05/2015 with the pathology confirming metastatic colon cancer, positive "serosal" margin  CTs 07/29/2016-no evidence of metastatic disease, stable small lung nodules, decreased size of pancreatic resection bed fluid/mass  CTs 06/28/2017-decreased fluid and increased soft tissue at the area of the distal pancreatectomy with a few foci of gas  CT 09/29/2017-enlargement of left upper quadrant soft tissue mass between the stomach, spleen, and pancreas tail, stable nodules at the lung bases  CT biopsy of the upper quadrant mass 10/19/2017-metastatic colon cancer, MSS, tumor mutation burden 1, no KRAS, BRAF,  or NRAS mutation  CTs 12/24/2017- complex left upper quadrant process involving the stomach, spleen, tail the pancreas, and distal transverse colon. 2. Ulcerative colitis-extensive chronic active ulcerative colitis was noted on the colon resection specimen 03/28/2013.  Followed by Dr.Magod 3. Hypertension.  4. History of microcytic anemia-likely iron deficiency, unable to tolerate oral iron. Improved. 5. Possible area of cecal wall thickening noted on abdominal CT 03/20/2013. 6. Family history of colon cancer (maternal grandfather died in his 20s with colon cancer). 7. Bilateral calf and low anterior leg pain. Bilateral  lower extremity venous duplex negative for DVT 07/14/2013. Right ABI with  moderate, borderline severe arterial insufficiency; left ABI suggestive of moderate arterial insufficiency. He was referred to vascular. 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 TEE showed thrombus in the descending aorta. He underwent right popliteal to peroneal artery bypass graft, thrombectomy anterior tibialis and attempted thrombectomy tibioperoneal trunk and posterior tibialis on 08/11/2013. Right calf pain 09/26/2013 with findings of occlusion of right popliteal to peroneal bypass status post thrombolysis. Now on anticoagulation with Xarelto.  recurrent pain and ulceration at the right foot, status post a popliteal to posterior tibial artery bypass using a cephalic vein graft on 65/99/3570 8. Status post laparoscopic cholecystectomy 09/08/2014 9. Anorexia following the mesenteric mass/pancreas tail resection-improved 10. Abdominal abscess November 2017-pancreas "cysts "drainage catheter placed 11/26/2015; CT 02/04/2016 with no change to slight decreasein size of residual pancreatic tail fluid collection.Drainage catheter removed approximately early March 2018  CT 02/20/2016-new small left pleural effusion, increased size of the surgical bed fluid collection, indeterminate pulmonary nodules  CT 07/29/2016-decreased fluid/soft tissue density near the pancreas is no evidence of metastatic disease, stable subcentimeter lung nodules 11.  Admission 12/24/2017 with an abdominal abscess, status post placement of a percutaneous drain and antibiotic therapy; CTs abdomen/pelvis 01/04/2018- near complete drainage of previously noted complex fluid collection in the left upper quadrant.  2 small residual collections remain.  Residual soft tissue mass at the clips concerning for residual recurrent neoplasm.  Peritoneal wall thickening also concerning for metastatic disease. Drain removed 01/04/2018  Disposition: Cody Suarez continues to have a poor performance status.  He is now having  intermittent nausea and had an episode of shaking chills yesterday.  We are referring him for stat CT scans to evaluate for recurrence of the recent abdominal abscess.  He will return to the lab prior to the CT scans for a CBC, chemistry panel, amylase and lipase.  He will return for a follow-up visit on 01/20/2018, sooner pending the CT result.  Patient seen with Dr. Benay Spice.  25 minutes were spent face-to-face at today's visit with the majority of that time involved in counseling/coordination of care.    Ned Card ANP/GNP-BC   01/18/2018  9:31 AM  This was a shared visit with Ned Card.  Cody Suarez was interviewed and examined.  He has chills and a poor performance status.  We are concerned of a persistent abdominal infection.  He will be referred for a restaging CT.  Julieanne Manson, MD

## 2018-01-20 ENCOUNTER — Inpatient Hospital Stay (HOSPITAL_COMMUNITY)
Admission: AD | Admit: 2018-01-20 | Discharge: 2018-01-24 | DRG: 372 | Disposition: A | Discharge status: Home Health | Payer: Medicare Other | Source: Other Acute Inpatient Hospital | Attending: Family Medicine | Admitting: Family Medicine

## 2018-01-20 ENCOUNTER — Encounter (HOSPITAL_COMMUNITY): Payer: Self-pay | Admitting: *Deleted

## 2018-01-20 ENCOUNTER — Inpatient Hospital Stay (HOSPITAL_BASED_OUTPATIENT_CLINIC_OR_DEPARTMENT_OTHER): Payer: Medicare Other | Admitting: Oncology

## 2018-01-20 ENCOUNTER — Other Ambulatory Visit: Payer: Self-pay

## 2018-01-20 VITALS — BP 131/69 | HR 100 | Temp 100.1°F | Resp 18 | Ht 70.0 in | Wt 161.6 lb

## 2018-01-20 DIAGNOSIS — D509 Iron deficiency anemia, unspecified: Secondary | ICD-10-CM | POA: Diagnosis present

## 2018-01-20 DIAGNOSIS — Z9221 Personal history of antineoplastic chemotherapy: Secondary | ICD-10-CM | POA: Diagnosis not present

## 2018-01-20 DIAGNOSIS — Z90411 Acquired partial absence of pancreas: Secondary | ICD-10-CM

## 2018-01-20 DIAGNOSIS — I739 Peripheral vascular disease, unspecified: Secondary | ICD-10-CM | POA: Diagnosis present

## 2018-01-20 DIAGNOSIS — C7889 Secondary malignant neoplasm of other digestive organs: Secondary | ICD-10-CM

## 2018-01-20 DIAGNOSIS — K5190 Ulcerative colitis, unspecified, without complications: Secondary | ICD-10-CM | POA: Diagnosis present

## 2018-01-20 DIAGNOSIS — Z85038 Personal history of other malignant neoplasm of large intestine: Secondary | ICD-10-CM

## 2018-01-20 DIAGNOSIS — Z800 Family history of malignant neoplasm of digestive organs: Secondary | ICD-10-CM | POA: Diagnosis not present

## 2018-01-20 DIAGNOSIS — L0291 Cutaneous abscess, unspecified: Secondary | ICD-10-CM

## 2018-01-20 DIAGNOSIS — R110 Nausea: Secondary | ICD-10-CM | POA: Diagnosis not present

## 2018-01-20 DIAGNOSIS — Z7952 Long term (current) use of systemic steroids: Secondary | ICD-10-CM

## 2018-01-20 DIAGNOSIS — Z7901 Long term (current) use of anticoagulants: Secondary | ICD-10-CM

## 2018-01-20 DIAGNOSIS — R630 Anorexia: Secondary | ICD-10-CM

## 2018-01-20 DIAGNOSIS — I998 Other disorder of circulatory system: Secondary | ICD-10-CM | POA: Diagnosis present

## 2018-01-20 DIAGNOSIS — Z86718 Personal history of other venous thrombosis and embolism: Secondary | ICD-10-CM | POA: Diagnosis not present

## 2018-01-20 DIAGNOSIS — I10 Essential (primary) hypertension: Secondary | ICD-10-CM | POA: Diagnosis not present

## 2018-01-20 DIAGNOSIS — D638 Anemia in other chronic diseases classified elsewhere: Secondary | ICD-10-CM | POA: Diagnosis present

## 2018-01-20 DIAGNOSIS — R509 Fever, unspecified: Secondary | ICD-10-CM

## 2018-01-20 DIAGNOSIS — R627 Adult failure to thrive: Secondary | ICD-10-CM | POA: Diagnosis not present

## 2018-01-20 DIAGNOSIS — T8149XA Infection following a procedure, other surgical site, initial encounter: Secondary | ICD-10-CM

## 2018-01-20 DIAGNOSIS — C184 Malignant neoplasm of transverse colon: Secondary | ICD-10-CM

## 2018-01-20 DIAGNOSIS — T8143XA Infection following a procedure, organ and space surgical site, initial encounter: Secondary | ICD-10-CM

## 2018-01-20 DIAGNOSIS — K651 Peritoneal abscess: Secondary | ICD-10-CM | POA: Diagnosis present

## 2018-01-20 DIAGNOSIS — R6883 Chills (without fever): Secondary | ICD-10-CM

## 2018-01-20 DIAGNOSIS — E440 Moderate protein-calorie malnutrition: Secondary | ICD-10-CM | POA: Diagnosis present

## 2018-01-20 DIAGNOSIS — Z79899 Other long term (current) drug therapy: Secondary | ICD-10-CM | POA: Diagnosis not present

## 2018-01-20 DIAGNOSIS — Z9049 Acquired absence of other specified parts of digestive tract: Secondary | ICD-10-CM | POA: Diagnosis not present

## 2018-01-20 DIAGNOSIS — E785 Hyperlipidemia, unspecified: Secondary | ICD-10-CM | POA: Diagnosis not present

## 2018-01-20 DIAGNOSIS — Z6822 Body mass index (BMI) 22.0-22.9, adult: Secondary | ICD-10-CM | POA: Diagnosis not present

## 2018-01-20 LAB — COMPREHENSIVE METABOLIC PANEL
ALT: 15 U/L (ref 0–44)
AST: 18 U/L (ref 15–41)
Albumin: 3.2 g/dL — ABNORMAL LOW (ref 3.5–5.0)
Alkaline Phosphatase: 75 U/L (ref 38–126)
Anion gap: 10 (ref 5–15)
BUN: 11 mg/dL (ref 8–23)
CO2: 24 mmol/L (ref 22–32)
Calcium: 8.4 mg/dL — ABNORMAL LOW (ref 8.9–10.3)
Chloride: 99 mmol/L (ref 98–111)
Creatinine, Ser: 1.02 mg/dL (ref 0.61–1.24)
GFR calc Af Amer: >60 mL/min (ref >60)
Glucose, Bld: 119 mg/dL — ABNORMAL HIGH (ref 70–99)
Potassium: 3.6 mmol/L (ref 3.5–5.1)
Sodium: 133 mmol/L — ABNORMAL LOW (ref 135–145)
TOTAL PROTEIN: 7.4 g/dL (ref 6.5–8.1)
Total Bilirubin: 0.5 mg/dL (ref 0.3–1.2)

## 2018-01-20 MED ORDER — DIPHENHYDRAMINE HCL 25 MG PO CAPS
25.0000 mg | ORAL_CAPSULE | Freq: Every evening | ORAL | Status: DC | PRN
  Administered 2018-01-20 – 2018-01-21 (×2): 25 mg via ORAL
  Filled 2018-01-20 (×2): qty 1

## 2018-01-20 MED ORDER — ACETAMINOPHEN 500 MG PO TABS: 1000.0000 mg | ORAL_TABLET | Freq: Every day | ORAL | Status: DC | PRN

## 2018-01-20 MED ORDER — ACETAMINOPHEN 325 MG PO TABS
ORAL_TABLET | ORAL | Status: AC
  Filled 2018-01-20: qty 2

## 2018-01-20 MED ORDER — SPIRONOLACTONE 25 MG PO TABS
25.0000 mg | ORAL_TABLET | Freq: Every day | ORAL | Status: DC
  Administered 2018-01-21 – 2018-01-23 (×3): 25 mg via ORAL
  Filled 2018-01-20 (×4): qty 1

## 2018-01-20 MED ORDER — ENSURE ENLIVE PO LIQD: 237.0000 mL | Freq: Two times a day (BID) | ORAL | Status: DC

## 2018-01-20 MED ORDER — PREDNISONE 5 MG PO TABS
5.0000 mg | ORAL_TABLET | Freq: Every day | ORAL | Status: DC
  Administered 2018-01-21 – 2018-01-22 (×2): 5 mg via ORAL
  Filled 2018-01-20 (×2): qty 1

## 2018-01-20 MED ORDER — ACETAMINOPHEN 500 MG PO TABS: 500.0000 mg | ORAL_TABLET | Freq: Every evening | ORAL | Status: DC | PRN

## 2018-01-20 MED ORDER — ENOXAPARIN SODIUM 40 MG/0.4ML ~~LOC~~ SOLN: 40.0000 mg | SUBCUTANEOUS | Status: DC

## 2018-01-20 MED ORDER — ONDANSETRON HCL 4 MG PO TABS
4.0000 mg | ORAL_TABLET | Freq: Three times a day (TID) | ORAL | Status: DC | PRN
  Administered 2018-01-20 – 2018-01-22 (×4): 4 mg via ORAL
  Filled 2018-01-20 (×4): qty 1

## 2018-01-20 MED ORDER — OXYCODONE-ACETAMINOPHEN 5-325 MG PO TABS: 1.0000 | ORAL_TABLET | ORAL | Status: DC | PRN

## 2018-01-20 MED ORDER — SODIUM CHLORIDE 0.9 % IV SOLN
2.0000 g | Freq: Three times a day (TID) | INTRAVENOUS | Status: DC
  Administered 2018-01-20 – 2018-01-23 (×9): 2 g via INTRAVENOUS
  Filled 2018-01-20 (×10): qty 2

## 2018-01-20 MED ORDER — POLYETHYLENE GLYCOL 3350 17 G PO PACK: 17.0000 g | PACK | Freq: Every day | ORAL | Status: DC | PRN

## 2018-01-20 MED ORDER — DIPHENHYDRAMINE-APAP (SLEEP) 25-500 MG PO TABS: 2.0000 | ORAL_TABLET | Freq: Every evening | ORAL | Status: DC | PRN

## 2018-01-20 MED ORDER — ACETAMINOPHEN 500 MG PO TABS
1000.0000 mg | ORAL_TABLET | Freq: Every day | ORAL | Status: DC | PRN
  Administered 2018-01-23: 1000 mg via ORAL
  Filled 2018-01-20: qty 2

## 2018-01-20 MED ORDER — ACETAMINOPHEN 325 MG PO TABS
650.0000 mg | ORAL_TABLET | Freq: Once | ORAL | Status: AC
  Administered 2018-01-20: 650 mg via ORAL

## 2018-01-20 MED ORDER — SODIUM CHLORIDE 0.9 % IV SOLN
INTRAVENOUS | Status: DC
  Administered 2018-01-20 – 2018-01-22 (×5): via INTRAVENOUS

## 2018-01-20 MED ORDER — LOSARTAN POTASSIUM 50 MG PO TABS
50.0000 mg | ORAL_TABLET | Freq: Every day | ORAL | Status: DC
  Administered 2018-01-21 – 2018-01-23 (×3): 50 mg via ORAL
  Filled 2018-01-20 (×4): qty 1

## 2018-01-20 MED ORDER — SULFASALAZINE 500 MG PO TBEC
1500.0000 mg | DELAYED_RELEASE_TABLET | Freq: Every day | ORAL | Status: DC
  Administered 2018-01-21 – 2018-01-24 (×4): 1500 mg via ORAL
  Filled 2018-01-20 (×5): qty 3

## 2018-01-20 NOTE — Progress Notes (Signed)
Pharmacy Antibiotic Note  Cody Suarez is a 73 y.o. male with a recent h/o intraabdominal abscess with drain admitted on 01/20/2018 with fever and chills. CT from 01/18/18 with necrotic mass and area of fluid collection suspicious for metastatic tumor vs infectious. Abdominal abscess culture from 12/28 w/ E coli.  Pharmacy has been consulted for cefepime dosing for possible infectious etiology.   Plan: Cefepime 2 g iv q 8 hours.   Height: 5' 10"  (177.8 cm) Weight: 155 lb 10.3 oz (70.6 kg) IBW/kg (Calculated) : 73  Temp (24hrs), Avg:99.7 F (37.6 C), Min:98.8 F (37.1 C), Max:100.1 F (37.8 C)  Recent Labs  Lab 01/14/18 1056 01/18/18 1027 01/20/18 1617  WBC 16.7* 23.7*  --   CREATININE  --  1.07 1.02    Estimated Creatinine Clearance: 64.4 mL/min (by C-G formula based on SCr of 1.02 mg/dL).    Allergies  Allergen Reactions  . Amlodipine Swelling    Right LE edema  . Gabapentin Rash    Short-term memory decrease    Antimicrobials this admission: 1/16 cefepime >>   Dose adjustments this admission:  Microbiology results: 1/16 BCx:  1/16 UCx:    Thank you for allowing pharmacy to be a part of this patient's care.  Ulice Dash D 01/20/2018 5:50 PM

## 2018-01-20 NOTE — Progress Notes (Signed)
[place] OFFICE PROGRESS NOTE   Diagnosis: Colon cancer  INTERVAL HISTORY:   [name] turns as scheduled.  He continues to have malaise, chills, anorexia, and nausea.  No abdominal pain or diarrhea.  Objective:  Vital signs in last 24 hours:  Blood pressure (!) 155/65, pulse 99, temperature 100.1 F (37.8 C), temperature source Oral, resp. rate 18, height 5' 10"  (1.778 m), weight 161 lb 9.6 oz (73.3 kg), SpO2 100 %.   Resp: Lungs clear bilaterally Cardio: Regular rate and rhythm GI: No hepatomegaly, nontender no mass Vascular: No leg edema   Lab Results:  Lab Results  Component Value Date   WBC 23.7 (H) 01/18/2018   HGB 11.8 (L) 01/18/2018   HCT 37.2 (L) 01/18/2018   MCV 80.9 01/18/2018   PLT 281 01/18/2018   NEUTROABS 21.3 (H) 01/18/2018    CMP  Lab Results  Component Value Date   NA 134 (L) 01/18/2018   K 4.0 01/18/2018   CL 100 01/18/2018   CO2 24 01/18/2018   GLUCOSE 159 (H) 01/18/2018   BUN 12 01/18/2018   CREATININE 1.07 01/18/2018   CALCIUM 9.4 01/18/2018   PROT 7.7 01/18/2018   ALBUMIN 3.1 (L) 01/18/2018   AST 29 01/18/2018   ALT 23 01/18/2018   ALKPHOS 105 01/18/2018   BILITOT 0.4 01/18/2018   GFRNONAA >60 01/18/2018   GFRAA >60 01/18/2018    Lab Results  Component Value Date   CEA1 3.63 09/20/2017    Lab Results  Component Value Date   INR 1.40 12/28/2017    Imaging:  Ct Abdomen Pelvis W Contrast  Result Date: 01/18/2018 CLINICAL DATA:  Metastatic colorectal carcinoma. Pancreatic prot dur to hx of mets to pancreas Pt sts evaluating infection Recent drain placed and removed in December not in Pancreas per pt EXAM: CT ABDOMEN AND PELVIS WITH CONTRAST TECHNIQUE: Multidetector CT imaging of the abdomen and pelvis was performed using the standard protocol following bolus administration of intravenous contrast. CONTRAST:  124m OMNIPAQUE IOHEXOL 300 MG/ML  SOLN COMPARISON:  01/04/2018: Findings: Lower chest: Lung bases are  clear. Hepatobiliary: No focal hepatic lesion. Postcholecystectomy. No biliary dilatation. Pancreas: Normal head body of pancreas. In the tail the pancreas, there is confluent soft tissue mass with central calcifications which joins the gastric body and extends the splenic hilum. Centrally within the soft tissue bridging the stomach and spleen is a low-attenuation 2.7 by 0.9 cm collection which is similar to CT of 01/04/2018 and much improved from CT of 12/24/2017. Interval removal of the percutaneous drainage catheters. Lateral to the dominant mass there is a 2.3 cm collection which compares to 1.9 on prior. (Image 44/2) Spleen: Normal spleen Adrenals/urinary tract: Adrenal glands and kidneys are normal. The ureters and bladder normal. Stomach/Bowel: Stomach is unchanged with inflammatory mass along the proximal gastric body. The duodenum and bowel is unremarkable. Vascular/Lymphatic: Abdominal aorta is normal caliber with atherosclerotic calcification. There is no retroperitoneal or periportal lymphadenopathy. No pelvic lymphadenopathy. Reproductive: Prostate normal Other: No free fluid. Musculoskeletal: No aggressive osseous lesion. IMPRESSION: 1. No change in soft tissue mass at the tail the pancreas which involves the gastric fundus and extends into the splenic hilum. No drainable organized fluid collections. 2. Removal of previous seen percutaneous drainage catheters Electronically Signed   By: SSuzy BouchardM.D.   On: 01/18/2018 15:02    Medications: I have reviewed the patient's current medications.   Assessment/Plan: 1. 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 Negative for a BRAF mutation  Tumor invaded through the muscularis propria into pericolonic fatty tissue and involved the attached omentum.   Cycle 1  adjuvant Xeloda 05/28/2013.   Cycle 2 adjuvant Xeloda 06/18/2013.   Cycle 3 adjuvant Xeloda 07/09/2013.   Cycle 4 adjuvant Xeloda 07/30/2013.   Cycle 5 adjuvant Xeloda 08/20/2013.  Cycle 6 adjuvant Xeloda 09/11/2013.  cycle 7 adjuvant Xeloda 10/02/2013  Cycle 8 adjuvant Xeloda 10/23/2013  CTs of the chest, abdomen, and pelvis on 07/18/2014-negative for recurrent colon cancer   CTs abdomen/pelvis 09/06/2014 showed acute cholecystitis.  Colonoscopy 08/02/2015-chronic colitis, no malignancy  CTs 09/17/2015, new mass near the tail the pancreas, stable lung nodules  Biopsy mesenteric mass 10/04/2015 with metastatic adenocarcinoma consistent with colorectal primary  PET scan 10/10/2015 with soft tissue thickening within the peritoneal space of the left upper quadrant with SUV Max 7.9; no additional sites of peritoneal nodularity to suggest metastasis; hypermetabolic ill-defined nodule in the left lower lobe favored inflammatory.  Resection of the left abdominal mesenteric mass/tail the pancreas on 11/05/2015 with the pathology confirming metastatic colon cancer, positive "serosal" margin  CTs 07/29/2016-no evidence of metastatic disease, stable small lung nodules, decreased size of pancreatic resection bed fluid/mass  CTs 06/28/2017-decreased fluid and increased soft tissue at the area of the distal pancreatectomy with a few foci of gas  CT 09/29/2017-enlargement of left upper quadrant soft tissue mass between the stomach, spleen, and pancreas tail, stable nodules at the lung bases  CT biopsy of the upper quadrant mass 10/19/2017-metastatic colon cancer, MSS, tumor mutation burden 1, no KRAS, BRAF,  or NRAS mutation  CTs 12/24/2017- complex left upper quadrant process involving the stomach, spleen, tail the pancreas, and distal transverse colon. 2. Ulcerative colitis-extensive chronic active ulcerative colitis was noted on the colon resection specimen 03/28/2013.  Followed by  Dr.Magod 3. Hypertension.  4. History of microcytic anemia-likely iron deficiency, unable to tolerate oral iron. Improved. 5. Possible area of cecal wall thickening noted on abdominal CT 03/20/2013. 6. Family history of colon cancer (maternal grandfather died in his 20s with colon cancer). 7. Bilateral calf and low anterior leg pain. Bilateral lower extremity venous duplex negative for DVT 07/14/2013. Right ABI with moderate, borderline severe arterial insufficiency; left ABI suggestive of moderate arterial insufficiency. He was referred to vascular. 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 TEE showed thrombus in the descending aorta. He underwent right popliteal to peroneal artery bypass graft, thrombectomy anterior tibialis and attempted thrombectomy tibioperoneal trunk and posterior tibialis on 08/11/2013. Right calf pain 09/26/2013 with findings of occlusion of right popliteal to peroneal bypass status post thrombolysis. Now on anticoagulation with Xarelto.  recurrent pain and ulceration at the right foot, status post a popliteal to posterior tibial artery bypass using a cephalic vein graft on 60/63/0160 8. Status post laparoscopic cholecystectomy 09/08/2014 9. Anorexia following the mesenteric mass/pancreas tail resection-improved 10. Abdominal abscess November 2017-pancreas "cysts "drainage catheter placed 11/26/2015; CT 02/04/2016 with no change to slight decreasein size of residual pancreatic tail fluid collection.Drainage catheter removed approximately early March 2018  CT 02/20/2016-new small left pleural effusion, increased size of the surgical bed fluid collection, indeterminate pulmonary nodules  CT 07/29/2016-decreased fluid/soft tissue density near the pancreas is no evidence of metastatic disease, stable subcentimeter lung nodules 11.  Admission 12/24/2017 with an abdominal abscess, status post placement of a percutaneous drain and antibiotic therapy; CTs  abdomen/pelvis 01/04/2018-  near complete drainage of previously noted complex fluid collection in the left upper quadrant.  2 small residual collections remain.  Residual soft tissue mass at the clips concerning for residual recurrent neoplasm.  Peritoneal wall thickening also concerning for metastatic disease. Drain removed 01/04/2018  CT 01/18/2018- change in soft tissue mass at the tail of pancreas with involvement of the gastric fundus and spleen, there is central low attenuation   Disposition: [name] has metastatic colon cancer.  He has not started treatment for colon cancer since being diagnosed with recurrent disease in October 2019.  The plan was to begin chemotherapy earlier this year, but he was admitted in December with a left upper quadrant abscess.  The abscess drain was removed 01/04/2018.  He again has chills and failure to thrive.  I suspect there is a persistent abdominal infection.  A resistant E. coli was cultured from the abscess cavity 12/27/2017. The nausea and anorexia are likely related to the left upper quadrant process with potential involvement of the stomach.  He is not a candidate for chemotherapy in his current condition.  I contacted the medical service.  He will be admitted for further evaluation.  Betsy Coder, MD  01/20/2018  11:49 AM

## 2018-01-20 NOTE — H&P (Addendum)
[facility] History and Physical  TRASE BUNDA LSL:[id number] DOB: 11/13/1945 DOA: 01/20/2018  Referring physician: ED  PCP: Midge Minium, MD   Chief Complaint: Fever, chills  HPI: Cody Suarez is a 73 y.o. male with past medical history of metastatic colon cancer with  resection at the splenic flexure in the past in 2015, ulcerative colitis, peripheral vascular disease on anticoagulation, hypertension, status post distal pancreatectomy 2017 secondary to metastatic disease presented to the oncology clinic with complaints of persistent malaise, chills, anorexia and nausea.  Of note, patient recently had a intra-abdominal drain for abscess which was removed end of December after a fistulogram which showed resolution of abscess cavity.  Since his removal, patient has not been eating well and has been feeling mostly fatigued and weak.  Recently has been complaining of nausea, fever and chills.  Patient however denies any abdominal pain.  Denies changes in his bowel habits.  Patient denies urinary urgency, frequency or dysuria.  Patient denies shortness of breath, cough or chest pain.  Denies dizziness, lightheadedness.  Recently, he has been having mild difficulty ambulating largely because of diminished appetite.  I spoke with Dr. Learta Codding, oncology who recommended further evaluation in the hospital.  Patient was then considered for admission from the oncology office directly.   Review of Systems:  All systems were reviewed and were negative unless otherwise mentioned in the HPI  Past Medical History:  Diagnosis Date  . Anemia of chronic disease 2015   "related to cancer tx" (08/09/2013)  . Blood clot in vein 2015  . Colon cancer ([place]) 03/2013  . History of kidney stones    x 2passed them both  . Hypertension   . Peripheral vascular disease ([place])   . Ulcerative colitis ([employer])    history   Past Surgical History:  Procedure Laterality Date  . ABDOMINAL AORTAGRAM N/A 08/09/2013   Procedure: ABDOMINAL Maxcine Ham;  Surgeon: [name], MD;  Location: [facility] CATH LAB;  Service: Cardiovascular;  Laterality: N/A;  . CHOLECYSTECTOMY N/A 09/08/2014   Procedure: LAPAROSCOPIC CHOLECYSTECTOMY WITH INTRAOPERATIVE CHOLANGIOGRAM;  Surgeon: Armandina Gemma, MD;  Location: [facility] ORS;  Service: General;  Laterality: N/A;  . COLON SURGERY  02/2013  . COLON SURGERY  10/2015  . FEMORAL-POPLITEAL BYPASS GRAFT Right 08/11/2013   Procedure:   RIGHT - POPLITEAL TO PERONEAL ARTERY BYPASS GRAFT  WITH NONREVERSED SAPHENOUS VEIN GRAFT,tHROMBECTOMY ANTERIOR TIBIALIS,ATTEMPTED THROMBECTOMY TIBIO-PERONEAL TRUNK AND POSTERIOR TIBIALIS, INTRAOPERATIVE ARTERIOGRAM.;  Surgeon: Mal Misty, MD;  Location: [facility];  Service: Vascular;  Laterality: Right;  . FEMORAL-TIBIAL BYPASS GRAFT Right 11/17/2013   Procedure: BYPASS GRAFT RIGHT ABOVE KNEE POPLITEAL TO POSTERIOR TIBIAL ARTERY USING RIGHT NON-REVERSED CEPHALIC VEIN;  Surgeon: Mal Misty, MD;  Location: [facility];  Service: Vascular;  Laterality: Right;  . HERNIA REPAIR  ~ 1995; 03/2013   UHR  . INTRAOPERATIVE ARTERIOGRAM Right 11/17/2013   Procedure: INTRA OPERATIVE ARTERIOGRAM;  Surgeon: Mal Misty, MD;  Location[facility] OR;  Service: Vascular;  Laterality: Right;  . IR GENERIC HISTORICAL  12/11/2015   IR RADIOLOGIST EVAL & MGMT 12/11/2015 Arne Cleveland, MD [facility] INTER[facility]  . IR GENERIC HISTORICAL  12/24/2015   IR RADIOLOGIST EVAL & MGMT 12/24/2015 Jacqulynn Cadet, MD [facility]  . IR GENERIC HISTORICAL  01/21/2016   IR RADIOLOGIST EVAL & MGMT 01/21/2016 [name], MD [facility]  . IR GENERIC HISTORICAL  02/04/2016   IR RADIOLOGIST EVAL & MGMT 02/04/2016 Sandi Mariscal, MD [facility] INTER[facility]  .  IR GENERIC HISTORICAL  02/12/2016   IR CATHETER TUBE CHANGE 02/12/2016 Corrie Mckusick, DO [facility]  . IR GENERIC HISTORICAL  02/20/2016   IR RADIOLOGIST EVAL & MGMT 02/20/2016 Markus Daft, MD [facility] INTER[facility]  . IR SINUS/FIST TUBE CHK-NON GI  01/04/2018  .  LAPAROSCOPIC RIGHT HEMI COLECTOMY N/A 03/28/2013   Procedure: LAPAROSCOPIC ASSISTED HEMI COLECTOMY;  Surgeon: Pedro Earls, MD;  Location: [facility];  Service: General;  Laterality: N/A;  . LAPAROSCOPY N/A 11/05/2015   Procedure: LAPAROSCOPY DIAGNOSTIC;  Surgeon: [name], MD;  Location: [facility];  Service: General;  Laterality: N/A;  . LAPAROTOMY N/A 11/05/2015   Procedure: EXPLORATORY LAPAROTOMY, resection of mass at tail of pancreas;  Surgeon: [name], MD;  Location: [facility];  Service: General;  Laterality: N/A;  . LOWER EXTREMITY ANGIOGRAM Bilateral 08/09/2013  . TEE WITHOUT CARDIOVERSION N/A 08/10/2013   Procedure: TRANSESOPHAGEAL ECHOCARDIOGRAM (TEE);  Surgeon: Candee Furbish, MD;  Location: [facility] ENDOSCOPY;  Service: Cardiovascular;  Laterality: N/A;  . TONSILLECTOMY  ~ 1950  . [place]; 03/2013  . VASECTOMY    . VEIN HARVEST Right 11/17/2013   Procedure: HARVEST OF RIGHT UPPER EXTREMITY CEPHALIC VEIN;  Surgeon: Mal Misty, MD;  Location: [place] [facility];  Service: Vascular;  Laterality: Right;    Social History:  reports that he has never smoked. He has never used smokeless tobacco. He reports that he does not drink alcohol or use drugs.  Allergies  Allergen Reactions  . Amlodipine Swelling    Right LE edema  . Gabapentin Rash    Short-term memory decrease    Family History  Problem Relation Age of Onset  . Heart disease Mother   . Emphysema Father      Prior to Admission medications   Medication Sig Start Date End Date Taking? Authorizing Provider  acetaminophen (TYLENOL) 500 MG tablet Take 1,000 mg by mouth daily as needed for headache (pain).     [provider]  Coenzyme Q10 (COQ10 PO) Take 1 capsule by mouth daily.    [provider]  diphenhydramine-acetaminophen (TYLENOL PM) 25-500 MG TABS tablet Take 2 tablets by mouth at bedtime as needed (sleep).    [provider]  Homeopathic Products ([place] EX) Apply 1  application topically daily as needed (calf pain).    [provider]  Liniments (BLUE-EMU SUPER STRENGTH EX) Apply 1 application topically daily as needed (knee pain).    [provider]  losartan (COZAAR) 100 MG tablet Take 0.5 tablets (50 mg total) by mouth daily. 10/21/17   [name], MD  Multiple Vitamin (MULTIVITAMIN WITH MINERALS) TABS tablet Take 1 tablet by mouth daily.    [provider]  ondansetron (ZOFRAN) 4 MG tablet Take 1 tablet (4 mg total) by mouth every 8 (eight) hours as needed for nausea or vomiting. 01/12/18   Midge Minium, MD  oxyCODONE-acetaminophen (PERCOCET/ROXICET) 5-325 MG tablet Take 1-2 tablets by mouth every 4 (four) hours as needed for moderate pain or severe pain. Patient not taking: Reported on 01/18/2018 12/28/17   Shelly Coss, MD  predniSONE (DELTASONE) 5 MG tablet Take 5 mg by mouth daily.  03/29/17   [provider]  rivaroxaban (XARELTO) 20 MG TABS tablet Take 1 tablet (20 mg total) by mouth daily with breakfast. Lot [id number]  06/2019 x2 11/26/17   [name], MD  spironolactone (ALDACTONE) 25 MG tablet TAKE 1 TABLET BY MOUTH ONCE DAILY Patient taking differently: Take 25  mg by mouth daily.  12/08/17   [name], MD  sulfaSALAzine (AZULFIDINE) 500 MG EC tablet Take 1,500 mg by mouth daily.  04/23/17   [provider]    Physical Exam: Vitals:   01/20/18 1516 01/20/18 1535  BP: (!) 126/99   Pulse: 96   Resp: 18   Temp: 98.8 F (37.1 C)   TempSrc: Oral   SpO2: 98%   Weight:  70.6 kg  Height:  5' 10"  (1.778 m)   Wt Readings from Last 3 Encounters:  01/20/18 70.6 kg  01/20/18 73.3 kg  01/18/18 72.5 kg   Body mass index is 22.33 kg/m.  General:  Average built, not in obvious distress HENT: Normocephalic, pupils equally reacting to light and accommodation.  No scleral pallor or icterus noted. Oral mucosa is moist.  Chest:  Clear breath sounds.  Diminished breath sounds  bilaterally. No crackles or wheezes.  CVS: S1 &S2 heard. No murmur.  Regular rate and rhythm. Abdomen: Soft, nontender, nondistended.  Mildly obese bowel sounds are heard.  Liver is not palpable, no abdominal mass palpated Extremities: No cyanosis, clubbing or edema.  Peripheral pulses are palpable. Psych: Alert, awake and oriented, normal mood CNS:  No cranial nerve deficits.  Power equal in all extremities.   No cerebellar signs.   Skin: Warm and dry.  No rashes noted.  Labs on Admission:  Basic Metabolic Panel: Recent Labs  Lab 01/18/18 1027  NA 134*  K 4.0  CL 100  CO2 24  GLUCOSE 159*  BUN 12  CREATININE 1.07  CALCIUM 9.4   Liver Function Tests: Recent Labs  Lab 01/18/18 1027  AST 29  ALT 23  ALKPHOS 105  BILITOT 0.4  PROT 7.7  ALBUMIN 3.1*   Recent Labs  Lab 01/18/18 1027  LIPASE 27  AMYLASE 44   No results for input(s): AMMONIA in the last 168 hours. CBC: Recent Labs  Lab 01/14/18 1056 01/18/18 1027  WBC 16.7* 23.7*  NEUTROABS 12.8* 21.3*  HGB 12.0* 11.8*  HCT 37.4* 37.2*  MCV 81.2 80.9  PLT 442.0* 281   Cardiac Enzymes: No results for input(s): CKTOTAL, CKMB, CKMBINDEX, TROPONINI in the last 168 hours.  BNP (last 3 results) No results for input(s): BNP in the last 8760 hours.  ProBNP (last 3 results) No results for input(s): PROBNP in the last 8760 hours.  CBG: No results for input(s): GLUCAP in the last 168 hours.   Radiological Exams on Admission: No results found.  EKG: No EKG available for review  Assessment/Plan Principal Problem:   Intra-abdominal abscess [facility]) Active Problems:   Cancer of splenic flexure colon s/p lap colectomy 03/29/2013   Ulcerative colitis ([place])   Hypertension   Metastatic colon adenocarcinoma to pancreas s/p distal pancreatectomy 11/05/2015   Hyperlipidemia   Fever chills with a significantly elevated white count.  Possibility of intra-abdominal infection.  Patient recently had intra-abdominal abscess  with a drain.  CT scan from 01/18/2018 shows necrotic mass with area of fluid collection which does not appear to be bigger than the last time.  I had a prolonged discussion with Dr. Lucia Gaskins surgery and went over the CT scan report together.  He recommended IR consultation for possible aspiration.  There is a possibility that the soft tissue mass is metastatic tumor with necrotic component to it.  But due to presence of fever and leukocytosis will consider cefepime.  Patient did have E. coli that was resistant to sulfa and penicillin in the past.  Put the patient on liquid diet today.  Follow-up blood cultures that have been sent.  History of splenic flexure colon status post colectomy 03/29/2013, status post distal pancreatectomy 11/05/2015.  Patient has been following up with Dr. Learta Codding oncology.  There was a tentative plan for chemotherapy but due to infection and abscess this has been postponed.  History of peripheral vascular disease.  Patient is on anticoagulation with Xarelto as outpatient.  Will hold Xarelto for now pending intervention.  History of ulcerative colitis.  Patient is on prednisone sulfasalazine as outpatient we will continue with that for now.  Hypertension.  Will closely monitor.  Resume outpatient medication.  On losartan and spironolactone  Consultant: Spoke with Dr Learta Codding oncology, Dr. Lucia Gaskins surgery, consult IR  Code Status: Full code  DVT Prophylaxis: Lovenox  Antibiotics: cefepime, according to previous culture and sensitivity.  Family Communication:  Patients' condition and plan of care including tests being ordered have been discussed with the patient and patient's wife at bedside who indicate understanding and agree with the plan.  Disposition Plan: Home  Severity of Illness: The appropriate patient status for this patient is INPATIENT. Inpatient status is judged to be reasonable and necessary in order to provide the required intensity of service to ensure the  patient's safety. The patient's presenting symptoms, physical exam findings, and initial radiographic and laboratory data in the context of their chronic comorbidities is felt to place them at high risk for further clinical deterioration. Furthermore, it is not anticipated that the patient will be medically stable for discharge from the hospital within 2 midnights of admission. I certify that at the point of admission it is my clinical judgment that the patient will require inpatient hospital care spanning beyond 2 midnights from the point of admission due to high intensity of service, high risk for further deterioration and high frequency of surveillance required.   Signed, Flora Lipps, MD [facility] 01/20/2018

## 2018-01-21 DIAGNOSIS — K651 Peritoneal abscess: Principal | ICD-10-CM

## 2018-01-21 DIAGNOSIS — E440 Moderate protein-calorie malnutrition: Secondary | ICD-10-CM

## 2018-01-21 LAB — CBC
HCT: 32.2 % — ABNORMAL LOW (ref 39.0–52.0)
Hemoglobin: 10 g/dL — ABNORMAL LOW (ref 13.0–17.0)
MCH: 25.9 pg — ABNORMAL LOW (ref 26.0–34.0)
MCHC: 31.1 g/dL (ref 30.0–36.0)
MCV: 83.4 fL (ref 80.0–100.0)
Platelets: 246 10*3/uL (ref 150–400)
RBC: 3.86 MIL/uL — AB (ref 4.22–5.81)
RDW: 19.8 % — ABNORMAL HIGH (ref 11.5–15.5)
WBC: 15.8 10*3/uL — AB (ref 4.0–10.5)
nRBC: 0 % (ref 0.0–0.2)

## 2018-01-21 LAB — URINALYSIS, ROUTINE W REFLEX MICROSCOPIC
Bilirubin Urine: NEGATIVE
Glucose, UA: NEGATIVE mg/dL
Hgb urine dipstick: NEGATIVE
Ketones, ur: 5 mg/dL — AB
Leukocytes, UA: NEGATIVE
Nitrite: NEGATIVE
Protein, ur: NEGATIVE mg/dL
SPECIFIC GRAVITY, URINE: 1.012 (ref 1.005–1.030)
pH: 6 (ref 5.0–8.0)

## 2018-01-21 LAB — COMPREHENSIVE METABOLIC PANEL
ALT: 11 U/L (ref 0–44)
AST: 13 U/L — ABNORMAL LOW (ref 15–41)
Albumin: 2.7 g/dL — ABNORMAL LOW (ref 3.5–5.0)
Alkaline Phosphatase: 60 U/L (ref 38–126)
Anion gap: 12 (ref 5–15)
BUN: 10 mg/dL (ref 8–23)
CO2: 19 mmol/L — ABNORMAL LOW (ref 22–32)
Calcium: 7.8 mg/dL — ABNORMAL LOW (ref 8.9–10.3)
Chloride: 102 mmol/L (ref 98–111)
Creatinine, Ser: 0.92 mg/dL (ref 0.61–1.24)
GFR calc non Af Amer: >60 mL/min (ref >60)
Glucose, Bld: 127 mg/dL — ABNORMAL HIGH (ref 70–99)
Potassium: 3.5 mmol/L (ref 3.5–5.1)
Sodium: 133 mmol/L — ABNORMAL LOW (ref 135–145)
Total Bilirubin: 0.7 mg/dL (ref 0.3–1.2)
Total Protein: 6.4 g/dL — ABNORMAL LOW (ref 6.5–8.1)

## 2018-01-21 LAB — PROTIME-INR
INR: 1.56
Prothrombin Time: 18.5 seconds — ABNORMAL HIGH (ref 11.4–15.2)

## 2018-01-21 MED ORDER — BOOST PLUS PO LIQD
237.0000 mL | Freq: Three times a day (TID) | ORAL | Status: DC
  Administered 2018-01-21 – 2018-01-23 (×6): 237 mL via ORAL
  Filled 2018-01-21 (×11): qty 237

## 2018-01-21 MED ORDER — RIVAROXABAN 20 MG PO TABS
20.0000 mg | ORAL_TABLET | Freq: Every day | ORAL | Status: DC
  Administered 2018-01-22 – 2018-01-24 (×3): 20 mg via ORAL
  Filled 2018-01-21 (×3): qty 1

## 2018-01-21 MED ORDER — BOOST / RESOURCE BREEZE PO LIQD CUSTOM
1.0000 | Freq: Three times a day (TID) | ORAL | Status: DC
  Administered 2018-01-21: 1 via ORAL

## 2018-01-21 MED ORDER — METRONIDAZOLE IN NACL 5-0.79 MG/ML-% IV SOLN
500.0000 mg | Freq: Three times a day (TID) | INTRAVENOUS | Status: DC
  Administered 2018-01-21 – 2018-01-24 (×10): 500 mg via INTRAVENOUS
  Filled 2018-01-21 (×10): qty 100

## 2018-01-21 MED ORDER — ADULT MULTIVITAMIN W/MINERALS CH
1.0000 | ORAL_TABLET | Freq: Every day | ORAL | Status: DC
  Administered 2018-01-21 – 2018-01-23 (×3): 1 via ORAL
  Filled 2018-01-21 (×4): qty 1

## 2018-01-21 MED ORDER — MEGESTROL ACETATE 40 MG PO TABS
160.0000 mg | ORAL_TABLET | Freq: Every day | ORAL | Status: DC
  Administered 2018-01-21 – 2018-01-24 (×4): 160 mg via ORAL
  Filled 2018-01-21 (×4): qty 4

## 2018-01-21 NOTE — Progress Notes (Signed)
Patient ID: Cody Suarez, male   DOB: 1945-01-20, 73 y.o.   MRN: [id number] Request received for consideration of left upper abdominal drain placement in patient.  Latest imaging studies have been reviewed by Dr. Anselm Pancoast.  There appear to be 3 very small fluid pockets near the site of previous biopsy and drain placement in this area.  These collections are too small for drain placement at this time.  Previous cultures from area grew E. Coli and staph.  Would continue conservative management with IV antibiotics.  Await ID input. Above discussed with Dr. Benay Spice and patient/family by Dr. Anselm Pancoast [phone]).

## 2018-01-21 NOTE — Progress Notes (Signed)
Initial Nutrition Assessment  DOCUMENTATION CODES:   Non-severe (moderate) malnutrition in context of chronic illness  INTERVENTION:    Boost Plus chocolate TID- Each supplement provides 360kcal and 14g protein.    Magic cup BID with meals, each supplement provides 290 kcal and 9 grams of protein  MVI daily  NUTRITION DIAGNOSIS:   Moderate Malnutrition related to chronic illness, cancer and cancer related treatments as evidenced by energy intake < 75% for > or equal to 1 month, mild fat depletion, moderate muscle depletion, percent weight loss.  GOAL:   Patient will meet greater than or equal to 90% of their needs  MONITOR:   PO intake, Supplement acceptance, Weight trends, Labs, I & O's  REASON FOR ASSESSMENT:   Consult Assessment of nutrition requirement/status  ASSESSMENT:   Patient with PMH significant for metastatic colon cancer s/p resection at splenic flexure (2015), UC, PVD, HTN, s/p distal pancreatectomy (2017) 2/2 to metastatic disease, and recent removal of of abdominal drain for abscess in December. Presents this admission with fever with possibility of intra-abdominal infection.    1/14- CT scan shows necrotic mass with area of fluid collection which does not appear to be bigger  Pt endorses having a loss in appetite since having his drain removed in December. States he would eat bites of softer foods and two Boost Plus daily. Nausea prevented him from eating most of the time. He was recently prescribed liquid megace at home, but could not tolerate the taste. He tolerated clears this am and was advanced to regular. Plan to attempt pill form of megace this admission. Discussed the importance of protein intake for preservation of lean body mass. Pt willing to increase Boost Plus to TID. Discussed with family and pt to increase supplement use to 3-4/day if appetite continues to be poor at home.   Pt reports a UBW of 173 lb and a 15 lb wt loss in one month. Records  indicate pt weighed 177 lb on 12/26/17 and 156 lb this admission (11.9% wt loss in one month, significant for time frame). Nutrition-Focused physical exam completed.   Medications reviewed and include: megace 160 mg,. Prednisone, aldactone, NS @ 100 ml/hr Labs reviewed: Na 133 (L) corrected calcium 8.8 (L)   NUTRITION - FOCUSED PHYSICAL EXAM:    Most Recent Value  Orbital Region  No depletion  Upper Arm Region  Moderate depletion  Thoracic and Lumbar Region  Unable to assess  Buccal Region  Mild depletion  Temple Region  Mild depletion  Clavicle Bone Region  Moderate depletion  Clavicle and Acromion Bone Region  Moderate depletion  Scapular Bone Region  Unable to assess  Dorsal Hand  Mild depletion  Patellar Region  Moderate depletion  Anterior Thigh Region  Moderate depletion  Posterior Calf Region  Moderate depletion  Edema (RD Assessment)  None     Diet Order:   Diet Order            Diet regular Room service appropriate? Yes; Fluid consistency: Thin  Diet effective now              EDUCATION NEEDS:   Education needs have been addressed  Skin:  Skin Assessment: Reviewed RN Assessment  Last BM:  01/21/17  Height:   Ht Readings from Last 1 Encounters:  01/20/18 5' 10"  (1.778 m)    Weight:   Wt Readings from Last 1 Encounters:  01/20/18 70.6 kg    Ideal Body Weight:  75.5 kg  BMI:  Body  mass index is 22.33 kg/m.  Estimated Nutritional Needs:   Kcal:  2200-2400 kcal  Protein:  105-120 grams  Fluid:  >/= 2.2 L/day   Mariana Single RD, LDN Clinical Nutrition Pager # - [phone]

## 2018-01-21 NOTE — Progress Notes (Signed)
IP PROGRESS NOTE  Subjective:   Cody Suarez was admitted yesterday with fever, chills, and failure to thrive.  He reports feeling "weak ".  He has anorexia and nausea.  He requests an appetite stimulant.  Objective: Vital signs in last 24 hours: Blood pressure 128/66, pulse 91, temperature 99 F (37.2 C), temperature source Oral, resp. rate 18, height 5' 10"  (1.778 m), weight 155 lb 10.3 oz (70.6 kg), SpO2 99 %.  Intake/Output from previous day: 01/16 0701 - 01/17 0700 In: 1258.9 [I.V.:1058.7; IV Piggyback:200.1] Out: -   Physical Exam:  HEENT: No thrush Lungs: Scattered bronchial sounds, no respiratory distress Cardiac: Regular rate and rhythm Abdomen: Nontender, no hepatosplenomegaly, no mass Extremities: No leg edema   Lab Results: Recent Labs    01/18/18 1027 01/21/18 0408  WBC 23.7* 15.8*  HGB 11.8* 10.0*  HCT 37.2* 32.2*  PLT 281 246    BMET Recent Labs    01/20/18 1617 01/21/18 0408  NA 133* 133*  K 3.6 3.5  CL 99 102  CO2 24 19*  GLUCOSE 119* 127*  BUN 11 10  CREATININE 1.02 0.92  CALCIUM 8.4* 7.8*    Lab Results  Component Value Date   CEA1 3.63 09/20/2017     Medications: I have reviewed the patient's current medications.  Assessment/Plan: 1. 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 Negative for a BRAF mutation  Tumor invaded through the muscularis propria into pericolonic fatty tissue and involved the attached omentum.   Cycle 1 adjuvant Xeloda 05/28/2013.   Cycle 2 adjuvant Xeloda 06/18/2013.   Cycle 3 adjuvant Xeloda 07/09/2013.   Cycle 4 adjuvant Xeloda 07/30/2013.   Cycle 5 adjuvant Xeloda 08/20/2013.  Cycle 6 adjuvant Xeloda 09/11/2013.  cycle 7 adjuvant Xeloda 10/02/2013  Cycle 8 adjuvant Xeloda 10/23/2013  CTs of the chest, abdomen, and  pelvis on 07/18/2014-negative for recurrent colon cancer   CTs abdomen/pelvis 09/06/2014 showed acute cholecystitis.  Colonoscopy 08/02/2015-chronic colitis, no malignancy  CTs 09/17/2015, new mass near the tail the pancreas, stable lung nodules  Biopsy mesenteric mass 10/04/2015 with metastatic adenocarcinoma consistent with colorectal primary  PET scan 10/10/2015 with soft tissue thickening within the peritoneal space of the left upper quadrant with SUV Max 7.9; no additional sites of peritoneal nodularity to suggest metastasis; hypermetabolic ill-defined nodule in the left lower lobe favored inflammatory.  Resection of the left abdominal mesenteric mass/tail the pancreas on 11/05/2015 with the pathology confirming metastatic colon cancer, positive "serosal" margin  CTs 07/29/2016-no evidence of metastatic disease, stable small lung nodules, decreased size of pancreatic resection bed fluid/mass  CTs 06/28/2017-decreased fluid and increased soft tissue at the area of the distal pancreatectomy with a few foci of gas  CT 09/29/2017-enlargement of left upper quadrant soft tissue mass between the stomach, spleen, and pancreas tail, stable nodules at the lung bases  CT biopsy of the upper quadrant mass 10/19/2017-metastatic colon cancer, MSS, tumor mutation burden 1, no KRAS, BRAF, or NRAS mutation  CTs 12/24/2017- complex left upper quadrant process involving the stomach, spleen, tail the pancreas, and distal transverse colon. 2. Ulcerative colitis-extensive chronic active ulcerative colitis was noted on the colon resection specimen 03/28/2013.  Followed by Dr.Magod 3. Hypertension.  4. History of microcytic anemia-likely iron deficiency, unable to tolerate oral iron. Improved. 5. Possible area of cecal wall thickening noted on abdominal CT 03/20/2013. 6. Family history of colon  cancer (maternal grandfather died in his 56s with colon cancer). 7. Bilateral calf and low anterior leg pain.  Bilateral lower extremity venous duplex negative for DVT 07/14/2013. Right ABI with moderate, borderline severe arterial insufficiency; left ABI suggestive of moderate arterial insufficiency. He was referred to vascular. 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 TEE showed thrombus in the descending aorta. He underwent right popliteal to peroneal artery bypass graft, thrombectomy anterior tibialis and attempted thrombectomy tibioperoneal trunk and posterior tibialis on 08/11/2013. Right calf pain 09/26/2013 with findings of occlusion of right popliteal to peroneal bypass status post thrombolysis. Now on anticoagulation with Xarelto.  recurrent pain and ulceration at the right foot, status post a popliteal to posterior tibial artery bypass using a cephalic vein graft on 67/61/9509 8. Status post laparoscopic cholecystectomy 09/08/2014 9. Anorexia following the mesenteric mass/pancreas tail resection-improved 10. Abdominal abscess November 2017-pancreas "cysts "drainage catheter placed 11/26/2015; CT 02/04/2016 with no change to slight decreasein size of residual pancreatic tail fluid collection.Drainage catheter removed approximately early March 2018  CT 02/20/2016-new small left pleural effusion, increased size of the surgical bed fluid collection, indeterminate pulmonary nodules  CT 07/29/2016-decreased fluid/soft tissue density near the pancreas is no evidence of metastatic disease, stable subcentimeter lung nodules 11.Admission 12/24/2017 with an abdominal abscess, status post placement of a percutaneous drain and antibiotic therapy; CTs abdomen/pelvis 01/04/2018- near complete drainage of previously noted complex fluid collection in the left upper quadrant.  2 small residual collections remain.  Residual soft tissue mass at the clips concerning for residual recurrent neoplasm.  Peritoneal wall thickening also concerning for metastatic disease. Drain removed 01/04/2018  CT  01/18/2018- no change in soft tissue mass at the tail of pancreas with involvement of the gastric fundus and spleen, there is central low attenuation  Admission 01/20/2018 with fever and failure to thrive   Cody Suarez has metastatic colon cancer.  A left abdomen mesenteric mass was biopsied in October 2019 with the pathology confirming recurrent colon cancer.  He has not been treated.  He developed a left upper quadrant abscess in December.  He presented with failure to thrive.  His performance status improved when a drain was placed and he began antibiotic therapy.  The drain was removed and he again has fever and failure to thrive.  I suspect his symptoms are related to persistence of the abdominal infection.  I discussed the case with interventional radiology.  They will see him today.  He may benefit from an infectious disease consult.  Recommendations: 1.  Interventional radiology evaluation 2.  Continue antibiotics, consider infectious disease consult 3.  Begin trial of Megace as an appetite stimulant 4.  Please call Oncology as needed over the weekend.  I will check on him 01/24/2018 if he remains in the hospital.      LOS: 1 day   Betsy Coder, MD   01/21/2018, 7:38 AM

## 2018-01-21 NOTE — Progress Notes (Signed)
[facility] PROGRESS NOTE  Cody Suarez RWE:[id number] DOB: Nov 02, 1945 DOA: 01/20/2018 PCP: Midge Minium, MD   Narrative: 73 cm metastatic colon cancer resection 2015+ distal pancreatectomy 2017 2/2 metastatic disease-also intra-abdominal drain for abscess removed 01/04/18 with resolution of abscess cavity-anorexia, chills malaise, lethargy and was admitted as a direct admit from oncology office 1/17-grew E. coli resistant to sulfa penicillin cultured from the cavity 12/27/2017 Last imaging study showed no change in soft tissue mass to the pancreas but involves gastric fundus extends into splenic hilum but no drainable organized fluid collections and remove the previously seen percutaneous drainage catheters  Detailed discussion with general surgery entertained on admission by admitting physician and it was felt that there was a drainable area of abscess- He is not a candidate for chemotherapy  Other comorbid = extensive active ulcerative colitis, microcytic anemia, PAD status post pop-peroneal bypass thrombectomy anterior tibialis 06/1948 complicated by ulcerations  On admission found to have leukocytosis WBC 23-->15 mild anemia hemoglobin 10 range down from 11-12 BUN/creatinine 10/0.9 LFTs normal  A & Plan Intra-abdominal abscess Further discussion general surgery 1/17 reveals not a good candidate for surgery and IR does not feel they can access this area well Continue cefepime, added Flagyl 1/17 in addition-we will contact once culture data are available from labs possible duration of therapy-leaning towards at least 3 weeks given outpatient failure Will start regular diet and see how he does Metastatic stage IIC, T4N0 colon cancer, transverse colectomy 2015, distal pancreatectomy with excision of mesenteric metastases 11/2015 and numerous drains in the past Not a clear-cut case for surgeon to operate-CT confluent soft tissue mass with calcifications in the gastric body and  splenic hilum low-attenuation 2.7 x 0.9 collection with lateral collection 2.3 cm compared to 1.9 in the past Antibiotic management and further discussion as an outpatient with reimaging probably in the outpatient setting History of peripheral vascular disease status post interventions in 2015 Continue Xarelto 20 daily History of ulcerative colitis Not currently on meds for this-can continue sulfasalazine 1500 daily as an outpatient Would continue low-dose prednisone 5 mg daily HTN Continue losartan 50 mg daily, Aldactone 25 daily Microcytic anemia Stable at this time Moderate malnutrition BMI 22 In the setting of poor diet we will monitor   DVT resumed on Xarelto code Status: Full code communication: Discussed clearly with family at the bedside disposition Plan: Inpatient pending resolution culture data in order to narrow appropriately   Verlon Au, MD  [facility] Via [employer] app OR [email or web address] 7PM-7AM contact night coverage as above 01/21/2018, 7:38 AM  LOS: 1 day   Consultants:  Oncology  Surgery  Radiology  Procedures:  None  Antimicrobials:  Cefepime since 1/16  Flagyl since 1/17  Interval history/Subjective: Awake weak oriented no distress hungry No chest pain afebrile  Objective:  Vitals:  Vitals:   01/20/18 2059 01/21/18 0530  BP: (!) 139/59 128/66  Pulse: 90 91  Resp: 18 18  Temp: 99.1 F (37.3 C) 99 F (37.2 C)  SpO2: 94% 99%    Exam:  EOMI NCAT no distress no pallor no icterus Abdomen soft no rebound no guarding Chest is clinically clear without added sound S1-S2 no murmur rub or gallop on monitor Normal sinus rhythm Stable telemetry was discontinued    I have personally reviewed the following:  DATA   Labs:  Sodium 133 same as prior CO2 19 rest of chemical panel normal  WBC 15.8 hemoglobin 10 platelet 246-WBC is down from 23  Scheduled Meds: .  lactose free nutrition  237 mL Oral TID WC  . losartan  50 mg Oral Daily  .  megestrol  160 mg Oral Daily  . multivitamin with minerals  1 tablet Oral Daily  . predniSONE  5 mg Oral QAC breakfast  . [START ON 01/22/2018] rivaroxaban  20 mg Oral Q breakfast  . spironolactone  25 mg Oral Daily  . sulfaSALAzine  1,500 mg Oral Daily   Continuous Infusions: . sodium chloride 100 mL/hr at 01/21/18 1306  . ceFEPime (MAXIPIME) IV Stopped (01/21/18 1006)  . metronidazole Stopped (01/21/18 1255)    Principal Problem:   Intra-abdominal abscess ([place]) Active Problems:   Cancer of splenic flexure colon s/p lap colectomy 03/29/2013   Ulcerative colitis ([place])   Hypertension   Metastatic colon adenocarcinoma to pancreas s/p distal pancreatectomy 11/05/2015   Hyperlipidemia   LOS: 1 day

## 2018-01-21 NOTE — Plan of Care (Signed)
Pt was given 4 mg Zofran at the beginning of the shift and it was effective.Pt asked for Benadryl to help him sleep. Pt rested well during the shift.

## 2018-01-21 NOTE — Consult Note (Addendum)
Reason for Consult: Intra-abdominal abscess Referring Physician: Dr. Nita Sells General surgery: Dr. [name] Oncology: Dr. Betsy Coder  Cody Suarez is an 73 y.o. male.    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  He has had a partial colectomy 03/28/2013 by Dr. Hassell Done, and underwent chemotherapy.  He underwent a second procedure including resection of abdominal mesenteric mask/tail the pancreas on 11/05/2015 confirming metastatic cancer.  Both surgeries were by Dr. [name].     He has a history of ulcerative colitis.  He presented 12/24/2017, with malaise, decreased appetite, chills, and fever.  CT scan revealed an intra-abdominal mass.  On 12/27/2017 IR drain was placed.  Cultures revealed a ampicillin resistant E. coli.  He was discharged home on antibiotics.  Repeat CT scan and injection showed a small amount of contrast fills around the catheter there is no residual collection no evidence of a bowel fistula.  The drain was removed.     He was seen in the office by Dr. Benay Spice yesterday, continues to have malaise, chills, anorexia, and nausea.  Plans were to restart chemotherapy at the first of this year.  But with those symptoms he was sent to the ED for further evaluation.  CT scan shows normal head of the pancreas in the tail the pancreas there is a confluent soft tissue mass with central calcifications which joins the gastric body and extends to the splenic hilum centrally within the soft tissue bridging of stomach and the spleen is a low-attenuation 2.7 x 0.9 cm collection similar to 1 on 1231.  Much improved from the CT of 12/2017.  There is also lateral collection that measures 2.3 cm in diameter which compares to 1.9 in the past.  The impression was no change in the soft tissue mass at the tail the pancreas involving the gastric fundus and extends to the splenic hilum.  No  drainable organized fluid collections. Additional work-up shows a low-grade fever on of admission 100.1.  Mildly tachycardic blood pressure stable.  His BMI is 22.3 BBC is elevated at 15.8, hemoglobin 10, hematocrit 32, platelets 246,000.  The CMP is essentially normal.  He has been admitted to the medicine service and placed on Maxipime.  Patient reports after discharge and completing the antibiotics ordered by PCP he was doing better.  Since the drain was pulled by IR on 12/31, he has declined, with low grade fevers, chills lasting up to an hour, decreased appetite, weight loss, and just generally feels bad.  We are asked to see.  Past Medical History:  Diagnosis Date  . Anemia of chronic disease 2015   "related to cancer tx" (08/09/2013)  . Blood clot in vein 2015  . Colon cancer ([place]) 03/2013  . History of kidney stones    x 2passed them both  . Hypertension   . Peripheral vascular disease ([place])   . Ulcerative colitis ([place])    history    Past Surgical History:  Procedure Laterality Date  . ABDOMINAL AORTAGRAM N/A 08/09/2013   Procedure: ABDOMINAL Maxcine Ham;  Surgeon: [name], MD;  Location: [facility] C[facility];  Service: Cardiovascular;  Laterality: N/A;  . CHOLECYSTECTOMY N/A 09/08/2014   Procedure: LAPAROSCOPIC CHOLECYSTECTOMY WITH INTRAOPERATIVE CHOLANGIOGRAM;  Surgeon: Armandina Gemma, MD;  Location: [facility];  Service: General;  Laterality: N/A;  . COLON SURGERY  02/2013  . COLON SURGERY  10/2015  . FEMORAL-POPLITEAL BYPASS GRAFT Right 08/11/2013   Procedure:  RIGHT - POPLITEAL TO PERONEAL ARTERY BYPASS GRAFT  WITH NONREVERSED SAPHENOUS VEIN GRAFT,tHROMBECTOMY ANTERIOR TIBIALIS,ATTEMPTED THROMBECTOMY TIBIO-PERONEAL TRUNK AND POSTERIOR TIBIALIS, INTRAOPERATIVE ARTERIOGRAM.;  Surgeon: Mal Misty, MD;  Location: [facility];  Service: Vascular;  Laterality: Right;  . FEMORAL-TIBIAL BYPASS GRAFT Right 11/17/2013   Procedure: BYPASS GRAFT RIGHT ABOVE KNEE POPLITEAL TO POSTERIOR TIBIAL ARTERY USING  RIGHT NON-REVERSED CEPHALIC VEIN;  Surgeon: Mal Misty, MD;  Location: [facility];  Service: Vascular;  Laterality: Right;  . HERNIA REPAIR  ~ 1995; 03/2013   UHR  . INTRAOPERATIVE ARTERIOGRAM Right 11/17/2013   Procedure: INTRA OPERATIVE ARTERIOGRAM;  Surgeon: Mal Misty, MD;  Location: [facility] OR;  Service: Vascular;  Laterality: Right;  . IR GENERIC HISTORICAL  12/11/2015   IR RADIOLOGIST EVAL & MGMT 12/11/2015 Arne Cleveland, MD [facility]  . IR GENERIC HISTORICAL  12/24/2015   IR RADIOLOGIST EVAL & MGMT 12/24/2015 Jacqulynn Cadet, MD [facility]  . IR GENERIC HISTORICAL  01/21/2016   IR RADIOLOGIST EVAL & MGMT 01/21/2016 [name], MD [facility]  . IR GENERIC HISTORICAL  02/04/2016   IR RADIOLOGIST EVAL & MGMT 02/04/2016 Sandi Mariscal, MD [facility]  . IR GENERIC HISTORICAL  02/12/2016   IR CATHETER TUBE CHANGE 02/12/2016 Corrie Mckusick, DO [facility]  . IR GENERIC HISTORICAL  02/20/2016   IR RADIOLOGIST EVAL & MGMT 02/20/2016 Markus Daft, MD [facility]  . IR SINUS/FIST TUBE CHK-NON GI  01/04/2018  . LAPAROSCOPIC RIGHT HEMI COLECTOMY N/A 03/28/2013   Procedure: LAPAROSCOPIC ASSISTED HEMI COLECTOMY;  Surgeon: Pedro Earls, MD;  [facility];  Service: General;  Laterality: N/A;  . LAPAROSCOPY N/A 11/05/2015   Procedure: LAPAROSCOPY DIAGNOSTIC;  Surgeon: [name], MD;  Location[facility];  Service: General;  Laterality: N/A;  . LAPAROTOMY N/A 11/05/2015   Procedure: EXPLORATORY LAPAROTOMY, resection of mass at tail of pancreas;  Surgeon: [name], MD;  [facility];  Service: General;  Laterality: N/A;  . LOWER EXTREMITY ANGIOGRAM Bilateral 08/09/2013  . TEE WITHOUT CARDIOVERSION N/A 08/10/2013   Procedure: TRANSESOPHAGEAL ECHOCARDIOGRAM (TEE);  Surgeon: Candee Furbish, MD;  Location: [facility] ENDOSCOPY;  Service: Cardiovascular;  Laterality: N/A;  . TONSILLECTOMY  ~ 1950  . [place]; 03/2013  . VASECTOMY    . VEIN HARVEST Right  11/17/2013   Procedure: HARVEST OF RIGHT UPPER EXTREMITY CEPHALIC VEIN;  Surgeon: Mal Misty, MD;  Location: [facility];  Service: Vascular;  Laterality: Right;    Family History  Problem Relation Age of Onset  . Heart disease Mother   . Emphysema Father     Social History:  reports that he has never smoked. He has never used smokeless tobacco. He reports that he does not drink alcohol or use drugs.  Allergies:  Allergies  Allergen Reactions  . Amlodipine Swelling    Right LE edema  . Gabapentin Rash    Short-term memory decrease    Prior to Admission medications   Medication Sig Start Date End Date Taking? Authorizing Provider  acetaminophen (TYLENOL) 500 MG tablet Take 1,000 mg by mouth daily as needed for mild pain or headache.    Yes [provider]  Coenzyme Q10 (COQ10 PO) Take 1 capsule by mouth daily.   Yes [provider]  Homeopathic Products ([place] EX) Apply 1 application topically daily as needed (calf pain).   Yes [provider]  Liniments (BLUE-EMU SUPER STRENGTH EX) Apply 1 application topically daily  as needed (knee pain).   Yes [provider]  losartan (COZAAR) 100 MG tablet Take 0.5 tablets (50 mg total) by mouth daily. 10/21/17  Yes [name], MD  Multiple Vitamin (MULTIVITAMIN WITH MINERALS) TABS tablet Take 1 tablet by mouth daily.   Yes [provider]  ondansetron (ZOFRAN) 4 MG tablet Take 1 tablet (4 mg total) by mouth every 8 (eight) hours as needed for nausea or vomiting. 01/12/18  Yes Midge Minium, MD  predniSONE (DELTASONE) 5 MG tablet Take 5 mg by mouth daily.  03/29/17  Yes [provider]  rivaroxaban (XARELTO) 20 MG TABS tablet Take 1 tablet (20 mg total) by mouth daily with breakfast. Lot [id number]  06/2019 x2 11/26/17  Yes [name], MD  spironolactone (ALDACTONE) 25 MG tablet TAKE 1 TABLET BY MOUTH ONCE DAILY Patient taking differently: Take 25 mg by mouth daily.   12/08/17  Yes [name], MD  sulfaSALAzine (AZULFIDINE) 500 MG EC tablet Take 1,500 mg by mouth daily.  04/23/17  Yes [provider]  diphenhydramine-acetaminophen (TYLENOL PM) 25-500 MG TABS tablet Take 2 tablets by mouth at bedtime as needed (sleep).    [provider]   . sodium chloride 100 mL/hr at 01/21/18 0815  . ceFEPime (MAXIPIME) IV 2 g (01/21/18 0143)   Anti-infectives (From admission, onward)   Start     Dose/Rate Route Frequency Ordered Stop   01/20/18 1830  ceFEPIme (MAXIPIME) 2 g in sodium chloride 0.9 % 100 mL IVPB     2 g 200 mL/hr over 30 Minutes Intravenous Every 8 hours 01/20/18 1742        Results for orders placed or performed during the hospital encounter of 01/20/18 (from the past 48 hour(s))  Urinalysis, Routine w reflex microscopic     Status: Abnormal   Collection Time: 01/20/18  3:52 PM  Result Value Ref Range   Color, Urine YELLOW YELLOW   APPearance CLEAR CLEAR   Specific Gravity, Urine 1.012 1.005 - 1.030   pH 6.0 5.0 - 8.0   Glucose, UA NEGATIVE NEGATIVE mg/dL   Hgb urine dipstick NEGATIVE NEGATIVE   Bilirubin Urine NEGATIVE NEGATIVE   Ketones, ur 5 (A) NEGATIVE mg/dL   Protein, ur NEGATIVE NEGATIVE mg/dL   Nitrite NEGATIVE NEGATIVE   Leukocytes, UA NEGATIVE NEGATIVE    Comment: Performed at [facility], [place] [address], [place], [place] [address]  Comprehensive metabolic panel     Status: Abnormal   Collection Time: 01/20/18  4:17 PM  Result Value Ref Range   Sodium 133 (L) 135 - 145 mmol/L   Potassium 3.6 3.5 - 5.1 mmol/L   Chloride 99 98 - 111 mmol/L   CO2 24 22 - 32 mmol/L   Glucose, Bld 119 (H) 70 - 99 mg/dL   BUN 11 8 - 23 mg/dL   Creatinine, Ser 1.02 0.61 - 1.24 mg/dL   Calcium 8.4 (L) 8.9 - 10.3 mg/dL   Total Protein 7.4 6.5 - 8.1 g/dL   Albumin 3.2 (L) 3.5 - 5.0 g/dL   AST 18 15 - 41 U/L   ALT 15 0 - 44 U/L   Alkaline Phosphatase 75 38 - 126 U/L   Total Bilirubin 0.5 0.3 - 1.2 mg/dL    GFR calc non Af Amer >60 >60 mL/min   GFR calc Af Amer >60 >60 mL/min   Anion gap 10 5 - 15    Comment: Performed at [facility], [address] [place], [place] [address]  Culture, blood (routine x 2)     Status: None (Preliminary result)   Collection Time: 01/20/18  4:17 PM  Result Value Ref Range   Specimen Description      BLOOD LEFT ARM Performed at [facility] [address], [place], [place] [address]    Special Requests      BOTTLES DRAWN AEROBIC AND ANAEROBIC Blood Culture adequate volume Performed at [facility] [address] [place], [place] [address]    Culture      NO GROWTH < 24 HOURS Performed at [facility] [address], [place], [place] [address]    Report Status PENDING   Culture, blood (routine x 2)     Status: None (Preliminary result)   Collection Time: 01/20/18  4:17 PM  Result Value Ref Range   Specimen Description      BLOOD LEFT ARM Performed at [facility] [address] [place], [place] [address]    Special Requests      BOTTLES DRAWN AEROBIC AND ANAEROBIC Blood Culture adequate volume Performed at [facility] [address], [place], [place] [address]    Culture      NO GROWTH < 24 HOURS Performed at [facility] [address], [place], [place] [address]    Report Status PENDING   Comprehensive metabolic panel     Status: Abnormal   Collection Time: 01/21/18  4:08 AM  Result Value Ref Range   Sodium 133 (L) 135 - 145 mmol/L   Potassium 3.5 3.5 - 5.1 mmol/L   Chloride 102 98 - 111 mmol/L   CO2 19 (L) 22 - 32 mmol/L   Glucose, Bld 127 (H) 70 - 99 mg/dL   BUN 10 8 - 23 mg/dL   Creatinine, Ser 0.92 0.61 - 1.24 mg/dL   Calcium 7.8 (L) 8.9 - 10.3 mg/dL   Total Protein 6.4 (L) 6.5 - 8.1 g/dL   Albumin 2.7 (L) 3.5 - 5.0 g/dL   AST 13 (L) 15 - 41 U/L   ALT 11 0 - 44 U/L   Alkaline Phosphatase 60 38 - 126 U/L   Total  Bilirubin 0.7 0.3 - 1.2 mg/dL   GFR calc non Af Amer >60 >60 mL/min   GFR calc Af Amer >60 >60 mL/min   Anion gap 12 5 - 15    Comment: Performed at [facility], [place] [address] [place], [place] [address]  CBC     Status: Abnormal   Collection Time: 01/21/18  4:08 AM  Result Value Ref Range   WBC 15.8 (H) 4.0 - 10.5 K/uL   RBC 3.86 (L) 4.22 - 5.81 MIL/uL   Hemoglobin 10.0 (L) 13.0 - 17.0 g/dL   HCT 32.2 (L) 39.0 - 52.0 %   MCV 83.4 80.0 - 100.0 fL   MCH 25.9 (L) 26.0 - 34.0 pg   MCHC 31.1 30.0 - 36.0 g/dL   RDW 19.8 (H) 11.5 - 15.5 %   Platelets 246 150 - 400 K/uL   nRBC 0.0 0.0 - 0.2 %    Comment: Performed at [facility], [place] [address] [place],  [address]  Protime-INR     Status: Abnormal   Collection Time: 01/21/18  4:08 AM  Result Value Ref Range   Prothrombin Time 18.5 (H) 11.4 - 15.2 seconds   INR 1.56     Comment: Performed at  [facility], [place] [address], [place],  [address]    No results found.  Review of Systems  Constitutional: Positive for chills, fever, malaise/fatigue and weight loss (20+ lbs since December). Negative for diaphoresis.  HENT: Negative.   Eyes: Negative.   Respiratory: Negative.   Cardiovascular: Negative.   Gastrointestinal: Positive for nausea. Negative for abdominal pain, blood in stool, constipation, diarrhea, heartburn, melena and vomiting.  Genitourinary: Negative.   Musculoskeletal: Negative.   Skin: Negative.   Neurological: Negative.   Endo/Heme/Allergies: Negative.   Psychiatric/Behavioral: Negative.    Blood pressure 128/66, pulse 91, temperature 99 F (37.2 C), temperature source Oral, resp. rate 18, height 5' 10"  (1.778 m), weight 70.6 kg, SpO2 99 %. Physical Exam  Constitutional: He is oriented to person, place, and time. He appears well-developed and well-nourished. No distress.  HENT:  Head: Normocephalic and atraumatic.  Mouth/Throat: Oropharynx is  clear and moist.  Eyes: Conjunctivae are normal. Right eye exhibits no discharge. Left eye exhibits no discharge. No scleral icterus.  Pupils are equal  Neck: Normal range of motion. Neck supple. No JVD present. No tracheal deviation present. No thyromegaly present.  Cardiovascular: Normal rate, regular rhythm, normal heart sounds and intact distal pulses.  No murmur heard. Respiratory: Effort normal and breath sounds normal. No respiratory distress. He has no wheezes. He has no rales. He exhibits no tenderness.  GI: Soft. Bowel sounds are normal. He exhibits no distension and no mass. There is no abdominal tenderness. There is no rebound and no guarding.  Musculoskeletal:        General: No tenderness or edema.  Lymphadenopathy:    He has no cervical adenopathy.  Neurological: He is alert and oriented to person, place, and time. No cranial nerve deficit.  Skin: Skin is warm and dry. No rash noted. He is not diaphoretic. No erythema. No pallor.  Psychiatric: He has a normal mood and affect. His behavior is normal. Judgment and thought content normal.    Assessment/Plan: Intra-abdominal abscess Hx ulcerative colitis -prednisone/sulfasalazine Stage IIc adenocarcinoma: - Chemotherapy  Hx partial colectomy 03/2013; abdominal mesenteric mass/tail of pancreas resection 11/05/2015, Dr. 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  He was sent home on Augmentin last admission and this was changed by the PCP after reviewing the cultures.  Agree with current antibiotics.  Patient's wife said Dr. Benay Spice told him that they would probably see ID today also.  We have not ordered that.    Dr. Lucia Gaskins will review and we will follow with you.  Can  put him on full liquids, he is pretty much living on boost at home  which she likes.  I will see any acute intervention at this point.   Cody Suarez,Cody Suarez 01/21/2018, 9:00 AM   Agree with above. I spoke with Drs. [place] and [place]. There is no immediate surgical issue.  Short term plan is for antibiotics to resolve acute febrile episode.  Alphonsa Overall, MD, [facility] Surgery Pager: [phone] Office phone:  [phone]

## 2018-01-21 NOTE — Evaluation (Signed)
Physical Therapy One Time Evaluation Patient Details Name: Cody Suarez MRN: [id number] DOB: 1945-07-08 Today's Date: 01/21/2018   History of Present Illness  73 y.o. male with past medical history of metastatic colon cancer with resection at the splenic flexure in the past in 2015, ulcerative colitis, peripheral vascular disease on anticoagulation, hypertension, status post distal pancreatectomy 2017 secondary to metastatic disease and admitted for  Intra-abdominal abscess   Clinical Impression  Patient evaluated by Physical Therapy with no further acute PT needs identified. All education has been completed and the patient has no further questions.  Pt ambulated in hallway and reports feeling much better since admission. Pt encouraged to continue ambulating in hallway during acute stay (RN notified pt mobilizing well and has no fx of falls). See below for any follow-up Physical Therapy or equipment needs. PT is signing off. Thank you for this referral.     Follow Up Recommendations No PT follow up    Equipment Recommendations  None recommended by PT    Recommendations for Other Services       Precautions / Restrictions Precautions Precautions: None      Mobility  Bed Mobility Overal bed mobility: Modified Independent                Transfers Overall transfer level: Modified independent                  Ambulation/Gait Ambulation/Gait assistance: Supervision;Modified independent (Device/Increase time) Gait Distance (Feet): 240 Feet(x2) Assistive device: IV Pole Gait Pattern/deviations: Step-through pattern     General Gait Details: pt pushed IV pole, did not appear to need for support, typically doesn't use AD at baseline, no unsteadiness or LOB observed  Stairs            Wheelchair Mobility    Modified Rankin (Stroke Patients Only)       Balance Overall balance assessment: No apparent balance deficits (not formally assessed)(denies any  falls)                                           Pertinent Vitals/Pain Pain Assessment: No/denies pain    Home Living Family/patient expects to be discharged to:: Private residence Living Arrangements: Spouse/significant other Available Help at Discharge: Family Type of Home: House Home Access: Stairs to enter Entrance Stairs-Rails: Can reach both Entrance Stairs-Number of Steps: 4 Home Layout: One level Home Equipment: Environmental consultant - 2 wheels      Prior Function Level of Independence: Independent               Hand Dominance        Extremity/Trunk Assessment        Lower Extremity Assessment Lower Extremity Assessment: Overall WFL for tasks assessed       Communication   Communication: HOH  Cognition Arousal/Alertness: Awake/alert Behavior During Therapy: WFL for tasks assessed/performed Overall Cognitive Status: Within Functional Limits for tasks assessed                                        General Comments      Exercises     Assessment/Plan    PT Assessment Patent does not need any further PT services  PT Problem List         PT Treatment Interventions  PT Goals (Current goals can be found in the Care Plan section)  Acute Rehab PT Goals PT Goal Formulation: All assessment and education complete, DC therapy    Frequency     Barriers to discharge        Co-evaluation               AM-PAC PT "6 Clicks" Mobility  Outcome Measure Help needed turning from your back to your side while in a flat bed without using bedrails?: None Help needed moving from lying on your back to sitting on the side of a flat bed without using bedrails?: None Help needed moving to and from a bed to a chair (including a wheelchair)?: None Help needed standing up from a chair using your arms (e.g., wheelchair or bedside chair)?: None Help needed to walk in hospital room?: None Help needed climbing 3-5 steps with a railing?  : None 6 Click Score: 24    End of Session   Activity Tolerance: Patient tolerated treatment well Patient left: in bed;with bed alarm set;with call bell/phone within reach Nurse Communication: Mobility status PT Visit Diagnosis: Muscle weakness (generalized) (M62.81)    Time: 1420-1440 PT Time Calculation (min) (ACUTE ONLY): 20 min   Charges:   PT Evaluation $PT Eval Low Complexity: [place], PT, DPT Acute Rehabilitation Services Office: [phone] Pager: [phone]  Trena Platt 01/21/2018, 3:21 PM

## 2018-01-22 LAB — COMPREHENSIVE METABOLIC PANEL
ALK PHOS: 55 U/L (ref 38–126)
ALT: 11 U/L (ref 0–44)
AST: 11 U/L — ABNORMAL LOW (ref 15–41)
Albumin: 2.6 g/dL — ABNORMAL LOW (ref 3.5–5.0)
Anion gap: 10 (ref 5–15)
BUN: 8 mg/dL (ref 8–23)
CO2: 20 mmol/L — ABNORMAL LOW (ref 22–32)
Calcium: 7.8 mg/dL — ABNORMAL LOW (ref 8.9–10.3)
Chloride: 106 mmol/L (ref 98–111)
Creatinine, Ser: 0.93 mg/dL (ref 0.61–1.24)
GFR calc Af Amer: >60 mL/min (ref >60)
GFR calc non Af Amer: >60 mL/min (ref >60)
Glucose, Bld: 119 mg/dL — ABNORMAL HIGH (ref 70–99)
Potassium: 3.5 mmol/L (ref 3.5–5.1)
Sodium: 136 mmol/L (ref 135–145)
Total Bilirubin: 0.7 mg/dL (ref 0.3–1.2)
Total Protein: 6.2 g/dL — ABNORMAL LOW (ref 6.5–8.1)

## 2018-01-22 LAB — CBC WITH DIFFERENTIAL/PLATELET
ABS IMMATURE GRANULOCYTES: 0.12 10*3/uL — AB (ref 0.00–0.07)
Basophils Absolute: 0.1 10*3/uL (ref 0.0–0.1)
Basophils Relative: 1 %
Eosinophils Absolute: 0.5 10*3/uL (ref 0.0–0.5)
Eosinophils Relative: 3 %
HCT: 32.7 % — ABNORMAL LOW (ref 39.0–52.0)
Hemoglobin: 10 g/dL — ABNORMAL LOW (ref 13.0–17.0)
Immature Granulocytes: 1 %
Lymphocytes Relative: 9 %
Lymphs Abs: 1.4 10*3/uL (ref 0.7–4.0)
MCH: 25.1 pg — ABNORMAL LOW (ref 26.0–34.0)
MCHC: 30.6 g/dL (ref 30.0–36.0)
MCV: 82 fL (ref 80.0–100.0)
MONO ABS: 1.8 10*3/uL — AB (ref 0.1–1.0)
MONOS PCT: 11 %
Neutro Abs: 11.5 10*3/uL — ABNORMAL HIGH (ref 1.7–7.7)
Neutrophils Relative %: 75 %
Platelets: 298 10*3/uL (ref 150–400)
RBC: 3.99 MIL/uL — ABNORMAL LOW (ref 4.22–5.81)
RDW: 19.5 % — ABNORMAL HIGH (ref 11.5–15.5)
WBC: 15.4 10*3/uL — ABNORMAL HIGH (ref 4.0–10.5)
nRBC: 0 % (ref 0.0–0.2)

## 2018-01-22 LAB — URINE CULTURE: Culture: <10000 — AB

## 2018-01-22 NOTE — Progress Notes (Signed)
   Subjective/Chief Complaint: Some nausea, no pain   Objective: Vital signs in last 24 hours: Temp:  [98.4 F (36.9 C)-99.2 F (37.3 C)] 99.2 F (37.3 C) (01/18 0503) Pulse Rate:  [80-88] 82 (01/18 0503) Resp:  [16-18] 16 (01/18 0503) BP: (126-135)/(61-66) 126/61 (01/18 0503) SpO2:  [96 %-97 %] 96 % (01/18 0503) Weight:  [71.1 kg] 71.1 kg (01/18 0456) Last BM Date: 01/21/18  Intake/Output from previous day: 01/17 0701 - 01/18 0700 In: 2465.6 [P.O.:120; I.V.:2145.6; IV Piggyback:200] Out: -  Intake/Output this shift: No intake/output data recorded.  General appearance: alert and cooperative GI: soft, non-tender; bowel sounds normal; no masses,  no organomegaly reducible incisional hernia   Lab Results:  Recent Labs    01/21/18 0408 01/22/18 0445  WBC 15.8* 15.4*  HGB 10.0* 10.0*  HCT 32.2* 32.7*  PLT 246 298   BMET Recent Labs    01/21/18 0408 01/22/18 0445  NA 133* 136  K 3.5 3.5  CL 102 106  CO2 19* 20*  GLUCOSE 127* 119*  BUN 10 8  CREATININE 0.92 0.93  CALCIUM 7.8* 7.8*   PT/INR Recent Labs    01/21/18 0408  LABPROT 18.5*  INR 1.56   ABG No results for input(s): PHART, HCO3 in the last 72 hours.  Invalid input(s): PCO2, PO2  Studies/Results: No results found.  Anti-infectives: Anti-infectives (From admission, onward)   Start     Dose/Rate Route Frequency Ordered Stop   01/21/18 1200  metroNIDAZOLE (FLAGYL) IVPB 500 mg     500 mg 100 mL/hr over 60 Minutes Intravenous Every 8 hours 01/21/18 1022     01/20/18 1830  ceFEPIme (MAXIPIME) 2 g in sodium chloride 0.9 % 100 mL IVPB     2 g 200 mL/hr over 30 Minutes Intravenous Every 8 hours 01/20/18 1742        Assessment/Plan: Intra-abdominal abscess Hx ulcerative colitis -prednisone/sulfasalazine Stage IIc adenocarcinoma: - Chemotherapy             Hx partial colectomy 03/2013; abdominal mesenteric mass/tail of pancreas resection 11/05/2015, Dr. 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 Current plan is continue abx therapy, consider ID consult. Will continue to follow.    LOS: 2 days    Clovis Riley 01/22/2018

## 2018-01-22 NOTE — Progress Notes (Signed)
[facility] PROGRESS NOTE  MYREON WIMER DPO:[id number] DOB: 1945/09/09 DOA: 01/20/2018 PCP: Midge Minium, MD   Narrative: 73 cm metastatic colon cancer resection 2015+ distal pancreatectomy 2017 2/2 metastatic disease-also intra-abdominal drain for abscess removed 01/04/18 with resolution of abscess cavity-anorexia, chills malaise, lethargy and was admitted as a direct admit from oncology office 1/17-grew E. coli resistant to sulfa penicillin cultured from the cavity 12/27/2017 Last imaging study showed no change in soft tissue mass to the pancreas but involves gastric fundus extends into splenic hilum but no drainable organized fluid collections and remove the previously seen percutaneous drainage catheters  Detailed discussion with general surgery entertained on admission by admitting physician and it was felt that there was a drainable area of abscess- He is not a candidate for chemotherapy  Other comorbid = extensive active ulcerative colitis, microcytic anemia, PAD status post pop-peroneal bypass thrombectomy anterior tibialis 04/3152 complicated by ulcerations  On admission found to have leukocytosis WBC 23-->15 mild anemia hemoglobin 10 range down from 11-12 BUN/creatinine 10/0.9 LFTs normal  A & Plan Intra-abdominal abscess not a good candidate for surgery : IR does not feel they can access this area well for drain Continue cefepime, added Flagyl 1/17 in addition-await finalization then will curbside ID re: duration Will start regular diet and see how he does Metastatic stage IIC, T4N0 colon cancer, transverse colectomy 2015, distal pancreatectomy with excision of mesenteric metastases 11/2015 and numerous drains in the past Antibiotic management and further discussion as an outpatient with reimaging probably in the outpatient setting History of peripheral vascular disease status post interventions in 2015 Continue Xarelto 20 daily History of ulcerative colitis Not  currently on meds for this-can continue sulfasalazine 1500 daily as an outpatient Would continue low-dose prednisone 5 mg daily--might be risk factor for recurrences of infection and will ask patient if we can downtitrate  HTN Continue losartan 50 mg daily, Aldactone 25 daily Microcytic anemia Stable at this time Moderate malnutrition BMI 22 In the setting of poor diet we will monitor   DVT resumed on Xarelto code Status: Full code communication: d/w wife breifly disposition Plan: await culture finalization and then will need 24 hours on orals to ensure no further worsening-will be here for several more days   Verlon Au, MD  [facility] Via [employer] app OR [email or web address] 7PM-7AM contact night coverage as above 01/22/2018, 2:01 PM  LOS: 2 days   Consultants:  Oncology  Surgery  Radiology  Procedures:  None  Antimicrobials:  Cefepime since 1/16  Flagyl since 1/17  Interval history/Subjective:  Overall well and seems improved tol diet No fever, abd pain,cp, njo v, diarr  Objective:  Vitals:  Vitals:   01/22/18 0503 01/22/18 1353  BP: 126/61 120/68  Pulse: 82 92  Resp: 16 16  Temp: 99.2 F (37.3 C) 98.2 F (36.8 C)  SpO2: 96% 96%    Exam:  No changes  EOMI NCAT no distress no pallor no icterus Abdomen soft no rebound no guarding Chest is clinically clear without added sound S1-S2 no murmur rub or gallop on monitor Normal sinus rhythm Stable telemetry was discontinued    I have personally reviewed the following:  DATA   Labs:  Sodium 133->136  CO2 20  WBC down from admit 23-->15.4 hemoglobin 10 platelet 246  Scheduled Meds: . lactose free nutrition  237 mL Oral TID WC  . losartan  50 mg Oral Daily  . megestrol  160 mg Oral Daily  . multivitamin with minerals  1  tablet Oral Daily  . predniSONE  5 mg Oral QAC breakfast  . rivaroxaban  20 mg Oral Q breakfast  . spironolactone  25 mg Oral Daily  . sulfaSALAzine  1,500 mg Oral Daily    Continuous Infusions: . sodium chloride 100 mL/hr at 01/22/18 0300  . ceFEPime (MAXIPIME) IV 2 g (01/22/18 0957)  . 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

## 2018-01-23 ENCOUNTER — Inpatient Hospital Stay: Payer: Self-pay

## 2018-01-23 LAB — COMPREHENSIVE METABOLIC PANEL
ALK PHOS: 53 U/L (ref 38–126)
ALT: 8 U/L (ref 0–44)
AST: 13 U/L — ABNORMAL LOW (ref 15–41)
Albumin: 2.4 g/dL — ABNORMAL LOW (ref 3.5–5.0)
Anion gap: 10 (ref 5–15)
BUN: 9 mg/dL (ref 8–23)
CO2: 17 mmol/L — ABNORMAL LOW (ref 22–32)
Calcium: 7.9 mg/dL — ABNORMAL LOW (ref 8.9–10.3)
Chloride: 110 mmol/L (ref 98–111)
Creatinine, Ser: 0.75 mg/dL (ref 0.61–1.24)
GFR calc Af Amer: >60 mL/min (ref >60)
GFR calc non Af Amer: >60 mL/min (ref >60)
Glucose, Bld: 124 mg/dL — ABNORMAL HIGH (ref 70–99)
POTASSIUM: 3.4 mmol/L — AB (ref 3.5–5.1)
SODIUM: 137 mmol/L (ref 135–145)
Total Bilirubin: 0.4 mg/dL (ref 0.3–1.2)
Total Protein: 6.2 g/dL — ABNORMAL LOW (ref 6.5–8.1)

## 2018-01-23 LAB — CBC WITH DIFFERENTIAL/PLATELET
Abs Immature Granulocytes: 0.18 10*3/uL — ABNORMAL HIGH (ref 0.00–0.07)
Basophils Absolute: 0.1 10*3/uL (ref 0.0–0.1)
Basophils Relative: 1 %
Eosinophils Absolute: 0.6 10*3/uL — ABNORMAL HIGH (ref 0.0–0.5)
Eosinophils Relative: 4 %
HCT: 33.4 % — ABNORMAL LOW (ref 39.0–52.0)
Hemoglobin: 10.1 g/dL — ABNORMAL LOW (ref 13.0–17.0)
Immature Granulocytes: 1 %
Lymphocytes Relative: 11 %
Lymphs Abs: 1.8 10*3/uL (ref 0.7–4.0)
MCH: 25.7 pg — ABNORMAL LOW (ref 26.0–34.0)
MCHC: 30.2 g/dL (ref 30.0–36.0)
MCV: 85 fL (ref 80.0–100.0)
Monocytes Absolute: 1.6 10*3/uL — ABNORMAL HIGH (ref 0.1–1.0)
Monocytes Relative: 10 %
Neutro Abs: 11.6 10*3/uL — ABNORMAL HIGH (ref 1.7–7.7)
Neutrophils Relative %: 73 %
Platelets: 326 10*3/uL (ref 150–400)
RBC: 3.93 MIL/uL — ABNORMAL LOW (ref 4.22–5.81)
RDW: 20.1 % — ABNORMAL HIGH (ref 11.5–15.5)
WBC: 15.9 10*3/uL — AB (ref 4.0–10.5)
nRBC: 0 % (ref 0.0–0.2)

## 2018-01-23 MED ORDER — SODIUM CHLORIDE 0.9 % IV SOLN: INTRAVENOUS | Status: DC | PRN

## 2018-01-23 MED ORDER — SODIUM CHLORIDE 0.9 % IV SOLN
2.0000 g | INTRAVENOUS | Status: DC
  Administered 2018-01-23 – 2018-01-24 (×2): 2 g via INTRAVENOUS
  Filled 2018-01-23 (×2): qty 2

## 2018-01-23 MED ORDER — SODIUM CHLORIDE 0.9% FLUSH: 10.0000 mL | INTRAVENOUS | Status: DC | PRN

## 2018-01-23 MED ORDER — SODIUM CHLORIDE 0.9% FLUSH: 10.0000 mL | Freq: Two times a day (BID) | INTRAVENOUS | Status: DC

## 2018-01-23 MED ORDER — PREDNISONE 5 MG PO TABS
2.5000 mg | ORAL_TABLET | Freq: Every day | ORAL | Status: DC
  Administered 2018-01-23 – 2018-01-24 (×2): 2.5 mg via ORAL
  Filled 2018-01-23 (×2): qty 1

## 2018-01-23 NOTE — Progress Notes (Addendum)
Pharmacy Antibiotic Note  Cody Suarez is a 73 y.o. male with a recent h/o intraabdominal abscess with drain admitted on 01/20/2018 with fever and chills. CT from 01/18/18 with necrotic mass and area of fluid collection suspicious for metastatic tumor vs infectious. Abdominal abscess culture from 12/28 w/ E coli.  Pharmacy has been consulted for cefepime dosing for possible infectious etiology. Flagyl added per MD on 01/21/18. Intra-abdominal fluid collections too small to drain per IR.   Plan: D3 abx -Cefepime 2g IV q8h, Flagyl 528m IV q8h dosing remain appropriate for indication, renal function.  -F/u MD plans for transition to PO antibiotics, duration of therapy.    Height: 5' 10"  (177.8 cm) Weight: 156 lb 11.2 oz (71.1 kg) IBW/kg (Calculated) : 73  Temp (24hrs), Avg:98.5 F (36.9 C), Min:98.2 F (36.8 C), Max:99 F (37.2 C)  Recent Labs  Lab 01/18/18 1027 01/20/18 1617 01/21/18 0408 01/22/18 0445 01/23/18 0411  WBC 23.7*  --  15.8* 15.4* 15.9*  CREATININE 1.07 1.02 0.92 0.93 0.75    Estimated Creatinine Clearance: 82.7 mL/min (by C-G formula based on SCr of 0.75 mg/dL).    Allergies  Allergen Reactions  . Amlodipine Swelling    Right LE edema  . Gabapentin Rash    Short-term memory decrease    Antimicrobials this admission: 1/16 Cefepime>> 1/17 Flagyl >>  Dose adjustments this admission: --  Microbiology results: 1/16BCx: NGTD 1/16UCx: < 10K insignificant growth  Thank you for allowing pharmacy to be a part of this patient's care.   JLindell Spar PharmD, BCPS Pager: [phone]/19/2020 10:50 AM

## 2018-01-23 NOTE — Progress Notes (Signed)
   Subjective/Chief Complaint: Feels fine, wants to go home   Objective: Vital signs in last 24 hours: Temp:  [98.2 F (36.8 C)-99 F (37.2 C)] 98.2 F (36.8 C) (01/19 0426) Pulse Rate:  [83-92] 83 (01/19 0426) Resp:  [16-18] 18 (01/19 0426) BP: (116-124)/(60-68) 124/65 (01/19 0426) SpO2:  [95 %-96 %] 96 % (01/19 0426) Last BM Date: 01/22/18  Intake/Output from previous day: 01/18 0701 - 01/19 0700 In: 900 [I.V.:900] Out: -  Intake/Output this shift: No intake/output data recorded.  General appearance: alert and cooperative GI: soft, non-tender; bowel sounds normal; no masses,  no organomegaly reducible incisional hernia   Lab Results:  Recent Labs    01/22/18 0445 01/23/18 0411  WBC 15.4* 15.9*  HGB 10.0* 10.1*  HCT 32.7* 33.4*  PLT 298 326   BMET Recent Labs    01/22/18 0445 01/23/18 0411  NA 136 137  K 3.5 3.4*  CL 106 110  CO2 20* 17*  GLUCOSE 119* 124*  BUN 8 9  CREATININE 0.93 0.75  CALCIUM 7.8* 7.9*   PT/INR Recent Labs    01/21/18 0408  LABPROT 18.5*  INR 1.56   ABG No results for input(s): PHART, HCO3 in the last 72 hours.  Invalid input(s): PCO2, PO2  Studies/Results: No results found.  Anti-infectives: Anti-infectives (From admission, onward)   Start     Dose/Rate Route Frequency Ordered Stop   01/21/18 1200  metroNIDAZOLE (FLAGYL) IVPB 500 mg     500 mg 100 mL/hr over 60 Minutes Intravenous Every 8 hours 01/21/18 1022     01/20/18 1830  ceFEPIme (MAXIPIME) 2 g in sodium chloride 0.9 % 100 mL IVPB     2 g 200 mL/hr over 30 Minutes Intravenous Every 8 hours 01/20/18 1742        Assessment/Plan: Intra-abdominal abscess Hx ulcerative colitis -prednisone/sulfasalazine Stage IIc adenocarcinoma: - Chemotherapy             Hx partial colectomy 03/2013; abdominal mesenteric mass/tail of pancreas resection 11/05/2015, Dr. [name] Hx prior cholecystectomy Ischemic right lower extremity with femoropopliteal bypass graft and  femoral-tibial bypass graft Hypertension Hx of DVT -on Xarelto  Intraabdominal fluid collections too small to drain. Current plan is continue abx therapy, consider ID consult. Discharge pending final antibiotic plan. Will continue to follow peripherally.    LOS: 3 days    Clovis Riley 01/23/2018

## 2018-01-23 NOTE — Progress Notes (Signed)
[facility] PROGRESS NOTE  DEQUARIUS JEFFRIES YME:[id number] DOB: Apr 22, 1945 DOA: 01/20/2018 PCP: Midge Minium, MD   Narrative: 73 cm metastatic colon cancer resection 2015+ distal pancreatectomy 2017 2/2 metastatic disease-also intra-abdominal drain for abscess removed 01/04/18 with resolution of abscess cavity-anorexia, chills malaise, lethargy and was admitted as a direct admit from oncology office 1/17-grew E. coli resistant to sulfa penicillin cultured from the cavity 12/27/2017 Last imaging study showed no change in soft tissue mass to the pancreas but involves gastric fundus extends into splenic hilum but no drainable organized fluid collections and remove the previously seen percutaneous drainage catheters  Detailed discussion with general surgery entertained on admission by admitting physician and it was felt that there was a drainable area of abscess- He is not a candidate for chemotherapy  Other comorbid = extensive active ulcerative colitis, microcytic anemia, PAD status post pop-peroneal bypass thrombectomy anterior tibialis 06/8086 complicated by ulcerations  On admission found to have leukocytosis WBC 23-->15 mild anemia hemoglobin 10 range down from 11-12 BUN/creatinine 10/0.9 LFTs normal  A & Plan Intra-abdominal abscess not a good candidate for surgery : IR does not feel they can access this area well for drain Discussed with Dr. Drucilla Schmidt ID-best option IV ceftriaxone 2 g + Flagyl 3 times daily total of month ending 02/20/2018 with intermittent CT scans to determine intra-abdominal progress Metastatic stage IIC, T4N0 colon cancer, transverse colectomy 2015, distal pancreatectomy with excision of mesenteric metastases 11/2015 and numerous drains in the past-not a candidate for further chemotherapy per oncology notes Defer management to oncologist as an outpatient History of peripheral vascular disease status post interventions in 2015 Continue Xarelto 20 daily History of  ulcerative colitis continue sulfasalazine 1500 daily  Cut back prednisone 5-->2.5 to promote further healing in abdomen HTN Continue losartan 50 mg daily, Aldactone 25 daily Microcytic anemia Stable at this time Moderate malnutrition BMI 22 In the setting of poor diet we will monitor   DVT resumed on Xarelto code Status: Full code communication: d/w wife breifly disposition Plan:  Patient will need PICC line placed and have diarrhea and other issues addressed as an outpatient if however persisting diarrhea may need to work-up Expect can discharge with home health tomorrow if lines can be placed   Rynell Ciotti, MD  [facility] Via [employer] app OR [email or web address] 7PM-7AM contact night coverage as above 01/23/2018, 11:47 AM  LOS: 3 days   Consultants:  Oncology  Surgery  Radiology  Procedures:  None  Antimicrobials:  Cefepime since 1/16  Flagyl since 1/17  Interval history/Subjective:  Overall fair no distress eating drinking but is having some loose stools was nauseated earlier this morning and yesterday evening No vomit however No chest pain  Objective:  Vitals:  Vitals:   01/22/18 2041 01/23/18 0426  BP: 116/60 124/65  Pulse: 89 83  Resp: 18 18  Temp: 99 F (37.2 C) 98.2 F (36.8 C)  SpO2: 95% 96%    Exam:  Alert oriented no distress icterus pallor negative No submandibular lymphadenopathy Chest clear S1-S2 no murmur Abdomen soft no rebound No lower extremity edema   I have personally reviewed the following:  DATA   Labs:  Bicarb down from 20-17  Chem-7 otherwise normal slightly low albumin  WBC maintaining in the 15 range.male  No fever curve noted although low-grade temps 99  Scheduled Meds: . lactose free nutrition  237 mL Oral TID WC  . losartan  50 mg Oral Daily  . megestrol  160 mg Oral Daily  .  multivitamin with minerals  1 tablet Oral Daily  . predniSONE  2.5 mg Oral QAC breakfast  . rivaroxaban  20 mg Oral Q breakfast  .  spironolactone  25 mg Oral Daily  . sulfaSALAzine  1,500 mg Oral Daily   Continuous Infusions: . sodium chloride    . cefTRIAXone (ROCEPHIN)  IV    . 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

## 2018-01-23 NOTE — Plan of Care (Signed)
Pt tearful at times during the day, stating he has been through "a lot in the last couple of month since all this started up".

## 2018-01-23 NOTE — Progress Notes (Signed)
Peripherally Inserted Central Catheter/Midline Placement  The IV Nurse has discussed with the patient and/or persons authorized to consent for the patient, the purpose of this procedure and the potential benefits and risks involved with this procedure.  The benefits include less needle sticks, lab draws from the catheter, and the patient may be discharged home with the catheter. Risks include, but not limited to, infection, bleeding, blood clot (thrombus formation), and puncture of an artery; nerve damage and irregular heartbeat and possibility to perform a PICC exchange if needed/ordered by physician.  Alternatives to this procedure were also discussed.  Bard Power PICC patient education guide, fact sheet on infection prevention and patient information card has been provided to patient /or left at bedside.  Wife and dtr at bedside also agree with  PICC placement.  PICC/Midline Placement Documentation  PICC Single Lumen 01/23/18 PICC Right Brachial 39 cm 1 cm (Active)  Indication for Insertion or Continuance of Line Prolonged intravenous therapies 01/23/2018  4:00 PM  Exposed Catheter (cm) 0 cm 01/23/2018  4:00 PM  Site Assessment Clean;Dry;Intact 01/23/2018  4:00 PM  Line Status Flushed;Blood return noted;Saline locked 01/23/2018  4:00 PM  Dressing Type Transparent 01/23/2018  4:00 PM  Dressing Status Clean;Dry;Intact;Antimicrobial disc in place 01/23/2018  4:00 PM  Line Care Connections checked and tightened 01/23/2018  4:00 PM  Dressing Intervention New dressing 01/23/2018  4:00 PM  Dressing Change Due 01/30/18 01/23/2018  4:00 PM       Rolena Infante 01/23/2018, 4:47 PM

## 2018-01-24 LAB — CBC WITH DIFFERENTIAL/PLATELET
Abs Immature Granulocytes: 0.16 10*3/uL — ABNORMAL HIGH (ref 0.00–0.07)
Basophils Absolute: 0.2 10*3/uL — ABNORMAL HIGH (ref 0.0–0.1)
Basophils Relative: 1 %
Eosinophils Absolute: 0.7 10*3/uL — ABNORMAL HIGH (ref 0.0–0.5)
Eosinophils Relative: 4 %
HEMATOCRIT: 33.9 % — AB (ref 39.0–52.0)
Hemoglobin: 10.6 g/dL — ABNORMAL LOW (ref 13.0–17.0)
IMMATURE GRANULOCYTES: 1 %
LYMPHS ABS: 2 10*3/uL (ref 0.7–4.0)
LYMPHS PCT: 13 %
MCH: 25.3 pg — ABNORMAL LOW (ref 26.0–34.0)
MCHC: 31.3 g/dL (ref 30.0–36.0)
MCV: 80.9 fL (ref 80.0–100.0)
Monocytes Absolute: 1.5 10*3/uL — ABNORMAL HIGH (ref 0.1–1.0)
Monocytes Relative: 9 %
Neutro Abs: 11.1 10*3/uL — ABNORMAL HIGH (ref 1.7–7.7)
Neutrophils Relative %: 72 %
Platelets: 451 10*3/uL — ABNORMAL HIGH (ref 150–400)
RBC: 4.19 MIL/uL — ABNORMAL LOW (ref 4.22–5.81)
RDW: 19.9 % — ABNORMAL HIGH (ref 11.5–15.5)
WBC: 15.6 10*3/uL — ABNORMAL HIGH (ref 4.0–10.5)
nRBC: 0 % (ref 0.0–0.2)

## 2018-01-24 MED ORDER — MEGESTROL ACETATE 40 MG PO TABS: 160.0000 mg | ORAL_TABLET | Freq: Every day | ORAL | 11 refills | Status: DC

## 2018-01-24 MED ORDER — METRONIDAZOLE IN NACL 5-0.79 MG/ML-% IV SOLN: 500.0000 mg | Freq: Three times a day (TID) | INTRAVENOUS | 0 refills | Status: DC

## 2018-01-24 MED ORDER — METRONIDAZOLE 500 MG PO TABS: 500.0000 mg | ORAL_TABLET | Freq: Three times a day (TID) | ORAL | 0 refills | Status: AC

## 2018-01-24 MED ORDER — POLYETHYLENE GLYCOL 3350 17 G PO PACK: 17.0000 g | PACK | Freq: Every day | ORAL | 0 refills | Status: DC | PRN

## 2018-01-24 MED ORDER — PREDNISONE 2.5 MG PO TABS: 2.5000 mg | ORAL_TABLET | Freq: Every day | ORAL | Status: DC

## 2018-01-24 MED ORDER — HEPARIN SOD (PORK) LOCK FLUSH 100 UNIT/ML IV SOLN
250.0000 [IU] | INTRAVENOUS | Status: AC | PRN
  Administered 2018-01-24: 250 [IU]

## 2018-01-24 MED ORDER — CEFTRIAXONE IV (FOR PTA / DISCHARGE USE ONLY): 2.0000 g | INTRAVENOUS | 0 refills | Status: DC

## 2018-01-24 NOTE — Progress Notes (Addendum)
Patient ID: Cody Suarez, male   DOB: [birth year], 73 y.o.   MRN: [id number]       Subjective: Pleasant this morning. Denies pain, N/V. Passing flatus. PICC line placed overnight. Says hospitalist said he will be going home today. No fevers overnight.  Objective: Vital signs in last 24 hours: Temp:  [98 F (36.7 C)-98.7 F (37.1 C)] 98.4 F (36.9 C) (01/20 0458) Pulse Rate:  [78-92] 84 (01/20 0458) Resp:  [16-18] 18 (01/20 0458) BP: (127)/(67) 127/67 (01/19 1531) SpO2:  [96 %-97 %] 96 % (01/20 0458) Last BM Date: 01/22/18  Intake/Output from previous day: 01/19 0701 - 01/20 0700 In: 615.2 [IV Piggyback:615.2] Out: -  Intake/Output this shift: No intake/output data recorded.  PE: Gen: WD, WN, NAD. Pleasant this morning Heart: RRR Lungs: CTA b/l Abd: Soft, ND, NT. +BS   Lab Results:  Recent Labs    01/23/18 0411 01/24/18 0430  WBC 15.9* 15.6*  HGB 10.1* 10.6*  HCT 33.4* 33.9*  PLT 326 451*   BMET Recent Labs    01/22/18 0445 01/23/18 0411  NA 136 137  K 3.5 3.4*  CL 106 110  CO2 20* 17*  GLUCOSE 119* 124*  BUN 8 9  CREATININE 0.93 0.75  CALCIUM 7.8* 7.9*   PT/INR No results for input(s): LABPROT, INR in the last 72 hours. CMP     Component Value Date/Time   NA 137 01/23/2018 0411   NA 140 11/05/2016 0855   NA 139 07/29/2016 0856   K 3.4 (L) 01/23/2018 0411   K 3.9 07/29/2016 0856   CL 110 01/23/2018 0411   CO2 17 (L) 01/23/2018 0411   CO2 25 07/29/2016 0856   GLUCOSE 124 (H) 01/23/2018 0411   GLUCOSE 129 07/29/2016 0856   BUN 9 01/23/2018 0411   BUN 15 11/05/2016 0855   BUN 16.1 07/29/2016 0856   CREATININE 0.75 01/23/2018 0411   CREATININE 1.07 01/18/2018 1027   CREATININE 1.1 07/29/2016 0856   CALCIUM 7.9 (L) 01/23/2018 0411   CALCIUM 9.5 07/29/2016 0856   PROT 6.2 (L) 01/23/2018 0411   PROT 6.7 10/22/2016 1206   PROT 7.0 07/18/2014 1302   ALBUMIN 2.4 (L) 01/23/2018 0411   ALBUMIN 3.7 10/22/2016 1206   ALBUMIN 3.7 07/18/2014 1302     AST 13 (L) 01/23/2018 0411   AST 29 01/18/2018 1027   AST 18 07/18/2014 1302   ALT 8 01/23/2018 0411   ALT 23 01/18/2018 1027   ALT 22 07/18/2014 1302   ALKPHOS 53 01/23/2018 0411   ALKPHOS 45 07/18/2014 1302   BILITOT 0.4 01/23/2018 0411   BILITOT 0.4 01/18/2018 1027   BILITOT 0.47 07/18/2014 1302   GFRNONAA >60 01/23/2018 0411   GFRNONAA >60 01/18/2018 1027   GFRAA >60 01/23/2018 0411   GFRAA >60 01/18/2018 1027   Lipase     Component Value Date/Time   LIPASE 27 01/18/2018 1027       Studies/Results: [place] Ekg Site Rite  Result Date: 01/23/2018 If Site Rite image not attached, placement could not be confirmed due to current cardiac rhythm.   Anti-infectives: Anti-infectives (From admission, onward)   Start     Dose/Rate Route Frequency Ordered Stop   01/23/18 1800  cefTRIAXone (ROCEPHIN) 2 g in sodium chloride 0.9 % 100 mL IVPB     2 g 200 mL/hr over 30 Minutes Intravenous Every 24 hours 01/23/18 1144     01/21/18 1200  metroNIDAZOLE (FLAGYL) IVPB 500 mg     500  mg 100 mL/hr over 60 Minutes Intravenous Every 8 hours 01/21/18 1022     01/20/18 1830  ceFEPIme (MAXIPIME) 2 g in sodium chloride 0.9 % 100 mL IVPB  Status:  Discontinued     2 g 200 mL/hr over 30 Minutes Intravenous Every 8 hours 01/20/18 1742 01/23/18 1144       Assessment/Plan Intra-abdominal abscess    - Hx of intra-abdominal abscess on 12/20; s/p IR drain on 12/23 that grew ampicillin resistant E. Coli    - Repeat CT scan on 1/14 shows continued intra-abdominal abscess     -Started on IV abx    -WBC downtrending since admission     Hx ulcerative colitis-prednisone/sulfasalazine Stage IIc adenocarcinoma:-Awaiting start of chemotherapy (was planned for 01/05/18) - followed by Dr. Benay Spice  Hx partial colectomy 03/2013; abdominal mesenteric mass/tail of pancreas resection 11/05/2015,Dr. [name] Hx prior cholecystectomy Ischemic right lower extremity with femoropopliteal bypass graft and  femoral-tibial bypass graft Hypertension Hx of DVT -on Xarelto  Plan: Intraabdominal fluid collections that are too small to drain. ID has recommended per hospitalist note, IV "ceftriaxone 2 g + Flagyl 3 times daily total of month ending 02/20/2018 with intermittent CT scans to determine intra-abdominal progress". PICC line placed yesterday. Will have patient follow up with Dr. Lucia Gaskins in ~2 weeks in the office. Patient is stable for discharge from a surgery standpoint.   FEN - Regular  VTE - Xarelto (theraputic, hx of DVT) ID - Cefepime 1/16 - 1/19; Flagyl 1/17 >>; Rocephin 1/19 >>   LOS: 4 days    Jillyn Ledger , [facility] 01/24/2018, 9:42 AM Pager: [phone]

## 2018-01-24 NOTE — Progress Notes (Signed)
Pt alert, oriented, tolerating diet.  D/C instructions given, as well as prescriptions.  All questions answered.  Pt d/cd home with spouse.

## 2018-01-24 NOTE — Care Management Important Message (Signed)
Important Message  Patient Details  Name: SHADOW SCHEDLER MRN: [id number] Date of Birth: 06/28/1945   Medicare Important Message Given:  Yes    Kerin Salen 01/24/2018, 12:36 [place] Message  Patient Details  Name: SANAD FEARNOW MRN: [id number] Date of Birth: Dec 31, 1945   Medicare Important Message Given:  Yes    Kerin Salen 01/24/2018, 12:36 PM

## 2018-01-24 NOTE — Discharge Summary (Addendum)
Physician Discharge Summary  Cody Suarez YQM:[id number] DOB: 11/12/1945 DOA: 01/20/2018  PCP: Midge Minium, MD  Admit date: 01/20/2018 Discharge date: 01/24/2018  Time spent: 45 minutes  Recommendations for Outpatient Follow-up:  1. We will continue antibiotics IV via PICC line and get follow-up coordinated with either PCP or oncologist and surgeon should supervise the same 2. Have prescribed Megace for the patient but may be prohibitive might consider Marinol/Remeron in the outpatient setting? 3. Requires CBC Chem-12 in the outpatient setting 4. 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 coli resistant to sulfa penicillin cultured from the cavity 12/27/2017 Last imaging study showed no change in soft tissue mass to the pancreas but involves gastric fundus extends into splenic hilum but no drainable organized fluid collections and remove the previously seen percutaneous drainage catheters  Detailed discussion with general surgery entertained on admission by admitting physician and it was felt that there was a drainable area of abscess- He is not a candidate for chemotherapy  Other comorbid =  extensive active ulcerative colitis, microcytic anemia, PAD status post pop-peroneal bypass thrombectomy anterior tibialis 08/8914 complicated by ulcerations  On admission found to have leukocytosis WBC 23-->15 mild anemia hemoglobin 10 range down from 11-12 BUN/creatinine 10/0.9 LFTs normal   Hospital Course:  Intra-abdominal abscess not a good candidate for surgery : IR does not feel they can access this area well for drain Discussed with Dr. Drucilla Schmidt ID-best option IV ceftriaxone 2 g + Flagyl 3 times daily total of month ending 02/20/2018 with intermittent CT scans to determine intra-abdominal progress Oncologist is indicated he will follow closely as well general surgery Metastatic stage IIC, T4N0 colon cancer, transverse colectomy 2015, distal pancreatectomy with excision of mesenteric metastases 11/2015 and numerous drains in the past-not a candidate for further chemotherapy per oncology notes Defer management to oncologist as an outpatient History of peripheral vascular disease status post interventions in 2015 Continue Xarelto 20 daily History of ulcerative colitis continue sulfasalazine 1500 daily  Cut back prednisone 5-->2.5 to promote further healing in abdomen HTN Continue losartan 50 mg daily, Aldactone 25 daily Microcytic anemia Stable at this time Moderate malnutrition BMI 22 In the setting of poor diet we will monitor-consideration of changing Megace to other agent in the outpatient setting by PCP  Procedures: Ct abd pelvis IMPRESSION: 1. No change in soft tissue mass at the tail the pancreas which involves the gastric fundus and extends into the splenic hilum. No drainable organized fluid collections. 2. Removal of previous seen percutaneous drainage catheters   Consultations:  Interventional radiology  General surgery  Discharge Exam: Vitals:   01/23/18 2140 01/24/18 0458  BP:    Pulse: 78 84  Resp: 18 18  Temp: 98.7 F (37.1 C) 98.4 F (36.9 C)   SpO2: 96%  96%    General: Awake alert pleasant no distress eating and drinking No fever no chills today Ambulatory Somewhat emotional yesterday Cardiovascular: S1-S2 no murmur rub or gallop no rales Respiratory: Clinically clear no added sound Abdomen soft no rebound no guarding  Discharge Instructions   Discharge Instructions    Diet - low sodium heart healthy   Complete by:  As directed    Discharge instructions   Complete by:  As directed    Please continue antibiotics as per protocol-we will get Home health agency to come out and explain the protocol and how to administer the medications that you will need probably 4 months-my believe is Dr. Benay Spice will set you up for close outpatient follow-up and scan your belly to determine further steps in management of this You will need some labs I have printed at your request a prescription for Megace and given you refills as sometimes it is cheaper if you get your supply with refills   Home infusion instructions [facility] May follow [place] Dosing Protocol; May administer Cathflo as needed to maintain patency of vascular access device.; Flushing of vascular access device: per [facility] Protocol: 0.9% NaCl pre/post medica...   Complete by:  As directed    Instructions:  May follow [place] Dosing Protocol   Instructions:  May administer Cathflo as needed to maintain patency of vascular access device.   Instructions:  Flushing of vascular access device: per [facility] Protocol: 0.9% NaCl pre/post medication administration and prn patency; Heparin 100 u/ml, 54m for implanted ports and Heparin 10u/ml, 520mfor all other central venous catheters.   Instructions:  May follow [facility] Anaphylaxis Protocol for First Dose Administration in the home: 0.9% NaCl at 25-50 ml/hr to maintain IV access for protocol meds. Epinephrine 0.3 ml IV/IM PRN and Benadryl 25-50 IV/IM PRN s/s of anaphylaxis.   Instructions:  AdNarcissanfusion Coordinator  (RN) to assist per patient IV care needs in the home PRN.   Increase activity slowly   Complete by:  As directed      Allergies as of 01/24/2018      Reactions   Amlodipine Swelling   Right LE edema   Gabapentin Rash   Short-term memory decrease      Medication List    STOP taking these medications   diphenhydramine-acetaminophen 25-500 MG Tabs tablet Commonly known as:  TYLENOL PM     TAKE these medications   acetaminophen 500 MG tablet Commonly known as:  TYLENOL Take 1,000 mg by mouth daily as needed for mild pain or headache.   BLUE-EMU SUPER STRENGTH EX Apply 1 application topically daily as needed (knee pain).   cefTRIAXone  IVPB Commonly known as:  ROCEPHIN Inject 2 g into the vein daily. Indication:  Intraabdominal infection Last Day of Therapy:  02/19/18 Labs - Once weekly:  CBC/D and BMP, Labs - Every other week:  ESR and CRP   COQ10 PO Take 1 capsule by mouth daily.   losartan 100 MG tablet Commonly known as:  COZAAR Take 0.5 tablets (50 mg total) by mouth daily.   megestrol 40 MG tablet Commonly known as:  MEGACE Take 4 tablets (160 mg total) by mouth daily.   metroNIDAZOLE 500 MG tablet Commonly known as:  FLAGYL Take 1 tablet (500 mg total) by mouth 3 (three) times daily for 79 doses.   multivitamin with minerals Tabs tablet Take 1 tablet by mouth daily.   ondansetron 4 MG tablet Commonly known as:  ZOFRAN Take 1 tablet (4 mg total) by mouth every 8 (eight) hours as needed for nausea or vomiting.   polyethylene glycol packet Commonly known as:  MIRALAX / GLYCOLAX Take 17 g by mouth daily as needed for mild constipation.   predniSONE 2.5 MG tablet Commonly known as:  DELTASONE Take 1 tablet (2.5 mg total) by mouth daily before breakfast. Start taking on:  January 25, 2018 What changed:    medication strength  how much to take  when to take this   rivaroxaban 20 MG Tabs tablet Commonly known as:  XARELTO Take 1 tablet (20 mg total)  by mouth daily with breakfast. Lot [id number]  06/2019 x2   spironolactone 25 MG tablet Commonly known as:  ALDACTONE TAKE 1 TABLET BY MOUTH ONCE DAILY   sulfaSALAzine 500 MG EC tablet Commonly known as:  AZULFIDINE Take 1,500 mg by mouth daily.   THERAWORX RELIEF EX Apply 1 application topically daily as needed (calf pain).            Home Infusion Instuctions  (From admission, onward)         Start     Ordered   01/24/18 0000  Home infusion instructions [facility] May follow [place] Dosing Protocol; May administer Cathflo as needed to maintain patency of vascular access device.; Flushing of vascular access device: per [facility] Protocol: 0.9% NaCl pre/post medica...    Question Answer Comment  Instructions May follow [place] Dosing Protocol   Instructions May administer Cathflo as needed to maintain patency of vascular access device.   Instructions Flushing of vascular access device: per [facility] Protocol: 0.9% NaCl pre/post medication administration and prn patency; Heparin 100 u/ml, 55m for implanted ports and Heparin 10u/ml, 53mfor all other central venous catheters.   Instructions May follow [facility] Anaphylaxis Protocol for First Dose Administration in the home: 0.9% NaCl at 25-50 ml/hr to maintain IV access for protocol meds. Epinephrine 0.3 ml IV/IM PRN and Benadryl 25-50 IV/IM PRN s/s of anaphylaxis.   Instructions [facility] Infusion Coordinator (RN) to assist per patient IV care needs in the home PRN.      01/24/18 1027         Allergies  Allergen Reactions  . Amlodipine Swelling    Right LE edema  . Gabapentin Rash    Short-term memory decrease   Follow-up Information    [place]   Specialty:  General Surgery Why:  Your follow up appointment is scheduled for 02/11/18 @  830 with Dr. NeLucia GaskinsPlease arrive 1060mtes before your appointment. Please bring a copy of your photo ID and insurance card.  Contact  information: [address] [place] [phone]         The results of significant diagnostics from this hospitalization (including imaging, microbiology, ancillary and laboratory) are listed below for reference.    Significant Diagnostic Studies: Ct Abdomen Pelvis W Contrast  Result Date: 01/18/2018 CLINICAL DATA:  Metastatic colorectal carcinoma. Pancreatic prot dur to hx of mets to pancreas Pt sts evaluating infection Recent drain placed and removed in December not in Pancreas per pt EXAM: CT ABDOMEN AND PELVIS WITH CONTRAST TECHNIQUE: Multidetector CT imaging of the abdomen and pelvis was performed using the standard protocol following bolus administration of intravenous contrast. CONTRAST:  100m71mNIPAQUE IOHEXOL 300 MG/ML  SOLN COMPARISON:  01/04/2018: Findings: Lower chest: Lung bases are clear. Hepatobiliary: No focal hepatic lesion. Postcholecystectomy.  No biliary dilatation. Pancreas: Normal head body of pancreas. In the tail the pancreas, there is confluent soft tissue mass with central calcifications which joins the gastric body and extends the splenic hilum. Centrally within the soft tissue bridging the stomach and spleen is a low-attenuation 2.7 by 0.9 cm collection which is similar to CT of 01/04/2018 and much improved from CT of 12/24/2017. Interval removal of the percutaneous drainage catheters. Lateral to the dominant mass there is a 2.3 cm collection which compares to 1.9 on prior. (Image 44/2) Spleen: Normal spleen Adrenals/urinary tract: Adrenal glands and kidneys are normal. The ureters and bladder normal. Stomach/Bowel: Stomach is unchanged with inflammatory mass along the proximal gastric body. The duodenum and bowel is unremarkable. Vascular/Lymphatic: Abdominal aorta is normal caliber with atherosclerotic calcification. There is no retroperitoneal or periportal lymphadenopathy. No pelvic lymphadenopathy. Reproductive: Prostate normal Other: No  free fluid. Musculoskeletal: No aggressive osseous lesion. IMPRESSION: 1. No change in soft tissue mass at the tail the pancreas which involves the gastric fundus and extends into the splenic hilum. No drainable organized fluid collections. 2. Removal of previous seen percutaneous drainage catheters Electronically Signed   By: Suzy Bouchard M.D.   On: 01/18/2018 15:02   Ct Abdomen Pelvis W Contrast  Result Date: 01/04/2018 CLINICAL DATA:  Intra-abdominal abscess. EXAM: CT ABDOMEN AND PELVIS WITH CONTRAST TECHNIQUE: Multidetector CT imaging of the abdomen and pelvis was performed using the standard protocol following bolus administration of intravenous contrast. CONTRAST:  161m OMNIPAQUE IOHEXOL 300 MG/ML  SOLN COMPARISON:  CT of the abdomen and pelvis 12/27/2017 FINDINGS: Lower chest: Left pleural effusion is improved. Mild dependent atelectasis is present at both bases. The heart size is. Hepatobiliary: There is diffuse fatty infiltration of the liver. Cholecystectomy is noted. Common bile duct is within normal limits. Pancreas: Pancreas is normal. Spleen: The spleen is heterogeneous without a dominant lesion. Previously noted complex fluid collection adjacent to the spleen is mostly drained. A small component just deep to the drainage catheter measures 2.1 x 2.1 x 1.4 cm. The more posterior small peripherally enhancing collection measures 1.9 x 1.2 cm. Deep to the fluid collection and inferior to the spleen are central calcifications present at the base of the previously noted mass. Residual mass lesion measures 2.7 x 4 3 cm. Adrenals/Urinary Tract: Adrenal glands are normal bilaterally. Kidneys are within normal limits. There is no stone or mass lesion. Ureters and urinary bladder are within normal limits. Stomach/Bowel: The stomach is immediately adjacent to the drained collection. Duodenum is within normal limits. The small bowel is normal. There is some stranding of the small bowel mesentery. Terminal  ileum is normal. Appendix is visualized and normal. The ascending and transverse colon are normal. Partial colectomy is present. Anastomosis is intact. Distal colon rectum are within normal limits. Vascular/Lymphatic: No significant retroperitoneal adenopathy is present. Atherosclerotic calcifications are present without aneurysm. Reproductive: Prostate is somewhat heterogeneous. No dominant lesion is evident. Other: Paraumbilical hernia contains a loop bowel without obstruction. Musculoskeletal: Degenerative changes are present at L4-5 and L5-S1. Rightward curvature is centered at L2-3. Pelvis is within normal limits. Degenerative changes are noted at the SI joints. Hips are visualized and normal. IMPRESSION: 1. Near complete drainage of previously noted complex fluid collection in the left upper quadrant. 2. Two small residual collections remain. Residual soft tissue mass at the clips concerning for residual recurrent neoplasm. 3. Peritoneal wall thickening also concerning for metastatic disease. 4. Partial colonic resection and anastomosis is intact. 5. Decreasing left pleural  effusion and basilar airspace disease, likely atelectasis. 6. Degenerative changes of the lower lumbar spine and scoliosis. Electronically Signed   By: San Morelle M.D.   On: 01/04/2018 16:08   Ir Sinus/fist Tube Chk-non Gi  Result Date: 01/04/2018 INDICATION: 73 year old with left upper quadrant abscess collection and metastatic colon cancer. Percutaneous drain placed on 12/27/2017. Patient reports minimal from the catheter. CT done earlier in the day demonstrated no significant fluid around the catheter. Tiny residual small fluid collections in the left upper quadrant. EXAM: SINUS TRACT INJECTION / FISTULOGRAM COMPARISON:  None. MEDICATIONS: None ANESTHESIA/SEDATION: None COMPLICATIONS: None immediate. FLUOROSCOPY TIME:  24 seconds, 5 mGy TECHNIQUE: Patient was placed supine on the interventional table. [place] image was  obtained. Contrast was injected through the existing catheter. PROCEDURE: Contrast was injected through the existing catheter. Injected contrast was aspirated at the end of the procedure. The catheter was cut and completely removed. Bandage placed over the old drain site. FINDINGS: Small amount of contrast fills around the catheter but there is no residual collection. There is no evidence for a bowel fistula. IMPRESSION: Abscess cavity has resolved and no evidence for a bowel fistula. Therefore, the drain was removed. Electronically Signed   By: Markus Daft M.D.   On: 01/04/2018 17:39   Ct Image Guided Fluid Drain By Catheter  Result Date: 12/27/2017 INDICATION: Left upper quadrant abscess EXAM: CT-GUIDED ABSCESS DRAIN MEDICATIONS: The patient is currently admitted to the hospital and receiving intravenous antibiotics. The antibiotics were administered within an appropriate time frame prior to the initiation of the procedure. ANESTHESIA/SEDATION: Fentanyl 25 mcg IV; Versed 1 mg IV Moderate Sedation Time:  14 minutes The patient was continuously monitored during the procedure by the interventional radiology nurse under my direct supervision. COMPLICATIONS: None immediate. PROCEDURE: Informed written consent was obtained from the patient after a thorough discussion of the procedural risks, benefits and alternatives. All questions were addressed. Maximal Sterile Barrier Technique was utilized including caps, mask, sterile gowns, sterile gloves, sterile drape, hand hygiene and skin antiseptic. A timeout was performed prior to the initiation of the procedure. Under CT guidance, an 18 gauge needle was inserted into the left upper quadrant abscess. It was removed over an Amplatz wire. Ten [place] drain was inserted over the wire. It was looped and string fixed in the abscess cavity. Reddish pus was aspirated. It was sewn to the skin. FINDINGS: Images document placement of a 10 French drain into a left upper quadrant  abscess. IMPRESSION: Successful placement of a 10 French drain into a left upper quadrant abscess. Electronically Signed   By: [name] M.D.   On: 12/27/2017 14:54   [place] Ekg Site Rite  Result Date: 01/23/2018 If Site Rite image not attached, placement could not be confirmed due to current cardiac rhythm.   Microbiology: Recent Results (from the past 240 hour(s))  Urine culture     Status: Abnormal   Collection Time: 01/20/18  3:52 PM  Result Value Ref Range Status   Specimen Description   Final    URINE, CLEAN CATCH Performed at [facility] [address] [place], [place] [address]    Special Requests   Final    NONE Performed at [facility], [place] [address] [place], [place] [address]    Culture (A)  Final    <10,000 COLONIES/mL INSIGNIFICANT GROWTH Performed at [facility] [address] [place], [place] [address]    Report Status 01/22/2018 FINAL  Final  Culture, blood (routine x 2)     Status: None (Preliminary result)   Collection Time: 01/20/18  4:17 PM  Result Value Ref Range Status   Specimen Description   Final    BLOOD LEFT ARM Performed at [facility] [address] [place], [place] [address]    Special Requests   Final    BOTTLES DRAWN AEROBIC AND ANAEROBIC Blood Culture adequate volume Performed at [facility] [address], [place], [place] [address]    Culture   Final    NO GROWTH 4 DAYS Performed at [facility], [place] [address], [place], [place] [address]    Report Status PENDING  Incomplete  Culture, blood (routine x 2)     Status: None (Preliminary result)   Collection Time: 01/20/18  4:17 PM  Result Value Ref Range Status   Specimen Description   Final    BLOOD LEFT ARM Performed at [facility] [address] [place], [place] [address]    Special Requests   Final    BOTTLES DRAWN AEROBIC AND ANAEROBIC Blood Culture adequate  volume Performed at [facility] [address], [place], [place] [address]    Culture   Final    NO GROWTH 4 DAYS Performed at [facility], [place] [address] [place], [place] [address]    Report Status PENDING  Incomplete     Labs: Basic Metabolic Panel: Recent Labs  Lab 01/18/18 1027 01/20/18 1617 01/21/18 0408 01/22/18 0445 01/23/18 0411  NA 134* 133* 133* 136 137  K 4.0 3.6 3.5 3.5 3.4*  CL 100 99 102 106 110  CO2 24 24 19* 20* 17*  GLUCOSE 159* 119* 127* 119* 124*  BUN 12 11 10 8 9   CREATININE 1.07 1.02 0.92 0.93 0.75  CALCIUM 9.4 8.4* 7.8* 7.8* 7.9*   Liver Function Tests: Recent Labs  Lab 01/18/18 1027 01/20/18 1617 01/21/18 0408 01/22/18 0445 01/23/18 0411  AST 29 18 13* 11* 13*  ALT 23 15 11 11 8   ALKPHOS 105 75 60 55 53  BILITOT 0.4 0.5 0.7 0.7 0.4  PROT 7.7 7.4 6.4* 6.2* 6.2*  ALBUMIN 3.1* 3.2* 2.7* 2.6* 2.4*   Recent Labs  Lab 01/18/18 1027  LIPASE 27  AMYLASE 44   No results for input(s): AMMONIA in the last 168 hours. CBC: Recent Labs  Lab 01/18/18 1027 01/21/18 0408 01/22/18 0445 01/23/18 0411 01/24/18 0430  WBC 23.7* 15.8* 15.4* 15.9* 15.6*  NEUTROABS 21.3*  --  11.5* 11.6* 11.1*  HGB 11.8* 10.0* 10.0* 10.1* 10.6*  HCT 37.2* 32.2* 32.7* 33.4* 33.9*  MCV 80.9 83.4 82.0 85.0 80.9  PLT 281 246 298 326 451*   Cardiac Enzymes: No results for input(s): CKTOTAL, CKMB, CKMBINDEX, TROPONINI in the last 168 hours. BNP: BNP (last 3 results) No results for input(s): BNP in the last 8760 hours.  ProBNP (last 3 results) No results for input(s): PROBNP in the last 8760 hours.  CBG: No results for input(s): GLUCAP in the last 168 hours.     Signed:  Nita Sells MD   [facility] 01/24/2018, 10:28 AM

## 2018-01-24 NOTE — Progress Notes (Signed)
PHARMACY CONSULT NOTE FOR:  OUTPATIENT  PARENTERAL ANTIBIOTIC THERAPY (OPAT)  Indication: Intraabdominal infection Regimen: Rocephin 2g IV daily/Flagyl 555m IV q8 End date: 02/19/18  IV antibiotic discharge orders are pended. To discharging provider:  please sign these orders via discharge navigator,  Select New Orders & click on the button choice - Manage This Unsigned Work.     Thank you for allowing pharmacy to be a part of this patient's care.  LKara Mead1/20/2020, 10:02 AM

## 2018-01-24 NOTE — Care Management Important Message (Signed)
Important Message  Patient Details  Name: Cody Suarez MRN: [id number] Date of Birth: Jul 24, 1945   Medicare Important Message Given:  Yes    Kerin Salen 01/24/2018, 12:53 [place] Message  Patient Details  Name: Cody Suarez MRN: [id number] Date of Birth: [birth year]   Medicare Important Message Given:  Yes    Kerin Salen 01/24/2018, 12:53 PM

## 2018-01-24 NOTE — Care Management Note (Signed)
Case Management Note  Patient Details  Name: Cody Suarez MRN: [id number] Date of Birth: 12/16/1945  Subjective/Objective:                    Action/Plan:   Expected Discharge Date:  01/24/18               Expected Discharge Plan:  [place]  In-House Referral:     Discharge planning Services  CM Consult  Post Acute Care Choice:    Choice offered to:  Patient  DME Arranged:    DME Agency:     HH Arranged:  RN [place] Agency:  [facility]  Status of Service:  Completed, signed off  If discussed at [place] of Stay Meetings, dates discussed:    Additional CommentsPurcell Mouton, RN 01/24/2018, 12:20 PM

## 2018-01-25 ENCOUNTER — Ambulatory Visit: Payer: Self-pay | Admitting: *Deleted

## 2018-01-25 DIAGNOSIS — C7889 Secondary malignant neoplasm of other digestive organs: Secondary | ICD-10-CM | POA: Diagnosis not present

## 2018-01-25 DIAGNOSIS — Z7952 Long term (current) use of systemic steroids: Secondary | ICD-10-CM | POA: Diagnosis not present

## 2018-01-25 DIAGNOSIS — K651 Peritoneal abscess: Secondary | ICD-10-CM | POA: Diagnosis not present

## 2018-01-25 DIAGNOSIS — Z85038 Personal history of other malignant neoplasm of large intestine: Secondary | ICD-10-CM | POA: Diagnosis not present

## 2018-01-25 DIAGNOSIS — Z5181 Encounter for therapeutic drug level monitoring: Secondary | ICD-10-CM | POA: Diagnosis not present

## 2018-01-25 DIAGNOSIS — Z452 Encounter for adjustment and management of vascular access device: Secondary | ICD-10-CM | POA: Diagnosis not present

## 2018-01-25 DIAGNOSIS — Z79899 Other long term (current) drug therapy: Secondary | ICD-10-CM | POA: Diagnosis not present

## 2018-01-25 DIAGNOSIS — Z7901 Long term (current) use of anticoagulants: Secondary | ICD-10-CM | POA: Diagnosis not present

## 2018-01-25 DIAGNOSIS — Z792 Long term (current) use of antibiotics: Secondary | ICD-10-CM | POA: Diagnosis not present

## 2018-01-25 LAB — CULTURE, BLOOD (ROUTINE X 2)
Culture: NO GROWTH
Culture: NO GROWTH
Special Requests: ADEQUATE
Special Requests: ADEQUATE

## 2018-01-25 NOTE — Telephone Encounter (Signed)
Spoke with Provider Raiford Noble.  Patient has already had his hospital follow-up.  He advised that patient can try melatonin 80m or Pure ZZZ's.    Patient has tried melatonin before and it did not work. But he is going to try the pure ZZZ's.   He is aware that if this does not work he can call [facility] and we will figure something else out.  Patient states that he appreciated [facility] into this and explaining WHY he didn't need to take the tylenol.

## 2018-01-25 NOTE — Telephone Encounter (Signed)
Do not take until [facility] follow-up with Dr. Birdie Riddle.

## 2018-01-25 NOTE — Telephone Encounter (Signed)
Pt released from hospital yesterday. States MD who discharged him advised not to take the Tylenol PM he takes at night on occasion. States "There were so many doctors in my room  I didn't know who to ask; thought Dr. Birdie Riddle might know." Pt states he does take Tylenol 534m 2 tabs daily at times, "Not every day."  States the Tylenol PM "Helps me relax so I can sleep."  Please advise: [phone] Reason for Disposition . Caller has NON-URGENT medication question about med that PCP prescribed and triager unable to answer question    OTC med question  Answer Assessment - Initial Assessment Questions 1. SYMPTOMS: "Do you have any symptoms?"     no 2. SEVERITY: If symptoms are present, ask "Are they mild, moderate or severe?"     N/A  Protocols used: MEDICATION QUESTION CALL-A-AH

## 2018-01-26 ENCOUNTER — Telehealth: Payer: Self-pay | Admitting: *Deleted

## 2018-01-26 DIAGNOSIS — Z792 Long term (current) use of antibiotics: Secondary | ICD-10-CM | POA: Diagnosis not present

## 2018-01-26 DIAGNOSIS — Z452 Encounter for adjustment and management of vascular access device: Secondary | ICD-10-CM | POA: Diagnosis not present

## 2018-01-26 DIAGNOSIS — Z85038 Personal history of other malignant neoplasm of large intestine: Secondary | ICD-10-CM | POA: Diagnosis not present

## 2018-01-26 DIAGNOSIS — C7889 Secondary malignant neoplasm of other digestive organs: Secondary | ICD-10-CM | POA: Diagnosis not present

## 2018-01-26 DIAGNOSIS — K651 Peritoneal abscess: Secondary | ICD-10-CM | POA: Diagnosis not present

## 2018-01-26 DIAGNOSIS — Z5181 Encounter for therapeutic drug level monitoring: Secondary | ICD-10-CM | POA: Diagnosis not present

## 2018-01-26 NOTE — Telephone Encounter (Signed)
Left appointment with Dr. Benay Spice for 02/09/18 at 1130 on home voice mail.

## 2018-01-31 ENCOUNTER — Ambulatory Visit (INDEPENDENT_AMBULATORY_CARE_PROVIDER_SITE_OTHER): Payer: Medicare Other | Admitting: Family Medicine

## 2018-01-31 ENCOUNTER — Other Ambulatory Visit: Payer: Self-pay

## 2018-01-31 ENCOUNTER — Encounter: Payer: Self-pay | Admitting: Family Medicine

## 2018-01-31 VITALS — BP 125/70 | HR 64 | Temp 98.4°F | Resp 16 | Ht 70.0 in | Wt 159.0 lb

## 2018-01-31 DIAGNOSIS — C7889 Secondary malignant neoplasm of other digestive organs: Secondary | ICD-10-CM | POA: Diagnosis not present

## 2018-01-31 DIAGNOSIS — K651 Peritoneal abscess: Secondary | ICD-10-CM

## 2018-01-31 DIAGNOSIS — Z5181 Encounter for therapeutic drug level monitoring: Secondary | ICD-10-CM | POA: Diagnosis not present

## 2018-01-31 DIAGNOSIS — Z85038 Personal history of other malignant neoplasm of large intestine: Secondary | ICD-10-CM | POA: Diagnosis not present

## 2018-01-31 DIAGNOSIS — Z452 Encounter for adjustment and management of vascular access device: Secondary | ICD-10-CM | POA: Diagnosis not present

## 2018-01-31 DIAGNOSIS — Z792 Long term (current) use of antibiotics: Secondary | ICD-10-CM | POA: Diagnosis not present

## 2018-01-31 LAB — CBC AND DIFFERENTIAL
HCT: 36 — AB (ref 41–53)
Hemoglobin: 11.3 — AB (ref 13.5–17.5)
Neutrophils Absolute: 11
Platelets: 562 — AB (ref 150–399)
WBC: 14.6

## 2018-01-31 LAB — BASIC METABOLIC PANEL
BUN: 13 (ref 4–21)
Creatinine: 0.9 (ref 0.6–1.3)
GLUCOSE: 213
POTASSIUM: 4.6 (ref 3.4–5.3)
Sodium: 135 — AB (ref 137–147)

## 2018-01-31 NOTE — Assessment & Plan Note (Signed)
Pt is now on IV abx through his PICC line and improving.  HH is doing labs as directed by ID.  He does not require ID follow up but will follow up w/ Dr Benay Spice on 2/5.  Will continue to follow along and assist as able.

## 2018-01-31 NOTE — Progress Notes (Signed)
   Subjective:    Patient ID: Cody Suarez, male    DOB: September 13, 1945, 73 y.o.   MRN: [id number]  [facility] f/u- pt was readmitted on 1/16-1/20 for intraabdominal abscess.  During this visit, it was not felt that he was a candidate for IR drain placement or surgery.  He was started on IV Ceftriaxone 2 grams daily and Flagyl TID per ID.  Surgery and Oncology are following his abx course that is supposed to extend until 2/16.  D/C summary suggests repeat CBC/BMP- but I am not sure if [facility] is drawing this off PICC line, pt says this was done today.  Denies abd pain, fevers or chills.  Fatigue is 'a lot better'.  Has f/u w/ Dr Ammie Dalton on 2/5.  Plan per pt is to completely resolve infxn, gain some weight, and then discuss chemo options.   Review of Systems For ROS see HPI     Objective:   Physical Exam Vitals signs reviewed.  Constitutional:      General: He is not in acute distress.    Appearance: He is well-developed.     Comments: Thin appearing  HENT:     Head: Normocephalic and atraumatic.  Eyes:     Conjunctiva/sclera: Conjunctivae normal.     Pupils: Pupils are equal, round, and reactive to light.  Neck:     Musculoskeletal: Normal range of motion and neck supple.     Thyroid: No thyromegaly.  Cardiovascular:     Rate and Rhythm: Normal rate and regular rhythm.     Heart sounds: Normal heart sounds. No murmur.     Comments: PICC line in place R upper arm Pulmonary:     Effort: Pulmonary effort is normal. No respiratory distress.     Breath sounds: Normal breath sounds.  Abdominal:     General: Bowel sounds are normal. There is no distension.     Palpations: Abdomen is soft.  Lymphadenopathy:     Cervical: No cervical adenopathy.  Skin:    General: Skin is warm and dry.  Neurological:     Mental Status: He is alert and oriented to person, place, and time.     Cranial Nerves: No cranial nerve deficit.  Psychiatric:        Behavior: Behavior normal.             Assessment & Plan:

## 2018-01-31 NOTE — Patient Instructions (Signed)
Follow up as needed or as scheduled If you have any issues before your follow up w/ Dr Ammie Dalton- let me know or call his office! We'll let you know about the lab results and make any changes if needed Continue to eat regularly to put on some pounds!  Add Ensure or other protein shake to gain some weight back Call with any questions or concerns You're doing great!!!

## 2018-02-01 ENCOUNTER — Telehealth: Payer: Self-pay | Admitting: Family Medicine

## 2018-02-01 NOTE — Telephone Encounter (Signed)
Received [facility] Certification forms by fax, placed in bin upfront w/charge sheet.

## 2018-02-02 NOTE — Telephone Encounter (Signed)
Paperwork placed in PCP folder

## 2018-02-03 ENCOUNTER — Encounter: Payer: Self-pay | Admitting: General Practice

## 2018-02-04 DIAGNOSIS — Z7901 Long term (current) use of anticoagulants: Secondary | ICD-10-CM

## 2018-02-04 DIAGNOSIS — Z7952 Long term (current) use of systemic steroids: Secondary | ICD-10-CM | POA: Diagnosis not present

## 2018-02-04 DIAGNOSIS — Z5181 Encounter for therapeutic drug level monitoring: Secondary | ICD-10-CM | POA: Diagnosis not present

## 2018-02-04 DIAGNOSIS — Z79899 Other long term (current) drug therapy: Secondary | ICD-10-CM

## 2018-02-04 DIAGNOSIS — Z792 Long term (current) use of antibiotics: Secondary | ICD-10-CM | POA: Diagnosis not present

## 2018-02-04 DIAGNOSIS — Z452 Encounter for adjustment and management of vascular access device: Secondary | ICD-10-CM | POA: Diagnosis not present

## 2018-02-04 DIAGNOSIS — C7889 Secondary malignant neoplasm of other digestive organs: Secondary | ICD-10-CM

## 2018-02-04 DIAGNOSIS — Z85038 Personal history of other malignant neoplasm of large intestine: Secondary | ICD-10-CM | POA: Diagnosis not present

## 2018-02-04 DIAGNOSIS — K651 Peritoneal abscess: Secondary | ICD-10-CM | POA: Diagnosis not present

## 2018-02-04 NOTE — Telephone Encounter (Signed)
Form completed and placed in basket  

## 2018-02-07 DIAGNOSIS — Z85038 Personal history of other malignant neoplasm of large intestine: Secondary | ICD-10-CM | POA: Diagnosis not present

## 2018-02-07 DIAGNOSIS — Z452 Encounter for adjustment and management of vascular access device: Secondary | ICD-10-CM | POA: Diagnosis not present

## 2018-02-07 DIAGNOSIS — C7889 Secondary malignant neoplasm of other digestive organs: Secondary | ICD-10-CM | POA: Diagnosis not present

## 2018-02-07 DIAGNOSIS — Z792 Long term (current) use of antibiotics: Secondary | ICD-10-CM | POA: Diagnosis not present

## 2018-02-07 DIAGNOSIS — K651 Peritoneal abscess: Secondary | ICD-10-CM | POA: Diagnosis not present

## 2018-02-07 DIAGNOSIS — Z5181 Encounter for therapeutic drug level monitoring: Secondary | ICD-10-CM | POA: Diagnosis not present

## 2018-02-09 ENCOUNTER — Inpatient Hospital Stay: Payer: Medicare Other | Attending: Oncology | Admitting: Oncology

## 2018-02-09 ENCOUNTER — Telehealth: Payer: Self-pay

## 2018-02-09 VITALS — BP 155/68 | HR 100 | Temp 97.7°F | Resp 18 | Ht 70.0 in | Wt 156.4 lb

## 2018-02-09 DIAGNOSIS — C7889 Secondary malignant neoplasm of other digestive organs: Secondary | ICD-10-CM | POA: Insufficient documentation

## 2018-02-09 DIAGNOSIS — I10 Essential (primary) hypertension: Secondary | ICD-10-CM | POA: Diagnosis not present

## 2018-02-09 DIAGNOSIS — C184 Malignant neoplasm of transverse colon: Secondary | ICD-10-CM

## 2018-02-09 NOTE — Telephone Encounter (Signed)
Printed avs and calender of upcoming appointment. Per 2/5 los

## 2018-02-09 NOTE — Progress Notes (Signed)
[place] OFFICE PROGRESS NOTE   Diagnosis: Colon cancer  INTERVAL HISTORY:   Cody Suarez was admitted on 01/20/2018 with fever, chills, and failure to thrive.  His symptoms were felt to be related to infection at the left upper quadrant.  There was no drainable fluid collection.  He was placed on ceftriaxone and Flagyl.  He continues antibiotics via a PICC.  He reports feeling much better.  No fever or chills.  Good energy level.  Improved appetite.  Objective:  Vital signs in last 24 hours:  Blood pressure (!) 155/68, pulse 100, temperature 97.7 F (36.5 C), temperature source Oral, resp. rate 18, height 5' 10"  (1.778 m), weight 156 lb 6.4 oz (70.9 kg), SpO2 100 %.    HEENT: No thrush Resp: Lungs clear bilaterally Cardio: Regular rate and rhythm GI: No hepatomegaly, no mass, nontender Vascular: No leg edema    Portacath/PICC-without erythema  Lab Results:  Lab Results  Component Value Date   WBC 14.6 01/31/2018   HGB 11.3 (A) 01/31/2018   HCT 36 (A) 01/31/2018   MCV 80.9 01/24/2018   PLT 562 (A) 01/31/2018   NEUTROABS 11 01/31/2018    CMP  Lab Results  Component Value Date   NA 135 (A) 01/31/2018   K 4.6 01/31/2018   CL 110 01/23/2018   CO2 17 (L) 01/23/2018   GLUCOSE 124 (H) 01/23/2018   BUN 13 01/31/2018   CREATININE 0.9 01/31/2018   CALCIUM 7.9 (L) 01/23/2018   PROT 6.2 (L) 01/23/2018   ALBUMIN 2.4 (L) 01/23/2018   AST 13 (L) 01/23/2018   ALT 8 01/23/2018   ALKPHOS 53 01/23/2018   BILITOT 0.4 01/23/2018   GFRNONAA >60 01/23/2018   GFRAA >60 01/23/2018    Medications: I have reviewed the patient's current medications.   Assessment/Plan: 1. 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 Negative for a BRAF mutation  Tumor invaded through the muscularis propria  into pericolonic fatty tissue and involved the attached omentum.   Cycle 1 adjuvant Xeloda 05/28/2013.   Cycle 2 adjuvant Xeloda 06/18/2013.   Cycle 3 adjuvant Xeloda 07/09/2013.   Cycle 4 adjuvant Xeloda 07/30/2013.   Cycle 5 adjuvant Xeloda 08/20/2013.  Cycle 6 adjuvant Xeloda 09/11/2013.  cycle 7 adjuvant Xeloda 10/02/2013  Cycle 8 adjuvant Xeloda 10/23/2013  CTs of the chest, abdomen, and pelvis on 07/18/2014-negative for recurrent colon cancer   CTs abdomen/pelvis 09/06/2014 showed acute cholecystitis.  Colonoscopy 08/02/2015-chronic colitis, no malignancy  CTs 09/17/2015, new mass near the tail the pancreas, stable lung nodules  Biopsy mesenteric mass 10/04/2015 with metastatic adenocarcinoma consistent with colorectal primary  PET scan 10/10/2015 with soft tissue thickening within the peritoneal space of the left upper quadrant with SUV Max 7.9; no additional sites of peritoneal nodularity to suggest metastasis; hypermetabolic ill-defined nodule in the left lower lobe favored inflammatory.  Resection of the left abdominal mesenteric mass/tail the pancreas on 11/05/2015 with the pathology confirming metastatic colon cancer, positive "serosal" margin  CTs 07/29/2016-no evidence of metastatic disease, stable small lung nodules, decreased size of pancreatic resection bed fluid/mass  CTs 06/28/2017-decreased fluid and increased soft tissue at the area of the distal pancreatectomy with a few foci of gas  CT 09/29/2017-enlargement of left upper quadrant soft tissue mass between the stomach, spleen, and pancreas tail, stable nodules at the lung bases  CT biopsy of the upper  quadrant mass 10/19/2017-metastatic colon cancer, MSS, tumor mutation burden 1, no KRAS, BRAF,  or NRAS mutation  CTs 12/24/2017- complex left upper quadrant process involving the stomach, spleen, tail the pancreas, and distal transverse colon. 2. Ulcerative colitis-extensive chronic active ulcerative  colitis was noted on the colon resection specimen 03/28/2013.  Followed by Dr.Magod 3. Hypertension.  4. History of microcytic anemia-likely iron deficiency, unable to tolerate oral iron. Improved. 5. Possible area of cecal wall thickening noted on abdominal CT 03/20/2013. 6. Family history of colon cancer (maternal grandfather died in his 17s with colon cancer). 7. Bilateral calf and low anterior leg pain. Bilateral lower extremity venous duplex negative for DVT 07/14/2013. Right ABI with moderate, borderline severe arterial insufficiency; left ABI suggestive of moderate arterial insufficiency. He was referred to vascular. 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 TEE showed thrombus in the descending aorta. He underwent right popliteal to peroneal artery bypass graft, thrombectomy anterior tibialis and attempted thrombectomy tibioperoneal trunk and posterior tibialis on 08/11/2013. Right calf pain 09/26/2013 with findings of occlusion of right popliteal to peroneal bypass status post thrombolysis. Now on anticoagulation with Xarelto.  recurrent pain and ulceration at the right foot, status post a popliteal to posterior tibial artery bypass using a cephalic vein graft on 16/10/9602 8. Status post laparoscopic cholecystectomy 09/08/2014 9. Anorexia following the mesenteric mass/pancreas tail resection-improved 10. Abdominal abscess November 2017-pancreas "cysts "drainage catheter placed 11/26/2015; CT 02/04/2016 with no change to slight decreasein size of residual pancreatic tail fluid collection.Drainage catheter removed approximately early March 2018  CT 02/20/2016-new small left pleural effusion, increased size of the surgical bed fluid collection, indeterminate pulmonary nodules  CT 07/29/2016-decreased fluid/soft tissue density near the pancreas is no evidence of metastatic disease, stable subcentimeter lung nodules 11.  Admission 12/24/2017 with an abdominal abscess,  status post placement of a percutaneous drain and antibiotic therapy; CTs abdomen/pelvis 01/04/2018- near complete drainage of previously noted complex fluid collection in the left upper quadrant.  2 small residual collections remain.  Residual soft tissue mass at the clips concerning for residual recurrent neoplasm.  Peritoneal wall thickening also concerning for metastatic disease. Drain removed 01/04/2018  CT 01/18/2018- no change in soft tissue mass at the tail of pancreas with involvement of the gastric fundus and spleen, there is central low attenuation  Admission 01/20/2018 with fever and failure to thrive, no drainable fluid collection, placed on IV antibiotics, discharged to complete an outpatient course of IV ceftriaxone and Flagyl to be completed 02/20/2018    Disposition: Cody Suarez appears well.  The fever/chills resolved and his performance status is much improved while on antibiotics for the past several weeks.  He will complete the course of antibiotics 02/20/2018.  It appears his symptoms requiring hospital admission last month were related to infection as opposed to metastatic colon cancer.  The plan is to remove the pick at the completion of antibiotic therapy.  He will return approximately 10 days later.  We will refer him to Dr. Hassell Done for Port-A-Cath placement if he remains afebrile and clinically well.  The plan is to initiate systemic therapy for colon cancer if his performance status continues to improve.  Betsy Coder, MD  02/09/2018  3:38 PM

## 2018-02-09 NOTE — Progress Notes (Signed)
Called [facility] and gave verbal order for nurse to remove PICC line when last antibiotic dose is completed per Dr. Benay Spice.

## 2018-02-14 DIAGNOSIS — Z452 Encounter for adjustment and management of vascular access device: Secondary | ICD-10-CM | POA: Diagnosis not present

## 2018-02-14 DIAGNOSIS — Z5181 Encounter for therapeutic drug level monitoring: Secondary | ICD-10-CM | POA: Diagnosis not present

## 2018-02-14 DIAGNOSIS — Z85038 Personal history of other malignant neoplasm of large intestine: Secondary | ICD-10-CM | POA: Diagnosis not present

## 2018-02-14 DIAGNOSIS — K651 Peritoneal abscess: Secondary | ICD-10-CM | POA: Diagnosis not present

## 2018-02-14 DIAGNOSIS — Z792 Long term (current) use of antibiotics: Secondary | ICD-10-CM | POA: Diagnosis not present

## 2018-02-14 DIAGNOSIS — C7889 Secondary malignant neoplasm of other digestive organs: Secondary | ICD-10-CM | POA: Diagnosis not present

## 2018-02-14 LAB — CBC AND DIFFERENTIAL
HCT: 36 — AB (ref 41–53)
Hemoglobin: 12 — AB (ref 13.5–17.5)
Neutrophils Absolute: 9
PLATELETS: 331 (ref 150–399)
WBC: 13.2

## 2018-02-14 LAB — BASIC METABOLIC PANEL
BUN: 13 (ref 4–21)
Creatinine: 0.7 (ref 0.6–1.3)
Glucose: 207
Potassium: 4.3 (ref 3.4–5.3)
Sodium: 137 (ref 137–147)

## 2018-02-15 ENCOUNTER — Telehealth: Payer: Self-pay | Admitting: Cardiovascular Disease

## 2018-02-15 NOTE — Telephone Encounter (Signed)
Please call regarding Xarelto, pt would not give any more information

## 2018-02-15 NOTE — Telephone Encounter (Signed)
Spoke with patient. He would like more samples. I asked him if he had completed patient assistance paper work and he said he had done so and was denied on 2 occasions. Discussed that on a longterm basis we cannot supply all the medication he needs through samples for each year and may need to come up with another solution. Advised I will let his nurse know and she may be contacting him.  Medication Samples have been left at front desk for the patient to pick up.  Drug name: Xarelto       Strength: 20 mg        Qty: 4 bottles  LOT: [id number]  Exp.Date: 11/21

## 2018-02-16 ENCOUNTER — Encounter: Payer: Self-pay | Admitting: General Practice

## 2018-02-17 ENCOUNTER — Other Ambulatory Visit: Payer: Self-pay | Admitting: Pharmacist

## 2018-02-17 ENCOUNTER — Telehealth: Payer: Self-pay | Admitting: Cardiovascular Disease

## 2018-02-17 MED ORDER — RIVAROXABAN 20 MG PO TABS: 20.0000 mg | ORAL_TABLET | Freq: Every day | ORAL | 1 refills | Status: DC

## 2018-02-17 NOTE — Telephone Encounter (Signed)
New message     *STAT* If patient is at the pharmacy, call can be transferred to refill team.   1. Which medications need to be refilled? (please list name of each medication and dose if known)rivaroxaban (XARELTO) 20 MG TABS tablet  2. Which pharmacy/location (including street and city if local pharmacy) is medication to be sent to?[place], [address]  3. Do they need a 30 day or 90 day supply? [place]

## 2018-02-21 DIAGNOSIS — Z5181 Encounter for therapeutic drug level monitoring: Secondary | ICD-10-CM | POA: Diagnosis not present

## 2018-02-21 DIAGNOSIS — Z452 Encounter for adjustment and management of vascular access device: Secondary | ICD-10-CM | POA: Diagnosis not present

## 2018-02-21 DIAGNOSIS — Z792 Long term (current) use of antibiotics: Secondary | ICD-10-CM | POA: Diagnosis not present

## 2018-02-21 DIAGNOSIS — K651 Peritoneal abscess: Secondary | ICD-10-CM | POA: Diagnosis not present

## 2018-02-21 DIAGNOSIS — C7889 Secondary malignant neoplasm of other digestive organs: Secondary | ICD-10-CM | POA: Diagnosis not present

## 2018-02-21 DIAGNOSIS — Z85038 Personal history of other malignant neoplasm of large intestine: Secondary | ICD-10-CM | POA: Diagnosis not present

## 2018-03-01 ENCOUNTER — Other Ambulatory Visit: Payer: Self-pay | Admitting: Cardiovascular Disease

## 2018-03-01 NOTE — Telephone Encounter (Signed)
Please review for refill, Thanks !  

## 2018-03-02 ENCOUNTER — Telehealth: Payer: Self-pay | Admitting: Oncology

## 2018-03-02 ENCOUNTER — Inpatient Hospital Stay (HOSPITAL_BASED_OUTPATIENT_CLINIC_OR_DEPARTMENT_OTHER): Payer: Medicare Other | Admitting: Oncology

## 2018-03-02 VITALS — BP 154/63 | HR 93 | Temp 97.7°F | Resp 19 | Ht 70.0 in | Wt 156.2 lb

## 2018-03-02 DIAGNOSIS — I10 Essential (primary) hypertension: Secondary | ICD-10-CM

## 2018-03-02 DIAGNOSIS — C184 Malignant neoplasm of transverse colon: Secondary | ICD-10-CM | POA: Diagnosis not present

## 2018-03-02 DIAGNOSIS — C7889 Secondary malignant neoplasm of other digestive organs: Secondary | ICD-10-CM

## 2018-03-02 NOTE — Progress Notes (Signed)
[place] OFFICE PROGRESS NOTE   Diagnosis: Colon cancer  INTERVAL HISTORY:    Mr. Kirksey returns for a scheduled visit.  He completed the course of IV antibiotics 02/20/2018.  He continues metronidazole.  No fever or chills.  He feels well.  He reports continued improvement in his appetite and energy level.  No bleeding or pain.  Objective:  Vital signs in last 24 hours:  Blood pressure (!) 154/63, pulse 93, temperature 97.7 F (36.5 C), temperature source Oral, resp. rate 19, height _0  (1.778 m), weight 156 lb 3.2 oz (70.9 kg), SpO2 99 %.  Resp: Lungs clear bilaterally Cardio: Regular rate and rhythm GI: No hepatosplenomegaly, nontender, no mass Vascular: No leg edema     Lab Results:  Lab Results  Component Value Date   WBC 13.2 02/14/2018   HGB 12.0 (A) 02/14/2018   HCT 36 (A) 02/14/2018   MCV 80.9 01/24/2018   PLT 331 02/14/2018   NEUTROABS 9 02/14/2018    CMP  Lab Results  Component Value Date   NA 137 02/14/2018   K 4.3 02/14/2018   CL 110 01/23/2018   CO2 17 (L) 01/23/2018   GLUCOSE 124 (H) 01/23/2018   BUN 13 02/14/2018   CREATININE 0.7 02/14/2018   CALCIUM 7.9 (L) 01/23/2018   PROT 6.2 (L) 01/23/2018   ALBUMIN 2.4 (L) 01/23/2018   AST 13 (L) 01/23/2018   ALT 8 01/23/2018   ALKPHOS 53 01/23/2018   BILITOT 0.4 01/23/2018   GFRNONAA >60 01/23/2018   GFRAA >60 01/23/2018    Lab Results  Component Value Date   CEA1 3.63 09/20/2017     Medications: I have reviewed the patient's current medications.   Assessment/Plan: 1. 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 Negative for a BRAF mutation  Tumor invaded through the muscularis propria into pericolonic fatty tissue and involved the attached omentum.   Cycle 1 adjuvant Xeloda 05/28/2013.    Cycle 2 adjuvant Xeloda 06/18/2013.   Cycle 3 adjuvant Xeloda 07/09/2013.   Cycle 4 adjuvant Xeloda 07/30/2013.   Cycle 5 adjuvant Xeloda 08/20/2013.  Cycle 6 adjuvant Xeloda 09/11/2013.  cycle 7 adjuvant Xeloda 10/02/2013  Cycle 8 adjuvant Xeloda 10/23/2013  CTs of the chest, abdomen, and pelvis on 07/18/2014-negative for recurrent colon cancer   CTs abdomen/pelvis 09/06/2014 showed acute cholecystitis.  Colonoscopy 08/02/2015-chronic colitis, no malignancy  CTs 09/17/2015, new mass near the tail the pancreas, stable lung nodules  Biopsy mesenteric mass 10/04/2015 with metastatic adenocarcinoma consistent with colorectal primary  PET scan 10/10/2015 with soft tissue thickening within the peritoneal space of the left upper quadrant with SUV Max 7.9; no additional sites of peritoneal nodularity to suggest metastasis; hypermetabolic ill-defined nodule in the left lower lobe favored inflammatory.  Resection of the left abdominal mesenteric mass/tail the pancreas on 11/05/2015 with the pathology confirming metastatic colon cancer, positive "serosal" margin  CTs 07/29/2016-no evidence of metastatic disease, stable small lung nodules, decreased size of pancreatic resection bed fluid/mass  CTs 06/28/2017-decreased fluid and increased soft tissue at the area of the distal pancreatectomy with a few foci of gas  CT 09/29/2017-enlargement of left upper quadrant soft tissue mass between the stomach, spleen, and pancreas tail, stable nodules at the lung bases  CT biopsy of the upper quadrant mass 10/19/2017-metastatic colon cancer, MSS, tumor mutation burden 1, no KRAS, BRAF,  or NRAS  mutation  CTs 12/24/2017- complex left upper quadrant process involving the stomach, spleen, tail the pancreas, and distal transverse colon. 2. Ulcerative colitis-extensive chronic active ulcerative colitis was noted on the colon resection specimen 03/28/2013.  Followed by Dr.Magod 3. Hypertension.   4. History of microcytic anemia-likely iron deficiency, unable to tolerate oral iron. Improved. 5. Possible area of cecal wall thickening noted on abdominal CT 03/20/2013. 6. Family history of colon cancer (maternal grandfather died in his 33s with colon cancer). 7. Bilateral calf and low anterior leg pain. Bilateral lower extremity venous duplex negative for DVT 07/14/2013. Right ABI with moderate, borderline severe arterial insufficiency; left ABI suggestive of moderate arterial insufficiency. He was referred to vascular. 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 TEE showed thrombus in the descending aorta. He underwent right popliteal to peroneal artery bypass graft, thrombectomy anterior tibialis and attempted thrombectomy tibioperoneal trunk and posterior tibialis on 08/11/2013. Right calf pain 09/26/2013 with findings of occlusion of right popliteal to peroneal bypass status post thrombolysis. Now on anticoagulation with Xarelto.  recurrent pain and ulceration at the right foot, status post a popliteal to posterior tibial artery bypass using a cephalic vein graft on 65/46/5035 8. Status post laparoscopic cholecystectomy 09/08/2014 9. Anorexia following the mesenteric mass/pancreas tail resection-improved 10. Abdominal abscess November 2017-pancreas "cysts "drainage catheter placed 11/26/2015; CT 02/04/2016 with no change to slight decreasein size of residual pancreatic tail fluid collection.Drainage catheter removed approximately early March 2018  CT 02/20/2016-new small left pleural effusion, increased size of the surgical bed fluid collection, indeterminate pulmonary nodules  CT 07/29/2016-decreased fluid/soft tissue density near the pancreas is no evidence of metastatic disease, stable subcentimeter lung nodules 11.  Admission 12/24/2017 with an abdominal abscess, status post placement of a percutaneous drain and antibiotic therapy; CTs abdomen/pelvis 01/04/2018-  near complete drainage of previously noted complex fluid collection in the left upper quadrant.  2 small residual collections remain.  Residual soft tissue mass at the clips concerning for residual recurrent neoplasm.  Peritoneal wall thickening also concerning for metastatic disease. Drain removed 01/04/2018  CT 01/18/2018- no change in soft tissue mass at the tail of pancreas with involvement of the gastric fundus and spleen, there is central low attenuation  Admission 01/20/2018 with fever and failure to thrive, no drainable fluid collection, placed on IV antibiotics, discharged to complete an outpatient course of IV ceftriaxone and Flagyl to be completed 02/20/2018   Disposition: Mr. Demario appears well.  He completed a course of IV antibiotics for a recurrent abdominal abscess.  He has metastatic colon cancer.  I discussed treatment options with Mr. Cocuzza and his wife.  We discussed placement of a Port-A-Cath and systemic therapy options.  He does not wish to begin treatment at present.  He will undergo a restaging CT in approximately 2 months.  He will return for an office visit then.  The plan is to begin systemic therapy if he develops significant progression on the CT or symptoms attributable to cancer.  He will contact us in the interim as needed.  Betsy Coder, MD  03/02/2018  5:22 PM

## 2018-03-02 NOTE — Telephone Encounter (Signed)
Scheduled appt per 02/26 los.  Printed calendar and avs.

## 2018-03-15 ENCOUNTER — Other Ambulatory Visit: Payer: Self-pay | Admitting: Family Medicine

## 2018-03-16 ENCOUNTER — Other Ambulatory Visit: Payer: Self-pay | Admitting: Cardiovascular Disease

## 2018-03-16 MED ORDER — RIVAROXABAN 20 MG PO TABS: 20.0000 mg | ORAL_TABLET | Freq: Every day | ORAL | 1 refills | Status: DC

## 2018-03-16 NOTE — Telephone Encounter (Signed)
Patient calling the office for samples of medication:   1.  What medication and dosage are you requesting samples for? Xarelto  2.  Are you currently out of this medication?a few 3 left

## 2018-03-17 ENCOUNTER — Other Ambulatory Visit: Payer: Self-pay

## 2018-03-17 MED ORDER — RIVAROXABAN 20 MG PO TABS: 20.0000 mg | ORAL_TABLET | Freq: Every day | ORAL | 1 refills | Status: DC

## 2018-03-17 NOTE — Telephone Encounter (Signed)
Medication Samples have been provided for the patient to pick up at front desk.  Drug name: XARELTO       Strength: 20 MG        Qty: 2 BOTTLES  LOT: [id number]  Exp.Date: 11/2019

## 2018-03-21 ENCOUNTER — Ambulatory Visit (INDEPENDENT_AMBULATORY_CARE_PROVIDER_SITE_OTHER): Payer: Medicare Other | Admitting: Physician Assistant

## 2018-03-21 ENCOUNTER — Other Ambulatory Visit: Payer: Self-pay

## 2018-03-21 ENCOUNTER — Encounter: Payer: Self-pay | Admitting: Physician Assistant

## 2018-03-21 VITALS — BP 132/70 | HR 100 | Temp 97.9°F | Resp 16 | Ht 70.0 in | Wt 156.0 lb

## 2018-03-21 DIAGNOSIS — B029 Zoster without complications: Secondary | ICD-10-CM | POA: Diagnosis not present

## 2018-03-21 MED ORDER — VALACYCLOVIR HCL 1 G PO TABS: 1000.0000 mg | ORAL_TABLET | Freq: Three times a day (TID) | ORAL | 0 refills | Status: DC

## 2018-03-21 NOTE — Progress Notes (Signed)
Patient presents to clinic today c/o 1.5 days of painful, blistering rash of R lower side and back. Notes the rash is spreading more on his back. Denies recent travel or sick contact. Denies fever, chills. Denies history of shingles but has had the chicken pox as a child. Pain is 3/10. Has not taken anything for this.  Past Medical History:  Diagnosis Date  . Anemia of chronic disease 2015   "related to cancer tx" (08/09/2013)  . Blood clot in vein 2015  . Colon cancer ([place]) 03/2013  . History of kidney stones    x 2passed them both  . Hypertension   . Peripheral vascular disease ([place])   . Ulcerative colitis ([place])    history    Current Outpatient Medications on File Prior to Visit  Medication Sig Dispense Refill  . acetaminophen (TYLENOL) 500 MG tablet Take 1,000 mg by mouth daily as needed for mild pain or headache.     . cefTRIAXone (ROCEPHIN) IVPB Inject 2 g into the vein daily. Indication:  Intraabdominal infection Last Day of Therapy:  02/19/18 Labs - Once weekly:  CBC/D and BMP, Labs - Every other week:  ESR and CRP 27 Units 0  . Coenzyme Q10 (COQ10 PO) Take 1 capsule by mouth daily.    . Homeopathic Products [facility] RELIEF EX) Apply 1 application topically daily as needed (calf pain).    . Liniments (BLUE-EMU SUPER STRENGTH EX) Apply 1 application topically daily as needed (knee pain).    Marland Kitchen losartan (COZAAR) 100 MG tablet Take 0.5 tablets (50 mg total) by mouth daily. 45 tablet 2  . megestrol (MEGACE) 40 MG tablet Take 4 tablets (160 mg total) by mouth daily. 30 tablet 11  . Multiple Vitamin (MULTIVITAMIN WITH MINERALS) TABS tablet Take 1 tablet by mouth daily.    . ondansetron (ZOFRAN) 4 MG tablet TAKE 1 TABLET BY MOUTH EVERY 8 HOURS AS NEEDED FOR NAUSEA OR VOMITING 20 tablet 0  . predniSONE (DELTASONE) 2.5 MG tablet Take 1 tablet (2.5 mg total) by mouth daily before breakfast.    . rivaroxaban (XARELTO) 20 MG TABS tablet Take 1 tablet (20 mg total) by mouth daily with  breakfast. 30 tablet 1  . spironolactone (ALDACTONE) 25 MG tablet TAKE 1 TABLET BY MOUTH ONCE DAILY 30 tablet 5  . sulfaSALAzine (AZULFIDINE) 500 MG EC tablet Take 500 mg by mouth daily.   3   No current facility-administered medications on file prior to visit.     Allergies  Allergen Reactions  . Amlodipine Swelling    Right LE edema  . Gabapentin Rash    Short-term memory decrease    Family History  Problem Relation Age of Onset  . Heart disease Mother   . Emphysema Father     Social History   Socioeconomic History  . Marital status: Married    Spouse name: Not on file  . Number of children: Not on file  . Years of education: Not on file  . Highest education level: Not on file  Occupational History  . Not on file  Social Needs  . Financial resource strain: Not on file  . Food insecurity:    Worry: Not on file    Inability: Not on file  . Transportation needs:    Medical: Not on file    Non-medical: Not on file  Tobacco Use  . Smoking status: Never Smoker  . Smokeless tobacco: Never Used  . Tobacco comment: states his father died of  emphysema from smoking, he did not want to be like that  Substance and Sexual Activity  . Alcohol use: No  . Drug use: No  . Sexual activity: Yes  Lifestyle  . Physical activity:    Days per week: Patient refused    Minutes per session: Patient refused  . Stress: To some extent  Relationships  . Social connections:    Talks on phone: Patient refused    Gets together: Patient refused    Attends religious service: Patient refused    Active member of club or organization: Patient refused    Attends meetings of clubs or organizations: Patient refused    Relationship status: Patient refused  Other Topics Concern  . Not on file  Social History Narrative  . Not on file    Review of Systems - See HPI.  All other ROS are negative.  BP 132/70   Pulse 100   Temp 97.9 F (36.6 C) (Oral)   Resp 16   Ht 5' 10"  (1.778 m)   Wt  156 lb (70.8 kg)   SpO2 98%   BMI 22.38 kg/m   Physical Exam Vitals signs reviewed.  Constitutional:      Appearance: Normal appearance.  HENT:     Head: Normocephalic and atraumatic.     Mouth/Throat:     Mouth: Mucous membranes are moist.  Neck:     Musculoskeletal: Neck supple.  Cardiovascular:     Rate and Rhythm: Normal rate and regular rhythm.     Heart sounds: Normal heart sounds.  Pulmonary:     Effort: Pulmonary effort is normal.     Breath sounds: Normal breath sounds.  Skin:      Neurological:     Mental Status: He is alert.     Recent Results (from the past 2160 hour(s))  Comprehensive metabolic panel     Status: Abnormal   Collection Time: 12/24/17  4:42 PM  Result Value Ref Range   Sodium 131 (L) 135 - 145 mmol/L   Potassium 3.8 3.5 - 5.1 mmol/L   Chloride 94 (L) 98 - 111 mmol/L   CO2 22 22 - 32 mmol/L   Glucose, Bld 162 (H) 70 - 99 mg/dL   BUN 12 8 - 23 mg/dL   Creatinine, Ser 1.29 (H) 0.61 - 1.24 mg/dL   Calcium 8.7 (L) 8.9 - 10.3 mg/dL   Total Protein 7.8 6.5 - 8.1 g/dL   Albumin 3.0 (L) 3.5 - 5.0 g/dL   AST 15 15 - 41 U/L   ALT 13 0 - 44 U/L   Alkaline Phosphatase 75 38 - 126 U/L   Total Bilirubin 1.0 0.3 - 1.2 mg/dL   GFR calc non Af Amer 55 (L) >60 mL/min   GFR calc Af Amer >60 >60 mL/min   Anion gap 15 5 - 15    Comment: Performed at [facility], [address], [place], [place] [address]  CBC WITH DIFFERENTIAL     Status: Abnormal   Collection Time: 12/24/17  4:42 PM  Result Value Ref Range   WBC 25.2 (H) 4.0 - 10.5 K/uL   RBC 4.64 4.22 - 5.81 MIL/uL   Hemoglobin 11.8 (L) 13.0 - 17.0 g/dL   HCT 37.2 (L) 39.0 - 52.0 %   MCV 80.2 80.0 - 100.0 fL   MCH 25.4 (L) 26.0 - 34.0 pg   MCHC 31.7 30.0 - 36.0 g/dL   RDW 18.8 (H) 11.5 - 15.5 %   Platelets  493 (H) 150 - 400 K/uL   nRBC 0.0 0.0 - 0.2 %   Neutrophils Relative % 81 %   Neutro Abs 20.4 (H) 1.7 - 7.7 K/uL   Lymphocytes Relative 6 %   Lymphs Abs 1.5 0.7 - 4.0 K/uL    Monocytes Relative 11 %   Monocytes Absolute 2.8 (H) 0.1 - 1.0 K/uL   Eosinophils Relative 0 %   Eosinophils Absolute 0.0 0.0 - 0.5 K/uL   Basophils Relative 2 %   Basophils Absolute 0.5 (H) 0.0 - 0.1 K/uL   WBC Morphology See Note     Comment: >10% reactive, Benign Lymphocytes.   nRBC 0 0 /100 WBC   Abs Immature Granulocytes 0.00 0.00 - 0.07 K/uL   Burr Cells PRESENT     Comment: Performed at [facility], [place] [address] [place], [place] [address]  Protime-INR     Status: Abnormal   Collection Time: 12/24/17  4:42 PM  Result Value Ref Range   Prothrombin Time 34.2 (H) 11.4 - 15.2 seconds   INR 3.45     Comment: Performed at [facility] [address] [place],  [address]  Blood Culture (routine x 2)     Status: None   Collection Time: 12/24/17  4:53 PM  Result Value Ref Range   Specimen Description BLOOD LEFT ANTECUBITAL    Special Requests      BOTTLES DRAWN AEROBIC AND ANAEROBIC Blood Culture adequate volume   Culture NO GROWTH 5 DAYS    Report Status 12/29/2017 FINAL   I-stat troponin, ED (not at [facility], [facility])     Status: None   Collection Time: 12/24/17  5:03 PM  Result Value Ref Range   Troponin i, poc 0.01 0.00 - 0.08 ng/mL   Comment 3            Comment: Due to the release kinetics of cTnI, a negative result within the first hours of the onset of symptoms does not rule out myocardial infarction with certainty. If myocardial infarction is still suspected, repeat the test at appropriate intervals.   I-Stat CG4 Lactic Acid, ED     Status: Abnormal   Collection Time: 12/24/17  5:05 PM  Result Value Ref Range   Lactic Acid, Venous 1.93 (H) 0.5 - 1.9 mmol/L  Blood Culture (routine x 2)     Status: None   Collection Time: 12/24/17  5:20 PM  Result Value Ref Range   Specimen Description BLOOD LEFT FOREARM    Special Requests      BOTTLES DRAWN AEROBIC AND ANAEROBIC Blood Culture adequate volume   Culture NO GROWTH 5 DAYS    Report Status 12/29/2017  FINAL   Influenza panel by PCR (type A & B)     Status: None   Collection Time: 12/24/17  6:36 PM  Result Value Ref Range   Influenza A By PCR NEGATIVE NEGATIVE   Influenza B By PCR NEGATIVE NEGATIVE    Comment: (NOTE) The Xpert Xpress Flu assay is intended as an aid in the diagnosis of  influenza and should not be used as a sole basis for treatment.  This  assay is FDA approved for nasopharyngeal swab specimens only. Nasal  washings and aspirates are unacceptable for Xpert Xpress Flu testing. Performed at [facility], [place] [address], [place],  [address]   Urinalysis, Routine w reflex microscopic     Status: Abnormal   Collection Time: 12/24/17  7:50 PM  Result  Value Ref Range   Color, Urine YELLOW YELLOW   APPearance CLEAR CLEAR   Specific Gravity, Urine 1.012 1.005 - 1.030   pH 6.0 5.0 - 8.0   Glucose, UA NEGATIVE NEGATIVE mg/dL   Hgb urine dipstick NEGATIVE NEGATIVE   Bilirubin Urine NEGATIVE NEGATIVE   Ketones, ur 20 (A) NEGATIVE mg/dL   Protein, ur NEGATIVE NEGATIVE mg/dL   Nitrite NEGATIVE NEGATIVE   Leukocytes, UA NEGATIVE NEGATIVE    Comment: Performed at [facility] [address], [place], [place] [address]  Comprehensive metabolic panel     Status: Abnormal   Collection Time: 12/25/17  2:38 AM  Result Value Ref Range   Sodium 133 (L) 135 - 145 mmol/L   Potassium 3.9 3.5 - 5.1 mmol/L   Chloride 101 98 - 111 mmol/L   CO2 19 (L) 22 - 32 mmol/L   Glucose, Bld 161 (H) 70 - 99 mg/dL   BUN 10 8 - 23 mg/dL   Creatinine, Ser 1.21 0.61 - 1.24 mg/dL   Calcium 7.5 (L) 8.9 - 10.3 mg/dL   Total Protein 5.9 (L) 6.5 - 8.1 g/dL   Albumin 2.1 (L) 3.5 - 5.0 g/dL   AST 11 (L) 15 - 41 U/L   ALT 9 0 - 44 U/L   Alkaline Phosphatase 63 38 - 126 U/L   Total Bilirubin 0.7 0.3 - 1.2 mg/dL   GFR calc non Af Amer 59 (L) >60 mL/min   GFR calc Af Amer >60 >60 mL/min   Anion gap 13 5 - 15    Comment: Performed at [facility], [place] [address], [place],  [place] [address]  CBC     Status: Abnormal   Collection Time: 12/25/17  2:38 AM  Result Value Ref Range   WBC 20.3 (H) 4.0 - 10.5 K/uL   RBC 3.81 (L) 4.22 - 5.81 MIL/uL   Hemoglobin 9.6 (L) 13.0 - 17.0 g/dL   HCT 30.4 (L) 39.0 - 52.0 %   MCV 79.8 (L) 80.0 - 100.0 fL   MCH 25.2 (L) 26.0 - 34.0 pg   MCHC 31.6 30.0 - 36.0 g/dL   RDW 18.9 (H) 11.5 - 15.5 %   Platelets 402 (H) 150 - 400 K/uL   nRBC 0.0 0.0 - 0.2 %    Comment: Performed at [facility] [address] [place], [place] [address]  CBC with Differential/Platelet     Status: Abnormal   Collection Time: 12/26/17  3:40 AM  Result Value Ref Range   WBC 22.0 (H) 4.0 - 10.5 K/uL   RBC 4.05 (L) 4.22 - 5.81 MIL/uL   Hemoglobin 9.9 (L) 13.0 - 17.0 g/dL   HCT 31.7 (L) 39.0 - 52.0 %   MCV 78.3 (L) 80.0 - 100.0 fL   MCH 24.4 (L) 26.0 - 34.0 pg   MCHC 31.2 30.0 - 36.0 g/dL   RDW 19.1 (H) 11.5 - 15.5 %   Platelets 409 (H) 150 - 400 K/uL   nRBC 0.0 0.0 - 0.2 %   Neutrophils Relative % 82 %   Neutro Abs 18.3 (H) 1.7 - 7.7 K/uL   Lymphocytes Relative 6 %   Lymphs Abs 1.3 0.7 - 4.0 K/uL   Monocytes Relative 9 %   Monocytes Absolute 1.9 (H) 0.1 - 1.0 K/uL   Eosinophils Relative 1 %   Eosinophils Absolute 0.2 0.0 - 0.5 K/uL   Basophils Relative 1 %   Basophils Absolute 0.1 0.0 - 0.1  K/uL   Immature Granulocytes 1 %   Abs Immature Granulocytes 0.18 (H) 0.00 - 0.07 K/uL    Comment: Performed at [facility], [place] [address] [place], [place] [address]  Basic metabolic panel     Status: Abnormal   Collection Time: 12/26/17  3:40 AM  Result Value Ref Range   Sodium 134 (L) 135 - 145 mmol/L   Potassium 3.3 (L) 3.5 - 5.1 mmol/L   Chloride 102 98 - 111 mmol/L   CO2 19 (L) 22 - 32 mmol/L   Glucose, Bld 126 (H) 70 - 99 mg/dL   BUN 6 (L) 8 - 23 mg/dL   Creatinine, Ser 1.23 0.61 - 1.24 mg/dL   Calcium 7.6 (L) 8.9 - 10.3 mg/dL   GFR calc non Af Amer 58 (L) >60 mL/min   GFR calc Af Amer >60 >60 mL/min   Anion gap 13 5 - 15     Comment: Performed at [facility] [address], [place], [place] [address]  Protime-INR     Status: Abnormal   Collection Time: 12/26/17  3:40 AM  Result Value Ref Range   Prothrombin Time 19.9 (H) 11.4 - 15.2 seconds   INR 1.72     Comment: Performed at [facility] [address] [place], [place] [address]  Protime-INR     Status: Abnormal   Collection Time: 12/27/17  3:28 AM  Result Value Ref Range   Prothrombin Time 17.3 (H) 11.4 - 15.2 seconds   INR 1.43     Comment: Performed at [facility] [address] [place], [place] [address]  CBC with Differential/Platelet     Status: Abnormal   Collection Time: 12/27/17  3:28 AM  Result Value Ref Range   WBC 18.8 (H) 4.0 - 10.5 K/uL   RBC 3.87 (L) 4.22 - 5.81 MIL/uL   Hemoglobin 9.5 (L) 13.0 - 17.0 g/dL   HCT 30.4 (L) 39.0 - 52.0 %   MCV 78.6 (L) 80.0 - 100.0 fL   MCH 24.5 (L) 26.0 - 34.0 pg   MCHC 31.3 30.0 - 36.0 g/dL   RDW 19.4 (H) 11.5 - 15.5 %   Platelets 398 150 - 400 K/uL   nRBC 0.0 0.0 - 0.2 %   Neutrophils Relative % 81 %   Neutro Abs 15.4 (H) 1.7 - 7.7 K/uL   Lymphocytes Relative 6 %   Lymphs Abs 1.2 0.7 - 4.0 K/uL   Monocytes Relative 9 %   Monocytes Absolute 1.6 (H) 0.1 - 1.0 K/uL   Eosinophils Relative 2 %   Eosinophils Absolute 0.3 0.0 - 0.5 K/uL   Basophils Relative 1 %   Basophils Absolute 0.1 0.0 - 0.1 K/uL   Immature Granulocytes 1 %   Abs Immature Granulocytes 0.20 (H) 0.00 - 0.07 K/uL    Comment: Performed at [facility] [address], [place], [place] [address]  Basic metabolic panel     Status: Abnormal   Collection Time: 12/27/17  3:28 AM  Result Value Ref Range   Sodium 135 135 - 145 mmol/L   Potassium 3.4 (L) 3.5 - 5.1 mmol/L   Chloride 104 98 - 111 mmol/L   CO2 19 (L) 22 - 32 mmol/L   Glucose, Bld 107 (H) 70 - 99 mg/dL   BUN 6 (L) 8 - 23 mg/dL   Creatinine, Ser 0.94 0.61 - 1.24 mg/dL   Calcium 7.5 (L) 8.9 - 10.3 mg/dL  GFR calc non Af Amer >60 >60 mL/min    GFR calc Af Amer >60 >60 mL/min   Anion gap 12 5 - 15    Comment: Performed at [facility] [address], [place], [place] [address]  Aerobic/Anaerobic Culture (surgical/deep wound)     Status: None   Collection Time: 12/27/17  2:50 PM  Result Value Ref Range   Specimen Description ABSCESS ABDOMEN    Special Requests NONE    Gram Stain      ABUNDANT WBC PRESENT, PREDOMINANTLY PMN FEW GRAM POSITIVE RODS RARE GRAM POSITIVE COCCI RARE GRAM VARIABLE ROD    Culture      FEW ESCHERICHIA COLI NO ANAEROBES ISOLATED Performed at [facility], [place] [address] [place], [place] [address]    Report Status 01/01/2018 FINAL    Organism ID, Bacteria ESCHERICHIA COLI       Susceptibility   Escherichia coli - MIC*    AMPICILLIN >=32 RESISTANT Resistant     CEFAZOLIN <=4 SENSITIVE Sensitive     CEFEPIME <=1 SENSITIVE Sensitive     CEFTAZIDIME <=1 SENSITIVE Sensitive     CEFTRIAXONE <=1 SENSITIVE Sensitive     CIPROFLOXACIN 1 SENSITIVE Sensitive     GENTAMICIN <=1 SENSITIVE Sensitive     IMIPENEM <=0.25 SENSITIVE Sensitive     TRIMETH/SULFA >=320 RESISTANT Resistant     AMPICILLIN/SULBACTAM >=32 RESISTANT Resistant     PIP/TAZO <=4 SENSITIVE Sensitive     * FEW ESCHERICHIA COLI  Protime-INR     Status: Abnormal   Collection Time: 12/28/17  3:04 AM  Result Value Ref Range   Prothrombin Time 17.0 (H) 11.4 - 15.2 seconds   INR 1.40     Comment: Performed at [facility] [address] [place], [place] [address]  CBC with Differential/Platelet     Status: Abnormal   Collection Time: 12/28/17  3:04 AM  Result Value Ref Range   WBC 15.5 (H) 4.0 - 10.5 K/uL   RBC 3.77 (L) 4.22 - 5.81 MIL/uL   Hemoglobin 9.2 (L) 13.0 - 17.0 g/dL   HCT 29.5 (L) 39.0 - 52.0 %   MCV 78.2 (L) 80.0 - 100.0 fL   MCH 24.4 (L) 26.0 - 34.0 pg   MCHC 31.2 30.0 - 36.0 g/dL   RDW 19.5 (H) 11.5 - 15.5 %   Platelets 471 (H) 150 - 400 K/uL   nRBC 0.0 0.0 - 0.2 %   Neutrophils Relative % 81 %    Neutro Abs 12.6 (H) 1.7 - 7.7 K/uL   Lymphocytes Relative 7 %   Lymphs Abs 1.1 0.7 - 4.0 K/uL   Monocytes Relative 7 %   Monocytes Absolute 1.2 (H) 0.1 - 1.0 K/uL   Eosinophils Relative 3 %   Eosinophils Absolute 0.5 0.0 - 0.5 K/uL   Basophils Relative 1 %   Basophils Absolute 0.1 0.0 - 0.1 K/uL   Immature Granulocytes 1 %   Abs Immature Granulocytes 0.12 (H) 0.00 - 0.07 K/uL    Comment: Performed at [facility], [place] [address], [place], [place] [address]  Basic metabolic panel     Status: Abnormal   Collection Time: 12/28/17  3:04 AM  Result Value Ref Range   Sodium 136 135 - 145 mmol/L   Potassium 4.0 3.5 - 5.1 mmol/L   Chloride 104 98 - 111 mmol/L   CO2 23 22 - 32 mmol/L   Glucose, Bld 114 (H) 70 - 99 mg/dL  BUN 5 (L) 8 - 23 mg/dL   Creatinine, Ser 0.48 (L) 0.61 - 1.24 mg/dL   Calcium 7.5 (L) 8.9 - 10.3 mg/dL   GFR calc non Af Amer >60 >60 mL/min   GFR calc Af Amer >60 >60 mL/min   Anion gap 9 5 - 15    Comment: Performed at [facility] [address] [place], [place] [address]  Basic metabolic panel     Status: Abnormal   Collection Time: 01/07/18 11:18 AM  Result Value Ref Range   Sodium 135 135 - 145 mEq/L   Potassium 4.3 3.5 - 5.1 mEq/L   Chloride 99 96 - 112 mEq/L   CO2 25 19 - 32 mEq/L   Glucose, Bld 162 (H) 70 - 99 mg/dL   BUN 18 6 - 23 mg/dL   Creatinine, Ser 1.03 0.40 - 1.50 mg/dL   Calcium 9.3 8.4 - 10.5 mg/dL   GFR 75.23 >60.00 mL/min  CBC with Differential/Platelet     Status: Abnormal   Collection Time: 01/07/18 11:18 AM  Result Value Ref Range   WBC 13.1 (H) 4.0 - 10.5 K/uL   RBC 4.78 4.22 - 5.81 Mil/uL   Hemoglobin 12.2 (L) 13.0 - 17.0 g/dL   HCT 38.7 (L) 39.0 - 52.0 %   MCV 81.0 78.0 - 100.0 fl   MCHC 31.6 30.0 - 36.0 g/dL   RDW 21.6 (H) 11.5 - 15.5 %   Platelets 735.0 Repeated and verified X2. (H) 150.0 - 400.0 K/uL   Neutrophils Relative % 70.6 43.0 - 77.0 %   Lymphocytes Relative 15.0 12.0 - 46.0 %   Monocytes Relative 9.8 3.0  - 12.0 %   Eosinophils Relative 2.6 0.0 - 5.0 %   Basophils Relative 2.0 0.0 - 3.0 %   Neutro Abs 9.3 (H) 1.4 - 7.7 K/uL   Lymphs Abs 2.0 0.7 - 4.0 K/uL   Monocytes Absolute 1.3 (H) 0.1 - 1.0 K/uL   Eosinophils Absolute 0.3 0.0 - 0.7 K/uL   Basophils Absolute 0.3 (H) 0.0 - 0.1 K/uL  CBC with Differential/Platelet     Status: Abnormal   Collection Time: 01/12/18  1:20 PM  Result Value Ref Range   WBC 17.1 (H) 4.0 - 10.5 K/uL   RBC 4.65 4.22 - 5.81 Mil/uL   Hemoglobin 12.0 (L) 13.0 - 17.0 g/dL   HCT 37.6 (L) 39.0 - 52.0 %   MCV 80.9 78.0 - 100.0 fl   MCHC 32.1 30.0 - 36.0 g/dL   RDW 21.3 (H) 11.5 - 15.5 %   Platelets 513.0 (H) 150.0 - 400.0 K/uL   Neutrophils Relative % 77.1 (H) 43.0 - 77.0 %   Lymphocytes Relative 9.2 (L) 12.0 - 46.0 %   Monocytes Relative 11.2 3.0 - 12.0 %   Eosinophils Relative 1.1 0.0 - 5.0 %   Basophils Relative 1.4 0.0 - 3.0 %   Neutro Abs 13.2 (H) 1.4 - 7.7 K/uL   Lymphs Abs 1.6 0.7 - 4.0 K/uL   Monocytes Absolute 1.9 (H) 0.1 - 1.0 K/uL   Eosinophils Absolute 0.2 0.0 - 0.7 K/uL   Basophils Absolute 0.2 (H) 0.0 - 0.1 K/uL  CBC with Differential/Platelet     Status: Abnormal   Collection Time: 01/14/18 10:56 AM  Result Value Ref Range   WBC 16.7 (H) 4.0 - 10.5 K/uL   RBC 4.61 4.22 - 5.81 Mil/uL   Hemoglobin 12.0 (L) 13.0 - 17.0 g/dL   HCT 37.4 (L) 39.0 - 52.0 %  MCV 81.2 78.0 - 100.0 fl   MCHC 32.1 30.0 - 36.0 g/dL   RDW 21.4 (H) 11.5 - 15.5 %   Platelets 442.0 (H) 150.0 - 400.0 K/uL   Neutrophils Relative % 76.5 43.0 - 77.0 %   Lymphocytes Relative 10.0 (L) 12.0 - 46.0 %   Monocytes Relative 11.1 3.0 - 12.0 %   Eosinophils Relative 1.2 0.0 - 5.0 %   Basophils Relative 1.2 0.0 - 3.0 %   Neutro Abs 12.8 (H) 1.4 - 7.7 K/uL   Lymphs Abs 1.7 0.7 - 4.0 K/uL   Monocytes Absolute 1.9 (H) 0.1 - 1.0 K/uL   Eosinophils Absolute 0.2 0.0 - 0.7 K/uL   Basophils Absolute 0.2 (H) 0.0 - 0.1 K/uL  Lipase, blood     Status: None   Collection Time: 01/18/18 10:27  AM  Result Value Ref Range   Lipase 27 11 - 51 U/L    Comment: Performed at [facility], [place] [address] [place], [place] [address]  Amylase     Status: None   Collection Time: 01/18/18 10:27 AM  Result Value Ref Range   Amylase 44 28 - 100 U/L    Comment: Performed at [facility], [place] [address] [place], [place] [address]  CMP ([place] only)     Status: Abnormal   Collection Time: 01/18/18 10:27 AM  Result Value Ref Range   Sodium 134 (L) 135 - 145 mmol/L   Potassium 4.0 3.5 - 5.1 mmol/L   Chloride 100 98 - 111 mmol/L   CO2 24 22 - 32 mmol/L   Glucose, Bld 159 (H) 70 - 99 mg/dL   BUN 12 8 - 23 mg/dL   Creatinine 1.07 0.61 - 1.24 mg/dL   Calcium 9.4 8.9 - 10.3 mg/dL   Total Protein 7.7 6.5 - 8.1 g/dL   Albumin 3.1 (L) 3.5 - 5.0 g/dL   AST 29 15 - 41 U/L   ALT 23 0 - 44 U/L   Alkaline Phosphatase 105 38 - 126 U/L   Total Bilirubin 0.4 0.3 - 1.2 mg/dL   GFR, Est Non Af Am >60 >60 mL/min   GFR, Est AFR Am >60 >60 mL/min   Anion gap 10 5 - 15    Comment: Performed at [facility], [address], [place], [place] [address]  CBC with Differential ([place] Only)     Status: Abnormal   Collection Time: 01/18/18 10:27 AM  Result Value Ref Range   WBC Count 23.7 (H) 4.0 - 10.5 K/uL   RBC 4.60 4.22 - 5.81 MIL/uL   Hemoglobin 11.8 (L) 13.0 - 17.0 g/dL   HCT 37.2 (L) 39.0 - 52.0 %   MCV 80.9 80.0 - 100.0 fL   MCH 25.7 (L) 26.0 - 34.0 pg   MCHC 31.7 30.0 - 36.0 g/dL   RDW 20.0 (H) 11.5 - 15.5 %   Platelet Count 281 150 - 400 K/uL   nRBC 0.0 0.0 - 0.2 %   Neutrophils Relative % 89 %   Neutro Abs 21.3 (H) 1.7 - 7.7 K/uL   Lymphocytes Relative 4 %   Lymphs Abs 0.9 0.7 - 4.0 K/uL   Monocytes Relative 5 %   Monocytes Absolute 1.1 (H) 0.1 - 1.0 K/uL   Eosinophils Relative 0 %   Eosinophils Absolute 0.1 0.0 - 0.5 K/uL   Basophils Relative 1 %   Basophils Absolute 0.1 0.0 - 0.1 K/uL   Immature Granulocytes  1  %   Abs Immature Granulocytes 0.18 (H) 0.00 - 0.07 K/uL    Comment: Performed at [facility], [place] [address], [place], [place] [address]  Urine culture     Status: Abnormal   Collection Time: 01/20/18  3:52 PM  Result Value Ref Range   Specimen Description      URINE, CLEAN CATCH Performed at [facility], [place] [address], [place], [place] [address]    Special Requests      NONE Performed at [facility], [place] [address] [place], [place] [address]    Culture (A)     <10,000 COLONIES/mL INSIGNIFICANT GROWTH Performed at [facility] [address], [place], [place] [address]    Report Status 01/22/2018 FINAL   Urinalysis, Routine w reflex microscopic     Status: Abnormal   Collection Time: 01/20/18  3:52 PM  Result Value Ref Range   Color, Urine YELLOW YELLOW   APPearance CLEAR CLEAR   Specific Gravity, Urine 1.012 1.005 - 1.030   pH 6.0 5.0 - 8.0   Glucose, UA NEGATIVE NEGATIVE mg/dL   Hgb urine dipstick NEGATIVE NEGATIVE   Bilirubin Urine NEGATIVE NEGATIVE   Ketones, ur 5 (A) NEGATIVE mg/dL   Protein, ur NEGATIVE NEGATIVE mg/dL   Nitrite NEGATIVE NEGATIVE   Leukocytes, UA NEGATIVE NEGATIVE    Comment: Performed at [facility], [place] [address], [place], [place] [address]  Comprehensive metabolic panel     Status: Abnormal   Collection Time: 01/20/18  4:17 PM  Result Value Ref Range   Sodium 133 (L) 135 - 145 mmol/L   Potassium 3.6 3.5 - 5.1 mmol/L   Chloride 99 98 - 111 mmol/L   CO2 24 22 - 32 mmol/L   Glucose, Bld 119 (H) 70 - 99 mg/dL   BUN 11 8 - 23 mg/dL   Creatinine, Ser 1.02 0.61 - 1.24 mg/dL   Calcium 8.4 (L) 8.9 - 10.3 mg/dL   Total Protein 7.4 6.5 - 8.1 g/dL   Albumin 3.2 (L) 3.5 - 5.0 g/dL   AST 18 15 - 41 U/L   ALT 15 0 - 44 U/L   Alkaline Phosphatase 75 38 - 126 U/L   Total Bilirubin 0.5 0.3 - 1.2 mg/dL   GFR calc non Af Amer >60 >60 mL/min   GFR calc Af Amer >60  >60 mL/min   Anion gap 10 5 - 15    Comment: Performed at [facility], [place] [address] [place], [place] [address]  Culture, blood (routine x 2)     Status: None   Collection Time: 01/20/18  4:17 PM  Result Value Ref Range   Specimen Description      BLOOD LEFT ARM Performed at [facility] [address], [place], [place] [address]    Special Requests      BOTTLES DRAWN AEROBIC AND ANAEROBIC Blood Culture adequate volume Performed at [facility] [address] [place], [place] [address]    Culture      NO GROWTH 5 DAYS Performed at [facility], [place] [address] [place], [place] [address]    Report Status 01/25/2018 FINAL   Culture, blood (routine x 2)     Status: None   Collection Time: 01/20/18  4:17 PM  Result Value Ref Range   Specimen Description      BLOOD LEFT ARM Performed at [facility],  [place] [address] [place], [place] [id number]    Special Requests      BOTTLES DRAWN AEROBIC AND ANAEROBIC Blood Culture adequate volume Performed at [facility] [address], [place], [place] [address]    Culture      NO GROWTH 5 DAYS Performed at [facility], [place] [address], [place], [place] [address]    Report Status 01/25/2018 FINAL   Comprehensive metabolic panel     Status: Abnormal   Collection Time: 01/21/18  4:08 AM  Result Value Ref Range   Sodium 133 (L) 135 - 145 mmol/L   Potassium 3.5 3.5 - 5.1 mmol/L   Chloride 102 98 - 111 mmol/L   CO2 19 (L) 22 - 32 mmol/L   Glucose, Bld 127 (H) 70 - 99 mg/dL   BUN 10 8 - 23 mg/dL   Creatinine, Ser 0.92 0.61 - 1.24 mg/dL   Calcium 7.8 (L) 8.9 - 10.3 mg/dL   Total Protein 6.4 (L) 6.5 - 8.1 g/dL   Albumin 2.7 (L) 3.5 - 5.0 g/dL   AST 13 (L) 15 - 41 U/L   ALT 11 0 - 44 U/L   Alkaline Phosphatase 60 38 - 126 U/L   Total Bilirubin 0.7 0.3 - 1.2 mg/dL   GFR calc non Af Amer >60 >60 mL/min   GFR calc Af Amer >60 >60  mL/min   Anion gap 12 5 - 15    Comment: Performed at [facility], [place] [address], [place], [place] [address]  CBC     Status: Abnormal   Collection Time: 01/21/18  4:08 AM  Result Value Ref Range   WBC 15.8 (H) 4.0 - 10.5 K/uL   RBC 3.86 (L) 4.22 - 5.81 MIL/uL   Hemoglobin 10.0 (L) 13.0 - 17.0 g/dL   HCT 32.2 (L) 39.0 - 52.0 %   MCV 83.4 80.0 - 100.0 fL   MCH 25.9 (L) 26.0 - 34.0 pg   MCHC 31.1 30.0 - 36.0 g/dL   RDW 19.8 (H) 11.5 - 15.5 %   Platelets 246 150 - 400 K/uL   nRBC 0.0 0.0 - 0.2 %    Comment: Performed at [facility], [place] [address], [place], [place] [address]  Protime-INR     Status: Abnormal   Collection Time: 01/21/18  4:08 AM  Result Value Ref Range   Prothrombin Time 18.5 (H) 11.4 - 15.2 seconds   INR 1.56     Comment: Performed at [facility], [place] [address], [place], [place] [address]  CBC with Differential/Platelet     Status: Abnormal   Collection Time: 01/22/18  4:45 AM  Result Value Ref Range   WBC 15.4 (H) 4.0 - 10.5 K/uL   RBC 3.99 (L) 4.22 - 5.81 MIL/uL   Hemoglobin 10.0 (L) 13.0 - 17.0 g/dL   HCT 32.7 (L) 39.0 - 52.0 %   MCV 82.0 80.0 - 100.0 fL   MCH 25.1 (L) 26.0 - 34.0 pg   MCHC 30.6 30.0 - 36.0 g/dL   RDW 19.5 (H) 11.5 - 15.5 %   Platelets 298 150 - 400 K/uL   nRBC 0.0 0.0 - 0.2 %   Neutrophils Relative % 75 %   Neutro Abs 11.5 (H) 1.7 - 7.7 K/uL   Lymphocytes Relative 9 %   Lymphs Abs 1.4 0.7 - 4.0 K/uL   Monocytes Relative 11 %   Monocytes Absolute 1.8 (H) 0.1 - 1.0 K/uL  Eosinophils Relative 3 %   Eosinophils Absolute 0.5 0.0 - 0.5 K/uL   Basophils Relative 1 %   Basophils Absolute 0.1 0.0 - 0.1 K/uL   Immature Granulocytes 1 %   Abs Immature Granulocytes 0.12 (H) 0.00 - 0.07 K/uL    Comment: Performed at [facility], [place] [address], [place], [place] [address]  Comprehensive metabolic panel     Status: Abnormal   Collection Time: 01/22/18  4:45 AM   Result Value Ref Range   Sodium 136 135 - 145 mmol/L   Potassium 3.5 3.5 - 5.1 mmol/L   Chloride 106 98 - 111 mmol/L   CO2 20 (L) 22 - 32 mmol/L   Glucose, Bld 119 (H) 70 - 99 mg/dL   BUN 8 8 - 23 mg/dL   Creatinine, Ser 0.93 0.61 - 1.24 mg/dL   Calcium 7.8 (L) 8.9 - 10.3 mg/dL   Total Protein 6.2 (L) 6.5 - 8.1 g/dL   Albumin 2.6 (L) 3.5 - 5.0 g/dL   AST 11 (L) 15 - 41 U/L   ALT 11 0 - 44 U/L   Alkaline Phosphatase 55 38 - 126 U/L   Total Bilirubin 0.7 0.3 - 1.2 mg/dL   GFR calc non Af Amer >60 >60 mL/min   GFR calc Af Amer >60 >60 mL/min   Anion gap 10 5 - 15    Comment: Performed at [facility], [place] [address], [place], [place] [address]  CBC with Differential/Platelet     Status: Abnormal   Collection Time: 01/23/18  4:11 AM  Result Value Ref Range   WBC 15.9 (H) 4.0 - 10.5 K/uL   RBC 3.93 (L) 4.22 - 5.81 MIL/uL   Hemoglobin 10.1 (L) 13.0 - 17.0 g/dL   HCT 33.4 (L) 39.0 - 52.0 %   MCV 85.0 80.0 - 100.0 fL   MCH 25.7 (L) 26.0 - 34.0 pg   MCHC 30.2 30.0 - 36.0 g/dL   RDW 20.1 (H) 11.5 - 15.5 %   Platelets 326 150 - 400 K/uL   nRBC 0.0 0.0 - 0.2 %   Neutrophils Relative % 73 %   Neutro Abs 11.6 (H) 1.7 - 7.7 K/uL   Lymphocytes Relative 11 %   Lymphs Abs 1.8 0.7 - 4.0 K/uL   Monocytes Relative 10 %   Monocytes Absolute 1.6 (H) 0.1 - 1.0 K/uL   Eosinophils Relative 4 %   Eosinophils Absolute 0.6 (H) 0.0 - 0.5 K/uL   Basophils Relative 1 %   Basophils Absolute 0.1 0.0 - 0.1 K/uL   Immature Granulocytes 1 %   Abs Immature Granulocytes 0.18 (H) 0.00 - 0.07 K/uL    Comment: Performed at [facility], [place] [address], [place], [place] [address]  Comprehensive metabolic panel     Status: Abnormal   Collection Time: 01/23/18  4:11 AM  Result Value Ref Range   Sodium 137 135 - 145 mmol/L   Potassium 3.4 (L) 3.5 - 5.1 mmol/L   Chloride 110 98 - 111 mmol/L   CO2 17 (L) 22 - 32 mmol/L   Glucose, Bld 124 (H) 70 - 99 mg/dL   BUN 9 8 - 23  mg/dL   Creatinine, Ser 0.75 0.61 - 1.24 mg/dL   Calcium 7.9 (L) 8.9 - 10.3 mg/dL   Total Protein 6.2 (L) 6.5 - 8.1 g/dL   Albumin 2.4 (L) 3.5 - 5.0 g/dL   AST 13 (L) 15 - 41 U/L   ALT  8 0 - 44 U/L   Alkaline Phosphatase 53 38 - 126 U/L   Total Bilirubin 0.4 0.3 - 1.2 mg/dL   GFR calc non Af Amer >60 >60 mL/min   GFR calc Af Amer >60 >60 mL/min   Anion gap 10 5 - 15    Comment: Performed at [facility], [place] [address], [place], [place] [address]  CBC with Differential/Platelet     Status: Abnormal   Collection Time: 01/24/18  4:30 AM  Result Value Ref Range   WBC 15.6 (H) 4.0 - 10.5 K/uL   RBC 4.19 (L) 4.22 - 5.81 MIL/uL   Hemoglobin 10.6 (L) 13.0 - 17.0 g/dL   HCT 33.9 (L) 39.0 - 52.0 %   MCV 80.9 80.0 - 100.0 fL   MCH 25.3 (L) 26.0 - 34.0 pg   MCHC 31.3 30.0 - 36.0 g/dL   RDW 19.9 (H) 11.5 - 15.5 %   Platelets 451 (H) 150 - 400 K/uL   nRBC 0.0 0.0 - 0.2 %   Neutrophils Relative % 72 %   Neutro Abs 11.1 (H) 1.7 - 7.7 K/uL   Lymphocytes Relative 13 %   Lymphs Abs 2.0 0.7 - 4.0 K/uL   Monocytes Relative 9 %   Monocytes Absolute 1.5 (H) 0.1 - 1.0 K/uL   Eosinophils Relative 4 %   Eosinophils Absolute 0.7 (H) 0.0 - 0.5 K/uL   Basophils Relative 1 %   Basophils Absolute 0.2 (H) 0.0 - 0.1 K/uL   Immature Granulocytes 1 %   Abs Immature Granulocytes 0.16 (H) 0.00 - 0.07 K/uL    Comment: Performed at [facility], [place] [address], [place], [place] [address]  CBC and differential     Status: Abnormal   Collection Time: 01/31/18 12:00 AM  Result Value Ref Range   Hemoglobin 11.3 (A) 13.5 - 17.5   HCT 36 (A) 41 - 53   Neutrophils Absolute 11    Platelets 562 (A) 150 - 399   WBC 09.8   Basic metabolic panel     Status: Abnormal   Collection Time: 01/31/18 12:00 AM  Result Value Ref Range   Glucose 213    BUN 13 4 - 21   Creatinine 0.9 0.6 - 1.3   Potassium 4.6 3.4 - 5.3   Sodium 135 (A) 137 - 147  CBC and differential     Status:  Abnormal   Collection Time: 02/14/18 12:00 AM  Result Value Ref Range   Hemoglobin 12.0 (A) 13.5 - 17.5   HCT 36 (A) 41 - 53   Neutrophils Absolute 9    Platelets 331 150 - 399   WBC 11.9   Basic metabolic panel     Status: None   Collection Time: 02/14/18 12:00 AM  Result Value Ref Range   Glucose 207    BUN 13 4 - 21   Creatinine 0.7 0.6 - 1.3   Potassium 4.3 3.4 - 5.3   Sodium 137 137 - 147    Assessment/Plan: 1. Herpes zoster without complication Start Valtrex TID x 7 days. Supportive measures and OTC medications reviewed. Discussed route of transmission to those without immunity to varicella and immunocompromised individuals. He will call if he notes any worsening of pain not controlled with OTC analgesics. Follow-up PRN.   Leeanne Rio, PA-C

## 2018-03-21 NOTE — Patient Instructions (Signed)
Please keep the area covered. Start an over-the-counter Lidocaine ointment to the area. Tylenol for mild pain. Start the Valtrex and take as directed.  Avoid being around any elderly, young or pregnant women until any blistering has scabbed over.  Call me for any increase in pain and we will send something in.   Shingles  Shingles is an infection. It gives you a painful skin rash and blisters that have fluid in them. Shingles is caused by the same germ (virus) that causes chickenpox. Shingles only happens in people who:  Have had chickenpox.  Have been given a shot of medicine (vaccine) to protect against chickenpox. Shingles is rare in this group. The first symptoms of shingles may be itching, tingling, or pain in an area on your skin. A rash will show on your skin a few days or weeks later. The rash is likely to be on one side of your body. The rash usually has a shape like a belt or a band. Over time, the rash turns into fluid-filled blisters. The blisters will break open, change into scabs, and dry up. Medicines may:  Help with pain and itching.  Help you get better sooner.  Help to prevent long-term problems. Follow these instructions at home: Medicines  Take over-the-counter and prescription medicines only as told by your doctor.  Put on an anti-itch cream or numbing cream where you have a rash, blisters, or scabs. Do this as told by your doctor. Helping with itching and discomfort   Put cold, wet cloths (cold compresses) on the area of the rash or blisters as told by your doctor.  Cool baths can help you feel better. Try adding baking soda or dry oatmeal to the water to lessen itching. Do not bathe in hot water. Blister and rash care  Keep your rash covered with a loose bandage (dressing).  Wear loose clothing that does not rub on your rash.  Keep your rash and blisters clean. To do this, wash the area with mild soap and cool water as told by your doctor.  Check  your rash every day for signs of infection. Check for: ? More redness, swelling, or pain. ? Fluid or blood. ? Warmth. ? Pus or a bad smell.  Do not scratch your rash. Do not pick at your blisters. To help you to not scratch: ? Keep your fingernails clean and cut short. ? Wear gloves or mittens when you sleep, if scratching is a problem. General instructions  Rest as told by your doctor.  Keep all follow-up visits as told by your doctor. This is important.  Wash your hands often with soap and water. If soap and water are not available, use hand sanitizer. Doing this lowers your chance of getting a skin infection caused by germs (bacteria).  Your infection can cause chickenpox in people who have never had chickenpox or never got a shot of chickenpox vaccine. If you have blisters that did not change into scabs yet, try not to touch other people or be around other people, especially: ? Babies. ? Pregnant women. ? Children who have areas of red, itchy, or rough skin (eczema). ? Very old people who have transplants. ? People who have a long-term (chronic) sickness, like cancer or AIDS. Contact a doctor if:  Your pain does not get better with medicine.  Your pain does not get better after the rash heals.  You have any signs of infection in the rash area. These signs include: ? More redness, swelling,  or pain around the rash. ? Fluid or blood coming from the rash. ? The rash area feeling warm to the touch. ? Pus or a bad smell coming from the rash. Get help right away if:  The rash is on your face or nose.  You have pain in your face or pain by your eye.  You lose feeling on one side of your face.  You have trouble seeing.  You have ear pain, or you have ringing in your ear.  You have a loss of taste.  Your condition gets worse. Summary  Shingles gives you a painful skin rash and blisters that have fluid in them.  Shingles is an infection. It is caused by the same germ  (virus) that causes chickenpox.  Keep your rash covered with a loose bandage (dressing). Wear loose clothing that does not rub on your rash.  If you have blisters that did not change into scabs yet, try not to touch other people or be around people. This information is not intended to replace advice given to you by your health care provider. Make sure you discuss any questions you have with your health care provider. Document Released: 06/10/2007 Document Revised: 08/26/2016 Document Reviewed: 08/26/2016 Elsevier Interactive Patient Education  2019 [employer].

## 2018-03-22 ENCOUNTER — Other Ambulatory Visit: Payer: Self-pay | Admitting: Cardiovascular Disease

## 2018-03-22 MED ORDER — RIVAROXABAN 20 MG PO TABS: 20.0000 mg | ORAL_TABLET | Freq: Every day | ORAL | 4 refills | Status: DC

## 2018-03-22 NOTE — Telephone Encounter (Signed)
°*  STAT* If patient is at the pharmacy, call can be transferred to refill team.   1. Which medications need to be refilled? (please list name of each medication and dose if known)-new Prescription for Xarelto- please let him know when you call this in please- he needs it today  2. Which pharmacy/location (including street and city if local pharmacy) is medication to be sent to-[facility] [place], [place] [address]. Do they need a 30 day or 90 day supply? 30 and refills

## 2018-04-27 ENCOUNTER — Other Ambulatory Visit: Payer: Self-pay

## 2018-04-27 ENCOUNTER — Inpatient Hospital Stay: Payer: Medicare Other | Attending: Oncology

## 2018-04-27 ENCOUNTER — Ambulatory Visit (HOSPITAL_COMMUNITY): Admission: RE | Admit: 2018-04-27 | Payer: Medicare Other | Source: Ambulatory Visit

## 2018-04-27 ENCOUNTER — Other Ambulatory Visit: Payer: Medicare Other

## 2018-04-27 ENCOUNTER — Ambulatory Visit (HOSPITAL_COMMUNITY)
Admission: RE | Admit: 2018-04-27 | Discharge: 2018-04-27 | Disposition: A | Discharge status: Home / Self Care | Payer: Medicare Other | Source: Ambulatory Visit | Attending: Oncology | Admitting: Oncology

## 2018-04-27 ENCOUNTER — Encounter (HOSPITAL_COMMUNITY): Payer: Self-pay

## 2018-04-27 DIAGNOSIS — C7889 Secondary malignant neoplasm of other digestive organs: Secondary | ICD-10-CM | POA: Insufficient documentation

## 2018-04-27 DIAGNOSIS — C19 Malignant neoplasm of rectosigmoid junction: Secondary | ICD-10-CM | POA: Diagnosis not present

## 2018-04-27 DIAGNOSIS — K6389 Other specified diseases of intestine: Secondary | ICD-10-CM | POA: Diagnosis not present

## 2018-04-27 DIAGNOSIS — I10 Essential (primary) hypertension: Secondary | ICD-10-CM | POA: Insufficient documentation

## 2018-04-27 DIAGNOSIS — C184 Malignant neoplasm of transverse colon: Secondary | ICD-10-CM | POA: Insufficient documentation

## 2018-04-27 LAB — CBC WITH DIFFERENTIAL (CANCER CENTER ONLY)
Abs Immature Granulocytes: 0.09 10*3/uL — ABNORMAL HIGH (ref 0.00–0.07)
Basophils Absolute: 0.2 10*3/uL — ABNORMAL HIGH (ref 0.0–0.1)
Basophils Relative: 1 %
Eosinophils Absolute: 0.3 10*3/uL (ref 0.0–0.5)
Eosinophils Relative: 2 %
HCT: 33.7 % — ABNORMAL LOW (ref 39.0–52.0)
Hemoglobin: 10.5 g/dL — ABNORMAL LOW (ref 13.0–17.0)
Immature Granulocytes: 1 %
Lymphocytes Relative: 12 %
Lymphs Abs: 1.6 10*3/uL (ref 0.7–4.0)
MCH: 26.6 pg (ref 26.0–34.0)
MCHC: 31.2 g/dL (ref 30.0–36.0)
MCV: 85.5 fL (ref 80.0–100.0)
Monocytes Absolute: 1.2 10*3/uL — ABNORMAL HIGH (ref 0.1–1.0)
Monocytes Relative: 9 %
Neutro Abs: 10.6 10*3/uL — ABNORMAL HIGH (ref 1.7–7.7)
Neutrophils Relative %: 75 %
Platelet Count: 454 10*3/uL — ABNORMAL HIGH (ref 150–400)
RBC: 3.94 MIL/uL — ABNORMAL LOW (ref 4.22–5.81)
RDW: 15.9 % — ABNORMAL HIGH (ref 11.5–15.5)
WBC Count: 14 10*3/uL — ABNORMAL HIGH (ref 4.0–10.5)
nRBC: 0 % (ref 0.0–0.2)

## 2018-04-27 LAB — CMP (CANCER CENTER ONLY)
ALT: 10 U/L (ref 0–44)
AST: 17 U/L (ref 15–41)
Albumin: 3.3 g/dL — ABNORMAL LOW (ref 3.5–5.0)
Alkaline Phosphatase: 74 U/L (ref 38–126)
Anion gap: 12 (ref 5–15)
BUN: 11 mg/dL (ref 8–23)
CO2: 19 mmol/L — ABNORMAL LOW (ref 22–32)
Calcium: 9.2 mg/dL (ref 8.9–10.3)
Chloride: 105 mmol/L (ref 98–111)
Creatinine: 1.02 mg/dL (ref 0.61–1.24)
GFR, Est AFR Am: >60 mL/min (ref >60)
GFR, Estimated: >60 mL/min (ref >60)
Glucose, Bld: 146 mg/dL — ABNORMAL HIGH (ref 70–99)
Potassium: 3.9 mmol/L (ref 3.5–5.1)
Sodium: 136 mmol/L (ref 135–145)
Total Bilirubin: 0.4 mg/dL (ref 0.3–1.2)
Total Protein: 8.3 g/dL — ABNORMAL HIGH (ref 6.5–8.1)

## 2018-04-27 LAB — CEA (IN HOUSE-CHCC): CEA (CHCC-In House): 3.64 ng/mL (ref 0.00–5.00)

## 2018-04-27 MED ORDER — IOHEXOL 300 MG/ML  SOLN
100.0000 mL | Freq: Once | INTRAMUSCULAR | Status: AC | PRN
  Administered 2018-04-27: 10:00:00 100 mL via INTRAVENOUS

## 2018-04-27 MED ORDER — SODIUM CHLORIDE (PF) 0.9 % IJ SOLN
INTRAMUSCULAR | Status: AC
  Filled 2018-04-27: qty 50

## 2018-04-28 ENCOUNTER — Inpatient Hospital Stay (HOSPITAL_BASED_OUTPATIENT_CLINIC_OR_DEPARTMENT_OTHER): Payer: Medicare Other | Admitting: Oncology

## 2018-04-28 ENCOUNTER — Telehealth: Payer: Self-pay | Admitting: Oncology

## 2018-04-28 ENCOUNTER — Other Ambulatory Visit: Payer: Self-pay

## 2018-04-28 VITALS — BP 139/68 | HR 120 | Temp 98.0°F | Resp 18 | Ht 70.0 in | Wt 155.0 lb

## 2018-04-28 DIAGNOSIS — I10 Essential (primary) hypertension: Secondary | ICD-10-CM

## 2018-04-28 DIAGNOSIS — C7889 Secondary malignant neoplasm of other digestive organs: Secondary | ICD-10-CM | POA: Diagnosis not present

## 2018-04-28 DIAGNOSIS — C184 Malignant neoplasm of transverse colon: Secondary | ICD-10-CM | POA: Diagnosis not present

## 2018-04-28 DIAGNOSIS — K6389 Other specified diseases of intestine: Secondary | ICD-10-CM

## 2018-04-28 NOTE — Telephone Encounter (Signed)
Scheduled appt per 4/23 los. °

## 2018-04-28 NOTE — Progress Notes (Signed)
[place] OFFICE PROGRESS NOTE   Diagnosis: Colon cancer  INTERVAL HISTORY:   Cody Suarez returns for scheduled visit.  He reports feeling well.  No fever or chills.  He is on a prednisone taper for the inflammatory bowel disease. He has an improved appetite.  No new complaint.  Objective:  Vital signs in last 24 hours:  Blood pressure 139/68, pulse (!) 120, temperature 98 F (36.7 C), temperature source Oral, resp. rate 18, height 5' 10"  (1.778 m), weight 155 lb (70.3 kg), SpO2 100 %.     GI: No hepatomegaly, no mass, nontender     Lab Results:  Lab Results  Component Value Date   WBC 14.0 (H) 04/27/2018   HGB 10.5 (L) 04/27/2018   HCT 33.7 (L) 04/27/2018   MCV 85.5 04/27/2018   PLT 454 (H) 04/27/2018   NEUTROABS 10.6 (H) 04/27/2018    CMP  Lab Results  Component Value Date   NA 136 04/27/2018   K 3.9 04/27/2018   CL 105 04/27/2018   CO2 19 (L) 04/27/2018   GLUCOSE 146 (H) 04/27/2018   BUN 11 04/27/2018   CREATININE 1.02 04/27/2018   CALCIUM 9.2 04/27/2018   PROT 8.3 (H) 04/27/2018   ALBUMIN 3.3 (L) 04/27/2018   AST 17 04/27/2018   ALT 10 04/27/2018   ALKPHOS 74 04/27/2018   BILITOT 0.4 04/27/2018   GFRNONAA >60 04/27/2018   GFRAA >60 04/27/2018    Lab Results  Component Value Date   CEA1 3.64 04/27/2018    Lab Results  Component Value Date   INR 1.56 01/21/2018    Imaging:  Ct Abdomen Pelvis W Contrast  Result Date: 04/27/2018 CLINICAL DATA:  Colorectal carcinoma with metastasis in the LEFT upper quadrant involving the stomach, spleen and pancreas. EXAM: CT ABDOMEN AND PELVIS WITH CONTRAST TECHNIQUE: Multidetector CT imaging of the abdomen and pelvis was performed using the standard protocol following bolus administration of intravenous contrast. CONTRAST:  181m OMNIPAQUE IOHEXOL 300 MG/ML  SOLN COMPARISON:  CT 01/18/2018, 12 3119 FINDINGS: Lower chest: Lung bases are clear. Hepatobiliary: No focal hepatic lesion.  Postcholecystectomy. No biliary dilatation. Pancreas: Head and body of the pancreas normal. Lesion again demonstrated tail the pancreas extending to the stomach and splenic hilum described below. Thick walled mass lesion in the gastrosplenic ligament measures 7.1 x 3.4 cm in axial dimension compared with 6.5 x 3.3 cm remeasured. Visually lesion appears to have slightly thickened walls but is very similar. Lesion measures 6.1 cm in craniocaudad dimension compared to 5.7 cm on prior. Mass distorts the gastric body. Lateral adjacent the lesion on prior scan there is a small fluid collection this has resolved. Spleen: Lesion in  splenic hilum as described above. Adrenals/urinary tract: Adrenal glands and kidneys are normal. The ureters and bladder normal. Stomach/Bowel: Stomach is nonobstructed. Mass in the gastrosplenic ligament indents the greater curvature the stomach. The duodenum and small bowel normal. Cecum normal. The ascending transverse colon normal. There is anastomosis at the splenic flexure. No evidence of obstruction. The colon approximates the LEFT upper quadrant mass described above. The distal descending colon and rectosigmoid colon are small caliber normal. Vascular/Lymphatic: Abdominal aorta is normal caliber with atherosclerotic calcification. There is no retroperitoneal or periportal lymphadenopathy. No pelvic lymphadenopathy. Reproductive: Prostate Other: No free fluid. Musculoskeletal: No aggressive osseous lesion. IMPRESSION: 1. Overall stable to mild progression of mass in the gastrosplenic ligament. Mass continues to distort the lumen of the stomach but no evidence obstruction. The splenic flexure  of the colon approximates the mass but there is no evidence of obstruction. 2. No evidence of metastatic disease elsewhere in the abdomen pelvis. Electronically Signed   By: Suzy Bouchard M.D.   On: 04/27/2018 14:17    Medications: I have reviewed the patient's current  medications.   Assessment/Plan: 1. 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 Negative for a BRAF mutation  Tumor invaded through the muscularis propria into pericolonic fatty tissue and involved the attached omentum.   Cycle 1 adjuvant Xeloda 05/28/2013.   Cycle 2 adjuvant Xeloda 06/18/2013.   Cycle 3 adjuvant Xeloda 07/09/2013.   Cycle 4 adjuvant Xeloda 07/30/2013.   Cycle 5 adjuvant Xeloda 08/20/2013.  Cycle 6 adjuvant Xeloda 09/11/2013.  cycle 7 adjuvant Xeloda 10/02/2013  Cycle 8 adjuvant Xeloda 10/23/2013  CTs of the chest, abdomen, and pelvis on 07/18/2014-negative for recurrent colon cancer   CTs abdomen/pelvis 09/06/2014 showed acute cholecystitis.  Colonoscopy 08/02/2015-chronic colitis, no malignancy  CTs 09/17/2015, new mass near the tail the pancreas, stable lung nodules  Biopsy mesenteric mass 10/04/2015 with metastatic adenocarcinoma consistent with colorectal primary  PET scan 10/10/2015 with soft tissue thickening within the peritoneal space of the left upper quadrant with SUV Max 7.9; no additional sites of peritoneal nodularity to suggest metastasis; hypermetabolic ill-defined nodule in the left lower lobe favored inflammatory.  Resection of the left abdominal mesenteric mass/tail the pancreas on 11/05/2015 with the pathology confirming metastatic colon cancer, positive "serosal" margin  CTs 07/29/2016-no evidence of metastatic disease, stable small lung nodules, decreased size of pancreatic resection bed fluid/mass  CTs 06/28/2017-decreased fluid and increased soft tissue at the area of the distal pancreatectomy with a few foci of gas  CT 09/29/2017-enlargement of left upper quadrant soft tissue mass between the stomach, spleen, and pancreas tail, stable nodules at the  lung bases  CT biopsy of the upper quadrant mass 10/19/2017-metastatic colon cancer, MSS, tumor mutation burden 1, no KRAS, BRAF,  or NRAS mutation  CTs 12/24/2017- complex left upper quadrant process involving the stomach, spleen, tail the pancreas, and distal transverse colon.  CT 04/27/2018-stable to mild progression of the left upper quadrant mass, compression of the stomach with no obstruction, no other evidence of metastatic disease, resolution of small fluid collection lateral to the left upper quadrant mass 2. Ulcerative colitis-extensive chronic active ulcerative colitis was noted on the colon resection specimen 03/28/2013.  Followed by Dr.Magod 3. Hypertension.  4. History of microcytic anemia-likely iron deficiency, unable to tolerate oral iron. Improved. 5. Possible area of cecal wall thickening noted on abdominal CT 03/20/2013. 6. Family history of colon cancer (maternal grandfather died in his 72s with colon cancer). 7. Bilateral calf and low anterior leg pain. Bilateral lower extremity venous duplex negative for DVT 07/14/2013. Right ABI with moderate, borderline severe arterial insufficiency; left ABI suggestive of moderate arterial insufficiency. He was referred to vascular. 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 TEE showed thrombus in the descending aorta. He underwent right popliteal to peroneal artery bypass graft, thrombectomy anterior tibialis and attempted thrombectomy tibioperoneal trunk and posterior tibialis on 08/11/2013. Right calf pain 09/26/2013 with findings of occlusion of right popliteal to peroneal bypass status post thrombolysis. Now on anticoagulation with Xarelto.  recurrent pain and ulceration at the right foot, status post a popliteal to posterior tibial artery bypass using a cephalic vein graft on 30/07/6224 8. Status post  laparoscopic cholecystectomy 09/08/2014 9. Anorexia following the mesenteric mass/pancreas tail  resection-improved 10. Abdominal abscess November 2017-pancreas "cysts "drainage catheter placed 11/26/2015; CT 02/04/2016 with no change to slight decreasein size of residual pancreatic tail fluid collection.Drainage catheter removed approximately early March 2018  CT 02/20/2016-new small left pleural effusion, increased size of the surgical bed fluid collection, indeterminate pulmonary nodules  CT 07/29/2016-decreased fluid/soft tissue density near the pancreas is no evidence of metastatic disease, stable subcentimeter lung nodules 11.  Admission 12/24/2017 with an abdominal abscess, status post placement of a percutaneous drain and antibiotic therapy; CTs abdomen/pelvis 01/04/2018- near complete drainage of previously noted complex fluid collection in the left upper quadrant.  2 small residual collections remain.  Residual soft tissue mass at the clips concerning for residual recurrent neoplasm.  Peritoneal wall thickening also concerning for metastatic disease. Drain removed 01/04/2018  CT 01/18/2018- no change in soft tissue mass at the tail of pancreas with involvement of the gastric fundus and spleen, there is central low attenuation  Admission 01/20/2018 with fever and failure to thrive, no drainable fluid collection, placed on IV antibiotics, discharged to complete an outpatient course of IV ceftriaxone and Flagyl to be completed 02/20/2018 Disposition:  Mr. Bleicher appears well.  He has recovered from the abdominal infection.  The restaging CT reveals no evidence of disease progression with slight enlargement of the left upper quadrant mass.  I reviewed the CT images.  I discussed treatment options with CodyAddo.  He does not wish to consider chemotherapy at present and states that he may never agree to chemotherapy.  The plan is to continue observation.  He will return for an office visit in 2 months.  He will contact us in the interim as needed.  Betsy Coder, MD  04/28/2018  2:00  PM

## 2018-06-06 ENCOUNTER — Telehealth: Payer: Self-pay | Admitting: Family Medicine

## 2018-06-06 NOTE — Telephone Encounter (Signed)
Ok for pt to be on both medications?

## 2018-06-06 NOTE — Telephone Encounter (Signed)
Patient notified of PCP recommendations and is agreement and expresses an understanding.   [place] for [facility] to Discuss results / PCP recommendations / Schedule patient.

## 2018-06-06 NOTE — Telephone Encounter (Signed)
Voltaren gel/cream is the safest for him to use w/ his blood thinner b/c there is minimal absorption.

## 2018-06-06 NOTE — Telephone Encounter (Signed)
Pt states that he has used voltaren cream for arthritis pain on his right knee. Pt states that he is on a blood thinner and asking if this is ok for him to still use as he has read that this medication can cause stomach bleeding. Pt asked that he gets a call back on his cell phone.

## 2018-06-27 ENCOUNTER — Other Ambulatory Visit: Payer: Self-pay

## 2018-06-27 ENCOUNTER — Other Ambulatory Visit: Payer: Self-pay | Admitting: Cardiovascular Disease

## 2018-06-27 MED ORDER — RIVAROXABAN 20 MG PO TABS: ORAL_TABLET | ORAL | 1 refills | Status: DC

## 2018-06-27 NOTE — Telephone Encounter (Signed)
Please review for refill. Thanks!  

## 2018-06-28 ENCOUNTER — Telehealth: Payer: Self-pay | Admitting: Oncology

## 2018-06-28 ENCOUNTER — Other Ambulatory Visit: Payer: Self-pay

## 2018-06-28 ENCOUNTER — Inpatient Hospital Stay: Payer: Medicare Other | Attending: Oncology | Admitting: Oncology

## 2018-06-28 VITALS — BP 158/66 | HR 99 | Temp 99.0°F | Resp 18 | Ht 70.0 in | Wt 152.0 lb

## 2018-06-28 DIAGNOSIS — I10 Essential (primary) hypertension: Secondary | ICD-10-CM | POA: Insufficient documentation

## 2018-06-28 DIAGNOSIS — C7889 Secondary malignant neoplasm of other digestive organs: Secondary | ICD-10-CM | POA: Diagnosis not present

## 2018-06-28 DIAGNOSIS — G4700 Insomnia, unspecified: Secondary | ICD-10-CM | POA: Insufficient documentation

## 2018-06-28 DIAGNOSIS — C184 Malignant neoplasm of transverse colon: Secondary | ICD-10-CM | POA: Insufficient documentation

## 2018-06-28 DIAGNOSIS — K6389 Other specified diseases of intestine: Secondary | ICD-10-CM

## 2018-06-28 DIAGNOSIS — F329 Major depressive disorder, single episode, unspecified: Secondary | ICD-10-CM | POA: Insufficient documentation

## 2018-06-28 MED ORDER — ZOLPIDEM TARTRATE 5 MG PO TABS: 5.0000 mg | ORAL_TABLET | Freq: Every evening | ORAL | 2 refills | Status: DC | PRN

## 2018-06-28 NOTE — Telephone Encounter (Signed)
Gave avs and calendar ° °

## 2018-06-28 NOTE — Progress Notes (Deleted)
  [place] OFFICE PROGRESS NOTE   Diagnosis:   INTERVAL HISTORY:   ***  Objective:  Vital signs in last 24 hours:  Blood pressure (!) 158/66, pulse 99, temperature 99 F (37.2 C), temperature source Oral, resp. rate 18, height 5' 10"  (1.778 m), weight 152 lb (68.9 kg), SpO2 100 %.    HEENT: *** Lymphatics: *** Resp: *** Cardio: *** GI: *** Vascular: *** Neuro:***  Skin:***   Portacath/PICC-without erythema  Lab Results:  Lab Results  Component Value Date   WBC 14.0 (H) 04/27/2018   HGB 10.5 (L) 04/27/2018   HCT 33.7 (L) 04/27/2018   MCV 85.5 04/27/2018   PLT 454 (H) 04/27/2018   NEUTROABS 10.6 (H) 04/27/2018    CMP  Lab Results  Component Value Date   NA 136 04/27/2018   K 3.9 04/27/2018   CL 105 04/27/2018   CO2 19 (L) 04/27/2018   GLUCOSE 146 (H) 04/27/2018   BUN 11 04/27/2018   CREATININE 1.02 04/27/2018   CALCIUM 9.2 04/27/2018   PROT 8.3 (H) 04/27/2018   ALBUMIN 3.3 (L) 04/27/2018   AST 17 04/27/2018   ALT 10 04/27/2018   ALKPHOS 74 04/27/2018   BILITOT 0.4 04/27/2018   GFRNONAA >60 04/27/2018   GFRAA >60 04/27/2018    Lab Results  Component Value Date   CEA1 3.64 04/27/2018    Lab Results  Component Value Date   INR 1.56 01/21/2018    Imaging:  No results found.  Medications: I have reviewed the patient's current medications.   Assessment/Plan:    Disposition: ***  Betsy Coder, MD  06/28/2018  1:02 PM

## 2018-06-28 NOTE — Progress Notes (Signed)
[place] OFFICE PROGRESS NOTE   Diagnosis: Colon cancer  INTERVAL HISTORY:   Cody Suarez returns as scheduled.  He reports feeling well.  No pain, nausea, or bleeding.  He reports a variable appetite.  He becomes "depressed "at times as he is not able to perform some of the activities he did in the past.  He is able to work through the depression with his wife.  He has insomnia.  He has taken Ambien in the past with improvement.  Objective:  Vital signs in last 24 hours:  Blood pressure (!) 158/66, pulse 99, temperature 99 F (37.2 C), temperature source Oral, resp. rate 18, height 5' 10"  (1.778 m), weight 152 lb (68.9 kg), SpO2 100 %.    Limited physical examination secondary to distancing with the [place] pandemic GI: No hepatosplenomegaly, no apparent ascites, no mass, nontender, reducible incisional hernia Vascular: No leg edema   Lab Results:  Lab Results  Component Value Date   WBC 14.0 (H) 04/27/2018   HGB 10.5 (L) 04/27/2018   HCT 33.7 (L) 04/27/2018   MCV 85.5 04/27/2018   PLT 454 (H) 04/27/2018   NEUTROABS 10.6 (H) 04/27/2018    CMP  Lab Results  Component Value Date   NA 136 04/27/2018   K 3.9 04/27/2018   CL 105 04/27/2018   CO2 19 (L) 04/27/2018   GLUCOSE 146 (H) 04/27/2018   BUN 11 04/27/2018   CREATININE 1.02 04/27/2018   CALCIUM 9.2 04/27/2018   PROT 8.3 (H) 04/27/2018   ALBUMIN 3.3 (L) 04/27/2018   AST 17 04/27/2018   ALT 10 04/27/2018   ALKPHOS 74 04/27/2018   BILITOT 0.4 04/27/2018   GFRNONAA >60 04/27/2018   GFRAA >60 04/27/2018    Lab Results  Component Value Date   CEA1 3.64 04/27/2018     Medications: I have reviewed the patient's current medications.   Assessment/Plan: 1. 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 Negative for a BRAF  mutation  Tumor invaded through the muscularis propria into pericolonic fatty tissue and involved the attached omentum.   Cycle 1 adjuvant Xeloda 05/28/2013.   Cycle 2 adjuvant Xeloda 06/18/2013.   Cycle 3 adjuvant Xeloda 07/09/2013.   Cycle 4 adjuvant Xeloda 07/30/2013.   Cycle 5 adjuvant Xeloda 08/20/2013.  Cycle 6 adjuvant Xeloda 09/11/2013.  cycle 7 adjuvant Xeloda 10/02/2013  Cycle 8 adjuvant Xeloda 10/23/2013  CTs of the chest, abdomen, and pelvis on 07/18/2014-negative for recurrent colon cancer   CTs abdomen/pelvis 09/06/2014 showed acute cholecystitis.  Colonoscopy 08/02/2015-chronic colitis, no malignancy  CTs 09/17/2015, new mass near the tail the pancreas, stable lung nodules  Biopsy mesenteric mass 10/04/2015 with metastatic adenocarcinoma consistent with colorectal primary  PET scan 10/10/2015 with soft tissue thickening within the peritoneal space of the left upper quadrant with SUV Max 7.9; no additional sites of peritoneal nodularity to suggest metastasis; hypermetabolic ill-defined nodule in the left lower lobe favored inflammatory.  Resection of the left abdominal mesenteric mass/tail the pancreas on 11/05/2015 with the pathology confirming metastatic colon cancer, positive "serosal" margin  CTs 07/29/2016-no evidence of metastatic disease, stable small lung nodules, decreased size of pancreatic resection bed fluid/mass  CTs 06/28/2017-decreased fluid and increased soft tissue at the area of the distal pancreatectomy with a few foci of gas  CT 09/29/2017-enlargement of left upper quadrant soft tissue mass between the stomach, spleen, and  pancreas tail, stable nodules at the lung bases  CT biopsy of the upper quadrant mass 10/19/2017-metastatic colon cancer, MSS, tumor mutation burden 1, no KRAS, BRAF,  or NRAS mutation  CTs 12/24/2017- complex left upper quadrant process involving the stomach, spleen, tail the pancreas, and distal transverse colon.   CT 04/27/2018-stable to mild progression of the left upper quadrant mass, compression of the stomach with no obstruction, no other evidence of metastatic disease, resolution of small fluid collection lateral to the left upper quadrant mass 2. Ulcerative colitis-extensive chronic active ulcerative colitis was noted on the colon resection specimen 03/28/2013.  Followed by Dr.Magod 3. Hypertension.  4. History of microcytic anemia-likely iron deficiency, unable to tolerate oral iron. Improved. 5. Possible area of cecal wall thickening noted on abdominal CT 03/20/2013. 6. Family history of colon cancer (maternal grandfather died in his 40s with colon cancer). 7. Bilateral calf and low anterior leg pain. Bilateral lower extremity venous duplex negative for DVT 07/14/2013. Right ABI with moderate, borderline severe arterial insufficiency; left ABI suggestive of moderate arterial insufficiency. He was referred to vascular. 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 TEE showed thrombus in the descending aorta. He underwent right popliteal to peroneal artery bypass graft, thrombectomy anterior tibialis and attempted thrombectomy tibioperoneal trunk and posterior tibialis on 08/11/2013. Right calf pain 09/26/2013 with findings of occlusion of right popliteal to peroneal bypass status post thrombolysis. Now on anticoagulation with Xarelto.  recurrent pain and ulceration at the right foot, status post a popliteal to posterior tibial artery bypass using a cephalic vein graft on 15/40/0867 8. Status post laparoscopic cholecystectomy 09/08/2014 9. Anorexia following the mesenteric mass/pancreas tail resection-improved 10. Abdominal abscess November 2017-pancreas "cysts "drainage catheter placed 11/26/2015; CT 02/04/2016 with no change to slight decreasein size of residual pancreatic tail fluid collection.Drainage catheter removed approximately early March 2018  CT 02/20/2016-new small left  pleural effusion, increased size of the surgical bed fluid collection, indeterminate pulmonary nodules  CT 07/29/2016-decreased fluid/soft tissue density near the pancreas is no evidence of metastatic disease, stable subcentimeter lung nodules 11.  Admission 12/24/2017 with an abdominal abscess, status post placement of a percutaneous drain and antibiotic therapy; CTs abdomen/pelvis 01/04/2018- near complete drainage of previously noted complex fluid collection in the left upper quadrant.  2 small residual collections remain.  Residual soft tissue mass at the clips concerning for residual recurrent neoplasm.  Peritoneal wall thickening also concerning for metastatic disease. Drain removed 01/04/2018  CT 01/18/2018- no change in soft tissue mass at the tail of pancreas with involvement of the gastric fundus and spleen, there is central low attenuation  Admission 01/20/2018 with fever and failure to thrive, no drainable fluid collection, placed on IV antibiotics, discharged to complete an outpatient course of IV ceftriaxone and Flagyl  completed 02/20/2018   Disposition: Mr. Filippi appears unchanged.  He does not wish to consider treatment for colon cancer at present.  We prescribed Ambien for insomnia.  He does not wish to take an antidepressant or see a psychologist at present.  He will let us know if the depression symptoms worsen.  He will return for an office visit in 2 months.  15 minutes were spent with the patient today.  The majority of the time was used for counseling and coordination of care.  Betsy Coder, MD  06/28/2018  1:09 PM

## 2018-07-01 DIAGNOSIS — K5190 Ulcerative colitis, unspecified, without complications: Secondary | ICD-10-CM | POA: Diagnosis not present

## 2018-07-01 DIAGNOSIS — Z85038 Personal history of other malignant neoplasm of large intestine: Secondary | ICD-10-CM | POA: Diagnosis not present

## 2018-07-01 DIAGNOSIS — R933 Abnormal findings on diagnostic imaging of other parts of digestive tract: Secondary | ICD-10-CM | POA: Diagnosis not present

## 2018-07-18 ENCOUNTER — Telehealth: Payer: Self-pay | Admitting: *Deleted

## 2018-07-18 ENCOUNTER — Telehealth: Payer: Self-pay | Admitting: Oncology

## 2018-07-18 DIAGNOSIS — C7889 Secondary malignant neoplasm of other digestive organs: Secondary | ICD-10-CM

## 2018-07-18 NOTE — Telephone Encounter (Addendum)
Having stomach pain and nausea every time he eats over the past few weeks. Last night pain much worse and persists. Dull pain that was "9" last night, now "5". Zofran 4 mg helps the nausea a little (no emesis). No fever, bowels moving. Denies any jaundice. States "I'm tired of feeling bad". Per Dr. Benay Spice: Can call in Hydrodocone/APAP every 6 hours prn. CT scan A/P w/contrast (prior to 7/16 if possible) and see NP on 7/16 at 11:15. Patient agrees to this plan and will have wife pick up contrast tomorrow. He has hydrocodone on hand and will start this and it is still in date. Scan authorized and scheduled for 7/16 at 0800. He will have labs at 0730 prior to scan. Instructed NPO 4 hours prior and to drink contrast at 0600 and 0700. He understands and agrees.

## 2018-07-18 NOTE — Telephone Encounter (Signed)
Added lab 7:30 am 7/16 prior to scan. Lab/ct/LT. Confirmed with patient.

## 2018-07-19 ENCOUNTER — Other Ambulatory Visit: Payer: Self-pay | Admitting: Family Medicine

## 2018-07-19 ENCOUNTER — Other Ambulatory Visit: Payer: Self-pay | Admitting: General Practice

## 2018-07-19 MED ORDER — ONDANSETRON HCL 4 MG PO TABS: ORAL_TABLET | ORAL | 3 refills | Status: DC

## 2018-07-21 ENCOUNTER — Inpatient Hospital Stay: Payer: Medicare Other | Attending: Oncology | Admitting: Nurse Practitioner

## 2018-07-21 ENCOUNTER — Encounter: Payer: Self-pay | Admitting: Nurse Practitioner

## 2018-07-21 ENCOUNTER — Telehealth: Payer: Self-pay | Admitting: Nurse Practitioner

## 2018-07-21 ENCOUNTER — Inpatient Hospital Stay: Payer: Medicare Other

## 2018-07-21 ENCOUNTER — Ambulatory Visit (HOSPITAL_COMMUNITY)
Admission: RE | Admit: 2018-07-21 | Discharge: 2018-07-21 | Disposition: A | Discharge status: Home / Self Care | Payer: Medicare Other | Source: Ambulatory Visit | Attending: Oncology | Admitting: Oncology

## 2018-07-21 ENCOUNTER — Other Ambulatory Visit: Payer: Self-pay

## 2018-07-21 VITALS — BP 143/60 | HR 106 | Temp 97.3°F | Resp 17 | Ht 70.0 in | Wt 147.0 lb

## 2018-07-21 DIAGNOSIS — K5900 Constipation, unspecified: Secondary | ICD-10-CM

## 2018-07-21 DIAGNOSIS — K6389 Other specified diseases of intestine: Secondary | ICD-10-CM | POA: Diagnosis not present

## 2018-07-21 DIAGNOSIS — I10 Essential (primary) hypertension: Secondary | ICD-10-CM

## 2018-07-21 DIAGNOSIS — C7889 Secondary malignant neoplasm of other digestive organs: Secondary | ICD-10-CM

## 2018-07-21 DIAGNOSIS — R110 Nausea: Secondary | ICD-10-CM

## 2018-07-21 DIAGNOSIS — C184 Malignant neoplasm of transverse colon: Secondary | ICD-10-CM | POA: Diagnosis not present

## 2018-07-21 DIAGNOSIS — C259 Malignant neoplasm of pancreas, unspecified: Secondary | ICD-10-CM | POA: Diagnosis not present

## 2018-07-21 LAB — CMP (CANCER CENTER ONLY)
ALT: 6 U/L (ref 0–44)
AST: 13 U/L — ABNORMAL LOW (ref 15–41)
Albumin: 3.1 g/dL — ABNORMAL LOW (ref 3.5–5.0)
Alkaline Phosphatase: 78 U/L (ref 38–126)
Anion gap: 14 (ref 5–15)
BUN: 13 mg/dL (ref 8–23)
CO2: 18 mmol/L — ABNORMAL LOW (ref 22–32)
Calcium: 8.7 mg/dL — ABNORMAL LOW (ref 8.9–10.3)
Chloride: 104 mmol/L (ref 98–111)
Creatinine: 0.97 mg/dL (ref 0.61–1.24)
GFR, Est AFR Am: >60 mL/min (ref >60)
GFR, Estimated: >60 mL/min (ref >60)
Glucose, Bld: 152 mg/dL — ABNORMAL HIGH (ref 70–99)
Potassium: 3.8 mmol/L (ref 3.5–5.1)
Sodium: 136 mmol/L (ref 135–145)
Total Bilirubin: 0.2 mg/dL — ABNORMAL LOW (ref 0.3–1.2)
Total Protein: 7.7 g/dL (ref 6.5–8.1)

## 2018-07-21 MED ORDER — IOHEXOL 300 MG/ML  SOLN
100.0000 mL | Freq: Once | INTRAMUSCULAR | Status: AC | PRN
  Administered 2018-07-21: 09:00:00 100 mL via INTRAVENOUS

## 2018-07-21 NOTE — Progress Notes (Addendum)
[place] OFFICE PROGRESS NOTE   Diagnosis: Colon cancer  INTERVAL HISTORY:   Mr. Hamman returns prior to scheduled follow-up for evaluation of abdominal pain.  4 days ago he had an episode of low abdominal pain which he describes as "dull achy".  The pain continued into the following day.  He took a single hydrocodone tablet and the pain resolved.  The pain has not recurred.  He intermittently notes mild nausea with eating.  This is not a new issue.  Bowels moving normally prior to taking the Vicodin tablet earlier in the week.  He has noted some constipation since.  He describes his appetite as "fair".  Objective:  Vital signs in last 24 hours:  Blood pressure (!) 143/60, pulse (!) 106, temperature (!) 97.3 F (36.3 C), temperature source Oral, resp. rate 17, height _0  (1.778 m), weight 147 lb (66.7 kg), SpO2 100 %.    GI: Abdomen soft and nontender.  No hepatomegaly.  No mass.  No apparent ascites. Vascular: No leg edema. Neuro: Alert and oriented.   Lab Results:  Lab Results  Component Value Date   WBC 14.0 (H) 04/27/2018   HGB 10.5 (L) 04/27/2018   HCT 33.7 (L) 04/27/2018   MCV 85.5 04/27/2018   PLT 454 (H) 04/27/2018   NEUTROABS 10.6 (H) 04/27/2018    Imaging:  No results found.  Medications: I have reviewed the patient's current medications.  Assessment/Plan: 1. 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 Negative for a BRAF mutation  Tumor invaded through the muscularis propria into pericolonic fatty tissue and involved the attached omentum.   Cycle 1 adjuvant Xeloda 05/28/2013.   Cycle 2 adjuvant Xeloda 06/18/2013.   Cycle 3 adjuvant Xeloda 07/09/2013.   Cycle 4 adjuvant Xeloda 07/30/2013.   Cycle 5 adjuvant Xeloda 08/20/2013.  Cycle 6 adjuvant  Xeloda 09/11/2013.  cycle 7 adjuvant Xeloda 10/02/2013  Cycle 8 adjuvant Xeloda 10/23/2013  CTs of the chest, abdomen, and pelvis on 07/18/2014-negative for recurrent colon cancer   CTs abdomen/pelvis 09/06/2014 showed acute cholecystitis.  Colonoscopy 08/02/2015-chronic colitis, no malignancy  CTs 09/17/2015, new mass near the tail the pancreas, stable lung nodules  Biopsy mesenteric mass 10/04/2015 with metastatic adenocarcinoma consistent with colorectal primary  PET scan 10/10/2015 with soft tissue thickening within the peritoneal space of the left upper quadrant with SUV Max 7.9; no additional sites of peritoneal nodularity to suggest metastasis; hypermetabolic ill-defined nodule in the left lower lobe favored inflammatory.  Resection of the left abdominal mesenteric mass/tail the pancreas on 11/05/2015 with the pathology confirming metastatic colon cancer, positive "serosal" margin  CTs 07/29/2016-no evidence of metastatic disease, stable small lung nodules, decreased size of pancreatic resection bed fluid/mass  CTs 06/28/2017-decreased fluid and increased soft tissue at the area of the distal pancreatectomy with a few foci of gas  CT 09/29/2017-enlargement of left upper quadrant soft tissue mass between the stomach, spleen, and pancreas tail, stable nodules at the lung bases  CT biopsy of the upper quadrant mass 10/19/2017-metastatic colon cancer, MSS, tumor mutation burden 1, no KRAS, BRAF, or NRAS mutation  CTs 12/24/2017- complex left upper quadrant process involving the stomach, spleen, tail the pancreas, and distal transverse colon.  CT 04/27/2018-stable to mild progression of the left upper quadrant mass, compression of the stomach with no obstruction, no other evidence of metastatic disease, resolution of small fluid  collection lateral to the left upper quadrant mass  CT 07/21/2018-enlargement of the left upper quadrant mass with involvement of the posterior stomach and  spleen.  Possible new right upper lung nodule 2. Ulcerative colitis-extensive chronic active ulcerative colitis was noted on the colon resection specimen 03/28/2013.  Followed by Dr.Magod 3. Hypertension.  4. History of microcytic anemia-likely iron deficiency, unable to tolerate oral iron. Improved. 5. Possible area of cecal wall thickening noted on abdominal CT 03/20/2013. 6. Family history of colon cancer (maternal grandfather died in his 51s with colon cancer). 7. Bilateral calf and low anterior leg pain. Bilateral lower extremity venous duplex negative for DVT 07/14/2013. Right ABI with moderate, borderline severe arterial insufficiency; left ABI suggestive of moderate arterial insufficiency. He was referred to vascular. 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 TEE showed thrombus in the descending aorta. He underwent right popliteal to peroneal artery bypass graft, thrombectomy anterior tibialis and attempted thrombectomy tibioperoneal trunk and posterior tibialis on 08/11/2013. Right calf pain 09/26/2013 with findings of occlusion of right popliteal to peroneal bypass status post thrombolysis. Now on anticoagulation with Xarelto.  recurrent pain and ulceration at the right foot, status post a popliteal to posterior tibial artery bypass using a cephalic vein graft on 01/00/7121 8. Status post laparoscopic cholecystectomy 09/08/2014 9. Anorexia following the mesenteric mass/pancreas tail resection-improved 10. Abdominal abscess November 2017-pancreas "cysts "drainage catheter placed 11/26/2015; CT 02/04/2016 with no change to slight decreasein size of residual pancreatic tail fluid collection.Drainage catheter removed approximately early March 2018  CT 02/20/2016-new small left pleural effusion, increased size of the surgical bed fluid collection, indeterminate pulmonary nodules  CT 07/29/2016-decreased fluid/soft tissue density near the pancreas is no evidence of  metastatic disease, stable subcentimeter lung nodules 11.Admission 12/24/2017 with an abdominal abscess, status post placement of a percutaneous drain and antibiotic therapy; CTs abdomen/pelvis 01/04/2018- near complete drainage of previously noted complex fluid collection in the left upper quadrant.  2 small residual collections remain.  Residual soft tissue mass at the clips concerning for residual recurrent neoplasm.  Peritoneal wall thickening also concerning for metastatic disease. Drain removed 01/04/2018  CT 01/18/2018- no change in soft tissue mass at the tail of pancreas with involvement of the gastric fundus and spleen, there is central low attenuation  Admission 01/20/2018 with fever and failure to thrive, no drainable fluid collection, placed on IV antibiotics, discharged to complete an outpatient course of IV ceftriaxone and Flagyl  completed 02/20/2018  Disposition: Mr. Costlow appears unchanged.  He had an episode of abdominal pain earlier in the week.  The pain has resolved.  He underwent restaging CTs earlier this morning.  Preliminary review indicates an increase in the size of the mass in the gastrosplenic ligament.  We will contact him once the final report is available.  He is not inclined to begin systemic therapy unless the pain becomes a persistent problem.  He is scheduled to return for follow-up on 08/29/2018.  He understands to contact the office with recurrent pain or any other new issues.  Patient seen with Dr. Benay Spice.    Ned Card ANP/GNP-BC   07/21/2018  11:27 AM This was a shared visit with Ned Card.  Mr. Pousson was interviewed and examined.  I reviewed the CT images.  He had a transient episode of left abdominal pain earlier this week.  The pain has resolved.  The CT images show local disease progression and a possible new lung nodule.  He feels well today.  He does not  wish to begin systemic therapy at present.  I suspect he will develop symptoms from the  metastatic colon cancer in the near future.  He left the office prior to the final reading on the CT today.  We will contact him and recommend he proceed with Port-A-Cath placement in anticipation of the need to begin systemic therapy in the near future.  Julieanne Manson, MD

## 2018-07-21 NOTE — Telephone Encounter (Signed)
I called Cody Suarez to review the final CT report.  He understands there is evidence of progression, the left upper quadrant mass is larger and there may be a new lung nodule.  Dr. Benay Spice recommends a referral to Dr. Hassell Done for Port-A-Cath placement with the plan to begin FOLFOX in the next 4 to 6 weeks.  We will arrange for a follow-up visit here in about 2 weeks.  Mr. Prosch is in agreement.

## 2018-07-22 ENCOUNTER — Telehealth: Payer: Self-pay | Admitting: Oncology

## 2018-07-22 ENCOUNTER — Encounter: Payer: Self-pay | Admitting: *Deleted

## 2018-07-22 NOTE — Progress Notes (Signed)
Faxed referral and last office note to Dr. Hassell Done at [place]. Patient needs a port.

## 2018-07-22 NOTE — Telephone Encounter (Signed)
Scheduled appt per 7/17 sch message - pt aware of appt date and time and referral place in [facility] for Dr. Hassell Done

## 2018-07-25 ENCOUNTER — Telehealth: Payer: Self-pay | Admitting: Cardiovascular Disease

## 2018-07-25 NOTE — Telephone Encounter (Signed)
° °  [name] Pre-operative Risk Assessment    Request for surgical clearance:  1. What type of surgery is being performed? URGENT port a cath placement    2. When is this surgery scheduled? ASAP   3. What type of clearance is required (medical clearance vs. Pharmacy clearance to hold med vs. Both)? Both   4. Are there any medications that need to be held prior to surgery and how long? Warfarin, most likely will need lovenox bridge or patient is on Xarelto and will need instructions on how to HOLD preoperatively    5. Practice name and name of physician performing surgery? [facility], Cody Hausen, MD  6. What is your office phone number [phone]    7.   What is your office fax number [phone], attn April Staton   8.   Anesthesia type (None, local, MAC, general) ? General    Cody Suarez 07/25/2018, 4:42 PM  _________________________________________________________________   (provider comments below)

## 2018-07-26 NOTE — Telephone Encounter (Signed)
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 I will defer decision on holding anticoagulation to Dr. Fletcher Anon.

## 2018-07-27 NOTE — Telephone Encounter (Signed)
   Primary Cardiologist: Kathlyn Sacramento, MD  Chart reviewed as part of pre-operative protocol coverage. Patient was contacted 07/27/2018 in reference to pre-operative risk assessment for pending surgery as outlined below.  [name]. was last seen on 07/06/2017 by Dr. Fletcher Anon.  Since that day, Nazir Hacker. has done has done well from a cardiac perspective. While we typically would prefer to see the patient in the office given the duration of time since we last saw him, this is an urgent request for port a cath placement to start chemotherapy treatment. I spoke to Mr. Weyer today and he denies chest pain, SOB or claudication symptoms. He does have a scheduled follow up appointment with Dr. Fletcher Anon on 08/23/2018 in which he was encouraged to keep.   Per Dr. Fletcher Anon, he may hold Xarelto for two days prior to procedure and resume as soon as possible per procedure team.   Therefore, based on ACC/AHA guidelines, the patient would be at acceptable risk for the planned procedure without further cardiovascular testing.   I will route this recommendation to the requesting party via Epic fax function and remove from pre-op pool.  Please call with questions.  Kathyrn Drown, NP 07/27/2018, 9:44 AM

## 2018-07-27 NOTE — Telephone Encounter (Signed)
Hold Xarelto 2 days before.

## 2018-07-31 ENCOUNTER — Emergency Department (HOSPITAL_COMMUNITY): Payer: Medicare Other

## 2018-07-31 ENCOUNTER — Inpatient Hospital Stay (HOSPITAL_COMMUNITY)
Admission: EM | Admit: 2018-07-31 | Discharge: 2018-08-02 | DRG: 069 | Disposition: A | Discharge status: Home Health | Payer: Medicare Other | Attending: Nephrology | Admitting: Nephrology

## 2018-07-31 ENCOUNTER — Encounter (HOSPITAL_COMMUNITY): Payer: Self-pay

## 2018-07-31 ENCOUNTER — Other Ambulatory Visit: Payer: Self-pay

## 2018-07-31 DIAGNOSIS — Z85038 Personal history of other malignant neoplasm of large intestine: Secondary | ICD-10-CM

## 2018-07-31 DIAGNOSIS — M4312 Spondylolisthesis, cervical region: Secondary | ICD-10-CM | POA: Diagnosis not present

## 2018-07-31 DIAGNOSIS — Z90411 Acquired partial absence of pancreas: Secondary | ICD-10-CM

## 2018-07-31 DIAGNOSIS — C189 Malignant neoplasm of colon, unspecified: Secondary | ICD-10-CM | POA: Diagnosis present

## 2018-07-31 DIAGNOSIS — R471 Dysarthria and anarthria: Secondary | ICD-10-CM | POA: Diagnosis present

## 2018-07-31 DIAGNOSIS — R29818 Other symptoms and signs involving the nervous system: Secondary | ICD-10-CM | POA: Diagnosis not present

## 2018-07-31 DIAGNOSIS — R479 Unspecified speech disturbances: Secondary | ICD-10-CM | POA: Diagnosis not present

## 2018-07-31 DIAGNOSIS — Z8249 Family history of ischemic heart disease and other diseases of the circulatory system: Secondary | ICD-10-CM

## 2018-07-31 DIAGNOSIS — K5190 Ulcerative colitis, unspecified, without complications: Secondary | ICD-10-CM | POA: Diagnosis present

## 2018-07-31 DIAGNOSIS — Z9049 Acquired absence of other specified parts of digestive tract: Secondary | ICD-10-CM

## 2018-07-31 DIAGNOSIS — R29708 NIHSS score 8: Secondary | ICD-10-CM | POA: Diagnosis present

## 2018-07-31 DIAGNOSIS — I708 Atherosclerosis of other arteries: Secondary | ICD-10-CM | POA: Diagnosis not present

## 2018-07-31 DIAGNOSIS — D500 Iron deficiency anemia secondary to blood loss (chronic): Secondary | ICD-10-CM | POA: Diagnosis present

## 2018-07-31 DIAGNOSIS — D509 Iron deficiency anemia, unspecified: Secondary | ICD-10-CM

## 2018-07-31 DIAGNOSIS — Z85068 Personal history of other malignant neoplasm of small intestine: Secondary | ICD-10-CM

## 2018-07-31 DIAGNOSIS — I700 Atherosclerosis of aorta: Secondary | ICD-10-CM | POA: Diagnosis not present

## 2018-07-31 DIAGNOSIS — Z87442 Personal history of urinary calculi: Secondary | ICD-10-CM

## 2018-07-31 DIAGNOSIS — R1902 Left upper quadrant abdominal swelling, mass and lump: Secondary | ICD-10-CM | POA: Diagnosis present

## 2018-07-31 DIAGNOSIS — Z20828 Contact with and (suspected) exposure to other viral communicable diseases: Secondary | ICD-10-CM | POA: Diagnosis not present

## 2018-07-31 DIAGNOSIS — D649 Anemia, unspecified: Secondary | ICD-10-CM | POA: Diagnosis present

## 2018-07-31 DIAGNOSIS — Z79899 Other long term (current) drug therapy: Secondary | ICD-10-CM

## 2018-07-31 DIAGNOSIS — R4701 Aphasia: Secondary | ICD-10-CM | POA: Diagnosis not present

## 2018-07-31 DIAGNOSIS — R410 Disorientation, unspecified: Secondary | ICD-10-CM | POA: Diagnosis not present

## 2018-07-31 DIAGNOSIS — Z7901 Long term (current) use of anticoagulants: Secondary | ICD-10-CM

## 2018-07-31 DIAGNOSIS — I739 Peripheral vascular disease, unspecified: Secondary | ICD-10-CM | POA: Diagnosis present

## 2018-07-31 DIAGNOSIS — Z66 Do not resuscitate: Secondary | ICD-10-CM | POA: Diagnosis present

## 2018-07-31 DIAGNOSIS — E876 Hypokalemia: Secondary | ICD-10-CM | POA: Diagnosis present

## 2018-07-31 DIAGNOSIS — G459 Transient cerebral ischemic attack, unspecified: Principal | ICD-10-CM | POA: Diagnosis present

## 2018-07-31 DIAGNOSIS — Z800 Family history of malignant neoplasm of digestive organs: Secondary | ICD-10-CM

## 2018-07-31 DIAGNOSIS — C799 Secondary malignant neoplasm of unspecified site: Secondary | ICD-10-CM

## 2018-07-31 DIAGNOSIS — G9341 Metabolic encephalopathy: Secondary | ICD-10-CM | POA: Diagnosis present

## 2018-07-31 DIAGNOSIS — Z923 Personal history of irradiation: Secondary | ICD-10-CM

## 2018-07-31 DIAGNOSIS — I749 Embolism and thrombosis of unspecified artery: Secondary | ICD-10-CM | POA: Diagnosis present

## 2018-07-31 DIAGNOSIS — R000 Tachycardia, unspecified: Secondary | ICD-10-CM | POA: Diagnosis not present

## 2018-07-31 DIAGNOSIS — Z888 Allergy status to other drugs, medicaments and biological substances status: Secondary | ICD-10-CM

## 2018-07-31 DIAGNOSIS — Z825 Family history of asthma and other chronic lower respiratory diseases: Secondary | ICD-10-CM

## 2018-07-31 DIAGNOSIS — Z86718 Personal history of other venous thrombosis and embolism: Secondary | ICD-10-CM

## 2018-07-31 DIAGNOSIS — I6523 Occlusion and stenosis of bilateral carotid arteries: Secondary | ICD-10-CM | POA: Diagnosis not present

## 2018-07-31 DIAGNOSIS — C7889 Secondary malignant neoplasm of other digestive organs: Secondary | ICD-10-CM | POA: Diagnosis present

## 2018-07-31 DIAGNOSIS — I10 Essential (primary) hypertension: Secondary | ICD-10-CM | POA: Diagnosis present

## 2018-07-31 DIAGNOSIS — D638 Anemia in other chronic diseases classified elsewhere: Secondary | ICD-10-CM | POA: Diagnosis present

## 2018-07-31 DIAGNOSIS — E860 Dehydration: Secondary | ICD-10-CM | POA: Diagnosis present

## 2018-07-31 LAB — COMPREHENSIVE METABOLIC PANEL
ALT: 6 U/L (ref 0–44)
AST: 18 U/L (ref 15–41)
Albumin: 3.4 g/dL — ABNORMAL LOW (ref 3.5–5.0)
Alkaline Phosphatase: 76 U/L (ref 38–126)
Anion gap: 14 (ref 5–15)
BUN: 11 mg/dL (ref 8–23)
CO2: 17 mmol/L — ABNORMAL LOW (ref 22–32)
Calcium: 9 mg/dL (ref 8.9–10.3)
Chloride: 103 mmol/L (ref 98–111)
Creatinine, Ser: 1.22 mg/dL (ref 0.61–1.24)
GFR calc Af Amer: >60 mL/min (ref >60)
GFR calc non Af Amer: 58 mL/min — ABNORMAL LOW (ref >60)
Glucose, Bld: 134 mg/dL — ABNORMAL HIGH (ref 70–99)
Potassium: 3.4 mmol/L — ABNORMAL LOW (ref 3.5–5.1)
Sodium: 134 mmol/L — ABNORMAL LOW (ref 135–145)
Total Bilirubin: 0.5 mg/dL (ref 0.3–1.2)
Total Protein: 7.9 g/dL (ref 6.5–8.1)

## 2018-07-31 LAB — DIFFERENTIAL
Abs Immature Granulocytes: 0.13 10*3/uL — ABNORMAL HIGH (ref 0.00–0.07)
Basophils Absolute: 0.2 10*3/uL — ABNORMAL HIGH (ref 0.0–0.1)
Basophils Relative: 1 %
Eosinophils Absolute: 0.3 10*3/uL (ref 0.0–0.5)
Eosinophils Relative: 2 %
Immature Granulocytes: 1 %
Lymphocytes Relative: 7 %
Lymphs Abs: 1.3 10*3/uL (ref 0.7–4.0)
Monocytes Absolute: 1.5 10*3/uL — ABNORMAL HIGH (ref 0.1–1.0)
Monocytes Relative: 9 %
Neutro Abs: 14.1 10*3/uL — ABNORMAL HIGH (ref 1.7–7.7)
Neutrophils Relative %: 80 %

## 2018-07-31 LAB — I-STAT CHEM 8, ED
BUN: 8 mg/dL (ref 8–23)
Calcium, Ion: 1.15 mmol/L (ref 1.15–1.40)
Chloride: 104 mmol/L (ref 98–111)
Creatinine, Ser: 1 mg/dL (ref 0.61–1.24)
Glucose, Bld: 132 mg/dL — ABNORMAL HIGH (ref 70–99)
HCT: 22 % — ABNORMAL LOW (ref 39.0–52.0)
Hemoglobin: 7.5 g/dL — ABNORMAL LOW (ref 13.0–17.0)
Potassium: 3.5 mmol/L (ref 3.5–5.1)
Sodium: 135 mmol/L (ref 135–145)
TCO2: 16 mmol/L — ABNORMAL LOW (ref 22–32)

## 2018-07-31 LAB — CBC
HCT: 20.7 % — ABNORMAL LOW (ref 39.0–52.0)
Hemoglobin: 6.2 g/dL — CL (ref 13.0–17.0)
MCH: 21.5 pg — ABNORMAL LOW (ref 26.0–34.0)
MCHC: 30 g/dL (ref 30.0–36.0)
MCV: 71.6 fL — ABNORMAL LOW (ref 80.0–100.0)
Platelets: 587 10*3/uL — ABNORMAL HIGH (ref 150–400)
RBC: 2.89 MIL/uL — ABNORMAL LOW (ref 4.22–5.81)
RDW: 15.6 % — ABNORMAL HIGH (ref 11.5–15.5)
WBC: 17.5 10*3/uL — ABNORMAL HIGH (ref 4.0–10.5)
nRBC: 0 % (ref 0.0–0.2)

## 2018-07-31 LAB — PROTIME-INR
INR: 1.5 — ABNORMAL HIGH (ref 0.8–1.2)
Prothrombin Time: 18.1 seconds — ABNORMAL HIGH (ref 11.4–15.2)

## 2018-07-31 LAB — SARS CORONAVIRUS 2 BY RT PCR (HOSPITAL ORDER, PERFORMED IN ~~LOC~~ HOSPITAL LAB): SARS Coronavirus 2: NEGATIVE

## 2018-07-31 LAB — CBG MONITORING, ED: Glucose-Capillary: 120 mg/dL — ABNORMAL HIGH (ref 70–99)

## 2018-07-31 LAB — PREPARE RBC (CROSSMATCH)

## 2018-07-31 LAB — APTT: aPTT: 30 seconds (ref 24–36)

## 2018-07-31 LAB — POC OCCULT BLOOD, ED: Fecal Occult Bld: NEGATIVE

## 2018-07-31 MED ORDER — ACETAMINOPHEN 160 MG/5ML PO SOLN: 650.0000 mg | ORAL | Status: DC | PRN

## 2018-07-31 MED ORDER — LOSARTAN POTASSIUM 50 MG PO TABS
50.0000 mg | ORAL_TABLET | Freq: Every day | ORAL | Status: DC
  Administered 2018-08-01 – 2018-08-02 (×2): 50 mg via ORAL
  Filled 2018-07-31 (×2): qty 1

## 2018-07-31 MED ORDER — SODIUM CHLORIDE 0.9 % IV SOLN
INTRAVENOUS | Status: DC
  Administered 2018-08-01: 05:00:00 via INTRAVENOUS

## 2018-07-31 MED ORDER — SODIUM CHLORIDE 0.9 % IV BOLUS
1000.0000 mL | Freq: Once | INTRAVENOUS | Status: AC
  Administered 2018-07-31: 1000 mL via INTRAVENOUS

## 2018-07-31 MED ORDER — IOHEXOL 350 MG/ML SOLN
80.0000 mL | Freq: Once | INTRAVENOUS | Status: AC | PRN
  Administered 2018-07-31: 80 mL via INTRAVENOUS

## 2018-07-31 MED ORDER — SODIUM CHLORIDE 0.9% IV SOLUTION
Freq: Once | INTRAVENOUS | Status: AC
  Administered 2018-07-31: via INTRAVENOUS

## 2018-07-31 MED ORDER — ACETAMINOPHEN 325 MG PO TABS: 650.0000 mg | ORAL_TABLET | ORAL | Status: DC | PRN

## 2018-07-31 MED ORDER — SPIRONOLACTONE 25 MG PO TABS
25.0000 mg | ORAL_TABLET | Freq: Every day | ORAL | Status: DC
  Administered 2018-08-01 – 2018-08-02 (×2): 25 mg via ORAL
  Filled 2018-07-31 (×2): qty 1

## 2018-07-31 MED ORDER — STROKE: EARLY STAGES OF RECOVERY BOOK
Freq: Once | Status: AC
  Administered 2018-08-01: 09:00:00
  Filled 2018-07-31: qty 1

## 2018-07-31 MED ORDER — ACETAMINOPHEN 650 MG RE SUPP: 650.0000 mg | RECTAL | Status: DC | PRN

## 2018-07-31 MED ORDER — ZOLPIDEM TARTRATE 5 MG PO TABS: 5.0000 mg | ORAL_TABLET | Freq: Every evening | ORAL | Status: DC | PRN

## 2018-07-31 MED ORDER — POTASSIUM CHLORIDE 20 MEQ/15ML (10%) PO SOLN
40.0000 meq | Freq: Once | ORAL | Status: AC
  Administered 2018-07-31: 40 meq via ORAL
  Filled 2018-07-31: qty 30

## 2018-07-31 MED ORDER — SULFASALAZINE 500 MG PO TBEC
1500.0000 mg | DELAYED_RELEASE_TABLET | Freq: Every day | ORAL | Status: DC
  Administered 2018-08-01 – 2018-08-02 (×2): 1500 mg via ORAL
  Filled 2018-07-31 (×2): qty 3

## 2018-07-31 MED ORDER — SODIUM CHLORIDE (PF) 0.9 % IJ SOLN
INTRAMUSCULAR | Status: AC
  Administered 2018-07-31: 17:00:00
  Filled 2018-07-31: qty 50

## 2018-07-31 MED ORDER — HYDROCODONE-ACETAMINOPHEN 5-325 MG PO TABS
1.0000 | ORAL_TABLET | Freq: Four times a day (QID) | ORAL | Status: DC | PRN
  Administered 2018-07-31 – 2018-08-01 (×2): 1 via ORAL
  Filled 2018-07-31 (×2): qty 1

## 2018-07-31 NOTE — ED Notes (Signed)
Patient heading to CT.

## 2018-07-31 NOTE — ED Notes (Addendum)
Attempted to call [facility] x2 with no answer regarding if wife can stay with pt. Will transport after [facility] answers

## 2018-07-31 NOTE — Consult Note (Signed)
[facility] Consult Services   Date of Service:   07/31/2018 16:30:40  Impression:     .  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 NIHSS 8 on my assessment, pt not a TPA candidate due to being on Xarelto (last dose yesterday). Head CT negative for acute abnl.  CTA H/N: 1. No emergent large vessel occlusion. 2. Mild distal small vessel irregularity without a significant proximal stenosis, aneurysm, or branch vessel occlusion within the circle-of-Willis. 3. Atherosclerotic changes involve the carotid bifurcations bilaterally, the origins of the vertebral arteries bilaterally, and the cavernous internal carotid arteries bilaterally without significant stenosis relative to the more distal vessels. 4.  Aortic Atherosclerosis  PLAN  - MRI brain w/ and w/o contrast  - TTE  - monitor on tele for afib  - consider EEG if pt's mental status doesn't improve  - neuro to follow  ---  Metrics: Last Known Well: 07/31/2018 12:30:00 [facility] Notification Time: 07/31/2018 16:30:40 Arrival Time: 07/31/2018 16:01:00 Stamp Time: 07/31/2018 16:30:40 Time First Login Attempt: 07/31/2018 16:34:39 Video Start Time: 07/31/2018 16:34:39  Symptoms: confusion, agitation. NIHSS Start Assessment Time: 07/31/2018 16:37:53 Patient is not a candidate for tPA. Patient was not deemed candidate for tPA thrombolytics because of Use of NOAs within 48 hours. Video End Time: 07/31/2018 16:54:34  CT head showed no acute hemorrhage or acute core infarct.  Lower Likelihood of Large Vessel Occlusion but Following Stat Studies are Recommended  CTA Head and Neck.  ED Physician notified of diagnostic impression and management plan on 07/31/2018 16:54:37  Sign  Out:     .  Discussed with Emergency Department Provider   ------------------------------------------------------------------------------  History of Present Illness: Patient is a 73 year old Male.  Patient was brought by private transportation with symptoms of confusion, agitation.  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 LKW 12:30 PM today. Wife made the pt lunch but he felt nauseous. Then she noted that he was 'fidgety' and 'confused.' EMTs were called and pt was able to express that he did not want to go to hospital. However, he continued to worsen to the point where he was not really able to talk. Wife brought pt in for eval. On my assessment, pt is minimally verbal. He does not follow 2 step commands, can't tell me his birthday or current month, and can't repeat any sentences. NIHSS 8 for BLE drift, dysarthria, aphasia, and inability to follow commands and answer ?s.  Examination: 1A: Level of Consciousness - Alert; keenly responsive + 0 1B: Ask Month and Age - Could Not Answer Either Question Correctly + 2 1C: Blink Eyes & Squeeze Hands - Performs Both Tasks + 0 2: Test Horizontal Extraocular Movements - Normal + 0 3: Test Visual Fields - No Visual Loss + 0 4: Test Facial Palsy (Use Grimace if Obtunded) - Normal symmetry + 0 5A: Test Left Arm Motor Drift - No Drift for 10 Seconds + 0 5B: Test Right Arm Motor Drift - No Drift for 10 Seconds + 0 6A: Test Left Leg Motor Drift - Drift, but doesn't hit bed + 1 6B: Test Right Leg Motor Drift - Drift, but doesn't hit bed + 1 7: Test Limb Ataxia (FNF/Heel-Shin) - No Ataxia + 0 8: Test Sensation -  Normal; No sensory loss + 0 9: Test Language/Aphasia - Mute/Global Aphasia: No Usable Speech/Auditory Comprehension + 3 10: Test Dysarthria - Mild-Moderate Dysarthria: Slurring but can be understood + 1 11: Test Extinction/Inattention - No abnormality + 0  NIHSS Score: 8   Due to the immediate  potential for life-threatening deterioration due to underlying acute neurologic illness, I spent 20 minutes providing critical care. This time includes time for face to face visit via telemedicine, review of medical records, imaging studies and discussion of findings with providers, the patient and/or family.   Dr Burtis Junes   [facility] [phone]   Case [id number]

## 2018-07-31 NOTE — H&P (Signed)
History and Physical    Cody Suarez. WFU:[id number] DOB: [birth year] DOA: 07/31/2018  PCP: Midge Minium, MD  Patient coming from: Home  I have personally briefly reviewed patient's old medical records in [facility]  Chief Complaint: Speech abnormality  HPI: Cody Suarez. is a 73 y.o. 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  History is limited from patient and is supplemented by wife at bedside, EDP, and chart review.  Per wife, patient has had poor oral intake over the last 2 weeks with frequent nausea which has been somewhat relieved with Zofran as needed.  She states that he has a history of ulcerative colitis most commonly associated with loose stools however recently has had constipation.  She says after lunch on 07/31/2018 he had sudden alteration of mental status with inability to speak.  She felt as if he was trying to speak with but was unable to.  She is not sure if he was able to understand her.  She called EMS due to concern for stroke however on their arrival when asked if it was okay to bring him to the hospital, he said no therefore was not brought immediately to the ED.  Patient's daughter-in-law who works in healthcare came to see him and again recommended further evaluation in the emergency department at which time he was agreeable and was brought by his wife via personal vehicle.  Code stroke was called immediately upon arrival.  At time of admission evaluation patient is able to speak and follow commands.  He does not recall the episode earlier in the day but remembers getting into the car from his home.  He is on Xarelto for history of arterial thromboembolism.  He denies any obvious bleeding including epistaxis,  hemoptysis, hematemesis, hematuria, hematochezia, or melena.  He currently is feeling very thirsty.  He denies any chest pain, dyspnea, abdominal pain, dysuria, or diarrhea.  Patient had a CT abdomen/pelvis with contrast on 07/21/2018 which showed interval increase in size of left upper quadrant gastrosplenic ligament mass and findings suspicious for progressive tumor infiltration or vascular compromise of the upper spleen.  Patient was planned to have Port-A-Cath placement on August 10 in anticipation of starting chemotherapy with FOLFOX.   ED Course:  Initial vitals showed BP 124/73, pulse 112, RR 23, temp 99.2 Fahrenheit, SPO2 100% on room air.  Labs are notable for WBC 17.5, hemoglobin 6.2 (10.5 on 04/27/2018), MCV 71.6, hematocrit 20.7, platelets 587,000, sodium 134, potassium 3.4, bicarb 17, BUN 11, creatinine 1.22.  FOBT was negative.  SARS-CoV-2 test was negative.  Patient arrived as code stroke.  Initial CT head without contrast was negative for acute intracranial abnormality, moderate diffuse atrophy and white matter disease was noted.  CTA head and neck were negative for emergent large vessel occlusion.  Mild distal small vessel irregularity without significant proximal stenosis, aneurysm, or branch vessel occlusion within the circle of Willis was noted.  Telemetry neurology were consulted.  Patient was not considered TPA candidate due to being on Xarelto with last dose 07/30/2018.  MRI brain was recommended and obtained which did not show any acute intracranial abnormality or infarct.  Patient was started on transfusion of 2 units PRBCs.  The hospitalist service was consulted to admit for further evaluation and management.   Review of  Systems: All systems reviewed and are negative except as documented in history of present illness above.   Past Medical History:  Diagnosis Date   Anemia of chronic disease 2015   "related to cancer tx" (08/09/2013)   Blood clot in vein 2015    Colon cancer ([place]) 03/2013   History of kidney stones    x 2passed them both   Hypertension    Peripheral vascular disease ([place])    Ulcerative colitis ([place])    history    Past Surgical History:  Procedure Laterality Date   ABDOMINAL AORTAGRAM N/A 08/09/2013   Procedure: ABDOMINAL Maxcine Ham;  Surgeon: [name], MD;  Location: [facility];  Service: Cardiovascular;  Laterality: N/A;   CHOLECYSTECTOMY N/A 09/08/2014   Procedure: LAPAROSCOPIC CHOLECYSTECTOMY WITH INTRAOPERATIVE CHOLANGIOGRAM;  Surgeon: Armandina Gemma, MD;  Location: [facility];  Service: General;  Laterality: N/A;   COLON SURGERY  02/2013   COLON SURGERY  10/2015   FEMORAL-POPLITEAL BYPASS GRAFT Right 08/11/2013   Procedure:   RIGHT - POPLITEAL TO PERONEAL ARTERY BYPASS GRAFT  WITH NONREVERSED SAPHENOUS VEIN GRAFT,tHROMBECTOMY ANTERIOR TIBIALIS,ATTEMPTED THROMBECTOMY TIBIO-PERONEAL TRUNK AND POSTERIOR TIBIALIS, INTRAOPERATIVE ARTERIOGRAM.;  Surgeon: Mal Misty, MD;  Location: [place];  Service: Vascular;  Laterality: Right;   FEMORAL-TIBIAL BYPASS GRAFT Right 11/17/2013   Procedure: BYPASS GRAFT RIGHT ABOVE KNEE POPLITEAL TO POSTERIOR TIBIAL ARTERY USING RIGHT NON-REVERSED CEPHALIC VEIN;  Surgeon: Mal Misty, MD;  Location: [place];  Service: Vascular;  Laterality: Right;   HERNIA REPAIR  ~ 1995; 03/2013   UHR   INTRAOPERATIVE ARTERIOGRAM Right 11/17/2013   Procedure: INTRA OPERATIVE ARTERIOGRAM;  Surgeon: Mal Misty, MD;  Location: [place];  Service: Vascular;  Laterality: Right;   IR GENERIC HISTORICAL  12/11/2015   IR RADIOLOGIST EVAL & MGMT 12/11/2015 Arne Cleveland, MD [facility]   IR GENERIC HISTORICAL  12/24/2015   IR RADIOLOGIST EVAL & MGMT 12/24/2015 Jacqulynn Cadet, MD [facility]   IR GENERIC HISTORICAL  01/21/2016   IR RADIOLOGIST EVAL & MGMT 01/21/2016 [name], MD [facility]   IR GENERIC HISTORICAL  02/04/2016   IR RADIOLOGIST EVAL & MGMT 02/04/2016 Sandi Mariscal, MD [facility]  INTERV RAD   IR GENERIC HISTORICAL  02/12/2016   IR CATHETER TUBE CHANGE 02/12/2016 Corrie Mckusick, DO [facility]   IR GENERIC HISTORICAL  02/20/2016   IR RADIOLOGIST EVAL & MGMT 02/20/2016 Markus Daft, MD [facility]   IR SINUS/FIST TUBE CHK-NON GI  01/04/2018   LAPAROSCOPIC RIGHT HEMI COLECTOMY N/A 03/28/2013   Procedure: LAPAROSCOPIC ASSISTED HEMI COLECTOMY;  Surgeon: Pedro Earls, MD;  [facility];  Service: General;  Laterality: N/A;   LAPAROSCOPY N/A 11/05/2015   Procedure: LAPAROSCOPY DIAGNOSTIC;  Surgeon: [name], MD;  Location: [facility];  Service: General;  Laterality: N/A;   LAPAROTOMY N/A 11/05/2015   Procedure: EXPLORATORY LAPAROTOMY, resection of mass at tail of pancreas;  Surgeon: [name], MD;  Location: [facility];  Service: General;  Laterality: N/A;   LOWER EXTREMITY ANGIOGRAM Bilateral 08/09/2013   TEE WITHOUT CARDIOVERSION N/A 08/10/2013   Procedure: TRANSESOPHAGEAL ECHOCARDIOGRAM (TEE);  Surgeon: Candee Furbish, MD;  Location: [facility]OSCOPY;  Service: Cardiovascular;  Laterality: N/A;   TONSILLECTOMY  ~ 3500   UMBILICAL HERNIA REPAIR  1995; 03/2013   VASECTOMY     VEIN HARVEST Right 11/17/2013   Procedure: HARVEST OF RIGHT UPPER EXTREMITY CEPHALIC VEIN;  Surgeon: Mal Misty, MD;  Location: [place];  Service: Vascular;  Laterality: Right;    Social History:  reports that he has never smoked. He has never used smokeless tobacco. He reports that he does not drink alcohol or use drugs.  Allergies  Allergen Reactions   Amlodipine Swelling    Right LE edema   Gabapentin Rash    Short-term memory decrease    Family History  Problem Relation Age of Onset   Heart disease Mother    Emphysema Father      Prior to Admission medications   Medication Sig Start Date End Date Taking? Authorizing Provider  acetaminophen (TYLENOL) 500 MG tablet Take 1,000 mg by mouth daily as needed for mild pain or headache.    Yes [provider]    calcium carbonate (TUMS EX) 750 MG chewable tablet Chew 1-2 tablets by mouth daily as needed for heartburn.   Yes [provider]  Coenzyme Q10 (COQ10 PO) Take 1 capsule by mouth daily.   Yes [provider]  Homeopathic Products ([place] EX) Apply 1 application topically daily as needed (calf pain).   Yes [provider]  HYDROcodone-acetaminophen (NORCO/VICODIN) 5-325 MG tablet Take 1 tablet by mouth every 6 (six) hours as needed for moderate pain.    Yes [provider]  losartan (COZAAR) 100 MG tablet Take 0.5 tablets (50 mg total) by mouth daily. 10/21/17  Yes [name], MD  megestrol (MEGACE) 40 MG tablet Take 4 tablets (160 mg total) by mouth daily. Patient taking differently: Take 40 mg by mouth daily.  01/24/18  Yes Nita Sells, MD  Multiple Vitamin (MULTIVITAMIN WITH MINERALS) TABS tablet Take 1 tablet by mouth daily.   Yes [provider]  ondansetron (ZOFRAN) 4 MG tablet TAKE 1 TABLET BY MOUTH EVERY 8 HOURS AS NEEDED FOR NAUSEA FOR VOMITING Patient taking differently: Take 4 mg by mouth every 8 (eight) hours as needed for nausea or vomiting.  07/19/18  Yes Midge Minium, MD  rivaroxaban (XARELTO) 20 MG TABS tablet Take 1 tablet by mouth once daily with breakfast 06/27/18  Yes [name], MD  spironolactone (ALDACTONE) 25 MG tablet TAKE 1 TABLET BY MOUTH ONCE DAILY Patient taking differently: Take 25 mg by mouth daily.  03/02/18  Yes [name], MD  sulfaSALAzine (AZULFIDINE) 500 MG EC tablet Take 1,500 mg by mouth daily.  04/23/17  Yes [provider]  zolpidem (AMBIEN) 5 MG tablet Take 1 tablet (5 mg total) by mouth at bedtime as needed for sleep. 06/28/18  Yes [name], MD  Liniments (BLUE-EMU SUPER STRENGTH EX) Apply 1 application topically daily as needed (knee pain).    [provider]  predniSONE (DELTASONE) 2.5 MG tablet Take 1 tablet (2.5 mg total) by mouth daily before  breakfast. Patient not taking: Reported on 07/21/2018 01/25/18   Nita Sells, MD    Physical Exam: Vitals:   07/31/18 2230 07/31/18 2348 08/01/18 0120 08/01/18 0145  BP: (!) 135/56 (!) 146/58 131/63 (!) 124/53  Pulse: 100 87 88 83  Resp: 18 14 14 14   Temp:  98.5 F (36.9 C) 98 F (36.7 C) 98.3 F (36.8 C)  TempSrc:  Oral Oral Oral  SpO2: 98%  100%   Weight:      Height:        Constitutional: Chronically ill-appearing man resting supine in bed, appears tired but in no acute distress, he is calm and answering questions with brief sentences eyes: PERRL, EOMI, slight conjunctival pallor ENMT: Mucous membranes are dry. Posterior  pharynx clear of any exudate or lesions.Normal dentition.  Neck: normal, supple, no masses. Respiratory: Bibasilar inspiratory crackles.. Normal respiratory effort. No accessory muscle use.  Cardiovascular: Tachycardic with regular and rhythm, no murmurs / rubs / gallops. No extremity edema. 2+ pedal pulses. Abdomen: no tenderness, no masses palpated. No hepatosplenomegaly. Bowel sounds positive.  Musculoskeletal: no clubbing / cyanosis. No joint deformity upper and lower extremities. Good ROM, no contractures. Normal muscle tone.  Skin: no rashes, lesions, ulcers. No induration Neurologic: CN 2-12 grossly intact. Sensation intact, Strength 5/5 in both upper extremities, 4/5 in both lower extremities. Psychiatric: Lethargic but awake, alert, and answering brief questions.  He is oriented to self, knows his wife at bedside, and knows the year.  He knows where he is with prompting.    Labs on Admission: I have personally reviewed following labs and imaging studies  CBC: Recent Labs  Lab 07/31/18 1643 07/31/18 1650  WBC 17.5*  --   NEUTROABS 14.1*  --   HGB 6.2* 7.5*  HCT 20.7* 22.0*  MCV 71.6*  --   PLT 587*  --    Basic Metabolic Panel: Recent Labs  Lab 07/31/18 1643 07/31/18 1650  NA 134* 135  K 3.4* 3.5  CL 103 104  CO2 17*  --     GLUCOSE 134* 132*  BUN 11 8  CREATININE 1.22 1.00  CALCIUM 9.0  --    GFR: Estimated Creatinine Clearance: 64.6 mL/min (by C-G formula based on SCr of 1 mg/dL). Liver Function Tests: Recent Labs  Lab 07/31/18 1643  AST 18  ALT 6  ALKPHOS 76  BILITOT 0.5  PROT 7.9  ALBUMIN 3.4*   No results for input(s): LIPASE, AMYLASE in the last 168 hours. No results for input(s): AMMONIA in the last 168 hours. Coagulation Profile: Recent Labs  Lab 07/31/18 1643  INR 1.5*   Cardiac Enzymes: No results for input(s): CKTOTAL, CKMB, CKMBINDEX, TROPONINI in the last 168 hours. BNP (last 3 results) No results for input(s): PROBNP in the last 8760 hours. HbA1C: No results for input(s): HGBA1C in the last 72 hours. CBG: Recent Labs  Lab 07/31/18 1618  GLUCAP 120*   Lipid Profile: No results for input(s): CHOL, HDL, LDLCALC, TRIG, CHOLHDL, LDLDIRECT in the last 72 hours. Thyroid Function Tests: No results for input(s): TSH, T4TOTAL, FREET4, T3FREE, THYROIDAB in the last 72 hours. Anemia Panel: No results for input(s): VITAMINB12, FOLATE, FERRITIN, TIBC, IRON, RETICCTPCT in the last 72 hours. Urine analysis:    Component Value Date/Time   COLORURINE YELLOW 01/20/2018 1552   APPEARANCEUR CLEAR 01/20/2018 1552   LABSPEC 1.012 01/20/2018 1552   PHURINE 6.0 01/20/2018 1552   GLUCOSEU NEGATIVE 01/20/2018 1552   HGBUR NEGATIVE 01/20/2018 1552   BILIRUBINUR NEGATIVE 01/20/2018 1552   KETONESUR 5 (A) 01/20/2018 1552   PROTEINUR NEGATIVE 01/20/2018 1552   UROBILINOGEN 0.2 09/09/2014 1638   NITRITE NEGATIVE 01/20/2018 1552   LEUKOCYTESUR NEGATIVE 01/20/2018 1552    Radiological Exams on Admission: Ct Angio Head W Or Wo Contrast  Result Date: 07/31/2018 CLINICAL DATA:  Focal neuro deficit. Mental status. Difficulty following words. EXAM: CT ANGIOGRAPHY HEAD AND NECK TECHNIQUE: Multidetector CT imaging of the head and neck was performed using the standard protocol during bolus  administration of intravenous contrast. Multiplanar CT image reconstructions and MIPs were obtained to evaluate the vascular anatomy. Carotid stenosis measurements (when applicable) are obtained utilizing NASCET criteria, using the distal internal carotid diameter as the denominator. CONTRAST:  5m OMNIPAQUE IOHEXOL 350  MG/ML SOLN COMPARISON:  CT head without contrast 07/31/2018 FINDINGS: CTA NECK FINDINGS Aortic arch: There is a common origin of the left common carotid artery and the innominate artery. Atherosclerotic changes are present the aortic arch without aneurysm. The left vertebral artery originates directly from the arch, distal to the left subclavian artery. Right carotid system: The right common carotid artery is within normal limits. Atherosclerotic irregularity is present in proximal right ICA without a significant stenosis. More distal cervical right ICA is normal. Left carotid system: The left common carotid artery is within normal limits. Bulky calcifications are present along the medial aspect of the proximal left ICA without significant stenosis relative to the more distal vessel. The distal left ICA is normal. Vertebral arteries: The vertebral arteries are codominant. Atherosclerotic calcifications are present at the origin of the right vertebral artery from the subclavian artery without significant stenosis. There is no significant stenosis in either vertebral artery in the neck. Skeleton: Slight degenerative anterolisthesis is present at C3-4. Mild endplate changes are present most notably at C2-3 and C3-4. No focal lytic or blastic lesions are present. Other neck: No focal mucosal or submucosal lesions are present. Cords are midline and symmetric. A 10 mm incidental left thyroid nodule is noted posteriorly. Thyroid is otherwise mildly atrophic. The salivary glands are within normal limits. No significant cervical adenopathy is present Upper chest: Lung apices are clear without focal nodule,  mass, or airspace disease. Thoracic inlet is normal. Review of the MIP images confirms the above findings CTA HEAD FINDINGS Anterior circulation: Dense calcifications are present in the cavernous internal carotid arteries bilaterally without significant stenosis through the ICA termini. Distal ICA is normal bilaterally. The A1 and M1 segments are normal. The anterior communicating artery is patent. The MCA bifurcations are within normal limits. There is segmental irregularity and distal ACA and MCA branch vessels without a significant proximal stenosis or occlusion. Posterior circulation: Dense atherosclerotic calcifications are present at the dural margin the right vertebral artery without a significant stenosis. PICA origins are visualized and normal. Basilar artery is normal. Both posterior cerebral arteries originate from the basilar tip. There is some irregularity of distal PCA branch vessels without a significant proximal stenosis or occlusion. Venous sinuses: The dural sinuses are patent. The right transverse sinus is dominant. The straight sinus deep cerebral veins are intact. Cortical veins are unremarkable. Anatomic variants: None Review of the MIP images confirms the above findings IMPRESSION: 1. No emergent large vessel occlusion. 2. Mild distal small vessel irregularity without a significant proximal stenosis, aneurysm, or branch vessel occlusion within the circle-of-Willis. 3. Atherosclerotic changes involve the carotid bifurcations bilaterally, the origins of the vertebral arteries bilaterally, and the cavernous internal carotid arteries bilaterally without significant stenosis relative to the more distal vessels. 4.  Aortic Atherosclerosis (ICD10-I70.0). Electronically Signed   By: San Morelle M.D.   On: 07/31/2018 17:40   Ct Angio Neck W And/or Wo Contrast  Result Date: 07/31/2018 CLINICAL DATA:  Focal neuro deficit. Mental status. Difficulty following words. EXAM: CT ANGIOGRAPHY HEAD  AND NECK TECHNIQUE: Multidetector CT imaging of the head and neck was performed using the standard protocol during bolus administration of intravenous contrast. Multiplanar CT image reconstructions and MIPs were obtained to evaluate the vascular anatomy. Carotid stenosis measurements (when applicable) are obtained utilizing NASCET criteria, using the distal internal carotid diameter as the denominator. CONTRAST:  90m OMNIPAQUE IOHEXOL 350 MG/ML SOLN COMPARISON:  CT head without contrast 07/31/2018 FINDINGS: CTA NECK FINDINGS Aortic arch: There is  a common origin of the left common carotid artery and the innominate artery. Atherosclerotic changes are present the aortic arch without aneurysm. The left vertebral artery originates directly from the arch, distal to the left subclavian artery. Right carotid system: The right common carotid artery is within normal limits. Atherosclerotic irregularity is present in proximal right ICA without a significant stenosis. More distal cervical right ICA is normal. Left carotid system: The left common carotid artery is within normal limits. Bulky calcifications are present along the medial aspect of the proximal left ICA without significant stenosis relative to the more distal vessel. The distal left ICA is normal. Vertebral arteries: The vertebral arteries are codominant. Atherosclerotic calcifications are present at the origin of the right vertebral artery from the subclavian artery without significant stenosis. There is no significant stenosis in either vertebral artery in the neck. Skeleton: Slight degenerative anterolisthesis is present at C3-4. Mild endplate changes are present most notably at C2-3 and C3-4. No focal lytic or blastic lesions are present. Other neck: No focal mucosal or submucosal lesions are present. Cords are midline and symmetric. A 10 mm incidental left thyroid nodule is noted posteriorly. Thyroid is otherwise mildly atrophic. The salivary glands are  within normal limits. No significant cervical adenopathy is present Upper chest: Lung apices are clear without focal nodule, mass, or airspace disease. Thoracic inlet is normal. Review of the MIP images confirms the above findings CTA HEAD FINDINGS Anterior circulation: Dense calcifications are present in the cavernous internal carotid arteries bilaterally without significant stenosis through the ICA termini. Distal ICA is normal bilaterally. The A1 and M1 segments are normal. The anterior communicating artery is patent. The MCA bifurcations are within normal limits. There is segmental irregularity and distal ACA and MCA branch vessels without a significant proximal stenosis or occlusion. Posterior circulation: Dense atherosclerotic calcifications are present at the dural margin the right vertebral artery without a significant stenosis. PICA origins are visualized and normal. Basilar artery is normal. Both posterior cerebral arteries originate from the basilar tip. There is some irregularity of distal PCA branch vessels without a significant proximal stenosis or occlusion. Venous sinuses: The dural sinuses are patent. The right transverse sinus is dominant. The straight sinus deep cerebral veins are intact. Cortical veins are unremarkable. Anatomic variants: None Review of the MIP images confirms the above findings IMPRESSION: 1. No emergent large vessel occlusion. 2. Mild distal small vessel irregularity without a significant proximal stenosis, aneurysm, or branch vessel occlusion within the circle-of-Willis. 3. Atherosclerotic changes involve the carotid bifurcations bilaterally, the origins of the vertebral arteries bilaterally, and the cavernous internal carotid arteries bilaterally without significant stenosis relative to the more distal vessels. 4.  Aortic Atherosclerosis (ICD10-I70.0). Electronically Signed   By: San Morelle M.D.   On: 07/31/2018 17:40   Mr Brain Wo Contrast  Result Date:  07/31/2018 CLINICAL DATA:  Initial evaluation for acute speech difficulty, confusion. EXAM: MRI HEAD WITHOUT CONTRAST TECHNIQUE: Multiplanar, multiecho pulse sequences of the brain and surrounding structures were obtained without intravenous contrast. COMPARISON:  Prior head CT from earlier the same day. FINDINGS: Brain: Examination moderately degraded by motion artifact. Diffuse prominence of the CSF containing spaces compatible with generalized age-related cerebral atrophy. Patchy T2/FLAIR hyperintensity within the periventricular and deep white matter both cerebral hemispheres most consistent with chronic microvascular ischemic disease, mild for age. No abnormal foci of restricted diffusion to suggest acute or subacute ischemia. Gray-white matter differentiation maintained. No encephalomalacia to suggest chronic cortical infarction. No foci of susceptibility artifact  to suggest acute or chronic intracranial hemorrhage. No mass lesion, midline shift or mass effect. No hydrocephalus. No extra-axial fluid collection. Pituitary gland suprasellar region within normal limits. Midline structures intact and normal. Vascular: Major intracranial vascular flow voids are maintained. Skull and upper cervical spine: Degenerative thickening noted about the tectorial membrane without significant stenosis. Craniocervical junction otherwise unremarkable. Bone marrow signal intensity within normal limits. No scalp soft tissue abnormality. Sinuses/Orbits: Globes and orbital soft tissues within normal limits. Chronic right maxillary sinusitis noted. Paranasal sinuses are otherwise clear. No mastoid effusion. Inner ear structures normal. Other: None. IMPRESSION: 1. No acute intracranial abnormality. 2. Mild age-related cerebral atrophy with chronic microvascular ischemic disease. 3. Chronic right maxillary sinusitis. Electronically Signed   By: Jeannine Boga M.D.   On: 07/31/2018 20:55   Ct Head Code Stroke Wo  Contrast  Addendum Date: 07/31/2018   ADDENDUM REPORT: 07/31/2018 18:26 ADDENDUM: Voice recognition error: The second sentence of the impression section should read "No acute intracranial abnormality. " Electronically Signed   By: San Morelle M.D.   On: 07/31/2018 18:26   Result Date: 07/31/2018 CLINICAL DATA:  Code stroke. Unable to speak beginning at 12:30 p.m. today. EXAM: CT HEAD WITHOUT CONTRAST TECHNIQUE: Contiguous axial images were obtained from the base of the skull through the vertex without intravenous contrast. COMPARISON:  None FINDINGS: Brain: Moderate atrophy and white matter disease is present bilaterally. Basal ganglia are intact. Insular ribbon is normal. Acute or focal cortical abnormalities are present. The ventricles are proportionate to the degree of atrophy. No significant extraaxial fluid collection is present. Vascular: Atherosclerotic calcifications are present within the cavernous internal carotid arteries and at the dural margin of the right vertebral artery. There is no hyperdense vessel. Skull: Calvarium is intact. No focal lytic or blastic lesions are present. Sinuses/Orbits: Chronic right maxillary sinus disease is present. The paranasal sinuses and mastoid air cells are clear. The globes and orbits are within normal limits. ASPECTS [facility] Stroke Program Early CT Score) - Ganglionic level infarction (caudate, lentiform nuclei, internal capsule, insula, M1-M3 cortex): 7/7 - Supraganglionic infarction (M4-M6 cortex): 3/3 Total score (0-10 with 10 being normal): 10/10 IMPRESSION: 1. Moderate diffuse atrophy and white matter disease. This likely reflects the sequela of chronic microvascular ischemia. 2. Acute intracranial abnormality. 3. ASPECTS is 10/10 4. Chronic right maxillary sinus disease. These results were called by telephone at the time of interpretation on 07/31/2018 at 4:39 pm to Dr. Isla Pence , who verbally acknowledged these results. Electronically Signed:  By: San Morelle M.D. On: 07/31/2018 16:40    EKG: Independently reviewed. Sinus tachycardia, borderline LAD, low voltage.  Not significantly changed when compared to prior.  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 is a 73 y.o. 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   Transient speech abnormality: Unclear etiology, possible TIA versus metabolic encephalopathy in setting of anemia and dehydration.  Symptoms improved back to baseline while in the ED.  CTA head/neck and MRI brain negative for large vessel occlusion or acute infarct respectively. -Monitor on telemetry -Echocardiogram and carotid dopplers ordered -PT/OT/SLP eval -Give 1  L normal saline followed by gentle maintenance fluids overnight  Microcytic anemia: Hemoglobin 6.2 on admission.  No obvious bleeding source, FOBT is negative.  He has been on Xarelto for history of arterial thromboembolism.  Suspect iron deficiency. -Patient being transfused 2 units PRBCs -Check ferritin, lab may be affected as blood transfusion already begun  Metastatic colon cancer: Follows with oncology, Dr. Benay Spice.  Recent CT imaging shows enlarging left upper quadrant mass and possible new lung nodule.  Patient was planned for Port-A-Cath placement in 2 weeks with plan to begin FOLFOX in the next 4 to 6 weeks.  History of arterial thromboembolism: Holding Xarelto with anemia.  Hypokalemia: Oral repletion ordered.  Hypertension: Currently stable.  Continue  home losartan and spironolactone.  Ulcerative colitis: Currently stable, continue home sulfasalazine.  DVT prophylaxis: SCDs Code Status: DNR/DNI confirmed with patient and wife at bedside Family Communication: Discussed with wife at bedside Disposition Plan: Pending clinical progress Consults called: None Admission status: Observation   Zada Finders MD [facility]  If 7PM-7AM, please contact night-coverage [email or web address]  08/01/2018, 2:04 AM

## 2018-07-31 NOTE — ED Provider Notes (Signed)
[facility] DEPT Provider Note   CSN: [id number] Arrival date & time: 07/31/18  1601    History   Chief Complaint Chief Complaint  Patient presents with   Cerebrovascular Accident    HPI Cody Suarez. is a 73 y.o. male.     Pt presents to the ED today with AMS.  Pt has a hx of metastatic colon cancer. The pt's wife gives the hx.  Pt's wife said he was normal around 1230 and then acutely lost the ability to speak or to follow commands.  Pt had difficulty swallowing.  He was brought here by his wife in the car and a code stroke was called immediately upon arrival.       Past Medical History:  Diagnosis Date   Anemia of chronic disease 2015   "related to cancer tx" (08/09/2013)   Blood clot in vein 2015   Colon cancer ([place]) 03/2013   History of kidney stones    x 2passed them both   Hypertension    Peripheral vascular disease ([place])    Ulcerative colitis ([place])    history    Patient Active Problem List   Diagnosis Date Noted   Anemia 07/31/2018   Malnutrition of moderate degree 01/21/2018   Intra-abdominal abscess ([place]) 12/24/2017   Hyperlipidemia 11/12/2017   Erectile dysfunction 05/05/2017   Metastatic colon adenocarcinoma to pancreas s/p distal pancreatectomy 11/05/2015 11/28/2015   History of kidney stones    PAD (peripheral artery disease) ([place]) 01/22/2015   Anemia of chronic disease 09/06/2014   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/2013   Atherosclerotic PVD with ulceration ([place]) 11/14/2013   Atherosclerotic PVD with intermittent claudication ([place]) 10/31/2013   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[place]) 09/26/2013   Hypertension    Arterial thromboembolism ([place]) 08/09/2013   Cancer of splenic flexure colon s/p lap colectomy 03/29/2013 03/23/2013   Right inguinal hernia 83/15/1761   Umbilical hernia 60/73/7106   Ulcerative colitis ([place]) 03/23/2013    Past Surgical History:  Procedure Laterality Date   ABDOMINAL AORTAGRAM  N/A 08/09/2013   Procedure: ABDOMINAL Maxcine Ham;  Surgeon: [name], MD;  Location: [facility];  Service: Cardiovascular;  Laterality: N/A;   CHOLECYSTECTOMY N/A 09/08/2014   Procedure: LAPAROSCOPIC CHOLECYSTECTOMY WITH INTRAOPERATIVE CHOLANGIOGRAM;  Surgeon: Armandina Gemma, MD;  Location: [facility];  Service: General;  Laterality: N/A;   COLON SURGERY  02/2013   COLON SURGERY  10/2015   FEMORAL-POPLITEAL BYPASS GRAFT Right 08/11/2013   Procedure:   RIGHT - POPLITEAL TO PERONEAL ARTERY BYPASS GRAFT  WITH NONREVERSED SAPHENOUS VEIN GRAFT,tHROMBECTOMY ANTERIOR TIBIALIS,ATTEMPTED THROMBECTOMY TIBIO-PERONEAL TRUNK AND POSTERIOR TIBIALIS, INTRAOPERATIVE ARTERIOGRAM.;  Surgeon: Mal Misty, MD;  Location: [facility];  Service: Vascular;  Laterality: Right;   FEMORAL-TIBIAL BYPASS GRAFT Right 11/17/2013   Procedure: BYPASS GRAFT RIGHT ABOVE KNEE POPLITEAL TO POSTERIOR TIBIAL ARTERY USING RIGHT NON-REVERSED CEPHALIC VEIN;  Surgeon: Mal Misty, MD;  Location: [facility];  Service: Vascular;  Laterality: Right;   HERNIA REPAIR  ~ 1995; 03/2013   UHR   INTRAOPERATIVE ARTERIOGRAM Right 11/17/2013   Procedure: INTRA OPERATIVE ARTERIOGRAM;  Surgeon: Mal Misty, MD;  Location: [facility];  Service: Vascular;  Laterality: Right;   IR GENERIC HISTORICAL  12/11/2015   IR RADIOLOGIST EVAL & MGMT 12/11/2015 Arne Cleveland, MD [facility]   IR GENERIC HISTORICAL  12/24/2015   IR RADIOLOGIST EVAL & MGMT 12/24/2015 Jacqulynn Cadet, MD [facility]   IR GENERIC HISTORICAL  01/21/2016   IR RADIOLOGIST EVAL &  MGMT 01/21/2016 [name], MD [facility]   IR GENERIC HISTORICAL  02/04/2016   IR RADIOLOGIST EVAL & MGMT 02/04/2016 Sandi Mariscal, MD [facility]   IR GENERIC HISTORICAL  02/12/2016   IR CATHETER TUBE CHANGE 02/12/2016 Corrie Mckusick, DO [facility]   IR GENERIC HISTORICAL  02/20/2016   IR RADIOLOGIST EVAL & MGMT 02/20/2016 Markus Daft, MD [facility]   IR SINUS/FIST TUBE CHK-NON GI   01/04/2018   LAPAROSCOPIC RIGHT HEMI COLECTOMY N/A 03/28/2013   Procedure: LAPAROSCOPIC ASSISTED HEMI COLECTOMY;  Surgeon: Pedro Earls, MD;  [facility];  Service: General;  Laterality: N/A;   LAPAROSCOPY N/A 11/05/2015   Procedure: LAPAROSCOPY DIAGNOSTIC;  Surgeon: [name], MD;  [facility];  Service: General;  Laterality: N/A;   LAPAROTOMY N/A 11/05/2015   Procedure: EXPLORATORY LAPAROTOMY, resection of mass at tail of pancreas;  Surgeon: [name], MD;  [facility];  Service: General;  Laterality: N/A;   LOWER EXTREMITY ANGIOGRAM Bilateral 08/09/2013   TEE WITHOUT CARDIOVERSION N/A 08/10/2013   Procedure: TRANSESOPHAGEAL ECHOCARDIOGRAM (TEE);  Surgeon: Candee Furbish, MD;  Location[facility];  Service: Cardiovascular;  Laterality: N/A;   TONSILLECTOMY  ~ 3903   UMBILICAL HERNIA REPAIR  1995; 03/2013   VASECTOMY     VEIN HARVEST Right 11/17/2013   Procedure: HARVEST OF RIGHT UPPER EXTREMITY CEPHALIC VEIN;  Surgeon: Mal Misty, MD;  Location: [facility];  Service: Vascular;  Laterality: Right;        Home Medications    Prior to Admission medications   Medication Sig Start Date End Date Taking? Authorizing Provider  acetaminophen (TYLENOL) 500 MG tablet Take 1,000 mg by mouth daily as needed for mild pain or headache.    Yes [provider]  calcium carbonate (TUMS EX) 750 MG chewable tablet Chew 1-2 tablets by mouth daily as needed for heartburn.   Yes [provider]  Coenzyme Q10 (COQ10 PO) Take 1 capsule by mouth daily.   Yes [provider]  Homeopathic Products (Blue Eye EX) Apply 1 application topically daily as needed (calf pain).   Yes [provider]  HYDROcodone-acetaminophen (NORCO/VICODIN) 5-325 MG tablet Take 1 tablet by mouth every 6 (six) hours as needed for moderate pain.    Yes [provider]  losartan (COZAAR) 100 MG tablet Take 0.5 tablets (50 mg total) by mouth daily. 10/21/17   Yes [name], MD  megestrol (MEGACE) 40 MG tablet Take 4 tablets (160 mg total) by mouth daily. Patient taking differently: Take 40 mg by mouth daily.  01/24/18  Yes Nita Sells, MD  Multiple Vitamin (MULTIVITAMIN WITH MINERALS) TABS tablet Take 1 tablet by mouth daily.   Yes [provider]  ondansetron (ZOFRAN) 4 MG tablet TAKE 1 TABLET BY MOUTH EVERY 8 HOURS AS NEEDED FOR NAUSEA FOR VOMITING Patient taking differently: Take 4 mg by mouth every 8 (eight) hours as needed for nausea or vomiting.  07/19/18  Yes Midge Minium, MD  rivaroxaban (XARELTO) 20 MG TABS tablet Take 1 tablet by mouth once daily with breakfast 06/27/18  Yes [name], MD  spironolactone (ALDACTONE) 25 MG tablet TAKE 1 TABLET BY MOUTH ONCE DAILY Patient taking differently: Take 25 mg by mouth daily.  03/02/18  Yes [name], MD  sulfaSALAzine (AZULFIDINE) 500 MG EC tablet Take 1,500 mg by mouth daily.  04/23/17  Yes [provider]  zolpidem (AMBIEN) 5 MG tablet Take 1 tablet (5 mg  total) by mouth at bedtime as needed for sleep. 06/28/18  Yes [name], MD  Liniments (BLUE-EMU SUPER STRENGTH EX) Apply 1 application topically daily as needed (knee pain).    [provider]  predniSONE (DELTASONE) 2.5 MG tablet Take 1 tablet (2.5 mg total) by mouth daily before breakfast. Patient not taking: Reported on 07/21/2018 01/25/18   Nita Sells, MD    Family History Family History  Problem Relation Age of Onset   Heart disease Mother    Emphysema Father     Social History Social History   Tobacco Use   Smoking status: Never Smoker   Smokeless tobacco: Never Used   Tobacco comment: states his father died of emphysema from smoking, he did not want to be like that  Substance Use Topics   Alcohol use: No   Drug use: No     Allergies   Amlodipine and Gabapentin   Review of Systems Review of Systems  Unable to perform ROS: Mental status  change     Physical Exam Updated Vital Signs BP 139/60    Pulse (!) 105    Temp 99.4 F (37.4 C) (Oral)    Resp 19    Ht 5' 10"  (1.778 m) Comment: via wife   Wt 69.4 kg    SpO2 97%    BMI 21.95 kg/m   Physical Exam Vitals signs and nursing note reviewed.  Constitutional:      Appearance: Normal appearance.  HENT:     Head: Normocephalic and atraumatic.     Right Ear: External ear normal.     Left Ear: External ear normal.     Nose: Nose normal.     Mouth/Throat:     Mouth: Mucous membranes are dry.  Eyes:     Extraocular Movements: Extraocular movements intact.     Conjunctiva/sclera: Conjunctivae normal.     Pupils: Pupils are equal, round, and reactive to light.  Neck:     Musculoskeletal: Normal range of motion and neck supple.  Cardiovascular:     Rate and Rhythm: Regular rhythm. Tachycardia present.     Pulses: Normal pulses.     Heart sounds: Normal heart sounds.  Pulmonary:     Effort: Pulmonary effort is normal.     Breath sounds: Normal breath sounds.  Abdominal:     General: Abdomen is flat.     Palpations: Abdomen is soft.  Genitourinary:    Rectum: Guaiac result negative.  Musculoskeletal: Normal range of motion.  Skin:    General: Skin is warm.     Capillary Refill: Capillary refill takes less than 2 seconds.     Coloration: Skin is pale.  Neurological:     Mental Status: He is alert.     Comments: Pt is very confused and is not following commands well.  He is not speaking.  He has drift in arms and in legs.      ED Treatments / Results  Labs (all labs ordered are listed, but only abnormal results are displayed) Labs Reviewed  PROTIME-INR - Abnormal; Notable for the following components:      Result Value   Prothrombin Time 18.1 (*)    INR 1.5 (*)    All other components within normal limits  CBC - Abnormal; Notable for the following components:   WBC 17.5 (*)    RBC 2.89 (*)    Hemoglobin 6.2 (*)    HCT 20.7 (*)    MCV 71.6 (*)  MCH  21.5 (*)    RDW 15.6 (*)    Platelets 587 (*)    All other components within normal limits  DIFFERENTIAL - Abnormal; Notable for the following components:   Neutro Abs 14.1 (*)    Monocytes Absolute 1.5 (*)    Basophils Absolute 0.2 (*)    Abs Immature Granulocytes 0.13 (*)    All other components within normal limits  COMPREHENSIVE METABOLIC PANEL - Abnormal; Notable for the following components:   Sodium 134 (*)    Potassium 3.4 (*)    CO2 17 (*)    Glucose, Bld 134 (*)    Albumin 3.4 (*)    GFR calc non Af Amer 58 (*)    All other components within normal limits  I-STAT CHEM 8, ED - Abnormal; Notable for the following components:   Glucose, Bld 132 (*)    TCO2 16 (*)    Hemoglobin 7.5 (*)    HCT 22.0 (*)    All other components within normal limits  CBG MONITORING, ED - Abnormal; Notable for the following components:   Glucose-Capillary 120 (*)    All other components within normal limits  SARS CORONAVIRUS 2 (HOSPITAL ORDER, [facility])  URINE CULTURE  APTT  URINALYSIS, ROUTINE W REFLEX MICROSCOPIC  FERRITIN  POC OCCULT BLOOD, ED  TYPE AND SCREEN  PREPARE RBC (CROSSMATCH)    EKG EKG Interpretation  Date/Time:  Sunday July 31 2018 16:12:15 EDT Ventricular Rate:  110 PR Interval:    QRS Duration: 81 QT Interval:  361 QTC Calculation: 489 R Axis:   -17 Text Interpretation:  Sinus tachycardia Borderline left axis deviation Low voltage, precordial leads Borderline prolonged QT interval No significant change since last tracing Confirmed by Isla Pence [phone]) on 07/31/2018 6:59:34 PM   Radiology Ct Angio Head W Or Wo Contrast  Result Date: 07/31/2018 CLINICAL DATA:  Focal neuro deficit. Mental status. Difficulty following words. EXAM: CT ANGIOGRAPHY HEAD AND NECK TECHNIQUE: Multidetector CT imaging of the head and neck was performed using the standard protocol during bolus administration of intravenous contrast. Multiplanar CT image  reconstructions and MIPs were obtained to evaluate the vascular anatomy. Carotid stenosis measurements (when applicable) are obtained utilizing NASCET criteria, using the distal internal carotid diameter as the denominator. CONTRAST:  88m OMNIPAQUE IOHEXOL 350 MG/ML SOLN COMPARISON:  CT head without contrast 07/31/2018 FINDINGS: CTA NECK FINDINGS Aortic arch: There is a common origin of the left common carotid artery and the innominate artery. Atherosclerotic changes are present the aortic arch without aneurysm. The left vertebral artery originates directly from the arch, distal to the left subclavian artery. Right carotid system: The right common carotid artery is within normal limits. Atherosclerotic irregularity is present in proximal right ICA without a significant stenosis. More distal cervical right ICA is normal. Left carotid system: The left common carotid artery is within normal limits. Bulky calcifications are present along the medial aspect of the proximal left ICA without significant stenosis relative to the more distal vessel. The distal left ICA is normal. Vertebral arteries: The vertebral arteries are codominant. Atherosclerotic calcifications are present at the origin of the right vertebral artery from the subclavian artery without significant stenosis. There is no significant stenosis in either vertebral artery in the neck. Skeleton: Slight degenerative anterolisthesis is present at C3-4. Mild endplate changes are present most notably at C2-3 and C3-4. No focal lytic or blastic lesions are present. Other neck: No focal mucosal or submucosal lesions are  present. Cords are midline and symmetric. A 10 mm incidental left thyroid nodule is noted posteriorly. Thyroid is otherwise mildly atrophic. The salivary glands are within normal limits. No significant cervical adenopathy is present Upper chest: Lung apices are clear without focal nodule, mass, or airspace disease. Thoracic inlet is normal. Review of  the MIP images confirms the above findings CTA HEAD FINDINGS Anterior circulation: Dense calcifications are present in the cavernous internal carotid arteries bilaterally without significant stenosis through the ICA termini. Distal ICA is normal bilaterally. The A1 and M1 segments are normal. The anterior communicating artery is patent. The MCA bifurcations are within normal limits. There is segmental irregularity and distal ACA and MCA branch vessels without a significant proximal stenosis or occlusion. Posterior circulation: Dense atherosclerotic calcifications are present at the dural margin the right vertebral artery without a significant stenosis. PICA origins are visualized and normal. Basilar artery is normal. Both posterior cerebral arteries originate from the basilar tip. There is some irregularity of distal PCA branch vessels without a significant proximal stenosis or occlusion. Venous sinuses: The dural sinuses are patent. The right transverse sinus is dominant. The straight sinus deep cerebral veins are intact. Cortical veins are unremarkable. Anatomic variants: None Review of the MIP images confirms the above findings IMPRESSION: 1. No emergent large vessel occlusion. 2. Mild distal small vessel irregularity without a significant proximal stenosis, aneurysm, or branch vessel occlusion within the circle-of-Willis. 3. Atherosclerotic changes involve the carotid bifurcations bilaterally, the origins of the vertebral arteries bilaterally, and the cavernous internal carotid arteries bilaterally without significant stenosis relative to the more distal vessels. 4.  Aortic Atherosclerosis (ICD10-I70.0). Electronically Signed   By: San Morelle M.D.   On: 07/31/2018 17:40   Ct Angio Neck W And/or Wo Contrast  Result Date: 07/31/2018 CLINICAL DATA:  Focal neuro deficit. Mental status. Difficulty following words. EXAM: CT ANGIOGRAPHY HEAD AND NECK TECHNIQUE: Multidetector CT imaging of the head and  neck was performed using the standard protocol during bolus administration of intravenous contrast. Multiplanar CT image reconstructions and MIPs were obtained to evaluate the vascular anatomy. Carotid stenosis measurements (when applicable) are obtained utilizing NASCET criteria, using the distal internal carotid diameter as the denominator. CONTRAST:  55m OMNIPAQUE IOHEXOL 350 MG/ML SOLN COMPARISON:  CT head without contrast 07/31/2018 FINDINGS: CTA NECK FINDINGS Aortic arch: There is a common origin of the left common carotid artery and the innominate artery. Atherosclerotic changes are present the aortic arch without aneurysm. The left vertebral artery originates directly from the arch, distal to the left subclavian artery. Right carotid system: The right common carotid artery is within normal limits. Atherosclerotic irregularity is present in proximal right ICA without a significant stenosis. More distal cervical right ICA is normal. Left carotid system: The left common carotid artery is within normal limits. Bulky calcifications are present along the medial aspect of the proximal left ICA without significant stenosis relative to the more distal vessel. The distal left ICA is normal. Vertebral arteries: The vertebral arteries are codominant. Atherosclerotic calcifications are present at the origin of the right vertebral artery from the subclavian artery without significant stenosis. There is no significant stenosis in either vertebral artery in the neck. Skeleton: Slight degenerative anterolisthesis is present at C3-4. Mild endplate changes are present most notably at C2-3 and C3-4. No focal lytic or blastic lesions are present. Other neck: No focal mucosal or submucosal lesions are present. Cords are midline and symmetric. A 10 mm incidental left thyroid nodule is noted posteriorly. Thyroid  is otherwise mildly atrophic. The salivary glands are within normal limits. No significant cervical adenopathy is  present Upper chest: Lung apices are clear without focal nodule, mass, or airspace disease. Thoracic inlet is normal. Review of the MIP images confirms the above findings CTA HEAD FINDINGS Anterior circulation: Dense calcifications are present in the cavernous internal carotid arteries bilaterally without significant stenosis through the ICA termini. Distal ICA is normal bilaterally. The A1 and M1 segments are normal. The anterior communicating artery is patent. The MCA bifurcations are within normal limits. There is segmental irregularity and distal ACA and MCA branch vessels without a significant proximal stenosis or occlusion. Posterior circulation: Dense atherosclerotic calcifications are present at the dural margin the right vertebral artery without a significant stenosis. PICA origins are visualized and normal. Basilar artery is normal. Both posterior cerebral arteries originate from the basilar tip. There is some irregularity of distal PCA branch vessels without a significant proximal stenosis or occlusion. Venous sinuses: The dural sinuses are patent. The right transverse sinus is dominant. The straight sinus deep cerebral veins are intact. Cortical veins are unremarkable. Anatomic variants: None Review of the MIP images confirms the above findings IMPRESSION: 1. No emergent large vessel occlusion. 2. Mild distal small vessel irregularity without a significant proximal stenosis, aneurysm, or branch vessel occlusion within the circle-of-Willis. 3. Atherosclerotic changes involve the carotid bifurcations bilaterally, the origins of the vertebral arteries bilaterally, and the cavernous internal carotid arteries bilaterally without significant stenosis relative to the more distal vessels. 4.  Aortic Atherosclerosis (ICD10-I70.0). Electronically Signed   By: San Morelle M.D.   On: 07/31/2018 17:40   Mr Brain Wo Contrast  Result Date: 07/31/2018 CLINICAL DATA:  Initial evaluation for acute speech  difficulty, confusion. EXAM: MRI HEAD WITHOUT CONTRAST TECHNIQUE: Multiplanar, multiecho pulse sequences of the brain and surrounding structures were obtained without intravenous contrast. COMPARISON:  Prior head CT from earlier the same day. FINDINGS: Brain: Examination moderately degraded by motion artifact. Diffuse prominence of the CSF containing spaces compatible with generalized age-related cerebral atrophy. Patchy T2/FLAIR hyperintensity within the periventricular and deep white matter both cerebral hemispheres most consistent with chronic microvascular ischemic disease, mild for age. No abnormal foci of restricted diffusion to suggest acute or subacute ischemia. Gray-white matter differentiation maintained. No encephalomalacia to suggest chronic cortical infarction. No foci of susceptibility artifact to suggest acute or chronic intracranial hemorrhage. No mass lesion, midline shift or mass effect. No hydrocephalus. No extra-axial fluid collection. Pituitary gland suprasellar region within normal limits. Midline structures intact and normal. Vascular: Major intracranial vascular flow voids are maintained. Skull and upper cervical spine: Degenerative thickening noted about the tectorial membrane without significant stenosis. Craniocervical junction otherwise unremarkable. Bone marrow signal intensity within normal limits. No scalp soft tissue abnormality. Sinuses/Orbits: Globes and orbital soft tissues within normal limits. Chronic right maxillary sinusitis noted. Paranasal sinuses are otherwise clear. No mastoid effusion. Inner ear structures normal. Other: None. IMPRESSION: 1. No acute intracranial abnormality. 2. Mild age-related cerebral atrophy with chronic microvascular ischemic disease. 3. Chronic right maxillary sinusitis. Electronically Signed   By: Jeannine Boga M.D.   On: 07/31/2018 20:55   Ct Head Code Stroke Wo Contrast  Addendum Date: 07/31/2018   ADDENDUM REPORT: 07/31/2018 18:26  ADDENDUM: Voice recognition error: The second sentence of the impression section should read "No acute intracranial abnormality. " Electronically Signed   By: San Morelle M.D.   On: 07/31/2018 18:26   Result Date: 07/31/2018 CLINICAL DATA:  Code stroke. Unable to speak  beginning at 12:30 p.m. today. EXAM: CT HEAD WITHOUT CONTRAST TECHNIQUE: Contiguous axial images were obtained from the base of the skull through the vertex without intravenous contrast. COMPARISON:  None FINDINGS: Brain: Moderate atrophy and white matter disease is present bilaterally. Basal ganglia are intact. Insular ribbon is normal. Acute or focal cortical abnormalities are present. The ventricles are proportionate to the degree of atrophy. No significant extraaxial fluid collection is present. Vascular: Atherosclerotic calcifications are present within the cavernous internal carotid arteries and at the dural margin of the right vertebral artery. There is no hyperdense vessel. Skull: Calvarium is intact. No focal lytic or blastic lesions are present. Sinuses/Orbits: Chronic right maxillary sinus disease is present. The paranasal sinuses and mastoid air cells are clear. The globes and orbits are within normal limits. ASPECTS [facility] Stroke Program Early CT Score) - Ganglionic level infarction (caudate, lentiform nuclei, internal capsule, insula, M1-M3 cortex): 7/7 - Supraganglionic infarction (M4-M6 cortex): 3/3 Total score (0-10 with 10 being normal): 10/10 IMPRESSION: 1. Moderate diffuse atrophy and white matter disease. This likely reflects the sequela of chronic microvascular ischemia. 2. Acute intracranial abnormality. 3. ASPECTS is 10/10 4. Chronic right maxillary sinus disease. These results were called by telephone at the time of interpretation on 07/31/2018 at 4:39 pm to Dr. Isla Pence , who verbally acknowledged these results. Electronically Signed: By: San Morelle M.D. On: 07/31/2018 16:40     Procedures Procedures (including critical care time)  Medications Ordered in ED Medications  0.9 %  sodium chloride infusion (Manually program via Guardrails IV Fluids) (has no administration in time range)  sodium chloride (PF) 0.9 % injection (  Given by Other 07/31/18 1703)  iohexol (OMNIPAQUE) 350 MG/ML injection 80 mL (80 mLs Intravenous Contrast Given 07/31/18 1708)     Initial Impression / Assessment and Plan / ED Course  I have reviewed the triage vital signs and the nursing notes.  Pertinent labs & imaging results that were available during my care of the patient were reviewed by me and considered in my medical decision making (see chart for details).     Dr. Nicoletta Dress (teleneurology) saw pt.  Pt is on Xarelto for blood clots.  The last dose was yesterday.  Pt is not a tpa candidate due to this.  He did recommend a CTA head and neck.  Pt is now more alert.  He is speaking and following commands.    Hgb is very low.  Pt's stool is guaiac negative.  2 units of prbcs were ordered for transfusion.  CRITICAL CARE Performed by: Isla Pence   Total critical care time: 45 minutes  Critical care time was exclusive of separately billable procedures and treating other patients.  Critical care was necessary to treat or prevent imminent or life-threatening deterioration.  Critical care was time spent personally by me on the following activities: development of treatment plan with patient and/or surrogate as well as nursing, discussions with consultants, evaluation of patient's response to treatment, examination of patient, obtaining history from patient or surrogate, ordering and performing treatments and interventions, ordering and review of laboratory studies, ordering and review of radiographic studies, pulse oximetry and re-evaluation of patient's condition.  Covid negative.  Cody Suarez. was evaluated in Emergency Department on 07/31/2018 for the symptoms described in the  history of present illness. He was evaluated in the context of the global COVID-19 pandemic, which necessitated consideration that the patient might be at risk for infection with the SARS-CoV-2 virus that causes COVID-19.  Institutional protocols and algorithms that pertain to the evaluation of patients at risk for COVID-19 are in a state of rapid change based on information released by regulatory bodies including the CDC and federal and state organizations. These policies and algorithms were followed during the patient's care in the ED.  Pt was d/w Dr. Posey Pronto (triad) for admission.  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

## 2018-07-31 NOTE — ED Notes (Addendum)
Pt's wife is taking all pt's belongings including hearing aid home with her. Pt has bag with his clothing and shoes

## 2018-07-31 NOTE — ED Notes (Signed)
ED TO INPATIENT HANDOFF REPORT  Name/Age/Gender Cody Suarez. 73 y.o. male  Code Status Code Status History    Date Active Date Inactive Code Status Order ID Comments User Context   01/20/2018 1551 01/24/2018 1835 Full Code [id number]  Flora Lipps, MD Inpatient   12/24/2017 2247 12/28/2017 1635 Full Code [id number]  Elwyn Reach, MD Inpatient   11/26/2015 0817 11/30/2015 1850 Full Code [id number]  Jill Alexanders, PA-C ED   11/05/2015 1229 11/11/2015 1233 Full Code [id number]  [name], MD Inpatient   09/08/2014 1948 09/10/2014 1504 Full Code [id number]  Armandina Gemma, MD Inpatient   09/07/2014 0004 09/08/2014 1948 Full Code [id number]  Allyne Gee, MD Inpatient   11/17/2013 1643 11/20/2013 1330 Full Code [id number]  Alvia Grove, PA-C Inpatient   09/28/2013 2358 10/01/2013 1531 Full Code [id number]  Corrie Mckusick, DO Inpatient   09/26/2013 1809 09/28/2013 2358 Full Code [id number]  Greggory Keen, MD Inpatient   09/26/2013 1349 09/26/2013 1809 Full Code [id number]  Alvia Grove, PA-C Inpatient   08/11/2013 1828 08/14/2013 1720 Full Code [id number]  Alvia Grove, PA-C Inpatient   08/09/2013 1455 08/11/2013 1828 Full Code [id number]  [name], MD Inpatient   08/09/2013 1454 08/09/2013 1455 Full Code [id number]  Crista Luria Inpatient   03/28/2013 1604 04/04/2013 1400 Full Code [id number]  Kaylyn Lim, MD Inpatient   Advance Care Planning Activity      Home/SNF/Other Home  Chief Complaint Cancer Patient / Nausea /   Level of Care/Admitting Diagnosis ED Disposition    ED Disposition Condition [place]: [place] [[id number]]  Level of Care: Telemetry [5]  Admit to tele based on following criteria: Other see comments  Comments: Possible TIA  Covid Evaluation: Confirmed COVID Negative  Diagnosis: Anemia [177939]  Admitting Physician: Lenore Cordia [[id number]]  Attending Physician: Lenore Cordia [[id number]]  PT Class  (Do Not Modify): Observation [104]  PT Acc Code (Do Not Modify): Observation [10022]       Medical History Past Medical History:  Diagnosis Date  . Anemia of chronic disease 2015   "related to cancer tx" (08/09/2013)  . Blood clot in vein 2015  . Colon cancer ([place]) 03/2013  . History of kidney stones    x 2passed them both  . Hypertension   . Peripheral vascular disease ([place])   . Ulcerative colitis ([place])    history    Allergies Allergies  Allergen Reactions  . Amlodipine Swelling    Right LE edema  . Gabapentin Rash    Short-term memory decrease    IV Location/Drains/Wounds Patient Lines/Drains/Airways Status   Active Line/Drains/Airways    Name:   Placement date:   Placement time:   Site:   Days:   Peripheral IV 07/31/18 Right;Upper Forearm   07/31/18    1641    Forearm   less than 1   Peripheral IV 07/31/18 Left;Lateral Antecubital   07/31/18    1936    Antecubital   less than 1          Labs/Imaging Results for orders placed or performed during the hospital encounter of 07/31/18 (from the past 48 hour(s))  CBG monitoring, ED     Status: Abnormal   Collection Time: 07/31/18  4:18 PM  Result Value Ref Range   Glucose-Capillary 120 (H) 70 - 99 mg/dL  Protime-INR     Status: Abnormal   Collection Time: 07/31/18  4:43  PM  Result Value Ref Range   Prothrombin Time 18.1 (H) 11.4 - 15.2 seconds   INR 1.5 (H) 0.8 - 1.2    Comment: (NOTE) INR goal varies based on device and disease states. Performed at [facility], [place] [address] [place], [place] [address]   APTT     Status: None   Collection Time: 07/31/18  4:43 PM  Result Value Ref Range   aPTT 30 24 - 36 seconds    Comment: Performed at [facility], [place] [address] [place], [place] [address]  CBC     Status: Abnormal   Collection Time: 07/31/18  4:43 PM  Result Value Ref Range   WBC 17.5 (H) 4.0 - 10.5 K/uL   RBC 2.89 (L) 4.22 - 5.81 MIL/uL   Hemoglobin 6.2 (LL) 13.0  - 17.0 g/dL    Comment: Reticulocyte Hemoglobin testing may be clinically indicated, consider ordering this additional test [id number] THIS CRITICAL RESULT HAS VERIFIED AND BEEN CALLED TO R.BRAHAM,RN BY VINCENT WILKINS ON 07 26 2020 AT 1731, AND HAS BEEN READ BACK.     HCT 20.7 (L) 39.0 - 52.0 %   MCV 71.6 (L) 80.0 - 100.0 fL   MCH 21.5 (L) 26.0 - 34.0 pg   MCHC 30.0 30.0 - 36.0 g/dL   RDW 15.6 (H) 11.5 - 15.5 %   Platelets 587 (H) 150 - 400 K/uL   nRBC 0.0 0.0 - 0.2 %    Comment: Performed at [facility], [place] [address] [place], [place] [address]  Differential     Status: Abnormal   Collection Time: 07/31/18  4:43 PM  Result Value Ref Range   Neutrophils Relative % 80 %   Neutro Abs 14.1 (H) 1.7 - 7.7 K/uL   Lymphocytes Relative 7 %   Lymphs Abs 1.3 0.7 - 4.0 K/uL   Monocytes Relative 9 %   Monocytes Absolute 1.5 (H) 0.1 - 1.0 K/uL   Eosinophils Relative 2 %   Eosinophils Absolute 0.3 0.0 - 0.5 K/uL   Basophils Relative 1 %   Basophils Absolute 0.2 (H) 0.0 - 0.1 K/uL   Immature Granulocytes 1 %   Abs Immature Granulocytes 0.13 (H) 0.00 - 0.07 K/uL    Comment: Performed at [facility], [place] [address], [place], [place] [address]  Comprehensive metabolic panel     Status: Abnormal   Collection Time: 07/31/18  4:43 PM  Result Value Ref Range   Sodium 134 (L) 135 - 145 mmol/L   Potassium 3.4 (L) 3.5 - 5.1 mmol/L   Chloride 103 98 - 111 mmol/L   CO2 17 (L) 22 - 32 mmol/L   Glucose, Bld 134 (H) 70 - 99 mg/dL   BUN 11 8 - 23 mg/dL   Creatinine, Ser 1.22 0.61 - 1.24 mg/dL   Calcium 9.0 8.9 - 10.3 mg/dL   Total Protein 7.9 6.5 - 8.1 g/dL   Albumin 3.4 (L) 3.5 - 5.0 g/dL   AST 18 15 - 41 U/L   ALT 6 0 - 44 U/L   Alkaline Phosphatase 76 38 - 126 U/L   Total Bilirubin 0.5 0.3 - 1.2 mg/dL   GFR calc non Af Amer 58 (L) >60 mL/min   GFR calc Af Amer >60 >60 mL/min   Anion gap 14 5 - 15    Comment: Performed at [facility],  [place] [address], [place], [place] [address]  I-stat chem 8, ED  Status: Abnormal   Collection Time: 07/31/18  4:50 PM  Result Value Ref Range   Sodium 135 135 - 145 mmol/L   Potassium 3.5 3.5 - 5.1 mmol/L   Chloride 104 98 - 111 mmol/L   BUN 8 8 - 23 mg/dL   Creatinine, Ser 1.00 0.61 - 1.24 mg/dL   Glucose, Bld 132 (H) 70 - 99 mg/dL   Calcium, Ion 1.15 1.15 - 1.40 mmol/L   TCO2 16 (L) 22 - 32 mmol/L   Hemoglobin 7.5 (L) 13.0 - 17.0 g/dL   HCT 22.0 (L) 39.0 - 52.0 %  SARS Coronavirus 2 (CEPHEID - Performed in [place] hospital lab), Hosp Order     Status: None   Collection Time: 07/31/18  4:56 PM   Specimen: Nasopharyngeal Swab  Result Value Ref Range   SARS Coronavirus 2 NEGATIVE NEGATIVE    Comment: (NOTE) If result is NEGATIVE SARS-CoV-2 target nucleic acids are NOT DETECTED. The SARS-CoV-2 RNA is generally detectable in upper and lower  respiratory specimens during the acute phase of infection. The lowest  concentration of SARS-CoV-2 viral copies this assay can detect is 250  copies / mL. A negative result does not preclude SARS-CoV-2 infection  and should not be used as the sole basis for treatment or other  patient management decisions.  A negative result may occur with  improper specimen collection / handling, submission of specimen other  than nasopharyngeal swab, presence of viral mutation(s) within the  areas targeted by this assay, and inadequate number of viral copies  (<250 copies / mL). A negative result must be combined with clinical  observations, patient history, and epidemiological information. If result is POSITIVE SARS-CoV-2 target nucleic acids are DETECTED. The SARS-CoV-2 RNA is generally detectable in upper and lower  respiratory specimens dur ing the acute phase of infection.  Positive  results are indicative of active infection with SARS-CoV-2.  Clinical  correlation with patient history and other diagnostic information is  necessary to determine  patient infection status.  Positive results do  not rule out bacterial infection or co-infection with other viruses. If result is PRESUMPTIVE POSTIVE SARS-CoV-2 nucleic acids MAY BE PRESENT.   A presumptive positive result was obtained on the submitted specimen  and confirmed on repeat testing.  While 2019 novel coronavirus  (SARS-CoV-2) nucleic acids may be present in the submitted sample  additional confirmatory testing may be necessary for epidemiological  and / or clinical management purposes  to differentiate between  SARS-CoV-2 and other Sarbecovirus currently known to infect humans.  If clinically indicated additional testing with an alternate test  methodology [id number]) is advised. The SARS-CoV-2 RNA is generally  detectable in upper and lower respiratory sp ecimens during the acute  phase of infection. The expected result is Negative. Fact Sheet for Patients:  StrictlyIdeas.no Fact Sheet for Healthcare Providers: BankingDealers.co.za This test is not yet approved or cleared by the [place] FDA and has been authorized for detection and/or diagnosis of SARS-CoV-2 by FDA under an Emergency Use Authorization (EUA).  This EUA will remain in effect (meaning this test can be used) for the duration of the COVID-19 declaration under Section 564(b)(1) of the Act, 21 U.S.C. section 360bbb-3(b)(1), unless the authorization is terminated or revoked sooner. Performed at [facility], [place] [address] [place], [place] [address]   Type and screen [place]     Status: None (Preliminary result)   Collection Time: 07/31/18  4:58 PM  Result Value Ref  Range   ABO/RH(D) B NEG    Antibody Screen NEG    Sample Expiration 08/03/2018,2359    Unit Number [id number]    Blood Component Type RED CELLS,LR    Unit division 00    Status of Unit ISSUED    Transfusion Status OK TO TRANSFUSE    Crossmatch  Result      Compatible Performed at [place] [address] [place], [place] [address]    Unit Number [id number]    Blood Component Type RED CELLS,LR    Unit division 00    Status of Unit ALLOCATED    Transfusion Status OK TO TRANSFUSE    Crossmatch Result Compatible   POC occult blood, ED Provider will collect     Status: None   Collection Time: 07/31/18  6:28 PM  Result Value Ref Range   Fecal Occult Bld NEGATIVE NEGATIVE  Prepare RBC     Status: None   Collection Time: 07/31/18  7:00 PM  Result Value Ref Range   Order Confirmation      ORDER PROCESSED BY BLOOD BANK Performed at [facility], [place] [address] [place], [place] [address]    Ct Angio Head W Or Wo Contrast  Result Date: 07/31/2018 CLINICAL DATA:  Focal neuro deficit. Mental status. Difficulty following words. EXAM: CT ANGIOGRAPHY HEAD AND NECK TECHNIQUE: Multidetector CT imaging of the head and neck was performed using the standard protocol during bolus administration of intravenous contrast. Multiplanar CT image reconstructions and MIPs were obtained to evaluate the vascular anatomy. Carotid stenosis measurements (when applicable) are obtained utilizing NASCET criteria, using the distal internal carotid diameter as the denominator. CONTRAST:  47m OMNIPAQUE IOHEXOL 350 MG/ML SOLN COMPARISON:  CT head without contrast 07/31/2018 FINDINGS: CTA NECK FINDINGS Aortic arch: There is a common origin of the left common carotid artery and the innominate artery. Atherosclerotic changes are present the aortic arch without aneurysm. The left vertebral artery originates directly from the arch, distal to the left subclavian artery. Right carotid system: The right common carotid artery is within normal limits. Atherosclerotic irregularity is present in proximal right ICA without a significant stenosis. More distal cervical right ICA is normal. Left carotid system: The left common carotid artery  is within normal limits. Bulky calcifications are present along the medial aspect of the proximal left ICA without significant stenosis relative to the more distal vessel. The distal left ICA is normal. Vertebral arteries: The vertebral arteries are codominant. Atherosclerotic calcifications are present at the origin of the right vertebral artery from the subclavian artery without significant stenosis. There is no significant stenosis in either vertebral artery in the neck. Skeleton: Slight degenerative anterolisthesis is present at C3-4. Mild endplate changes are present most notably at C2-3 and C3-4. No focal lytic or blastic lesions are present. Other neck: No focal mucosal or submucosal lesions are present. Cords are midline and symmetric. A 10 mm incidental left thyroid nodule is noted posteriorly. Thyroid is otherwise mildly atrophic. The salivary glands are within normal limits. No significant cervical adenopathy is present Upper chest: Lung apices are clear without focal nodule, mass, or airspace disease. Thoracic inlet is normal. Review of the MIP images confirms the above findings CTA HEAD FINDINGS Anterior circulation: Dense calcifications are present in the cavernous internal carotid arteries bilaterally without significant stenosis through the ICA termini. Distal ICA is normal bilaterally. The A1 and M1 segments are normal. The anterior communicating artery is patent. The MCA bifurcations are within normal  limits. There is segmental irregularity and distal ACA and MCA branch vessels without a significant proximal stenosis or occlusion. Posterior circulation: Dense atherosclerotic calcifications are present at the dural margin the right vertebral artery without a significant stenosis. PICA origins are visualized and normal. Basilar artery is normal. Both posterior cerebral arteries originate from the basilar tip. There is some irregularity of distal PCA branch vessels without a significant proximal  stenosis or occlusion. Venous sinuses: The dural sinuses are patent. The right transverse sinus is dominant. The straight sinus deep cerebral veins are intact. Cortical veins are unremarkable. Anatomic variants: None Review of the MIP images confirms the above findings IMPRESSION: 1. No emergent large vessel occlusion. 2. Mild distal small vessel irregularity without a significant proximal stenosis, aneurysm, or branch vessel occlusion within the circle-of-Willis. 3. Atherosclerotic changes involve the carotid bifurcations bilaterally, the origins of the vertebral arteries bilaterally, and the cavernous internal carotid arteries bilaterally without significant stenosis relative to the more distal vessels. 4.  Aortic Atherosclerosis (ICD10-I70.0). Electronically Signed   By: San Morelle M.D.   On: 07/31/2018 17:40   Ct Angio Neck W And/or Wo Contrast  Result Date: 07/31/2018 CLINICAL DATA:  Focal neuro deficit. Mental status. Difficulty following words. EXAM: CT ANGIOGRAPHY HEAD AND NECK TECHNIQUE: Multidetector CT imaging of the head and neck was performed using the standard protocol during bolus administration of intravenous contrast. Multiplanar CT image reconstructions and MIPs were obtained to evaluate the vascular anatomy. Carotid stenosis measurements (when applicable) are obtained utilizing NASCET criteria, using the distal internal carotid diameter as the denominator. CONTRAST:  58m OMNIPAQUE IOHEXOL 350 MG/ML SOLN COMPARISON:  CT head without contrast 07/31/2018 FINDINGS: CTA NECK FINDINGS Aortic arch: There is a common origin of the left common carotid artery and the innominate artery. Atherosclerotic changes are present the aortic arch without aneurysm. The left vertebral artery originates directly from the arch, distal to the left subclavian artery. Right carotid system: The right common carotid artery is within normal limits. Atherosclerotic irregularity is present in proximal right ICA  without a significant stenosis. More distal cervical right ICA is normal. Left carotid system: The left common carotid artery is within normal limits. Bulky calcifications are present along the medial aspect of the proximal left ICA without significant stenosis relative to the more distal vessel. The distal left ICA is normal. Vertebral arteries: The vertebral arteries are codominant. Atherosclerotic calcifications are present at the origin of the right vertebral artery from the subclavian artery without significant stenosis. There is no significant stenosis in either vertebral artery in the neck. Skeleton: Slight degenerative anterolisthesis is present at C3-4. Mild endplate changes are present most notably at C2-3 and C3-4. No focal lytic or blastic lesions are present. Other neck: No focal mucosal or submucosal lesions are present. Cords are midline and symmetric. A 10 mm incidental left thyroid nodule is noted posteriorly. Thyroid is otherwise mildly atrophic. The salivary glands are within normal limits. No significant cervical adenopathy is present Upper chest: Lung apices are clear without focal nodule, mass, or airspace disease. Thoracic inlet is normal. Review of the MIP images confirms the above findings CTA HEAD FINDINGS Anterior circulation: Dense calcifications are present in the cavernous internal carotid arteries bilaterally without significant stenosis through the ICA termini. Distal ICA is normal bilaterally. The A1 and M1 segments are normal. The anterior communicating artery is patent. The MCA bifurcations are within normal limits. There is segmental irregularity and distal ACA and MCA branch vessels without a significant proximal stenosis  or occlusion. Posterior circulation: Dense atherosclerotic calcifications are present at the dural margin the right vertebral artery without a significant stenosis. PICA origins are visualized and normal. Basilar artery is normal. Both posterior cerebral  arteries originate from the basilar tip. There is some irregularity of distal PCA branch vessels without a significant proximal stenosis or occlusion. Venous sinuses: The dural sinuses are patent. The right transverse sinus is dominant. The straight sinus deep cerebral veins are intact. Cortical veins are unremarkable. Anatomic variants: None Review of the MIP images confirms the above findings IMPRESSION: 1. No emergent large vessel occlusion. 2. Mild distal small vessel irregularity without a significant proximal stenosis, aneurysm, or branch vessel occlusion within the circle-of-Willis. 3. Atherosclerotic changes involve the carotid bifurcations bilaterally, the origins of the vertebral arteries bilaterally, and the cavernous internal carotid arteries bilaterally without significant stenosis relative to the more distal vessels. 4.  Aortic Atherosclerosis (ICD10-I70.0). Electronically Signed   By: San Morelle M.D.   On: 07/31/2018 17:40   Mr Brain Wo Contrast  Result Date: 07/31/2018 CLINICAL DATA:  Initial evaluation for acute speech difficulty, confusion. EXAM: MRI HEAD WITHOUT CONTRAST TECHNIQUE: Multiplanar, multiecho pulse sequences of the brain and surrounding structures were obtained without intravenous contrast. COMPARISON:  Prior head CT from earlier the same day. FINDINGS: Brain: Examination moderately degraded by motion artifact. Diffuse prominence of the CSF containing spaces compatible with generalized age-related cerebral atrophy. Patchy T2/FLAIR hyperintensity within the periventricular and deep white matter both cerebral hemispheres most consistent with chronic microvascular ischemic disease, mild for age. No abnormal foci of restricted diffusion to suggest acute or subacute ischemia. Gray-white matter differentiation maintained. No encephalomalacia to suggest chronic cortical infarction. No foci of susceptibility artifact to suggest acute or chronic intracranial hemorrhage. No mass  lesion, midline shift or mass effect. No hydrocephalus. No extra-axial fluid collection. Pituitary gland suprasellar region within normal limits. Midline structures intact and normal. Vascular: Major intracranial vascular flow voids are maintained. Skull and upper cervical spine: Degenerative thickening noted about the tectorial membrane without significant stenosis. Craniocervical junction otherwise unremarkable. Bone marrow signal intensity within normal limits. No scalp soft tissue abnormality. Sinuses/Orbits: Globes and orbital soft tissues within normal limits. Chronic right maxillary sinusitis noted. Paranasal sinuses are otherwise clear. No mastoid effusion. Inner ear structures normal. Other: None. IMPRESSION: 1. No acute intracranial abnormality. 2. Mild age-related cerebral atrophy with chronic microvascular ischemic disease. 3. Chronic right maxillary sinusitis. Electronically Signed   By: Jeannine Boga M.D.   On: 07/31/2018 20:55   Ct Head Code Stroke Wo Contrast  Addendum Date: 07/31/2018   ADDENDUM REPORT: 07/31/2018 18:26 ADDENDUM: Voice recognition error: The second sentence of the impression section should read "No acute intracranial abnormality. " Electronically Signed   By: San Morelle M.D.   On: 07/31/2018 18:26   Result Date: 07/31/2018 CLINICAL DATA:  Code stroke. Unable to speak beginning at 12:30 p.m. today. EXAM: CT HEAD WITHOUT CONTRAST TECHNIQUE: Contiguous axial images were obtained from the base of the skull through the vertex without intravenous contrast. COMPARISON:  None FINDINGS: Brain: Moderate atrophy and white matter disease is present bilaterally. Basal ganglia are intact. Insular ribbon is normal. Acute or focal cortical abnormalities are present. The ventricles are proportionate to the degree of atrophy. No significant extraaxial fluid collection is present. Vascular: Atherosclerotic calcifications are present within the cavernous internal carotid arteries  and at the dural margin of the right vertebral artery. There is no hyperdense vessel. Skull: Calvarium is intact. No focal lytic or  blastic lesions are present. Sinuses/Orbits: Chronic right maxillary sinus disease is present. The paranasal sinuses and mastoid air cells are clear. The globes and orbits are within normal limits. ASPECTS [facility] Stroke Program Early CT Score) - Ganglionic level infarction (caudate, lentiform nuclei, internal capsule, insula, M1-M3 cortex): 7/7 - Supraganglionic infarction (M4-M6 cortex): 3/3 Total score (0-10 with 10 being normal): 10/10 IMPRESSION: 1. Moderate diffuse atrophy and white matter disease. This likely reflects the sequela of chronic microvascular ischemia. 2. Acute intracranial abnormality. 3. ASPECTS is 10/10 4. Chronic right maxillary sinus disease. These results were called by telephone at the time of interpretation on 07/31/2018 at 4:39 pm to Dr. Isla Pence , who verbally acknowledged these results. Electronically Signed: By: San Morelle M.D. On: 07/31/2018 16:40    Pending Labs Unresulted Labs (From admission, onward)    Start     Ordered   07/31/18 2126  Vitamin B12  Add-on,   AD     07/31/18 2125   07/31/18 2122  Ferritin  Add-on,   AD     07/31/18 2121   07/31/18 2104  Urinalysis, Routine w reflex microscopic  ONCE - STAT,   STAT     07/31/18 2103   07/31/18 2104  Urine culture  ONCE - STAT,   STAT     07/31/18 2103          Vitals/Pain Today's Vitals   07/31/18 2057 07/31/18 2058 07/31/18 2111 07/31/18 2122  BP: 133/85 133/85 139/60 (!) 143/58  Pulse: (!) 103 (!) 106 (!) 105 (!) 105  Resp: 20 (!) 21 19 (!) 23  Temp:  99.4 F (37.4 C)  99.7 F (37.6 C)  TempSrc:  Oral  Oral  SpO2: 96% 97% 97%   Weight:      Height:        Isolation Precautions No active isolations  Medications Medications  0.9 %  sodium chloride infusion (Manually program via Guardrails IV Fluids) (has no administration in time range)  sodium  chloride (PF) 0.9 % injection (  Given by Other 07/31/18 1703)  iohexol (OMNIPAQUE) 350 MG/ML injection 80 mL (80 mLs Intravenous Contrast Given 07/31/18 1708)    Mobility manual wheelchair

## 2018-07-31 NOTE — ED Notes (Signed)
Pt and wife aware that urine sample is needed.

## 2018-07-31 NOTE — ED Notes (Signed)
Patient transported to CT 

## 2018-07-31 NOTE — ED Notes (Addendum)
Patient transported to MRI. Will administer blood when pt returns

## 2018-07-31 NOTE — ED Triage Notes (Signed)
Pt presents to ED via POV with wife. Wife reports that around 1230 today pt became unable to speak, follow commands and was confused. Pt has no hx of same.

## 2018-08-01 ENCOUNTER — Ambulatory Visit (HOSPITAL_COMMUNITY): Payer: Medicare Other

## 2018-08-01 ENCOUNTER — Encounter (HOSPITAL_COMMUNITY): Payer: Self-pay

## 2018-08-01 ENCOUNTER — Telehealth: Payer: Self-pay | Admitting: *Deleted

## 2018-08-01 ENCOUNTER — Observation Stay (HOSPITAL_BASED_OUTPATIENT_CLINIC_OR_DEPARTMENT_OTHER): Payer: Medicare Other

## 2018-08-01 DIAGNOSIS — R4182 Altered mental status, unspecified: Secondary | ICD-10-CM | POA: Diagnosis not present

## 2018-08-01 DIAGNOSIS — C189 Malignant neoplasm of colon, unspecified: Secondary | ICD-10-CM

## 2018-08-01 DIAGNOSIS — I10 Essential (primary) hypertension: Secondary | ICD-10-CM

## 2018-08-01 DIAGNOSIS — R4781 Slurred speech: Secondary | ICD-10-CM | POA: Diagnosis not present

## 2018-08-01 DIAGNOSIS — R4701 Aphasia: Secondary | ICD-10-CM | POA: Diagnosis not present

## 2018-08-01 DIAGNOSIS — D509 Iron deficiency anemia, unspecified: Secondary | ICD-10-CM | POA: Diagnosis not present

## 2018-08-01 DIAGNOSIS — I700 Atherosclerosis of aorta: Secondary | ICD-10-CM | POA: Diagnosis present

## 2018-08-01 DIAGNOSIS — E876 Hypokalemia: Secondary | ICD-10-CM | POA: Diagnosis present

## 2018-08-01 DIAGNOSIS — I340 Nonrheumatic mitral (valve) insufficiency: Secondary | ICD-10-CM | POA: Diagnosis not present

## 2018-08-01 DIAGNOSIS — C186 Malignant neoplasm of descending colon: Secondary | ICD-10-CM | POA: Diagnosis not present

## 2018-08-01 DIAGNOSIS — D649 Anemia, unspecified: Secondary | ICD-10-CM | POA: Diagnosis not present

## 2018-08-01 DIAGNOSIS — Z87442 Personal history of urinary calculi: Secondary | ICD-10-CM | POA: Diagnosis not present

## 2018-08-01 DIAGNOSIS — D500 Iron deficiency anemia secondary to blood loss (chronic): Secondary | ICD-10-CM | POA: Diagnosis not present

## 2018-08-01 DIAGNOSIS — Z79899 Other long term (current) drug therapy: Secondary | ICD-10-CM | POA: Diagnosis not present

## 2018-08-01 DIAGNOSIS — C799 Secondary malignant neoplasm of unspecified site: Secondary | ICD-10-CM | POA: Diagnosis not present

## 2018-08-01 DIAGNOSIS — G459 Transient cerebral ischemic attack, unspecified: Principal | ICD-10-CM

## 2018-08-01 DIAGNOSIS — Z8249 Family history of ischemic heart disease and other diseases of the circulatory system: Secondary | ICD-10-CM | POA: Diagnosis not present

## 2018-08-01 DIAGNOSIS — E860 Dehydration: Secondary | ICD-10-CM | POA: Diagnosis present

## 2018-08-01 DIAGNOSIS — Z85038 Personal history of other malignant neoplasm of large intestine: Secondary | ICD-10-CM | POA: Diagnosis not present

## 2018-08-01 DIAGNOSIS — R1902 Left upper quadrant abdominal swelling, mass and lump: Secondary | ICD-10-CM | POA: Diagnosis present

## 2018-08-01 DIAGNOSIS — Z9049 Acquired absence of other specified parts of digestive tract: Secondary | ICD-10-CM | POA: Diagnosis not present

## 2018-08-01 DIAGNOSIS — R29708 NIHSS score 8: Secondary | ICD-10-CM | POA: Diagnosis present

## 2018-08-01 DIAGNOSIS — Z20828 Contact with and (suspected) exposure to other viral communicable diseases: Secondary | ICD-10-CM | POA: Diagnosis present

## 2018-08-01 DIAGNOSIS — Z7901 Long term (current) use of anticoagulants: Secondary | ICD-10-CM | POA: Diagnosis not present

## 2018-08-01 DIAGNOSIS — I749 Embolism and thrombosis of unspecified artery: Secondary | ICD-10-CM | POA: Diagnosis not present

## 2018-08-01 DIAGNOSIS — G9341 Metabolic encephalopathy: Secondary | ICD-10-CM | POA: Diagnosis present

## 2018-08-01 DIAGNOSIS — R471 Dysarthria and anarthria: Secondary | ICD-10-CM | POA: Diagnosis present

## 2018-08-01 DIAGNOSIS — K5190 Ulcerative colitis, unspecified, without complications: Secondary | ICD-10-CM | POA: Diagnosis present

## 2018-08-01 DIAGNOSIS — Z66 Do not resuscitate: Secondary | ICD-10-CM | POA: Diagnosis present

## 2018-08-01 DIAGNOSIS — I739 Peripheral vascular disease, unspecified: Secondary | ICD-10-CM | POA: Diagnosis present

## 2018-08-01 DIAGNOSIS — Z90411 Acquired partial absence of pancreas: Secondary | ICD-10-CM | POA: Diagnosis not present

## 2018-08-01 DIAGNOSIS — D638 Anemia in other chronic diseases classified elsewhere: Secondary | ICD-10-CM | POA: Diagnosis present

## 2018-08-01 LAB — CBC
HCT: 30.4 % — ABNORMAL LOW (ref 39.0–52.0)
Hemoglobin: 9.3 g/dL — ABNORMAL LOW (ref 13.0–17.0)
MCH: 23.5 pg — ABNORMAL LOW (ref 26.0–34.0)
MCHC: 30.6 g/dL (ref 30.0–36.0)
MCV: 76.8 fL — ABNORMAL LOW (ref 80.0–100.0)
Platelets: 427 10*3/uL — ABNORMAL HIGH (ref 150–400)
RBC: 3.96 MIL/uL — ABNORMAL LOW (ref 4.22–5.81)
RDW: 15.9 % — ABNORMAL HIGH (ref 11.5–15.5)
WBC: 16.1 10*3/uL — ABNORMAL HIGH (ref 4.0–10.5)
nRBC: 0 % (ref 0.0–0.2)

## 2018-08-01 LAB — HEMOGLOBIN A1C
Hgb A1c MFr Bld: 5.9 % — ABNORMAL HIGH (ref 4.8–5.6)
Mean Plasma Glucose: 122.63 mg/dL

## 2018-08-01 LAB — LIPID PANEL
Cholesterol: 93 mg/dL (ref 0–200)
HDL: 18 mg/dL — ABNORMAL LOW (ref >40)
LDL Cholesterol: 55 mg/dL (ref 0–99)
Total CHOL/HDL Ratio: 5.2 RATIO
Triglycerides: 101 mg/dL (ref <150)
VLDL: 20 mg/dL (ref 0–40)

## 2018-08-01 LAB — URINALYSIS, ROUTINE W REFLEX MICROSCOPIC
Bilirubin Urine: NEGATIVE
Glucose, UA: NEGATIVE mg/dL
Hgb urine dipstick: NEGATIVE
Ketones, ur: NEGATIVE mg/dL
Leukocytes,Ua: NEGATIVE
Nitrite: NEGATIVE
Protein, ur: NEGATIVE mg/dL
Specific Gravity, Urine: 1.039 — ABNORMAL HIGH (ref 1.005–1.030)
pH: 6 (ref 5.0–8.0)

## 2018-08-01 LAB — FERRITIN: Ferritin: 14 ng/mL — ABNORMAL LOW (ref 24–336)

## 2018-08-01 LAB — ECHOCARDIOGRAM COMPLETE
Height: 70 in
Weight: 2448 oz

## 2018-08-01 LAB — VITAMIN B12: Vitamin B-12: 694 pg/mL (ref 180–914)

## 2018-08-01 MED ORDER — BISACODYL 10 MG RE SUPP: 10.0000 mg | Freq: Once | RECTAL | Status: DC

## 2018-08-01 MED ORDER — POLYETHYLENE GLYCOL 3350 17 G PO PACK: 17.0000 g | PACK | Freq: Every day | ORAL | Status: DC | PRN

## 2018-08-01 MED ORDER — ONDANSETRON HCL 4 MG/2ML IJ SOLN
4.0000 mg | Freq: Three times a day (TID) | INTRAMUSCULAR | Status: DC | PRN
  Administered 2018-08-01 (×3): 4 mg via INTRAVENOUS
  Filled 2018-08-01 (×3): qty 2

## 2018-08-01 NOTE — Evaluation (Signed)
Occupational Therapy Evaluation Patient Details Name: Cody Suarez. MRN: [id number] DOB: [birth year] Today's Date: 08/01/2018    History of Present Illness Cody Suarez. is a 73 y.o. 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 Pt currently with functional limitations due to the deficits listed below (see OT Problem List).  Pt will benefit from skilled OT to increase their safety and independence with ADL and functional mobility for ADL to facilitate discharge to venue listed below.      Follow Up Recommendations  Home health OT;Supervision/Assistance - 24 hour    Equipment Recommendations  None recommended by OT    Recommendations for Other Services       Precautions / Restrictions Precautions Precautions: Fall      Mobility Bed Mobility Overal bed mobility: Needs Assistance Bed Mobility: Supine to Sit     Supine to sit: Min guard        Transfers Overall transfer level: Needs assistance Equipment used: Rolling walker (2 wheeled) Transfers: Sit to/from [facility] Sit to Stand: Min guard Stand pivot transfers: Min guard                ADL either performed or assessed with clinical judgement   ADL Overall ADL's : Needs assistance/impaired Eating/Feeding: Set up;Sitting   Grooming: Set up;Sitting   Upper Body Bathing: Minimal assistance;Sitting   Lower Body Bathing: Minimal assistance;Sit to/from stand   Upper Body Dressing : Set up;Sitting   Lower Body Dressing: Minimal assistance;Sit to/from stand;Cueing for safety;Cueing for sequencing   Toilet Transfer: Min guard;RW;Comfort height toilet;Ambulation   Toileting- Clothing  Manipulation and Hygiene: Minimal assistance;Sit to/from stand         General ADL Comments: wife provides A with ADL activity as needed but at baseline pt is mod I     Vision Patient Visual Report: No change from baseline              Pertinent Vitals/Pain Pain Assessment: No/denies pain     Hand Dominance Right   Extremity/Trunk Assessment Upper Extremity Assessment Upper Extremity Assessment: Generalized weakness           Communication Communication Communication: HOH   Cognition Arousal/Alertness: Awake/alert Behavior During Therapy: WFL for tasks assessed/performed Overall Cognitive Status: Within Functional Limits for tasks assessed                                                Home Living Family/patient expects to be discharged to:: Private residence Living Arrangements: Spouse/significant other Available Help at Discharge: Family Type of Home: House Home Access: Stairs to enter [employer] of Steps: 3 back with railings Entrance Stairs-Rails: Can reach both Home Layout: One level     Bathroom Shower/Tub: Teacher, early years/pre: Standard     Home Equipment: Environmental consultant - 2 wheels          Prior Functioning/Environment Level of Independence: Independent                 OT Problem List: Decreased strength      OT Treatment/Interventions: Self-care/ADL training;Patient/family education;Therapeutic activities  OT Goals(Current goals can be found in the care plan section) Acute Rehab OT Goals Patient Stated Goal: home with wife OT Goal Formulation: With patient Time For Goal Achievement: 08/08/18 Potential to Achieve Goals: Good  OT Frequency: Min 2X/week   Barriers to D/C:               AM-PAC OT "6 Clicks" Daily Activity     Outcome Measure Help from another person eating meals?: None Help from another person taking care of personal grooming?: None Help from another person toileting,  which includes using toliet, bedpan, or urinal?: A Little Help from another person bathing (including washing, rinsing, drying)?: A Little Help from another person to put on and taking off regular upper body clothing?: None Help from another person to put on and taking off regular lower body clothing?: A Little 6 Click Score: 21   End of Session Equipment Utilized During Treatment: Gait belt;Rolling walker Nurse Communication: Mobility status  Activity Tolerance: Patient tolerated treatment well Patient left: in chair;with chair alarm set  OT Visit Diagnosis: Unsteadiness on feet (R26.81);Muscle weakness (generalized) (M62.81)                Time: 7493-5521 OT Time Calculation (min): 20 min Charges:  OT General Charges $OT Visit: 1 Visit OT Evaluation $OT Eval Low Complexity: 1 Low  Kari Baars, OT Acute Rehabilitation Services [phone] Office- [phone]     Porsche Noguchi, Edwena Felty D 08/01/2018, 1:51 PM

## 2018-08-01 NOTE — Care Management Obs Status (Signed)
[place] NOTIFICATION   Patient Details  Name: Cody Suarez. MRN: [id number] Date of Birth: 10-09-1945   Medicare Observation Status Notification Given:  Yes    Joaquin Courts, RN 08/01/2018, 3:17 PM

## 2018-08-01 NOTE — Progress Notes (Signed)
[facility] Progress Note  Subjective: seen in room, had BM earlier.  No more difficulty w/ ability to speak or to understand spoken words  Vitals:   08/01/18 0325 08/01/18 0625 08/01/18 0908 08/01/18 1529  BP: (!) 122/56 123/64 118/60 133/65  Pulse: 80 80 79 76  Resp: 14 16 16 16   Temp: 98.2 F (36.8 C) 98.4 F (36.9 C) 98 F (36.7 C) 98.5 F (36.9 C)  TempSrc: Oral Oral Oral Oral  SpO2: 98% 98% 97% 100%  Weight:      Height:        Inpatient medications: . bisacodyl  10 mg Rectal Once  . losartan  50 mg Oral Daily  . spironolactone  25 mg Oral Daily  . sulfaSALAzine  1,500 mg Oral Daily    acetaminophen **OR** acetaminophen (TYLENOL) oral liquid 160 mg/5 mL **OR** acetaminophen, HYDROcodone-acetaminophen, ondansetron (ZOFRAN) IV, polyethylene glycol, zolpidem  Exam:  pleasant Ox 3, chron ill appearing, no distress  lying flat  no jvd  Chest cta bilat  Cor reg no mrg  Abd soft ntnd no mass or ascite  ext no LE or UE edema  Neuro CN 2-12 grossly intact. Sensation intact, Strength 5/5 in both upper extremities, 4/5 in both lower extremities. Psychiatric: alert, oriented x 3 and nonfocal  HPI: Cody Suarez. is a 73 y.o. 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  History is limited from patient and is supplemented by wife at bedside, EDP, and chart review. Per wife, patient has had poor oral intake over the last 2 weeks with frequent nausea which has been somewhat relieved with Zofran as needed.  She states that he has a history of ulcerative colitis most commonly associated with loose stools however recently has had constipation.  She says after lunch on 07/31/2018 he had sudden alteration of mental status with inability to  speak.  She felt as if he was trying to speak with but was unable to.  She is not sure if he was able to understand her.  She called EMS due to concern for stroke however on their arrival when asked if it was okay to bring him to the hospital, he said no therefore was not brought immediately to the ED.  Patient's daughter-in-law who works in healthcare came to see him and again recommended further evaluation in the emergency department at which time he was agreeable and was brought by his wife via personal vehicle.  Code stroke was called immediately upon arrival. At time of admission evaluation patient is able to speak and follow commands.  He does not recall the episode earlier in the day but remembers getting into the car from his home.  He is on Xarelto for history of arterial thromboembolism.  He denies any obvious bleeding including epistaxis, hemoptysis, hematemesis, hematuria, hematochezia, or melena.  He currently is feeling very thirsty.  He denies any chest pain, dyspnea, abdominal pain, dysuria, or diarrhea. Patient had a CT abdomen/pelvis with contrast on 07/21/2018 which showed interval increase in size of left upper quadrant gastrosplenic ligament mass and findings suspicious for progressive tumor infiltration or vascular compromise of the upper spleen. Patient was planned to have Port-A-Cath placement on August 10 in anticipation of starting chemotherapy with FOLFOX.  ED Course:  Initial vitals showed BP 124/73, pulse 112, RR  23, temp 99.2 Fahrenheit, SPO2 100% on room air.  Labs are notable for WBC 17.5, hemoglobin 6.2 (10.5 on 04/27/2018), MCV 71.6, hematocrit 20.7, platelets 587,000, sodium 134, potassium 3.4, bicarb 17, BUN 11, creatinine 1.22. FOBT was negative.  SARS-CoV-2 test was negative.  Patient arrived as code stroke.  Initial CT head without contrast was negative for acute intracranial abnormality, moderate diffuse atrophy and white matter disease was noted.  CTA head and neck were  negative for emergent large vessel occlusion.  Mild distal small vessel irregularity without significant proximal stenosis, aneurysm, or branch vessel occlusion within the circle of Willis was noted. Telemetry neurology were consulted.  Patient was not considered TPA candidate due to being on Xarelto with last dose 07/30/2018.  MRI brain was recommended and obtained which did not show any acute intracranial abnormality or infarct. Patient was started on transfusion of 2 units PRBCs.  The hospitalist service was consulted to admit for further evaluation and management.   Hospital Course:  Transient speech abnormality: Possible TIA versus metabolic encephalopathy in setting of anemia and dehydration.  Symptoms improved back to baseline while in the ED.  CTA head/neck and MRI brain negative for large vessel occlusion or acute infarct respectively. - awaiting results of echocardiogram and carotid dopplers  - PT/OT/SLP eval in progress - Gave 1 L normal saline - symptoms resolved, speaking and understanding w/o issue today  Microcytic anemia: Hemoglobin  on admission.  No obvious bleeding source, FOBT is negative.  He has been on 6.2 Xarelto for history of arterial thromboembolism.  Suspect iron deficiency. - pt recevied 2 units PRBC's and Hb up to 9.3 - FOBT is negative - await rec's from ONC  Metastatic colon cancer: Follows with oncology, Dr. Benay Spice.  Recent CT imaging shows enlarging left upper quadrant mass and possible new lung nodule.  Patient was planned for Port-A-Cath placement at the end of this week with plan to begin FOLFOX in the next 4 to 6 weeks  History of arterial thromboembolism: Holding Xarelto with anemia Await ONC assessment tomorrow  Hypokalemia: - repeat in am, sp PO Kcl  Hypertension: - continue home losartan and spironolactone.  Ulcerative colitis: - currently stable, continue home sulfasalazine.  DVT prophylaxis: SCDs Code Status: DNR/DNI Family  Communication: none today Disposition Plan: Pending clinical progress Consults called: ONC to see pt tomorrow Admission status: Observation   Kelly Splinter / [facility] [phone] 08/01/2018, 4:21 PM   Recent Labs  Lab 07/31/18 1643 07/31/18 1650  NA 134* 135  K 3.4* 3.5  CL 103 104  CO2 17*  --   GLUCOSE 134* 132*  BUN 11 8  CREATININE 1.22 1.00  CALCIUM 9.0  --    Recent Labs  Lab 07/31/18 1643  AST 18  ALT 6  ALKPHOS 76  BILITOT 0.5  PROT 7.9  ALBUMIN 3.4*   Recent Labs  Lab 07/31/18 1643 07/31/18 1650 08/01/18 1339  WBC 17.5*  --  16.1*  NEUTROABS 14.1*  --   --   HGB 6.2* 7.5* 9.3*  HCT 20.7* 22.0* 30.4*  MCV 71.6*  --  76.8*  PLT 587*  --  427*   Iron/TIBC/Ferritin/ %Sat    Component Value Date/Time   IRON 20 (L) 09/07/2014 0530   TIBC 339 09/07/2014 0530   FERRITIN 14 (L) 08/01/2018 0445   IRONPCTSAT 6 (L) 09/07/2014 0530

## 2018-08-01 NOTE — Plan of Care (Signed)
  Problem: Acute Rehab PT Goals(only PT should resolve) Goal: Pt Will Go Supine/Side To Sit 08/01/2018 1721 by Jacques Navy, PT Outcome: Progressing Flowsheets (Taken 08/01/2018 1721) Pt will go Supine/Side to Sit: Independently 08/01/2018 1458 by Jacques Navy, PT Outcome: Progressing Flowsheets (Taken 08/01/2018 1458) Pt will go Supine/Side to Sit: with modified independence Goal: Patient Will Transfer Sit To/From Stand 08/01/2018 1721 by Jacques Navy, PT Outcome: Progressing Flowsheets (Taken 08/01/2018 1721) Patient will transfer sit to/from stand: Independently 08/01/2018 1458 by Jacques Navy, PT Outcome: Progressing Flowsheets (Taken 08/01/2018 1458) Patient will transfer sit to/from stand: with supervision Goal: Pt Will Transfer Bed To Chair/Chair To Bed 08/01/2018 1721 by Jacques Navy, PT Outcome: Progressing Flowsheets (Taken 08/01/2018 1721) Pt will Transfer Bed to Chair/Chair to Bed: Independently 08/01/2018 1458 by Jacques Navy, PT Outcome: Progressing Flowsheets (Taken 08/01/2018 1458) Pt will Transfer Bed to Chair/Chair to Bed: with supervision Goal: Pt Will Ambulate 08/01/2018 1721 by Jacques Navy, PT Outcome: Progressing Flowsheets (Taken 08/01/2018 1721) Pt will Ambulate:  > 125 feet  with modified independence 08/01/2018 1458 by Jacques Navy, PT Outcome: Progressing Flowsheets (Taken 08/01/2018 1458) Pt will Ambulate:  100 feet  with min guard assist  with least restrictive assistive device

## 2018-08-01 NOTE — Plan of Care (Signed)
  Problem: Acute Rehab PT Goals(only PT should resolve) Goal: Pt Will Go Supine/Side To Sit Outcome: Progressing Flowsheets (Taken 08/01/2018 1458) Pt will go Supine/Side to Sit: with modified independence Goal: Patient Will Transfer Sit To/From Stand Outcome: Progressing Flowsheets (Taken 08/01/2018 1458) Patient will transfer sit to/from stand: with supervision Goal: Pt Will Transfer Bed To Chair/Chair To Bed Outcome: Progressing Flowsheets (Taken 08/01/2018 1458) Pt will Transfer Bed to Chair/Chair to Bed: with supervision Goal: Pt Will Ambulate Outcome: Progressing Flowsheets (Taken 08/01/2018 1458) Pt will Ambulate:  100 feet  with min guard assist  with least restrictive assistive device

## 2018-08-01 NOTE — Progress Notes (Signed)
  Echocardiogram 2D Echocardiogram has been performed.  Cody Suarez L Androw 08/01/2018, 1:30 PM

## 2018-08-01 NOTE — Evaluation (Signed)
Speech Language Pathology Evaluation Patient Details Name: Cody Suarez. MRN: [id number] DOB: 1945-06-12 Today's Date: 08/01/2018 Time: 2876-8115 SLP Time Calculation (min) (ACUTE ONLY): 30 min  Problem List:  Patient Active Problem List   Diagnosis Date Noted  . TIA (transient ischemic attack) 08/01/2018  . Anemia 07/31/2018  . Speech abnormality 07/31/2018  . Malnutrition of moderate degree 01/21/2018  . Intra-abdominal abscess ([place]) 12/24/2017  . Hyperlipidemia 11/12/2017  . Erectile dysfunction 05/05/2017  . 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 History of kidney stones   . PAD (peripheral artery disease) ([place]) 01/22/2015  . Anemia of chronic disease 09/06/2014  . 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 Atherosclerotic PVD with ulceration ([place]) 11/14/2013  . Atherosclerotic PVD with intermittent claudication ([place]) 10/31/2013  . 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 Hypertension   . Arterial thromboembolism ([place]) 08/09/2013  . 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 Right inguinal hernia 03/23/2013  . Umbilical hernia 72/62/0355  . Ulcerative colitis ([place]) 03/23/2013   Past Medical History:  Past Medical History:  Diagnosis Date  . Anemia of chronic disease 2015   "related to cancer tx" (08/09/2013)  . Blood clot in vein 2015  . Colon cancer ([place]) 03/2013  . History of kidney stones    x 2passed them both  . Hypertension   . Peripheral vascular disease ([place])   . Ulcerative colitis ([place])    history   Past Surgical History:  Past Surgical History:  Procedure Laterality Date  . ABDOMINAL AORTAGRAM N/A 08/09/2013   Procedure: ABDOMINAL Maxcine Ham;  Surgeon: [name], MD;  Location: [facility] CATH LAB;  Service: Cardiovascular;  Laterality: N/A;  . CHOLECYSTECTOMY N/A 09/08/2014   Procedure: LAPAROSCOPIC CHOLECYSTECTOMY WITH INTRAOPERATIVE CHOLANGIOGRAM;  Surgeon: Armandina Gemma, MD;   Location: [facility] ORS;  Service: General;  Laterality: N/A;  . COLON SURGERY  02/2013  . COLON SURGERY  10/2015  . FEMORAL-POPLITEAL BYPASS GRAFT Right 08/11/2013   Procedure:   RIGHT - POPLITEAL TO PERONEAL ARTERY BYPASS GRAFT  WITH NONREVERSED SAPHENOUS VEIN GRAFT,tHROMBECTOMY ANTERIOR TIBIALIS,ATTEMPTED THROMBECTOMY TIBIO-PERONEAL TRUNK AND POSTERIOR TIBIALIS, INTRAOPERATIVE ARTERIOGRAM.;  Surgeon: Mal Misty, MD;  Location: [facility];  Service: Vascular;  Laterality: Right;  . FEMORAL-TIBIAL BYPASS GRAFT Right 11/17/2013   Procedure: BYPASS GRAFT RIGHT ABOVE KNEE POPLITEAL TO POSTERIOR TIBIAL ARTERY USING RIGHT NON-REVERSED CEPHALIC VEIN;  Surgeon: Mal Misty, MD;  Location: [facility];  Service: Vascular;  Laterality: Right;  . HERNIA REPAIR  ~ 1995; 03/2013   UHR  . INTRAOPERATIVE ARTERIOGRAM Right 11/17/2013   Procedure: INTRA OPERATIVE ARTERIOGRAM;  Surgeon: Mal Misty, MD;  Location: [facility] OR;  Service: Vascular;  Laterality: Right;  . IR GENERIC HISTORICAL  12/11/2015   IR RADIOLOGIST EVAL & MGMT 12/11/2015 Arne Cleveland, MD [facility] INTERV RAD  . IR GENERIC HISTORICAL  12/24/2015   IR RADIOLOGIST EVAL & MGMT 12/24/2015 Jacqulynn Cadet, MD [facility] INTER[facility] RAD  . IR GENERIC HISTORICAL  01/21/2016   IR RADIOLOGIST EVAL & MGMT 01/21/2016 [name], MD [facility] INTER[facility]  . IR GENERIC HISTORICAL  02/04/2016   IR RADIOLOGIST EVAL & MGMT 02/04/2016 Sandi Mariscal, MD [facility] INTER[facility] RAD  . IR GENERIC HISTORICAL  02/12/2016   IR CATHETER TUBE CHANGE 02/12/2016 Corrie Mckusick, DO [facility]  . IR GENERIC HISTORICAL  02/20/2016   IR RADIOLOGIST EVAL & MGMT 02/20/2016 Markus Daft, MD [facility] INTER[facility] RAD  . IR SINUS/FIST TUBE CHK-NON GI  01/04/2018  . LAPAROSCOPIC RIGHT HEMI COLECTOMY N/A 03/28/2013   Procedure: LAPAROSCOPIC ASSISTED HEMI COLECTOMY;  Surgeon: Pedro Earls, MD;  Location: [facility];  Service: General;  Laterality: N/A;  . LAPAROSCOPY N/A 11/05/2015   Procedure: LAPAROSCOPY DIAGNOSTIC;  Surgeon: [name], MD;  Location: [facility] ORS;  Service: General;  Laterality: N/A;  . LAPAROTOMY N/A 11/05/2015   Procedure: EXPLORATORY LAPAROTOMY, resection of mass at tail of pancreas;  Surgeon: [name], MD;  Location: [facility];  Service: General;  Laterality: N/A;  . LOWER EXTREMITY ANGIOGRAM Bilateral 08/09/2013  . TEE WITHOUT CARDIOVERSION N/A 08/10/2013   Procedure: TRANSESOPHAGEAL ECHOCARDIOGRAM (TEE);  Surgeon: Candee Furbish, MD;  Location: [facility] ENDOSCOPY;  Service: Cardiovascular;  Laterality: N/A;  . TONSILLECTOMY  ~ 1950  . [place]; 03/2013  . VASECTOMY    . VEIN HARVEST Right 11/17/2013   Procedure: HARVEST OF RIGHT UPPER EXTREMITY CEPHALIC VEIN;  Surgeon: Mal Misty, MD;  Location: [facility] OR;  Service: Vascular;  Laterality: Right;   HPI:  pt is a 73 yo male with h/o metastatic colon cancer to pancreas and h/o arterial thromboembolism on Xarelto admitted with transit speech difficulty and AMS.  MRI negative showed mild age related atropy, right maxillary sinusitis. CT negative for acute event.  Speech evaluation ordered.  .   Assessment / Plan / Recommendation Clinical Impression  MOCA 7.2 administered with pt scoring 25/30 - 26 or above is normal score.  No dysarthria or aphasia observed nor focal CN deficits.  Pt demonstrated decreased recall of 5 words = recalling 2/5 independently - 3/5 with category cue.   No SLP follow up indicated but will provide him with memory compensation strategies.  Pt agreeable with plan.  Thanks for this consult.    SLP Assessment  SLP Recommendation/Assessment: Patient does not need any further Speech Lanaguage Pathology Services    Follow Up Recommendations  None    Frequency and Duration   n/a        SLP Evaluation Cognition  Overall Cognitive Status: No family/caregiver present to determine baseline cognitive functioning Arousal/Alertness: Awake/alert Orientation Level: Oriented X4 Attention: Sustained Sustained Attention:  Impaired Sustained Attention Impairment: Verbal complex Memory: Impaired Memory Impairment: Retrieval deficit(cues needed for 3/5 words, 2 words independently) Awareness: Appears intact Problem Solving: Appears intact Problem Solving Impairment: Verbal complex(difficulty with mental math question due to decreased sustained attention)       Comprehension  Auditory Comprehension Yes/No Questions: Not tested Commands: Within Functional Limits Conversation: Complex Visual Recognition/Discrimination Discrimination: Not tested Reading Comprehension Reading Status: Not tested    Expression Expression Primary Mode of Expression: Verbal Verbal Expression Overall Verbal Expression: Appears within functional limits for tasks assessed Initiation: No impairment Repetition: No impairment Naming: No impairment Pragmatics: No impairment Written Expression Dominant Hand: Right Written Expression: Within Functional Limits   Oral / Motor  Oral Motor/Sensory Function Overall Oral Motor/Sensory Function: Within functional limits Motor Speech Overall Motor Speech: Appears within functional limits for tasks assessed Respiration: Within functional limits Resonance: Within functional limits Articulation: Within functional limitis Intelligibility: Intelligible Motor Planning: Witnin functional limits   GO                    Macario Golds 08/01/2018, 12:45 PM  Luanna Salk, [place] [facility] SLP Acute Rehab Services Pager [phone] Office [phone]

## 2018-08-01 NOTE — Evaluation (Addendum)
Physical Therapy Evaluation Patient Details Name: Cody Suarez. MRN: [id number] DOB: 1945/12/26 Today's Date: 08/01/2018   History of Present Illness  Cody Mccard. is a 73 y.o. 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 He presented to [facility] long ED for evaluation of new onset speech abnormality    Clinical Impression  Cody Suarez. is a 73 y.o. male admitted from ED for trouble with speech and above HPI. He reports independence with mobility at baseline and currently requires min guard for transfers and gait with RW. No overt LOB noted during gait however slight imbalances observed during turns and obstacle negotiation. He will benefit from additional skiled PT at below venue to improve balance and independence with gait and functional mobility. Acute PT will follow to progress mobility as able.   Follow Up Recommendations Home health PT;Supervision for mobility/OOB    Equipment Recommendations  None recommended by PT    Recommendations for Other Services       Precautions / Restrictions Precautions Precautions: Fall Restrictions Weight Bearing Restrictions: No      Mobility  Bed Mobility Overal bed mobility: Needs Assistance Bed Mobility: Supine to Sit     Supine to sit: Min guard        Transfers Overall transfer level: Needs assistance Equipment used: Rolling walker (2 wheeled) Transfers: Sit to/from [facility] Sit to Stand: Min guard Stand pivot transfers: Min guard              Balance Overall balance assessment: Needs assistance Sitting-balance support: No upper extremity supported;Feet supported Sitting balance-Leahy Scale: Good Sitting balance - Comments: pt able to don socks on foot at EOB with no assistance and no LOB     Standing balance-Leahy Scale: Good Standing  balance comment: pt with good overall balance in standing requiring UE support for dynamic mobiltiy            Pertinent Vitals/Pain Pain Assessment: Faces Pain Score: 0-No pain Faces Pain Scale: No hurt    Home Living Family/patient expects to be discharged to:: Private residence Living Arrangements: Spouse/significant other Available Help at Discharge: Family Type of Home: House Home Access: Stairs to enter Entrance Stairs-Rails: Can reach both Entrance Stairs-Number of Steps: 3 at the back door with railings Home Layout: One level Home Equipment: Environmental consultant - 2 wheels      Prior Function Level of Independence: Independent       Comments: pt reports he was still mobilizing indpendently prior to admission and that he continues to do work around his home     Hand Dominance   Dominant Hand: Right    Extremity/Trunk Assessment   Upper Extremity Assessment Upper Extremity Assessment: Defer to OT evaluation    Lower Extremity Assessment Lower Extremity Assessment: Generalized weakness    Cervical / Trunk Assessment Cervical / Trunk Assessment: Kyphotic  Communication   Communication: HOH  Cognition Arousal/Alertness: Awake/alert Behavior During Therapy: WFL for tasks assessed/performed Overall Cognitive Status: History of cognitive impairments - at baseline                      Assessment/Plan    PT Assessment Patient needs continued PT services  PT Problem List Decreased strength;Decreased activity tolerance;Decreased balance;Decreased mobility;Decreased safety awareness       PT Treatment Interventions DME instruction;Gait training;Stair training;Functional mobility training;Therapeutic activities;Therapeutic exercise;Balance training;Neuromuscular re-education;Patient/family education;Manual techniques  PT Goals (Current goals can be found in the Care Plan section)  Acute Rehab PT Goals Patient Stated Goal: patient wants to return home to his  wife PT Goal Formulation: With patient Time For Goal Achievement: 08/08/18 Potential to Achieve Goals: Fair    Frequency Min 3X/week   Barriers to discharge        Co-evaluation PT/OT/SLP Co-Evaluation/Treatment: Yes Reason for Co-Treatment: Complexity of the patient's impairments (multi-system involvement);For patient/therapist safety;To address functional/ADL transfers;Necessary to address cognition/behavior during functional activity PT goals addressed during session: Mobility/safety with mobility;Balance         AM-PAC PT "6 Clicks" Mobility  Outcome Measure Help needed turning from your back to your side while in a flat bed without using bedrails?: A Little Help needed moving from lying on your back to sitting on the side of a flat bed without using bedrails?: A Little Help needed moving to and from a bed to a chair (including a wheelchair)?: A Little Help needed standing up from a chair using your arms (e.g., wheelchair or bedside chair)?: A Little Help needed to walk in hospital room?: A Little Help needed climbing 3-5 steps with a railing? : A Little 6 Click Score: 18    End of Session Equipment Utilized During Treatment: Gait belt Activity Tolerance: Patient tolerated treatment well Patient left: with call bell/phone within reach;in chair;with chair alarm set Nurse Communication: Mobility status;Other (comment)(pt would benefit from sitter as he remains high fall risk and has reduced safety awareness with mobility.) PT Visit Diagnosis: Unsteadiness on feet (R26.81);Other abnormalities of gait and mobility (R26.89);Muscle weakness (generalized) (M62.81);Difficulty in walking, not elsewhere classified (R26.2)    Time: 3845-3646 PT Time Calculation (min) (ACUTE ONLY): 20 min   Charges:   PT Evaluation $PT Eval Low Complexity: 1 Low          Kipp Brood, PT, DPT, [facility] Physical Therapist with [facility]  08/01/2018 5:19 PM

## 2018-08-01 NOTE — Telephone Encounter (Addendum)
Wife left VM requesting to speak with MD/NP regarding Cody Suarez's admission and condition. Called back and left her a VM that MD will be notified of admission. Wife called back and made nurse aware of admission and low blood counts. She confirmed they have noted no bleeding. She is concerned he will not be able to tolerate the [facility] placement on 08/15/18. Made her aware that most likely he will be able to have port when his anemia is corrected. Informed her that Dr. Benay Spice will check on him in hospital tomorrow.

## 2018-08-02 ENCOUNTER — Encounter: Payer: Self-pay | Admitting: *Deleted

## 2018-08-02 ENCOUNTER — Other Ambulatory Visit: Payer: Self-pay | Admitting: Oncology

## 2018-08-02 DIAGNOSIS — I749 Embolism and thrombosis of unspecified artery: Secondary | ICD-10-CM

## 2018-08-02 DIAGNOSIS — D500 Iron deficiency anemia secondary to blood loss (chronic): Secondary | ICD-10-CM

## 2018-08-02 DIAGNOSIS — D649 Anemia, unspecified: Secondary | ICD-10-CM

## 2018-08-02 DIAGNOSIS — R4781 Slurred speech: Secondary | ICD-10-CM

## 2018-08-02 DIAGNOSIS — C186 Malignant neoplasm of descending colon: Secondary | ICD-10-CM

## 2018-08-02 DIAGNOSIS — Z7189 Other specified counseling: Secondary | ICD-10-CM | POA: Insufficient documentation

## 2018-08-02 DIAGNOSIS — R4182 Altered mental status, unspecified: Secondary | ICD-10-CM

## 2018-08-02 LAB — TYPE AND SCREEN
ABO/RH(D): B NEG
Antibody Screen: NEGATIVE
Unit division: 0
Unit division: 0

## 2018-08-02 LAB — BASIC METABOLIC PANEL
Anion gap: 6 (ref 5–15)
BUN: 9 mg/dL (ref 8–23)
CO2: 21 mmol/L — ABNORMAL LOW (ref 22–32)
Calcium: 8.1 mg/dL — ABNORMAL LOW (ref 8.9–10.3)
Chloride: 106 mmol/L (ref 98–111)
Creatinine, Ser: 0.94 mg/dL (ref 0.61–1.24)
GFR calc Af Amer: >60 mL/min (ref >60)
GFR calc non Af Amer: >60 mL/min (ref >60)
Glucose, Bld: 118 mg/dL — ABNORMAL HIGH (ref 70–99)
Potassium: 3.6 mmol/L (ref 3.5–5.1)
Sodium: 133 mmol/L — ABNORMAL LOW (ref 135–145)

## 2018-08-02 LAB — BPAM RBC
Blood Product Expiration Date: 202007282359
Blood Product Expiration Date: 202008172359
ISSUE DATE / TIME: 202007262058
ISSUE DATE / TIME: 202007270128
Unit Type and Rh: 1700
Unit Type and Rh: 9500

## 2018-08-02 LAB — CBC
HCT: 28.2 % — ABNORMAL LOW (ref 39.0–52.0)
Hemoglobin: 8.6 g/dL — ABNORMAL LOW (ref 13.0–17.0)
MCH: 23.1 pg — ABNORMAL LOW (ref 26.0–34.0)
MCHC: 30.5 g/dL (ref 30.0–36.0)
MCV: 75.8 fL — ABNORMAL LOW (ref 80.0–100.0)
Platelets: 400 10*3/uL (ref 150–400)
RBC: 3.72 MIL/uL — ABNORMAL LOW (ref 4.22–5.81)
RDW: 16.6 % — ABNORMAL HIGH (ref 11.5–15.5)
WBC: 14 10*3/uL — ABNORMAL HIGH (ref 4.0–10.5)
nRBC: 0 % (ref 0.0–0.2)

## 2018-08-02 LAB — URINE CULTURE

## 2018-08-02 MED ORDER — ASPIRIN EC 81 MG PO TBEC: 81.0000 mg | DELAYED_RELEASE_TABLET | Freq: Every day | ORAL | 3 refills | Status: DC

## 2018-08-02 MED ORDER — FERROUS SULFATE 325 (65 FE) MG PO TABS
325.0000 mg | ORAL_TABLET | Freq: Three times a day (TID) | ORAL | Status: DC
  Administered 2018-08-02: 325 mg via ORAL
  Filled 2018-08-02: qty 1

## 2018-08-02 MED ORDER — FERROUS SULFATE 325 (65 FE) MG PO TABS: 325.0000 mg | ORAL_TABLET | Freq: Three times a day (TID) | ORAL | 2 refills | Status: DC

## 2018-08-02 NOTE — Progress Notes (Addendum)
HEMATOLOGY-ONCOLOGY PROGRESS NOTE  SUBJECTIVE: Feels much better today.  Wants to go home.  He does not seem to recall the events that prompted his admission.  Denies abdominal pain, nausea, vomiting.  He denies bleeding.  REVIEW OF SYSTEMS:   Constitutional: Denies fevers, chills  Respiratory: Denies cough, dyspnea or wheezes Cardiovascular: Denies palpitation, chest discomfort Gastrointestinal:  Denies nausea, heartburn or change in bowel habits Skin: Denies abnormal skin rashes Lymphatics: Denies new lymphadenopathy or easy bruising Neurological:Denies numbness, tingling or new weaknesses Behavioral/Psych: Mood is stable, no new changes  Extremities: No lower extremity edema All other systems were reviewed with the patient and are negative.  I have reviewed the past medical history, past surgical history, social history and family history with the patient and they are unchanged from previous note.   PHYSICAL EXAMINATION:  Vitals:   08/02/18 0415 08/02/18 0905  BP: 125/71 136/65  Pulse: 84 84  Resp: 16 18  Temp: 98.2 F (36.8 C) 98.2 F (36.8 C)  SpO2: 98% 98%   Filed Weights   07/31/18 1616  Weight: 153 lb (69.4 kg)    Intake/Output from previous day: 07/27 0701 - 07/28 0700 In: 600 [P.O.:600] Out: 300 [Urine:300]  GENERAL:alert, no distress and comfortable  LUNGS: clear to auscultation and percussion with normal breathing effort HEART: regular rate & rhythm and no murmurs and no lower extremity edema ABDOMEN:abdomen soft, non-tender and normal bowel sounds Musculoskeletal:no cyanosis of digits and no clubbing  NEURO: alert & oriented x 3 with fluent speech, no focal motor/sensory deficits  LABORATORY DATA:  I have reviewed the data as listed CMP Latest Ref Rng & Units 08/02/2018 07/31/2018 07/31/2018  Glucose 70 - 99 mg/dL 118(H) 132(H) 134(H)  BUN 8 - 23 mg/dL 9 8 11   Creatinine 0.61 - 1.24 mg/dL 0.94 1.00 1.22  Sodium 135 - 145 mmol/L 133(L) 135 134(L)   Potassium 3.5 - 5.1 mmol/L 3.6 3.5 3.4(L)  Chloride 98 - 111 mmol/L 106 104 103  CO2 22 - 32 mmol/L 21(L) - 17(L)  Calcium 8.9 - 10.3 mg/dL 8.1(L) - 9.0  Total Protein 6.5 - 8.1 g/dL - - 7.9  Total Bilirubin 0.3 - 1.2 mg/dL - - 0.5  Alkaline Phos 38 - 126 U/L - - 76  AST 15 - 41 U/L - - 18  ALT 0 - 44 U/L - - 6    Lab Results  Component Value Date   WBC 14.0 (H) 08/02/2018   HGB 8.6 (L) 08/02/2018   HCT 28.2 (L) 08/02/2018   MCV 75.8 (L) 08/02/2018   PLT 400 08/02/2018   NEUTROABS 14.1 (H) 07/31/2018    Ct Angio Head W Or Wo Contrast  Result Date: 07/31/2018 CLINICAL DATA:  Focal neuro deficit. Mental status. Difficulty following words. EXAM: CT ANGIOGRAPHY HEAD AND NECK TECHNIQUE: Multidetector CT imaging of the head and neck was performed using the standard protocol during bolus administration of intravenous contrast. Multiplanar CT image reconstructions and MIPs were obtained to evaluate the vascular anatomy. Carotid stenosis measurements (when applicable) are obtained utilizing NASCET criteria, using the distal internal carotid diameter as the denominator. CONTRAST:  57m OMNIPAQUE IOHEXOL 350 MG/ML SOLN COMPARISON:  CT head without contrast 07/31/2018 FINDINGS: CTA NECK FINDINGS Aortic arch: There is a common origin of the left common carotid artery and the innominate artery. Atherosclerotic changes are present the aortic arch without aneurysm. The left vertebral artery originates directly from the arch, distal to the left subclavian artery. Right carotid system: The right common  carotid artery is within normal limits. Atherosclerotic irregularity is present in proximal right ICA without a significant stenosis. More distal cervical right ICA is normal. Left carotid system: The left common carotid artery is within normal limits. Bulky calcifications are present along the medial aspect of the proximal left ICA without significant stenosis relative to the more distal vessel. The distal  left ICA is normal. Vertebral arteries: The vertebral arteries are codominant. Atherosclerotic calcifications are present at the origin of the right vertebral artery from the subclavian artery without significant stenosis. There is no significant stenosis in either vertebral artery in the neck. Skeleton: Slight degenerative anterolisthesis is present at C3-4. Mild endplate changes are present most notably at C2-3 and C3-4. No focal lytic or blastic lesions are present. Other neck: No focal mucosal or submucosal lesions are present. Cords are midline and symmetric. A 10 mm incidental left thyroid nodule is noted posteriorly. Thyroid is otherwise mildly atrophic. The salivary glands are within normal limits. No significant cervical adenopathy is present Upper chest: Lung apices are clear without focal nodule, mass, or airspace disease. Thoracic inlet is normal. Review of the MIP images confirms the above findings CTA HEAD FINDINGS Anterior circulation: Dense calcifications are present in the cavernous internal carotid arteries bilaterally without significant stenosis through the ICA termini. Distal ICA is normal bilaterally. The A1 and M1 segments are normal. The anterior communicating artery is patent. The MCA bifurcations are within normal limits. There is segmental irregularity and distal ACA and MCA branch vessels without a significant proximal stenosis or occlusion. Posterior circulation: Dense atherosclerotic calcifications are present at the dural margin the right vertebral artery without a significant stenosis. PICA origins are visualized and normal. Basilar artery is normal. Both posterior cerebral arteries originate from the basilar tip. There is some irregularity of distal PCA branch vessels without a significant proximal stenosis or occlusion. Venous sinuses: The dural sinuses are patent. The right transverse sinus is dominant. The straight sinus deep cerebral veins are intact. Cortical veins are  unremarkable. Anatomic variants: None Review of the MIP images confirms the above findings IMPRESSION: 1. No emergent large vessel occlusion. 2. Mild distal small vessel irregularity without a significant proximal stenosis, aneurysm, or branch vessel occlusion within the circle-of-Willis. 3. Atherosclerotic changes involve the carotid bifurcations bilaterally, the origins of the vertebral arteries bilaterally, and the cavernous internal carotid arteries bilaterally without significant stenosis relative to the more distal vessels. 4.  Aortic Atherosclerosis (ICD10-I70.0). Electronically Signed   By: San Morelle M.D.   On: 07/31/2018 17:40   Ct Angio Neck W And/or Wo Contrast  Result Date: 07/31/2018 CLINICAL DATA:  Focal neuro deficit. Mental status. Difficulty following words. EXAM: CT ANGIOGRAPHY HEAD AND NECK TECHNIQUE: Multidetector CT imaging of the head and neck was performed using the standard protocol during bolus administration of intravenous contrast. Multiplanar CT image reconstructions and MIPs were obtained to evaluate the vascular anatomy. Carotid stenosis measurements (when applicable) are obtained utilizing NASCET criteria, using the distal internal carotid diameter as the denominator. CONTRAST:  38m OMNIPAQUE IOHEXOL 350 MG/ML SOLN COMPARISON:  CT head without contrast 07/31/2018 FINDINGS: CTA NECK FINDINGS Aortic arch: There is a common origin of the left common carotid artery and the innominate artery. Atherosclerotic changes are present the aortic arch without aneurysm. The left vertebral artery originates directly from the arch, distal to the left subclavian artery. Right carotid system: The right common carotid artery is within normal limits. Atherosclerotic irregularity is present in proximal right ICA without a significant  stenosis. More distal cervical right ICA is normal. Left carotid system: The left common carotid artery is within normal limits. Bulky calcifications are  present along the medial aspect of the proximal left ICA without significant stenosis relative to the more distal vessel. The distal left ICA is normal. Vertebral arteries: The vertebral arteries are codominant. Atherosclerotic calcifications are present at the origin of the right vertebral artery from the subclavian artery without significant stenosis. There is no significant stenosis in either vertebral artery in the neck. Skeleton: Slight degenerative anterolisthesis is present at C3-4. Mild endplate changes are present most notably at C2-3 and C3-4. No focal lytic or blastic lesions are present. Other neck: No focal mucosal or submucosal lesions are present. Cords are midline and symmetric. A 10 mm incidental left thyroid nodule is noted posteriorly. Thyroid is otherwise mildly atrophic. The salivary glands are within normal limits. No significant cervical adenopathy is present Upper chest: Lung apices are clear without focal nodule, mass, or airspace disease. Thoracic inlet is normal. Review of the MIP images confirms the above findings CTA HEAD FINDINGS Anterior circulation: Dense calcifications are present in the cavernous internal carotid arteries bilaterally without significant stenosis through the ICA termini. Distal ICA is normal bilaterally. The A1 and M1 segments are normal. The anterior communicating artery is patent. The MCA bifurcations are within normal limits. There is segmental irregularity and distal ACA and MCA branch vessels without a significant proximal stenosis or occlusion. Posterior circulation: Dense atherosclerotic calcifications are present at the dural margin the right vertebral artery without a significant stenosis. PICA origins are visualized and normal. Basilar artery is normal. Both posterior cerebral arteries originate from the basilar tip. There is some irregularity of distal PCA branch vessels without a significant proximal stenosis or occlusion. Venous sinuses: The dural  sinuses are patent. The right transverse sinus is dominant. The straight sinus deep cerebral veins are intact. Cortical veins are unremarkable. Anatomic variants: None Review of the MIP images confirms the above findings IMPRESSION: 1. No emergent large vessel occlusion. 2. Mild distal small vessel irregularity without a significant proximal stenosis, aneurysm, or branch vessel occlusion within the circle-of-Willis. 3. Atherosclerotic changes involve the carotid bifurcations bilaterally, the origins of the vertebral arteries bilaterally, and the cavernous internal carotid arteries bilaterally without significant stenosis relative to the more distal vessels. 4.  Aortic Atherosclerosis (ICD10-I70.0). Electronically Signed   By: San Morelle M.D.   On: 07/31/2018 17:40   Mr Brain Wo Contrast  Result Date: 07/31/2018 CLINICAL DATA:  Initial evaluation for acute speech difficulty, confusion. EXAM: MRI HEAD WITHOUT CONTRAST TECHNIQUE: Multiplanar, multiecho pulse sequences of the brain and surrounding structures were obtained without intravenous contrast. COMPARISON:  Prior head CT from earlier the same day. FINDINGS: Brain: Examination moderately degraded by motion artifact. Diffuse prominence of the CSF containing spaces compatible with generalized age-related cerebral atrophy. Patchy T2/FLAIR hyperintensity within the periventricular and deep white matter both cerebral hemispheres most consistent with chronic microvascular ischemic disease, mild for age. No abnormal foci of restricted diffusion to suggest acute or subacute ischemia. Gray-white matter differentiation maintained. No encephalomalacia to suggest chronic cortical infarction. No foci of susceptibility artifact to suggest acute or chronic intracranial hemorrhage. No mass lesion, midline shift or mass effect. No hydrocephalus. No extra-axial fluid collection. Pituitary gland suprasellar region within normal limits. Midline structures intact and  normal. Vascular: Major intracranial vascular flow voids are maintained. Skull and upper cervical spine: Degenerative thickening noted about the tectorial membrane without significant stenosis. Craniocervical junction otherwise  unremarkable. Bone marrow signal intensity within normal limits. No scalp soft tissue abnormality. Sinuses/Orbits: Globes and orbital soft tissues within normal limits. Chronic right maxillary sinusitis noted. Paranasal sinuses are otherwise clear. No mastoid effusion. Inner ear structures normal. Other: None. IMPRESSION: 1. No acute intracranial abnormality. 2. Mild age-related cerebral atrophy with chronic microvascular ischemic disease. 3. Chronic right maxillary sinusitis. Electronically Signed   By: Jeannine Boga M.D.   On: 07/31/2018 20:55   Ct Abdomen Pelvis W Contrast  Result Date: 07/21/2018 CLINICAL DATA:  Restaging pancreatic cancer. EXAM: CT ABDOMEN AND PELVIS WITH CONTRAST TECHNIQUE: Multidetector CT imaging of the abdomen and pelvis was performed using the standard protocol following bolus administration of intravenous contrast. CONTRAST:  186m OMNIPAQUE IOHEXOL 300 MG/ML  SOLN COMPARISON:  04/27/2018 FINDINGS: Lower chest: Trace left pleural effusion, new. Right middle lobe lung nodule measures 7 mm, image 6/6. Unchanged. Hepatobiliary: Diffuse hepatic steatosis. Wedge-shaped area of peripheral arterial phase enhancement is noted within segment 2, favor benign perfusion anomaly. On the portal venous phase images no suspicious liver lesions identified. Gallbladder is surgically absent. No biliary ductal dilatation. Pancreas: No inflammation or main duct dilatation identified. Thick walled mass lesion arising from tail of pancreas and extending into the gastrosplenic ligament measures 8.6 by 6.1 by 6.7 (volume = 180), image 37/7 and image 54/8. On the previous exam this mass measured 7.1 x 3.4 by 6.1 cm (volume = 77 cm^3). Tumor involves the posterior wall of stomach,  splenic hilum, and splenic flexure. Partial encasement of the colon at the level of the splenic flexure is noted, image 43/7. Spleen: Abnormal diminished perfusion within the upper spleen is new from previous exam and could represent progressive tumor infiltration into the splenic parenchyma versus vascular compromise due to mass effect from tumor. Adrenals/Urinary Tract: Normal appearance of the adrenal glands. 6 mm inferior pole right renal calculus. Left upper quadrant tumor demonstrates progressive caudal extension and now appears to contact the upper pole of left kidney. No definite infiltration of the left kidney parenchyma. Urinary bladder appears normal. Stomach/Bowel: Tumor involvement of the stomach and splenic flexure as above. No findings to suggest colonic obstruction at this time. Vascular/Lymphatic: Aortic atherosclerosis. No aneurysm. The portal vein is patent. 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 No retroperitoneal adenopathy. No pelvic or inguinal adenopathy. Reproductive: Prostate is unremarkable. Other: No free fluid or fluid collection. Small periumbilical hernia contains a nonobstructed loop of small bowel. No ascites or focal fluid collections. Musculoskeletal: No acute or significant osseous findings. IMPRESSION: 1. Interval increase in size of left upper quadrant gastrosplenic ligament mass. As noted previously there is involvement of the stomach, spleen, and splenic flexure. 2. Diminished perfusion within the upper spleen may reflect progressive tumor infiltration or vascular compromise. Electronically Signed   By: TKerby MoorsM.D.   On: 07/21/2018 12:38   Ct Head Code Stroke Wo Contrast  Addendum Date: 07/31/2018   ADDENDUM REPORT: 07/31/2018 18:26 ADDENDUM: Voice recognition error: The second sentence of the impression section should read "No acute intracranial abnormality. " Electronically Signed   By: CSan MorelleM.D.    On: 07/31/2018 18:26   Result Date: 07/31/2018 CLINICAL DATA:  Code stroke. Unable to speak beginning at 12:30 p.m. today. EXAM: CT HEAD WITHOUT CONTRAST TECHNIQUE: Contiguous axial images were obtained from the base of the skull through the vertex without intravenous contrast. COMPARISON:  None FINDINGS: Brain: Moderate atrophy and white matter disease is present bilaterally. Basal ganglia  are intact. Insular ribbon is normal. Acute or focal cortical abnormalities are present. The ventricles are proportionate to the degree of atrophy. No significant extraaxial fluid collection is present. Vascular: Atherosclerotic calcifications are present within the cavernous internal carotid arteries and at the dural margin of the right vertebral artery. There is no hyperdense vessel. Skull: Calvarium is intact. No focal lytic or blastic lesions are present. Sinuses/Orbits: Chronic right maxillary sinus disease is present. The paranasal sinuses and mastoid air cells are clear. The globes and orbits are within normal limits. ASPECTS [facility] Stroke Program Early CT Score) - Ganglionic level infarction (caudate, lentiform nuclei, internal capsule, insula, M1-M3 cortex): 7/7 - Supraganglionic infarction (M4-M6 cortex): 3/3 Total score (0-10 with 10 being normal): 10/10 IMPRESSION: 1. Moderate diffuse atrophy and white matter disease. This likely reflects the sequela of chronic microvascular ischemia. 2. Acute intracranial abnormality. 3. ASPECTS is 10/10 4. Chronic right maxillary sinus disease. These results were called by telephone at the time of interpretation on 07/31/2018 at 4:39 pm to Dr. Isla Pence , who verbally acknowledged these results. Electronically Signed: By: San Morelle M.D. On: 07/31/2018 16:40    ASSESSMENT AND PLAN: 1. 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 Negative for a BRAF mutation  Tumor invaded through the muscularis propria into pericolonic fatty tissue and involved the attached omentum.   Cycle 1 adjuvant Xeloda 05/28/2013.   Cycle 2 adjuvant Xeloda 06/18/2013.   Cycle 3 adjuvant Xeloda 07/09/2013.   Cycle 4 adjuvant Xeloda 07/30/2013.   Cycle 5 adjuvant Xeloda 08/20/2013.  Cycle 6 adjuvant Xeloda 09/11/2013.  cycle 7 adjuvant Xeloda 10/02/2013  Cycle 8 adjuvant Xeloda 10/23/2013  CTs of the chest, abdomen, and pelvis on 07/18/2014-negative for recurrent colon cancer   CTs abdomen/pelvis 09/06/2014 showed acute cholecystitis.  Colonoscopy 08/02/2015-chronic colitis, no malignancy  CTs 09/17/2015, new mass near the tail the pancreas, stable lung nodules  Biopsy mesenteric mass 10/04/2015 with metastatic adenocarcinoma consistent with colorectal primary  PET scan 10/10/2015 with soft tissue thickening within the peritoneal space of the left upper quadrant with SUV Max 7.9; no additional sites of peritoneal nodularity to suggest metastasis; hypermetabolic ill-defined nodule in the left lower lobe favored inflammatory.  Resection of the left abdominal mesenteric mass/tail the pancreas on 11/05/2015 with the pathology confirming metastatic colon cancer, positive "serosal" margin  CTs 07/29/2016-no evidence of metastatic disease, stable small lung nodules, decreased size of pancreatic resection bed fluid/mass  CTs 06/28/2017-decreased fluid and increased soft tissue at the area of the distal pancreatectomy with a few foci of gas  CT 09/29/2017-enlargement of left upper quadrant soft tissue mass between the stomach, spleen, and pancreas tail, stable nodules at the lung bases  CT biopsy of the upper quadrant mass 10/19/2017-metastatic colon cancer, MSS, tumor mutation burden 1, no KRAS, BRAF, or NRAS mutation  CTs 12/24/2017-complex left upper quadrant process involving the  stomach, spleen, tail the pancreas, and distal transverse colon.  CT 04/27/2018-stable to mild progression of the left upper quadrant mass, compression of the stomach with no obstruction, no other evidence of metastatic disease, resolution of small fluid collection lateral to the left upper quadrant mass  CT 07/21/2018-enlargement of the left upper quadrant mass with involvement of the posterior stomach and spleen.  Possible new right upper lung nodule 2. Ulcerative colitis-extensive chronic active ulcerative colitis was noted on the colon resection specimen 03/28/2013.  Followed by Dr.Magod 3. Hypertension.  4. History of microcytic anemia-likely iron deficiency, unable to tolerate oral iron. Improved. 5. Possible area of cecal wall thickening noted on abdominal CT 03/20/2013. 6. Family history of colon cancer (maternal grandfather died in his 64s with colon cancer). 7. Bilateral calf and low anterior leg pain. Bilateral lower extremity venous duplex negative for DVT 07/14/2013. Right ABI with moderate, borderline severe arterial insufficiency; left ABI suggestive of moderate arterial insufficiency. He was referred to vascular. 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 TEE showed thrombus in the descending aorta. He underwent right popliteal to peroneal artery bypass graft, thrombectomy anterior tibialis and attempted thrombectomy tibioperoneal trunk and posterior tibialis on 08/11/2013. Right calf pain 09/26/2013 with findings of occlusion of right popliteal to peroneal bypass status post thrombolysis. Now on anticoagulation with Xarelto.  recurrent pain and ulceration at the right foot, status post a popliteal to posterior tibial artery bypass using a cephalic vein graft on 17/79/3903 8. Status post laparoscopic cholecystectomy 09/08/2014 9. Anorexia following the mesenteric mass/pancreas tail resection-improved 10. Abdominal abscess November 2017-pancreas "cysts "drainage  catheter placed 11/26/2015; CT 02/04/2016 with no change to slight decreasein size of residual pancreatic tail fluid collection.Drainage catheter removed approximately early March 2018  CT 02/20/2016-new small left pleural effusion, increased size of the surgical bed fluid collection, indeterminate pulmonary nodules  CT 07/29/2016-decreased fluid/soft tissue density near the pancreas is no evidence of metastatic disease, stable subcentimeter lung nodules 11.Admission 12/24/2017 with an abdominal abscess, status post placement of a percutaneous drain and antibiotic therapy; CTs abdomen/pelvis 01/04/2018-near complete drainage of previously noted complex fluid collection in the left upper quadrant. 2 small residual collections remain. Residual soft tissue mass at the clips concerning for residual recurrent neoplasm. Peritoneal wall thickening also concerning for metastatic disease. Drain removed 01/04/2018  CT 01/18/2018-no change in soft tissue mass at the tail of pancreas with involvement of the gastric fundus and spleen, there is central low attenuation  Admission 01/20/2018 with fever and failure to thrive, no drainable fluid collection, placed on IV antibiotics, discharged to complete an outpatient course of IV ceftriaxone and Flagyl completed 02/20/2018  12.  Admission 07/31/2018 transient speech abnormality and microcytic anemia  Mr Suarez is now admitted with transient speech abnormality which has now resolved.  He received 2 units of packed red blood cells for microcytic anemia.  Stool was Hemoccult negative and ferritin was low at 14.  Xarelto is on hold due to concern for anemia.  1.  Begin ferrous sulfate 325 mg 3 times a day. 2.  Okay to discharge patient home from medical oncology standpoint.  Will arrange for outpatient follow-up for this patient. 3.  Discuss anticoagulation options with vascular surgery, he is at increased risk for bleeding with tumor likely involving the  gastrointestinal tract   LOS: 1 day   Mikey Bussing, DNP, AGPCNP-BC, AOCNP 08/02/18 Cody Suarez was admitted with a speech abnormality and altered mental status.  The symptoms have resolved.  He has iron deficiency anemia, likely secondary to bleeding from tumor involving the colon or stomach while on anticoagulation therapy.  There is been no gross bleeding.  He has started iron replacement.  He is at increased risk for bleeding with tumor involving the gastrointestinal tract.  We should consult with vascular surgery regarding options for anticoagulation.  Cody Suarez has metastatic colon cancer.  The plan is to begin systemic therapy with FOLFOX.  He is scheduled for Port-A-Cath placement with Dr. Hassell Done on 08/15/2018.  We  will schedule FOLFOX to begin on 08/17/2018.  We discussed potential toxicities associated with the FOLFOX regimen including the chance for nausea/vomiting, mucositis, diarrhea, alopecia, and hematologic toxicity.  We discussed the sun sensitivity, rash, hyperpigmentation, and hand/foot syndrome associated with 5-fluorouracil.  We reviewed the allergic reaction and various types of neuropathy seen with oxaliplatin.  He will be scheduled for an office visit on 08/17/2018.  He will attend a chemotherapy teaching class.

## 2018-08-02 NOTE — Plan of Care (Signed)
  Problem: Self-Care: Goal: Ability to communicate needs accurately will improve Outcome: Completed/Met  Patient back to baseline with speech and communication

## 2018-08-02 NOTE — Progress Notes (Signed)
MD saw patient in hospital today and discussed FOLFOX regimen. Scheduling message sent for lab/flush/OV and 1st tx on 8/10 with education nurse session prior.

## 2018-08-02 NOTE — Discharge Summary (Signed)
Physician Discharge Summary  Patient ID: Cody Suarez. MRN: [id number] DOB/AGE: 06-26-1945 73 y.o.  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 is a 73 y.o. 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  History is limited from patient and is supplemented by wife at bedside, EDP, and chart review. Per wife, patient has had poor oral intake over the last 2 weeks with frequent nausea which has been somewhat relieved with Zofran as needed.  She states that he has a history of ulcerative colitis most commonly associated with loose stools however recently has had constipation.  She says after lunch on 07/31/2018 he had sudden alteration of mental status with inability to speak.  She felt as if he was trying to speak with but was unable to.  She is not sure if he was able to understand her.  She called EMS due to concern for stroke however on their arrival when asked if it was okay to bring him to the hospital, he said no  therefore was not brought immediately to the ED.  Patient's daughter-in-law who works in healthcare came to see him and again recommended further evaluation in the emergency department at which time he was agreeable and was brought by his wife via personal vehicle.  Code stroke was called immediately upon arrival. At time of admission evaluation patient is able to speak and follow commands.  He does not recall the episode earlier in the day but remembers getting into the car from his home.  He is on Xarelto for history of arterial thromboembolism.  He denies any obvious bleeding including epistaxis, hemoptysis, hematemesis, hematuria, hematochezia, or melena.  He currently is feeling very thirsty.  He denies any chest pain, dyspnea, abdominal pain, dysuria, or diarrhea. Patient had a CT abdomen/pelvis with contrast on 07/21/2018 which showed interval increase in size of left upper quadrant gastrosplenic ligament mass and findings suspicious for progressive tumor infiltration or vascular compromise of the upper spleen. Patient was planned to have Port-A-Cath placement on August 10 in anticipation of starting chemotherapy with FOLFOX. ED Course:  Initial vitals showed BP 124/73, pulse 112, RR 23, temp 99.2 Fahrenheit, SPO2 100% on room air. Labs are notable for WBC 17.5, hemoglobin 6.2 (10.5 on 04/27/2018), MCV 71.6, hematocrit 20.7, platelets 587,000, sodium 134, potassium 3.4, bicarb 17, BUN 11, creatinine 1.22. FOBT was negative.  SARS-CoV-2 test was negative. Patient arrived as code stroke.  Initial CT head without contrast was negative for acute intracranial abnormality, moderate diffuse atrophy and white matter disease was noted. CTA head and neck were negative for emergent large vessel occlusion.  Mild distal  small vessel irregularity without significant proximal stenosis, aneurysm, or branch vessel occlusion within the circle of Willis was noted. Telemetry neurology were consulted.  Patient was not considered  TPA candidate due to being on Xarelto with last dose 07/30/2018.  MRI brain was recommended and obtained which did not show any acute intracranial abnormality or infarct. Patient was started on transfusion of 2 units PRBCs.  The hospitalist service was consulted to admit for further evaluation and management.  Hospital Course  Transient speech abnormality: Possible TIA vs metabolic encephalopathy in setting of anemia and dehydration. Symptoms improved back to baseline while in the ED. CTA head/neck and MRI brain negative for large vessel occlusion or acute infarct respectively. - ECHO showed normal LVEF 60-65%, RV normal fxn - pt got prbc's for anemia and 1 L NS, and is feeling much better - symptoms resolved completely in ED  Microcytic anemia: Hemoglobin  on admission. No obvious bleeding source, FOBT is negative. Hb was 6.2 on admission. Is on Xarelto for history of arterial thromboembolism.  Suspected iron deficiency.  - pt recevied 2 units PRBC's and Hb up to 9.3 > 8.6 at time of dc - FOBT is negative - per ONC rec's  - start po Fe for fe def anemia   - ONC suspecting some gastric bleeding low-grade from cancer mets  - FOBT was negative  - continued Xarelto would be high risk for bleeding, per ONC rec I talked w/  Dr Scot Dock of Vasc surg who said that if at the time of the embolic phenomen  (in 6503) he did not have a known source (afib, etc) then the [place] can be  stopped and substitute aspirin 51m  - so Xarelto is now stopped and aspirin started 81 mg qd - per ONC they will f/u the patient's anemia and planning f/u visit in about 2 wks  Metastaticcoloncancer: Follows with oncology, Dr. SBenay Spice Recent CT imaging shows enlarging left upper quadrant mass and possible new lung nodule. Patient was planned for Port-A-Cath placement at the end of this week with plan to begin FOLFOX in the next 4 to 6 weeks  History of arterial thromboembolism: - as above   Hypokalemia: -  repeat in am, sp PO Kcl  Hypertension: - resume home losartan and spironolactone.  Ulcerative colitis: - currently stable, continue home sulfasalazine.  DVT prophylaxis:SCDs Code Status:DNR/DNI Consults: ONC appreciated  Discharge Exam: Blood pressure 120/60, pulse 90, temperature (!) 97.4 F (36.3 C), temperature source Oral, resp. rate 20, height 5' 10"  (1.778 m), weight 69.4 kg, SpO2 100 %.  pleasant Ox 3, chron ill appearing, no distress  lying flat  no jvd  Chest cta bilat  Cor reg no mrg  Abd soft ntnd no mass or ascite  ext no LE or UE edema  Neuro CN 2-12 grossly intact. Sensation intact, Strength 5/5 in both upper extremities, 4/5 in both lower extremities. Psychiatric:alert, oriented x 3 and nonfocal  Disposition: Discharge disposition: 01-Home or Self Care        Allergies as of 08/02/2018      Reactions   Amlodipine Swelling   Right LE edema   Gabapentin Rash   Short-term memory decrease      Medication List    STOP taking these medications   predniSONE 2.5 MG tablet Commonly known as: DELTASONE   rivaroxaban 20 MG Tabs tablet Commonly known as: Xarelto     TAKE these medications   acetaminophen 500 MG tablet Commonly known as: TYLENOL Take 1,000  mg by mouth daily as needed for mild pain or headache.   aspirin EC 81 MG tablet Take 1 tablet (81 mg total) by mouth daily.   BLUE-EMU SUPER STRENGTH EX Apply 1 application topically daily as needed (knee pain).   calcium carbonate 750 MG chewable tablet Commonly known as: TUMS EX Chew 1-2 tablets by mouth daily as needed for heartburn.   COQ10 PO Take 1 capsule by mouth daily. Notes to patient: Next dose 08/03/2018   ferrous sulfate 325 (65 FE) MG tablet Take 1 tablet (325 mg total) by mouth 3 (three) times daily with meals.   HYDROcodone-acetaminophen 5-325 MG tablet Commonly known as: NORCO/VICODIN Take 1 tablet by mouth every 6 (six) hours as needed for moderate pain.    losartan 100 MG tablet Commonly known as: COZAAR Take 0.5 tablets (50 mg total) by mouth daily. Notes to patient: Next dose 08/03/2018   megestrol 40 MG tablet Commonly known as: MEGACE Take 4 tablets (160 mg total) by mouth daily. What changed: how much to take   multivitamin with minerals Tabs tablet Take 1 tablet by mouth daily. Notes to patient: Next dose 08/03/2018   ondansetron 4 MG tablet Commonly known as: ZOFRAN TAKE 1 TABLET BY MOUTH EVERY 8 HOURS AS NEEDED FOR NAUSEA FOR VOMITING What changed:   how much to take  how to take this  when to take this  reasons to take this  additional instructions   spironolactone 25 MG tablet Commonly known as: ALDACTONE TAKE 1 TABLET BY MOUTH ONCE DAILY Notes to patient: Next dose 08/03/18   sulfaSALAzine 500 MG EC tablet Commonly known as: AZULFIDINE Take 1,500 mg by mouth daily. Notes to patient: Next dose 08/03/2018   THERAWORX RELIEF EX Apply 1 application topically daily as needed (calf pain).   zolpidem 5 MG tablet Commonly known as: AMBIEN Take 1 tablet (5 mg total) by mouth at bedtime as needed for sleep.        Signed: Sandy Salaam Kihanna Kamiya 08/02/2018, 4:04 PM

## 2018-08-02 NOTE — Progress Notes (Signed)
Updated patient's wife and Dr. Jonnie Finner also informed to call wife because she have some question for him.

## 2018-08-02 NOTE — Progress Notes (Signed)
START ON PATHWAY REGIMEN - Colorectal     A cycle is every 14 days:     Oxaliplatin      Leucovorin      Fluorouracil      Fluorouracil   **Always confirm dose/schedule in your pharmacy ordering system**  Patient Characteristics: Distant Metastases, Nonsurgical Candidate, KRAS/NRAS Mutation Positive/Unknown (BRAF V600 Wild-Type/Unknown), Standard Cytotoxic Therapy, First Line Standard Cytotoxic Therapy, Bevacizumab Ineligible, PS = 0,1 Tumor Location: Colon Therapeutic Status: Distant Metastases Microsatellite/Mismatch Repair Status: Unknown BRAF Mutation Status: Awaiting Test Results KRAS/NRAS Mutation Status: Awaiting Test Results Standard Cytotoxic Line of Therapy: First Line Standard Cytotoxic Therapy ECOG Performance Status: 1 Bevacizumab Eligibility: Ineligible Intent of Therapy: Non-Curative / Palliative Intent, Discussed with Patient 

## 2018-08-02 NOTE — Progress Notes (Signed)
Pt discharged home in stable condition. Discharge instructions given. Scripts sent to pharmacy of choice. No immediate questions or concerns at this time. Pt discharged from unit via wheelchair.

## 2018-08-02 NOTE — TOC Transition Note (Signed)
Transition of Care [facility]) - CM/SW Discharge Note   Patient Details  Name: Elchonon Maxson. MRN: [id number] Date of Birth: [birth year]  [facility]) CM/SW Contact:  Servando Snare, LCSW Phone Number: 08/02/2018, 4:05 PM   Clinical Narrative:   LCSW consulted at dc of [place] needs. HH arranged with [facility] per patient request.    Final next level of care: Home w Home Health Services Barriers to Discharge: No Barriers Identified   Patient Goals and CMS Choice Patient states their goals for this hospitalization and ongoing recovery are:: Get stronger CMS Medicare.gov Compare Post Acute Care list provided to:: Patient Choice offered to / list presented to : Patient  Discharge Placement                       Discharge Plan and Services                DME Arranged: Ostomy supplies DME Agency: NA       HH Arranged: PT [place] Agency: [place] ([place]) Date [place]: 08/02/18 Time [place]: 1604 Representative spoke with at [place]: [place] ([place]) Interventions     Readmission Risk Interventions No flowsheet data found.

## 2018-08-03 ENCOUNTER — Telehealth: Payer: Self-pay | Admitting: Nurse Practitioner

## 2018-08-03 ENCOUNTER — Ambulatory Visit: Payer: Medicare Other

## 2018-08-03 ENCOUNTER — Inpatient Hospital Stay: Payer: Medicare Other | Admitting: Oncology

## 2018-08-03 ENCOUNTER — Telehealth: Payer: Self-pay | Admitting: *Deleted

## 2018-08-03 NOTE — Telephone Encounter (Addendum)
Called concerned that hospital physican stopped his Xarelto upon discharge by telling him he did not have a fib. He reports the Xarelto was started due to bilateral DVT in 2016 by Dr. Kellie Simmering. Says he almost required amputation of right leg and states it only has 40% blood flow. Wants to confirm with Dr. Benay Spice he is safe not to take Xarelto. Per Dr. Benay Spice: This was discussed w/hospitalist and hospitalist discussed w/vascular surgery and it was decided that since he does not have a fib and it has been so long since the DVT he did not need to continue Xarelto as long as he was on daily aspirin. Are also concerned about increased risk for bleeding due to tumor in liver.  He understands and agrees.

## 2018-08-03 NOTE — Telephone Encounter (Signed)
Scheduled appt per 7/28 sch message pt aware of appt date and time

## 2018-08-04 ENCOUNTER — Telehealth: Payer: Self-pay

## 2018-08-04 ENCOUNTER — Ambulatory Visit: Payer: Medicare Other

## 2018-08-04 NOTE — Telephone Encounter (Signed)
Transition Care Management Follow-up Telephone Call  Admit date: 07/31/2018 Discharge date: 08/02/2018 Principal Problem: TIA (transient ischemic attack)   How have you been since you were released from the hospital? "I'm alright"   Do you understand why you were in the hospital? yes   Do you understand the discharge instructions? yes   Where were you discharged to? Home. Resides with wife.    Items Reviewed:  Medications reviewed: no  Allergies reviewed: yes  Dietary changes reviewed: yes  Referrals reviewed: yes, [facility] RN for home assessment today. Pt states they will not be providing nursing or PT. Several f/u specialty appointments scheduled.   Functional Questionnaire:   Activities of Daily Living (ADLs):   He states they are independent in the following: ambulation, bathing and hygiene, feeding, continence, grooming, toileting and dressing States they require assistance with the following: None.    Any transportation issues/concerns?: no   Any patient concerns? no   Confirmed importance and date/time of follow-up visits scheduled yes  Provider Appointment booked with Elyn Aquas, PA-C via Virtual Visit.   Confirmed with patient if condition begins to worsen call PCP or go to the ER.  Patient was given the office number and encouraged to call back with question or concerns.  : yes

## 2018-08-05 ENCOUNTER — Other Ambulatory Visit: Payer: Self-pay

## 2018-08-05 ENCOUNTER — Ambulatory Visit (INDEPENDENT_AMBULATORY_CARE_PROVIDER_SITE_OTHER): Payer: Medicare Other | Admitting: Physician Assistant

## 2018-08-05 ENCOUNTER — Encounter: Payer: Self-pay | Admitting: Physician Assistant

## 2018-08-05 ENCOUNTER — Ambulatory Visit: Payer: Self-pay | Admitting: Surgery

## 2018-08-05 VITALS — BP 132/63 | HR 83 | Ht 70.0 in

## 2018-08-05 DIAGNOSIS — C7889 Secondary malignant neoplasm of other digestive organs: Secondary | ICD-10-CM

## 2018-08-05 DIAGNOSIS — D509 Iron deficiency anemia, unspecified: Secondary | ICD-10-CM | POA: Diagnosis not present

## 2018-08-05 DIAGNOSIS — G459 Transient cerebral ischemic attack, unspecified: Secondary | ICD-10-CM

## 2018-08-05 NOTE — Progress Notes (Signed)
Virtual Visit via Video   I connected with patient on 08/05/18 at  1:30 PM EDT by a video enabled telemedicine application and verified that I am speaking with the correct person using two identifiers.  Location patient: Home Location provider: [facility], Office Persons participating in the virtual visit: Patient, Provider, [place] (Patina Moore)  I discussed the limitations of evaluation and management by telemedicine and the availability of in person appointments. The patient expressed understanding and agreed to proceed.  Subjective:   HPI:    Patient presents via [facility] today for hospitalization follow-up/TCM visit. Patient presented to ER on 07/31/2018 via EMS with c/o acute change in speech. Stroke protocol initiated on arrival. ER workup included vitals (BP 124/73, HR 112, R 23, T 99.2, Os 100%), WBC 17.5, Hgb 6.2, Platelets 587,000, NA 134, K 3.4, Cr 1.22, negative FOBT, SARS COVID negative. CT Head negative for acute intracranial abnormality. CTA Head and neck negative for emergent large vessel occlusion. Mild distal small vessel irregularity without significant stenosis or occlusion noted within Circle of Willis. Patient not TPA candidate due to Xarelto. Patient admitted for further management.  During admission, patient underwent echo revealing normal LVEF at 60-65%, RV normal function, Was given IV fluids and pRBCs for anemia with improvement if energy levels. Neuro symptoms resolved in the ER. Vascular surgeon consulted due to history of embolism and felt Xarelto could be discontinued as event was in 2015 with no found cause. Was put on 81 mg ASA daily. Otherwise resumed on home regimen. Discharged on 08/02/2018.  Since discharge patient endorses doing very well overall. Denies any fatigue, weakness, SOB. Denies altered mental status, change in vision, speech or focal neurological deficit. Is eating, hydrating and sleeping well. Is taking his medications as directed. Notes good  bowel and bladder output. Tolerating iron supplement without constipation. Has follow up scheduled with Oncology and Neurology.  ROS:   See pertinent positives and negatives per HPI.  Patient Active Problem List   Diagnosis Date Noted  . Goals of care, counseling/discussion 08/02/2018  . TIA (transient ischemic attack) 08/01/2018  . Colon adenocarcinoma ([place])   . Metastatic adenocarcinoma ([place])   . Anemia 07/31/2018  . Speech abnormality 07/31/2018  . Malnutrition of moderate degree 01/21/2018  . Intra-abdominal abscess ([place]) 12/24/2017  . Hyperlipidemia 11/12/2017  . Erectile dysfunction 05/05/2017  . 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 History of kidney stones   . PAD (peripheral artery disease) ([place]) 01/22/2015  . Anemia of chronic disease 09/06/2014  . 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 Atherosclerotic PVD with intermittent claudication ([place]) 10/31/2013  . 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 Hypertension   . 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 Right inguinal hernia 03/23/2013  . Umbilical hernia 22/02/5425  . Ulcerative colitis ([place]) 03/23/2013    Social History   Tobacco Use  . Smoking status: Never Smoker  . Smokeless tobacco: Never Used  . Tobacco comment: states his father died of emphysema from smoking, he did not want to be like that  Substance Use Topics  . Alcohol use: No    Current Outpatient Medications:  .  acetaminophen (TYLENOL) 500 MG tablet, Take 1,000 mg by mouth daily as needed for mild pain or headache. , Disp: , Rfl:  .  aspirin EC 81 MG tablet, Take 1 tablet (81 mg total) by  mouth daily., Disp: 90 tablet, Rfl: 3 .  calcium carbonate (TUMS EX) 750 MG chewable tablet, Chew 1-2 tablets by mouth daily as needed for heartburn., Disp: , Rfl:  .  Coenzyme  Q10 (COQ10 PO), Take 1 capsule by mouth daily., Disp: , Rfl:  .  ferrous sulfate 325 (65 FE) MG tablet, Take 1 tablet (325 mg total) by mouth 3 (three) times daily with meals., Disp: 90 tablet, Rfl: 2 .  Homeopathic Products [facility] RELIEF EX), Apply 1 application topically daily as needed (calf pain)., Disp: , Rfl:  .  HYDROcodone-acetaminophen (NORCO/VICODIN) 5-325 MG tablet, Take 1 tablet by mouth every 6 (six) hours as needed for moderate pain. , Disp: , Rfl:  .  Liniments (BLUE-EMU SUPER STRENGTH EX), Apply 1 application topically daily as needed (knee pain)., Disp: , Rfl:  .  losartan (COZAAR) 100 MG tablet, Take 0.5 tablets (50 mg total) by mouth daily., Disp: 45 tablet, Rfl: 2 .  megestrol (MEGACE) 40 MG tablet, Take 4 tablets (160 mg total) by mouth daily. (Patient taking differently: Take 40 mg by mouth daily. ), Disp: 30 tablet, Rfl: 11 .  Multiple Vitamin (MULTIVITAMIN WITH MINERALS) TABS tablet, Take 1 tablet by mouth daily., Disp: , Rfl:  .  ondansetron (ZOFRAN) 4 MG tablet, TAKE 1 TABLET BY MOUTH EVERY 8 HOURS AS NEEDED FOR NAUSEA FOR VOMITING (Patient taking differently: Take 4 mg by mouth every 8 (eight) hours as needed for nausea or vomiting. ), Disp: 20 tablet, Rfl: 3 .  spironolactone (ALDACTONE) 25 MG tablet, TAKE 1 TABLET BY MOUTH ONCE DAILY (Patient taking differently: Take 25 mg by mouth daily. ), Disp: 30 tablet, Rfl: 5 .  sulfaSALAzine (AZULFIDINE) 500 MG EC tablet, Take 1,500 mg by mouth daily. , Disp: , Rfl: 3 .  zolpidem (AMBIEN) 5 MG tablet, Take 1 tablet (5 mg total) by mouth at bedtime as needed for sleep., Disp: 30 tablet, Rfl: 2  Allergies  Allergen Reactions  . Amlodipine Swelling    Right LE edema  . Gabapentin Rash    Short-term memory decrease    Objective:   BP 132/63   Pulse 83   Ht 5' 10"  (1.778 m)   BMI 21.95 kg/m   Patient is well-developed, well-nourished in no acute distress.  Resting comfortably at home.  Head is normocephalic,  atraumatic.  No labored breathing.  Speech is clear and coherent with logical contest.  Patient is alert and oriented at baseline.   Assessment and Plan:   1. TIA (transient ischemic attack) Versus metabolic encephalopathy 2/2 significant anemia. Imaging unremarkable overall. Taking medications as directed including d/c Xarelto. Tolerating 81 mg ASA daily. Vitals stable today. No recurrence per patient. Limited neuro exam via video visit without abnormal findings. Follow-up with specialists as scheduled.  2. Iron deficiency anemia, unspecified iron deficiency anemia type Tolerating iron supplement without constipation. Denies noted side effects of iron supplementation. Needs repeat CBC but declines today as he notes he has labs scheduled with oncology in 2 weeks. Discussed that if there is any recurrent symptom, he needs to let us know ASAP. Strict ER precautions reviewed with patient.   3. Metastatic colon adenocarcinoma to pancreas s/p distal pancreatectomy 11/05/2015 Continue management per Oncology.    Leeanne Rio, PA-C 08/05/2018

## 2018-08-05 NOTE — Progress Notes (Signed)
I have discussed the procedure for the virtual visit with the patient who has given consent to proceed with assessment and treatment.   Caris Cerveny S Norval Slaven, CMA     

## 2018-08-09 NOTE — Patient Instructions (Addendum)
YOU NEED TO HAVE A COVID 19 TEST ON_8/6______ @_9 :15______, THIS TEST MUST BE DONE BEFORE SURGERY, COME  [address], [place] , [address]. ONCE YOUR COVID TEST IS COMPLETED, PLEASE BEGIN THE QUARANTINE INSTRUCTIONS AS OUTLINED IN YOUR HANDOUT.                [name].    Your procedure is scheduled on: Monday 8/10   Report to [facility]  Entrance  Report to admitting at  12:15 PM   1 VISITOR IS ALLOWED TO WAIT IN WAITING ROOM  ONLY DAY OF YOUR SURGERY. No family in Short Stay   Call this number if you have problems the morning of surgery [phone]    Remember: Do not eat food or drink liquids :After Midnight.   BRUSH YOUR TEETH MORNING OF SURGERY AND RINSE YOUR MOUTH OUT, NO CHEWING GUM CANDY OR MINTS.     Take these medicines the morning of surgery with A SIP OF WATER   :Megace                                  You may not have any metal on your body including piercings             Do not wear jewelry, lotions, powders or, deodorant             Do not wear nail polish.  Do not shave  48 hours prior to surgery.              Men may shave face and neck.   Do not bring valuables to the hospital. [place].  Contacts, dentures or bridgework may not be worn into surgery.      Patients discharged the day of surgery will not be allowed to drive home.  IF YOU ARE HAVING SURGERY AND GOING HOME THE SAME DAY, YOU MUST HAVE AN ADULT TO DRIVE YOU HOME AND BE WITH YOU FOR 24 HOURS.  YOU MAY GO HOME BY TAXI OR UBER OR ORTHERWISE, BUT AN ADULT MUST ACCOMPANY YOU HOME AND STAY WITH YOU FOR 24 HOURS.  Name and phone number of your driver:  Special Instructions: N/A              Please read over the following fact sheets you were given: _____________________________________________________________________             [facility] - Preparing for Surgery Before surgery, you can play an important  role .  Because skin is not sterile, your skin needs to be as free of germs as possible.   You can reduce the number of germs on your skin by washing with CHG (chlorahexidine gluconate) soap before surgery.   CHG is an antiseptic cleaner which kills germs and bonds with the skin to continue killing germs even after washing. Please DO NOT use if you have an allergy to CHG or antibacterial soaps .  If your skin becomes reddened/irritated stop using the CHG and inform your nurse when you arrive at Short Stay. .  You may shave your face/neck. Please follow these instructions carefully:  1.  Shower with CHG Soap the night before surgery and the  morning of Surgery.  2.  If you choose to wash your hair, wash your hair  first as usual with your  normal  shampoo.  3.  After you shampoo, rinse your hair and body thoroughly to remove the  shampoo.                                        4.  Use CHG as you would any other liquid soap.  You can apply chg directly  to the skin and wash                       Gently with a scrungie or clean washcloth.  5.  Apply the CHG Soap to your body ONLY FROM THE NECK DOWN.   Do not use on face/ open                           Wound or open sores. Avoid contact with eyes, ears mouth and genitals (private parts).                       Wash face,  Genitals (private parts) with your normal soap.             6.  Wash thoroughly, paying special attention to the area where your surgery  will be performed.  7.  Thoroughly rinse your body with warm water from the neck down.  8.  DO NOT shower/wash with your normal soap after using and rinsing off  the CHG Soap.              9.  Pat yourself dry with a clean towel.            10.  Wear clean pajamas.            11.  Place clean sheets on your bed the night of your first shower and do not  sleep with pets. Day of Surgery : Do not apply any lotions/deodorants the morning of surgery.  Please wear clean clothes to the hospital/surgery  center.  FAILURE TO FOLLOW THESE INSTRUCTIONS MAY RESULT IN THE CANCELLATION OF YOUR SURGERY PATIENT SIGNATURE_________________________________  NURSE SIGNATURE__________________________________  ________________________________________________________________________

## 2018-08-10 ENCOUNTER — Encounter (HOSPITAL_COMMUNITY)
Admission: RE | Admit: 2018-08-10 | Discharge: 2018-08-10 | Disposition: A | Discharge status: Home / Self Care | Payer: Medicare Other | Source: Ambulatory Visit | Attending: Surgery | Admitting: Surgery

## 2018-08-10 ENCOUNTER — Other Ambulatory Visit: Payer: Self-pay

## 2018-08-10 ENCOUNTER — Encounter (HOSPITAL_COMMUNITY): Payer: Self-pay

## 2018-08-10 DIAGNOSIS — Z87442 Personal history of urinary calculi: Secondary | ICD-10-CM | POA: Diagnosis not present

## 2018-08-10 DIAGNOSIS — I10 Essential (primary) hypertension: Secondary | ICD-10-CM | POA: Diagnosis not present

## 2018-08-10 DIAGNOSIS — I739 Peripheral vascular disease, unspecified: Secondary | ICD-10-CM | POA: Insufficient documentation

## 2018-08-10 DIAGNOSIS — Z01818 Encounter for other preprocedural examination: Secondary | ICD-10-CM | POA: Insufficient documentation

## 2018-08-10 DIAGNOSIS — D649 Anemia, unspecified: Secondary | ICD-10-CM | POA: Insufficient documentation

## 2018-08-10 DIAGNOSIS — Z20828 Contact with and (suspected) exposure to other viral communicable diseases: Secondary | ICD-10-CM | POA: Insufficient documentation

## 2018-08-10 DIAGNOSIS — C189 Malignant neoplasm of colon, unspecified: Secondary | ICD-10-CM | POA: Diagnosis not present

## 2018-08-10 DIAGNOSIS — Z79899 Other long term (current) drug therapy: Secondary | ICD-10-CM | POA: Insufficient documentation

## 2018-08-10 DIAGNOSIS — Z7982 Long term (current) use of aspirin: Secondary | ICD-10-CM | POA: Insufficient documentation

## 2018-08-10 LAB — CBC
HCT: 30.2 % — ABNORMAL LOW (ref 39.0–52.0)
Hemoglobin: 9.3 g/dL — ABNORMAL LOW (ref 13.0–17.0)
MCH: 23.3 pg — ABNORMAL LOW (ref 26.0–34.0)
MCHC: 30.8 g/dL (ref 30.0–36.0)
MCV: 75.7 fL — ABNORMAL LOW (ref 80.0–100.0)
Platelets: 649 10*3/uL — ABNORMAL HIGH (ref 150–400)
RBC: 3.99 MIL/uL — ABNORMAL LOW (ref 4.22–5.81)
RDW: 19.2 % — ABNORMAL HIGH (ref 11.5–15.5)
WBC: 14.9 10*3/uL — ABNORMAL HIGH (ref 4.0–10.5)
nRBC: 0 % (ref 0.0–0.2)

## 2018-08-10 LAB — BASIC METABOLIC PANEL
Anion gap: 11 (ref 5–15)
BUN: 14 mg/dL (ref 8–23)
CO2: 20 mmol/L — ABNORMAL LOW (ref 22–32)
Calcium: 9.1 mg/dL (ref 8.9–10.3)
Chloride: 105 mmol/L (ref 98–111)
Creatinine, Ser: 1.01 mg/dL (ref 0.61–1.24)
GFR calc Af Amer: >60 mL/min (ref >60)
GFR calc non Af Amer: >60 mL/min (ref >60)
Glucose, Bld: 118 mg/dL — ABNORMAL HIGH (ref 70–99)
Potassium: 4.1 mmol/L (ref 3.5–5.1)
Sodium: 136 mmol/L (ref 135–145)

## 2018-08-11 ENCOUNTER — Other Ambulatory Visit (HOSPITAL_COMMUNITY)
Admission: RE | Admit: 2018-08-11 | Discharge: 2018-08-11 | Disposition: A | Discharge status: Home / Self Care | Payer: Medicare Other | Source: Ambulatory Visit | Attending: Surgery | Admitting: Surgery

## 2018-08-11 DIAGNOSIS — I10 Essential (primary) hypertension: Secondary | ICD-10-CM | POA: Diagnosis not present

## 2018-08-11 DIAGNOSIS — I739 Peripheral vascular disease, unspecified: Secondary | ICD-10-CM | POA: Diagnosis not present

## 2018-08-11 DIAGNOSIS — Z87442 Personal history of urinary calculi: Secondary | ICD-10-CM | POA: Diagnosis not present

## 2018-08-11 DIAGNOSIS — Z20828 Contact with and (suspected) exposure to other viral communicable diseases: Secondary | ICD-10-CM | POA: Diagnosis not present

## 2018-08-11 DIAGNOSIS — Z01818 Encounter for other preprocedural examination: Secondary | ICD-10-CM | POA: Diagnosis not present

## 2018-08-11 DIAGNOSIS — C189 Malignant neoplasm of colon, unspecified: Secondary | ICD-10-CM | POA: Diagnosis not present

## 2018-08-11 LAB — SARS CORONAVIRUS 2 (TAT 6-24 HRS): SARS Coronavirus 2: NEGATIVE

## 2018-08-11 NOTE — Progress Notes (Signed)
Xaralto and ASA stopped 8/7 per Dr. Hassell Done

## 2018-08-12 NOTE — Anesthesia Preprocedure Evaluation (Addendum)
Anesthesia Evaluation  Patient identified by MRN, date of birth, ID band Patient awake    Reviewed: Allergy & Precautions, NPO status , Patient's Chart, lab work & pertinent test results  Airway Mallampati: II  TM Distance: >3 FB Neck ROM: Full    Dental  (+) Caps, Missing, Edentulous Upper   Pulmonary neg pulmonary ROS,    Pulmonary exam normal breath sounds clear to auscultation       Cardiovascular hypertension, Pt. on medications + Peripheral Vascular Disease  Normal cardiovascular exam Rhythm:Regular Rate:Normal     Neuro/Psych TIAnegative psych ROS   GI/Hepatic Neg liver ROS, PUD, Metastatic colon cancer   Endo/Other  negative endocrine ROS  Renal/GU Hx/o renal calculi  negative genitourinary   Musculoskeletal negative musculoskeletal ROS (+)   Abdominal   Peds negative pediatric ROS (+)  Hematology  (+) anemia ,   Anesthesia Other Findings   Reproductive/Obstetrics negative OB ROS                           Anesthesia Physical Anesthesia Plan  ASA: III  Anesthesia Plan: General   Post-op Pain Management:    Induction: Intravenous  PONV Risk Score and Plan: 2 and Ondansetron, Dexamethasone and Treatment may vary due to age or medical condition  Airway Management Planned: LMA  Additional Equipment:   Intra-op Plan:   Post-operative Plan: Extubation in OR  Informed Consent: I have reviewed the patients History and Physical, chart, labs and discussed the procedure including the risks, benefits and alternatives for the proposed anesthesia with the patient or authorized representative who has indicated his/her understanding and acceptance.     Dental advisory given  Plan Discussed with: CRNA and Surgeon  Anesthesia Plan Comments: (See PAT note 08/10/2018, Konrad Felix, PA-C)       Anesthesia Quick Evaluation

## 2018-08-12 NOTE — Progress Notes (Signed)
Anesthesia Chart Review   Case: [id number] Date/Time: 08/15/18 1400   Procedure: PORT A CATH PLACEMENT WITH POSSIBLE ULTRASOUND (N/A )   Anesthesia type: General   Pre-op diagnosis: METASTATIC COLON CANCER   [facility]: [facility]   Surgeon: Cody Hausen, MD      DISCUSSION:73 y.o. never smoker with h/o HTN, PVD, anemia, PAD, metastatic colon cancer scheduled for above procedure 08/15/2018 with Dr. Johnathan Suarez.   Cleared by cardiology 07/27/2018.  Per Cody Drown, NP, "Cody Suarez. was last seen on 07/06/2017 by Cody Suarez.  Since that day, Cody Suarez. has done has done well from a cardiac perspective. While we typically would prefer to see the patient in the office given the duration of time since we last saw him, this is an urgent request for port a cath placement to start chemotherapy treatment. I spoke to Cody Suarez today and he denies chest pain, SOB or claudication symptoms. He does have a scheduled follow up appointment with Cody Suarez on 08/23/2018 in which he was encouraged to keep.  Per Cody Suarez, he may hold Xarelto for two days prior to procedure and resume as soon as possible per procedure team.  Therefore, based on ACC/AHA guidelines, the patient would be at acceptable risk for the planned procedure without further cardiovascular testing."  Pt with recent admission 7/26-7/28/2020 due to TIA vs. Metabolic encephalopathy in setting of anemia and dehydration.  PRBC received, hemoglobin improved from 6.2 at admission to 9.3 at discharge.  Sx resolved, discharged on Fe.  Stable at PAT.  During hospital admission Xarelto stopped.  Per Cody Suarez, "This was discussed w/hospitalist and hospitalist discussed w/vascular surgery and it was decided that since he does not have a fib and it has been so long since the DVT he did not need to continue Xarelto as long as he was on daily aspirin. Are also concerned about increased risk for bleeding due to tumor in  liver."  Anticipate pt can proceed with planned procedure barring acute status change.   VS: BP (!) 151/54 (BP Location: Left Arm)   Pulse (!) 117   Temp 36.8 C (Oral)   Resp 18   Ht 5' 10"  (1.778 m)   Wt 69.4 kg   SpO2 100%   BMI 21.96 kg/m   PROVIDERS: Cody Minium, MD is PCP   Cody Sacramento, MD is Cardiologist   Cody Coder, MD is Oncologist  LABS: Labs reviewed: Acceptable for surgery. (all labs ordered are listed, but only abnormal results are displayed)  Labs Reviewed  BASIC METABOLIC PANEL - Abnormal; Notable for the following components:      Result Value   CO2 20 (*)    Glucose, Bld 118 (*)    All other components within normal limits  CBC - Abnormal; Notable for the following components:   WBC 14.9 (*)    RBC 3.99 (*)    Hemoglobin 9.3 (*)    HCT 30.2 (*)    MCV 75.7 (*)    MCH 23.3 (*)    RDW 19.2 (*)    Platelets 649 (*)    All other components within normal limits     IMAGES:   EKG: 08/01/2018 Rate 117 sinus tachycardia Ventricular premature complex Aberrant conduction of SV complex Left axis deviation  Low voltage, extremity and precordial leads Borderline prolonged QT interval Baseline wander in leads II III aVF  CV: Echo 08/01/2018 IMPRESSIONS    1. The left  ventricle has normal systolic function with an ejection fraction of 60-65%. The cavity size was normal. Left ventricular diastolic Doppler parameters are consistent with impaired relaxation.  2. The right ventricle has normal systolic function. The cavity was normal. There is no increase in right ventricular wall thickness.  FINDINGS  Left Ventricle: The left ventricle has normal systolic function, with an ejection fraction of 60-65%. The cavity size was normal. There is no increase in left ventricular wall thickness. Left ventricular diastolic Doppler parameters are consistent with  impaired relaxation. Past Medical History:  Diagnosis Date  . Anemia of chronic  disease 2015   "related to cancer tx" (08/09/2013)  . Blood clot in vein 2015  . Colon cancer ([place]) 03/2013  . History of kidney stones    x 2passed them both  . Hypertension   . Peripheral vascular disease ([place])   . Ulcerative colitis ([place])    history    Past Surgical History:  Procedure Laterality Date  . ABDOMINAL AORTAGRAM N/A 08/09/2013   Procedure: ABDOMINAL Maxcine Ham;  Surgeon: [name], MD;  Location: [facility] CATH LAB;  Service: Cardiovascular;  Laterality: N/A;  . CHOLECYSTECTOMY N/A 09/08/2014   Procedure: LAPAROSCOPIC CHOLECYSTECTOMY WITH INTRAOPERATIVE CHOLANGIOGRAM;  Surgeon: Armandina Gemma, MD;  [facility];  Service: General;  Laterality: N/A;  . COLON SURGERY  02/2013  . COLON SURGERY  10/2015  . FEMORAL-POPLITEAL BYPASS GRAFT Right 08/11/2013   Procedure:   RIGHT - POPLITEAL TO PERONEAL ARTERY BYPASS GRAFT  WITH NONREVERSED SAPHENOUS VEIN GRAFT,tHROMBECTOMY ANTERIOR TIBIALIS,ATTEMPTED THROMBECTOMY TIBIO-PERONEAL TRUNK AND POSTERIOR TIBIALIS, INTRAOPERATIVE ARTERIOGRAM.;  Surgeon: Mal Misty, MD;  Location: [facility];  Service: Vascular;  Laterality: Right;  . FEMORAL-TIBIAL BYPASS GRAFT Right 11/17/2013   Procedure: BYPASS GRAFT RIGHT ABOVE KNEE POPLITEAL TO POSTERIOR TIBIAL ARTERY USING RIGHT NON-REVERSED CEPHALIC VEIN;  Surgeon: Mal Misty, MD;  Location: [facility];  Service: Vascular;  Laterality: Right;  . HERNIA REPAIR  ~ 1995; 03/2013   UHR  . INTRAOPERATIVE ARTERIOGRAM Right 11/17/2013   Procedure: INTRA OPERATIVE ARTERIOGRAM;  Surgeon: Mal Misty, MD;  Location: [facility] OR;  Service: Vascular;  Laterality: Right;  . IR GENERIC HISTORICAL  12/11/2015   IR RADIOLOGIST EVAL & MGMT 12/11/2015 Arne Cleveland, MD [facility]  . IR GENERIC HISTORICAL  12/24/2015   IR RADIOLOGIST EVAL & MGMT 12/24/2015 Jacqulynn Cadet, MD [facility]  . IR GENERIC HISTORICAL  01/21/2016   IR RADIOLOGIST EVAL & MGMT 01/21/2016 [name], MD [facility]  . IR GENERIC  HISTORICAL  02/04/2016   IR RADIOLOGIST EVAL & MGMT 02/04/2016 Sandi Mariscal, MD [facility]  . IR GENERIC HISTORICAL  02/12/2016   IR CATHETER TUBE CHANGE 02/12/2016 Corrie Mckusick, DO [facility]  . IR GENERIC HISTORICAL  02/20/2016   IR RADIOLOGIST EVAL & MGMT 02/20/2016 Markus Daft, MD [facility]  . IR SINUS/FIST TUBE CHK-NON GI  01/04/2018  . LAPAROSCOPIC RIGHT HEMI COLECTOMY N/A 03/28/2013   Procedure: LAPAROSCOPIC ASSISTED HEMI COLECTOMY;  Surgeon: Pedro Earls, MD;  [facility];  Service: General;  Laterality: N/A;  . LAPAROSCOPY N/A 11/05/2015   Procedure: LAPAROSCOPY DIAGNOSTIC;  Surgeon: Cody Hausen, MD;  [facility];  Service: General;  Laterality: N/A;  . LAPAROTOMY N/A 11/05/2015   Procedure: EXPLORATORY LAPAROTOMY, resection of mass at tail of pancreas;  Surgeon: Cody Hausen, MD;  [facility];  Service: General;  Laterality: N/A;  . LOWER EXTREMITY ANGIOGRAM Bilateral 08/09/2013  . TEE  WITHOUT CARDIOVERSION N/A 08/10/2013   Procedure: TRANSESOPHAGEAL ECHOCARDIOGRAM (TEE);  Surgeon: Candee Furbish, MD;  Location: [facility] ENDOSCOPY;  Service: Cardiovascular;  Laterality: N/A;  . TONSILLECTOMY  ~ 1950  . [place]; 03/2013  . VASECTOMY    . VEIN HARVEST Right 11/17/2013   Procedure: HARVEST OF RIGHT UPPER EXTREMITY CEPHALIC VEIN;  Surgeon: Mal Misty, MD;  Location: [facility];  Service: Vascular;  Laterality: Right;    MEDICATIONS: . acetaminophen (TYLENOL) 500 MG tablet  . aspirin EC 81 MG tablet  . calcium carbonate (TUMS EX) 750 MG chewable tablet  . Coenzyme Q10 (COQ10 PO)  . ferrous sulfate 325 (65 FE) MG tablet  . Homeopathic Products ([place])  . HYDROcodone-acetaminophen (NORCO/VICODIN) 5-325 MG tablet  . Liniments (BLUE-EMU SUPER STRENGTH EX)  . losartan (COZAAR) 100 MG tablet  . megestrol (MEGACE) 40 MG tablet  . Multiple Vitamin (MULTIVITAMIN WITH MINERALS) TABS tablet  . ondansetron (ZOFRAN) 4 MG tablet  .  spironolactone (ALDACTONE) 25 MG tablet  . sulfaSALAzine (AZULFIDINE) 500 MG EC tablet  . zolpidem (AMBIEN) 5 MG tablet   No current facility-administered medications for this encounter.      Maia Plan [facility] Pre-Surgical Testing [phone] 08/12/18  10:27 AM

## 2018-08-14 ENCOUNTER — Other Ambulatory Visit: Payer: Self-pay | Admitting: Oncology

## 2018-08-15 ENCOUNTER — Ambulatory Visit (HOSPITAL_COMMUNITY): Payer: Medicare Other | Admitting: Anesthesiology

## 2018-08-15 ENCOUNTER — Encounter (HOSPITAL_COMMUNITY): Payer: Self-pay

## 2018-08-15 ENCOUNTER — Ambulatory Visit (HOSPITAL_COMMUNITY): Payer: Medicare Other

## 2018-08-15 ENCOUNTER — Ambulatory Visit (HOSPITAL_COMMUNITY): Payer: Medicare Other | Admitting: Physician Assistant

## 2018-08-15 ENCOUNTER — Ambulatory Visit (HOSPITAL_COMMUNITY)
Admission: RE | Admit: 2018-08-15 | Discharge: 2018-08-15 | Disposition: A | Discharge status: Home / Self Care | Payer: Medicare Other | Attending: Surgery | Admitting: Surgery

## 2018-08-15 ENCOUNTER — Encounter (HOSPITAL_COMMUNITY)
Admission: RE | Disposition: A | Discharge status: Home / Self Care | Payer: Self-pay | Source: Home / Self Care | Attending: Surgery

## 2018-08-15 ENCOUNTER — Other Ambulatory Visit: Payer: Self-pay

## 2018-08-15 DIAGNOSIS — Z6821 Body mass index (BMI) 21.0-21.9, adult: Secondary | ICD-10-CM | POA: Insufficient documentation

## 2018-08-15 DIAGNOSIS — E785 Hyperlipidemia, unspecified: Secondary | ICD-10-CM | POA: Diagnosis not present

## 2018-08-15 DIAGNOSIS — I739 Peripheral vascular disease, unspecified: Secondary | ICD-10-CM | POA: Diagnosis not present

## 2018-08-15 DIAGNOSIS — Z825 Family history of asthma and other chronic lower respiratory diseases: Secondary | ICD-10-CM | POA: Diagnosis not present

## 2018-08-15 DIAGNOSIS — Z8249 Family history of ischemic heart disease and other diseases of the circulatory system: Secondary | ICD-10-CM | POA: Diagnosis not present

## 2018-08-15 DIAGNOSIS — Z87442 Personal history of urinary calculi: Secondary | ICD-10-CM | POA: Diagnosis not present

## 2018-08-15 DIAGNOSIS — Z7982 Long term (current) use of aspirin: Secondary | ICD-10-CM | POA: Diagnosis not present

## 2018-08-15 DIAGNOSIS — C7889 Secondary malignant neoplasm of other digestive organs: Secondary | ICD-10-CM | POA: Diagnosis not present

## 2018-08-15 DIAGNOSIS — Z79899 Other long term (current) drug therapy: Secondary | ICD-10-CM | POA: Insufficient documentation

## 2018-08-15 DIAGNOSIS — Z9049 Acquired absence of other specified parts of digestive tract: Secondary | ICD-10-CM | POA: Diagnosis not present

## 2018-08-15 DIAGNOSIS — C19 Malignant neoplasm of rectosigmoid junction: Secondary | ICD-10-CM | POA: Diagnosis not present

## 2018-08-15 DIAGNOSIS — E440 Moderate protein-calorie malnutrition: Secondary | ICD-10-CM | POA: Diagnosis not present

## 2018-08-15 DIAGNOSIS — D630 Anemia in neoplastic disease: Secondary | ICD-10-CM | POA: Diagnosis not present

## 2018-08-15 DIAGNOSIS — I10 Essential (primary) hypertension: Secondary | ICD-10-CM | POA: Insufficient documentation

## 2018-08-15 DIAGNOSIS — Z09 Encounter for follow-up examination after completed treatment for conditions other than malignant neoplasm: Secondary | ICD-10-CM

## 2018-08-15 DIAGNOSIS — Z8673 Personal history of transient ischemic attack (TIA), and cerebral infarction without residual deficits: Secondary | ICD-10-CM | POA: Insufficient documentation

## 2018-08-15 DIAGNOSIS — C189 Malignant neoplasm of colon, unspecified: Secondary | ICD-10-CM | POA: Diagnosis not present

## 2018-08-15 DIAGNOSIS — Z452 Encounter for adjustment and management of vascular access device: Secondary | ICD-10-CM | POA: Diagnosis not present

## 2018-08-15 DIAGNOSIS — Z8507 Personal history of malignant neoplasm of pancreas: Secondary | ICD-10-CM | POA: Diagnosis not present

## 2018-08-15 HISTORY — PX: PORTACATH PLACEMENT: SHX2246

## 2018-08-15 SURGERY — INSERTION, TUNNELED CENTRAL VENOUS DEVICE, WITH PORT
Anesthesia: General | Site: Chest | Laterality: Left

## 2018-08-15 MED ORDER — CHLORHEXIDINE GLUCONATE CLOTH 2 % EX PADS: 6.0000 | MEDICATED_PAD | Freq: Once | CUTANEOUS | Status: DC

## 2018-08-15 MED ORDER — LIDOCAINE HCL (PF) 1 % IJ SOLN
INTRAMUSCULAR | Status: AC
  Filled 2018-08-15: qty 30

## 2018-08-15 MED ORDER — ONDANSETRON HCL 4 MG/2ML IJ SOLN
INTRAMUSCULAR | Status: AC
  Filled 2018-08-15: qty 2

## 2018-08-15 MED ORDER — ACETAMINOPHEN 160 MG/5ML PO SOLN: 325.0000 mg | ORAL | Status: DC | PRN

## 2018-08-15 MED ORDER — ONDANSETRON HCL 4 MG/2ML IJ SOLN: 4.0000 mg | Freq: Once | INTRAMUSCULAR | Status: DC | PRN

## 2018-08-15 MED ORDER — MEPERIDINE HCL 50 MG/ML IJ SOLN: 6.2500 mg | INTRAMUSCULAR | Status: DC | PRN

## 2018-08-15 MED ORDER — BUPIVACAINE-EPINEPHRINE (PF) 0.25% -1:200000 IJ SOLN
INTRAMUSCULAR | Status: AC
  Filled 2018-08-15: qty 30

## 2018-08-15 MED ORDER — HEPARIN SOD (PORK) LOCK FLUSH 100 UNIT/ML IV SOLN
INTRAVENOUS | Status: DC | PRN
  Administered 2018-08-15: 500 [IU]

## 2018-08-15 MED ORDER — HEPARIN SODIUM (PORCINE) 5000 UNIT/ML IJ SOLN
5000.0000 [IU] | Freq: Once | INTRAMUSCULAR | Status: AC
  Administered 2018-08-15: 5000 [IU] via SUBCUTANEOUS
  Filled 2018-08-15: qty 1

## 2018-08-15 MED ORDER — SODIUM CHLORIDE 0.9 % IV SOLN
Freq: Once | INTRAVENOUS | Status: AC
  Administered 2018-08-15: 500 mL
  Filled 2018-08-15: qty 1.2

## 2018-08-15 MED ORDER — LACTATED RINGERS IV SOLN
INTRAVENOUS | Status: DC
  Administered 2018-08-15: 13:00:00 via INTRAVENOUS

## 2018-08-15 MED ORDER — LIDOCAINE 2% (20 MG/ML) 5 ML SYRINGE
INTRAMUSCULAR | Status: DC | PRN
  Administered 2018-08-15: 60 mg via INTRAVENOUS

## 2018-08-15 MED ORDER — LIDOCAINE 2% (20 MG/ML) 5 ML SYRINGE
INTRAMUSCULAR | Status: AC
  Filled 2018-08-15: qty 5

## 2018-08-15 MED ORDER — FENTANYL CITRATE (PF) 100 MCG/2ML IJ SOLN
INTRAMUSCULAR | Status: DC | PRN
  Administered 2018-08-15 (×4): 25 ug via INTRAVENOUS

## 2018-08-15 MED ORDER — BUPIVACAINE-EPINEPHRINE 0.25% -1:200000 IJ SOLN
INTRAMUSCULAR | Status: DC | PRN
  Administered 2018-08-15: 6 mL

## 2018-08-15 MED ORDER — DEXAMETHASONE SODIUM PHOSPHATE 10 MG/ML IJ SOLN
INTRAMUSCULAR | Status: DC | PRN
  Administered 2018-08-15: 10 mg via INTRAVENOUS

## 2018-08-15 MED ORDER — 0.9 % SODIUM CHLORIDE (POUR BTL) OPTIME
TOPICAL | Status: DC | PRN
  Administered 2018-08-15: 1000 mL

## 2018-08-15 MED ORDER — FENTANYL CITRATE (PF) 100 MCG/2ML IJ SOLN
INTRAMUSCULAR | Status: AC
  Filled 2018-08-15: qty 2

## 2018-08-15 MED ORDER — DEXAMETHASONE SODIUM PHOSPHATE 10 MG/ML IJ SOLN
INTRAMUSCULAR | Status: AC
  Filled 2018-08-15: qty 1

## 2018-08-15 MED ORDER — FENTANYL CITRATE (PF) 100 MCG/2ML IJ SOLN: 25.0000 ug | INTRAMUSCULAR | Status: DC | PRN

## 2018-08-15 MED ORDER — PROPOFOL 10 MG/ML IV BOLUS
INTRAVENOUS | Status: DC | PRN
  Administered 2018-08-15: 150 mg via INTRAVENOUS

## 2018-08-15 MED ORDER — HYDROCODONE-ACETAMINOPHEN 5-325 MG PO TABS: 1.0000 | ORAL_TABLET | Freq: Four times a day (QID) | ORAL | 0 refills | Status: DC | PRN

## 2018-08-15 MED ORDER — CEFAZOLIN SODIUM-DEXTROSE 2-4 GM/100ML-% IV SOLN
2.0000 g | INTRAVENOUS | Status: AC
  Administered 2018-08-15: 13:00:00 2 g via INTRAVENOUS
  Filled 2018-08-15: qty 100

## 2018-08-15 MED ORDER — PROPOFOL 10 MG/ML IV BOLUS
INTRAVENOUS | Status: AC
  Filled 2018-08-15: qty 40

## 2018-08-15 MED ORDER — ONDANSETRON HCL 4 MG/2ML IJ SOLN
INTRAMUSCULAR | Status: DC | PRN
  Administered 2018-08-15: 4 mg via INTRAVENOUS

## 2018-08-15 MED ORDER — OXYCODONE HCL 5 MG/5ML PO SOLN: 5.0000 mg | Freq: Once | ORAL | Status: DC | PRN

## 2018-08-15 MED ORDER — OXYCODONE HCL 5 MG PO TABS: 5.0000 mg | ORAL_TABLET | Freq: Once | ORAL | Status: DC | PRN

## 2018-08-15 MED ORDER — HEPARIN SOD (PORK) LOCK FLUSH 100 UNIT/ML IV SOLN
INTRAVENOUS | Status: AC
  Filled 2018-08-15: qty 5

## 2018-08-15 MED ORDER — ACETAMINOPHEN 325 MG PO TABS: 325.0000 mg | ORAL_TABLET | ORAL | Status: DC | PRN

## 2018-08-15 SURGICAL SUPPLY — 40 items
BAG DECANTER FOR FLEXI CONT (MISCELLANEOUS) ×3 IMPLANT
BENZOIN TINCTURE PRP APPL 2/3 (GAUZE/BANDAGES/DRESSINGS) IMPLANT
BLADE HEX COATED 2.75 (ELECTRODE) ×3 IMPLANT
BLADE SURG 15 STRL LF DISP TIS (BLADE) ×1 IMPLANT
BLADE SURG 15 STRL SS (BLADE) ×2
CLOSURE WOUND 1/2 X4 (GAUZE/BANDAGES/DRESSINGS)
COVER SURGICAL LIGHT HANDLE (MISCELLANEOUS) ×3 IMPLANT
COVER WAND RF STERILE (DRAPES) IMPLANT
DECANTER SPIKE VIAL GLASS SM (MISCELLANEOUS) ×3 IMPLANT
DERMABOND ADVANCED (GAUZE/BANDAGES/DRESSINGS) ×2
DERMABOND ADVANCED .7 DNX12 (GAUZE/BANDAGES/DRESSINGS) ×1 IMPLANT
DRAPE C-ARM 42X120 X-RAY (DRAPES) ×3 IMPLANT
DRAPE LAPAROTOMY T 98X78 PEDS (DRAPES) ×3 IMPLANT
ELECT PENCIL ROCKER SW 15FT (MISCELLANEOUS) ×3 IMPLANT
ELECT REM PT RETURN 15FT ADLT (MISCELLANEOUS) ×3 IMPLANT
GAUZE 4X4 16PLY RFD (DISPOSABLE) ×3 IMPLANT
GAUZE SPONGE 2X2 8PLY STRL LF (GAUZE/BANDAGES/DRESSINGS) IMPLANT
GAUZE SPONGE 4X4 12PLY STRL (GAUZE/BANDAGES/DRESSINGS) IMPLANT
GLOVE BIOGEL M 8.0 STRL (GLOVE) ×3 IMPLANT
GLOVE BIOGEL PI IND STRL 7.0 (GLOVE) ×1 IMPLANT
GLOVE BIOGEL PI INDICATOR 7.0 (GLOVE) ×2
GOWN SPEC L4 XLG W/TWL (GOWN DISPOSABLE) ×3 IMPLANT
GOWN STRL REUS W/TWL XL LVL3 (GOWN DISPOSABLE) ×9 IMPLANT
KIT BASIN OR (CUSTOM PROCEDURE TRAY) ×3 IMPLANT
KIT PORT POWER 8FR ISP CVUE (Port) ×3 IMPLANT
KIT TURNOVER KIT A (KITS) IMPLANT
NEEDLE HYPO 22GX1.5 SAFETY (NEEDLE) ×3 IMPLANT
NS IRRIG 1000ML POUR BTL (IV SOLUTION) ×3 IMPLANT
PACK BASIC VI WITH GOWN DISP (CUSTOM PROCEDURE TRAY) ×3 IMPLANT
SPONGE GAUZE 2X2 STER 10/PKG (GAUZE/BANDAGES/DRESSINGS)
STRIP CLOSURE SKIN 1/2X4 (GAUZE/BANDAGES/DRESSINGS) IMPLANT
SUT PROLENE 2 0 CT2 30 (SUTURE) ×6 IMPLANT
SUT PROLENE 2 0 SH DA (SUTURE) IMPLANT
SUT VIC AB 4-0 SH 18 (SUTURE) ×6 IMPLANT
SYR 10ML ECCENTRIC (SYRINGE) ×3 IMPLANT
SYR 10ML LL (SYRINGE) ×3 IMPLANT
SYR BULB IRRIGATION 50ML (SYRINGE) IMPLANT
SYR CONTROL 10ML LL (SYRINGE) ×3 IMPLANT
TOWEL OR 17X26 10 PK STRL BLUE (TOWEL DISPOSABLE) ×3 IMPLANT
TOWEL OR NON WOVEN STRL DISP B (DISPOSABLE) ×3 IMPLANT

## 2018-08-15 NOTE — Transfer of Care (Signed)
Immediate Anesthesia Transfer of Care Note  Patient: Cody Suarez.  Procedure(s) Performed: PORT A CATH PLACEMENT (Left Chest)  Patient Location: PACU  Anesthesia Type:General  Level of Consciousness: awake, alert  and oriented  Airway & Oxygen Therapy: Patient Spontanous Breathing and Patient connected to face mask oxygen  Post-op Assessment: Report given to RN and Post -op Vital signs reviewed and stable  Post vital signs: Reviewed and stable  Last Vitals:  Vitals Value Taken Time  BP    Temp    Pulse 91 08/15/18 1425  Resp    SpO2 100 % 08/15/18 1425  Vitals shown include unvalidated device data.  Last Pain:  Vitals:   08/15/18 1241  TempSrc:   PainSc: 0-No pain         Complications: No apparent anesthesia complications

## 2018-08-15 NOTE — H&P (Signed)
Chief Complaint:  Need for IV access  History of Present Illness:  Cody Ibe. is an 73 y.o. male who is about to undergo more chemotherapy for recurrent cancer in the left upper quadrant.  IV access needed and portacath desired.    Past Medical History:  Diagnosis Date  . Anemia of chronic disease 2015   "related to cancer tx" (08/09/2013)  . Blood clot in vein 2015  . Colon cancer ([place]) 03/2013  . History of kidney stones    x 2passed them both  . Hypertension   . Peripheral vascular disease ([place])   . Ulcerative colitis ([place])    history    Past Surgical History:  Procedure Laterality Date  . ABDOMINAL AORTAGRAM N/A 08/09/2013   Procedure: ABDOMINAL Maxcine Ham;  Surgeon: [name], MD;  Location: [facility] CATH LAB;  Service: Cardiovascular;  Laterality: N/A;  . CHOLECYSTECTOMY N/A 09/08/2014   Procedure: LAPAROSCOPIC CHOLECYSTECTOMY WITH INTRAOPERATIVE CHOLANGIOGRAM;  Surgeon: Armandina Gemma, MD;  [facility];  Service: General;  Laterality: N/A;  . COLON SURGERY  02/2013  . COLON SURGERY  10/2015  . FEMORAL-POPLITEAL BYPASS GRAFT Right 08/11/2013   Procedure:   RIGHT - POPLITEAL TO PERONEAL ARTERY BYPASS GRAFT  WITH NONREVERSED SAPHENOUS VEIN GRAFT,tHROMBECTOMY ANTERIOR TIBIALIS,ATTEMPTED THROMBECTOMY TIBIO-PERONEAL TRUNK AND POSTERIOR TIBIALIS, INTRAOPERATIVE ARTERIOGRAM.;  Surgeon: Mal Misty, MD;  Location: [facility];  Service: Vascular;  Laterality: Right;  . FEMORAL-TIBIAL BYPASS GRAFT Right 11/17/2013   Procedure: BYPASS GRAFT RIGHT ABOVE KNEE POPLITEAL TO POSTERIOR TIBIAL ARTERY USING RIGHT NON-REVERSED CEPHALIC VEIN;  Surgeon: Mal Misty, MD;  Location: [facility];  Service: Vascular;  Laterality: Right;  . HERNIA REPAIR  ~ 1995; 03/2013   UHR  . INTRAOPERATIVE ARTERIOGRAM Right 11/17/2013   Procedure: INTRA OPERATIVE ARTERIOGRAM;  Surgeon: Mal Misty, MD;  Location: [facility] OR;  Service: Vascular;  Laterality: Right;  . IR GENERIC HISTORICAL  12/11/2015   IR RADIOLOGIST EVAL  & MGMT 12/11/2015 Arne Cleveland, MD [facility]  . IR GENERIC HISTORICAL  12/24/2015   IR RADIOLOGIST EVAL & MGMT 12/24/2015 Jacqulynn Cadet, MD [facility]  . IR GENERIC HISTORICAL  01/21/2016   IR RADIOLOGIST EVAL & MGMT 01/21/2016 [name], MD [facility]  . IR GENERIC HISTORICAL  02/04/2016   IR RADIOLOGIST EVAL & MGMT 02/04/2016 Sandi Mariscal, MD [facility]  . IR GENERIC HISTORICAL  02/12/2016   IR CATHETER TUBE CHANGE 02/12/2016 Corrie Mckusick, DO [facility]  . IR GENERIC HISTORICAL  02/20/2016   IR RADIOLOGIST EVAL & MGMT 02/20/2016 Markus Daft, MD [facility]  . IR SINUS/FIST TUBE CHK-NON GI  01/04/2018  . LAPAROSCOPIC RIGHT HEMI COLECTOMY N/A 03/28/2013   Procedure: LAPAROSCOPIC ASSISTED HEMI COLECTOMY;  Surgeon: Pedro Earls, MD;  [facility];  Service: General;  Laterality: N/A;  . LAPAROSCOPY N/A 11/05/2015   Procedure: LAPAROSCOPY DIAGNOSTIC;  Surgeon: [name], MD;  [facility];  Service: General;  Laterality: N/A;  . LAPAROTOMY N/A 11/05/2015   Procedure: EXPLORATORY LAPAROTOMY, resection of mass at tail of pancreas;  Surgeon: [name], MD;  [facility];  Service: General;  Laterality: N/A;  . LOWER EXTREMITY ANGIOGRAM Bilateral 08/09/2013  . TEE WITHOUT CARDIOVERSION N/A 08/10/2013   Procedure: TRANSESOPHAGEAL ECHOCARDIOGRAM (TEE);  Surgeon: Candee Furbish, MD;  Location: [facility] ENDOSCOPY;  Service: Cardiovascular;  Laterality: N/A;  . TONSILLECTOMY  ~ 1950  . [place]; 03/2013  . VASECTOMY    .  VEIN HARVEST Right 11/17/2013   Procedure: HARVEST OF RIGHT UPPER EXTREMITY CEPHALIC VEIN;  Surgeon: Mal Misty, MD;  Location: [facility];  Service: Vascular;  Laterality: Right;    No current facility-administered medications for this encounter.    Current Outpatient Medications  Medication Sig Dispense Refill  . acetaminophen (TYLENOL) 500 MG tablet Take 1,000 mg by mouth daily as needed for mild pain or headache.      Marland Kitchen aspirin EC 81 MG tablet Take 1 tablet (81 mg total) by mouth daily. 90 tablet 3  . calcium carbonate (TUMS EX) 750 MG chewable tablet Chew 1-2 tablets by mouth daily as needed for heartburn.    . Coenzyme Q10 (COQ10 PO) Take 1 capsule by mouth daily.    . ferrous sulfate 325 (65 FE) MG tablet Take 1 tablet (325 mg total) by mouth 3 (three) times daily with meals. 90 tablet 2  . Homeopathic Products (THERAWORX RELIEF EX) Apply 1 application topically daily as needed (calf pain).    Marland Kitchen HYDROcodone-acetaminophen (NORCO/VICODIN) 5-325 MG tablet Take 1 tablet by mouth every 6 (six) hours as needed for moderate pain.     . Liniments (BLUE-EMU SUPER STRENGTH EX) Apply 1 application topically daily as needed (knee pain).    Marland Kitchen losartan (COZAAR) 100 MG tablet Take 0.5 tablets (50 mg total) by mouth daily. 45 tablet 2  . megestrol (MEGACE) 40 MG tablet Take 4 tablets (160 mg total) by mouth daily. (Patient taking differently: Take 40 mg by mouth daily. ) 30 tablet 11  . Multiple Vitamin (MULTIVITAMIN WITH MINERALS) TABS tablet Take 1 tablet by mouth daily.    . ondansetron (ZOFRAN) 4 MG tablet TAKE 1 TABLET BY MOUTH EVERY 8 HOURS AS NEEDED FOR NAUSEA FOR VOMITING (Patient taking differently: Take 4 mg by mouth every 8 (eight) hours as needed for nausea or vomiting. ) 20 tablet 3  . spironolactone (ALDACTONE) 25 MG tablet TAKE 1 TABLET BY MOUTH ONCE DAILY (Patient taking differently: Take 25 mg by mouth daily. ) 30 tablet 5  . sulfaSALAzine (AZULFIDINE) 500 MG EC tablet Take 1,500 mg by mouth daily.   3  . zolpidem (AMBIEN) 5 MG tablet Take 1 tablet (5 mg total) by mouth at bedtime as needed for sleep. 30 tablet 2   Amlodipine and Gabapentin Family History  Problem Relation Age of Onset  . Heart disease Mother   . Emphysema Father    Social History:   reports that he has never smoked. He has never used smokeless tobacco. He reports that he does not drink alcohol or use drugs.   REVIEW OF SYSTEMS  : Negative except for see problem list  Physical Exam:   There were no vitals taken for this visit. There is no height or weight on file to calculate BMI.  Gen:  WDWN WM NAD  Neurological: Alert and oriented to person, place, and time. Motor and sensory function is grossly intact  Head: Normocephalic and atraumatic.  Eyes: Conjunctivae are normal. Pupils are equal, round, and reactive to light. No scleral icterus.  Neck: Normal range of motion. Neck supple. No tracheal deviation or thyromegaly present.  Cardiovascular:  SR without murmurs or gallops.  No carotid bruits Breast:  Not examined Respiratory: Effort normal.  No respiratory distress. No chest wall tenderness. Breath sounds normal.  No wheezes, rales or rhonchi.  Abdomen:  nontender GU:  Not examined Musculoskeletal: Normal range of motion. Extremities are nontender. No cyanosis, edema or clubbing noted Lymphadenopathy: No  cervical, preauricular, postauricular or axillary adenopathy is present Skin: Skin is warm and dry. No rash noted. No diaphoresis. No erythema. No pallor. Pscyh: Normal mood and affect. Behavior is normal. Judgment and thought content normal.   LABORATORY RESULTS: No results found for this or any previous visit (from the past 48 hour(s)).   RADIOLOGY RESULTS: No results found.  Problem List: Patient Active Problem List   Diagnosis Date Noted  . Goals of care, counseling/discussion 08/02/2018  . TIA (transient ischemic attack) 08/01/2018  . Colon adenocarcinoma ([place])   . Metastatic adenocarcinoma ([place])   . Anemia 07/31/2018  . Speech abnormality 07/31/2018  . Malnutrition of moderate degree 01/21/2018  . Intra-abdominal abscess ([place]) 12/24/2017  . Hyperlipidemia 11/12/2017  . Erectile dysfunction 05/05/2017  . 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 History of kidney stones   . PAD (peripheral artery disease) ([place]) 01/22/2015  . Anemia of  chronic disease 09/06/2014  . 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 Atherosclerotic PVD with ulceration ([place]) 11/14/2013  . Atherosclerotic PVD with intermittent claudication ([place]) 10/31/2013  . 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 Hypertension   . 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 Right inguinal hernia 03/23/2013  . Umbilical hernia 40/98/1191  . Ulcerative colitis ([place]) 03/23/2013    Assessment & Plan: Recurrent colon cancer for chemotherapy and need of IV access.      Cody B. Hassell Done, MD, [facility] [phone] beeper [phone]  08/15/2018 8:48 AM

## 2018-08-15 NOTE — Discharge Instructions (Signed)
Implanted Port Insertion, Care After This sheet gives you information about how to care for yourself after your procedure. Your health care provider may also give you more specific instructions. If you have problems or questions, contact your health care provider. What can I expect after the procedure? After the procedure, it is common to have:  Discomfort at the port insertion site.  Bruising on the skin over the port. This should improve over 3-4 days. Follow these instructions at home: [facility] care  After your port is placed, you will get a manufacturer's information card. The card has information about your port. Keep this card with you at all times.  Take care of the port as told by your health care provider. Ask your health care provider if you or a family member can get training for taking care of the port at home. A home health care nurse may also take care of the port.  Make sure to remember what type of port you have. Incision care      Follow instructions from your health care provider about how to take care of your port insertion site. Make sure you: ? Wash your hands with soap and water before and after you change your bandage (dressing). If soap and water are not available, use hand sanitizer. ? Change your dressing as told by your health care provider. ? Leave stitches (sutures), skin glue, or adhesive strips in place. These skin closures may need to stay in place for 2 weeks or longer. If adhesive strip edges start to loosen and curl up, you may trim the loose edges. Do not remove adhesive strips completely unless your health care provider tells you to do that.  Check your port insertion site every day for signs of infection. Check for: ? Redness, swelling, or pain. ? Fluid or blood. ? Warmth. ? Pus or a bad smell. Activity  Return to your normal activities as told by your health care provider. Ask your health care provider what activities are safe for you.  Do not  lift anything that is heavier than 10 lb (4.5 kg), or the limit that you are told, until your health care provider says that it is safe. General instructions  Take over-the-counter and prescription medicines only as told by your health care provider.  Do not take baths, swim, or use a hot tub until your health care provider approves. Ask your health care provider if you may take showers. You may only be allowed to take sponge baths.  Do not drive for 24 hours if you were given a sedative during your procedure.  Wear a medical alert bracelet in case of an emergency. This will tell any health care providers that you have a port.  Keep all follow-up visits as told by your health care provider. This is important. Contact a health care provider if:  You cannot flush your port with saline as directed, or you cannot draw blood from the port.  You have a fever or chills.  You have redness, swelling, or pain around your port insertion site.  You have fluid or blood coming from your port insertion site.  Your port insertion site feels warm to the touch.  You have pus or a bad smell coming from the port insertion site. Get help right away if:  You have chest pain or shortness of breath.  You have bleeding from your port that you cannot control. Summary  Take care of the port as told by your health  care provider. Keep the manufacturer's information card with you at all times.  Change your dressing as told by your health care provider.  Contact a health care provider if you have a fever or chills or if you have redness, swelling, or pain around your port insertion site.  Keep all follow-up visits as told by your health care provider. This information is not intended to replace advice given to you by your health care provider. Make sure you discuss any questions you have with your health care provider. Document Released: 10/12/2012 Document Revised: 07/20/2017 Document Reviewed:  07/20/2017 Elsevier Patient Education  [place].

## 2018-08-15 NOTE — Anesthesia Procedure Notes (Signed)
Procedure Name: LMA Insertion Date/Time: 08/15/2018 1:28 PM Performed by: Maxwell Caul, CRNA Pre-anesthesia Checklist: Patient identified, Emergency Drugs available, Suction available, Patient being monitored and Timeout performed Patient Re-evaluated:Patient Re-evaluated prior to induction Oxygen Delivery Method: Circle system utilized Preoxygenation: Pre-oxygenation with 100% oxygen Induction Type: IV induction LMA: LMA inserted LMA Size: 4.0 Number of attempts: 1 Placement Confirmation: positive ETCO2 and breath sounds checked- equal and bilateral Tube secured with: Tape Dental Injury: Teeth and Oropharynx as per pre-operative assessment

## 2018-08-15 NOTE — Interval H&P Note (Signed)
History and Physical Interval Note:  08/15/2018 1:02 PM  Cody Suarez.  has presented today for surgery, with the diagnosis of METASTATIC COLON CANCER.  The various methods of treatment have been discussed with the patient and family. After consideration of risks, benefits and other options for treatment, the patient has consented to  Procedure(s): PORT A CATH PLACEMENT WITH POSSIBLE ULTRASOUND (N/A) as a surgical intervention.  The patient's history has been reviewed, patient examined, no change in status, stable for surgery.  I have reviewed the patient's chart and labs.  Questions were answered to the patient's satisfaction.     Pedro Earls

## 2018-08-15 NOTE — Anesthesia Postprocedure Evaluation (Signed)
Anesthesia Post Note  Patient: Sigurd Pugh.  Procedure(s) Performed: PORT A CATH PLACEMENT (Left Chest)     Patient location during evaluation: PACU Anesthesia Type: General Level of consciousness: awake and alert and oriented Pain management: pain level controlled Vital Signs Assessment: post-procedure vital signs reviewed and stable Respiratory status: spontaneous breathing, nonlabored ventilation and respiratory function stable Cardiovascular status: blood pressure returned to baseline and stable Postop Assessment: no apparent nausea or vomiting Anesthetic complications: no    Last Vitals:  Vitals:   08/15/18 1515 08/15/18 1530  BP: (!) 147/71 (!) 141/75  Pulse: 81 81  Resp: 11 16  Temp:    SpO2: 100% 100%    Last Pain:  Vitals:   08/15/18 1530  TempSrc:   PainSc: 0-No pain                 Jarae Nemmers A.

## 2018-08-15 NOTE — Op Note (Signed)
Cody Suarez.  1945-08-24 15 August 2018    PCP:  Midge Minium, MD   Surgeon: Kaylyn Lim, MD, FACS  Asst:  none  Anes:  General by LMA  Preop Dx: Need for chemotherapy Postop Dx: same  Procedure: Left subclavian portacath placement Location Surgery: [facility] 1 Complications: None noted  EBL:   minmal cc  Drains: none  Description of Procedure:  The patient was taken to OR 1 .  After anesthesia was administered and the patient was prepped  with [employer] and a timeout was performed.  Access to the left subclavian was achieved with first pass and venous blood flow.  The wire passed without difficulty.  Position of the wire checked with C arm and no pulsatile movement was noted.  The port pocket was fashioned and the catheter tunneled to the exit site of the wire.    The introducer sheath was passed over the wire and the obturator and wire and sheath were then checked with C arm and were withdrawn.  The flushed catheter was passed easily and appeared to be in the SVC ~ 20 cc from the skin.  It was connected to the port.  The port was secured with one prolene medially and then the port was flushed with concentrated heparin.  The incisions were closed with 4-0 vicryl and Dermabond.    The patient tolerated the procedure well and was taken to the PACU in stable condition.     Matt B. Hassell Done, [place], [facility], [place]

## 2018-08-16 ENCOUNTER — Encounter (HOSPITAL_COMMUNITY): Payer: Self-pay | Admitting: Surgery

## 2018-08-16 ENCOUNTER — Inpatient Hospital Stay: Payer: Medicare Other | Attending: Oncology

## 2018-08-16 ENCOUNTER — Other Ambulatory Visit: Payer: Self-pay | Admitting: *Deleted

## 2018-08-16 ENCOUNTER — Other Ambulatory Visit: Payer: Self-pay

## 2018-08-16 ENCOUNTER — Telehealth: Payer: Self-pay | Admitting: General Practice

## 2018-08-16 DIAGNOSIS — C7889 Secondary malignant neoplasm of other digestive organs: Secondary | ICD-10-CM

## 2018-08-16 DIAGNOSIS — Z5189 Encounter for other specified aftercare: Secondary | ICD-10-CM | POA: Insufficient documentation

## 2018-08-16 DIAGNOSIS — Z5111 Encounter for antineoplastic chemotherapy: Secondary | ICD-10-CM | POA: Insufficient documentation

## 2018-08-16 DIAGNOSIS — C184 Malignant neoplasm of transverse colon: Secondary | ICD-10-CM | POA: Insufficient documentation

## 2018-08-16 DIAGNOSIS — Z452 Encounter for adjustment and management of vascular access device: Secondary | ICD-10-CM | POA: Insufficient documentation

## 2018-08-16 DIAGNOSIS — D649 Anemia, unspecified: Secondary | ICD-10-CM | POA: Insufficient documentation

## 2018-08-16 MED ORDER — LIDOCAINE-PRILOCAINE 2.5-2.5 % EX CREA: TOPICAL_CREAM | CUTANEOUS | 1 refills | Status: AC

## 2018-08-16 MED ORDER — PROCHLORPERAZINE MALEATE 10 MG PO TABS: 10.0000 mg | ORAL_TABLET | Freq: Four times a day (QID) | ORAL | 1 refills | Status: AC | PRN

## 2018-08-16 NOTE — Telephone Encounter (Signed)
In looking at his med list, I don't see any reason why he couldn't restart meds

## 2018-08-16 NOTE — Telephone Encounter (Signed)
Pt would need to check with surgeon's office correct?   Copied from [place] [phone]. Topic: General - Other >> Aug 16, 2018  8:07 AM Celene Kras A wrote: Reason for CRM: Pt called and is requesting to know when he can start his regular medications back up after his surgery . Please advise.

## 2018-08-16 NOTE — Telephone Encounter (Signed)
Patient notified of PCP recommendations and is agreement and expresses an understanding.   [place] for [facility] to Discuss results / PCP recommendations / Schedule patient.

## 2018-08-17 ENCOUNTER — Inpatient Hospital Stay: Payer: Medicare Other

## 2018-08-17 ENCOUNTER — Other Ambulatory Visit: Payer: Self-pay

## 2018-08-17 ENCOUNTER — Inpatient Hospital Stay (HOSPITAL_BASED_OUTPATIENT_CLINIC_OR_DEPARTMENT_OTHER): Payer: Medicare Other | Admitting: Nurse Practitioner

## 2018-08-17 ENCOUNTER — Telehealth: Payer: Self-pay | Admitting: Oncology

## 2018-08-17 ENCOUNTER — Encounter: Payer: Self-pay | Admitting: Nurse Practitioner

## 2018-08-17 VITALS — BP 147/60 | HR 91 | Temp 98.0°F | Resp 18 | Ht 70.0 in | Wt 141.5 lb

## 2018-08-17 DIAGNOSIS — C7889 Secondary malignant neoplasm of other digestive organs: Secondary | ICD-10-CM

## 2018-08-17 DIAGNOSIS — C189 Malignant neoplasm of colon, unspecified: Secondary | ICD-10-CM

## 2018-08-17 DIAGNOSIS — Z95828 Presence of other vascular implants and grafts: Secondary | ICD-10-CM | POA: Insufficient documentation

## 2018-08-17 DIAGNOSIS — C799 Secondary malignant neoplasm of unspecified site: Secondary | ICD-10-CM

## 2018-08-17 DIAGNOSIS — Z5111 Encounter for antineoplastic chemotherapy: Secondary | ICD-10-CM | POA: Diagnosis not present

## 2018-08-17 DIAGNOSIS — C184 Malignant neoplasm of transverse colon: Secondary | ICD-10-CM | POA: Diagnosis not present

## 2018-08-17 DIAGNOSIS — Z452 Encounter for adjustment and management of vascular access device: Secondary | ICD-10-CM | POA: Diagnosis not present

## 2018-08-17 DIAGNOSIS — Z5189 Encounter for other specified aftercare: Secondary | ICD-10-CM | POA: Diagnosis not present

## 2018-08-17 DIAGNOSIS — D649 Anemia, unspecified: Secondary | ICD-10-CM | POA: Diagnosis not present

## 2018-08-17 LAB — CMP (CANCER CENTER ONLY)
ALT: 7 U/L (ref 0–44)
AST: 13 U/L — ABNORMAL LOW (ref 15–41)
Albumin: 3.1 g/dL — ABNORMAL LOW (ref 3.5–5.0)
Alkaline Phosphatase: 74 U/L (ref 38–126)
Anion gap: 11 (ref 5–15)
BUN: 12 mg/dL (ref 8–23)
CO2: 19 mmol/L — ABNORMAL LOW (ref 22–32)
Calcium: 8.7 mg/dL — ABNORMAL LOW (ref 8.9–10.3)
Chloride: 106 mmol/L (ref 98–111)
Creatinine: 0.85 mg/dL (ref 0.61–1.24)
GFR, Est AFR Am: >60 mL/min (ref >60)
GFR, Estimated: >60 mL/min (ref >60)
Glucose, Bld: 109 mg/dL — ABNORMAL HIGH (ref 70–99)
Potassium: 3.6 mmol/L (ref 3.5–5.1)
Sodium: 136 mmol/L (ref 135–145)
Total Bilirubin: 0.2 mg/dL — ABNORMAL LOW (ref 0.3–1.2)
Total Protein: 7.4 g/dL (ref 6.5–8.1)

## 2018-08-17 LAB — CBC WITH DIFFERENTIAL (CANCER CENTER ONLY)
Abs Immature Granulocytes: 0.1 10*3/uL — ABNORMAL HIGH (ref 0.00–0.07)
Basophils Absolute: 0.2 10*3/uL — ABNORMAL HIGH (ref 0.0–0.1)
Basophils Relative: 1 %
Eosinophils Absolute: 0.4 10*3/uL (ref 0.0–0.5)
Eosinophils Relative: 3 %
HCT: 25 % — ABNORMAL LOW (ref 39.0–52.0)
Hemoglobin: 7.9 g/dL — ABNORMAL LOW (ref 13.0–17.0)
Immature Granulocytes: 1 %
Lymphocytes Relative: 9 %
Lymphs Abs: 1.5 10*3/uL (ref 0.7–4.0)
MCH: 23.3 pg — ABNORMAL LOW (ref 26.0–34.0)
MCHC: 31.6 g/dL (ref 30.0–36.0)
MCV: 73.7 fL — ABNORMAL LOW (ref 80.0–100.0)
Monocytes Absolute: 1.8 10*3/uL — ABNORMAL HIGH (ref 0.1–1.0)
Monocytes Relative: 11 %
Neutro Abs: 12.5 10*3/uL — ABNORMAL HIGH (ref 1.7–7.7)
Neutrophils Relative %: 75 %
Platelet Count: 536 10*3/uL — ABNORMAL HIGH (ref 150–400)
RBC: 3.39 MIL/uL — ABNORMAL LOW (ref 4.22–5.81)
RDW: 19.7 % — ABNORMAL HIGH (ref 11.5–15.5)
WBC Count: 16.5 10*3/uL — ABNORMAL HIGH (ref 4.0–10.5)
nRBC: 0 % (ref 0.0–0.2)

## 2018-08-17 LAB — CEA (IN HOUSE-CHCC): CEA (CHCC-In House): 2.8 ng/mL (ref 0.00–5.00)

## 2018-08-17 MED ORDER — DEXAMETHASONE SODIUM PHOSPHATE 10 MG/ML IJ SOLN
10.0000 mg | Freq: Once | INTRAMUSCULAR | Status: AC
  Administered 2018-08-17: 10 mg via INTRAVENOUS

## 2018-08-17 MED ORDER — FLUOROURACIL CHEMO INJECTION 2.5 GM/50ML
400.0000 mg/m2 | Freq: Once | INTRAVENOUS | Status: AC
  Administered 2018-08-17: 700 mg via INTRAVENOUS
  Filled 2018-08-17: qty 14

## 2018-08-17 MED ORDER — DEXTROSE 5 % IV SOLN
Freq: Once | INTRAVENOUS | Status: DC
  Filled 2018-08-17: qty 250

## 2018-08-17 MED ORDER — LEUCOVORIN CALCIUM INJECTION 350 MG
400.0000 mg/m2 | Freq: Once | INTRAVENOUS | Status: AC
  Administered 2018-08-17: 712 mg via INTRAVENOUS
  Filled 2018-08-17: qty 35.6

## 2018-08-17 MED ORDER — SODIUM CHLORIDE 0.9% FLUSH
10.0000 mL | Freq: Once | INTRAVENOUS | Status: AC
  Administered 2018-08-17: 10 mL
  Filled 2018-08-17: qty 10

## 2018-08-17 MED ORDER — DEXTROSE 5 % IV SOLN
Freq: Once | INTRAVENOUS | Status: AC
  Administered 2018-08-17: 13:00:00 via INTRAVENOUS
  Filled 2018-08-17: qty 250

## 2018-08-17 MED ORDER — SODIUM CHLORIDE 0.9% FLUSH
10.0000 mL | INTRAVENOUS | Status: DC | PRN
  Filled 2018-08-17: qty 10

## 2018-08-17 MED ORDER — PALONOSETRON HCL INJECTION 0.25 MG/5ML
0.2500 mg | Freq: Once | INTRAVENOUS | Status: AC
  Administered 2018-08-17: 0.25 mg via INTRAVENOUS

## 2018-08-17 MED ORDER — SODIUM CHLORIDE 0.9 % IV SOLN
2400.0000 mg/m2 | INTRAVENOUS | Status: DC
  Administered 2018-08-17: 4250 mg via INTRAVENOUS
  Filled 2018-08-17: qty 85

## 2018-08-17 MED ORDER — DEXAMETHASONE SODIUM PHOSPHATE 10 MG/ML IJ SOLN
INTRAMUSCULAR | Status: AC
  Filled 2018-08-17: qty 1

## 2018-08-17 MED ORDER — PALONOSETRON HCL INJECTION 0.25 MG/5ML
INTRAVENOUS | Status: AC
  Filled 2018-08-17: qty 5

## 2018-08-17 MED ORDER — OXALIPLATIN CHEMO INJECTION 100 MG/20ML
85.0000 mg/m2 | Freq: Once | INTRAVENOUS | Status: AC
  Administered 2018-08-17: 150 mg via INTRAVENOUS
  Filled 2018-08-17: qty 20

## 2018-08-17 NOTE — Progress Notes (Addendum)
[facility] OFFICE PROGRESS NOTE   Diagnosis: Colon cancer  INTERVAL HISTORY:   Cody Suarez returns as scheduled.  No further speech problems.  He denies abdominal pain.  He has a poor appetite.  He denies bleeding.  He has dyspnea on exertion.  He has occasional mild nausea after eating.  Bowels moving regularly.  Objective:  Vital signs in last 24 hours:  Blood pressure (!) 147/60, pulse 91, temperature 98 F (36.7 C), temperature source Temporal, resp. rate 18, height 5' 10"  (1.778 m), weight 141 lb 8 oz (64.2 kg), SpO2 100 %.    HEENT: No thrush or ulcers. GI: Abdomen soft and nontender.  No hepatomegaly.  No mass. Vascular: No leg edema. Port-A-Cath without erythema.  Lab Results:  Lab Results  Component Value Date   WBC 16.5 (H) 08/17/2018   HGB 7.9 (L) 08/17/2018   HCT 25.0 (L) 08/17/2018   MCV 73.7 (L) 08/17/2018   PLT 536 (H) 08/17/2018   NEUTROABS 12.5 (H) 08/17/2018    Imaging:  Dg Chest Port 1 View  Result Date: 08/15/2018 CLINICAL DATA:  Port-A-Cath placement. EXAM: PORTABLE CHEST 1 VIEW COMPARISON:  One-view chest x-ray 12/24/2017 FINDINGS: A left subclavian Port-A-Cath is in place. The tip is in the distal SVC. Heart size is normal. Lung volumes are low. Minimal atelectasis or scarring is again noted at the left base. IMPRESSION: 1. Interval placement of left subclavian Port-A-Cath without radiographic evidence for complication. 2. Low lung volumes. 3. Left basilar airspace opacities likely reflect atelectasis or scarring. Electronically Signed   By: San Morelle M.D.   On: 08/15/2018 15:50   Dg C-arm 1-60 Min-no Report  Result Date: 08/15/2018 Fluoroscopy was utilized by the requesting physician.  No radiographic interpretation.    Medications: I have reviewed the patient's current medications.  Assessment/Plan: 1. 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 Negative for a BRAF mutation  Tumor invaded through the muscularis propria into pericolonic fatty tissue and involved the attached omentum.   Cycle 1 adjuvant Xeloda 05/28/2013.   Cycle 2 adjuvant Xeloda 06/18/2013.   Cycle 3 adjuvant Xeloda 07/09/2013.   Cycle 4 adjuvant Xeloda 07/30/2013.   Cycle 5 adjuvant Xeloda 08/20/2013.  Cycle 6 adjuvant Xeloda 09/11/2013.  cycle 7 adjuvant Xeloda 10/02/2013  Cycle 8 adjuvant Xeloda 10/23/2013  CTs of the chest, abdomen, and pelvis on 07/18/2014-negative for recurrent colon cancer   CTs abdomen/pelvis 09/06/2014 showed acute cholecystitis.  Colonoscopy 08/02/2015-chronic colitis, no malignancy  CTs 09/17/2015, new mass near the tail the pancreas, stable lung nodules  Biopsy mesenteric mass 10/04/2015 with metastatic adenocarcinoma consistent with colorectal primary  PET scan 10/10/2015 with soft tissue thickening within the peritoneal space of the left upper quadrant with SUV Max 7.9; no additional sites of peritoneal nodularity to suggest metastasis; hypermetabolic ill-defined nodule in the left lower lobe favored inflammatory.  Resection of the left abdominal mesenteric mass/tail the pancreas on 11/05/2015 with the pathology confirming metastatic colon cancer, positive "serosal" margin  CTs 07/29/2016-no evidence of metastatic disease, stable small lung nodules, decreased size of pancreatic resection bed fluid/mass  CTs 06/28/2017-decreased fluid and increased soft tissue at the area of the distal pancreatectomy with a few foci of gas  CT 09/29/2017-enlargement of left upper quadrant soft tissue mass between the stomach, spleen, and pancreas tail, stable nodules at the lung bases  CT biopsy of  the upper quadrant mass 10/19/2017-metastatic colon cancer, MSS, tumor mutation burden 1, no KRAS, BRAF, or NRAS mutation  CTs  12/24/2017-complex left upper quadrant process involving the stomach, spleen, tail the pancreas, and distal transverse colon.  CT 04/27/2018-stable to mild progression of the left upper quadrant mass, compression of the stomach with no obstruction, no other evidence of metastatic disease, resolution of small fluid collection lateral to the left upper quadrant mass  CT 07/21/2018-enlargement of the left upper quadrant mass with involvement of the posterior stomach and spleen. Possible new right upper lung nodule  Cycle 1 FOLFOX 08/17/2018 2. Ulcerative colitis-extensive chronic active ulcerative colitis was noted on the colon resection specimen 03/28/2013.  Followed by Dr.Magod 3. Hypertension.  4. History of microcytic anemia-likely iron deficiency, unable to tolerate oral iron. Improved. 5. Possible area of cecal wall thickening noted on abdominal CT 03/20/2013. 6. Family history of colon cancer (maternal grandfather died in his 17s with colon cancer). 7. Bilateral calf and low anterior leg pain. Bilateral lower extremity venous duplex negative for DVT 07/14/2013. Right ABI with moderate, borderline severe arterial insufficiency; left ABI suggestive of moderate arterial insufficiency. He was referred to vascular. 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 TEE showed thrombus in the descending aorta. He underwent right popliteal to peroneal artery bypass graft, thrombectomy anterior tibialis and attempted thrombectomy tibioperoneal trunk and posterior tibialis on 08/11/2013. Right calf pain 09/26/2013 with findings of occlusion of right popliteal to peroneal bypass status post thrombolysis. Now on anticoagulation with Xarelto.  recurrent pain and ulceration at the right foot, status post a popliteal to posterior tibial artery bypass using a cephalic vein graft on 31/54/0086  Xarelto discontinued due to high risk for bleeding and aspirin 81 mg daily initiated hospitalization  July 2020 8. Status post laparoscopic cholecystectomy 09/08/2014 9. Anorexia following the mesenteric mass/pancreas tail resection-improved 10. Abdominal abscess November 2017-pancreas "cysts "drainage catheter placed 11/26/2015; CT 02/04/2016 with no change to slight decreasein size of residual pancreatic tail fluid collection.Drainage catheter removed approximately early March 2018  CT 02/20/2016-new small left pleural effusion, increased size of the surgical bed fluid collection, indeterminate pulmonary nodules  CT 07/29/2016-decreased fluid/soft tissue density near the pancreas is no evidence of metastatic disease, stable subcentimeter lung nodules 11.Admission 12/24/2017 with an abdominal abscess, status post placement of a percutaneous drain and antibiotic therapy; CTs abdomen/pelvis 01/04/2018-near complete drainage of previously noted complex fluid collection in the left upper quadrant. 2 small residual collections remain. Residual soft tissue mass at the clips concerning for residual recurrent neoplasm. Peritoneal wall thickening also concerning for metastatic disease. Drain removed 01/04/2018  CT 01/18/2018-no change in soft tissue mass at the tail of pancreas with involvement of the gastric fundus and spleen, there is central low attenuation  Admission 01/20/2018 with fever and failure to thrive, no drainable fluid collection, placed on IV antibiotics, discharged to complete an outpatient course of IV ceftriaxone and Flagyl completed 02/20/2018  12.  Admission 07/31/2018 transient speech abnormality and microcytic anemia  13.  Port-A-Cath placement, Dr. Hassell Done, 08/15/2018  Disposition: Cody Suarez appears stable.  Plan to proceed with cycle 1 FOLFOX today as scheduled.  We again reviewed potential toxicities today.  He agrees to proceed.  We reviewed the CBC from today.  Counts are adequate to proceed with treatment.  He has progressive anemia which is likely due to GI blood loss.   He will continue oral iron.  He will return for a follow-up CBC in 1 week, transfusion support as needed.  He understands to contact the office with signs/symptoms suggestive of progressive anemia.  He will return for lab, follow-up, cycle 2 FOLFOX in 2 weeks.  He will contact the office in the interim as outlined above or with any other problems.  Patient seen with Dr. Benay Spice.  25 minutes were spent face-to-face at today's visit with the majority of that time involved in counseling/coordination of care.    Ned Card ANP/GNP-BC   08/17/2018  12:03 PM  This was a shared visit with Ned Card.  Cody Suarez will begin FOLFOX chemotherapy today.  Hemoglobin remains low.  I am concerned he has slow GI bleeding from the abdominal tumor.  He will continue iron.  We will arrange for transfusion support as needed.  We will consider referring him for an upper endoscopy if the anemia persists.  Julieanne Manson, MD

## 2018-08-17 NOTE — Telephone Encounter (Signed)
Scheduled per 08/12 los, patient received avs and calender.

## 2018-08-17 NOTE — Progress Notes (Signed)
Okay to treat with today's labs per Ned Card NP office visit note

## 2018-08-17 NOTE — Patient Instructions (Addendum)
[facility] Discharge Instructions for Patients Receiving Chemotherapy  Today you received the following chemotherapy agents: Oxaliplatin, Fluorouracil injection and pump   To help prevent nausea and vomiting after your treatment, we encourage you to take your nausea medication as directed.    If you develop nausea and vomiting that is not controlled by your nausea medication, call the clinic.   BELOW ARE SYMPTOMS THAT SHOULD BE REPORTED IMMEDIATELY:  *FEVER GREATER THAN 100.5 F  *CHILLS WITH OR WITHOUT FEVER  NAUSEA AND VOMITING THAT IS NOT CONTROLLED WITH YOUR NAUSEA MEDICATION  *UNUSUAL SHORTNESS OF BREATH  *UNUSUAL BRUISING OR BLEEDING  TENDERNESS IN MOUTH AND THROAT WITH OR WITHOUT PRESENCE OF ULCERS  *URINARY PROBLEMS  *BOWEL PROBLEMS  UNUSUAL RASH Items with * indicate a potential emergency and should be followed up as soon as possible.  Feel free to call the clinic should you have any questions or concerns. The clinic phone number is [phone].  Please show the [facility] at check-in to the Emergency Department and triage nurse.    Leucovorin injection What is this medicine? LEUCOVORIN (loo koe VOR in) is used to prevent or treat the harmful effects of some medicines. This medicine is used to treat anemia caused by a low amount of folic acid in the body. It is also used with 5-fluorouracil (5-FU) to treat colon cancer. This medicine may be used for other purposes; ask your health care provider or pharmacist if you have questions. What should I tell my health care provider before I take this medicine? They need to know if you have any of these conditions:  anemia from low levels of vitamin B-12 in the blood  an unusual or allergic reaction to leucovorin, folic acid, other medicines, foods, dyes, or preservatives  pregnant or trying to get pregnant  breast-feeding How should I use this medicine? This medicine is for injection into a  muscle or into a vein. It is given by a health care professional in a hospital or clinic setting. Talk to your pediatrician regarding the use of this medicine in children. Special care may be needed. Overdosage: If you think you have taken too much of this medicine contact a poison control center or emergency room at once. NOTE: This medicine is only for you. Do not share this medicine with others. What if I miss a dose? This does not apply. What may interact with this medicine?  capecitabine  fluorouracil  phenobarbital  phenytoin  primidone  trimethoprim-sulfamethoxazole This list may not describe all possible interactions. Give your health care provider a list of all the medicines, herbs, non-prescription drugs, or dietary supplements you use. Also tell them if you smoke, drink alcohol, or use illegal drugs. Some items may interact with your medicine. What should I watch for while using this medicine? Your condition will be monitored carefully while you are receiving this medicine. This medicine may increase the side effects of 5-fluorouracil, 5-FU. Tell your doctor or health care professional if you have diarrhea or mouth sores that do not get better or that get worse. What side effects may I notice from receiving this medicine? Side effects that you should report to your doctor or health care professional as soon as possible:  allergic reactions like skin rash, itching or hives, swelling of the face, lips, or tongue  breathing problems  fever, infection  mouth sores  unusual bleeding or bruising  unusually weak or tired Side effects that usually do not require medical  attention (report to your doctor or health care professional if they continue or are bothersome):  constipation or diarrhea  loss of appetite  nausea, vomiting This list may not describe all possible side effects. Call your doctor for medical advice about side effects. You may report side effects to FDA  at 1-800-FDA-1088. Where should I keep my medicine? This drug is given in a hospital or clinic and will not be stored at home. NOTE: This sheet is a summary. It may not cover all possible information. If you have questions about this medicine, talk to your doctor, pharmacist, or health care provider.  2020 Elsevier/Gold Standard (2007-06-28 16:50:29) Fluorouracil, 5-FU injection What is this medicine? FLUOROURACIL, 5-FU (flure oh YOOR a sil) is a chemotherapy drug. It slows the growth of cancer cells. This medicine is used to treat many types of cancer like breast cancer, colon or rectal cancer, pancreatic cancer, and stomach cancer. This medicine may be used for other purposes; ask your health care provider or pharmacist if you have questions. COMMON BRAND NAME(S): Adrucil What should I tell my health care provider before I take this medicine? They need to know if you have any of these conditions:  blood disorders  dihydropyrimidine dehydrogenase (DPD) deficiency  infection (especially a virus infection such as chickenpox, cold sores, or herpes)  kidney disease  liver disease  malnourished, poor nutrition  recent or ongoing radiation therapy  an unusual or allergic reaction to fluorouracil, other chemotherapy, other medicines, foods, dyes, or preservatives  pregnant or trying to get pregnant  breast-feeding How should I use this medicine? This drug is given as an infusion or injection into a vein. It is administered in a hospital or clinic by a specially trained health care professional. Talk to your pediatrician regarding the use of this medicine in children. Special care may be needed. Overdosage: If you think you have taken too much of this medicine contact a poison control center or emergency room at once. NOTE: This medicine is only for you. Do not share this medicine with others. What if I miss a dose? It is important not to miss your dose. Call your doctor or health  care professional if you are unable to keep an appointment. What may interact with this medicine?  allopurinol  cimetidine  dapsone  digoxin  hydroxyurea  leucovorin  levamisole  medicines for seizures like ethotoin, fosphenytoin, phenytoin  medicines to increase blood counts like filgrastim, pegfilgrastim, sargramostim  medicines that treat or prevent blood clots like warfarin, enoxaparin, and dalteparin  methotrexate  metronidazole  pyrimethamine  some other chemotherapy drugs like busulfan, cisplatin, estramustine, vinblastine  trimethoprim  trimetrexate  vaccines Talk to your doctor or health care professional before taking any of these medicines:  acetaminophen  aspirin  ibuprofen  ketoprofen  naproxen This list may not describe all possible interactions. Give your health care provider a list of all the medicines, herbs, non-prescription drugs, or dietary supplements you use. Also tell them if you smoke, drink alcohol, or use illegal drugs. Some items may interact with your medicine. What should I watch for while using this medicine? Visit your doctor for checks on your progress. This drug may make you feel generally unwell. This is not uncommon, as chemotherapy can affect healthy cells as well as cancer cells. Report any side effects. Continue your course of treatment even though you feel ill unless your doctor tells you to stop. In some cases, you may be given additional medicines to help with side  effects. Follow all directions for their use. Call your doctor or health care professional for advice if you get a fever, chills or sore throat, or other symptoms of a cold or flu. Do not treat yourself. This drug decreases your body's ability to fight infections. Try to avoid being around people who are sick. This medicine may increase your risk to bruise or bleed. Call your doctor or health care professional if you notice any unusual bleeding. Be careful  brushing and flossing your teeth or using a toothpick because you may get an infection or bleed more easily. If you have any dental work done, tell your dentist you are receiving this medicine. Avoid taking products that contain aspirin, acetaminophen, ibuprofen, naproxen, or ketoprofen unless instructed by your doctor. These medicines may hide a fever. Do not become pregnant while taking this medicine. Women should inform their doctor if they wish to become pregnant or think they might be pregnant. There is a potential for serious side effects to an unborn child. Talk to your health care professional or pharmacist for more information. Do not breast-feed an infant while taking this medicine. Men should inform their doctor if they wish to father a child. This medicine may lower sperm counts. Do not treat diarrhea with over the counter products. Contact your doctor if you have diarrhea that lasts more than 2 days or if it is severe and watery. This medicine can make you more sensitive to the sun. Keep out of the sun. If you cannot avoid being in the sun, wear protective clothing and use sunscreen. Do not use sun lamps or tanning beds/booths. What side effects may I notice from receiving this medicine? Side effects that you should report to your doctor or health care professional as soon as possible:  allergic reactions like skin rash, itching or hives, swelling of the face, lips, or tongue  low blood counts - this medicine may decrease the number of white blood cells, red blood cells and platelets. You may be at increased risk for infections and bleeding.  signs of infection - fever or chills, cough, sore throat, pain or difficulty passing urine  signs of decreased platelets or bleeding - bruising, pinpoint red spots on the skin, black, tarry stools, blood in the urine  signs of decreased red blood cells - unusually weak or tired, fainting spells, lightheadedness  breathing problems  changes in  vision  chest pain  mouth sores  nausea and vomiting  pain, swelling, redness at site where injected  pain, tingling, numbness in the hands or feet  redness, swelling, or sores on hands or feet  stomach pain  unusual bleeding Side effects that usually do not require medical attention (report to your doctor or health care professional if they continue or are bothersome):  changes in finger or toe nails  diarrhea  dry or itchy skin  hair loss  headache  loss of appetite  sensitivity of eyes to the light  stomach upset  unusually teary eyes This list may not describe all possible side effects. Call your doctor for medical advice about side effects. You may report side effects to FDA at 1-800-FDA-1088. Where should I keep my medicine? This drug is given in a hospital or clinic and will not be stored at home. NOTE: This sheet is a summary. It may not cover all possible information. If you have questions about this medicine, talk to your doctor, pharmacist, or health care provider.  2020 Elsevier/Gold Standard (2007-04-27 13:53:16) Oxaliplatin Injection  What is this medicine? OXALIPLATIN (ox AL i PLA tin) is a chemotherapy drug. It targets fast dividing cells, like cancer cells, and causes these cells to die. This medicine is used to treat cancers of the colon and rectum, and many other cancers. This medicine may be used for other purposes; ask your health care provider or pharmacist if you have questions. COMMON BRAND NAME(S): Eloxatin What should I tell my health care provider before I take this medicine? They need to know if you have any of these conditions:  kidney disease  an unusual or allergic reaction to oxaliplatin, other chemotherapy, other medicines, foods, dyes, or preservatives  pregnant or trying to get pregnant  breast-feeding How should I use this medicine? This drug is given as an infusion into a vein. It is administered in a hospital or clinic by  a specially trained health care professional. Talk to your pediatrician regarding the use of this medicine in children. Special care may be needed. Overdosage: If you think you have taken too much of this medicine contact a poison control center or emergency room at once. NOTE: This medicine is only for you. Do not share this medicine with others. What if I miss a dose? It is important not to miss a dose. Call your doctor or health care professional if you are unable to keep an appointment. What may interact with this medicine?  medicines to increase blood counts like filgrastim, pegfilgrastim, sargramostim  probenecid  some antibiotics like amikacin, gentamicin, neomycin, polymyxin B, streptomycin, tobramycin  zalcitabine Talk to your doctor or health care professional before taking any of these medicines:  acetaminophen  aspirin  ibuprofen  ketoprofen  naproxen This list may not describe all possible interactions. Give your health care provider a list of all the medicines, herbs, non-prescription drugs, or dietary supplements you use. Also tell them if you smoke, drink alcohol, or use illegal drugs. Some items may interact with your medicine. What should I watch for while using this medicine? Your condition will be monitored carefully while you are receiving this medicine. You will need important blood work done while you are taking this medicine. This medicine can make you more sensitive to cold. Do not drink cold drinks or use ice. Cover exposed skin before coming in contact with cold temperatures or cold objects. When out in cold weather wear warm clothing and cover your mouth and nose to warm the air that goes into your lungs. Tell your doctor if you get sensitive to the cold. This drug may make you feel generally unwell. This is not uncommon, as chemotherapy can affect healthy cells as well as cancer cells. Report any side effects. Continue your course of treatment even though  you feel ill unless your doctor tells you to stop. In some cases, you may be given additional medicines to help with side effects. Follow all directions for their use. Call your doctor or health care professional for advice if you get a fever, chills or sore throat, or other symptoms of a cold or flu. Do not treat yourself. This drug decreases your body's ability to fight infections. Try to avoid being around people who are sick. This medicine may increase your risk to bruise or bleed. Call your doctor or health care professional if you notice any unusual bleeding. Be careful brushing and flossing your teeth or using a toothpick because you may get an infection or bleed more easily. If you have any dental work done, tell your dentist you are  receiving this medicine. Avoid taking products that contain aspirin, acetaminophen, ibuprofen, naproxen, or ketoprofen unless instructed by your doctor. These medicines may hide a fever. Do not become pregnant while taking this medicine. Women should inform their doctor if they wish to become pregnant or think they might be pregnant. There is a potential for serious side effects to an unborn child. Talk to your health care professional or pharmacist for more information. Do not breast-feed an infant while taking this medicine. Call your doctor or health care professional if you get diarrhea. Do not treat yourself. What side effects may I notice from receiving this medicine? Side effects that you should report to your doctor or health care professional as soon as possible:  allergic reactions like skin rash, itching or hives, swelling of the face, lips, or tongue  low blood counts - This drug may decrease the number of white blood cells, red blood cells and platelets. You may be at increased risk for infections and bleeding.  signs of infection - fever or chills, cough, sore throat, pain or difficulty passing urine  signs of decreased platelets or bleeding -  bruising, pinpoint red spots on the skin, black, tarry stools, nosebleeds  signs of decreased red blood cells - unusually weak or tired, fainting spells, lightheadedness  breathing problems  chest pain, pressure  cough  diarrhea  jaw tightness  mouth sores  nausea and vomiting  pain, swelling, redness or irritation at the injection site  pain, tingling, numbness in the hands or feet  problems with balance, talking, walking  redness, blistering, peeling or loosening of the skin, including inside the mouth  trouble passing urine or change in the amount of urine Side effects that usually do not require medical attention (report to your doctor or health care professional if they continue or are bothersome):  changes in vision  constipation  hair loss  loss of appetite  metallic taste in the mouth or changes in taste  stomach pain This list may not describe all possible side effects. Call your doctor for medical advice about side effects. You may report side effects to FDA at 1-800-FDA-1088. Where should I keep my medicine? This drug is given in a hospital or clinic and will not be stored at home. NOTE: This sheet is a summary. It may not cover all possible information. If you have questions about this medicine, talk to your doctor, pharmacist, or health care provider.  2020 Elsevier/Gold Standard (2007-07-19 17:22:47) [facility] Discharge Instructions for Patients receiving Home Portable Chemo Pump    **The bag should finish at 46 hours, 96 hours or 7 days. For example, if your pump is scheduled for 46 hours and it was put on at 4pm, it should finish at 2 pm the day it is scheduled to come off regardless of your appointment time.    Estimated time to finish   _________________________ (Have your nurse fill in)     ** if the display on your pump reads "Low Volume" and it is beeping, take the batteries out of the pump and come to the cancer center for  it to be taken off.   **If the pump alarms go off prior to the pump reading "Low Volume" then call the [phone] and someone can assist you.  **If the plunger comes out and the bag fluid is running out, please use your chemo spill kit to clean up the spill. Do not use paper towels or other house hold products.  **  If you have problems or questions regarding your pump, please call either the [phone] or the cancer center Monday-Friday 8:00am-4:30pm at [phone] and we will assist you.  If you are unable to get assistance then go to [facility] Emergency Room, ask the staff to contact the IV team for assistance.

## 2018-08-18 ENCOUNTER — Telehealth: Payer: Self-pay | Admitting: *Deleted

## 2018-08-18 ENCOUNTER — Encounter: Payer: Self-pay | Admitting: Oncology

## 2018-08-18 NOTE — Telephone Encounter (Signed)
-----   Message from Lillia Corporal, RN sent at 08/17/2018  1:42 PM EDT ----- Regarding: Dr Benay Spice- first time chemo First time chemo FOLFOX

## 2018-08-18 NOTE — Telephone Encounter (Signed)
Called pt to see how he did with his chemotherapy yest.  He reports doing well with no complaints.  He knows how to reach [place] & knows to call if problems/concerns/questions.  He was very Pharmacologist.

## 2018-08-18 NOTE — Progress Notes (Signed)
Called pt to introduce myself as his Arboriculturist.  Pt has 2 insurances so copay assistance shouldn't be needed.  I informed him of the [place], went over what it covers and gave him the income requirement.  Pt is overqualified for the grant at this time.  I requested for the registration staff to give pt my card at his next visit for any questions or concerns he may have in the future.

## 2018-08-19 ENCOUNTER — Inpatient Hospital Stay: Payer: Medicare Other

## 2018-08-19 ENCOUNTER — Other Ambulatory Visit: Payer: Self-pay

## 2018-08-19 VITALS — BP 128/60 | HR 98 | Temp 98.7°F | Resp 18

## 2018-08-19 DIAGNOSIS — Z5111 Encounter for antineoplastic chemotherapy: Secondary | ICD-10-CM | POA: Diagnosis not present

## 2018-08-19 DIAGNOSIS — D649 Anemia, unspecified: Secondary | ICD-10-CM | POA: Diagnosis not present

## 2018-08-19 DIAGNOSIS — Z5189 Encounter for other specified aftercare: Secondary | ICD-10-CM | POA: Diagnosis not present

## 2018-08-19 DIAGNOSIS — Z452 Encounter for adjustment and management of vascular access device: Secondary | ICD-10-CM | POA: Diagnosis not present

## 2018-08-19 DIAGNOSIS — C7889 Secondary malignant neoplasm of other digestive organs: Secondary | ICD-10-CM | POA: Diagnosis not present

## 2018-08-19 DIAGNOSIS — C189 Malignant neoplasm of colon, unspecified: Secondary | ICD-10-CM

## 2018-08-19 DIAGNOSIS — C184 Malignant neoplasm of transverse colon: Secondary | ICD-10-CM | POA: Diagnosis not present

## 2018-08-19 MED ORDER — HEPARIN SOD (PORK) LOCK FLUSH 100 UNIT/ML IV SOLN
500.0000 [IU] | Freq: Once | INTRAVENOUS | Status: AC | PRN
  Administered 2018-08-19: 500 [IU]
  Filled 2018-08-19: qty 5

## 2018-08-19 MED ORDER — SODIUM CHLORIDE 0.9% FLUSH
10.0000 mL | INTRAVENOUS | Status: DC | PRN
  Administered 2018-08-19: 14:00:00 10 mL
  Filled 2018-08-19: qty 10

## 2018-08-23 ENCOUNTER — Ambulatory Visit (INDEPENDENT_AMBULATORY_CARE_PROVIDER_SITE_OTHER): Payer: Medicare Other | Admitting: Cardiovascular Disease

## 2018-08-23 ENCOUNTER — Other Ambulatory Visit: Payer: Self-pay

## 2018-08-23 VITALS — BP 116/54 | HR 103 | Temp 97.5°F | Ht 70.0 in | Wt 138.0 lb

## 2018-08-23 DIAGNOSIS — I739 Peripheral vascular disease, unspecified: Secondary | ICD-10-CM

## 2018-08-23 DIAGNOSIS — I872 Venous insufficiency (chronic) (peripheral): Secondary | ICD-10-CM

## 2018-08-23 DIAGNOSIS — E785 Hyperlipidemia, unspecified: Secondary | ICD-10-CM | POA: Diagnosis not present

## 2018-08-23 DIAGNOSIS — I10 Essential (primary) hypertension: Secondary | ICD-10-CM | POA: Diagnosis not present

## 2018-08-23 NOTE — Patient Instructions (Signed)
Medication Instructions:  Your physician recommends that you continue on your current medications as directed. Please refer to the Current Medication list given to you today.  If you need a refill on your cardiac medications before your next appointment, please call your pharmacy.   Lab work: None ordered If you have labs (blood work) drawn today and your tests are completely normal, you will receive your results only by: [place] (if you have MyChart) OR A paper copy in the mail If you have any lab test that is abnormal or we need to change your treatment, we will call you to review the results.  Testing/Procedures: None ordered  Follow-Up: At [facility], you and your health needs are our priority.  As part of our continuing mission to provide you with exceptional heart care, we have created designated Provider Care Teams.  These Care Teams include your primary Cardiologist (physician) and Advanced Practice Providers (APPs -  Physician Assistants and Nurse Practitioners) who all work together to provide you with the care you need, when you need it. You will need a follow up appointment in 3 months.  Please call our office 2 months in advance to schedule this appointment.  You may see Kathlyn [place], MD or one of the following Advanced Practice Providers on your designated Care Team:   Kerin Ransom, PA-C [address] PA-C [place], [place]

## 2018-08-23 NOTE — Progress Notes (Signed)
Cardiology Office Note   Date:  08/26/2018   ID:  Cody Tinnel., DOB [birth year], MRN [id number]  PCP:  Midge Minium, MD  Cardiologist:   Kathlyn Sacramento, MD   No chief complaint on file.     History of Present Illness: Cody Eckstein. is a 73 y.o. male who presents a follow-up visit regarding  peripheral arterial disease and hypertension.  He was diagnosed with colon cancer in 2015 and underwent partial colectomy followed by chemotherapy. He was seen in July 2015 for bilateral calf discomfort. Angiography was done in August 2015 which showed: 1. No significant aortoiliac disease. 2. 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 One-vessel runoff below the knee bilaterally via the peroneal artery which was occluded proximally but did reconstitute via collaterals. TEE was performed to evaluate source of embolism. It showed normal LV systolic function and wall motion. There was a mobile thrombus in the proximal decending aorta on top of an ulcerated plaque. CTA showed an ulcerated plaque in the proximal descending aorta with thrombus. He was evaluated by Dr. Trula Slade. Anticoagulation was recommended and he has been on Xarelto since then. He underwent right SFA to posterior tibial bypass by Dr. Kellie Simmering multiple times with poor outcome due to poor runoff. He was hospitalized recently with speech abnormality with poor oral intake and weight loss.  CT scan of the abdomen showed findings suspicious for progressive tumor infiltration.  He was noted to be severely anemic with a hemoglobin of 6.2.  He was transfused.  Echo showed normal ejection fraction.  No obvious source of bleeding was identified.  Xarelto was discontinued and he is here to discuss whether Xarelto should be resumed or not.  Unfortunately, he has lost significant amount of weight and currently is getting chemotherapy for recurrent colon cancer. No chest  pain or shortness of breath.  No significant leg claudication.   Past Medical History:  Diagnosis Date  . Anemia of chronic disease 2015   "related to cancer tx" (08/09/2013)  . Blood clot in vein 2015  . Colon cancer ([place]) 03/2013  . History of kidney stones    x 2passed them both  . Hypertension   . Peripheral vascular disease ([place])   . Ulcerative colitis ([place])    history    Past Surgical History:  Procedure Laterality Date  . ABDOMINAL AORTAGRAM N/A 08/09/2013   Procedure: ABDOMINAL Maxcine [place];  Surgeon: [name], MD;  Location: [facility];  Service: Cardiovascular;  Laterality: N/A;  . CHOLECYSTECTOMY N/A 09/08/2014   Procedure: LAPAROSCOPIC CHOLECYSTECTOMY WITH INTRAOPERATIVE CHOLANGIOGRAM;  Surgeon: Armandina Gemma, MD;  Location: [facility];  Service: General;  Laterality: N/A;  . COLON SURGERY  02/2013  . COLON SURGERY  10/2015  . FEMORAL-POPLITEAL BYPASS GRAFT Right 08/11/2013   Procedure:   RIGHT - POPLITEAL TO PERONEAL ARTERY BYPASS GRAFT  WITH NONREVERSED SAPHENOUS VEIN GRAFT,tHROMBECTOMY ANTERIOR TIBIALIS,ATTEMPTED THROMBECTOMY TIBIO-PERONEAL TRUNK AND POSTERIOR TIBIALIS, INTRAOPERATIVE ARTERIOGRAM.;  Surgeon: Mal Misty, MD;  Location: [facility];  Service: Vascular;  Laterality: Right;  . FEMORAL-TIBIAL BYPASS GRAFT Right 11/17/2013   Procedure: BYPASS GRAFT RIGHT ABOVE KNEE POPLITEAL TO POSTERIOR TIBIAL ARTERY USING RIGHT NON-REVERSED CEPHALIC VEIN;  Surgeon: Mal Misty, MD;  Location: [facility];  Service: Vascular;  Laterality: Right;  . HERNIA REPAIR  ~ 1995; 03/2013   UHR  . INTRAOPERATIVE ARTERIOGRAM Right 11/17/2013   Procedure: INTRA OPERATIVE ARTERIOGRAM;  Surgeon:  Mal Misty, MD;  Location: [facility] OR;  Service: Vascular;  Laterality: Right;  . IR GENERIC HISTORICAL  12/11/2015   IR RADIOLOGIST EVAL & MGMT 12/11/2015 Arne Cleveland, MD [facility]  . IR GENERIC HISTORICAL  12/24/2015   IR RADIOLOGIST EVAL & MGMT 12/24/2015 Jacqulynn Cadet, MD [facility]  .  IR GENERIC HISTORICAL  01/21/2016   IR RADIOLOGIST EVAL & MGMT 01/21/2016 [name], MD [facility]  . IR GENERIC HISTORICAL  02/04/2016   IR RADIOLOGIST EVAL & MGMT 02/04/2016 Sandi Mariscal, MD [facility]  . IR GENERIC HISTORICAL  02/12/2016   IR CATHETER TUBE CHANGE 02/12/2016 Corrie Mckusick, DO [facility]  . IR GENERIC HISTORICAL  02/20/2016   IR RADIOLOGIST EVAL & MGMT 02/20/2016 Markus Daft, MD [facility]  . IR SINUS/FIST TUBE CHK-NON GI  01/04/2018  . LAPAROSCOPIC RIGHT HEMI COLECTOMY N/A 03/28/2013   Procedure: LAPAROSCOPIC ASSISTED HEMI COLECTOMY;  Surgeon: Pedro Earls, MD;  Location: [facility];  Service: General;  Laterality: N/A;  . LAPAROSCOPY N/A 11/05/2015   Procedure: LAPAROSCOPY DIAGNOSTIC;  Surgeon: [name], MD;  Location: [facility];  Service: General;  Laterality: N/A;  . LAPAROTOMY N/A 11/05/2015   Procedure: EXPLORATORY LAPAROTOMY, resection of mass at tail of pancreas;  Surgeon: [name], MD;  Location: [facility];  Service: General;  Laterality: N/A;  . LOWER EXTREMITY ANGIOGRAM Bilateral 08/09/2013  . PORTACATH PLACEMENT Left 08/15/2018   Procedure: PORT A CATH PLACEMENT;  Surgeon: [name], MD;  Location: [facility];  Service: General;  Laterality: Left;  . TEE WITHOUT CARDIOVERSION N/A 08/10/2013   Procedure: TRANSESOPHAGEAL ECHOCARDIOGRAM (TEE);  Surgeon: Candee Furbish, MD;  Location: [facility] ENDOSCOPY;  Service: Cardiovascular;  Laterality: N/A;  . TONSILLECTOMY  ~ 1950  . [place]; 03/2013  . VASECTOMY    . VEIN HARVEST Right 11/17/2013   Procedure: HARVEST OF RIGHT UPPER EXTREMITY CEPHALIC VEIN;  Surgeon: Mal Misty, MD;  Location: [facility];  Service: Vascular;  Laterality: Right;     Current Outpatient Medications  Medication Sig Dispense Refill  . acetaminophen (TYLENOL) 500 MG tablet Take 1,000 mg by mouth daily as needed for mild pain or headache.     Marland Kitchen aspirin EC 81 MG tablet Take 1 tablet (81 mg total) by mouth daily. 90  tablet 3  . calcium carbonate (TUMS EX) 750 MG chewable tablet Chew 1-2 tablets by mouth daily as needed for heartburn.    . Coenzyme Q10 (COQ10 PO) Take 1 capsule by mouth daily.    . ferrous sulfate 325 (65 FE) MG tablet Take 1 tablet (325 mg total) by mouth 3 (three) times daily with meals. 90 tablet 2  . Homeopathic Products (THERAWORX RELIEF EX) Apply 1 application topically daily as needed (calf pain).    Marland Kitchen HYDROcodone-acetaminophen (NORCO/VICODIN) 5-325 MG tablet Take 1 tablet by mouth every 6 (six) hours as needed for moderate pain.     Marland Kitchen HYDROcodone-acetaminophen (NORCO/VICODIN) 5-325 MG tablet Take 1 tablet by mouth every 6 (six) hours as needed for moderate pain. 15 tablet 0  . lidocaine-prilocaine (EMLA) cream Apply 1 tsp to port site 1 hour prior to stick and cover with plastic wrap 30 g 1  . Liniments (BLUE-EMU SUPER STRENGTH EX) Apply 1 application topically daily as needed (knee pain).    Marland Kitchen losartan (COZAAR) 100 MG tablet Take 0.5 tablets (50 mg total) by mouth daily. 45 tablet 2  . magic mouthwash SOLN Take  5 mLs by mouth 4 (four) times daily as needed for mouth pain. Swish and spit 240 mL 1  . megestrol (MEGACE) 40 MG tablet Take 4 tablets (160 mg total) by mouth daily. (Patient taking differently: Take 40 mg by mouth daily. ) 30 tablet 11  . Multiple Vitamin (MULTIVITAMIN WITH MINERALS) TABS tablet Take 1 tablet by mouth daily.    . ondansetron (ZOFRAN) 4 MG tablet TAKE 1 TABLET BY MOUTH EVERY 8 HOURS AS NEEDED FOR NAUSEA FOR VOMITING (Patient taking differently: Take 4 mg by mouth every 8 (eight) hours as needed for nausea or vomiting. ) 20 tablet 3  . prochlorperazine (COMPAZINE) 10 MG tablet Take 1 tablet (10 mg total) by mouth every 6 (six) hours as needed for nausea. 60 tablet 1  . spironolactone (ALDACTONE) 25 MG tablet TAKE 1 TABLET BY MOUTH ONCE DAILY (Patient taking differently: Take 25 mg by mouth daily. ) 30 tablet 5  . sulfaSALAzine (AZULFIDINE) 500 MG EC tablet Take  1,500 mg by mouth daily.   3  . zolpidem (AMBIEN) 5 MG tablet Take 1 tablet (5 mg total) by mouth at bedtime as needed for sleep. 30 tablet 2   No current facility-administered medications for this visit.     Allergies:   Amlodipine and Gabapentin    Social History:  The patient  reports that he has never smoked. He has never used smokeless tobacco. He reports that he does not drink alcohol or use drugs.   Family History:  The patient's family history includes Emphysema in his father; Heart disease in his mother.    ROS:  Please see the history of present illness.   Otherwise, review of systems are positive for none.   All other systems are reviewed and negative.    PHYSICAL EXAM: VS:  BP (!) 116/54 (BP Location: Left Arm, Patient Position: Sitting, Cuff Size: Normal)   Pulse (!) 103   Temp (!) 97.5 F (36.4 C)   Ht 5' 10"  (1.778 m)   Wt 138 lb (62.6 kg)   BMI 19.80 kg/m  , BMI Body mass index is 19.8 kg/m. GEN: Underweight. HEENT: normal  Neck: no JVD, carotid bruits, or masses Cardiac: RRR; no murmurs, rubs, or gallops, trace bilateral leg edema to the knees Respiratory:  clear to auscultation bilaterally, normal work of breathing GI: soft, nontender, nondistended, + BS MS: no deformity or atrophy  Skin: warm and dry, no rash Neuro:  Strength and sensation are intact Psych: Emotional and tearful.   EKG:  EKG is ordered today. EKG showed normal sinus bradycardia with no significant ST or T wave changes.   Recent Labs: 11/12/2017: TSH 1.82 08/17/2018: ALT 7; BUN 12; Creatinine 0.85; Potassium 3.6; Sodium 136 08/24/2018: Hemoglobin 7.6; Platelet Count 305    Lipid Panel    Component Value Date/Time   CHOL 93 08/01/2018 0445   CHOL 132 11/05/2016 0855   TRIG 101 08/01/2018 0445   HDL 18 (L) 08/01/2018 0445   HDL 42 11/05/2016 0855   CHOLHDL 5.2 08/01/2018 0445   VLDL 20 08/01/2018 0445   LDLCALC 55 08/01/2018 0445   LDLCALC 59 11/05/2016 0855   LDLDIRECT 91.0  11/12/2017 1353      Wt Readings from Last 3 Encounters:  08/23/18 138 lb (62.6 kg)  08/17/18 141 lb 8 oz (64.2 kg)  08/15/18 153 lb 1 oz (69.4 kg)       ASSESSMENT AND PLAN:   1. Peripheral arterial disease with occluded tibial vessels  due to embolization from ulcerative plaque in the aorta. He has no claudication at the present time. Continue medical therapy.  I agree with holding Xarelto for now given recent severe anemia that required transfusion and also the fact that he is getting chemotherapy.  I think the risks of anticoagulation outweigh the benefit at the present time. I am going to wait until he is done with chemotherapy and see how he responds and then my plan is to repeat his CTA of the aorta to see if there is any evidence of ulcerated aortic plaque that requires anticoagulation.  2.  Chronic venous insufficiency: Leg swelling and edema improved with conservative measures.  He is no longer on torsemide.  3. Essential hypertension: Blood pressure is well controlled on  losartan and spironolactone.  We might have to decrease some of his antihypertensive medications if he continues to lose weight.  4.  Metastatic colon cancer: Currently getting chemotherapy.   Disposition:   FU with me in 3 months  Signed,  Kathlyn Sacramento, MD  08/26/2018 11:39 AM    [place]

## 2018-08-24 ENCOUNTER — Other Ambulatory Visit: Payer: Self-pay

## 2018-08-24 ENCOUNTER — Telehealth: Payer: Self-pay | Admitting: *Deleted

## 2018-08-24 ENCOUNTER — Telehealth: Payer: Self-pay | Admitting: Oncology

## 2018-08-24 ENCOUNTER — Inpatient Hospital Stay: Payer: Medicare Other

## 2018-08-24 ENCOUNTER — Other Ambulatory Visit: Payer: Self-pay | Admitting: *Deleted

## 2018-08-24 DIAGNOSIS — C7889 Secondary malignant neoplasm of other digestive organs: Secondary | ICD-10-CM | POA: Diagnosis not present

## 2018-08-24 DIAGNOSIS — C184 Malignant neoplasm of transverse colon: Secondary | ICD-10-CM | POA: Diagnosis not present

## 2018-08-24 DIAGNOSIS — C799 Secondary malignant neoplasm of unspecified site: Secondary | ICD-10-CM

## 2018-08-24 DIAGNOSIS — Z5189 Encounter for other specified aftercare: Secondary | ICD-10-CM | POA: Diagnosis not present

## 2018-08-24 DIAGNOSIS — D649 Anemia, unspecified: Secondary | ICD-10-CM

## 2018-08-24 DIAGNOSIS — Z452 Encounter for adjustment and management of vascular access device: Secondary | ICD-10-CM | POA: Diagnosis not present

## 2018-08-24 DIAGNOSIS — Z95828 Presence of other vascular implants and grafts: Secondary | ICD-10-CM

## 2018-08-24 DIAGNOSIS — Z5111 Encounter for antineoplastic chemotherapy: Secondary | ICD-10-CM | POA: Diagnosis not present

## 2018-08-24 LAB — CBC WITH DIFFERENTIAL (CANCER CENTER ONLY)
Abs Immature Granulocytes: 0.11 10*3/uL — ABNORMAL HIGH (ref 0.00–0.07)
Basophils Absolute: 0.1 10*3/uL (ref 0.0–0.1)
Basophils Relative: 1 %
Eosinophils Absolute: 0.4 10*3/uL (ref 0.0–0.5)
Eosinophils Relative: 3 %
HCT: 23.9 % — ABNORMAL LOW (ref 39.0–52.0)
Hemoglobin: 7.6 g/dL — ABNORMAL LOW (ref 13.0–17.0)
Immature Granulocytes: 1 %
Lymphocytes Relative: 10 %
Lymphs Abs: 1.2 10*3/uL (ref 0.7–4.0)
MCH: 23.3 pg — ABNORMAL LOW (ref 26.0–34.0)
MCHC: 31.8 g/dL (ref 30.0–36.0)
MCV: 73.3 fL — ABNORMAL LOW (ref 80.0–100.0)
Monocytes Absolute: 0.5 10*3/uL (ref 0.1–1.0)
Monocytes Relative: 4 %
Neutro Abs: 9.9 10*3/uL — ABNORMAL HIGH (ref 1.7–7.7)
Neutrophils Relative %: 81 %
Platelet Count: 305 10*3/uL (ref 150–400)
RBC: 3.26 MIL/uL — ABNORMAL LOW (ref 4.22–5.81)
RDW: 20.4 % — ABNORMAL HIGH (ref 11.5–15.5)
WBC Count: 12.1 10*3/uL — ABNORMAL HIGH (ref 4.0–10.5)
nRBC: 0 % (ref 0.0–0.2)

## 2018-08-24 LAB — SAMPLE TO BLOOD BANK

## 2018-08-24 MED ORDER — HEPARIN SOD (PORK) LOCK FLUSH 100 UNIT/ML IV SOLN
500.0000 [IU] | Freq: Once | INTRAVENOUS | Status: AC
  Administered 2018-08-24: 12:00:00 500 [IU]
  Filled 2018-08-24: qty 5

## 2018-08-24 MED ORDER — SODIUM CHLORIDE 0.9% FLUSH
10.0000 mL | Freq: Once | INTRAVENOUS | Status: AC
  Administered 2018-08-24: 10 mL
  Filled 2018-08-24: qty 10

## 2018-08-24 MED ORDER — MAGIC MOUTHWASH: 5.0000 mL | Freq: Four times a day (QID) | ORAL | 1 refills | Status: DC | PRN

## 2018-08-24 NOTE — Telephone Encounter (Signed)
Scheduled appt per 8/19 sch message - pt aware of appt date and time

## 2018-08-24 NOTE — Telephone Encounter (Signed)
Notified patient that his Hgb is lower at 7.6 today. He says he feels fine as long as he is sitting. Agrees that he would feel better with a transfusion. Will set up for 08/26/18. Will need to repeat T & S today since he cut of his blood bank bracelet after leaving cancer center today. Orders placed.

## 2018-08-24 NOTE — Progress Notes (Signed)
Pt here today for labs and port flush pt HGB is 7.6 today Pt says he feels fine. No shortness of breath or complications at this time

## 2018-08-26 ENCOUNTER — Inpatient Hospital Stay: Payer: Medicare Other

## 2018-08-26 ENCOUNTER — Other Ambulatory Visit: Payer: Self-pay

## 2018-08-26 DIAGNOSIS — C184 Malignant neoplasm of transverse colon: Secondary | ICD-10-CM | POA: Diagnosis not present

## 2018-08-26 DIAGNOSIS — Z452 Encounter for adjustment and management of vascular access device: Secondary | ICD-10-CM | POA: Diagnosis not present

## 2018-08-26 DIAGNOSIS — C799 Secondary malignant neoplasm of unspecified site: Secondary | ICD-10-CM

## 2018-08-26 DIAGNOSIS — D649 Anemia, unspecified: Secondary | ICD-10-CM

## 2018-08-26 DIAGNOSIS — C7889 Secondary malignant neoplasm of other digestive organs: Secondary | ICD-10-CM | POA: Diagnosis not present

## 2018-08-26 DIAGNOSIS — Z5189 Encounter for other specified aftercare: Secondary | ICD-10-CM | POA: Diagnosis not present

## 2018-08-26 DIAGNOSIS — Z5111 Encounter for antineoplastic chemotherapy: Secondary | ICD-10-CM | POA: Diagnosis not present

## 2018-08-26 DIAGNOSIS — Z95828 Presence of other vascular implants and grafts: Secondary | ICD-10-CM

## 2018-08-26 LAB — TYPE AND SCREEN
ABO/RH(D): B NEG
Antibody Screen: NEGATIVE
Unit division: 0
Unit division: 0

## 2018-08-26 LAB — BPAM RBC
Blood Product Expiration Date: 202009092359
Blood Product Expiration Date: 202009102359
ISSUE DATE / TIME: 202008211327
ISSUE DATE / TIME: 202008211327
Unit Type and Rh: 1700
Unit Type and Rh: 1700

## 2018-08-26 LAB — PREPARE RBC (CROSSMATCH)

## 2018-08-26 LAB — ABO/RH: ABO/RH(D): B NEG

## 2018-08-26 MED ORDER — HEPARIN SOD (PORK) LOCK FLUSH 100 UNIT/ML IV SOLN
500.0000 [IU] | Freq: Every day | INTRAVENOUS | Status: AC | PRN
  Administered 2018-08-26: 500 [IU]
  Filled 2018-08-26: qty 5

## 2018-08-26 MED ORDER — SODIUM CHLORIDE 0.9% FLUSH
10.0000 mL | Freq: Once | INTRAVENOUS | Status: AC
  Administered 2018-08-26: 10 mL
  Filled 2018-08-26: qty 10

## 2018-08-26 MED ORDER — SODIUM CHLORIDE 0.9% IV SOLUTION
250.0000 mL | Freq: Once | INTRAVENOUS | Status: AC
  Administered 2018-08-26: 250 mL via INTRAVENOUS
  Filled 2018-08-26: qty 250

## 2018-08-26 MED ORDER — SODIUM CHLORIDE 0.9% FLUSH
10.0000 mL | INTRAVENOUS | Status: AC | PRN
  Administered 2018-08-26: 10 mL
  Filled 2018-08-26: qty 10

## 2018-08-26 NOTE — Patient Instructions (Signed)

## 2018-08-27 LAB — TYPE AND SCREEN
ABO/RH(D): B NEG
Antibody Screen: NEGATIVE
Unit division: 0

## 2018-08-27 LAB — BPAM RBC
Blood Product Expiration Date: 202009092359
ISSUE DATE / TIME: 202008211431
Unit Type and Rh: 1700

## 2018-08-28 ENCOUNTER — Other Ambulatory Visit: Payer: Self-pay | Admitting: Oncology

## 2018-08-29 ENCOUNTER — Ambulatory Visit: Payer: Medicare Other | Admitting: Oncology

## 2018-08-31 ENCOUNTER — Inpatient Hospital Stay: Payer: Medicare Other

## 2018-08-31 ENCOUNTER — Inpatient Hospital Stay (HOSPITAL_BASED_OUTPATIENT_CLINIC_OR_DEPARTMENT_OTHER): Payer: Medicare Other | Admitting: Nurse Practitioner

## 2018-08-31 ENCOUNTER — Other Ambulatory Visit: Payer: Self-pay

## 2018-08-31 ENCOUNTER — Encounter: Payer: Self-pay | Admitting: Nurse Practitioner

## 2018-08-31 VITALS — BP 149/73 | HR 100 | Temp 98.7°F | Resp 17 | Ht 70.0 in | Wt 138.8 lb

## 2018-08-31 DIAGNOSIS — Z452 Encounter for adjustment and management of vascular access device: Secondary | ICD-10-CM | POA: Diagnosis not present

## 2018-08-31 DIAGNOSIS — C189 Malignant neoplasm of colon, unspecified: Secondary | ICD-10-CM

## 2018-08-31 DIAGNOSIS — C799 Secondary malignant neoplasm of unspecified site: Secondary | ICD-10-CM

## 2018-08-31 DIAGNOSIS — C7889 Secondary malignant neoplasm of other digestive organs: Secondary | ICD-10-CM

## 2018-08-31 DIAGNOSIS — Z5189 Encounter for other specified aftercare: Secondary | ICD-10-CM | POA: Diagnosis not present

## 2018-08-31 DIAGNOSIS — Z95828 Presence of other vascular implants and grafts: Secondary | ICD-10-CM

## 2018-08-31 DIAGNOSIS — Z5111 Encounter for antineoplastic chemotherapy: Secondary | ICD-10-CM | POA: Diagnosis not present

## 2018-08-31 DIAGNOSIS — C184 Malignant neoplasm of transverse colon: Secondary | ICD-10-CM | POA: Diagnosis not present

## 2018-08-31 DIAGNOSIS — D649 Anemia, unspecified: Secondary | ICD-10-CM | POA: Diagnosis not present

## 2018-08-31 LAB — CMP (CANCER CENTER ONLY)
ALT: 7 U/L (ref 0–44)
AST: 15 U/L (ref 15–41)
Albumin: 3.3 g/dL — ABNORMAL LOW (ref 3.5–5.0)
Alkaline Phosphatase: 80 U/L (ref 38–126)
Anion gap: 10 (ref 5–15)
BUN: 11 mg/dL (ref 8–23)
CO2: 20 mmol/L — ABNORMAL LOW (ref 22–32)
Calcium: 9 mg/dL (ref 8.9–10.3)
Chloride: 107 mmol/L (ref 98–111)
Creatinine: 0.91 mg/dL (ref 0.61–1.24)
GFR, Est AFR Am: >60 mL/min (ref >60)
GFR, Estimated: >60 mL/min (ref >60)
Glucose, Bld: 99 mg/dL (ref 70–99)
Potassium: 3.9 mmol/L (ref 3.5–5.1)
Sodium: 137 mmol/L (ref 135–145)
Total Bilirubin: 0.3 mg/dL (ref 0.3–1.2)
Total Protein: 7.4 g/dL (ref 6.5–8.1)

## 2018-08-31 LAB — CBC WITH DIFFERENTIAL (CANCER CENTER ONLY)
Abs Immature Granulocytes: 0.01 10*3/uL (ref 0.00–0.07)
Basophils Absolute: 0.1 10*3/uL (ref 0.0–0.1)
Basophils Relative: 3 %
Eosinophils Absolute: 0.2 10*3/uL (ref 0.0–0.5)
Eosinophils Relative: 5 %
HCT: 31.8 % — ABNORMAL LOW (ref 39.0–52.0)
Hemoglobin: 9.9 g/dL — ABNORMAL LOW (ref 13.0–17.0)
Immature Granulocytes: 0 %
Lymphocytes Relative: 35 %
Lymphs Abs: 1.2 10*3/uL (ref 0.7–4.0)
MCH: 24.3 pg — ABNORMAL LOW (ref 26.0–34.0)
MCHC: 31.1 g/dL (ref 30.0–36.0)
MCV: 78.1 fL — ABNORMAL LOW (ref 80.0–100.0)
Monocytes Absolute: 0.7 10*3/uL (ref 0.1–1.0)
Monocytes Relative: 22 %
Neutro Abs: 1.2 10*3/uL — ABNORMAL LOW (ref 1.7–7.7)
Neutrophils Relative %: 35 %
Platelet Count: 302 10*3/uL (ref 150–400)
RBC: 4.07 MIL/uL — ABNORMAL LOW (ref 4.22–5.81)
RDW: 24.3 % — ABNORMAL HIGH (ref 11.5–15.5)
WBC Count: 3.4 10*3/uL — ABNORMAL LOW (ref 4.0–10.5)
nRBC: 0 % (ref 0.0–0.2)

## 2018-08-31 MED ORDER — DEXTROSE 5 % IV SOLN
Freq: Once | INTRAVENOUS | Status: AC
  Administered 2018-08-31: 14:00:00 via INTRAVENOUS
  Filled 2018-08-31: qty 250

## 2018-08-31 MED ORDER — DEXAMETHASONE SODIUM PHOSPHATE 10 MG/ML IJ SOLN
INTRAMUSCULAR | Status: AC
  Filled 2018-08-31: qty 1

## 2018-08-31 MED ORDER — PALONOSETRON HCL INJECTION 0.25 MG/5ML
INTRAVENOUS | Status: AC
  Filled 2018-08-31: qty 5

## 2018-08-31 MED ORDER — SODIUM CHLORIDE 0.9 % IV SOLN
2400.0000 mg/m2 | INTRAVENOUS | Status: DC
  Administered 2018-08-31: 4250 mg via INTRAVENOUS
  Filled 2018-08-31: qty 85

## 2018-08-31 MED ORDER — FLUOROURACIL CHEMO INJECTION 2.5 GM/50ML
400.0000 mg/m2 | Freq: Once | INTRAVENOUS | Status: AC
  Administered 2018-08-31: 700 mg via INTRAVENOUS
  Filled 2018-08-31: qty 14

## 2018-08-31 MED ORDER — OXALIPLATIN CHEMO INJECTION 100 MG/20ML
85.0000 mg/m2 | Freq: Once | INTRAVENOUS | Status: AC
  Administered 2018-08-31: 150 mg via INTRAVENOUS
  Filled 2018-08-31: qty 10

## 2018-08-31 MED ORDER — PALONOSETRON HCL INJECTION 0.25 MG/5ML
0.2500 mg | Freq: Once | INTRAVENOUS | Status: AC
  Administered 2018-08-31: 0.25 mg via INTRAVENOUS

## 2018-08-31 MED ORDER — DEXAMETHASONE SODIUM PHOSPHATE 10 MG/ML IJ SOLN
10.0000 mg | Freq: Once | INTRAMUSCULAR | Status: AC
  Administered 2018-08-31: 10 mg via INTRAVENOUS

## 2018-08-31 MED ORDER — ALTEPLASE 2 MG IJ SOLR
2.0000 mg | Freq: Once | INTRAMUSCULAR | Status: AC
  Administered 2018-08-31: 12:00:00 2 mg
  Filled 2018-08-31: qty 2

## 2018-08-31 MED ORDER — ACETAMINOPHEN 325 MG PO TABS
650.0000 mg | ORAL_TABLET | Freq: Once | ORAL | Status: AC
  Administered 2018-08-31: 650 mg via ORAL

## 2018-08-31 MED ORDER — ALTEPLASE 2 MG IJ SOLR
INTRAMUSCULAR | Status: AC
  Filled 2018-08-31: qty 2

## 2018-08-31 MED ORDER — SODIUM CHLORIDE 0.9% FLUSH
10.0000 mL | Freq: Once | INTRAVENOUS | Status: DC
  Filled 2018-08-31: qty 10

## 2018-08-31 MED ORDER — ACETAMINOPHEN 325 MG PO TABS
ORAL_TABLET | ORAL | Status: AC
  Filled 2018-08-31: qty 2

## 2018-08-31 MED ORDER — LEUCOVORIN CALCIUM INJECTION 350 MG
400.0000 mg/m2 | Freq: Once | INTRAVENOUS | Status: AC
  Administered 2018-08-31: 712 mg via INTRAVENOUS
  Filled 2018-08-31: qty 25

## 2018-08-31 NOTE — Patient Instructions (Addendum)
[facility] Discharge Instructions for Patients Receiving Chemotherapy  Today you received the following chemotherapy agents: Oxaliplatin, Leucovorin, Fluorouracil injection and pump   To help prevent nausea and vomiting after your treatment, we encourage you to take your nausea medication as directed.    If you develop nausea and vomiting that is not controlled by your nausea medication, call the clinic.   BELOW ARE SYMPTOMS THAT SHOULD BE REPORTED IMMEDIATELY:  *FEVER GREATER THAN 100.5 F  *CHILLS WITH OR WITHOUT FEVER  NAUSEA AND VOMITING THAT IS NOT CONTROLLED WITH YOUR NAUSEA MEDICATION  *UNUSUAL SHORTNESS OF BREATH  *UNUSUAL BRUISING OR BLEEDING  TENDERNESS IN MOUTH AND THROAT WITH OR WITHOUT PRESENCE OF ULCERS  *URINARY PROBLEMS  *BOWEL PROBLEMS  UNUSUAL RASH Items with * indicate a potential emergency and should be followed up as soon as possible.  Feel free to call the clinic should you have any questions or concerns. The clinic phone number is [phone].  Please show the CHEMO ALERT CARD at check-in to the Emergency Department and triage nurse.    Leucovorin injection What is this medicine? LEUCOVORIN (loo koe VOR in) is used to prevent or treat the harmful effects of some medicines. This medicine is used to treat anemia caused by a low amount of folic acid in the body. It is also used with 5-fluorouracil (5-FU) to treat colon cancer. This medicine may be used for other purposes; ask your health care provider or pharmacist if you have questions. What should I tell my health care provider before I take this medicine? They need to know if you have any of these conditions:  anemia from low levels of vitamin B-12 in the blood  an unusual or allergic reaction to leucovorin, folic acid, other medicines, foods, dyes, or preservatives  pregnant or trying to get pregnant  breast-feeding How should I use this medicine? This medicine is for  injection into a muscle or into a vein. It is given by a health care professional in a hospital or clinic setting. Talk to your pediatrician regarding the use of this medicine in children. Special care may be needed. Overdosage: If you think you have taken too much of this medicine contact a poison control center or emergency room at once. NOTE: This medicine is only for you. Do not share this medicine with others. What if I miss a dose? This does not apply. What may interact with this medicine?  capecitabine  fluorouracil  phenobarbital  phenytoin  primidone  trimethoprim-sulfamethoxazole This list may not describe all possible interactions. Give your health care provider a list of all the medicines, herbs, non-prescription drugs, or dietary supplements you use. Also tell them if you smoke, drink alcohol, or use illegal drugs. Some items may interact with your medicine. What should I watch for while using this medicine? Your condition will be monitored carefully while you are receiving this medicine. This medicine may increase the side effects of 5-fluorouracil, 5-FU. Tell your doctor or health care professional if you have diarrhea or mouth sores that do not get better or that get worse. What side effects may I notice from receiving this medicine? Side effects that you should report to your doctor or health care professional as soon as possible:  allergic reactions like skin rash, itching or hives, swelling of the face, lips, or tongue  breathing problems  fever, infection  mouth sores  unusual bleeding or bruising  unusually weak or tired Side effects that usually do not require   medical attention (report to your doctor or health care professional if they continue or are bothersome):  constipation or diarrhea  loss of appetite  nausea, vomiting This list may not describe all possible side effects. Call your doctor for medical advice about side effects. You may report  side effects to FDA at 1-800-FDA-1088. Where should I keep my medicine? This drug is given in a hospital or clinic and will not be stored at home. NOTE: This sheet is a summary. It may not cover all possible information. If you have questions about this medicine, talk to your doctor, pharmacist, or health care provider.  2020 Elsevier/Gold Standard (2007-06-28 16:50:29) Fluorouracil, 5-FU injection What is this medicine? FLUOROURACIL, 5-FU (flure oh YOOR a sil) is a chemotherapy drug. It slows the growth of cancer cells. This medicine is used to treat many types of cancer like breast cancer, colon or rectal cancer, pancreatic cancer, and stomach cancer. This medicine may be used for other purposes; ask your health care provider or pharmacist if you have questions. COMMON BRAND NAME(S): Adrucil What should I tell my health care provider before I take this medicine? They need to know if you have any of these conditions:  blood disorders  dihydropyrimidine dehydrogenase (DPD) deficiency  infection (especially a virus infection such as chickenpox, cold sores, or herpes)  kidney disease  liver disease  malnourished, poor nutrition  recent or ongoing radiation therapy  an unusual or allergic reaction to fluorouracil, other chemotherapy, other medicines, foods, dyes, or preservatives  pregnant or trying to get pregnant  breast-feeding How should I use this medicine? This drug is given as an infusion or injection into a vein. It is administered in a hospital or clinic by a specially trained health care professional. Talk to your pediatrician regarding the use of this medicine in children. Special care may be needed. Overdosage: If you think you have taken too much of this medicine contact a poison control center or emergency room at once. NOTE: This medicine is only for you. Do not share this medicine with others. What if I miss a dose? It is important not to miss your dose. Call your  doctor or health care professional if you are unable to keep an appointment. What may interact with this medicine?  allopurinol  cimetidine  dapsone  digoxin  hydroxyurea  leucovorin  levamisole  medicines for seizures like ethotoin, fosphenytoin, phenytoin  medicines to increase blood counts like filgrastim, pegfilgrastim, sargramostim  medicines that treat or prevent blood clots like warfarin, enoxaparin, and dalteparin  methotrexate  metronidazole  pyrimethamine  some other chemotherapy drugs like busulfan, cisplatin, estramustine, vinblastine  trimethoprim  trimetrexate  vaccines Talk to your doctor or health care professional before taking any of these medicines:  acetaminophen  aspirin  ibuprofen  ketoprofen  naproxen This list may not describe all possible interactions. Give your health care provider a list of all the medicines, herbs, non-prescription drugs, or dietary supplements you use. Also tell them if you smoke, drink alcohol, or use illegal drugs. Some items may interact with your medicine. What should I watch for while using this medicine? Visit your doctor for checks on your progress. This drug may make you feel generally unwell. This is not uncommon, as chemotherapy can affect healthy cells as well as cancer cells. Report any side effects. Continue your course of treatment even though you feel ill unless your doctor tells you to stop. In some cases, you may be given additional medicines to help with   side effects. Follow all directions for their use. Call your doctor or health care professional for advice if you get a fever, chills or sore throat, or other symptoms of a cold or flu. Do not treat yourself. This drug decreases your body's ability to fight infections. Try to avoid being around people who are sick. This medicine may increase your risk to bruise or bleed. Call your doctor or health care professional if you notice any unusual  bleeding. Be careful brushing and flossing your teeth or using a toothpick because you may get an infection or bleed more easily. If you have any dental work done, tell your dentist you are receiving this medicine. Avoid taking products that contain aspirin, acetaminophen, ibuprofen, naproxen, or ketoprofen unless instructed by your doctor. These medicines may hide a fever. Do not become pregnant while taking this medicine. Women should inform their doctor if they wish to become pregnant or think they might be pregnant. There is a potential for serious side effects to an unborn child. Talk to your health care professional or pharmacist for more information. Do not breast-feed an infant while taking this medicine. Men should inform their doctor if they wish to father a child. This medicine may lower sperm counts. Do not treat diarrhea with over the counter products. Contact your doctor if you have diarrhea that lasts more than 2 days or if it is severe and watery. This medicine can make you more sensitive to the sun. Keep out of the sun. If you cannot avoid being in the sun, wear protective clothing and use sunscreen. Do not use sun lamps or tanning beds/booths. What side effects may I notice from receiving this medicine? Side effects that you should report to your doctor or health care professional as soon as possible:  allergic reactions like skin rash, itching or hives, swelling of the face, lips, or tongue  low blood counts - this medicine may decrease the number of white blood cells, red blood cells and platelets. You may be at increased risk for infections and bleeding.  signs of infection - fever or chills, cough, sore throat, pain or difficulty passing urine  signs of decreased platelets or bleeding - bruising, pinpoint red spots on the skin, black, tarry stools, blood in the urine  signs of decreased red blood cells - unusually weak or tired, fainting spells, lightheadedness  breathing  problems  changes in vision  chest pain  mouth sores  nausea and vomiting  pain, swelling, redness at site where injected  pain, tingling, numbness in the hands or feet  redness, swelling, or sores on hands or feet  stomach pain  unusual bleeding Side effects that usually do not require medical attention (report to your doctor or health care professional if they continue or are bothersome):  changes in finger or toe nails  diarrhea  dry or itchy skin  hair loss  headache  loss of appetite  sensitivity of eyes to the light  stomach upset  unusually teary eyes This list may not describe all possible side effects. Call your doctor for medical advice about side effects. You may report side effects to FDA at 1-800-FDA-1088. Where should I keep my medicine? This drug is given in a hospital or clinic and will not be stored at home. NOTE: This sheet is a summary. It may not cover all possible information. If you have questions about this medicine, talk to your doctor, pharmacist, or health care provider.  2020 Elsevier/Gold Standard (2007-04-27 13:53:16) Oxaliplatin   Injection What is this medicine? OXALIPLATIN (ox AL i PLA tin) is a chemotherapy drug. It targets fast dividing cells, like cancer cells, and causes these cells to die. This medicine is used to treat cancers of the colon and rectum, and many other cancers. This medicine may be used for other purposes; ask your health care provider or pharmacist if you have questions. COMMON BRAND NAME(S): Eloxatin What should I tell my health care provider before I take this medicine? They need to know if you have any of these conditions:  kidney disease  an unusual or allergic reaction to oxaliplatin, other chemotherapy, other medicines, foods, dyes, or preservatives  pregnant or trying to get pregnant  breast-feeding How should I use this medicine? This drug is given as an infusion into a vein. It is administered in a  hospital or clinic by a specially trained health care professional. Talk to your pediatrician regarding the use of this medicine in children. Special care may be needed. Overdosage: If you think you have taken too much of this medicine contact a poison control center or emergency room at once. NOTE: This medicine is only for you. Do not share this medicine with others. What if I miss a dose? It is important not to miss a dose. Call your doctor or health care professional if you are unable to keep an appointment. What may interact with this medicine?  medicines to increase blood counts like filgrastim, pegfilgrastim, sargramostim  probenecid  some antibiotics like amikacin, gentamicin, neomycin, polymyxin B, streptomycin, tobramycin  zalcitabine Talk to your doctor or health care professional before taking any of these medicines:  acetaminophen  aspirin  ibuprofen  ketoprofen  naproxen This list may not describe all possible interactions. Give your health care provider a list of all the medicines, herbs, non-prescription drugs, or dietary supplements you use. Also tell them if you smoke, drink alcohol, or use illegal drugs. Some items may interact with your medicine. What should I watch for while using this medicine? Your condition will be monitored carefully while you are receiving this medicine. You will need important blood work done while you are taking this medicine. This medicine can make you more sensitive to cold. Do not drink cold drinks or use ice. Cover exposed skin before coming in contact with cold temperatures or cold objects. When out in cold weather wear warm clothing and cover your mouth and nose to warm the air that goes into your lungs. Tell your doctor if you get sensitive to the cold. This drug may make you feel generally unwell. This is not uncommon, as chemotherapy can affect healthy cells as well as cancer cells. Report any side effects. Continue your course of  treatment even though you feel ill unless your doctor tells you to stop. In some cases, you may be given additional medicines to help with side effects. Follow all directions for their use. Call your doctor or health care professional for advice if you get a fever, chills or sore throat, or other symptoms of a cold or flu. Do not treat yourself. This drug decreases your body's ability to fight infections. Try to avoid being around people who are sick. This medicine may increase your risk to bruise or bleed. Call your doctor or health care professional if you notice any unusual bleeding. Be careful brushing and flossing your teeth or using a toothpick because you may get an infection or bleed more easily. If you have any dental work done, tell your dentist you  are receiving this medicine. Avoid taking products that contain aspirin, acetaminophen, ibuprofen, naproxen, or ketoprofen unless instructed by your doctor. These medicines may hide a fever. Do not become pregnant while taking this medicine. Women should inform their doctor if they wish to become pregnant or think they might be pregnant. There is a potential for serious side effects to an unborn child. Talk to your health care professional or pharmacist for more information. Do not breast-feed an infant while taking this medicine. Call your doctor or health care professional if you get diarrhea. Do not treat yourself. What side effects may I notice from receiving this medicine? Side effects that you should report to your doctor or health care professional as soon as possible:  allergic reactions like skin rash, itching or hives, swelling of the face, lips, or tongue  low blood counts - This drug may decrease the number of white blood cells, red blood cells and platelets. You may be at increased risk for infections and bleeding.  signs of infection - fever or chills, cough, sore throat, pain or difficulty passing urine  signs of decreased  platelets or bleeding - bruising, pinpoint red spots on the skin, black, tarry stools, nosebleeds  signs of decreased red blood cells - unusually weak or tired, fainting spells, lightheadedness  breathing problems  chest pain, pressure  cough  diarrhea  jaw tightness  mouth sores  nausea and vomiting  pain, swelling, redness or irritation at the injection site  pain, tingling, numbness in the hands or feet  problems with balance, talking, walking  redness, blistering, peeling or loosening of the skin, including inside the mouth  trouble passing urine or change in the amount of urine Side effects that usually do not require medical attention (report to your doctor or health care professional if they continue or are bothersome):  changes in vision  constipation  hair loss  loss of appetite  metallic taste in the mouth or changes in taste  stomach pain This list may not describe all possible side effects. Call your doctor for medical advice about side effects. You may report side effects to FDA at 1-800-FDA-1088. Where should I keep my medicine? This drug is given in a hospital or clinic and will not be stored at home. NOTE: This sheet is a summary. It may not cover all possible information. If you have questions about this medicine, talk to your doctor, pharmacist, or health care provider.  2020 Elsevier/Gold Standard (2007-07-19 17:22:47) [facility] Discharge Instructions for Patients receiving Home Portable Chemo Pump    **The bag should finish at 46 hours, 96 hours or 7 days. For example, if your pump is scheduled for 46 hours and it was put on at 4pm, it should finish at 2 pm the day it is scheduled to come off regardless of your appointment time.    Estimated time to finish   _________________________ (Have your nurse fill in)     ** if the display on your pump reads "Low Volume" and it is beeping, take the batteries out of the pump and come to  the cancer center for it to be taken off.   **If the pump alarms go off prior to the pump reading "Low Volume" then call the 1-800-315-3287 and someone can assist you.  **If the plunger comes out and the bag fluid is running out, please use your chemo spill kit to clean up the spill. Do not use paper towels or other house hold products.  **   If you have problems or questions regarding your pump, please call either the [phone] or the cancer center Monday-Friday 8:00am-4:30pm at [phone] and we will assist you.  If you are unable to get assistance then go to [facility] Emergency Room, ask the staff to contact the IV team for assistance.

## 2018-08-31 NOTE — Progress Notes (Signed)
Per Ned Card, NP ok to treat with today's ANC. Pt to receive Udenyca on Firday

## 2018-08-31 NOTE — Patient Instructions (Signed)

## 2018-08-31 NOTE — Progress Notes (Addendum)
Cody Suarez OFFICE PROGRESS NOTE   Diagnosis: Colon cancer  INTERVAL HISTORY:   Cody Suarez returns as scheduled.  He completed cycle 1 FOLFOX 08/17/2018.  He was transfused a unit of blood on 08/26/2018, hemoglobin 7.6.  He has felt better since the blood transfusion.  He had a few episodes of mild nausea after the chemotherapy.  He noted a few "blisters" at the tip of his tongue.  He used Magic mouthwash for 2 days and the sores resolved.  No diarrhea.  He did not experience cold sensitivity.  He denies abdominal pain.  Objective:  Vital signs in last 24 hours:  Blood pressure (!) 149/73, pulse 100, temperature 98.7 F (37.1 C), temperature source Oral, resp. rate 17, height _0  (1.778 m), weight 138 lb 12.8 oz (63 kg), SpO2 100 %.    HEENT: No thrush or ulcers. GI: Abdomen soft and nontender.  No hepatomegaly.  No mass. Vascular: No leg edema. Skin: Palms without erythema. Port-A-Cath without erythema.   Lab Results:  Lab Results  Component Value Date   WBC 3.4 (L) 08/31/2018   HGB 9.9 (L) 08/31/2018   HCT 31.8 (L) 08/31/2018   MCV 78.1 (L) 08/31/2018   PLT 302 08/31/2018   NEUTROABS 1.2 (L) 08/31/2018    Imaging:  No results found.  Medications: I have reviewed the patient's current medications.  Assessment/Plan: 1. 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 Negative for a BRAF mutation  Tumor invaded through the muscularis propria into pericolonic fatty tissue and involved the attached omentum.   Cycle 1 adjuvant Xeloda 05/28/2013.   Cycle 2 adjuvant Xeloda 06/18/2013.   Cycle 3 adjuvant Xeloda 07/09/2013.   Cycle 4 adjuvant Xeloda 07/30/2013.   Cycle 5 adjuvant Xeloda 08/20/2013.  Cycle 6 adjuvant Xeloda 09/11/2013.  cycle 7 adjuvant Xeloda 10/02/2013   Cycle 8 adjuvant Xeloda 10/23/2013  CTs of the chest, abdomen, and pelvis on 07/18/2014-negative for recurrent colon cancer   CTs abdomen/pelvis 09/06/2014 showed acute cholecystitis.  Colonoscopy 08/02/2015-chronic colitis, no malignancy  CTs 09/17/2015, new mass near the tail the pancreas, stable lung nodules  Biopsy mesenteric mass 10/04/2015 with metastatic adenocarcinoma consistent with colorectal primary  PET scan 10/10/2015 with soft tissue thickening within the peritoneal space of the left upper quadrant with SUV Max 7.9; no additional sites of peritoneal nodularity to suggest metastasis; hypermetabolic ill-defined nodule in the left lower lobe favored inflammatory.  Resection of the left abdominal mesenteric mass/tail the pancreas on 11/05/2015 with the pathology confirming metastatic colon cancer, positive "serosal" margin  CTs 07/29/2016-no evidence of metastatic disease, stable small lung nodules, decreased size of pancreatic resection bed fluid/mass  CTs 06/28/2017-decreased fluid and increased soft tissue at the area of the distal pancreatectomy with a few foci of gas  CT 09/29/2017-enlargement of left upper quadrant soft tissue mass between the stomach, spleen, and pancreas tail, stable nodules at the lung bases  CT biopsy of the upper quadrant mass 10/19/2017-metastatic colon cancer, MSS, tumor mutation burden 1, no KRAS, BRAF, or NRAS mutation  CTs 12/24/2017-complex left upper quadrant process involving the stomach, spleen, tail the pancreas, and distal transverse colon.  CT 04/27/2018-stable to mild progression of the left upper quadrant mass, compression of the stomach with no obstruction, no other evidence of metastatic disease, resolution of small fluid collection lateral to the left upper quadrant mass  CT 07/21/2018-enlargement  of the left upper quadrant mass with involvement of the posterior stomach and spleen. Possible new right upper lung nodule  Cycle 1  FOLFOX 08/17/2018  Cycle 2 FOLFOX 08/31/2018, Udenyca added 2. Ulcerative colitis-extensive chronic active ulcerative colitis was noted on the colon resection specimen 03/28/2013.  Followed by Dr.Magod 3. Hypertension.  4. History of microcytic anemia-likely iron deficiency, unable to tolerate oral iron. Improved. 5. Possible area of cecal wall thickening noted on abdominal CT 03/20/2013. 6. Family history of colon cancer (maternal grandfather died in his 23s with colon cancer). 7. Bilateral calf and low anterior leg pain. Bilateral lower extremity venous duplex negative for DVT 07/14/2013. Right ABI with moderate, borderline severe arterial insufficiency; left ABI suggestive of moderate arterial insufficiency. He was referred to vascular. 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 TEE showed thrombus in the descending aorta. He underwent right popliteal to peroneal artery bypass graft, thrombectomy anterior tibialis and attempted thrombectomy tibioperoneal trunk and posterior tibialis on 08/11/2013. Right calf pain 09/26/2013 with findings of occlusion of right popliteal to peroneal bypass status post thrombolysis. Now on anticoagulation with Xarelto.  recurrent pain and ulceration at the right foot, status post a popliteal to posterior tibial artery bypass using a cephalic vein graft on 37/16/9678  Xarelto discontinued due to high risk for bleeding and aspirin 81 mg daily initiated hospitalization July 2020 8. Status post laparoscopic cholecystectomy 09/08/2014 9. Anorexia following the mesenteric mass/pancreas tail resection-improved 10. Abdominal abscess November 2017-pancreas "cysts "drainage catheter placed 11/26/2015; CT 02/04/2016 with no change to slight decreasein size of residual pancreatic tail fluid collection.Drainage catheter removed approximately early March 2018  CT 02/20/2016-new small left pleural effusion, increased size of the surgical bed fluid  collection, indeterminate pulmonary nodules  CT 07/29/2016-decreased fluid/soft tissue density near the pancreas is no evidence of metastatic disease, stable subcentimeter lung nodules 11.Admission 12/24/2017 with an abdominal abscess, status post placement of a percutaneous drain and antibiotic therapy; CTs abdomen/pelvis 01/04/2018-near complete drainage of previously noted complex fluid collection in the left upper quadrant. 2 small residual collections remain. Residual soft tissue mass at the clips concerning for residual recurrent neoplasm. Peritoneal wall thickening also concerning for metastatic disease. Drain removed 01/04/2018  CT 01/18/2018-no change in soft tissue mass at the tail of pancreas with involvement of the gastric fundus and spleen, there is central low attenuation  Admission 01/20/2018 with fever and failure to thrive, no drainable fluid collection, placed on IV antibiotics, discharged to complete an outpatient course of IV ceftriaxone and Flagyl completed 02/20/2018  12.Admission 7/26/2020transient speech abnormality and microcytic anemia  13.  Port-A-Cath placement, Dr. Hassell Done, 08/15/2018  Disposition: Cody Suarez appears stable.  He has completed 1 cycle of FOLFOX.  Aside from a few sores on the tip of his tongue he tolerated treatment well.  Plan to proceed with cycle 2 today as scheduled.  We reviewed the CBC from today.  He has mild neutropenia.  Precautions reviewed.  He will receive Udenyca on the day of pump discontinuation.  We reviewed potential toxicities associated with Udenyca including skin rash, bone pain, splenic rupture.  He agrees to proceed.  He will return for lab, follow-up, cycle 3 FOLFOX in 2 weeks.  He will contact the office in the interim with any problems.  Patient seen with Dr. Benay Spice.  25 minutes were spent face-to-face at today's visit with the majority of that time involved in counseling/coordination of care.    Ned Card  ANP/GNP-BC   08/31/2018  1:05 PM This  was a shared visit with Ned Card.  Cody Suarez has mild neutropenia.  We discussed the risk of proceeding with chemotherapy versus a treatment delay.  He agrees to proceed with chemotherapy.  He will receive G-CSF support.  He will call for a fever.  We will dose reduce the 5-fluorouracil if he develops mucositis after this cycle. Julieanne Manson, MD

## 2018-09-01 ENCOUNTER — Telehealth: Payer: Self-pay | Admitting: Oncology

## 2018-09-01 NOTE — Telephone Encounter (Signed)
Called and spoke with patient. Confirmed appts

## 2018-09-02 ENCOUNTER — Other Ambulatory Visit: Payer: Self-pay

## 2018-09-02 ENCOUNTER — Inpatient Hospital Stay: Payer: Medicare Other

## 2018-09-02 VITALS — BP 119/66 | HR 100 | Temp 99.8°F | Resp 18

## 2018-09-02 DIAGNOSIS — C189 Malignant neoplasm of colon, unspecified: Secondary | ICD-10-CM

## 2018-09-02 DIAGNOSIS — C7889 Secondary malignant neoplasm of other digestive organs: Secondary | ICD-10-CM | POA: Diagnosis not present

## 2018-09-02 DIAGNOSIS — Z5189 Encounter for other specified aftercare: Secondary | ICD-10-CM | POA: Diagnosis not present

## 2018-09-02 DIAGNOSIS — D649 Anemia, unspecified: Secondary | ICD-10-CM | POA: Diagnosis not present

## 2018-09-02 DIAGNOSIS — Z452 Encounter for adjustment and management of vascular access device: Secondary | ICD-10-CM | POA: Diagnosis not present

## 2018-09-02 DIAGNOSIS — Z5111 Encounter for antineoplastic chemotherapy: Secondary | ICD-10-CM | POA: Diagnosis not present

## 2018-09-02 DIAGNOSIS — C184 Malignant neoplasm of transverse colon: Secondary | ICD-10-CM | POA: Diagnosis not present

## 2018-09-02 MED ORDER — HEPARIN SOD (PORK) LOCK FLUSH 100 UNIT/ML IV SOLN
500.0000 [IU] | Freq: Once | INTRAVENOUS | Status: AC | PRN
  Administered 2018-09-02: 500 [IU]
  Filled 2018-09-02: qty 5

## 2018-09-02 MED ORDER — SODIUM CHLORIDE 0.9% FLUSH
10.0000 mL | INTRAVENOUS | Status: DC | PRN
  Administered 2018-09-02: 10 mL
  Filled 2018-09-02: qty 10

## 2018-09-02 MED ORDER — PEGFILGRASTIM-CBQV 6 MG/0.6ML ~~LOC~~ SOSY
6.0000 mg | PREFILLED_SYRINGE | Freq: Once | SUBCUTANEOUS | Status: AC
  Administered 2018-09-02: 6 mg via SUBCUTANEOUS

## 2018-09-02 NOTE — Patient Instructions (Signed)

## 2018-09-05 ENCOUNTER — Telehealth: Payer: Self-pay

## 2018-09-05 NOTE — Telephone Encounter (Signed)
Called patient as requested to confirm his appointment schedule on 9/24. No further questions at this time

## 2018-09-09 ENCOUNTER — Other Ambulatory Visit: Payer: Self-pay | Admitting: Cardiovascular Disease

## 2018-09-09 DIAGNOSIS — I10 Essential (primary) hypertension: Secondary | ICD-10-CM

## 2018-09-13 NOTE — Telephone Encounter (Signed)
Please review for refill. Thanks!  

## 2018-09-14 ENCOUNTER — Inpatient Hospital Stay: Payer: Medicare Other

## 2018-09-14 ENCOUNTER — Inpatient Hospital Stay (HOSPITAL_BASED_OUTPATIENT_CLINIC_OR_DEPARTMENT_OTHER): Payer: Medicare Other | Admitting: Oncology

## 2018-09-14 ENCOUNTER — Other Ambulatory Visit: Payer: Self-pay

## 2018-09-14 ENCOUNTER — Inpatient Hospital Stay: Payer: Medicare Other | Attending: Oncology

## 2018-09-14 VITALS — BP 156/75 | HR 110 | Temp 98.7°F | Resp 19 | Ht 70.0 in | Wt 136.2 lb

## 2018-09-14 DIAGNOSIS — Z23 Encounter for immunization: Secondary | ICD-10-CM

## 2018-09-14 DIAGNOSIS — C799 Secondary malignant neoplasm of unspecified site: Secondary | ICD-10-CM

## 2018-09-14 DIAGNOSIS — Z5111 Encounter for antineoplastic chemotherapy: Secondary | ICD-10-CM | POA: Diagnosis not present

## 2018-09-14 DIAGNOSIS — C189 Malignant neoplasm of colon, unspecified: Secondary | ICD-10-CM

## 2018-09-14 DIAGNOSIS — Z5189 Encounter for other specified aftercare: Secondary | ICD-10-CM | POA: Insufficient documentation

## 2018-09-14 DIAGNOSIS — C7889 Secondary malignant neoplasm of other digestive organs: Secondary | ICD-10-CM | POA: Diagnosis not present

## 2018-09-14 DIAGNOSIS — Z95828 Presence of other vascular implants and grafts: Secondary | ICD-10-CM

## 2018-09-14 DIAGNOSIS — C184 Malignant neoplasm of transverse colon: Secondary | ICD-10-CM | POA: Diagnosis not present

## 2018-09-14 LAB — CMP (CANCER CENTER ONLY)
ALT: <6 U/L (ref 0–44)
AST: 18 U/L (ref 15–41)
Albumin: 3.2 g/dL — ABNORMAL LOW (ref 3.5–5.0)
Alkaline Phosphatase: 114 U/L (ref 38–126)
Anion gap: 12 (ref 5–15)
BUN: 9 mg/dL (ref 8–23)
CO2: 17 mmol/L — ABNORMAL LOW (ref 22–32)
Calcium: 8.9 mg/dL (ref 8.9–10.3)
Chloride: 106 mmol/L (ref 98–111)
Creatinine: 0.99 mg/dL (ref 0.61–1.24)
GFR, Est AFR Am: >60 mL/min (ref >60)
GFR, Estimated: >60 mL/min (ref >60)
Glucose, Bld: 111 mg/dL — ABNORMAL HIGH (ref 70–99)
Potassium: 3.3 mmol/L — ABNORMAL LOW (ref 3.5–5.1)
Sodium: 135 mmol/L (ref 135–145)
Total Bilirubin: 0.3 mg/dL (ref 0.3–1.2)
Total Protein: 7 g/dL (ref 6.5–8.1)

## 2018-09-14 LAB — CBC WITH DIFFERENTIAL (CANCER CENTER ONLY)
Abs Immature Granulocytes: 1.15 10*3/uL — ABNORMAL HIGH (ref 0.00–0.07)
Basophils Absolute: 0.2 10*3/uL — ABNORMAL HIGH (ref 0.0–0.1)
Basophils Relative: 1 %
Eosinophils Absolute: 0.2 10*3/uL (ref 0.0–0.5)
Eosinophils Relative: 1 %
HCT: 32.4 % — ABNORMAL LOW (ref 39.0–52.0)
Hemoglobin: 10.3 g/dL — ABNORMAL LOW (ref 13.0–17.0)
Immature Granulocytes: 6 %
Lymphocytes Relative: 11 %
Lymphs Abs: 2.2 10*3/uL (ref 0.7–4.0)
MCH: 26.1 pg (ref 26.0–34.0)
MCHC: 31.8 g/dL (ref 30.0–36.0)
MCV: 82.2 fL (ref 80.0–100.0)
Monocytes Absolute: 2.1 10*3/uL — ABNORMAL HIGH (ref 0.1–1.0)
Monocytes Relative: 11 %
Neutro Abs: 13.7 10*3/uL — ABNORMAL HIGH (ref 1.7–7.7)
Neutrophils Relative %: 70 %
Platelet Count: 284 10*3/uL (ref 150–400)
RBC: 3.94 MIL/uL — ABNORMAL LOW (ref 4.22–5.81)
RDW: 29 % — ABNORMAL HIGH (ref 11.5–15.5)
WBC Count: 19.5 10*3/uL — ABNORMAL HIGH (ref 4.0–10.5)
nRBC: 0.4 % — ABNORMAL HIGH (ref 0.0–0.2)

## 2018-09-14 MED ORDER — INFLUENZA VAC SPLIT QUAD 0.5 ML IM SUSY
0.5000 mL | PREFILLED_SYRINGE | Freq: Once | INTRAMUSCULAR | Status: AC
  Administered 2018-09-14: 0.5 mL via INTRAMUSCULAR
  Filled 2018-09-14: qty 0.5

## 2018-09-14 MED ORDER — HEPARIN SOD (PORK) LOCK FLUSH 100 UNIT/ML IV SOLN
500.0000 [IU] | Freq: Once | INTRAVENOUS | Status: DC | PRN
  Filled 2018-09-14: qty 5

## 2018-09-14 MED ORDER — OXALIPLATIN CHEMO INJECTION 100 MG/20ML
85.0000 mg/m2 | Freq: Once | INTRAVENOUS | Status: AC
  Administered 2018-09-14: 14:00:00 150 mg via INTRAVENOUS
  Filled 2018-09-14: qty 10

## 2018-09-14 MED ORDER — LEUCOVORIN CALCIUM INJECTION 350 MG
400.0000 mg/m2 | Freq: Once | INTRAVENOUS | Status: AC
  Administered 2018-09-14: 14:00:00 712 mg via INTRAVENOUS
  Filled 2018-09-14: qty 35.6

## 2018-09-14 MED ORDER — DEXAMETHASONE SODIUM PHOSPHATE 10 MG/ML IJ SOLN
10.0000 mg | Freq: Once | INTRAMUSCULAR | Status: AC
  Administered 2018-09-14: 10 mg via INTRAVENOUS

## 2018-09-14 MED ORDER — SODIUM CHLORIDE 0.9% FLUSH
10.0000 mL | Freq: Once | INTRAVENOUS | Status: AC
  Administered 2018-09-14: 11:00:00 10 mL
  Filled 2018-09-14: qty 10

## 2018-09-14 MED ORDER — PALONOSETRON HCL INJECTION 0.25 MG/5ML
0.2500 mg | Freq: Once | INTRAVENOUS | Status: AC
  Administered 2018-09-14: 0.25 mg via INTRAVENOUS

## 2018-09-14 MED ORDER — SODIUM CHLORIDE 0.9% FLUSH
10.0000 mL | INTRAVENOUS | Status: DC | PRN
  Filled 2018-09-14: qty 10

## 2018-09-14 MED ORDER — DEXTROSE 5 % IV SOLN
Freq: Once | INTRAVENOUS | Status: AC
  Administered 2018-09-14: 13:00:00 via INTRAVENOUS
  Filled 2018-09-14: qty 250

## 2018-09-14 MED ORDER — PALONOSETRON HCL INJECTION 0.25 MG/5ML
INTRAVENOUS | Status: AC
  Filled 2018-09-14: qty 5

## 2018-09-14 MED ORDER — DEXAMETHASONE SODIUM PHOSPHATE 10 MG/ML IJ SOLN
INTRAMUSCULAR | Status: AC
  Filled 2018-09-14: qty 1

## 2018-09-14 MED ORDER — SODIUM CHLORIDE 0.9 % IV SOLN
2400.0000 mg/m2 | INTRAVENOUS | Status: DC
  Administered 2018-09-14: 16:00:00 4250 mg via INTRAVENOUS
  Filled 2018-09-14: qty 85

## 2018-09-14 MED ORDER — FLUOROURACIL CHEMO INJECTION 2.5 GM/50ML
400.0000 mg/m2 | Freq: Once | INTRAVENOUS | Status: AC
  Administered 2018-09-14: 16:00:00 700 mg via INTRAVENOUS
  Filled 2018-09-14: qty 14

## 2018-09-14 NOTE — Addendum Note (Signed)
Addended by: Tania Ade on: 09/14/2018 12:35 PM   Modules accepted: Orders

## 2018-09-14 NOTE — Patient Instructions (Addendum)
[facility] Discharge Instructions for Patients Receiving Chemotherapy  Today you received the following chemotherapy agents: Oxaliplatin, Leucovorin, Fluorouracil injection and pump   To help prevent nausea and vomiting after your treatment, we encourage you to take your nausea medication as directed.    If you develop nausea and vomiting that is not controlled by your nausea medication, call the clinic.   BELOW ARE SYMPTOMS THAT SHOULD BE REPORTED IMMEDIATELY:  *FEVER GREATER THAN 100.5 F  *CHILLS WITH OR WITHOUT FEVER  NAUSEA AND VOMITING THAT IS NOT CONTROLLED WITH YOUR NAUSEA MEDICATION  *UNUSUAL SHORTNESS OF BREATH  *UNUSUAL BRUISING OR BLEEDING  TENDERNESS IN MOUTH AND THROAT WITH OR WITHOUT PRESENCE OF ULCERS  *URINARY PROBLEMS  *BOWEL PROBLEMS  UNUSUAL RASH Items with * indicate a potential emergency and should be followed up as soon as possible.  Feel free to call the clinic should you have any questions or concerns. The clinic phone number is [phone].  Please show the CHEMO ALERT CARD at check-in to the Emergency Department and triage nurse.    Leucovorin injection What is this medicine? LEUCOVORIN (loo koe VOR in) is used to prevent or treat the harmful effects of some medicines. This medicine is used to treat anemia caused by a low amount of folic acid in the body. It is also used with 5-fluorouracil (5-FU) to treat colon cancer. This medicine may be used for other purposes; ask your health care provider or pharmacist if you have questions. What should I tell my health care provider before I take this medicine? They need to know if you have any of these conditions:  anemia from low levels of vitamin B-12 in the blood  an unusual or allergic reaction to leucovorin, folic acid, other medicines, foods, dyes, or preservatives  pregnant or trying to get pregnant  breast-feeding How should I use this medicine? This medicine is for  injection into a muscle or into a vein. It is given by a health care professional in a hospital or clinic setting. Talk to your pediatrician regarding the use of this medicine in children. Special care may be needed. Overdosage: If you think you have taken too much of this medicine contact a poison control center or emergency room at once. NOTE: This medicine is only for you. Do not share this medicine with others. What if I miss a dose? This does not apply. What may interact with this medicine?  capecitabine  fluorouracil  phenobarbital  phenytoin  primidone  trimethoprim-sulfamethoxazole This list may not describe all possible interactions. Give your health care provider a list of all the medicines, herbs, non-prescription drugs, or dietary supplements you use. Also tell them if you smoke, drink alcohol, or use illegal drugs. Some items may interact with your medicine. What should I watch for while using this medicine? Your condition will be monitored carefully while you are receiving this medicine. This medicine may increase the side effects of 5-fluorouracil, 5-FU. Tell your doctor or health care professional if you have diarrhea or mouth sores that do not get better or that get worse. What side effects may I notice from receiving this medicine? Side effects that you should report to your doctor or health care professional as soon as possible:  allergic reactions like skin rash, itching or hives, swelling of the face, lips, or tongue  breathing problems  fever, infection  mouth sores  unusual bleeding or bruising  unusually weak or tired Side effects that usually do not require   medical attention (report to your doctor or health care professional if they continue or are bothersome):  constipation or diarrhea  loss of appetite  nausea, vomiting This list may not describe all possible side effects. Call your doctor for medical advice about side effects. You may report  side effects to FDA at 1-800-FDA-1088. Where should I keep my medicine? This drug is given in a hospital or clinic and will not be stored at home. NOTE: This sheet is a summary. It may not cover all possible information. If you have questions about this medicine, talk to your doctor, pharmacist, or health care provider.  2020 Elsevier/Gold Standard (2007-06-28 16:50:29) Fluorouracil, 5-FU injection What is this medicine? FLUOROURACIL, 5-FU (flure oh YOOR a sil) is a chemotherapy drug. It slows the growth of cancer cells. This medicine is used to treat many types of cancer like breast cancer, colon or rectal cancer, pancreatic cancer, and stomach cancer. This medicine may be used for other purposes; ask your health care provider or pharmacist if you have questions. COMMON BRAND NAME(S): Adrucil What should I tell my health care provider before I take this medicine? They need to know if you have any of these conditions:  blood disorders  dihydropyrimidine dehydrogenase (DPD) deficiency  infection (especially a virus infection such as chickenpox, cold sores, or herpes)  kidney disease  liver disease  malnourished, poor nutrition  recent or ongoing radiation therapy  an unusual or allergic reaction to fluorouracil, other chemotherapy, other medicines, foods, dyes, or preservatives  pregnant or trying to get pregnant  breast-feeding How should I use this medicine? This drug is given as an infusion or injection into a vein. It is administered in a hospital or clinic by a specially trained health care professional. Talk to your pediatrician regarding the use of this medicine in children. Special care may be needed. Overdosage: If you think you have taken too much of this medicine contact a poison control center or emergency room at once. NOTE: This medicine is only for you. Do not share this medicine with others. What if I miss a dose? It is important not to miss your dose. Call your  doctor or health care professional if you are unable to keep an appointment. What may interact with this medicine?  allopurinol  cimetidine  dapsone  digoxin  hydroxyurea  leucovorin  levamisole  medicines for seizures like ethotoin, fosphenytoin, phenytoin  medicines to increase blood counts like filgrastim, pegfilgrastim, sargramostim  medicines that treat or prevent blood clots like warfarin, enoxaparin, and dalteparin  methotrexate  metronidazole  pyrimethamine  some other chemotherapy drugs like busulfan, cisplatin, estramustine, vinblastine  trimethoprim  trimetrexate  vaccines Talk to your doctor or health care professional before taking any of these medicines:  acetaminophen  aspirin  ibuprofen  ketoprofen  naproxen This list may not describe all possible interactions. Give your health care provider a list of all the medicines, herbs, non-prescription drugs, or dietary supplements you use. Also tell them if you smoke, drink alcohol, or use illegal drugs. Some items may interact with your medicine. What should I watch for while using this medicine? Visit your doctor for checks on your progress. This drug may make you feel generally unwell. This is not uncommon, as chemotherapy can affect healthy cells as well as cancer cells. Report any side effects. Continue your course of treatment even though you feel ill unless your doctor tells you to stop. In some cases, you may be given additional medicines to help with   side effects. Follow all directions for their use. Call your doctor or health care professional for advice if you get a fever, chills or sore throat, or other symptoms of a cold or flu. Do not treat yourself. This drug decreases your body's ability to fight infections. Try to avoid being around people who are sick. This medicine may increase your risk to bruise or bleed. Call your doctor or health care professional if you notice any unusual  bleeding. Be careful brushing and flossing your teeth or using a toothpick because you may get an infection or bleed more easily. If you have any dental work done, tell your dentist you are receiving this medicine. Avoid taking products that contain aspirin, acetaminophen, ibuprofen, naproxen, or ketoprofen unless instructed by your doctor. These medicines may hide a fever. Do not become pregnant while taking this medicine. Women should inform their doctor if they wish to become pregnant or think they might be pregnant. There is a potential for serious side effects to an unborn child. Talk to your health care professional or pharmacist for more information. Do not breast-feed an infant while taking this medicine. Men should inform their doctor if they wish to father a child. This medicine may lower sperm counts. Do not treat diarrhea with over the counter products. Contact your doctor if you have diarrhea that lasts more than 2 days or if it is severe and watery. This medicine can make you more sensitive to the sun. Keep out of the sun. If you cannot avoid being in the sun, wear protective clothing and use sunscreen. Do not use sun lamps or tanning beds/booths. What side effects may I notice from receiving this medicine? Side effects that you should report to your doctor or health care professional as soon as possible:  allergic reactions like skin rash, itching or hives, swelling of the face, lips, or tongue  low blood counts - this medicine may decrease the number of white blood cells, red blood cells and platelets. You may be at increased risk for infections and bleeding.  signs of infection - fever or chills, cough, sore throat, pain or difficulty passing urine  signs of decreased platelets or bleeding - bruising, pinpoint red spots on the skin, black, tarry stools, blood in the urine  signs of decreased red blood cells - unusually weak or tired, fainting spells, lightheadedness  breathing  problems  changes in vision  chest pain  mouth sores  nausea and vomiting  pain, swelling, redness at site where injected  pain, tingling, numbness in the hands or feet  redness, swelling, or sores on hands or feet  stomach pain  unusual bleeding Side effects that usually do not require medical attention (report to your doctor or health care professional if they continue or are bothersome):  changes in finger or toe nails  diarrhea  dry or itchy skin  hair loss  headache  loss of appetite  sensitivity of eyes to the light  stomach upset  unusually teary eyes This list may not describe all possible side effects. Call your doctor for medical advice about side effects. You may report side effects to FDA at 1-800-FDA-1088. Where should I keep my medicine? This drug is given in a hospital or clinic and will not be stored at home. NOTE: This sheet is a summary. It may not cover all possible information. If you have questions about this medicine, talk to your doctor, pharmacist, or health care provider.  2020 Elsevier/Gold Standard (2007-04-27 13:53:16) Oxaliplatin   Injection What is this medicine? OXALIPLATIN (ox AL i PLA tin) is a chemotherapy drug. It targets fast dividing cells, like cancer cells, and causes these cells to die. This medicine is used to treat cancers of the colon and rectum, and many other cancers. This medicine may be used for other purposes; ask your health care provider or pharmacist if you have questions. COMMON BRAND NAME(S): Eloxatin What should I tell my health care provider before I take this medicine? They need to know if you have any of these conditions:  kidney disease  an unusual or allergic reaction to oxaliplatin, other chemotherapy, other medicines, foods, dyes, or preservatives  pregnant or trying to get pregnant  breast-feeding How should I use this medicine? This drug is given as an infusion into a vein. It is administered in a  hospital or clinic by a specially trained health care professional. Talk to your pediatrician regarding the use of this medicine in children. Special care may be needed. Overdosage: If you think you have taken too much of this medicine contact a poison control center or emergency room at once. NOTE: This medicine is only for you. Do not share this medicine with others. What if I miss a dose? It is important not to miss a dose. Call your doctor or health care professional if you are unable to keep an appointment. What may interact with this medicine?  medicines to increase blood counts like filgrastim, pegfilgrastim, sargramostim  probenecid  some antibiotics like amikacin, gentamicin, neomycin, polymyxin B, streptomycin, tobramycin  zalcitabine Talk to your doctor or health care professional before taking any of these medicines:  acetaminophen  aspirin  ibuprofen  ketoprofen  naproxen This list may not describe all possible interactions. Give your health care provider a list of all the medicines, herbs, non-prescription drugs, or dietary supplements you use. Also tell them if you smoke, drink alcohol, or use illegal drugs. Some items may interact with your medicine. What should I watch for while using this medicine? Your condition will be monitored carefully while you are receiving this medicine. You will need important blood work done while you are taking this medicine. This medicine can make you more sensitive to cold. Do not drink cold drinks or use ice. Cover exposed skin before coming in contact with cold temperatures or cold objects. When out in cold weather wear warm clothing and cover your mouth and nose to warm the air that goes into your lungs. Tell your doctor if you get sensitive to the cold. This drug may make you feel generally unwell. This is not uncommon, as chemotherapy can affect healthy cells as well as cancer cells. Report any side effects. Continue your course of  treatment even though you feel ill unless your doctor tells you to stop. In some cases, you may be given additional medicines to help with side effects. Follow all directions for their use. Call your doctor or health care professional for advice if you get a fever, chills or sore throat, or other symptoms of a cold or flu. Do not treat yourself. This drug decreases your body's ability to fight infections. Try to avoid being around people who are sick. This medicine may increase your risk to bruise or bleed. Call your doctor or health care professional if you notice any unusual bleeding. Be careful brushing and flossing your teeth or using a toothpick because you may get an infection or bleed more easily. If you have any dental work done, tell your dentist you   are receiving this medicine. Avoid taking products that contain aspirin, acetaminophen, ibuprofen, naproxen, or ketoprofen unless instructed by your doctor. These medicines may hide a fever. Do not become pregnant while taking this medicine. Women should inform their doctor if they wish to become pregnant or think they might be pregnant. There is a potential for serious side effects to an unborn child. Talk to your health care professional or pharmacist for more information. Do not breast-feed an infant while taking this medicine. Call your doctor or health care professional if you get diarrhea. Do not treat yourself. What side effects may I notice from receiving this medicine? Side effects that you should report to your doctor or health care professional as soon as possible:  allergic reactions like skin rash, itching or hives, swelling of the face, lips, or tongue  low blood counts - This drug may decrease the number of white blood cells, red blood cells and platelets. You may be at increased risk for infections and bleeding.  signs of infection - fever or chills, cough, sore throat, pain or difficulty passing urine  signs of decreased  platelets or bleeding - bruising, pinpoint red spots on the skin, black, tarry stools, nosebleeds  signs of decreased red blood cells - unusually weak or tired, fainting spells, lightheadedness  breathing problems  chest pain, pressure  cough  diarrhea  jaw tightness  mouth sores  nausea and vomiting  pain, swelling, redness or irritation at the injection site  pain, tingling, numbness in the hands or feet  problems with balance, talking, walking  redness, blistering, peeling or loosening of the skin, including inside the mouth  trouble passing urine or change in the amount of urine Side effects that usually do not require medical attention (report to your doctor or health care professional if they continue or are bothersome):  changes in vision  constipation  hair loss  loss of appetite  metallic taste in the mouth or changes in taste  stomach pain This list may not describe all possible side effects. Call your doctor for medical advice about side effects. You may report side effects to FDA at 1-800-FDA-1088. Where should I keep my medicine? This drug is given in a hospital or clinic and will not be stored at home. NOTE: This sheet is a summary. It may not cover all possible information. If you have questions about this medicine, talk to your doctor, pharmacist, or health care provider.  2020 Elsevier/Gold Standard (2007-07-19 17:22:47) [facility] Discharge Instructions for Patients receiving Home Portable Chemo Pump    **The bag should finish at 46 hours, 96 hours or 7 days. For example, if your pump is scheduled for 46 hours and it was put on at 4pm, it should finish at 2 pm the day it is scheduled to come off regardless of your appointment time.    Estimated time to finish   _________________________ (Have your nurse fill in)     ** if the display on your pump reads "Low Volume" and it is beeping, take the batteries out of the pump and come to  the cancer center for it to be taken off.   **If the pump alarms go off prior to the pump reading "Low Volume" then call the 1-800-315-3287 and someone can assist you.  **If the plunger comes out and the bag fluid is running out, please use your chemo spill kit to clean up the spill. Do not use paper towels or other house hold products.  **   If you have problems or questions regarding your pump, please call either the 1-800-315-3287 or the cancer center Monday-Friday 8:00am-4:30pm at [phone] and we will assist you.  If you are unable to get assistance then go to [facility] Emergency Room, ask the staff to contact the IV team for assistance.     Influenza (Flu) Vaccine (Inactivated or Recombinant): What You Need to Know 1. Why get vaccinated? Influenza vaccine can prevent influenza (flu). Flu is a contagious disease that spreads around the [place] every year, usually between October and May. Anyone can get the flu, but it is more dangerous for some people. Infants and young children, people 65 years of age and older, pregnant women, and people with certain health conditions or a weakened immune system are at greatest risk of flu complications. Pneumonia, bronchitis, sinus infections and ear infections are examples of flu-related complications. If you have a medical condition, such as heart disease, cancer or diabetes, flu can make it worse. Flu can cause fever and chills, sore throat, muscle aches, fatigue, cough, headache, and runny or stuffy nose. Some people may have vomiting and diarrhea, though this is more common in children than adults. Each year thousands of people in the [place] die from flu, and many more are hospitalized. Flu vaccine prevents millions of illnesses and flu-related visits to the doctor each year. 2. Influenza vaccine CDC recommends everyone 6 months of age and older get vaccinated every flu season. Children 6 months through 8 years of age may need 2  doses during a single flu season. Everyone else needs only 1 dose each flu season. It takes about 2 weeks for protection to develop after vaccination. There are many flu viruses, and they are always changing. Each year a new flu vaccine is made to protect against three or four viruses that are likely to cause disease in the upcoming flu season. Even when the vaccine doesn't exactly match these viruses, it may still provide some protection. Influenza vaccine does not cause flu. Influenza vaccine may be given at the same time as other vaccines. 3. Talk with your health care provider Tell your vaccine provider if the person getting the vaccine:  Has had an allergic reaction after a previous dose of influenza vaccine, or has any severe, life-threatening allergies.  Has ever had Guillain-Barr Syndrome (also called GBS). In some cases, your health care provider may decide to postpone influenza vaccination to a future visit. People with minor illnesses, such as a cold, may be vaccinated. People who are moderately or severely ill should usually wait until they recover before getting influenza vaccine. Your health care provider can give you more information. 4. Risks of a vaccine reaction  Soreness, redness, and swelling where shot is given, fever, muscle aches, and headache can happen after influenza vaccine.  There may be a very small increased risk of Guillain-Barr Syndrome (GBS) after inactivated influenza vaccine (the flu shot). Young children who get the flu shot along with pneumococcal vaccine (PCV13), and/or DTaP vaccine at the same time might be slightly more likely to have a seizure caused by fever. Tell your health care provider if a child who is getting flu vaccine has ever had a seizure. People sometimes faint after medical procedures, including vaccination. Tell your provider if you feel dizzy or have vision changes or ringing in the ears. As with any medicine, there is a very remote  chance of a vaccine causing a severe allergic reaction, other serious injury, or death.   5. What if there is a serious problem? An allergic reaction could occur after the vaccinated person leaves the clinic. If you see signs of a severe allergic reaction (hives, swelling of the face and throat, difficulty breathing, a fast heartbeat, dizziness, or weakness), call 9-1-1 and get the person to the nearest hospital. For other signs that concern you, call your health care provider. Adverse reactions should be reported to the Vaccine Adverse Event Reporting System (VAERS). Your health care provider will usually file this report, or you can do it yourself. Visit the VAERS website at www.vaers.hhs.gov or call 1-800-822-7967.VAERS is only for reporting reactions, and VAERS staff do not give medical advice. 6. The National Vaccine Injury Compensation Program The National Vaccine Injury Compensation Program (VICP) is a federal program that was created to compensate people who may have been injured by certain vaccines. Visit the VICP website at www.hrsa.gov/vaccinecompensation or call 1-800-338-2382 to learn about the program and about filing a claim. There is a time limit to file a claim for compensation. 7. How can I learn more?  Ask your healthcare provider.  Call your local or state health department.  Contact the Centers for Disease Control and Prevention (CDC): ? Call 1-800-232-4636 (1-800-CDC-INFO) or ? Visit CDC's www.cdc.gov/flu Vaccine Information Statement (Interim) Inactivated Influenza Vaccine (08/19/2017) This information is not intended to replace advice given to you by your health care provider. Make sure you discuss any questions you have with your health care provider. Document Released: 10/16/2005 Document Revised: 04/12/2018 Document Reviewed: 08/23/2017 Elsevier Patient Education  2020 [employer]  

## 2018-09-14 NOTE — Progress Notes (Signed)
[place] OFFICE PROGRESS NOTE   Diagnosis: Colon cancer  INTERVAL HISTORY:   Cody Suarez returns as scheduled.  He completed another cycle of FOLFOX on 08/31/2018.  He reports cold sensitivity for 3 days following chemotherapy.  No other neuropathy symptoms.  Good appetite.  No nausea/vomiting or diarrhea.  No bleeding.  No abdominal pain after receiving Udenyca.  Objective:  Vital signs in last 24 hours:  Blood pressure (!) 156/75, pulse (!) 110, temperature 98.7 F (37.1 C), temperature source Temporal, resp. rate 19, height 5' 10"  (1.778 m), weight 136 lb 3.2 oz (61.8 kg), SpO2 100 %.   Limited physical examination secondary to distancing with the COVID pandemic HEENT: No thrush or ulcers GI: Nontender, no hepatomegaly, no mass Vascular: The right lower leg is larger than the left side, no edema  Skin: Palms without erythema  Portacath/PICC-without erythema  Lab Results:  Lab Results  Component Value Date   WBC 19.5 (H) 09/14/2018   HGB 10.3 (L) 09/14/2018   HCT 32.4 (L) 09/14/2018   MCV 82.2 09/14/2018   PLT 284 09/14/2018   NEUTROABS PENDING 09/14/2018    CMP  Lab Results  Component Value Date   NA 137 08/31/2018   K 3.9 08/31/2018   CL 107 08/31/2018   CO2 20 (L) 08/31/2018   GLUCOSE 99 08/31/2018   BUN 11 08/31/2018   CREATININE 0.91 08/31/2018   CALCIUM 9.0 08/31/2018   PROT 7.4 08/31/2018   ALBUMIN 3.3 (L) 08/31/2018   AST 15 08/31/2018   ALT 7 08/31/2018   ALKPHOS 80 08/31/2018   BILITOT 0.3 08/31/2018   GFRNONAA >60 08/31/2018   GFRAA >60 08/31/2018    Lab Results  Component Value Date   CEA1 2.80 08/17/2018    Medications: I have reviewed the patient's current medications.   Assessment/Plan: 1. 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  Negative for a BRAF mutation  Tumor invaded through the muscularis propria into pericolonic fatty tissue and involved the attached omentum.   Cycle 1 adjuvant Xeloda 05/28/2013.   Cycle 2 adjuvant Xeloda 06/18/2013.   Cycle 3 adjuvant Xeloda 07/09/2013.   Cycle 4 adjuvant Xeloda 07/30/2013.   Cycle 5 adjuvant Xeloda 08/20/2013.  Cycle 6 adjuvant Xeloda 09/11/2013.  cycle 7 adjuvant Xeloda 10/02/2013  Cycle 8 adjuvant Xeloda 10/23/2013  CTs of the chest, abdomen, and pelvis on 07/18/2014-negative for recurrent colon cancer   CTs abdomen/pelvis 09/06/2014 showed acute cholecystitis.  Colonoscopy 08/02/2015-chronic colitis, no malignancy  CTs 09/17/2015, new mass near the tail the pancreas, stable lung nodules  Biopsy mesenteric mass 10/04/2015 with metastatic adenocarcinoma consistent with colorectal primary  PET scan 10/10/2015 with soft tissue thickening within the peritoneal space of the left upper quadrant with SUV Max 7.9; no additional sites of peritoneal nodularity to suggest metastasis; hypermetabolic ill-defined nodule in the left lower lobe favored inflammatory.  Resection of the left abdominal mesenteric mass/tail the pancreas on 11/05/2015 with the pathology confirming metastatic colon cancer, positive "serosal" margin  CTs 07/29/2016-no evidence of metastatic disease, stable small lung nodules, decreased size of pancreatic resection bed fluid/mass  CTs 06/28/2017-decreased fluid and increased soft tissue at the area of the distal pancreatectomy with a few foci of gas  CT 09/29/2017-enlargement of left upper quadrant soft tissue mass between the stomach, spleen, and pancreas tail, stable nodules at the lung bases  CT biopsy  of the upper quadrant mass 10/19/2017-metastatic colon cancer, MSS, tumor mutation burden 1, no KRAS, BRAF, or NRAS mutation  CTs 12/24/2017-complex left upper quadrant process involving the stomach, spleen, tail the pancreas, and distal  transverse colon.  CT 04/27/2018-stable to mild progression of the left upper quadrant mass, compression of the stomach with no obstruction, no other evidence of metastatic disease, resolution of small fluid collection lateral to the left upper quadrant mass  CT 07/21/2018-enlargement of the left upper quadrant mass with involvement of the posterior stomach and spleen. Possible new right upper lung nodule  Cycle 1 FOLFOX 08/17/2018  Cycle 2 FOLFOX 08/31/2018, Udenyca added  Cycle 3 FOLFOX 09/14/2018, Udenyca 2. Ulcerative colitis-extensive chronic active ulcerative colitis was noted on the colon resection specimen 03/28/2013.  Followed by Dr.Magod 3. Hypertension.  4. History of microcytic anemia-likely iron deficiency, unable to tolerate oral iron. Improved. 5. Possible area of cecal wall thickening noted on abdominal CT 03/20/2013. 6. Family history of colon cancer (maternal grandfather died in his 34s with colon cancer). 7. Bilateral calf and low anterior leg pain. Bilateral lower extremity venous duplex negative for DVT 07/14/2013. Right ABI with moderate, borderline severe arterial insufficiency; left ABI suggestive of moderate arterial insufficiency. He was referred to vascular. 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 TEE showed thrombus in the descending aorta. He underwent right popliteal to peroneal artery bypass graft, thrombectomy anterior tibialis and attempted thrombectomy tibioperoneal trunk and posterior tibialis on 08/11/2013. Right calf pain 09/26/2013 with findings of occlusion of right popliteal to peroneal bypass status post thrombolysis. Now on anticoagulation with Xarelto.  recurrent pain and ulceration at the right foot, status post a popliteal to posterior tibial artery bypass using a cephalic vein graft on 85/27/7824  Xarelto discontinued due to high risk for bleeding and aspirin 81 mg daily initiated hospitalization July 2020 8. Status post  laparoscopic cholecystectomy 09/08/2014 9. Anorexia following the mesenteric mass/pancreas tail resection-improved 10. Abdominal abscess November 2017-pancreas "cysts "drainage catheter placed 11/26/2015; CT 02/04/2016 with no change to slight decreasein size of residual pancreatic tail fluid collection.Drainage catheter removed approximately early March 2018  CT 02/20/2016-new small left pleural effusion, increased size of the surgical bed fluid collection, indeterminate pulmonary nodules  CT 07/29/2016-decreased fluid/soft tissue density near the pancreas is no evidence of metastatic disease, stable subcentimeter lung nodules 11.Admission 12/24/2017 with an abdominal abscess, status post placement of a percutaneous drain and antibiotic therapy; CTs abdomen/pelvis 01/04/2018-near complete drainage of previously noted complex fluid collection in the left upper quadrant. 2 small residual collections remain. Residual soft tissue mass at the clips concerning for residual recurrent neoplasm. Peritoneal wall thickening also concerning for metastatic disease. Drain removed 01/04/2018  CT 01/18/2018-no change in soft tissue mass at the tail of pancreas with involvement of the gastric fundus and spleen, there is central low attenuation  Admission 01/20/2018 with fever and failure to thrive, no drainable fluid collection, placed on IV antibiotics, discharged to complete an outpatient course of IV ceftriaxone and Flagyl completed 02/20/2018  12.Admission 7/26/2020transient speech abnormality and microcytic anemia  13.  Port-A-Cath placement, Dr. Hassell Done, 08/15/2018    Disposition: Cody Suarez has completed 2 cycles of FOLFOX.  He has tolerated the chemotherapy well.  Performance status has improved.  The hemoglobin remains improved.  He will complete cycle 3 FOLFOX today.  He will return for an office visit and chemotherapy in 2 weeks.  Cody Suarez indicated his wishes to die in his home.  He would  like to be placed  on a no CODE BLUE status.  Betsy Coder, MD  09/14/2018  12:02 PM

## 2018-09-14 NOTE — Progress Notes (Signed)
Per Dr. Benay Spice: OK to treat w/pulse rate 110.

## 2018-09-16 ENCOUNTER — Other Ambulatory Visit: Payer: Self-pay

## 2018-09-16 ENCOUNTER — Inpatient Hospital Stay: Payer: Medicare Other

## 2018-09-16 VITALS — BP 118/72 | HR 82 | Temp 98.2°F | Resp 18

## 2018-09-16 DIAGNOSIS — Z5111 Encounter for antineoplastic chemotherapy: Secondary | ICD-10-CM | POA: Diagnosis not present

## 2018-09-16 DIAGNOSIS — C189 Malignant neoplasm of colon, unspecified: Secondary | ICD-10-CM

## 2018-09-16 DIAGNOSIS — C184 Malignant neoplasm of transverse colon: Secondary | ICD-10-CM | POA: Diagnosis not present

## 2018-09-16 DIAGNOSIS — C7889 Secondary malignant neoplasm of other digestive organs: Secondary | ICD-10-CM | POA: Diagnosis not present

## 2018-09-16 DIAGNOSIS — Z5189 Encounter for other specified aftercare: Secondary | ICD-10-CM | POA: Diagnosis not present

## 2018-09-16 DIAGNOSIS — Z23 Encounter for immunization: Secondary | ICD-10-CM | POA: Diagnosis not present

## 2018-09-16 MED ORDER — PEGFILGRASTIM-CBQV 6 MG/0.6ML ~~LOC~~ SOSY
6.0000 mg | PREFILLED_SYRINGE | Freq: Once | SUBCUTANEOUS | Status: AC
  Administered 2018-09-16: 6 mg via SUBCUTANEOUS

## 2018-09-16 MED ORDER — HEPARIN SOD (PORK) LOCK FLUSH 100 UNIT/ML IV SOLN
500.0000 [IU] | Freq: Once | INTRAVENOUS | Status: AC | PRN
  Administered 2018-09-16: 500 [IU]
  Filled 2018-09-16: qty 5

## 2018-09-16 MED ORDER — SODIUM CHLORIDE 0.9% FLUSH
10.0000 mL | INTRAVENOUS | Status: DC | PRN
  Administered 2018-09-16: 10 mL
  Filled 2018-09-16: qty 10

## 2018-09-16 MED ORDER — PEGFILGRASTIM-CBQV 6 MG/0.6ML ~~LOC~~ SOSY
PREFILLED_SYRINGE | SUBCUTANEOUS | Status: AC
  Filled 2018-09-16: qty 0.6

## 2018-09-20 ENCOUNTER — Telehealth: Payer: Self-pay | Admitting: *Deleted

## 2018-09-20 ENCOUNTER — Other Ambulatory Visit: Payer: Self-pay | Admitting: Cardiovascular Disease

## 2018-09-20 NOTE — Telephone Encounter (Signed)
Refill Request.  

## 2018-09-20 NOTE — Telephone Encounter (Addendum)
Called to report his mouth is very sore and his throat feels so bad it is difficult to swallow. Feels as if it is closing up when he is trying to swallow. Does not see any evidence of thrush or ulcers and has no heartburn or reflux symptoms. Also reports having excess mucous/saliva that can make him feel as if he is choking. Not sleeping well due to these symptoms. He confirmed he is not eating/drinking cold temperatures. Last tx 09/14/2018. He is using his MMW as ordered and taking Tylenol prn for the pain with minimal help. Suggested he continue MMW and try taking his Hydrocodone/apap instead. Also try baking soda rinses and gargles and Chloraseptic spray. Confirmed he is not taking a MVI. Per Dr. Benay Spice: Above interventions appropriate. Call if unable to take po's. Patient notified and reports he took a hydrocodone and it helped. He agrees to call if it worsens.

## 2018-09-25 ENCOUNTER — Other Ambulatory Visit: Payer: Self-pay | Admitting: Oncology

## 2018-09-28 ENCOUNTER — Other Ambulatory Visit: Payer: Medicare Other

## 2018-09-28 ENCOUNTER — Ambulatory Visit: Payer: Medicare Other | Admitting: Oncology

## 2018-09-28 ENCOUNTER — Ambulatory Visit: Payer: Medicare Other

## 2018-09-29 ENCOUNTER — Inpatient Hospital Stay: Payer: Medicare Other

## 2018-09-29 ENCOUNTER — Other Ambulatory Visit: Payer: Self-pay

## 2018-09-29 ENCOUNTER — Telehealth: Payer: Self-pay | Admitting: *Deleted

## 2018-09-29 ENCOUNTER — Inpatient Hospital Stay (HOSPITAL_BASED_OUTPATIENT_CLINIC_OR_DEPARTMENT_OTHER): Payer: Medicare Other | Admitting: Oncology

## 2018-09-29 VITALS — BP 165/69 | HR 115 | Temp 98.5°F | Resp 17 | Ht 70.0 in | Wt 130.3 lb

## 2018-09-29 VITALS — HR 100

## 2018-09-29 DIAGNOSIS — Z5111 Encounter for antineoplastic chemotherapy: Secondary | ICD-10-CM | POA: Diagnosis not present

## 2018-09-29 DIAGNOSIS — C7889 Secondary malignant neoplasm of other digestive organs: Secondary | ICD-10-CM | POA: Diagnosis not present

## 2018-09-29 DIAGNOSIS — Z23 Encounter for immunization: Secondary | ICD-10-CM | POA: Diagnosis not present

## 2018-09-29 DIAGNOSIS — C799 Secondary malignant neoplasm of unspecified site: Secondary | ICD-10-CM

## 2018-09-29 DIAGNOSIS — C184 Malignant neoplasm of transverse colon: Secondary | ICD-10-CM | POA: Diagnosis not present

## 2018-09-29 DIAGNOSIS — C189 Malignant neoplasm of colon, unspecified: Secondary | ICD-10-CM

## 2018-09-29 DIAGNOSIS — Z95828 Presence of other vascular implants and grafts: Secondary | ICD-10-CM

## 2018-09-29 DIAGNOSIS — Z5189 Encounter for other specified aftercare: Secondary | ICD-10-CM | POA: Diagnosis not present

## 2018-09-29 LAB — CMP (CANCER CENTER ONLY)
ALT: <6 U/L (ref 0–44)
AST: 19 U/L (ref 15–41)
Albumin: 3.1 g/dL — ABNORMAL LOW (ref 3.5–5.0)
Alkaline Phosphatase: 106 U/L (ref 38–126)
Anion gap: 11 (ref 5–15)
BUN: 8 mg/dL (ref 8–23)
CO2: 19 mmol/L — ABNORMAL LOW (ref 22–32)
Calcium: 8.9 mg/dL (ref 8.9–10.3)
Chloride: 104 mmol/L (ref 98–111)
Creatinine: 0.93 mg/dL (ref 0.61–1.24)
GFR, Est AFR Am: >60 mL/min (ref >60)
GFR, Estimated: >60 mL/min (ref >60)
Glucose, Bld: 160 mg/dL — ABNORMAL HIGH (ref 70–99)
Potassium: 2.9 mmol/L — CL (ref 3.5–5.1)
Sodium: 134 mmol/L — ABNORMAL LOW (ref 135–145)
Total Bilirubin: 0.3 mg/dL (ref 0.3–1.2)
Total Protein: 6.7 g/dL (ref 6.5–8.1)

## 2018-09-29 LAB — CBC WITH DIFFERENTIAL (CANCER CENTER ONLY)
Abs Immature Granulocytes: 0.56 10*3/uL — ABNORMAL HIGH (ref 0.00–0.07)
Basophils Absolute: 0.2 10*3/uL — ABNORMAL HIGH (ref 0.0–0.1)
Basophils Relative: 2 %
Eosinophils Absolute: 0.5 10*3/uL (ref 0.0–0.5)
Eosinophils Relative: 5 %
HCT: 29.1 % — ABNORMAL LOW (ref 39.0–52.0)
Hemoglobin: 9.6 g/dL — ABNORMAL LOW (ref 13.0–17.0)
Immature Granulocytes: 5 %
Lymphocytes Relative: 17 %
Lymphs Abs: 1.8 10*3/uL (ref 0.7–4.0)
MCH: 27.7 pg (ref 26.0–34.0)
MCHC: 33 g/dL (ref 30.0–36.0)
MCV: 83.9 fL (ref 80.0–100.0)
Monocytes Absolute: 1.7 10*3/uL — ABNORMAL HIGH (ref 0.1–1.0)
Monocytes Relative: 16 %
Neutro Abs: 5.9 10*3/uL (ref 1.7–7.7)
Neutrophils Relative %: 55 %
Platelet Count: 247 10*3/uL (ref 150–400)
RBC: 3.47 MIL/uL — ABNORMAL LOW (ref 4.22–5.81)
RDW: 30.4 % — ABNORMAL HIGH (ref 11.5–15.5)
WBC Count: 10.7 10*3/uL — ABNORMAL HIGH (ref 4.0–10.5)
nRBC: 0.4 % — ABNORMAL HIGH (ref 0.0–0.2)

## 2018-09-29 LAB — CEA (IN HOUSE-CHCC): CEA (CHCC-In House): 2.72 ng/mL (ref 0.00–5.00)

## 2018-09-29 MED ORDER — DEXTROSE 5 % IV SOLN
Freq: Once | INTRAVENOUS | Status: AC
  Administered 2018-09-29: 12:00:00 via INTRAVENOUS
  Filled 2018-09-29: qty 250

## 2018-09-29 MED ORDER — LEUCOVORIN CALCIUM INJECTION 350 MG
300.0000 mg/m2 | Freq: Once | INTRAVENOUS | Status: AC
  Administered 2018-09-29: 514 mg via INTRAVENOUS
  Filled 2018-09-29: qty 25.7

## 2018-09-29 MED ORDER — POTASSIUM CHLORIDE CRYS ER 20 MEQ PO TBCR: 20.0000 meq | EXTENDED_RELEASE_TABLET | Freq: Two times a day (BID) | ORAL | 1 refills | Status: DC

## 2018-09-29 MED ORDER — HEPARIN SOD (PORK) LOCK FLUSH 100 UNIT/ML IV SOLN
500.0000 [IU] | Freq: Once | INTRAVENOUS | Status: DC | PRN
  Filled 2018-09-29: qty 5

## 2018-09-29 MED ORDER — SODIUM CHLORIDE 0.9% FLUSH
10.0000 mL | Freq: Once | INTRAVENOUS | Status: AC
  Administered 2018-09-29: 11:00:00 10 mL
  Filled 2018-09-29: qty 10

## 2018-09-29 MED ORDER — OXALIPLATIN CHEMO INJECTION 100 MG/20ML
65.0000 mg/m2 | Freq: Once | INTRAVENOUS | Status: AC
  Administered 2018-09-29: 13:00:00 110 mg via INTRAVENOUS
  Filled 2018-09-29: qty 10

## 2018-09-29 MED ORDER — DEXAMETHASONE SODIUM PHOSPHATE 10 MG/ML IJ SOLN
INTRAMUSCULAR | Status: AC
  Filled 2018-09-29: qty 1

## 2018-09-29 MED ORDER — SODIUM CHLORIDE 0.9 % IV SOLN
1800.0000 mg/m2 | INTRAVENOUS | Status: DC
  Administered 2018-09-29: 15:00:00 3100 mg via INTRAVENOUS
  Filled 2018-09-29: qty 62

## 2018-09-29 MED ORDER — DEXTROSE 5 % IV SOLN
Freq: Once | INTRAVENOUS | Status: AC
  Administered 2018-09-29: 13:00:00 via INTRAVENOUS
  Filled 2018-09-29: qty 250

## 2018-09-29 MED ORDER — DEXAMETHASONE SODIUM PHOSPHATE 10 MG/ML IJ SOLN
10.0000 mg | Freq: Once | INTRAMUSCULAR | Status: AC
  Administered 2018-09-29: 12:00:00 10 mg via INTRAVENOUS

## 2018-09-29 MED ORDER — PALONOSETRON HCL INJECTION 0.25 MG/5ML
0.2500 mg | Freq: Once | INTRAVENOUS | Status: AC
  Administered 2018-09-29: 12:00:00 0.25 mg via INTRAVENOUS

## 2018-09-29 MED ORDER — PALONOSETRON HCL INJECTION 0.25 MG/5ML
INTRAVENOUS | Status: AC
  Filled 2018-09-29: qty 5

## 2018-09-29 MED ORDER — SODIUM CHLORIDE 0.9% FLUSH
10.0000 mL | INTRAVENOUS | Status: DC | PRN
  Filled 2018-09-29: qty 10

## 2018-09-29 NOTE — Patient Instructions (Signed)
[facility] Discharge Instructions for Patients Receiving Chemotherapy  Today you received the following chemotherapy agents: Oxaliplatin, Leucovorin, Fluorouracil injection and pump   To help prevent nausea and vomiting after your treatment, we encourage you to take your nausea medication as directed.    If you develop nausea and vomiting that is not controlled by your nausea medication, call the clinic.   BELOW ARE SYMPTOMS THAT SHOULD BE REPORTED IMMEDIATELY:  *FEVER GREATER THAN 100.5 F  *CHILLS WITH OR WITHOUT FEVER  NAUSEA AND VOMITING THAT IS NOT CONTROLLED WITH YOUR NAUSEA MEDICATION  *UNUSUAL SHORTNESS OF BREATH  *UNUSUAL BRUISING OR BLEEDING  TENDERNESS IN MOUTH AND THROAT WITH OR WITHOUT PRESENCE OF ULCERS  *URINARY PROBLEMS  *BOWEL PROBLEMS  UNUSUAL RASH Items with * indicate a potential emergency and should be followed up as soon as possible.  Feel free to call the clinic should you have any questions or concerns. The clinic phone number is [phone].  Please show the CHEMO ALERT CARD at check-in to the Emergency Department and triage nurse.    Leucovorin injection What is this medicine? LEUCOVORIN (loo koe VOR in) is used to prevent or treat the harmful effects of some medicines. This medicine is used to treat anemia caused by a low amount of folic acid in the body. It is also used with 5-fluorouracil (5-FU) to treat colon cancer. This medicine may be used for other purposes; ask your health care provider or pharmacist if you have questions. What should I tell my health care provider before I take this medicine? They need to know if you have any of these conditions:  anemia from low levels of vitamin B-12 in the blood  an unusual or allergic reaction to leucovorin, folic acid, other medicines, foods, dyes, or preservatives  pregnant or trying to get pregnant  breast-feeding How should I use this medicine? This medicine is for  injection into a muscle or into a vein. It is given by a health care professional in a hospital or clinic setting. Talk to your pediatrician regarding the use of this medicine in children. Special care may be needed. Overdosage: If you think you have taken too much of this medicine contact a poison control center or emergency room at once. NOTE: This medicine is only for you. Do not share this medicine with others. What if I miss a dose? This does not apply. What may interact with this medicine?  capecitabine  fluorouracil  phenobarbital  phenytoin  primidone  trimethoprim-sulfamethoxazole This list may not describe all possible interactions. Give your health care provider a list of all the medicines, herbs, non-prescription drugs, or dietary supplements you use. Also tell them if you smoke, drink alcohol, or use illegal drugs. Some items may interact with your medicine. What should I watch for while using this medicine? Your condition will be monitored carefully while you are receiving this medicine. This medicine may increase the side effects of 5-fluorouracil, 5-FU. Tell your doctor or health care professional if you have diarrhea or mouth sores that do not get better or that get worse. What side effects may I notice from receiving this medicine? Side effects that you should report to your doctor or health care professional as soon as possible:  allergic reactions like skin rash, itching or hives, swelling of the face, lips, or tongue  breathing problems  fever, infection  mouth sores  unusual bleeding or bruising  unusually weak or tired Side effects that usually do not require   medical attention (report to your doctor or health care professional if they continue or are bothersome):  constipation or diarrhea  loss of appetite  nausea, vomiting This list may not describe all possible side effects. Call your doctor for medical advice about side effects. You may report  side effects to FDA at 1-800-FDA-1088. Where should I keep my medicine? This drug is given in a hospital or clinic and will not be stored at home. NOTE: This sheet is a summary. It may not cover all possible information. If you have questions about this medicine, talk to your doctor, pharmacist, or health care provider.  2020 Elsevier/Gold Standard (2007-06-28 16:50:29) Fluorouracil, 5-FU injection What is this medicine? FLUOROURACIL, 5-FU (flure oh YOOR a sil) is a chemotherapy drug. It slows the growth of cancer cells. This medicine is used to treat many types of cancer like breast cancer, colon or rectal cancer, pancreatic cancer, and stomach cancer. This medicine may be used for other purposes; ask your health care provider or pharmacist if you have questions. COMMON BRAND NAME(S): Adrucil What should I tell my health care provider before I take this medicine? They need to know if you have any of these conditions:  blood disorders  dihydropyrimidine dehydrogenase (DPD) deficiency  infection (especially a virus infection such as chickenpox, cold sores, or herpes)  kidney disease  liver disease  malnourished, poor nutrition  recent or ongoing radiation therapy  an unusual or allergic reaction to fluorouracil, other chemotherapy, other medicines, foods, dyes, or preservatives  pregnant or trying to get pregnant  breast-feeding How should I use this medicine? This drug is given as an infusion or injection into a vein. It is administered in a hospital or clinic by a specially trained health care professional. Talk to your pediatrician regarding the use of this medicine in children. Special care may be needed. Overdosage: If you think you have taken too much of this medicine contact a poison control center or emergency room at once. NOTE: This medicine is only for you. Do not share this medicine with others. What if I miss a dose? It is important not to miss your dose. Call your  doctor or health care professional if you are unable to keep an appointment. What may interact with this medicine?  allopurinol  cimetidine  dapsone  digoxin  hydroxyurea  leucovorin  levamisole  medicines for seizures like ethotoin, fosphenytoin, phenytoin  medicines to increase blood counts like filgrastim, pegfilgrastim, sargramostim  medicines that treat or prevent blood clots like warfarin, enoxaparin, and dalteparin  methotrexate  metronidazole  pyrimethamine  some other chemotherapy drugs like busulfan, cisplatin, estramustine, vinblastine  trimethoprim  trimetrexate  vaccines Talk to your doctor or health care professional before taking any of these medicines:  acetaminophen  aspirin  ibuprofen  ketoprofen  naproxen This list may not describe all possible interactions. Give your health care provider a list of all the medicines, herbs, non-prescription drugs, or dietary supplements you use. Also tell them if you smoke, drink alcohol, or use illegal drugs. Some items may interact with your medicine. What should I watch for while using this medicine? Visit your doctor for checks on your progress. This drug may make you feel generally unwell. This is not uncommon, as chemotherapy can affect healthy cells as well as cancer cells. Report any side effects. Continue your course of treatment even though you feel ill unless your doctor tells you to stop. In some cases, you may be given additional medicines to help with   side effects. Follow all directions for their use. Call your doctor or health care professional for advice if you get a fever, chills or sore throat, or other symptoms of a cold or flu. Do not treat yourself. This drug decreases your body's ability to fight infections. Try to avoid being around people who are sick. This medicine may increase your risk to bruise or bleed. Call your doctor or health care professional if you notice any unusual  bleeding. Be careful brushing and flossing your teeth or using a toothpick because you may get an infection or bleed more easily. If you have any dental work done, tell your dentist you are receiving this medicine. Avoid taking products that contain aspirin, acetaminophen, ibuprofen, naproxen, or ketoprofen unless instructed by your doctor. These medicines may hide a fever. Do not become pregnant while taking this medicine. Women should inform their doctor if they wish to become pregnant or think they might be pregnant. There is a potential for serious side effects to an unborn child. Talk to your health care professional or pharmacist for more information. Do not breast-feed an infant while taking this medicine. Men should inform their doctor if they wish to father a child. This medicine may lower sperm counts. Do not treat diarrhea with over the counter products. Contact your doctor if you have diarrhea that lasts more than 2 days or if it is severe and watery. This medicine can make you more sensitive to the sun. Keep out of the sun. If you cannot avoid being in the sun, wear protective clothing and use sunscreen. Do not use sun lamps or tanning beds/booths. What side effects may I notice from receiving this medicine? Side effects that you should report to your doctor or health care professional as soon as possible:  allergic reactions like skin rash, itching or hives, swelling of the face, lips, or tongue  low blood counts - this medicine may decrease the number of white blood cells, red blood cells and platelets. You may be at increased risk for infections and bleeding.  signs of infection - fever or chills, cough, sore throat, pain or difficulty passing urine  signs of decreased platelets or bleeding - bruising, pinpoint red spots on the skin, black, tarry stools, blood in the urine  signs of decreased red blood cells - unusually weak or tired, fainting spells, lightheadedness  breathing  problems  changes in vision  chest pain  mouth sores  nausea and vomiting  pain, swelling, redness at site where injected  pain, tingling, numbness in the hands or feet  redness, swelling, or sores on hands or feet  stomach pain  unusual bleeding Side effects that usually do not require medical attention (report to your doctor or health care professional if they continue or are bothersome):  changes in finger or toe nails  diarrhea  dry or itchy skin  hair loss  headache  loss of appetite  sensitivity of eyes to the light  stomach upset  unusually teary eyes This list may not describe all possible side effects. Call your doctor for medical advice about side effects. You may report side effects to FDA at 1-800-FDA-1088. Where should I keep my medicine? This drug is given in a hospital or clinic and will not be stored at home. NOTE: This sheet is a summary. It may not cover all possible information. If you have questions about this medicine, talk to your doctor, pharmacist, or health care provider.  2020 Elsevier/Gold Standard (2007-04-27 13:53:16) Oxaliplatin   Injection What is this medicine? OXALIPLATIN (ox AL i PLA tin) is a chemotherapy drug. It targets fast dividing cells, like cancer cells, and causes these cells to die. This medicine is used to treat cancers of the colon and rectum, and many other cancers. This medicine may be used for other purposes; ask your health care provider or pharmacist if you have questions. COMMON BRAND NAME(S): Eloxatin What should I tell my health care provider before I take this medicine? They need to know if you have any of these conditions:  kidney disease  an unusual or allergic reaction to oxaliplatin, other chemotherapy, other medicines, foods, dyes, or preservatives  pregnant or trying to get pregnant  breast-feeding How should I use this medicine? This drug is given as an infusion into a vein. It is administered in a  hospital or clinic by a specially trained health care professional. Talk to your pediatrician regarding the use of this medicine in children. Special care may be needed. Overdosage: If you think you have taken too much of this medicine contact a poison control center or emergency room at once. NOTE: This medicine is only for you. Do not share this medicine with others. What if I miss a dose? It is important not to miss a dose. Call your doctor or health care professional if you are unable to keep an appointment. What may interact with this medicine?  medicines to increase blood counts like filgrastim, pegfilgrastim, sargramostim  probenecid  some antibiotics like amikacin, gentamicin, neomycin, polymyxin B, streptomycin, tobramycin  zalcitabine Talk to your doctor or health care professional before taking any of these medicines:  acetaminophen  aspirin  ibuprofen  ketoprofen  naproxen This list may not describe all possible interactions. Give your health care provider a list of all the medicines, herbs, non-prescription drugs, or dietary supplements you use. Also tell them if you smoke, drink alcohol, or use illegal drugs. Some items may interact with your medicine. What should I watch for while using this medicine? Your condition will be monitored carefully while you are receiving this medicine. You will need important blood work done while you are taking this medicine. This medicine can make you more sensitive to cold. Do not drink cold drinks or use ice. Cover exposed skin before coming in contact with cold temperatures or cold objects. When out in cold weather wear warm clothing and cover your mouth and nose to warm the air that goes into your lungs. Tell your doctor if you get sensitive to the cold. This drug may make you feel generally unwell. This is not uncommon, as chemotherapy can affect healthy cells as well as cancer cells. Report any side effects. Continue your course of  treatment even though you feel ill unless your doctor tells you to stop. In some cases, you may be given additional medicines to help with side effects. Follow all directions for their use. Call your doctor or health care professional for advice if you get a fever, chills or sore throat, or other symptoms of a cold or flu. Do not treat yourself. This drug decreases your body's ability to fight infections. Try to avoid being around people who are sick. This medicine may increase your risk to bruise or bleed. Call your doctor or health care professional if you notice any unusual bleeding. Be careful brushing and flossing your teeth or using a toothpick because you may get an infection or bleed more easily. If you have any dental work done, tell your dentist you   are receiving this medicine. Avoid taking products that contain aspirin, acetaminophen, ibuprofen, naproxen, or ketoprofen unless instructed by your doctor. These medicines may hide a fever. Do not become pregnant while taking this medicine. Women should inform their doctor if they wish to become pregnant or think they might be pregnant. There is a potential for serious side effects to an unborn child. Talk to your health care professional or pharmacist for more information. Do not breast-feed an infant while taking this medicine. Call your doctor or health care professional if you get diarrhea. Do not treat yourself. What side effects may I notice from receiving this medicine? Side effects that you should report to your doctor or health care professional as soon as possible:  allergic reactions like skin rash, itching or hives, swelling of the face, lips, or tongue  low blood counts - This drug may decrease the number of white blood cells, red blood cells and platelets. You may be at increased risk for infections and bleeding.  signs of infection - fever or chills, cough, sore throat, pain or difficulty passing urine  signs of decreased  platelets or bleeding - bruising, pinpoint red spots on the skin, black, tarry stools, nosebleeds  signs of decreased red blood cells - unusually weak or tired, fainting spells, lightheadedness  breathing problems  chest pain, pressure  cough  diarrhea  jaw tightness  mouth sores  nausea and vomiting  pain, swelling, redness or irritation at the injection site  pain, tingling, numbness in the hands or feet  problems with balance, talking, walking  redness, blistering, peeling or loosening of the skin, including inside the mouth  trouble passing urine or change in the amount of urine Side effects that usually do not require medical attention (report to your doctor or health care professional if they continue or are bothersome):  changes in vision  constipation  hair loss  loss of appetite  metallic taste in the mouth or changes in taste  stomach pain This list may not describe all possible side effects. Call your doctor for medical advice about side effects. You may report side effects to FDA at 1-800-FDA-1088. Where should I keep my medicine? This drug is given in a hospital or clinic and will not be stored at home. NOTE: This sheet is a summary. It may not cover all possible information. If you have questions about this medicine, talk to your doctor, pharmacist, or health care provider.  2020 Elsevier/Gold Standard (2007-07-19 17:22:47) [facility] Discharge Instructions for Patients receiving Home Portable Chemo Pump    **The bag should finish at 46 hours, 96 hours or 7 days. For example, if your pump is scheduled for 46 hours and it was put on at 4pm, it should finish at 2 pm the day it is scheduled to come off regardless of your appointment time.    Estimated time to finish   _________________________ (Have your nurse fill in)     ** if the display on your pump reads "Low Volume" and it is beeping, take the batteries out of the pump and come to  the cancer center for it to be taken off.   **If the pump alarms go off prior to the pump reading "Low Volume" then call the 1-800-315-3287 and someone can assist you.  **If the plunger comes out and the bag fluid is running out, please use your chemo spill kit to clean up the spill. Do not use paper towels or other house hold products.  **   If you have problems or questions regarding your pump, please call either the 1-800-315-3287 or the cancer center Monday-Friday 8:00am-4:30pm at [phone] and we will assist you.  If you are unable to get assistance then go to [facility] Emergency Room, ask the staff to contact the IV team for assistance.     Influenza (Flu) Vaccine (Inactivated or Recombinant): What You Need to Know 1. Why get vaccinated? Influenza vaccine can prevent influenza (flu). Flu is a contagious disease that spreads around the [place] every year, usually between October and May. Anyone can get the flu, but it is more dangerous for some people. Infants and young children, people 65 years of age and older, pregnant women, and people with certain health conditions or a weakened immune system are at greatest risk of flu complications. Pneumonia, bronchitis, sinus infections and ear infections are examples of flu-related complications. If you have a medical condition, such as heart disease, cancer or diabetes, flu can make it worse. Flu can cause fever and chills, sore throat, muscle aches, fatigue, cough, headache, and runny or stuffy nose. Some people may have vomiting and diarrhea, though this is more common in children than adults. Each year thousands of people in the [place] die from flu, and many more are hospitalized. Flu vaccine prevents millions of illnesses and flu-related visits to the doctor each year. 2. Influenza vaccine CDC recommends everyone 6 months of age and older get vaccinated every flu season. Children 6 months through 8 years of age may need 2  doses during a single flu season. Everyone else needs only 1 dose each flu season. It takes about 2 weeks for protection to develop after vaccination. There are many flu viruses, and they are always changing. Each year a new flu vaccine is made to protect against three or four viruses that are likely to cause disease in the upcoming flu season. Even when the vaccine doesn't exactly match these viruses, it may still provide some protection. Influenza vaccine does not cause flu. Influenza vaccine may be given at the same time as other vaccines. 3. Talk with your health care provider Tell your vaccine provider if the person getting the vaccine:  Has had an allergic reaction after a previous dose of influenza vaccine, or has any severe, life-threatening allergies.  Has ever had Guillain-Barr Syndrome (also called GBS). In some cases, your health care provider may decide to postpone influenza vaccination to a future visit. People with minor illnesses, such as a cold, may be vaccinated. People who are moderately or severely ill should usually wait until they recover before getting influenza vaccine. Your health care provider can give you more information. 4. Risks of a vaccine reaction  Soreness, redness, and swelling where shot is given, fever, muscle aches, and headache can happen after influenza vaccine.  There may be a very small increased risk of Guillain-Barr Syndrome (GBS) after inactivated influenza vaccine (the flu shot). Young children who get the flu shot along with pneumococcal vaccine (PCV13), and/or DTaP vaccine at the same time might be slightly more likely to have a seizure caused by fever. Tell your health care provider if a child who is getting flu vaccine has ever had a seizure. People sometimes faint after medical procedures, including vaccination. Tell your provider if you feel dizzy or have vision changes or ringing in the ears. As with any medicine, there is a very remote  chance of a vaccine causing a severe allergic reaction, other serious injury, or death.   5. What if there is a serious problem? An allergic reaction could occur after the vaccinated person leaves the clinic. If you see signs of a severe allergic reaction (hives, swelling of the face and throat, difficulty breathing, a fast heartbeat, dizziness, or weakness), call 9-1-1 and get the person to the nearest hospital. For other signs that concern you, call your health care provider. Adverse reactions should be reported to the Vaccine Adverse Event Reporting System (VAERS). Your health care provider will usually file this report, or you can do it yourself. Visit the VAERS website at www.vaers.hhs.gov or call 1-800-822-7967.VAERS is only for reporting reactions, and VAERS staff do not give medical advice. 6. The National Vaccine Injury Compensation Program The National Vaccine Injury Compensation Program (VICP) is a federal program that was created to compensate people who may have been injured by certain vaccines. Visit the VICP website at www.hrsa.gov/vaccinecompensation or call 1-800-338-2382 to learn about the program and about filing a claim. There is a time limit to file a claim for compensation. 7. How can I learn more?  Ask your healthcare provider.  Call your local or state health department.  Contact the Centers for Disease Control and Prevention (CDC): ? Call 1-800-232-4636 (1-800-CDC-INFO) or ? Visit CDC's www.cdc.gov/flu Vaccine Information Statement (Interim) Inactivated Influenza Vaccine (08/19/2017) This information is not intended to replace advice given to you by your health care provider. Make sure you discuss any questions you have with your health care provider. Document Released: 10/16/2005 Document Revised: 04/12/2018 Document Reviewed: 08/23/2017 Elsevier Patient Education  2020 Elsevier Inc.  

## 2018-09-29 NOTE — Progress Notes (Signed)
Per Dr. Benay Spice: OK to treat w/K+ 2.9. Will call in K+ for home administration. OK to treat with pulse rate 115. Please recheck in infusion area and notify MD if higher

## 2018-09-29 NOTE — Progress Notes (Signed)
[place] OFFICE PROGRESS NOTE   Diagnosis: Colon cancer  INTERVAL HISTORY:   Cody Suarez completed another cycle of FOLFOX on 09/14/2018.  He denies nausea/vomiting, mouth sores, diarrhea, and neuropathy symptoms.  He complains of malaise.  He has exertional dyspnea.  No cough or fever.  He has decided to discontinue chemotherapy after this cycle.  Objective:  Vital signs in last 24 hours:  Blood pressure (!) 165/69, pulse (!) 115, temperature 98.5 F (36.9 C), temperature source Oral, resp. rate 17, height 5' 10"  (1.778 m), weight 130 lb 4.8 oz (59.1 kg), SpO2 100 %.    HEENT: No thrush or ulcers Resp: Lungs clear bilaterally Cardio: Regular rate and rhythm GI: No hepatomegaly, nontender, no mass Vascular: No leg edema  Skin: Palms without erythema  Portacath/PICC-without erythema  Lab Results:  Lab Results  Component Value Date   WBC 10.7 (H) 09/29/2018   HGB 9.6 (L) 09/29/2018   HCT 29.1 (L) 09/29/2018   MCV 83.9 09/29/2018   PLT 247 09/29/2018   NEUTROABS 5.9 09/29/2018    CMP  Lab Results  Component Value Date   NA 135 09/14/2018   K 3.3 (L) 09/14/2018   CL 106 09/14/2018   CO2 17 (L) 09/14/2018   GLUCOSE 111 (H) 09/14/2018   BUN 9 09/14/2018   CREATININE 0.99 09/14/2018   CALCIUM 8.9 09/14/2018   PROT 7.0 09/14/2018   ALBUMIN 3.2 (L) 09/14/2018   AST 18 09/14/2018   ALT <6 09/14/2018   ALKPHOS 114 09/14/2018   BILITOT 0.3 09/14/2018   GFRNONAA >60 09/14/2018   GFRAA >60 09/14/2018    Lab Results  Component Value Date   CEA1 2.80 08/17/2018     Medications: I have reviewed the patient's current medications.   Assessment/Plan: 1. 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 Negative for a BRAF mutation  Tumor invaded through the muscularis propria  into pericolonic fatty tissue and involved the attached omentum.   Cycle 1 adjuvant Xeloda 05/28/2013.   Cycle 2 adjuvant Xeloda 06/18/2013.   Cycle 3 adjuvant Xeloda 07/09/2013.   Cycle 4 adjuvant Xeloda 07/30/2013.   Cycle 5 adjuvant Xeloda 08/20/2013.  Cycle 6 adjuvant Xeloda 09/11/2013.  cycle 7 adjuvant Xeloda 10/02/2013  Cycle 8 adjuvant Xeloda 10/23/2013  CTs of the chest, abdomen, and pelvis on 07/18/2014-negative for recurrent colon cancer   CTs abdomen/pelvis 09/06/2014 showed acute cholecystitis.  Colonoscopy 08/02/2015-chronic colitis, no malignancy  CTs 09/17/2015, new mass near the tail the pancreas, stable lung nodules  Biopsy mesenteric mass 10/04/2015 with metastatic adenocarcinoma consistent with colorectal primary  PET scan 10/10/2015 with soft tissue thickening within the peritoneal space of the left upper quadrant with SUV Max 7.9; no additional sites of peritoneal nodularity to suggest metastasis; hypermetabolic ill-defined nodule in the left lower lobe favored inflammatory.  Resection of the left abdominal mesenteric mass/tail the pancreas on 11/05/2015 with the pathology confirming metastatic colon cancer, positive "serosal" margin  CTs 07/29/2016-no evidence of metastatic disease, stable small lung nodules, decreased size of pancreatic resection bed fluid/mass  CTs 06/28/2017-decreased fluid and increased soft tissue at the area of the distal pancreatectomy with a few foci of gas  CT 09/29/2017-enlargement of left upper quadrant soft tissue mass between the stomach, spleen, and pancreas tail, stable nodules at the lung bases  CT biopsy of the upper quadrant mass 10/19/2017-metastatic colon cancer, MSS,  tumor mutation burden 1, no KRAS, BRAF, or NRAS mutation  CTs 12/24/2017-complex left upper quadrant process involving the stomach, spleen, tail the pancreas, and distal transverse colon.  CT 04/27/2018-stable to mild progression of the left  upper quadrant mass, compression of the stomach with no obstruction, no other evidence of metastatic disease, resolution of small fluid collection lateral to the left upper quadrant mass  CT 07/21/2018-enlargement of the left upper quadrant mass with involvement of the posterior stomach and spleen. Possible new right upper lung nodule  Cycle 1 FOLFOX 08/17/2018  Cycle 2 FOLFOX 08/31/2018, Udenyca added  Cycle 3 FOLFOX 09/14/2018, Udenyca  Cycle 4 FOLFOX 09/29/2018, oxaliplatin and 5-fluorouracil dose reduced 5-FU bolus held 2. Ulcerative colitis-extensive chronic active ulcerative colitis was noted on the colon resection specimen 03/28/2013.  Followed by Dr.Magod 3. Hypertension.  4. History of microcytic anemia-likely iron deficiency, unable to tolerate oral iron. Improved. 5. Possible area of cecal wall thickening noted on abdominal CT 03/20/2013. 6. Family history of colon cancer (maternal grandfather died in his 41s with colon cancer). 7. Bilateral calf and low anterior leg pain. Bilateral lower extremity venous duplex negative for DVT 07/14/2013. Right ABI with moderate, borderline severe arterial insufficiency; left ABI suggestive of moderate arterial insufficiency. He was referred to vascular. 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 TEE showed thrombus in the descending aorta. He underwent right popliteal to peroneal artery bypass graft, thrombectomy anterior tibialis and attempted thrombectomy tibioperoneal trunk and posterior tibialis on 08/11/2013. Right calf pain 09/26/2013 with findings of occlusion of right popliteal to peroneal bypass status post thrombolysis. Now on anticoagulation with Xarelto.  recurrent pain and ulceration at the right foot, status post a popliteal to posterior tibial artery bypass using a cephalic vein graft on 27/74/1287  Xarelto discontinued due to high risk for bleeding and aspirin 81 mg daily initiated hospitalization July 2020 8.  Status post laparoscopic cholecystectomy 09/08/2014 9. Anorexia following the mesenteric mass/pancreas tail resection-improved 10. Abdominal abscess November 2017-pancreas "cysts "drainage catheter placed 11/26/2015; CT 02/04/2016 with no change to slight decreasein size of residual pancreatic tail fluid collection.Drainage catheter removed approximately early March 2018  CT 02/20/2016-new small left pleural effusion, increased size of the surgical bed fluid collection, indeterminate pulmonary nodules  CT 07/29/2016-decreased fluid/soft tissue density near the pancreas is no evidence of metastatic disease, stable subcentimeter lung nodules 11.Admission 12/24/2017 with an abdominal abscess, status post placement of a percutaneous drain and antibiotic therapy; CTs abdomen/pelvis 01/04/2018-near complete drainage of previously noted complex fluid collection in the left upper quadrant. 2 small residual collections remain. Residual soft tissue mass at the clips concerning for residual recurrent neoplasm. Peritoneal wall thickening also concerning for metastatic disease. Drain removed 01/04/2018  CT 01/18/2018-no change in soft tissue mass at the tail of pancreas with involvement of the gastric fundus and spleen, there is central low attenuation  Admission 01/20/2018 with fever and failure to thrive, no drainable fluid collection, placed on IV antibiotics, discharged to complete an outpatient course of IV ceftriaxone and Flagyl completed 02/20/2018  12.Admission 7/26/2020transient speech abnormality and microcytic anemia  13.  Port-A-Cath placement, Dr. Hassell Done, 08/15/2018     Disposition: Cody Suarez has completed 3 cycles of FOLFOX.  He complains of increased malaise.  He would like to proceed with chemotherapy today, but plans to discontinue chemotherapy after this cycle.  He agrees to restaging CT evaluation and office visit in 2 weeks.  I dose reduced the 5-FU and oxaliplatin.  The 5-FU  bolus will be  eliminated from the regimen today.  He will return for an office visit in approximately 2 weeks.  The hemoglobin is stable.  I doubt his symptoms are related to anemia.  Betsy Coder, MD  09/29/2018  11:33 AM

## 2018-09-29 NOTE — Telephone Encounter (Addendum)
CRITICAL VALUE STICKER  CRITICAL VALUE: K+ = 2.9  RECEIVER (on-site recipient of call): Cherylynn Ridges RN, Triage [place] NOTIFIED: 09/29/2018 at 1153.   MESSENGER (representative from lab): Kushani MT [facility]  MD NOTIFIED: Collaborative for Dr. Benay Spice.  TIME OF NOTIFICATION: 09/29/2018 at 1155.  RESPONSE: None

## 2018-09-30 ENCOUNTER — Telehealth: Payer: Self-pay | Admitting: Oncology

## 2018-09-30 NOTE — Telephone Encounter (Signed)
Called and spoke with patient. Confirmed appt  °

## 2018-10-01 ENCOUNTER — Inpatient Hospital Stay: Payer: Medicare Other

## 2018-10-01 ENCOUNTER — Other Ambulatory Visit: Payer: Self-pay

## 2018-10-01 VITALS — BP 137/66 | HR 109 | Temp 98.7°F | Resp 18

## 2018-10-01 DIAGNOSIS — Z23 Encounter for immunization: Secondary | ICD-10-CM | POA: Diagnosis not present

## 2018-10-01 DIAGNOSIS — C184 Malignant neoplasm of transverse colon: Secondary | ICD-10-CM | POA: Diagnosis not present

## 2018-10-01 DIAGNOSIS — C189 Malignant neoplasm of colon, unspecified: Secondary | ICD-10-CM

## 2018-10-01 DIAGNOSIS — C7889 Secondary malignant neoplasm of other digestive organs: Secondary | ICD-10-CM | POA: Diagnosis not present

## 2018-10-01 DIAGNOSIS — Z5111 Encounter for antineoplastic chemotherapy: Secondary | ICD-10-CM | POA: Diagnosis not present

## 2018-10-01 DIAGNOSIS — Z5189 Encounter for other specified aftercare: Secondary | ICD-10-CM | POA: Diagnosis not present

## 2018-10-01 MED ORDER — HEPARIN SOD (PORK) LOCK FLUSH 100 UNIT/ML IV SOLN
500.0000 [IU] | Freq: Once | INTRAVENOUS | Status: AC | PRN
  Administered 2018-10-01: 500 [IU]
  Filled 2018-10-01: qty 5

## 2018-10-01 MED ORDER — PEGFILGRASTIM-CBQV 6 MG/0.6ML ~~LOC~~ SOSY
6.0000 mg | PREFILLED_SYRINGE | Freq: Once | SUBCUTANEOUS | Status: AC
  Administered 2018-10-01: 6 mg via SUBCUTANEOUS

## 2018-10-01 MED ORDER — SODIUM CHLORIDE 0.9% FLUSH
10.0000 mL | INTRAVENOUS | Status: DC | PRN
  Administered 2018-10-01: 13:00:00 10 mL
  Filled 2018-10-01: qty 10

## 2018-10-01 NOTE — Patient Instructions (Signed)

## 2018-10-01 NOTE — Progress Notes (Signed)
Patient presented to [facility] for his pump d/c appointment with infusion tubing broken and in 2 separate pieces. Pt reports noticing the broken tubing this AM when he woke up. The patient reports that he immediately stopped the pumped and clamped either side of the tube. He reports that both he and his wife could find no fluid on the bed or on his clothing. Pump reads that a volume remaining of 19.45m and 130.761mgiven. Port-a-cath CDI (see flowsheets). [facility].

## 2018-10-12 ENCOUNTER — Ambulatory Visit (HOSPITAL_COMMUNITY)
Admission: RE | Admit: 2018-10-12 | Discharge: 2018-10-12 | Disposition: A | Discharge status: Home / Self Care | Payer: Medicare Other | Source: Ambulatory Visit | Attending: Oncology | Admitting: Oncology

## 2018-10-12 ENCOUNTER — Inpatient Hospital Stay: Payer: Medicare Other

## 2018-10-12 ENCOUNTER — Inpatient Hospital Stay: Payer: Medicare Other | Attending: Oncology

## 2018-10-12 ENCOUNTER — Other Ambulatory Visit: Payer: Self-pay

## 2018-10-12 DIAGNOSIS — C7889 Secondary malignant neoplasm of other digestive organs: Secondary | ICD-10-CM | POA: Diagnosis not present

## 2018-10-12 DIAGNOSIS — C189 Malignant neoplasm of colon, unspecified: Secondary | ICD-10-CM | POA: Diagnosis not present

## 2018-10-12 DIAGNOSIS — R630 Anorexia: Secondary | ICD-10-CM | POA: Insufficient documentation

## 2018-10-12 DIAGNOSIS — I10 Essential (primary) hypertension: Secondary | ICD-10-CM | POA: Insufficient documentation

## 2018-10-12 DIAGNOSIS — R439 Unspecified disturbances of smell and taste: Secondary | ICD-10-CM | POA: Insufficient documentation

## 2018-10-12 DIAGNOSIS — C259 Malignant neoplasm of pancreas, unspecified: Secondary | ICD-10-CM | POA: Diagnosis not present

## 2018-10-12 DIAGNOSIS — C799 Secondary malignant neoplasm of unspecified site: Secondary | ICD-10-CM

## 2018-10-12 DIAGNOSIS — Z95828 Presence of other vascular implants and grafts: Secondary | ICD-10-CM

## 2018-10-12 DIAGNOSIS — K6389 Other specified diseases of intestine: Secondary | ICD-10-CM | POA: Insufficient documentation

## 2018-10-12 DIAGNOSIS — C184 Malignant neoplasm of transverse colon: Secondary | ICD-10-CM | POA: Diagnosis not present

## 2018-10-12 LAB — CBC WITH DIFFERENTIAL (CANCER CENTER ONLY)
Abs Immature Granulocytes: 1.39 10*3/uL — ABNORMAL HIGH (ref 0.00–0.07)
Basophils Absolute: 0.4 10*3/uL — ABNORMAL HIGH (ref 0.0–0.1)
Basophils Relative: 2 %
Eosinophils Absolute: 0.2 10*3/uL (ref 0.0–0.5)
Eosinophils Relative: 1 %
HCT: 30.8 % — ABNORMAL LOW (ref 39.0–52.0)
Hemoglobin: 9.8 g/dL — ABNORMAL LOW (ref 13.0–17.0)
Immature Granulocytes: 6 %
Lymphocytes Relative: 7 %
Lymphs Abs: 1.8 10*3/uL (ref 0.7–4.0)
MCH: 28.7 pg (ref 26.0–34.0)
MCHC: 31.8 g/dL (ref 30.0–36.0)
MCV: 90.1 fL (ref 80.0–100.0)
Monocytes Absolute: 2.6 10*3/uL — ABNORMAL HIGH (ref 0.1–1.0)
Monocytes Relative: 11 %
Neutro Abs: 17.5 10*3/uL — ABNORMAL HIGH (ref 1.7–7.7)
Neutrophils Relative %: 73 %
Platelet Count: 172 10*3/uL (ref 150–400)
RBC: 3.42 MIL/uL — ABNORMAL LOW (ref 4.22–5.81)
RDW: 30.4 % — ABNORMAL HIGH (ref 11.5–15.5)
WBC Count: 23.9 10*3/uL — ABNORMAL HIGH (ref 4.0–10.5)
nRBC: 0.1 % (ref 0.0–0.2)

## 2018-10-12 LAB — CMP (CANCER CENTER ONLY)
ALT: 9 U/L (ref 0–44)
AST: 29 U/L (ref 15–41)
Albumin: 3.3 g/dL — ABNORMAL LOW (ref 3.5–5.0)
Alkaline Phosphatase: 192 U/L — ABNORMAL HIGH (ref 38–126)
Anion gap: 10 (ref 5–15)
BUN: 10 mg/dL (ref 8–23)
CO2: 20 mmol/L — ABNORMAL LOW (ref 22–32)
Calcium: 8.9 mg/dL (ref 8.9–10.3)
Chloride: 104 mmol/L (ref 98–111)
Creatinine: 0.87 mg/dL (ref 0.61–1.24)
GFR, Est AFR Am: >60 mL/min (ref >60)
GFR, Estimated: >60 mL/min (ref >60)
Glucose, Bld: 123 mg/dL — ABNORMAL HIGH (ref 70–99)
Potassium: 3.8 mmol/L (ref 3.5–5.1)
Sodium: 134 mmol/L — ABNORMAL LOW (ref 135–145)
Total Bilirubin: 0.3 mg/dL (ref 0.3–1.2)
Total Protein: 6.8 g/dL (ref 6.5–8.1)

## 2018-10-12 MED ORDER — IOHEXOL 300 MG/ML  SOLN
75.0000 mL | Freq: Once | INTRAMUSCULAR | Status: AC | PRN
  Administered 2018-10-12: 12:00:00 75 mL via INTRAVENOUS

## 2018-10-12 MED ORDER — HEPARIN SOD (PORK) LOCK FLUSH 100 UNIT/ML IV SOLN
INTRAVENOUS | Status: AC
  Filled 2018-10-12: qty 5

## 2018-10-12 MED ORDER — SODIUM CHLORIDE 0.9% FLUSH
10.0000 mL | Freq: Once | INTRAVENOUS | Status: AC
  Administered 2018-10-12: 10 mL
  Filled 2018-10-12: qty 10

## 2018-10-12 MED ORDER — HEPARIN SOD (PORK) LOCK FLUSH 100 UNIT/ML IV SOLN
500.0000 [IU] | Freq: Once | INTRAVENOUS | Status: AC
  Administered 2018-10-12: 500 [IU]

## 2018-10-14 ENCOUNTER — Inpatient Hospital Stay (HOSPITAL_BASED_OUTPATIENT_CLINIC_OR_DEPARTMENT_OTHER): Payer: Medicare Other | Admitting: Oncology

## 2018-10-14 ENCOUNTER — Other Ambulatory Visit: Payer: Self-pay

## 2018-10-14 VITALS — BP 132/63 | HR 122 | Temp 98.7°F | Resp 17 | Ht 70.0 in | Wt 130.3 lb

## 2018-10-14 DIAGNOSIS — R439 Unspecified disturbances of smell and taste: Secondary | ICD-10-CM | POA: Diagnosis not present

## 2018-10-14 DIAGNOSIS — C7889 Secondary malignant neoplasm of other digestive organs: Secondary | ICD-10-CM | POA: Diagnosis not present

## 2018-10-14 DIAGNOSIS — I10 Essential (primary) hypertension: Secondary | ICD-10-CM | POA: Diagnosis not present

## 2018-10-14 DIAGNOSIS — C184 Malignant neoplasm of transverse colon: Secondary | ICD-10-CM | POA: Diagnosis not present

## 2018-10-14 DIAGNOSIS — K6389 Other specified diseases of intestine: Secondary | ICD-10-CM | POA: Diagnosis not present

## 2018-10-14 DIAGNOSIS — R630 Anorexia: Secondary | ICD-10-CM | POA: Diagnosis not present

## 2018-10-14 NOTE — Progress Notes (Signed)
[place] OFFICE PROGRESS NOTE   Diagnosis: Colon cancer  INTERVAL HISTORY:   Cody Suarez completed another cycle of FOLFOX beginning 09/29/2018.  He reports less malaise following this cycle of chemotherapy.  No mouth sores.  He had a few episodes of diarrhea.  He continues to have altered taste and a poor appetite.  No abdominal pain.  He has noted sinus drainage.  Objective:  Vital signs in last 24 hours:  Blood pressure 132/63, pulse (!) 122, temperature 98.7 F (37.1 C), temperature source Temporal, resp. rate 17, height 5' 10"  (1.778 m), weight 130 lb 4.8 oz (59.1 kg), SpO2 100 %.    HEENT: Erythema at the palate, no thrush Resp: Lungs clear bilaterally Cardio: Regular rate and rhythm GI: No hepatosplenomegaly, no mass, nontender Vascular: No leg edema  Skin: Palms without erythema  Portacath/PICC-without erythema  Lab Results:  Lab Results  Component Value Date   WBC 23.9 (H) 10/12/2018   HGB 9.8 (L) 10/12/2018   HCT 30.8 (L) 10/12/2018   MCV 90.1 10/12/2018   PLT 172 10/12/2018   NEUTROABS 17.5 (H) 10/12/2018    CMP  Lab Results  Component Value Date   NA 134 (L) 10/12/2018   K 3.8 10/12/2018   CL 104 10/12/2018   CO2 20 (L) 10/12/2018   GLUCOSE 123 (H) 10/12/2018   BUN 10 10/12/2018   CREATININE 0.87 10/12/2018   CALCIUM 8.9 10/12/2018   PROT 6.8 10/12/2018   ALBUMIN 3.3 (L) 10/12/2018   AST 29 10/12/2018   ALT 9 10/12/2018   ALKPHOS 192 (H) 10/12/2018   BILITOT 0.3 10/12/2018   GFRNONAA >60 10/12/2018   GFRAA >60 10/12/2018    Lab Results  Component Value Date   CEA1 2.72 09/29/2018    Imaging:  Ct Abdomen Pelvis W Contrast  Result Date: 10/12/2018 CLINICAL DATA:  Pancreatic and colon cancer. EXAM: CT ABDOMEN AND PELVIS WITH CONTRAST TECHNIQUE: Multidetector CT imaging of the abdomen and pelvis was performed using the standard protocol following bolus administration of intravenous contrast. CONTRAST:  57m OMNIPAQUE  IOHEXOL 300 MG/ML  SOLN COMPARISON:  07/21/2018 FINDINGS: Lower chest: Unremarkable. Hepatobiliary: The liver shows diffusely decreased attenuation suggesting fat deposition. No suspicious focal abnormality within the liver parenchyma. Gallbladder is surgically absent. No intrahepatic or extrahepatic biliary dilation. Pancreas: Pancreatic tail lesion has decreased in size in the interval in become more cystic, measuring 4.1 x 4.1 cm today (27/7) compared to 8.6 x 6.1 cm previously. Lesion extends into the splenic hilum involves the posterior wall the stomach, as before. Splenic flexure of the colon is tethered to the lesion. Spleen: Mild heterogeneity, stable. Adrenals/Urinary Tract: No adrenal nodule or mass. No suspicious renal lesion. No evidence for hydroureter. The urinary bladder appears normal for the degree of distention. Stomach/Bowel: Stomach is decompressed. As above, pancreatic tail lesion involves posterior wall of the stomach. Duodenum is normally positioned as is the ligament of Treitz. Duodenal diverticulum again noted. No small bowel wall thickening. No small bowel dilatation. Status post right hemicolectomy splenic flexure tethered to the pancreatic tail mass. Vascular/Lymphatic: There is abdominal aortic atherosclerosis without aneurysm. There is no gastrohepatic or hepatoduodenal ligament lymphadenopathy. No intraperitoneal or retroperitoneal lymphadenopathy. No pelvic sidewall lymphadenopathy. Reproductive: The prostate gland and seminal vesicles are unremarkable. Other: No intraperitoneal free fluid. Musculoskeletal: No worrisome lytic or sclerotic osseous abnormality. IMPRESSION: 1. Interval decrease in size of the pancreatic tail mass with persistent involvement of the posterior gastric wall, splenic hilum, and splenic flexure of  the colon. 2. Status post right hemicolectomy. 3. Hepatic steatosis. 4.  Aortic Atherosclerois (ICD10-170.0) Electronically Signed   By: Misty Stanley M.D.   On:  10/12/2018 12:49    Medications: I have reviewed the patient's current medications.   Assessment/Plan: 1. 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 Negative for a BRAF mutation  Tumor invaded through the muscularis propria into pericolonic fatty tissue and involved the attached omentum.   Cycle 1 adjuvant Xeloda 05/28/2013.   Cycle 2 adjuvant Xeloda 06/18/2013.   Cycle 3 adjuvant Xeloda 07/09/2013.   Cycle 4 adjuvant Xeloda 07/30/2013.   Cycle 5 adjuvant Xeloda 08/20/2013.  Cycle 6 adjuvant Xeloda 09/11/2013.  cycle 7 adjuvant Xeloda 10/02/2013  Cycle 8 adjuvant Xeloda 10/23/2013  CTs of the chest, abdomen, and pelvis on 07/18/2014-negative for recurrent colon cancer   CTs abdomen/pelvis 09/06/2014 showed acute cholecystitis.  Colonoscopy 08/02/2015-chronic colitis, no malignancy  CTs 09/17/2015, new mass near the tail the pancreas, stable lung nodules  Biopsy mesenteric mass 10/04/2015 with metastatic adenocarcinoma consistent with colorectal primary  PET scan 10/10/2015 with soft tissue thickening within the peritoneal space of the left upper quadrant with SUV Max 7.9; no additional sites of peritoneal nodularity to suggest metastasis; hypermetabolic ill-defined nodule in the left lower lobe favored inflammatory.  Resection of the left abdominal mesenteric mass/tail the pancreas on 11/05/2015 with the pathology confirming metastatic colon cancer, positive "serosal" margin  CTs 07/29/2016-no evidence of metastatic disease, stable small lung nodules, decreased size of pancreatic resection bed fluid/mass  CTs 06/28/2017-decreased fluid and increased soft tissue at the area of the distal pancreatectomy with a few foci of gas  CT 09/29/2017-enlargement of left upper quadrant soft tissue mass  between the stomach, spleen, and pancreas tail, stable nodules at the lung bases  CT biopsy of the upper quadrant mass 10/19/2017-metastatic colon cancer, MSS, tumor mutation burden 1, no KRAS, BRAF, or NRAS mutation  CTs 12/24/2017-complex left upper quadrant process involving the stomach, spleen, tail the pancreas, and distal transverse colon.  CT 04/27/2018-stable to mild progression of the left upper quadrant mass, compression of the stomach with no obstruction, no other evidence of metastatic disease, resolution of small fluid collection lateral to the left upper quadrant mass  CT 07/21/2018-enlargement of the left upper quadrant mass with involvement of the posterior stomach and spleen. Possible new right upper lung nodule  Cycle 1 FOLFOX 08/17/2018  Cycle 2 FOLFOX 08/31/2018, Udenyca added  Cycle 3 FOLFOX 09/14/2018, Udenyca  Cycle 4 FOLFOX 09/29/2018, oxaliplatin and 5-fluorouracil dose reduced 5-FU bolus held  CT abdomen/pelvis 10/12/2018-decrease in size of mass at the pancreas tail/posterior gastric wall, and splenic flexure of the colon, mass is more cystic 2. Ulcerative colitis-extensive chronic active ulcerative colitis was noted on the colon resection specimen 03/28/2013.  Followed by Dr.Magod 3. Hypertension.  4. History of microcytic anemia-likely iron deficiency, unable to tolerate oral iron. Improved. 5. Possible area of cecal wall thickening noted on abdominal CT 03/20/2013. 6. Family history of colon cancer (maternal grandfather died in his 73s with colon cancer). 7. Bilateral calf and low anterior leg pain. Bilateral lower extremity venous duplex negative for DVT 07/14/2013. Right ABI with moderate, borderline severe arterial insufficiency; left ABI suggestive of moderate arterial insufficiency. He was referred to vascular. 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 TEE showed thrombus in the descending aorta. He underwent right popliteal to  peroneal artery bypass graft, thrombectomy anterior tibialis and attempted thrombectomy tibioperoneal trunk and posterior tibialis on 08/11/2013. Right calf pain 09/26/2013 with findings of occlusion of right popliteal to peroneal bypass status post thrombolysis. Now on anticoagulation with Xarelto.  recurrent pain and ulceration at the right foot, status post a popliteal to posterior tibial artery bypass using a cephalic vein graft on 64/38/3779  Xarelto discontinued due to high risk for bleeding and aspirin 81 mg daily initiated hospitalization July 2020 8. Status post laparoscopic cholecystectomy 09/08/2014 9. Anorexia following the mesenteric mass/pancreas tail resection-improved 10. Abdominal abscess November 2017-pancreas "cysts "drainage catheter placed 11/26/2015; CT 02/04/2016 with no change to slight decreasein size of residual pancreatic tail fluid collection.Drainage catheter removed approximately early March 2018  CT 02/20/2016-new small left pleural effusion, increased size of the surgical bed fluid collection, indeterminate pulmonary nodules  CT 07/29/2016-decreased fluid/soft tissue density near the pancreas is no evidence of metastatic disease, stable subcentimeter lung nodules 11.Admission 12/24/2017 with an abdominal abscess, status post placement of a percutaneous drain and antibiotic therapy; CTs abdomen/pelvis 01/04/2018-near complete drainage of previously noted complex fluid collection in the left upper quadrant. 2 small residual collections remain. Residual soft tissue mass at the clips concerning for residual recurrent neoplasm. Peritoneal wall thickening also concerning for metastatic disease. Drain removed 01/04/2018  CT 01/18/2018-no change in soft tissue mass at the tail of pancreas with involvement of the gastric fundus and spleen, there is central low attenuation  Admission 01/20/2018 with fever and failure to thrive, no drainable fluid collection, placed on IV  antibiotics, discharged to complete an outpatient course of IV ceftriaxone and Flagyl completed 02/20/2018  12.Admission 7/26/2020transient speech abnormality and microcytic anemia  13.  Port-A-Cath placement, Dr. Hassell Done, 08/15/2018   Disposition: Cody Suarez has completed 4 cycles of FOLFOX.  He tolerated the last cycle better with dose reductions.  The CT confirms significant improvement in the left abdominal mass.  I reviewed the CT images with him.  I reviewed the CT images with him.   His wife and daughter were present by telephone for today's visit.  Cody Suarez declines further chemotherapy.  I recommend continuing FOLFOX.  We discussed other systemic treatment options including EGFR directed therapy.  He does not wish to receive further systemic therapy at present.  He agrees to a return visit in 4 weeks.  25 minutes were spent with the patient today.  The majority of the time was used for counseling and coordination of care.  Betsy Coder, MD  10/14/2018  10:49 AM

## 2018-10-17 ENCOUNTER — Telehealth: Payer: Self-pay | Admitting: *Deleted

## 2018-10-17 DIAGNOSIS — D509 Iron deficiency anemia, unspecified: Secondary | ICD-10-CM

## 2018-10-17 MED ORDER — FERROUS SULFATE 325 (65 FE) MG PO TABS: 325.0000 mg | ORAL_TABLET | Freq: Every day | ORAL | 2 refills | Status: DC

## 2018-10-17 NOTE — Telephone Encounter (Addendum)
Called that he can't tolerate the ferrous sulfate (325 mg tid) due to cramping and diarrhea. Asking if there is a different preparation or different drug he can take instead? Per Dr. Benay Spice: Decrease ferrous sulfate to daily and if still causes GI distress, just stop it. Patient notified.

## 2018-10-20 ENCOUNTER — Telehealth: Payer: Self-pay | Admitting: Family Medicine

## 2018-10-20 NOTE — Telephone Encounter (Signed)
I have placed a handicap placard in the bin up front with a charge sheet

## 2018-10-21 NOTE — Telephone Encounter (Signed)
Pick up from the back and made pt aware they are ready for pick up

## 2018-10-21 NOTE — Telephone Encounter (Signed)
Paperwork given to PCP for completion.  

## 2018-10-21 NOTE — Telephone Encounter (Signed)
Form completed and placed in basket  

## 2018-10-21 NOTE — Telephone Encounter (Signed)
fyi

## 2018-10-25 ENCOUNTER — Telehealth: Payer: Self-pay | Admitting: *Deleted

## 2018-10-25 MED ORDER — PREDNISONE 10 MG PO TABS: 10.0000 mg | ORAL_TABLET | Freq: Every day | ORAL | 2 refills | Status: DC

## 2018-10-25 NOTE — Telephone Encounter (Addendum)
Patient called asking if OK for him to go to the dentist on 11/17/18. Is interested in trying the prednisone MD suggested for his appetite. What dose does he suggest (already has 1 mg tabs at home)? Per Dr. Benay Spice: Prednisone 10 mg daily. OK to go to dentist. Notified wife and she said to send in 10 mg script to pharmacy, doubting he will want to take 10 pills/day.

## 2018-11-11 ENCOUNTER — Other Ambulatory Visit: Payer: Self-pay

## 2018-11-11 ENCOUNTER — Inpatient Hospital Stay: Payer: Medicare Other | Attending: Oncology | Admitting: Oncology

## 2018-11-11 ENCOUNTER — Telehealth: Payer: Self-pay | Admitting: Oncology

## 2018-11-11 VITALS — BP 136/63 | HR 98 | Temp 98.7°F | Resp 17 | Ht 70.0 in | Wt 136.2 lb

## 2018-11-11 DIAGNOSIS — C7889 Secondary malignant neoplasm of other digestive organs: Secondary | ICD-10-CM | POA: Diagnosis not present

## 2018-11-11 DIAGNOSIS — I10 Essential (primary) hypertension: Secondary | ICD-10-CM | POA: Insufficient documentation

## 2018-11-11 DIAGNOSIS — D649 Anemia, unspecified: Secondary | ICD-10-CM | POA: Diagnosis not present

## 2018-11-11 DIAGNOSIS — C184 Malignant neoplasm of transverse colon: Secondary | ICD-10-CM | POA: Insufficient documentation

## 2018-11-11 NOTE — Progress Notes (Signed)
[place] OFFICE PROGRESS NOTE   Diagnosis: Colon cancer  INTERVAL HISTORY:    Mr.Taliercio returns as scheduled.  He feels well.  He reports a good appetite.  His energy level has improved.  No pain or bleeding.  Objective:  Vital signs in last 24 hours:  Blood pressure 136/63, pulse 98, temperature 98.7 F (37.1 C), temperature source Temporal, resp. rate 17, height 5' 10"  (1.778 m), weight 136 lb 3.2 oz (61.8 kg), SpO2 100 %.     GI: No mass, nontender, no hepatosplenomegaly Vascular: No leg edema    Portacath/PICC-without erythema  Lab Results:  Lab Results  Component Value Date   WBC 23.9 (H) 10/12/2018   HGB 9.8 (L) 10/12/2018   HCT 30.8 (L) 10/12/2018   MCV 90.1 10/12/2018   PLT 172 10/12/2018   NEUTROABS 17.5 (H) 10/12/2018    CMP  Lab Results  Component Value Date   NA 134 (L) 10/12/2018   K 3.8 10/12/2018   CL 104 10/12/2018   CO2 20 (L) 10/12/2018   GLUCOSE 123 (H) 10/12/2018   BUN 10 10/12/2018   CREATININE 0.87 10/12/2018   CALCIUM 8.9 10/12/2018   PROT 6.8 10/12/2018   ALBUMIN 3.3 (L) 10/12/2018   AST 29 10/12/2018   ALT 9 10/12/2018   ALKPHOS 192 (H) 10/12/2018   BILITOT 0.3 10/12/2018   GFRNONAA >60 10/12/2018   GFRAA >60 10/12/2018    Lab Results  Component Value Date   CEA1 2.72 09/29/2018     Medications: I have reviewed the patient's current medications.   Assessment/Plan: 1. 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 Negative for a BRAF mutation  Tumor invaded through the muscularis propria into pericolonic fatty tissue and involved the attached omentum.   Cycle 1 adjuvant Xeloda 05/28/2013.   Cycle 2 adjuvant Xeloda 06/18/2013.   Cycle 3 adjuvant Xeloda 07/09/2013.   Cycle 4 adjuvant Xeloda 07/30/2013.   Cycle 5  adjuvant Xeloda 08/20/2013.  Cycle 6 adjuvant Xeloda 09/11/2013.  cycle 7 adjuvant Xeloda 10/02/2013  Cycle 8 adjuvant Xeloda 10/23/2013  CTs of the chest, abdomen, and pelvis on 07/18/2014-negative for recurrent colon cancer   CTs abdomen/pelvis 09/06/2014 showed acute cholecystitis.  Colonoscopy 08/02/2015-chronic colitis, no malignancy  CTs 09/17/2015, new mass near the tail the pancreas, stable lung nodules  Biopsy mesenteric mass 10/04/2015 with metastatic adenocarcinoma consistent with colorectal primary  PET scan 10/10/2015 with soft tissue thickening within the peritoneal space of the left upper quadrant with SUV Max 7.9; no additional sites of peritoneal nodularity to suggest metastasis; hypermetabolic ill-defined nodule in the left lower lobe favored inflammatory.  Resection of the left abdominal mesenteric mass/tail the pancreas on 11/05/2015 with the pathology confirming metastatic colon cancer, positive "serosal" margin  CTs 07/29/2016-no evidence of metastatic disease, stable small lung nodules, decreased size of pancreatic resection bed fluid/mass  CTs 06/28/2017-decreased fluid and increased soft tissue at the area of the distal pancreatectomy with a few foci of gas  CT 09/29/2017-enlargement of left upper quadrant soft tissue mass between the stomach, spleen, and pancreas tail, stable nodules at the lung bases  CT biopsy of the upper quadrant mass 10/19/2017-metastatic colon cancer, MSS, tumor mutation burden 1, no KRAS, BRAF, or NRAS mutation  CTs 12/24/2017-complex left upper quadrant process involving the stomach, spleen, tail the pancreas, and distal transverse colon.  CT 04/27/2018-stable to mild progression  of the left upper quadrant mass, compression of the stomach with no obstruction, no other evidence of metastatic disease, resolution of small fluid collection lateral to the left upper quadrant mass  CT 07/21/2018-enlargement of the left upper quadrant mass  with involvement of the posterior stomach and spleen. Possible new right upper lung nodule  Cycle 1 FOLFOX 08/17/2018  Cycle 2 FOLFOX 08/31/2018, Udenyca added  Cycle 3 FOLFOX 09/14/2018, Udenyca  Cycle 4 FOLFOX 09/29/2018, oxaliplatin and 5-fluorouracil dose reduced 5-FU bolus held  CT abdomen/pelvis 10/12/2018-decrease in size of mass at the pancreas tail/posterior gastric wall, and splenic flexure of the colon, mass is more cystic 2. Ulcerative colitis-extensive chronic active ulcerative colitis was noted on the colon resection specimen 03/28/2013.  Followed by Dr.Magod 3. Hypertension.  4. History of microcytic anemia-likely iron deficiency, unable to tolerate oral iron. Improved. 5. Possible area of cecal wall thickening noted on abdominal CT 03/20/2013. 6. Family history of colon cancer (maternal grandfather died in his 63s with colon cancer). 7. Bilateral calf and low anterior leg pain. Bilateral lower extremity venous duplex negative for DVT 07/14/2013. Right ABI with moderate, borderline severe arterial insufficiency; left ABI suggestive of moderate arterial insufficiency. He was referred to vascular. 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 TEE showed thrombus in the descending aorta. He underwent right popliteal to peroneal artery bypass graft, thrombectomy anterior tibialis and attempted thrombectomy tibioperoneal trunk and posterior tibialis on 08/11/2013. Right calf pain 09/26/2013 with findings of occlusion of right popliteal to peroneal bypass status post thrombolysis. Now on anticoagulation with Xarelto.  recurrent pain and ulceration at the right foot, status post a popliteal to posterior tibial artery bypass using a cephalic vein graft on 29/19/1660  Xarelto discontinued due to high risk for bleeding and aspirin 81 mg daily initiated hospitalization July 2020 8. Status post laparoscopic cholecystectomy 09/08/2014 9. Anorexia following the mesenteric  mass/pancreas tail resection-improved 10. Abdominal abscess November 2017-pancreas "cysts "drainage catheter placed 11/26/2015; CT 02/04/2016 with no change to slight decreasein size of residual pancreatic tail fluid collection.Drainage catheter removed approximately early March 2018  CT 02/20/2016-new small left pleural effusion, increased size of the surgical bed fluid collection, indeterminate pulmonary nodules  CT 07/29/2016-decreased fluid/soft tissue density near the pancreas is no evidence of metastatic disease, stable subcentimeter lung nodules 11.Admission 12/24/2017 with an abdominal abscess, status post placement of a percutaneous drain and antibiotic therapy; CTs abdomen/pelvis 01/04/2018-near complete drainage of previously noted complex fluid collection in the left upper quadrant. 2 small residual collections remain. Residual soft tissue mass at the clips concerning for residual recurrent neoplasm. Peritoneal wall thickening also concerning for metastatic disease. Drain removed 01/04/2018  CT 01/18/2018-no change in soft tissue mass at the tail of pancreas with involvement of the gastric fundus and spleen, there is central low attenuation  Admission 01/20/2018 with fever and failure to thrive, no drainable fluid collection, placed on IV antibiotics, discharged to complete an outpatient course of IV ceftriaxone and Flagyl completed 02/20/2018  12.Admission 7/26/2020transient speech abnormality and microcytic anemia  13.  Port-A-Cath placement, Dr. Hassell Done, 08/15/2018     Disposition: Mr. Brix appears well.  He has gained weight since discontinuing chemotherapy.  He restated his decision against further chemotherapy.  He will return for an office visit and Port-A-Cath flush in 1 month.  He will contact us in the interim for new symptoms.  Betsy Coder, MD  11/11/2018  11:03 AM

## 2018-11-11 NOTE — Telephone Encounter (Signed)
Scheduled per 11/06 los, patient received after visit summary and calender.

## 2018-11-15 ENCOUNTER — Encounter: Payer: Self-pay | Admitting: Physician Assistant

## 2018-11-15 ENCOUNTER — Ambulatory Visit (INDEPENDENT_AMBULATORY_CARE_PROVIDER_SITE_OTHER): Payer: Medicare Other | Admitting: Physician Assistant

## 2018-11-15 ENCOUNTER — Other Ambulatory Visit: Payer: Self-pay

## 2018-11-15 VITALS — BP 112/62 | HR 96 | Temp 99.1°F | Resp 14 | Ht 70.0 in | Wt 134.0 lb

## 2018-11-15 DIAGNOSIS — Z0000 Encounter for general adult medical examination without abnormal findings: Secondary | ICD-10-CM

## 2018-11-15 NOTE — Progress Notes (Signed)
Subjective:   Cody Tata. is a 72 y.o. male who presents for Medicare Annual/Subsequent preventive examination.  No acute concerns today. Last session of chemotherapy was approximately one month ago. He states he is feeling much better, has regained his appetite and energy, is working on regaining his weight.    Review of Systems:  Denies SOB, chest pain, palpitations, changes in bowel habits, urinary frequency or dysuria, abdominal pain, nausea, vomiting, hematochezia, melena, lightheadedness or dizziness.   Objective:    Vitals: BP 112/62    Pulse 96    Temp 99.1 F (37.3 C) (Temporal)    Resp 14    Ht 5' 10"  (1.778 m)    Wt 60.8 kg    SpO2 98%    BMI 19.23 kg/m   Body mass index is 19.23 kg/m.  Advanced Directives 11/15/2018 08/15/2018 08/10/2018 07/31/2018 07/31/2018 01/20/2018 01/20/2018  Does Patient Have a Medical Advance Directive? Yes Yes Yes No No Yes Yes  Type of Paramedic of [place];Living will [place];Living will [place];Living will - - Living will Living will  Does patient want to make changes to medical advance directive? - No - Patient declined - - - No - Patient declined No - Patient declined  Copy of [place] in Chart? Yes - validated most recent copy scanned in chart (See row information) No - copy requested - - - - -  Would patient like information on creating a medical advance directive? - No - Patient declined - No - Patient declined No - Guardian declined - -  Pre-existing out of facility DNR order (yellow form or pink MOST form) - - - - - - -    Tobacco Social History   Tobacco Use  Smoking Status Never Smoker  Smokeless Tobacco Never Used  Tobacco Comment   states his father died of emphysema from smoking, he did not want to be like that     Counseling given: Yes Comment: states his father died of emphysema from smoking, he did not want to be like that   Clinical  Intake:  Pre-visit preparation completed: No  Pain : No/denies pain     BMI - recorded: 19.23 Nutritional Status: BMI of 19-24  Normal Diabetes: No  How often do you need to have someone help you when you read instructions, pamphlets, or other written materials from your doctor or pharmacy?: 1 - Never  Interpreter Needed?: No     Past Medical History:  Diagnosis Date   Anemia of chronic disease 2015   "related to cancer tx" (08/09/2013)   Blood clot in vein 2015   Colon cancer ([place]) 03/2013   History of kidney stones    x 2passed them both   Hypertension    Peripheral vascular disease ([place])    Ulcerative colitis ([place])    history   Past Surgical History:  Procedure Laterality Date   ABDOMINAL AORTAGRAM N/A 08/09/2013   Procedure: ABDOMINAL Maxcine Ham;  Surgeon: [name], MD;  Location[facility];  Service: Cardiovascular;  Laterality: N/A;   CHOLECYSTECTOMY N/A 09/08/2014   Procedure: LAPAROSCOPIC CHOLECYSTECTOMY WITH INTRAOPERATIVE CHOLANGIOGRAM;  Surgeon: Armandina Gemma, MD;  Location: [facility];  Service: General;  Laterality: N/A;   COLON SURGERY  02/2013   COLON SURGERY  10/2015   FEMORAL-POPLITEAL BYPASS GRAFT Right 08/11/2013   Procedure:   RIGHT - POPLITEAL TO PERONEAL ARTERY BYPASS GRAFT  WITH NONREVERSED SAPHENOUS VEIN GRAFT,tHROMBECTOMY  ANTERIOR TIBIALIS,ATTEMPTED THROMBECTOMY TIBIO-PERONEAL TRUNK AND POSTERIOR TIBIALIS, INTRAOPERATIVE ARTERIOGRAM.;  Surgeon: Mal Misty, MD;  Location: [facility];  Service: Vascular;  Laterality: Right;   FEMORAL-TIBIAL BYPASS GRAFT Right 11/17/2013   Procedure: BYPASS GRAFT RIGHT ABOVE KNEE POPLITEAL TO POSTERIOR TIBIAL ARTERY USING RIGHT NON-REVERSED CEPHALIC VEIN;  Surgeon: Mal Misty, MD;  Location: [facility];  Service: Vascular;  Laterality: Right;   HERNIA REPAIR  ~ 1995; 03/2013   UHR   INTRAOPERATIVE ARTERIOGRAM Right 11/17/2013   Procedure: INTRA OPERATIVE ARTERIOGRAM;  Surgeon: Mal Misty, MD;  Location: [facility];  Service: Vascular;  Laterality: Right;   IR GENERIC HISTORICAL  12/11/2015   IR RADIOLOGIST EVAL & MGMT 12/11/2015 Arne Cleveland, MD [facility]   IR GENERIC HISTORICAL  12/24/2015   IR RADIOLOGIST EVAL & MGMT 12/24/2015 Jacqulynn Cadet, MD [facility]   IR GENERIC HISTORICAL  01/21/2016   IR RADIOLOGIST EVAL & MGMT 01/21/2016 [name], MD [facility]   IR GENERIC HISTORICAL  02/04/2016   IR RADIOLOGIST EVAL & MGMT 02/04/2016 Sandi Mariscal, MD [facility]   IR GENERIC HISTORICAL  02/12/2016   IR CATHETER TUBE CHANGE 02/12/2016 Corrie Mckusick, DO [facility]   IR GENERIC HISTORICAL  02/20/2016   IR RADIOLOGIST EVAL & MGMT 02/20/2016 Markus Daft, MD [facility]   IR SINUS/FIST TUBE CHK-NON GI  01/04/2018   LAPAROSCOPIC RIGHT HEMI COLECTOMY N/A 03/28/2013   Procedure: LAPAROSCOPIC ASSISTED HEMI COLECTOMY;  Surgeon: Pedro Earls, MD;  [facility];  Service: General;  Laterality: N/A;   LAPAROSCOPY N/A 11/05/2015   Procedure: LAPAROSCOPY DIAGNOSTIC;  Surgeon: [name], MD;  [facility];  Service: General;  Laterality: N/A;   LAPAROTOMY N/A 11/05/2015   Procedure: EXPLORATORY LAPAROTOMY, resection of mass at tail of pancreas;  Surgeon: [name], MD;  [facility];  Service: General;  Laterality: N/A;   LOWER EXTREMITY ANGIOGRAM Bilateral 08/09/2013   PORTACATH PLACEMENT Left 08/15/2018   Procedure: PORT A CATH PLACEMENT;  Surgeon: [name], MD;  [facility];  Service: General;  Laterality: Left;   TEE WITHOUT CARDIOVERSION N/A 08/10/2013   Procedure: TRANSESOPHAGEAL ECHOCARDIOGRAM (TEE);  Surgeon: Candee Furbish, MD;  Location: [facility];  Service: Cardiovascular;  Laterality: N/A;   TONSILLECTOMY  ~ 2423   UMBILICAL HERNIA REPAIR  1995; 03/2013   VASECTOMY     VEIN HARVEST Right 11/17/2013   Procedure: HARVEST OF RIGHT UPPER EXTREMITY CEPHALIC VEIN;  Surgeon: Mal Misty, MD;  [facility];  Service: Vascular;   Laterality: Right;   Family History  Problem Relation Age of Onset   Heart disease Mother    Emphysema Father    Social History   Socioeconomic History   Marital status: Married    Spouse name: Not on file   Number of children: Not on file   Years of education: Not on file   Highest education level: Not on file  Occupational History   Not on file  Social Needs   Financial resource strain: Not on file   Food insecurity    Worry: Not on file    Inability: Not on file   Transportation needs    Medical: Not on file    Non-medical: Not on file  Tobacco Use   Smoking status: Never Smoker   Smokeless tobacco: Never Used   Tobacco comment: states his father died of emphysema from smoking, he did not want to be like that  Substance and Sexual Activity   Alcohol use: No   Drug use: No   Sexual activity: Yes  Lifestyle   Physical activity    Days per week: Patient refused    Minutes per session: Patient refused   Stress: To some extent  Relationships   Social connections    Talks on phone: Patient refused    Gets together: Patient refused    Attends religious service: Patient refused    Active member of club or organization: Patient refused    Attends meetings of clubs or organizations: Patient refused    Relationship status: Patient refused  Other Topics Concern   Not on file  Social History Narrative   Not on file    Outpatient Encounter Medications as of 11/15/2018  Medication Sig   acetaminophen (TYLENOL) 500 MG tablet Take 1,000 mg by mouth daily as needed for mild pain or headache.    aspirin EC 81 MG tablet Take 1 tablet (81 mg total) by mouth daily.   calcium carbonate (TUMS EX) 750 MG chewable tablet Chew 1-2 tablets by mouth daily as needed for heartburn.   ferrous sulfate 325 (65 FE) MG tablet Take 1 tablet (325 mg total) by mouth daily with breakfast.   Homeopathic Products (THERAWORX RELIEF EX) Apply 1 application topically daily  as needed (calf pain).   HYDROcodone-acetaminophen (NORCO/VICODIN) 5-325 MG tablet Take 1 tablet by mouth every 6 (six) hours as needed for moderate pain.   lidocaine-prilocaine (EMLA) cream Apply 1 tsp to port site 1 hour prior to stick and cover with plastic wrap   Liniments (BLUE-EMU SUPER STRENGTH EX) Apply 1 application topically daily as needed (knee pain).   losartan (COZAAR) 100 MG tablet Take 0.5 tablets (50 mg total) by mouth daily.   ondansetron (ZOFRAN) 4 MG tablet TAKE 1 TABLET BY MOUTH EVERY 8 HOURS AS NEEDED FOR NAUSEA FOR VOMITING   potassium chloride SA (K-DUR) 20 MEQ tablet Take 1 tablet (20 mEq total) by mouth 2 (two) times daily.   predniSONE (DELTASONE) 10 MG tablet Take 1 tablet (10 mg total) by mouth daily with breakfast.   prochlorperazine (COMPAZINE) 10 MG tablet Take 1 tablet (10 mg total) by mouth every 6 (six) hours as needed for nausea.   spironolactone (ALDACTONE) 25 MG tablet Take 1 tablet by mouth once daily   sulfaSALAzine (AZULFIDINE) 500 MG EC tablet Take 1,500 mg by mouth daily.    vitamin B-12 (CYANOCOBALAMIN) 1000 MCG tablet Take 1,000 mcg by mouth daily.   zolpidem (AMBIEN) 5 MG tablet Take 1 tablet (5 mg total) by mouth at bedtime as needed for sleep.   No facility-administered encounter medications on file as of 11/15/2018.     Activities of Daily Living In your present state of health, do you have any difficulty performing the following activities: 11/15/2018 08/10/2018  Hearing? Tempie Donning  Comment Wears hearing aids [place]  Vision? N -  Difficulty concentrating or making decisions? N N  Walking or climbing stairs? N N  Dressing or bathing? N N  Doing errands, shopping? N N  Preparing Food and eating ? N -  Using the Toilet? N -  In the past six months, have you accidently leaked urine? N -  Do you have problems with loss of bowel control? N -  Managing your Medications? N -  Managing your Finances? N -  Housekeeping or managing your  Housekeeping? N -  Some recent data might be hidden    Patient Care  Team: Midge Minium, MD as PCP - General (Family Medicine) [name], MD as PCP - Cardiology (Cardiology) [name], MD as Consulting Physician (Cardiology) [name], MD as Consulting Physician (Oncology) [name], MD as Consulting Physician (Gastroenterology) Nickel, Sharmon Leyden, NP as Nurse Practitioner (Vascular Surgery) Mertha Baars (Dentistry) [name], MD as Consulting Physician (General Surgery)   Assessment:   This is a routine wellness examination for [place].  Exercise Activities and Dietary recommendations Current Exercise Habits: The patient does not participate in regular exercise at present  Goals     Patient Stated     Remain active.        Fall Risk Fall Risk  11/15/2018 01/31/2018 01/07/2018 11/12/2017 07/28/2017  Falls in the past year? 0 0 0 0 No  Number falls in past yr: 0 - - - -  Risk for fall due to : - - - - -  Risk for fall due to: Comment - - - - -   Is the patient's home free of loose throw rugs in walkways, pet beds, electrical cords, etc?   yes      Grab bars in the bathroom? no      Handrails on the stairs?   No stairs at home      Adequate lighting?   yes  Depression Screen PHQ 2/9 Scores 11/15/2018 01/31/2018 01/07/2018 11/12/2017  PHQ - 2 Score 0 0 0 0  PHQ- 9 Score - - 0 0    Cognitive Function MMSE - Mini Mental State Exam 11/15/2018 07/28/2017  Orientation to time 5 5  Orientation to Place 5 5  Registration 3 3  Attention/ Calculation 5 5  Recall 2 3  Language- name 2 objects 2 2  Language- repeat 1 1  Language- follow 3 step command 3 3  Language- read & follow direction 1 1  Write a sentence 1 1  Copy design 1 1  Total score 29 30        Immunization History  Administered Date(s) Administered   Influenza, High Dose Seasonal PF 10/11/2013, 08/13/2016, 09/12/2017   Influenza,inj,Quad PF,6+ Mos 09/23/2015, 09/14/2018    Influenza-Unspecified 12/21/2011, 10/11/2013   Pneumococcal Conjugate-13 07/28/2017   Pneumococcal Polysaccharide-23 12/21/2011   Pneumococcal-Unspecified 09/06/2011    Qualifies for Shingles Vaccine? Yes   Screening Tests Health Maintenance  Topic Date Due   TETANUS/TDAP  11/15/2019 (Originally 12/18/1964)   COLONOSCOPY  06/29/2023   INFLUENZA VACCINE  Completed   Hepatitis C Screening  Completed   PNA vac Low Risk Adult  Completed   Cancer Screenings: Lung: Low Dose CT Chest recommended if Age 32-80 years, 30 pack-year currently smoking OR have quit w/in 15years. Patient does not qualify. Colorectal: colonoscopy UTD, followed by GI and oncology  Additional Screenings: Hepatitis C Screening: UTD      Plan:    1. Encounter for Medicare annual wellness exam Health maintenance reviewed, patient up to date on required immunizations and screenings. Tdap deferred. Routine labs performed within past 6 months, followed by oncology.   I have personally reviewed and noted the following in the patients chart:    Medical and social history  Use of alcohol, tobacco or illicit drugs   Current medications and supplements  Functional ability and status  Nutritional status  Physical activity  Advanced directives  List of other physicians  Hospitalizations, surgeries, and ER visits in previous 12 months  Vitals  Screenings to include cognitive, depression, and falls  Referrals and appointments  In addition, I have reviewed and discussed with patient certain preventive protocols, quality metrics, and best practice recommendations. A written personalized care plan for preventive services as well as general preventive health recommendations were provided to patient.     Deboraha Sprang, Student-PA  11/15/2018

## 2018-11-15 NOTE — Patient Instructions (Signed)
[name] , Thank you for taking time to come for your Medicare Wellness Visit. I appreciate your ongoing commitment to your health goals. Please review the following plan we discussed and let me know if I can assist you in the future.   These are the goals we discussed: Goals    . Patient Stated     Remain active.        This is a list of the screening recommended for you and due dates:  Health Maintenance  Topic Date Due  . Tetanus Vaccine  11/15/2019*  . Colon Cancer Screening  06/29/2023  . Flu Shot  Completed  .  Hepatitis C: One time screening is recommended by Center for Disease Control  (CDC) for  adults born from [birth year] through 1966.   Completed  . Pneumonia vaccines  Completed  *Topic was postponed. The date shown is not the original due date.    Preventive Care 74 Years and Older, Male Preventive care refers to lifestyle choices and visits with your health care provider that can promote health and wellness. This includes:  A yearly physical exam. This is also called an annual well check.  Regular dental and eye exams.  Immunizations.  Screening for certain conditions.  Healthy lifestyle choices, such as diet and exercise. What can I expect for my preventive care visit? Physical exam Your health care provider will check:  Height and weight. These may be used to calculate body mass index (BMI), which is a measurement that tells if you are at a healthy weight.  Heart rate and blood pressure.  Your skin for abnormal spots. Counseling Your health care provider may ask you questions about:  Alcohol, tobacco, and drug use.  Emotional well-being.  Home and relationship well-being.  Sexual activity.  Eating habits.  History of falls.  Memory and ability to understand (cognition).  Work and work Statistician. What immunizations do I need?  Influenza (flu) vaccine  This is recommended every year. Tetanus, diphtheria, and pertussis (Tdap) vaccine  You  may need a Td booster every 10 years. Varicella (chickenpox) vaccine  You may need this vaccine if you have not already been vaccinated. Zoster (shingles) vaccine  You may need this after age 80. Pneumococcal conjugate (PCV13) vaccine  One dose is recommended after age 28. Pneumococcal polysaccharide (PPSV23) vaccine  One dose is recommended after age 36. Measles, mumps, and rubella (MMR) vaccine  You may need at least one dose of MMR if you were born in 1957 or later. You may also need a second dose. Meningococcal conjugate (MenACWY) vaccine  You may need this if you have certain conditions. Hepatitis A vaccine  You may need this if you have certain conditions or if you travel or work in places where you may be exposed to hepatitis A. Hepatitis B vaccine  You may need this if you have certain conditions or if you travel or work in places where you may be exposed to hepatitis B. Haemophilus influenzae type b (Hib) vaccine  You may need this if you have certain conditions. You may receive vaccines as individual doses or as more than one vaccine together in one shot (combination vaccines). Talk with your health care provider about the risks and benefits of combination vaccines. What tests do I need? Blood tests  Lipid and cholesterol levels. These may be checked every 5 years, or more frequently depending on your overall health.  Hepatitis C test.  Hepatitis B test. Screening  Lung cancer screening.  You may have this screening every year starting at age 70 if you have a 30-pack-year history of smoking and currently smoke or have quit within the past 15 years.  Colorectal cancer screening. All adults should have this screening starting at age 27 and continuing until age 90. Your health care provider may recommend screening at age 54 if you are at increased risk. You will have tests every 1-10 years, depending on your results and the type of screening test.  Prostate cancer  screening. Recommendations will vary depending on your family history and other risks.  Diabetes screening. This is done by checking your blood sugar (glucose) after you have not eaten for a while (fasting). You may have this done every 1-3 years.  Abdominal aortic aneurysm (AAA) screening. You may need this if you are a current or former smoker.  Sexually transmitted disease (STD) testing. Follow these instructions at home: Eating and drinking  Eat a diet that includes fresh fruits and vegetables, whole grains, lean protein, and low-fat dairy products. Limit your intake of foods with high amounts of sugar, saturated fats, and salt.  Take vitamin and mineral supplements as recommended by your health care provider.  Do not drink alcohol if your health care provider tells you not to drink.  If you drink alcohol: ? Limit how much you have to 0-2 drinks a day. ? Be aware of how much alcohol is in your drink. In the [place] one drink equals one 12 oz bottle of beer (355 mL), one 5 oz glass of wine (148 mL), or one 1 oz glass of hard liquor (44 mL). Lifestyle  Take daily care of your teeth and gums.  Stay active. Exercise for at least 30 minutes on 5 or more days each week.  Do not use any products that contain nicotine or tobacco, such as cigarettes, e-cigarettes, and chewing tobacco. If you need help quitting, ask your health care provider.  If you are sexually active, practice safe sex. Use a condom or other form of protection to prevent STIs (sexually transmitted infections).  Talk with your health care provider about taking a low-dose aspirin or statin. What's next?  Visit your health care provider once a year for a well check visit.  Ask your health care provider how often you should have your eyes and teeth checked.  Stay up to date on all vaccines. This information is not intended to replace advice given to you by your health care provider. Make sure you discuss any questions  you have with your health care provider. Document Released: 01/18/2015 Document Revised: 12/16/2017 Document Reviewed: 12/16/2017 Elsevier Patient Education  2020 [employer].

## 2018-12-08 ENCOUNTER — Inpatient Hospital Stay: Payer: Medicare Other

## 2018-12-08 ENCOUNTER — Inpatient Hospital Stay (HOSPITAL_BASED_OUTPATIENT_CLINIC_OR_DEPARTMENT_OTHER): Payer: Medicare Other | Admitting: Nurse Practitioner

## 2018-12-08 ENCOUNTER — Telehealth: Payer: Self-pay | Admitting: Oncology

## 2018-12-08 ENCOUNTER — Inpatient Hospital Stay: Payer: Medicare Other | Attending: Oncology

## 2018-12-08 ENCOUNTER — Encounter: Payer: Self-pay | Admitting: Nurse Practitioner

## 2018-12-08 ENCOUNTER — Other Ambulatory Visit: Payer: Self-pay

## 2018-12-08 VITALS — BP 144/59 | HR 85 | Temp 98.7°F | Resp 17 | Ht 70.0 in | Wt 143.1 lb

## 2018-12-08 DIAGNOSIS — Z95828 Presence of other vascular implants and grafts: Secondary | ICD-10-CM

## 2018-12-08 DIAGNOSIS — D649 Anemia, unspecified: Secondary | ICD-10-CM

## 2018-12-08 DIAGNOSIS — C7889 Secondary malignant neoplasm of other digestive organs: Secondary | ICD-10-CM | POA: Diagnosis not present

## 2018-12-08 DIAGNOSIS — I10 Essential (primary) hypertension: Secondary | ICD-10-CM | POA: Diagnosis not present

## 2018-12-08 DIAGNOSIS — C184 Malignant neoplasm of transverse colon: Secondary | ICD-10-CM | POA: Insufficient documentation

## 2018-12-08 DIAGNOSIS — C799 Secondary malignant neoplasm of unspecified site: Secondary | ICD-10-CM

## 2018-12-08 LAB — CBC WITH DIFFERENTIAL (CANCER CENTER ONLY)
Abs Immature Granulocytes: 0.06 10*3/uL (ref 0.00–0.07)
Basophils Absolute: 0.1 10*3/uL (ref 0.0–0.1)
Basophils Relative: 1 %
Eosinophils Absolute: 0 10*3/uL (ref 0.0–0.5)
Eosinophils Relative: 0 %
HCT: 33.9 % — ABNORMAL LOW (ref 39.0–52.0)
Hemoglobin: 10.7 g/dL — ABNORMAL LOW (ref 13.0–17.0)
Immature Granulocytes: 1 %
Lymphocytes Relative: 12 %
Lymphs Abs: 1.3 10*3/uL (ref 0.7–4.0)
MCH: 28.8 pg (ref 26.0–34.0)
MCHC: 31.6 g/dL (ref 30.0–36.0)
MCV: 91.4 fL (ref 80.0–100.0)
Monocytes Absolute: 0.9 10*3/uL (ref 0.1–1.0)
Monocytes Relative: 8 %
Neutro Abs: 9 10*3/uL — ABNORMAL HIGH (ref 1.7–7.7)
Neutrophils Relative %: 78 %
Platelet Count: 336 10*3/uL (ref 150–400)
RBC: 3.71 MIL/uL — ABNORMAL LOW (ref 4.22–5.81)
RDW: 16.4 % — ABNORMAL HIGH (ref 11.5–15.5)
WBC Count: 11.4 10*3/uL — ABNORMAL HIGH (ref 4.0–10.5)
nRBC: 0 % (ref 0.0–0.2)

## 2018-12-08 MED ORDER — HEPARIN SOD (PORK) LOCK FLUSH 100 UNIT/ML IV SOLN
500.0000 [IU] | Freq: Once | INTRAVENOUS | Status: AC
  Administered 2018-12-08: 500 [IU]
  Filled 2018-12-08: qty 5

## 2018-12-08 MED ORDER — SODIUM CHLORIDE 0.9% FLUSH
10.0000 mL | Freq: Once | INTRAVENOUS | Status: AC
  Administered 2018-12-08: 10 mL
  Filled 2018-12-08: qty 10

## 2018-12-08 NOTE — Progress Notes (Addendum)
[facility] OFFICE PROGRESS NOTE   Diagnosis: Colon cancer  INTERVAL HISTORY:   Cody Suarez returns as scheduled.  Cody Suarez feels well.  Cody Suarez has a good appetite.  Cody Suarez is gaining weight.  Cody Suarez denies pain.  Bowels moving regularly.  No nausea or vomiting.  No bleeding.  Objective:  Vital signs in last 24 hours:  Blood pressure (!) 144/59, pulse 85, temperature 98.7 F (37.1 C), temperature source Temporal, resp. rate 17, height _0  (1.778 m), weight 143 lb 1.6 oz (64.9 kg), SpO2 100 %.    HEENT: Neck without mass. Lymphatics: No palpable cervical, supraclavicular or axillary lymph nodes. GI: Abdomen soft and nontender.  No hepatomegaly.  No mass. Vascular: No leg edema.  Calves soft and nontender. Port-A-Cath without erythema.  Lab Results:  Lab Results  Component Value Date   WBC 11.4 (H) 12/08/2018   HGB 10.7 (L) 12/08/2018   HCT 33.9 (L) 12/08/2018   MCV 91.4 12/08/2018   PLT 336 12/08/2018   NEUTROABS 9.0 (H) 12/08/2018    Imaging:  No results found.  Medications: I have reviewed the patient's current medications.  Assessment/Plan: 1. 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 Negative for a BRAF mutation  Tumor invaded through the muscularis propria into pericolonic fatty tissue and involved the attached omentum.   Cycle 1 adjuvant Xeloda 05/28/2013.   Cycle 2 adjuvant Xeloda 06/18/2013.   Cycle 3 adjuvant Xeloda 07/09/2013.   Cycle 4 adjuvant Xeloda 07/30/2013.   Cycle 5 adjuvant Xeloda 08/20/2013.  Cycle 6 adjuvant Xeloda 09/11/2013.  cycle 7 adjuvant Xeloda 10/02/2013  Cycle 8 adjuvant Xeloda 10/23/2013  CTs of the chest, abdomen, and pelvis on 07/18/2014-negative for recurrent colon cancer   CTs abdomen/pelvis 09/06/2014 showed acute cholecystitis.   Colonoscopy 08/02/2015-chronic colitis, no malignancy  CTs 09/17/2015, new mass near the tail the pancreas, stable lung nodules  Biopsy mesenteric mass 10/04/2015 with metastatic adenocarcinoma consistent with colorectal primary  PET scan 10/10/2015 with soft tissue thickening within the peritoneal space of the left upper quadrant with SUV Max 7.9; no additional sites of peritoneal nodularity to suggest metastasis; hypermetabolic ill-defined nodule in the left lower lobe favored inflammatory.  Resection of the left abdominal mesenteric mass/tail the pancreas on 11/05/2015 with the pathology confirming metastatic colon cancer, positive "serosal" margin  CTs 07/29/2016-no evidence of metastatic disease, stable small lung nodules, decreased size of pancreatic resection bed fluid/mass  CTs 06/28/2017-decreased fluid and increased soft tissue at the area of the distal pancreatectomy with a few foci of gas  CT 09/29/2017-enlargement of left upper quadrant soft tissue mass between the stomach, spleen, and pancreas tail, stable nodules at the lung bases  CT biopsy of the upper quadrant mass 10/19/2017-metastatic colon cancer, MSS, tumor mutation burden 1, no KRAS, BRAF, or NRAS mutation  CTs 12/24/2017-complex left upper quadrant process involving the stomach, spleen, tail the pancreas, and distal transverse colon.  CT 04/27/2018-stable to mild progression of the left upper quadrant mass, compression of the stomach with no obstruction, no other evidence of metastatic disease, resolution of small fluid collection lateral to the left upper quadrant mass  CT 07/21/2018-enlargement of the left upper quadrant mass with involvement of the posterior stomach and spleen. Possible new right upper lung nodule  Cycle 1 FOLFOX 08/17/2018  Cycle 2 FOLFOX 08/31/2018, Udenyca added  Cycle 3 FOLFOX 09/14/2018, Udenyca  Cycle 4  FOLFOX 09/29/2018, oxaliplatin and 5-fluorouracil dose reduced 5-FU bolus held  CT  abdomen/pelvis 10/12/2018-decrease in size of mass at the pancreas tail/posterior gastric wall, and splenic flexure of the colon, mass is more cystic 2. Ulcerative colitis-extensive chronic active ulcerative colitis was noted on the colon resection specimen 03/28/2013.  Followed by Cody Suarez 3. Hypertension.  4. History of microcytic anemia-likely iron deficiency, unable to tolerate oral iron. Improved. 5. Possible area of cecal wall thickening noted on abdominal CT 03/20/2013. 6. Family history of colon cancer (maternal grandfather died in Cody Suarez 14s with colon cancer). 7. Bilateral calf and low anterior leg pain. Bilateral lower extremity venous duplex negative for DVT 07/14/2013. Right ABI with moderate, borderline severe arterial insufficiency; left ABI suggestive of moderate arterial insufficiency. Cody Suarez was referred to vascular. 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 TEE showed thrombus in the descending aorta. Cody Suarez underwent right popliteal to peroneal artery bypass graft, thrombectomy anterior tibialis and attempted thrombectomy tibioperoneal trunk and posterior tibialis on 08/11/2013. Right calf pain 09/26/2013 with findings of occlusion of right popliteal to peroneal bypass status post thrombolysis. Now on anticoagulation with Xarelto.  recurrent pain and ulceration at the right foot, status post a popliteal to posterior tibial artery bypass using a cephalic vein graft on 88/67/7373  Xarelto discontinued due to high risk for bleeding and aspirin 81 mg daily initiated hospitalization July 2020 8. Status post laparoscopic cholecystectomy 09/08/2014 9. Anorexia following the mesenteric mass/pancreas tail resection-improved 10. Abdominal abscess November 2017-pancreas "cysts "drainage catheter placed 11/26/2015; CT 02/04/2016 with no change to slight decreasein size of residual pancreatic tail fluid collection.Drainage catheter removed approximately early March 2018  CT  02/20/2016-new small left pleural effusion, increased size of the surgical bed fluid collection, indeterminate pulmonary nodules  CT 07/29/2016-decreased fluid/soft tissue density near the pancreas is no evidence of metastatic disease, stable subcentimeter lung nodules 11.Admission 12/24/2017 with an abdominal abscess, status post placement of a percutaneous drain and antibiotic therapy; CTs abdomen/pelvis 01/04/2018-near complete drainage of previously noted complex fluid collection in the left upper quadrant. 2 small residual collections remain. Residual soft tissue mass at the clips concerning for residual recurrent neoplasm. Peritoneal wall thickening also concerning for metastatic disease. Drain removed 01/04/2018  CT 01/18/2018-no change in soft tissue mass at the tail of pancreas with involvement of the gastric fundus and spleen, there is central low attenuation  Admission 01/20/2018 with fever and failure to thrive, no drainable fluid collection, placed on IV antibiotics, discharged to complete an outpatient course of IV ceftriaxone and Flagyl completed 02/20/2018  12.Admission 7/26/2020transient speech abnormality and microcytic anemia  13.Port-A-Cath placement, Cody Suarez, 08/15/2018  Disposition: Cody Suarez appears stable.  There is no clinical evidence of disease progression.  Cody Suarez continues to have a good performance status.  Cody Suarez again confirms Cody Suarez decision against further chemotherapy.  We reviewed the CBC from today.  Hemoglobin is better.  Cody Suarez will return for a Port-A-Cath flush and follow-up visit in 7 to 8 weeks.  Patient seen with Cody Suarez.    Cody Suarez ANP/GNP-BC   12/08/2018  12:08 PM  This was a shared visit with Cody Suarez.  Mr. Albor appears stable.  Cody Suarez does not wish to resume chemotherapy at present.  Cody Suarez will keep the Port-A-Cath in place.  Cody Manson, MD

## 2018-12-08 NOTE — Telephone Encounter (Signed)
Gave avs and calendar ° °

## 2018-12-13 ENCOUNTER — Other Ambulatory Visit: Payer: Self-pay

## 2018-12-13 ENCOUNTER — Encounter: Payer: Self-pay | Admitting: Cardiovascular Disease

## 2018-12-13 ENCOUNTER — Ambulatory Visit (INDEPENDENT_AMBULATORY_CARE_PROVIDER_SITE_OTHER): Payer: Medicare Other | Admitting: Cardiovascular Disease

## 2018-12-13 VITALS — BP 153/73 | HR 89 | Temp 97.0°F | Ht 70.0 in | Wt 146.0 lb

## 2018-12-13 DIAGNOSIS — I10 Essential (primary) hypertension: Secondary | ICD-10-CM | POA: Diagnosis not present

## 2018-12-13 DIAGNOSIS — I739 Peripheral vascular disease, unspecified: Secondary | ICD-10-CM | POA: Diagnosis not present

## 2018-12-13 MED ORDER — LOSARTAN POTASSIUM 100 MG PO TABS: 100.0000 mg | ORAL_TABLET | Freq: Every day | ORAL | 1 refills | Status: DC

## 2018-12-13 NOTE — Progress Notes (Signed)
Cardiology Office Note   Date:  12/13/2018   ID:  Randol Zumstein., DOB 1945/06/10, MRN [id number]  PCP:  Midge Minium, MD  Cardiologist:   Kathlyn Sacramento, MD   No chief complaint on file.     History of Present Illness: Kmarion Rawl. is a 73 y.o. male who presents a follow-up visit regarding  peripheral arterial disease and hypertension.  He was diagnosed with colon cancer in 2015 and underwent partial colectomy followed by chemotherapy. He was seen in July 2015 for bilateral calf discomfort. Angiography was done in August 2015 which showed: 1. No significant aortoiliac disease. 2. 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 One-vessel runoff below the knee bilaterally via the peroneal artery which was occluded proximally but did reconstitute via collaterals. TEE was performed to evaluate source of embolism. It showed normal LV systolic function and wall motion. There was a mobile thrombus in the proximal decending aorta on top of an ulcerated plaque. CTA showed an ulcerated plaque in the proximal descending aorta with thrombus. He was evaluated by Dr. Trula Slade. Anticoagulation was recommended . He underwent right SFA to posterior tibial bypass by Dr. Kellie Simmering multiple times with poor outcome due to poor runoff. He was hospitalized in August of this year with speech abnormality with poor oral intake and weight loss.  CT scan of the abdomen showed findings suspicious for progressive tumor infiltration.  He was noted to be severely anemic with a hemoglobin of 6.2.  He was transfused.  Echo showed normal ejection fraction.  No obvious source of bleeding was identified.  Xarelto was discontinued. He received chemotherapy for colon cancer and it seems like he responded very well.  He feels significantly better and started gaining weight again.  No chest pain, shortness of breath or leg claudication.   Past Medical  History:  Diagnosis Date  . Anemia of chronic disease 2015   "related to cancer tx" (08/09/2013)  . Blood clot in vein 2015  . Colon cancer ([place]) 03/2013  . History of kidney stones    x 2passed them both  . Hypertension   . Peripheral vascular disease ([place])   . Ulcerative colitis ([place])    history    Past Surgical History:  Procedure Laterality Date  . ABDOMINAL AORTAGRAM N/A 08/09/2013   Procedure: ABDOMINAL Maxcine Ham;  Surgeon: [name], MD;  Location: [facility] CATH [facility];  Service: Cardiovascular;  Laterality: N/A;  . CHOLECYSTECTOMY N/A 09/08/2014   Procedure: LAPAROSCOPIC CHOLECYSTECTOMY WITH INTRAOPERATIVE CHOLANGIOGRAM;  Surgeon: Armandina Gemma, MD;  Location: [facility];  Service: General;  Laterality: N/A;  . COLON SURGERY  02/2013  . COLON SURGERY  10/2015  . FEMORAL-POPLITEAL BYPASS GRAFT Right 08/11/2013   Procedure:   RIGHT - POPLITEAL TO PERONEAL ARTERY BYPASS GRAFT  WITH NONREVERSED SAPHENOUS VEIN GRAFT,tHROMBECTOMY ANTERIOR TIBIALIS,ATTEMPTED THROMBECTOMY TIBIO-PERONEAL TRUNK AND POSTERIOR TIBIALIS, INTRAOPERATIVE ARTERIOGRAM.;  Surgeon: Mal Misty, MD;  Location: [facility];  Service: Vascular;  Laterality: Right;  . FEMORAL-TIBIAL BYPASS GRAFT Right 11/17/2013   Procedure: BYPASS GRAFT RIGHT ABOVE KNEE POPLITEAL TO POSTERIOR TIBIAL ARTERY USING RIGHT NON-REVERSED CEPHALIC VEIN;  Surgeon: Mal Misty, MD;  Location: [facility];  Service: Vascular;  Laterality: Right;  . HERNIA REPAIR  ~ 1995; 03/2013   UHR  . INTRAOPERATIVE ARTERIOGRAM Right 11/17/2013   Procedure: INTRA OPERATIVE ARTERIOGRAM;  Surgeon: Mal Misty, MD;  Location: [facility];  Service: Vascular;  Laterality: Right;  . IR GENERIC HISTORICAL  12/11/2015   IR RADIOLOGIST EVAL & MGMT 12/11/2015 Arne Cleveland, MD [facility]  . IR GENERIC HISTORICAL  12/24/2015   IR RADIOLOGIST EVAL & MGMT 12/24/2015 Jacqulynn Cadet, MD [facility]  . IR GENERIC HISTORICAL  01/21/2016   IR RADIOLOGIST EVAL & MGMT 01/21/2016 [name], MD [facility]  . IR GENERIC HISTORICAL  02/04/2016   IR RADIOLOGIST EVAL & MGMT 02/04/2016 Sandi Mariscal, MD [facility]  . IR GENERIC HISTORICAL  02/12/2016   IR CATHETER TUBE CHANGE 02/12/2016 Corrie Mckusick, DO [facility]  . IR GENERIC HISTORICAL  02/20/2016   IR RADIOLOGIST EVAL & MGMT 02/20/2016 Markus Daft, MD [facility] INTER[facility]  . IR SINUS/FIST TUBE CHK-NON GI  01/04/2018  . LAPAROSCOPIC RIGHT HEMI COLECTOMY N/A 03/28/2013   Procedure: LAPAROSCOPIC ASSISTED HEMI COLECTOMY;  Surgeon: Pedro Earls, MD;  Location: [facility];  Service: General;  Laterality: N/A;  . LAPAROSCOPY N/A 11/05/2015   Procedure: LAPAROSCOPY DIAGNOSTIC;  Surgeon: [name], MD;  Location: [facility];  Service: General;  Laterality: N/A;  . LAPAROTOMY N/A 11/05/2015   Procedure: EXPLORATORY LAPAROTOMY, resection of mass at tail of pancreas;  Surgeon: [name], MD;  Location: [facility];  Service: General;  Laterality: N/A;  . LOWER EXTREMITY ANGIOGRAM Bilateral 08/09/2013  . PORTACATH PLACEMENT Left 08/15/2018   Procedure: PORT A CATH PLACEMENT;  Surgeon: [name], MD;  Location: [facility];  Service: General;  Laterality: Left;  . TEE WITHOUT CARDIOVERSION N/A 08/10/2013   Procedure: TRANSESOPHAGEAL ECHOCARDIOGRAM (TEE);  Surgeon: Candee Furbish, MD;  Location: [facility] ENDOSCOPY;  Service: Cardiovascular;  Laterality: N/A;  . TONSILLECTOMY  ~ 1950  . Axis; 03/2013  . VASECTOMY    . VEIN HARVEST Right 11/17/2013   Procedure: HARVEST OF RIGHT UPPER EXTREMITY CEPHALIC VEIN;  Surgeon: Mal Misty, MD;  Location: [facility];  Service: Vascular;  Laterality: Right;     Current Outpatient Medications  Medication Sig Dispense Refill  . acetaminophen (TYLENOL) 500 MG tablet Take 1,000 mg by mouth daily as needed for mild pain or headache.     Marland Kitchen aspirin EC 81 MG tablet Take 1 tablet (81 mg total) by mouth daily. 90 tablet 3  . calcium carbonate (TUMS EX) 750 MG chewable tablet Chew 1-2 tablets by  mouth daily as needed for heartburn.    . ferrous sulfate 325 (65 FE) MG tablet Take 1 tablet (325 mg total) by mouth daily with breakfast. 90 tablet 2  . Homeopathic Products (THERAWORX RELIEF EX) Apply 1 application topically daily as needed (calf pain).    Marland Kitchen HYDROcodone-acetaminophen (NORCO/VICODIN) 5-325 MG tablet Take 1 tablet by mouth every 6 (six) hours as needed for moderate pain. 15 tablet 0  . lidocaine-prilocaine (EMLA) cream Apply 1 tsp to port site 1 hour prior to stick and cover with plastic wrap 30 g 1  . Liniments (BLUE-EMU SUPER STRENGTH EX) Apply 1 application topically daily as needed (knee pain).    Marland Kitchen losartan (COZAAR) 100 MG tablet Take 0.5 tablets (50 mg total) by mouth daily. 45 tablet 1  . potassium chloride SA (K-DUR) 20 MEQ tablet Take 1 tablet (20 mEq total) by mouth 2 (two) times daily. 60 tablet 1  . predniSONE (DELTASONE) 10 MG tablet Take 1 tablet (10 mg total) by mouth daily with breakfast. 30 tablet 2  . prochlorperazine (COMPAZINE) 10 MG tablet Take 1 tablet (10 mg total) by  mouth every 6 (six) hours as needed for nausea. 60 tablet 1  . spironolactone (ALDACTONE) 25 MG tablet Take 1 tablet by mouth once daily 90 tablet 0  . sulfaSALAzine (AZULFIDINE) 500 MG EC tablet Take 1,500 mg by mouth daily.   3  . vitamin B-12 (CYANOCOBALAMIN) 1000 MCG tablet Take 1,000 mcg by mouth daily.    Marland Kitchen zolpidem (AMBIEN) 5 MG tablet Take 1 tablet (5 mg total) by mouth at bedtime as needed for sleep. 30 tablet 2   No current facility-administered medications for this visit.     Allergies:   Amlodipine and Gabapentin    Social History:  The patient  reports that he has never smoked. He has never used smokeless tobacco. He reports that he does not drink alcohol or use drugs.   Family History:  The patient's family history includes Emphysema in his father; Heart disease in his mother.    ROS:  Please see the history of present illness.   Otherwise, review of systems are positive  for none.   All other systems are reviewed and negative.    PHYSICAL EXAM: VS:  BP (!) 153/73   Pulse 89   Temp (!) 97 F (36.1 C)   Ht 5' 10"  (1.778 m)   Wt 146 lb (66.2 kg)   SpO2 98%   BMI 20.95 kg/m  , BMI Body mass index is 20.95 kg/m. GEN: Underweight. HEENT: normal  Neck: no JVD, carotid bruits, or masses Cardiac: RRR; no murmurs, rubs, or gallops, no edema. Respiratory:  clear to auscultation bilaterally, normal work of breathing GI: soft, nontender, nondistended, + BS MS: no deformity or atrophy  Skin: warm and dry, no rash Neuro:  Strength and sensation are intact Psych: Emotional and tearful.   EKG:  EKG is not ordered today.   Recent Labs: 10/12/2018: ALT 9; BUN 10; Creatinine 0.87; Potassium 3.8; Sodium 134 12/08/2018: Hemoglobin 10.7; Platelet Count 336    Lipid Panel    Component Value Date/Time   CHOL 93 08/01/2018 0445   CHOL 132 11/05/2016 0855   TRIG 101 08/01/2018 0445   HDL 18 (L) 08/01/2018 0445   HDL 42 11/05/2016 0855   CHOLHDL 5.2 08/01/2018 0445   VLDL 20 08/01/2018 0445   LDLCALC 55 08/01/2018 0445   LDLCALC 59 11/05/2016 0855   LDLDIRECT 91.0 11/12/2017 1353      Wt Readings from Last 3 Encounters:  12/13/18 146 lb (66.2 kg)  12/08/18 143 lb 1.6 oz (64.9 kg)  11/15/18 134 lb (60.8 kg)       ASSESSMENT AND PLAN:   1. Peripheral arterial disease with occluded tibial vessels due to embolization from ulcerative plaque in the aorta. He has no claudication at the present time. Continue medical therapy.  Xarelto has been stopped in August due to significant anemia.  Since then, he has been on low-dose aspirin and he is tolerating the medication.  I discussed with him the option of repeating CTA of the aorta to see if he still has an ulcerated plaque to justify resuming anticoagulation.  The patient reports that he had multiple testing recently and feels very well and prefers to continue current treatment with low-dose aspirin.  I think  that is reasonable for the time being and if he develops any recurrent thromboembolic events, he will require lifelong anticoagulation.  2.  Chronic venous insufficiency: Leg swelling and edema improved with conservative measures.  He is no longer on torsemide.  3. Essential hypertension: Blood pressure  is now running high again.  I elected to increase losartan to 100 mg daily.  4.  Colon cancer: Responded well to recent chemotherapy.   Disposition:   FU with me in 6 months  Signed,  Kathlyn Sacramento, MD  12/13/2018 11:15 AM    [place]

## 2018-12-13 NOTE — Patient Instructions (Addendum)
Medication Instructions:  INCREASE YOUR LOSARTAN TO 100 MG DAILY   *If you need a refill on your cardiac medications before your next appointment, please call your pharmacy*  Lab Work: NONE   Testing/Procedures: NONE  Follow-Up: At [facility], you and your health needs are our priority.  As part of our continuing mission to provide you with exceptional heart care, we have created designated Provider Care Teams.  These Care Teams include your primary Cardiologist (physician) and Advanced Practice Providers (APPs -  Physician Assistants and Nurse Practitioners) who all work together to provide you with the care you need, when you need it.  Your next appointment:   6 month(s) You will receive a reminder letter in the mail two months in advance. If you don't receive a letter, please call our office to schedule the follow-up appointment.  The format for your next appointment:   In Person  Provider:   You may see Kathlyn Sacramento, MD or one of the following Advanced Practice Providers on your designated Care Team:    Kerin Ransom, PA-C  [place], [place]  Coletta Memos, [place]

## 2019-01-10 ENCOUNTER — Other Ambulatory Visit: Payer: Self-pay | Admitting: Oncology

## 2019-01-10 ENCOUNTER — Other Ambulatory Visit: Payer: Self-pay | Admitting: Cardiovascular Disease

## 2019-01-11 ENCOUNTER — Other Ambulatory Visit: Payer: Self-pay | Admitting: *Deleted

## 2019-01-11 DIAGNOSIS — C7889 Secondary malignant neoplasm of other digestive organs: Secondary | ICD-10-CM

## 2019-01-11 MED ORDER — POTASSIUM CHLORIDE CRYS ER 20 MEQ PO TBCR: 20.0000 meq | EXTENDED_RELEASE_TABLET | Freq: Two times a day (BID) | ORAL | 1 refills | Status: AC

## 2019-02-02 ENCOUNTER — Inpatient Hospital Stay: Payer: Medicare Other

## 2019-02-02 ENCOUNTER — Telehealth: Payer: Self-pay | Admitting: Oncology

## 2019-02-02 ENCOUNTER — Inpatient Hospital Stay (HOSPITAL_BASED_OUTPATIENT_CLINIC_OR_DEPARTMENT_OTHER): Payer: Medicare Other | Admitting: Oncology

## 2019-02-02 ENCOUNTER — Other Ambulatory Visit: Payer: Self-pay

## 2019-02-02 ENCOUNTER — Inpatient Hospital Stay: Payer: Medicare Other | Attending: Oncology

## 2019-02-02 VITALS — BP 144/60 | HR 83 | Temp 98.3°F | Resp 18 | Ht 70.0 in | Wt 158.1 lb

## 2019-02-02 DIAGNOSIS — Z95828 Presence of other vascular implants and grafts: Secondary | ICD-10-CM

## 2019-02-02 DIAGNOSIS — C184 Malignant neoplasm of transverse colon: Secondary | ICD-10-CM | POA: Insufficient documentation

## 2019-02-02 DIAGNOSIS — C799 Secondary malignant neoplasm of unspecified site: Secondary | ICD-10-CM | POA: Diagnosis not present

## 2019-02-02 DIAGNOSIS — I10 Essential (primary) hypertension: Secondary | ICD-10-CM | POA: Diagnosis not present

## 2019-02-02 DIAGNOSIS — C7889 Secondary malignant neoplasm of other digestive organs: Secondary | ICD-10-CM

## 2019-02-02 DIAGNOSIS — C7989 Secondary malignant neoplasm of other specified sites: Secondary | ICD-10-CM | POA: Diagnosis not present

## 2019-02-02 LAB — CBC WITH DIFFERENTIAL (CANCER CENTER ONLY)
Abs Immature Granulocytes: 0.11 10*3/uL — ABNORMAL HIGH (ref 0.00–0.07)
Basophils Absolute: 0.2 10*3/uL — ABNORMAL HIGH (ref 0.0–0.1)
Basophils Relative: 1 %
Eosinophils Absolute: 0.2 10*3/uL (ref 0.0–0.5)
Eosinophils Relative: 1 %
HCT: 28.6 % — ABNORMAL LOW (ref 39.0–52.0)
Hemoglobin: 9 g/dL — ABNORMAL LOW (ref 13.0–17.0)
Immature Granulocytes: 1 %
Lymphocytes Relative: 7 %
Lymphs Abs: 1.1 10*3/uL (ref 0.7–4.0)
MCH: 26.5 pg (ref 26.0–34.0)
MCHC: 31.5 g/dL (ref 30.0–36.0)
MCV: 84.1 fL (ref 80.0–100.0)
Monocytes Absolute: 1.2 10*3/uL — ABNORMAL HIGH (ref 0.1–1.0)
Monocytes Relative: 7 %
Neutro Abs: 13.6 10*3/uL — ABNORMAL HIGH (ref 1.7–7.7)
Neutrophils Relative %: 83 %
Platelet Count: 375 10*3/uL (ref 150–400)
RBC: 3.4 MIL/uL — ABNORMAL LOW (ref 4.22–5.81)
RDW: 16.5 % — ABNORMAL HIGH (ref 11.5–15.5)
WBC Count: 16.4 10*3/uL — ABNORMAL HIGH (ref 4.0–10.5)
nRBC: 0 % (ref 0.0–0.2)

## 2019-02-02 MED ORDER — HEPARIN SOD (PORK) LOCK FLUSH 100 UNIT/ML IV SOLN
500.0000 [IU] | Freq: Once | INTRAVENOUS | Status: AC
  Administered 2019-02-02: 12:00:00 500 [IU]
  Filled 2019-02-02: qty 5

## 2019-02-02 MED ORDER — SODIUM CHLORIDE 0.9% FLUSH
10.0000 mL | Freq: Once | INTRAVENOUS | Status: AC
  Administered 2019-02-02: 12:00:00 10 mL
  Filled 2019-02-02: qty 10

## 2019-02-02 NOTE — Progress Notes (Signed)
[place] OFFICE PROGRESS NOTE   Diagnosis: Colon cancer  INTERVAL HISTORY:   Cody Suarez returns for a scheduled visit.  He feels well.  Good appetite.  No bleeding.  He is getting out of the house.  Objective:  Vital signs in last 24 hours:  Blood pressure (!) 144/60, pulse 83, temperature 98.3 F (36.8 C), temperature source Temporal, resp. rate 18, height 5' 10"  (1.778 m), weight 158 lb 1.6 oz (71.7 kg), SpO2 100 %.    Limited physical examination secondary to distancing with the Covid pandemic GI: No hepatomegaly, no mass, nontender Vascular: No leg edema   Portacath/PICC-without erythema  Lab Results:  Lab Results  Component Value Date   WBC 16.4 (H) 02/02/2019   HGB 9.0 (L) 02/02/2019   HCT 28.6 (L) 02/02/2019   MCV 84.1 02/02/2019   PLT 375 02/02/2019   NEUTROABS 13.6 (H) 02/02/2019    CMP  Lab Results  Component Value Date   NA 134 (L) 10/12/2018   K 3.8 10/12/2018   CL 104 10/12/2018   CO2 20 (L) 10/12/2018   GLUCOSE 123 (H) 10/12/2018   BUN 10 10/12/2018   CREATININE 0.87 10/12/2018   CALCIUM 8.9 10/12/2018   PROT 6.8 10/12/2018   ALBUMIN 3.3 (L) 10/12/2018   AST 29 10/12/2018   ALT 9 10/12/2018   ALKPHOS 192 (H) 10/12/2018   BILITOT 0.3 10/12/2018   GFRNONAA >60 10/12/2018   GFRAA >60 10/12/2018    Lab Results  Component Value Date   CEA1 2.72 09/29/2018     Medications: I have reviewed the patient's current medications.   Assessment/Plan: 1. 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 Negative for a BRAF mutation  Tumor invaded through the muscularis propria into pericolonic fatty tissue and involved the attached omentum.   Cycle 1 adjuvant Xeloda 05/28/2013.   Cycle 2 adjuvant Xeloda 06/18/2013.   Cycle 3 adjuvant Xeloda 07/09/2013.    Cycle 4 adjuvant Xeloda 07/30/2013.   Cycle 5 adjuvant Xeloda 08/20/2013.  Cycle 6 adjuvant Xeloda 09/11/2013.  cycle 7 adjuvant Xeloda 10/02/2013  Cycle 8 adjuvant Xeloda 10/23/2013  CTs of the chest, abdomen, and pelvis on 07/18/2014-negative for recurrent colon cancer   CTs abdomen/pelvis 09/06/2014 showed acute cholecystitis.  Colonoscopy 08/02/2015-chronic colitis, no malignancy  CTs 09/17/2015, new mass near the tail the pancreas, stable lung nodules  Biopsy mesenteric mass 10/04/2015 with metastatic adenocarcinoma consistent with colorectal primary  PET scan 10/10/2015 with soft tissue thickening within the peritoneal space of the left upper quadrant with SUV Max 7.9; no additional sites of peritoneal nodularity to suggest metastasis; hypermetabolic ill-defined nodule in the left lower lobe favored inflammatory.  Resection of the left abdominal mesenteric mass/tail the pancreas on 11/05/2015 with the pathology confirming metastatic colon cancer, positive "serosal" margin  CTs 07/29/2016-no evidence of metastatic disease, stable small lung nodules, decreased size of pancreatic resection bed fluid/mass  CTs 06/28/2017-decreased fluid and increased soft tissue at the area of the distal pancreatectomy with a few foci of gas  CT 09/29/2017-enlargement of left upper quadrant soft tissue mass between the stomach, spleen, and pancreas tail, stable nodules at the lung bases  CT biopsy of the upper quadrant mass 10/19/2017-metastatic colon cancer, MSS, tumor mutation burden 1, no KRAS, BRAF, or NRAS mutation  CTs 12/24/2017-complex left upper quadrant process involving the stomach, spleen, tail the pancreas, and distal  transverse colon.  CT 04/27/2018-stable to mild progression of the left upper quadrant mass, compression of the stomach with no obstruction, no other evidence of metastatic disease, resolution of small fluid collection lateral to the left upper quadrant mass  CT  07/21/2018-enlargement of the left upper quadrant mass with involvement of the posterior stomach and spleen. Possible new right upper lung nodule  Cycle 1 FOLFOX 08/17/2018  Cycle 2 FOLFOX 08/31/2018, Udenyca added  Cycle 3 FOLFOX 09/14/2018, Udenyca  Cycle 4 FOLFOX 09/29/2018, oxaliplatin and 5-fluorouracil dose reduced 5-FU bolus held  CT abdomen/pelvis 10/12/2018-decrease in size of mass at the pancreas tail/posterior gastric wall, and splenic flexure of the colon, mass is more cystic 2. Ulcerative colitis-extensive chronic active ulcerative colitis was noted on the colon resection specimen 03/28/2013.  Followed by Dr.Magod 3. Hypertension.  4. History of microcytic anemia-likely iron deficiency, unable to tolerate oral iron. Improved. 5. Possible area of cecal wall thickening noted on abdominal CT 03/20/2013. 6. Family history of colon cancer (maternal grandfather died in his 14s with colon cancer). 7. Bilateral calf and low anterior leg pain. Bilateral lower extremity venous duplex negative for DVT 07/14/2013. Right ABI with moderate, borderline severe arterial insufficiency; left ABI suggestive of moderate arterial insufficiency. He was referred to vascular. 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 TEE showed thrombus in the descending aorta. He underwent right popliteal to peroneal artery bypass graft, thrombectomy anterior tibialis and attempted thrombectomy tibioperoneal trunk and posterior tibialis on 08/11/2013. Right calf pain 09/26/2013 with findings of occlusion of right popliteal to peroneal bypass status post thrombolysis. Now on anticoagulation with Xarelto.  recurrent pain and ulceration at the right foot, status post a popliteal to posterior tibial artery bypass using a cephalic vein graft on 32/95/1884  Xarelto discontinued due to high risk for bleeding and aspirin 81 mg daily initiated hospitalization July 2020 8. Status post laparoscopic  cholecystectomy 09/08/2014 9. Anorexia following the mesenteric mass/pancreas tail resection-improved 10. Abdominal abscess November 2017-pancreas "cysts "drainage catheter placed 11/26/2015; CT 02/04/2016 with no change to slight decreasein size of residual pancreatic tail fluid collection.Drainage catheter removed approximately early March 2018  CT 02/20/2016-new small left pleural effusion, increased size of the surgical bed fluid collection, indeterminate pulmonary nodules  CT 07/29/2016-decreased fluid/soft tissue density near the pancreas is no evidence of metastatic disease, stable subcentimeter lung nodules 11.Admission 12/24/2017 with an abdominal abscess, status post placement of a percutaneous drain and antibiotic therapy; CTs abdomen/pelvis 01/04/2018-near complete drainage of previously noted complex fluid collection in the left upper quadrant. 2 small residual collections remain. Residual soft tissue mass at the clips concerning for residual recurrent neoplasm. Peritoneal wall thickening also concerning for metastatic disease. Drain removed 01/04/2018  CT 01/18/2018-no change in soft tissue mass at the tail of pancreas with involvement of the gastric fundus and spleen, there is central low attenuation  Admission 01/20/2018 with fever and failure to thrive, no drainable fluid collection, placed on IV antibiotics, discharged to complete an outpatient course of IV ceftriaxone and Flagyl completed 02/20/2018  12.Admission 7/26/2020transient speech abnormality and microcytic anemia  13.Port-A-Cath placement, Dr. Hassell Done, 08/15/2018    Disposition: Cody Suarez appears unchanged.  The hemoglobin is lower.  He denies bleeding and does not have symptoms of anemia.  I suspect he has chronic GI bleeding related to the abdominal tumor burden.  He will call for symptoms of anemia.  Cody Suarez would like to continue with observation for the metastatic colon cancer.  He will return for  a lab  visit in 3 weeks and an office visit/Port-A-Cath flush in 6 weeks.  Betsy Coder, MD  02/02/2019  12:36 PM

## 2019-02-02 NOTE — Patient Instructions (Signed)

## 2019-02-02 NOTE — Telephone Encounter (Signed)
Scheduled per los. Gave avs and calendar  

## 2019-02-07 ENCOUNTER — Other Ambulatory Visit: Payer: Self-pay | Admitting: Oncology

## 2019-02-09 ENCOUNTER — Telehealth: Payer: Self-pay | Admitting: *Deleted

## 2019-02-09 DIAGNOSIS — Z23 Encounter for immunization: Secondary | ICD-10-CM | POA: Diagnosis not present

## 2019-02-09 NOTE — Telephone Encounter (Signed)
Mr Fearnow states he received his Covid vaccine today.   Would like to take "last" chemo treatment as he has picked up weight and is feeling better. Wants to know if he can take the treatment when he comes for lab and flush on 2/18.

## 2019-02-10 ENCOUNTER — Telehealth: Payer: Self-pay | Admitting: *Deleted

## 2019-02-10 NOTE — Telephone Encounter (Signed)
Called patient back to report that per Dr. Benay Spice, doing the last treatment this far out would not be of any benefit. Could have benefit if he agreed to do 4-5 cycles. Patient did not want to pursue this.

## 2019-02-23 ENCOUNTER — Inpatient Hospital Stay: Payer: Medicare Other

## 2019-02-27 ENCOUNTER — Telehealth: Payer: Self-pay | Admitting: *Deleted

## 2019-02-27 ENCOUNTER — Inpatient Hospital Stay: Payer: Medicare Other

## 2019-02-27 ENCOUNTER — Inpatient Hospital Stay: Payer: Medicare Other | Attending: Oncology

## 2019-02-27 ENCOUNTER — Other Ambulatory Visit: Payer: Self-pay

## 2019-02-27 DIAGNOSIS — C7889 Secondary malignant neoplasm of other digestive organs: Secondary | ICD-10-CM | POA: Diagnosis not present

## 2019-02-27 DIAGNOSIS — C799 Secondary malignant neoplasm of unspecified site: Secondary | ICD-10-CM

## 2019-02-27 DIAGNOSIS — Z95828 Presence of other vascular implants and grafts: Secondary | ICD-10-CM

## 2019-02-27 DIAGNOSIS — C184 Malignant neoplasm of transverse colon: Secondary | ICD-10-CM | POA: Diagnosis not present

## 2019-02-27 LAB — CBC WITH DIFFERENTIAL (CANCER CENTER ONLY)
Abs Immature Granulocytes: 0.15 10*3/uL — ABNORMAL HIGH (ref 0.00–0.07)
Basophils Absolute: 0.2 10*3/uL — ABNORMAL HIGH (ref 0.0–0.1)
Basophils Relative: 1 %
Eosinophils Absolute: 0.2 10*3/uL (ref 0.0–0.5)
Eosinophils Relative: 1 %
HCT: 29.4 % — ABNORMAL LOW (ref 39.0–52.0)
Hemoglobin: 9.2 g/dL — ABNORMAL LOW (ref 13.0–17.0)
Immature Granulocytes: 1 %
Lymphocytes Relative: 6 %
Lymphs Abs: 1 10*3/uL (ref 0.7–4.0)
MCH: 25.8 pg — ABNORMAL LOW (ref 26.0–34.0)
MCHC: 31.3 g/dL (ref 30.0–36.0)
MCV: 82.6 fL (ref 80.0–100.0)
Monocytes Absolute: 1.3 10*3/uL — ABNORMAL HIGH (ref 0.1–1.0)
Monocytes Relative: 8 %
Neutro Abs: 13 10*3/uL — ABNORMAL HIGH (ref 1.7–7.7)
Neutrophils Relative %: 83 %
Platelet Count: 387 10*3/uL (ref 150–400)
RBC: 3.56 MIL/uL — ABNORMAL LOW (ref 4.22–5.81)
RDW: 16.6 % — ABNORMAL HIGH (ref 11.5–15.5)
WBC Count: 15.7 10*3/uL — ABNORMAL HIGH (ref 4.0–10.5)
nRBC: 0 % (ref 0.0–0.2)

## 2019-02-27 LAB — SAMPLE TO BLOOD BANK

## 2019-02-27 MED ORDER — HEPARIN SOD (PORK) LOCK FLUSH 100 UNIT/ML IV SOLN
500.0000 [IU] | Freq: Once | INTRAVENOUS | Status: AC
  Administered 2019-02-27: 500 [IU]
  Filled 2019-02-27: qty 5

## 2019-02-27 MED ORDER — SODIUM CHLORIDE 0.9% FLUSH
10.0000 mL | Freq: Once | INTRAVENOUS | Status: AC
  Administered 2019-02-27: 12:00:00 10 mL
  Filled 2019-02-27: qty 10

## 2019-02-27 NOTE — Telephone Encounter (Signed)
Notified of stable CBC results. He reports feeling fine and does not want blood. Will f/u on 03/16/19 as scheduled.

## 2019-03-10 DIAGNOSIS — Z23 Encounter for immunization: Secondary | ICD-10-CM | POA: Diagnosis not present

## 2019-03-16 ENCOUNTER — Inpatient Hospital Stay: Payer: Medicare Other

## 2019-03-16 ENCOUNTER — Other Ambulatory Visit: Payer: Self-pay

## 2019-03-16 ENCOUNTER — Inpatient Hospital Stay: Payer: Medicare Other | Attending: Oncology | Admitting: Oncology

## 2019-03-16 ENCOUNTER — Other Ambulatory Visit: Payer: Self-pay | Admitting: *Deleted

## 2019-03-16 ENCOUNTER — Telehealth: Payer: Self-pay | Admitting: Oncology

## 2019-03-16 VITALS — BP 144/64 | HR 99 | Temp 99.1°F | Resp 16 | Ht 70.0 in | Wt 156.1 lb

## 2019-03-16 VITALS — BP 149/61 | HR 80 | Resp 18

## 2019-03-16 DIAGNOSIS — R630 Anorexia: Secondary | ICD-10-CM | POA: Diagnosis not present

## 2019-03-16 DIAGNOSIS — D509 Iron deficiency anemia, unspecified: Secondary | ICD-10-CM | POA: Diagnosis not present

## 2019-03-16 DIAGNOSIS — R0600 Dyspnea, unspecified: Secondary | ICD-10-CM | POA: Insufficient documentation

## 2019-03-16 DIAGNOSIS — C7889 Secondary malignant neoplasm of other digestive organs: Secondary | ICD-10-CM | POA: Diagnosis not present

## 2019-03-16 DIAGNOSIS — R1012 Left upper quadrant pain: Secondary | ICD-10-CM | POA: Insufficient documentation

## 2019-03-16 DIAGNOSIS — R079 Chest pain, unspecified: Secondary | ICD-10-CM | POA: Diagnosis not present

## 2019-03-16 DIAGNOSIS — C799 Secondary malignant neoplasm of unspecified site: Secondary | ICD-10-CM

## 2019-03-16 DIAGNOSIS — C184 Malignant neoplasm of transverse colon: Secondary | ICD-10-CM | POA: Diagnosis not present

## 2019-03-16 DIAGNOSIS — Z95828 Presence of other vascular implants and grafts: Secondary | ICD-10-CM

## 2019-03-16 DIAGNOSIS — R110 Nausea: Secondary | ICD-10-CM | POA: Diagnosis not present

## 2019-03-16 LAB — CBC WITH DIFFERENTIAL (CANCER CENTER ONLY)
Abs Immature Granulocytes: 0.11 10*3/uL — ABNORMAL HIGH (ref 0.00–0.07)
Basophils Absolute: 0.1 10*3/uL (ref 0.0–0.1)
Basophils Relative: 1 %
Eosinophils Absolute: 0.1 10*3/uL (ref 0.0–0.5)
Eosinophils Relative: 1 %
HCT: 27.3 % — ABNORMAL LOW (ref 39.0–52.0)
Hemoglobin: 8.4 g/dL — ABNORMAL LOW (ref 13.0–17.0)
Immature Granulocytes: 1 %
Lymphocytes Relative: 5 %
Lymphs Abs: 0.6 10*3/uL — ABNORMAL LOW (ref 0.7–4.0)
MCH: 25.2 pg — ABNORMAL LOW (ref 26.0–34.0)
MCHC: 30.8 g/dL (ref 30.0–36.0)
MCV: 82 fL (ref 80.0–100.0)
Monocytes Absolute: 0.8 10*3/uL (ref 0.1–1.0)
Monocytes Relative: 6 %
Neutro Abs: 11.4 10*3/uL — ABNORMAL HIGH (ref 1.7–7.7)
Neutrophils Relative %: 86 %
Platelet Count: 387 10*3/uL (ref 150–400)
RBC: 3.33 MIL/uL — ABNORMAL LOW (ref 4.22–5.81)
RDW: 16 % — ABNORMAL HIGH (ref 11.5–15.5)
WBC Count: 13.1 10*3/uL — ABNORMAL HIGH (ref 4.0–10.5)
nRBC: 0 % (ref 0.0–0.2)

## 2019-03-16 LAB — SAMPLE TO BLOOD BANK

## 2019-03-16 MED ORDER — HEPARIN SOD (PORK) LOCK FLUSH 100 UNIT/ML IV SOLN
500.0000 [IU] | Freq: Once | INTRAVENOUS | Status: AC
  Administered 2019-03-16: 500 [IU]
  Filled 2019-03-16: qty 5

## 2019-03-16 MED ORDER — SODIUM CHLORIDE 0.9% FLUSH
10.0000 mL | Freq: Once | INTRAVENOUS | Status: AC | PRN
  Administered 2019-03-16: 10 mL
  Filled 2019-03-16: qty 10

## 2019-03-16 MED ORDER — SODIUM CHLORIDE 0.9% FLUSH
10.0000 mL | Freq: Once | INTRAVENOUS | Status: AC
  Administered 2019-03-16: 10 mL
  Filled 2019-03-16: qty 10

## 2019-03-16 MED ORDER — POTASSIUM CHLORIDE ER 10 MEQ PO CPCR: ORAL_CAPSULE | ORAL | 1 refills | Status: DC

## 2019-03-16 MED ORDER — HEPARIN SOD (PORK) LOCK FLUSH 100 UNIT/ML IV SOLN
500.0000 [IU] | Freq: Once | INTRAVENOUS | Status: AC | PRN
  Administered 2019-03-16: 500 [IU]
  Filled 2019-03-16: qty 5

## 2019-03-16 MED ORDER — SODIUM CHLORIDE 0.9 % IV SOLN
510.0000 mg | Freq: Once | INTRAVENOUS | Status: AC
  Administered 2019-03-16: 510 mg via INTRAVENOUS
  Filled 2019-03-16: qty 510

## 2019-03-16 MED ORDER — SODIUM CHLORIDE 0.9 % IV SOLN
Freq: Once | INTRAVENOUS | Status: AC
  Filled 2019-03-16: qty 250

## 2019-03-16 NOTE — Telephone Encounter (Signed)
Scheduled appts per 3/11 los. Gave pt a print out of AVS.

## 2019-03-16 NOTE — Progress Notes (Signed)
[place] OFFICE PROGRESS NOTE   Diagnosis: Colon cancer  INTERVAL HISTORY:   Cody Suarez returns as scheduled.  He feels well.  Good appetite.  No bleeding or pain.  He reports malaise following the second Covid vaccine.  No dyspnea at present.  He reports developing diarrhea when he takes iron.  He discontinued iron.  He has difficulty taking the large potassium chloride pills.  Objective:  Vital signs in last 24 hours:  Blood pressure (!) 144/64, pulse 99, temperature 99.1 F (37.3 C), temperature source Temporal, resp. rate 16, height 5' 10"  (1.778 m), weight 156 lb 1.6 oz (70.8 kg), SpO2 100 %.    Resp: Lungs clear bilaterally Cardio: Regular rate and rhythm GI: No mass, nontender, no hepatosplenomegaly Vascular: No leg edema   Portacath/PICC-without erythema  Lab Results:  Lab Results  Component Value Date   WBC 13.1 (H) 03/16/2019   HGB 8.4 (L) 03/16/2019   HCT 27.3 (L) 03/16/2019   MCV 82.0 03/16/2019   PLT 387 03/16/2019   NEUTROABS 11.4 (H) 03/16/2019    CMP  Lab Results  Component Value Date   NA 134 (L) 10/12/2018   K 3.8 10/12/2018   CL 104 10/12/2018   CO2 20 (L) 10/12/2018   GLUCOSE 123 (H) 10/12/2018   BUN 10 10/12/2018   CREATININE 0.87 10/12/2018   CALCIUM 8.9 10/12/2018   PROT 6.8 10/12/2018   ALBUMIN 3.3 (L) 10/12/2018   AST 29 10/12/2018   ALT 9 10/12/2018   ALKPHOS 192 (H) 10/12/2018   BILITOT 0.3 10/12/2018   GFRNONAA >60 10/12/2018   GFRAA >60 10/12/2018     Medications: I have reviewed the patient's current medications.   Assessment/Plan: 1. 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 Negative for a BRAF mutation  Tumor invaded through the muscularis propria into pericolonic fatty tissue and involved the attached omentum.    Cycle 1 adjuvant Xeloda 05/28/2013.   Cycle 2 adjuvant Xeloda 06/18/2013.   Cycle 3 adjuvant Xeloda 07/09/2013.   Cycle 4 adjuvant Xeloda 07/30/2013.   Cycle 5 adjuvant Xeloda 08/20/2013.  Cycle 6 adjuvant Xeloda 09/11/2013.  cycle 7 adjuvant Xeloda 10/02/2013  Cycle 8 adjuvant Xeloda 10/23/2013  CTs of the chest, abdomen, and pelvis on 07/18/2014-negative for recurrent colon cancer   CTs abdomen/pelvis 09/06/2014 showed acute cholecystitis.  Colonoscopy 08/02/2015-chronic colitis, no malignancy  CTs 09/17/2015, new mass near the tail the pancreas, stable lung nodules  Biopsy mesenteric mass 10/04/2015 with metastatic adenocarcinoma consistent with colorectal primary  PET scan 10/10/2015 with soft tissue thickening within the peritoneal space of the left upper quadrant with SUV Max 7.9; no additional sites of peritoneal nodularity to suggest metastasis; hypermetabolic ill-defined nodule in the left lower lobe favored inflammatory.  Resection of the left abdominal mesenteric mass/tail the pancreas on 11/05/2015 with the pathology confirming metastatic colon cancer, positive "serosal" margin  CTs 07/29/2016-no evidence of metastatic disease, stable small lung nodules, decreased size of pancreatic resection bed fluid/mass  CTs 06/28/2017-decreased fluid and increased soft tissue at the area of the distal pancreatectomy with a few foci of gas  CT 09/29/2017-enlargement of left upper quadrant soft tissue mass between the stomach, spleen, and pancreas tail, stable nodules at the lung bases  CT biopsy of the upper quadrant mass 10/19/2017-metastatic colon cancer, MSS, tumor mutation burden 1, no KRAS, BRAF, or NRAS mutation  CTs  12/24/2017-complex left upper quadrant process involving the stomach, spleen, tail the pancreas, and distal transverse colon.  CT 04/27/2018-stable to mild progression of the left upper quadrant mass, compression of the stomach with no obstruction,  no other evidence of metastatic disease, resolution of small fluid collection lateral to the left upper quadrant mass  CT 07/21/2018-enlargement of the left upper quadrant mass with involvement of the posterior stomach and spleen. Possible new right upper lung nodule  Cycle 1 FOLFOX 08/17/2018  Cycle 2 FOLFOX 08/31/2018, Udenyca added  Cycle 3 FOLFOX 09/14/2018, Udenyca  Cycle 4 FOLFOX 09/29/2018, oxaliplatin and 5-fluorouracil dose reduced 5-FU bolus held  CT abdomen/pelvis 10/12/2018-decrease in size of mass at the pancreas tail/posterior gastric wall, and splenic flexure of the colon, mass is more cystic 2. Ulcerative colitis-extensive chronic active ulcerative colitis was noted on the colon resection specimen 03/28/2013.  Followed by Dr.Magod 3. Hypertension.  4. History of microcytic anemia-likely iron deficiency, unable to tolerate oral iron.  5. Possible area of cecal wall thickening noted on abdominal CT 03/20/2013. 6. Family history of colon cancer (maternal grandfather died in his 20s with colon cancer). 7. Bilateral calf and low anterior leg pain. Bilateral lower extremity venous duplex negative for DVT 07/14/2013. Right ABI with moderate, borderline severe arterial insufficiency; left ABI suggestive of moderate arterial insufficiency. He was referred to vascular. 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 TEE showed thrombus in the descending aorta. He underwent right popliteal to peroneal artery bypass graft, thrombectomy anterior tibialis and attempted thrombectomy tibioperoneal trunk and posterior tibialis on 08/11/2013. Right calf pain 09/26/2013 with findings of occlusion of right popliteal to peroneal bypass status post thrombolysis. Now on anticoagulation with Xarelto.  recurrent pain and ulceration at the right foot, status post a popliteal to posterior tibial artery bypass using a cephalic vein graft on 70/26/3785  Xarelto discontinued due to high risk  for bleeding and aspirin 81 mg daily initiated hospitalization July 2020 8. Status post laparoscopic cholecystectomy 09/08/2014 9. Anorexia following the mesenteric mass/pancreas tail resection-improved 10. Abdominal abscess November 2017-pancreas "cysts "drainage catheter placed 11/26/2015; CT 02/04/2016 with no change to slight decreasein size of residual pancreatic tail fluid collection.Drainage catheter removed approximately early March 2018  CT 02/20/2016-new small left pleural effusion, increased size of the surgical bed fluid collection, indeterminate pulmonary nodules  CT 07/29/2016-decreased fluid/soft tissue density near the pancreas is no evidence of metastatic disease, stable subcentimeter lung nodules 11.Admission 12/24/2017 with an abdominal abscess, status post placement of a percutaneous drain and antibiotic therapy; CTs abdomen/pelvis 01/04/2018-near complete drainage of previously noted complex fluid collection in the left upper quadrant. 2 small residual collections remain. Residual soft tissue mass at the clips concerning for residual recurrent neoplasm. Peritoneal wall thickening also concerning for metastatic disease. Drain removed 01/04/2018  CT 01/18/2018-no change in soft tissue mass at the tail of pancreas with involvement of the gastric fundus and spleen, there is central low attenuation  Admission 01/20/2018 with fever and failure to thrive, no drainable fluid collection, placed on IV antibiotics, discharged to complete an outpatient course of IV ceftriaxone and Flagyl completed 02/20/2018  12.Admission 7/26/2020transient speech abnormality and microcytic anemia  13.Port-A-Cath placement, Dr. Hassell Done, 08/15/2018    Disposition: Cody Suarez appears unchanged.  The hemoglobin is lower today.  I suspect he has chronic GI bleeding related to tumor involving the GI tract.  He is unable to tolerate oral iron.  The MCV is lower and he has a history of iron  deficiency.  He will  be treated with IV iron today and again next week.  We reviewed potential toxicities associated with IV iron including the chance of an allergic reaction.  He agrees to proceed. Cody Suarez will return for an office visit and CBC in 3 weeks.  He will change to Micro-K for the potassium replacement.  He indicated again today that he does not wish to receive further chemotherapy.  Betsy Coder, MD  03/16/2019  1:01 PM

## 2019-03-16 NOTE — Patient Instructions (Signed)
Ferumoxytol injection What is this medicine? FERUMOXYTOL is an iron complex. Iron is used to make healthy red blood cells, which carry oxygen and nutrients throughout the body. This medicine is used to treat iron deficiency anemia. This medicine may be used for other purposes; ask your health care provider or pharmacist if you have questions. COMMON BRAND NAME(S): Feraheme What should I tell my health care provider before I take this medicine? They need to know if you have any of these conditions:  anemia not caused by low iron levels  high levels of iron in the blood  magnetic resonance imaging (MRI) test scheduled  an unusual or allergic reaction to iron, other medicines, foods, dyes, or preservatives  pregnant or trying to get pregnant  breast-feeding How should I use this medicine? This medicine is for injection into a vein. It is given by a health care professional in a hospital or clinic setting. Talk to your pediatrician regarding the use of this medicine in children. Special care may be needed. Overdosage: If you think you have taken too much of this medicine contact a poison control center or emergency room at once. NOTE: This medicine is only for you. Do not share this medicine with others. What if I miss a dose? It is important not to miss your dose. Call your doctor or health care professional if you are unable to keep an appointment. What may interact with this medicine? This medicine may interact with the following medications:  other iron products This list may not describe all possible interactions. Give your health care provider a list of all the medicines, herbs, non-prescription drugs, or dietary supplements you use. Also tell them if you smoke, drink alcohol, or use illegal drugs. Some items may interact with your medicine. What should I watch for while using this medicine? Visit your doctor or healthcare professional regularly. Tell your doctor or healthcare  professional if your symptoms do not start to get better or if they get worse. You may need blood work done while you are taking this medicine. You may need to follow a special diet. Talk to your doctor. Foods that contain iron include: whole grains/cereals, dried fruits, beans, or peas, leafy green vegetables, and organ meats (liver, kidney). What side effects may I notice from receiving this medicine? Side effects that you should report to your doctor or health care professional as soon as possible:  allergic reactions like skin rash, itching or hives, swelling of the face, lips, or tongue  breathing problems  changes in blood pressure  feeling faint or lightheaded, falls  fever or chills  flushing, sweating, or hot feelings  swelling of the ankles or feet Side effects that usually do not require medical attention (report to your doctor or health care professional if they continue or are bothersome):  diarrhea  headache  nausea, vomiting  stomach pain This list may not describe all possible side effects. Call your doctor for medical advice about side effects. You may report side effects to FDA at 1-800-FDA-1088. Where should I keep my medicine? This drug is given in a hospital or clinic and will not be stored at home. NOTE: This sheet is a summary. It may not cover all possible information. If you have questions about this medicine, talk to your doctor, pharmacist, or health care provider.  2020 Elsevier/Gold Standard (2016-02-10 20:21:10) Coronavirus (COVID-19) Are you at risk?  Are you at risk for the Coronavirus (COVID-19)?  To be considered HIGH RISK for Coronavirus (COVID-19),   you have to meet the following criteria:  . Traveled to [place], [place], [place], [place] or [place]; or in the [place] to [place], [place], [place], or [place]; and have fever, cough, and shortness of breath within the last 2 weeks of travel OR . Been in close contact with a person  diagnosed with COVID-19 within the last 2 weeks and have fever, cough, and shortness of breath . IF YOU DO NOT MEET THESE CRITERIA, YOU ARE CONSIDERED LOW RISK FOR COVID-19.  What to do if you are HIGH RISK for COVID-19?  . If you are having a medical emergency, call 911. . Seek medical care right away. Before you go to a doctor's office, urgent care or emergency department, call ahead and tell them about your recent travel, contact with someone diagnosed with COVID-19, and your symptoms. You should receive instructions from your physician's office regarding next steps of care.  . When you arrive at healthcare provider, tell the healthcare staff immediately you have returned from visiting [place], [place], [place], [place] or [place]; or traveled in the [place] to [place], [place], [place], or [place]; in the last two weeks or you have been in close contact with a person diagnosed with COVID-19 in the last 2 weeks.   . Tell the health care staff about your symptoms: fever, cough and shortness of breath. . After you have been seen by a medical provider, you will be either: o Tested for (COVID-19) and discharged home on quarantine except to seek medical care if symptoms worsen, and asked to  - Stay home and avoid contact with others until you get your results (4-5 days)  - Avoid travel on public transportation if possible (such as bus, train, or airplane) or o Sent to the Emergency Department by EMS for evaluation, COVID-19 testing, and possible admission depending on your condition and test results.  What to do if you are LOW RISK for COVID-19?  Reduce your risk of any infection by using the same precautions used for avoiding the common cold or flu:  . Wash your hands often with soap and warm water for at least 20 seconds.  If soap and water are not readily available, use an alcohol-based hand sanitizer with at least 60% alcohol.  . If coughing or sneezing, cover your mouth and nose by  coughing or sneezing into the elbow areas of your shirt or coat, into a tissue or into your sleeve (not your hands). . Avoid shaking hands with others and consider head nods or verbal greetings only. . Avoid touching your eyes, nose, or mouth with unwashed hands.  . Avoid close contact with people who are sick. . Avoid places or events with large numbers of people in one location, like concerts or sporting events. . Carefully consider travel plans you have or are making. . If you are planning any travel outside or inside the [place], visit the CDC's Travelers' Health webpage for the latest health notices. . If you have some symptoms but not all symptoms, continue to monitor at home and seek medical attention if your symptoms worsen. . If you are having a medical emergency, call 911.   ADDITIONAL HEALTHCARE OPTIONS FOR PATIENTS  [place] [facility] / e-Visit: [email or web address]         [facility]: [phone]  [facility]: [phone]                   [facility]: [phone]   

## 2019-03-24 ENCOUNTER — Other Ambulatory Visit: Payer: Self-pay

## 2019-03-24 ENCOUNTER — Inpatient Hospital Stay: Payer: Medicare Other

## 2019-03-24 VITALS — BP 132/50 | HR 100 | Temp 97.4°F | Resp 18

## 2019-03-24 DIAGNOSIS — C7889 Secondary malignant neoplasm of other digestive organs: Secondary | ICD-10-CM | POA: Diagnosis not present

## 2019-03-24 DIAGNOSIS — R110 Nausea: Secondary | ICD-10-CM | POA: Diagnosis not present

## 2019-03-24 DIAGNOSIS — D509 Iron deficiency anemia, unspecified: Secondary | ICD-10-CM | POA: Diagnosis not present

## 2019-03-24 DIAGNOSIS — C184 Malignant neoplasm of transverse colon: Secondary | ICD-10-CM | POA: Diagnosis not present

## 2019-03-24 DIAGNOSIS — R079 Chest pain, unspecified: Secondary | ICD-10-CM | POA: Diagnosis not present

## 2019-03-24 DIAGNOSIS — Z95828 Presence of other vascular implants and grafts: Secondary | ICD-10-CM

## 2019-03-24 DIAGNOSIS — C799 Secondary malignant neoplasm of unspecified site: Secondary | ICD-10-CM

## 2019-03-24 DIAGNOSIS — R1012 Left upper quadrant pain: Secondary | ICD-10-CM | POA: Diagnosis not present

## 2019-03-24 MED ORDER — SODIUM CHLORIDE 0.9% FLUSH
10.0000 mL | Freq: Once | INTRAVENOUS | Status: AC
  Administered 2019-03-24: 10 mL
  Filled 2019-03-24: qty 10

## 2019-03-24 MED ORDER — HEPARIN SOD (PORK) LOCK FLUSH 100 UNIT/ML IV SOLN
500.0000 [IU] | Freq: Once | INTRAVENOUS | Status: AC
  Administered 2019-03-24: 16:00:00 500 [IU]
  Filled 2019-03-24: qty 5

## 2019-03-24 MED ORDER — SODIUM CHLORIDE 0.9 % IV SOLN
510.0000 mg | Freq: Once | INTRAVENOUS | Status: AC
  Administered 2019-03-24: 510 mg via INTRAVENOUS
  Filled 2019-03-24: qty 510

## 2019-03-24 MED ORDER — SODIUM CHLORIDE 0.9 % IV SOLN
Freq: Once | INTRAVENOUS | Status: AC
  Filled 2019-03-24: qty 250

## 2019-03-24 NOTE — Patient Instructions (Signed)
Ferumoxytol injection What is this medicine? FERUMOXYTOL is an iron complex. Iron is used to make healthy red blood cells, which carry oxygen and nutrients throughout the body. This medicine is used to treat iron deficiency anemia. This medicine may be used for other purposes; ask your health care provider or pharmacist if you have questions. COMMON BRAND NAME(S): Feraheme What should I tell my health care provider before I take this medicine? They need to know if you have any of these conditions:  anemia not caused by low iron levels  high levels of iron in the blood  magnetic resonance imaging (MRI) test scheduled  an unusual or allergic reaction to iron, other medicines, foods, dyes, or preservatives  pregnant or trying to get pregnant  breast-feeding How should I use this medicine? This medicine is for injection into a vein. It is given by a health care professional in a hospital or clinic setting. Talk to your pediatrician regarding the use of this medicine in children. Special care may be needed. Overdosage: If you think you have taken too much of this medicine contact a poison control center or emergency room at once. NOTE: This medicine is only for you. Do not share this medicine with others. What if I miss a dose? It is important not to miss your dose. Call your doctor or health care professional if you are unable to keep an appointment. What may interact with this medicine? This medicine may interact with the following medications:  other iron products This list may not describe all possible interactions. Give your health care provider a list of all the medicines, herbs, non-prescription drugs, or dietary supplements you use. Also tell them if you smoke, drink alcohol, or use illegal drugs. Some items may interact with your medicine. What should I watch for while using this medicine? Visit your doctor or healthcare professional regularly. Tell your doctor or healthcare  professional if your symptoms do not start to get better or if they get worse. You may need blood work done while you are taking this medicine. You may need to follow a special diet. Talk to your doctor. Foods that contain iron include: whole grains/cereals, dried fruits, beans, or peas, leafy green vegetables, and organ meats (liver, kidney). What side effects may I notice from receiving this medicine? Side effects that you should report to your doctor or health care professional as soon as possible:  allergic reactions like skin rash, itching or hives, swelling of the face, lips, or tongue  breathing problems  changes in blood pressure  feeling faint or lightheaded, falls  fever or chills  flushing, sweating, or hot feelings  swelling of the ankles or feet Side effects that usually do not require medical attention (report to your doctor or health care professional if they continue or are bothersome):  diarrhea  headache  nausea, vomiting  stomach pain This list may not describe all possible side effects. Call your doctor for medical advice about side effects. You may report side effects to FDA at 1-800-FDA-1088. Where should I keep my medicine? This drug is given in a hospital or clinic and will not be stored at home. NOTE: This sheet is a summary. It may not cover all possible information. If you have questions about this medicine, talk to your doctor, pharmacist, or health care provider.  2020 Elsevier/Gold Standard (2016-02-10 20:21:10) Coronavirus (COVID-19) Are you at risk?  Are you at risk for the Coronavirus (COVID-19)?  To be considered HIGH RISK for Coronavirus (COVID-19),   you have to meet the following criteria:  . Traveled to [place], [place], [place], [place] or [place]; or in the [place] to [place], [place], [place], or [place]; and have fever, cough, and shortness of breath within the last 2 weeks of travel OR . Been in close contact with a person  diagnosed with COVID-19 within the last 2 weeks and have fever, cough, and shortness of breath . IF YOU DO NOT MEET THESE CRITERIA, YOU ARE CONSIDERED LOW RISK FOR COVID-19.  What to do if you are HIGH RISK for COVID-19?  . If you are having a medical emergency, call 911. . Seek medical care right away. Before you go to a doctor's office, urgent care or emergency department, call ahead and tell them about your recent travel, contact with someone diagnosed with COVID-19, and your symptoms. You should receive instructions from your physician's office regarding next steps of care.  . When you arrive at healthcare provider, tell the healthcare staff immediately you have returned from visiting [place], [place], [place], [place] or [place]; or traveled in the [place] to [place], [place], [place], or [place]; in the last two weeks or you have been in close contact with a person diagnosed with COVID-19 in the last 2 weeks.   . Tell the health care staff about your symptoms: fever, cough and shortness of breath. . After you have been seen by a medical provider, you will be either: o Tested for (COVID-19) and discharged home on quarantine except to seek medical care if symptoms worsen, and asked to  - Stay home and avoid contact with others until you get your results (4-5 days)  - Avoid travel on public transportation if possible (such as bus, train, or airplane) or o Sent to the Emergency Department by EMS for evaluation, COVID-19 testing, and possible admission depending on your condition and test results.  What to do if you are LOW RISK for COVID-19?  Reduce your risk of any infection by using the same precautions used for avoiding the common cold or flu:  . Wash your hands often with soap and warm water for at least 20 seconds.  If soap and water are not readily available, use an alcohol-based hand sanitizer with at least 60% alcohol.  . If coughing or sneezing, cover your mouth and nose by  coughing or sneezing into the elbow areas of your shirt or coat, into a tissue or into your sleeve (not your hands). . Avoid shaking hands with others and consider head nods or verbal greetings only. . Avoid touching your eyes, nose, or mouth with unwashed hands.  . Avoid close contact with people who are sick. . Avoid places or events with large numbers of people in one location, like concerts or sporting events. . Carefully consider travel plans you have or are making. . If you are planning any travel outside or inside the [place], visit the CDC's Travelers' Health webpage for the latest health notices. . If you have some symptoms but not all symptoms, continue to monitor at home and seek medical attention if your symptoms worsen. . If you are having a medical emergency, call 911.   ADDITIONAL HEALTHCARE OPTIONS FOR PATIENTS  [place] [facility] / e-Visit: [email or web address]         [facility]: [phone]  [facility]: [phone]                   [facility]: [phone]   

## 2019-03-29 ENCOUNTER — Telehealth: Payer: Self-pay | Admitting: *Deleted

## 2019-03-29 NOTE — Telephone Encounter (Signed)
Cody Suarez states he has been having pain in his left side over rib, where drain had been.  Has used tylenol for brief relief, having a hard time sleeping. Denies SOB.  Will come to see Dr Benay Spice Thursday or Friday- message to scheduler

## 2019-03-30 ENCOUNTER — Ambulatory Visit (HOSPITAL_COMMUNITY)
Admission: RE | Admit: 2019-03-30 | Discharge: 2019-03-30 | Disposition: A | Discharge status: Home / Self Care | Payer: Medicare Other | Source: Ambulatory Visit | Attending: Oncology | Admitting: Oncology

## 2019-03-30 ENCOUNTER — Other Ambulatory Visit: Payer: Self-pay

## 2019-03-30 ENCOUNTER — Inpatient Hospital Stay: Payer: Medicare Other

## 2019-03-30 ENCOUNTER — Inpatient Hospital Stay (HOSPITAL_BASED_OUTPATIENT_CLINIC_OR_DEPARTMENT_OTHER): Payer: Medicare Other | Admitting: Oncology

## 2019-03-30 ENCOUNTER — Encounter (HOSPITAL_COMMUNITY): Payer: Self-pay

## 2019-03-30 VITALS — BP 124/50 | HR 101 | Temp 98.9°F | Resp 17 | Ht 70.0 in | Wt 154.1 lb

## 2019-03-30 DIAGNOSIS — C799 Secondary malignant neoplasm of unspecified site: Secondary | ICD-10-CM

## 2019-03-30 DIAGNOSIS — C189 Malignant neoplasm of colon, unspecified: Secondary | ICD-10-CM | POA: Insufficient documentation

## 2019-03-30 DIAGNOSIS — R1012 Left upper quadrant pain: Secondary | ICD-10-CM | POA: Diagnosis not present

## 2019-03-30 DIAGNOSIS — Z95828 Presence of other vascular implants and grafts: Secondary | ICD-10-CM

## 2019-03-30 DIAGNOSIS — D509 Iron deficiency anemia, unspecified: Secondary | ICD-10-CM | POA: Diagnosis not present

## 2019-03-30 DIAGNOSIS — C7889 Secondary malignant neoplasm of other digestive organs: Secondary | ICD-10-CM | POA: Diagnosis not present

## 2019-03-30 DIAGNOSIS — C184 Malignant neoplasm of transverse colon: Secondary | ICD-10-CM | POA: Diagnosis not present

## 2019-03-30 DIAGNOSIS — R079 Chest pain, unspecified: Secondary | ICD-10-CM | POA: Diagnosis not present

## 2019-03-30 DIAGNOSIS — R110 Nausea: Secondary | ICD-10-CM | POA: Diagnosis not present

## 2019-03-30 LAB — CBC WITH DIFFERENTIAL (CANCER CENTER ONLY)
Abs Immature Granulocytes: 0.15 10*3/uL — ABNORMAL HIGH (ref 0.00–0.07)
Basophils Absolute: 0.2 10*3/uL — ABNORMAL HIGH (ref 0.0–0.1)
Basophils Relative: 1 %
Eosinophils Absolute: 0.1 10*3/uL (ref 0.0–0.5)
Eosinophils Relative: 0 %
HCT: 31.1 % — ABNORMAL LOW (ref 39.0–52.0)
Hemoglobin: 9.8 g/dL — ABNORMAL LOW (ref 13.0–17.0)
Immature Granulocytes: 1 %
Lymphocytes Relative: 5 %
Lymphs Abs: 1.2 10*3/uL (ref 0.7–4.0)
MCH: 26.3 pg (ref 26.0–34.0)
MCHC: 31.5 g/dL (ref 30.0–36.0)
MCV: 83.4 fL (ref 80.0–100.0)
Monocytes Absolute: 2.1 10*3/uL — ABNORMAL HIGH (ref 0.1–1.0)
Monocytes Relative: 9 %
Neutro Abs: 19.2 10*3/uL — ABNORMAL HIGH (ref 1.7–7.7)
Neutrophils Relative %: 84 %
Platelet Count: 421 10*3/uL — ABNORMAL HIGH (ref 150–400)
RBC: 3.73 MIL/uL — ABNORMAL LOW (ref 4.22–5.81)
RDW: 20 % — ABNORMAL HIGH (ref 11.5–15.5)
WBC Count: 22.9 10*3/uL — ABNORMAL HIGH (ref 4.0–10.5)
nRBC: 0 % (ref 0.0–0.2)

## 2019-03-30 LAB — CMP (CANCER CENTER ONLY)
ALT: 10 U/L (ref 0–44)
AST: 15 U/L (ref 15–41)
Albumin: 2.8 g/dL — ABNORMAL LOW (ref 3.5–5.0)
Alkaline Phosphatase: 89 U/L (ref 38–126)
Anion gap: 11 (ref 5–15)
BUN: 10 mg/dL (ref 8–23)
CO2: 20 mmol/L — ABNORMAL LOW (ref 22–32)
Calcium: 8.5 mg/dL — ABNORMAL LOW (ref 8.9–10.3)
Chloride: 100 mmol/L (ref 98–111)
Creatinine: 0.97 mg/dL (ref 0.61–1.24)
GFR, Est AFR Am: >60 mL/min (ref >60)
GFR, Estimated: >60 mL/min (ref >60)
Glucose, Bld: 136 mg/dL — ABNORMAL HIGH (ref 70–99)
Potassium: 3.7 mmol/L (ref 3.5–5.1)
Sodium: 131 mmol/L — ABNORMAL LOW (ref 135–145)
Total Bilirubin: 0.4 mg/dL (ref 0.3–1.2)
Total Protein: 7.1 g/dL (ref 6.5–8.1)

## 2019-03-30 LAB — SAMPLE TO BLOOD BANK

## 2019-03-30 MED ORDER — HEPARIN SOD (PORK) LOCK FLUSH 100 UNIT/ML IV SOLN
INTRAVENOUS | Status: AC
  Filled 2019-03-30: qty 5

## 2019-03-30 MED ORDER — HYDROMORPHONE HCL 2 MG PO TABS: 2.0000 mg | ORAL_TABLET | ORAL | 0 refills | Status: DC | PRN

## 2019-03-30 MED ORDER — OXYCODONE-ACETAMINOPHEN 5-325 MG PO TABS
ORAL_TABLET | ORAL | Status: AC
  Filled 2019-03-30: qty 1

## 2019-03-30 MED ORDER — LIDOCAINE-PRILOCAINE 2.5-2.5 % EX CREA: TOPICAL_CREAM | Freq: Once | CUTANEOUS | Status: AC

## 2019-03-30 MED ORDER — SODIUM CHLORIDE 0.9% FLUSH
10.0000 mL | Freq: Once | INTRAVENOUS | Status: AC
  Administered 2019-03-30: 10 mL
  Filled 2019-03-30: qty 10

## 2019-03-30 MED ORDER — OXYCODONE-ACETAMINOPHEN 5-325 MG PO TABS
1.0000 | ORAL_TABLET | Freq: Once | ORAL | Status: AC
  Administered 2019-03-30: 1 via ORAL

## 2019-03-30 MED ORDER — HEPARIN SOD (PORK) LOCK FLUSH 100 UNIT/ML IV SOLN
500.0000 [IU] | Freq: Once | INTRAVENOUS | Status: AC
  Administered 2019-03-30: 500 [IU]
  Filled 2019-03-30: qty 5

## 2019-03-30 MED ORDER — IOHEXOL 300 MG/ML  SOLN
100.0000 mL | Freq: Once | INTRAMUSCULAR | Status: AC | PRN
  Administered 2019-03-30: 100 mL via INTRAVENOUS

## 2019-03-30 MED ORDER — SODIUM CHLORIDE (PF) 0.9 % IJ SOLN
INTRAMUSCULAR | Status: AC
  Filled 2019-03-30: qty 50

## 2019-03-30 MED ORDER — LIDOCAINE-PRILOCAINE 2.5-2.5 % EX CREA
TOPICAL_CREAM | CUTANEOUS | Status: AC
  Filled 2019-03-30: qty 5

## 2019-03-30 MED ORDER — LIDOCAINE-PRILOCAINE 2.5-2.5 % EX CREA: TOPICAL_CREAM | Freq: Once | CUTANEOUS | Status: DC

## 2019-03-30 NOTE — Progress Notes (Signed)
[facility] OFFICE PROGRESS NOTE   Diagnosis: Colon cancer  INTERVAL HISTORY:   Cody Suarez is prior to a scheduled visit.  He complains of left upper abdomen/lower chest pain for the past 3 to 4 days.  He takes half of a hydrocodone tablet for relief of pain.  He develops pruritus when he takes a full hydrocodone tablet.  He has mild nausea.  No bleeding.  His appetite is poor.  Mild dyspnea.  No cough or fever.  Objective:  Vital signs in last 24 hours:  Blood pressure (!) 124/50, pulse (!) 101, temperature 98.9 F (37.2 C), temperature source Temporal, resp. rate 17, height 5' 10"  (1.778 m), weight 154 lb 1.6 oz (69.9 kg), SpO2 97 %.     Resp: Lungs clear bilaterally, no respiratory distress Cardio: Regular rate and rhythm GI: No hepatosplenomegaly, nontender, no mass Vascular: No leg edema Neuro: Alert and oriented   Portacath/PICC-without erythema  Lab Results:  Lab Results  Component Value Date   WBC 22.9 (H) 03/30/2019   HGB 9.8 (L) 03/30/2019   HCT 31.1 (L) 03/30/2019   MCV 83.4 03/30/2019   PLT 421 (H) 03/30/2019   NEUTROABS 19.2 (H) 03/30/2019    CMP  Lab Results  Component Value Date   NA 131 (L) 03/30/2019   K 3.7 03/30/2019   CL 100 03/30/2019   CO2 20 (L) 03/30/2019   GLUCOSE 136 (H) 03/30/2019   BUN 10 03/30/2019   CREATININE 0.97 03/30/2019   CALCIUM 8.5 (L) 03/30/2019   PROT 7.1 03/30/2019   ALBUMIN 2.8 (L) 03/30/2019   AST 15 03/30/2019   ALT 10 03/30/2019   ALKPHOS 89 03/30/2019   BILITOT 0.4 03/30/2019   GFRNONAA >60 03/30/2019   GFRAA >60 03/30/2019    Lab Results  Component Value Date   CEA1 2.72 09/29/2018    Imaging:  CT ABDOMEN PELVIS W CONTRAST  Result Date: 03/30/2019 CLINICAL DATA:  Restaging colon cancer. Left upper quadrant abdominal pain. EXAM: CT ABDOMEN AND PELVIS WITH CONTRAST TECHNIQUE: Multidetector CT imaging of the abdomen and pelvis was performed using the standard protocol following bolus  administration of intravenous contrast. CONTRAST:  153m OMNIPAQUE IOHEXOL 300 MG/ML  SOLN COMPARISON:  Multiple prior imaging studies. The most recent CT scan is 10/12/2018 FINDINGS: Lower chest: Very small left pleural effusion. Minimal overlying atelectasis. No infiltrates or worrisome pulmonary lesions. The heart is normal in size. No pericardial effusion. The distal esophagus is grossly normal. Hepatobiliary: No hepatic lesions to suggest metastatic disease. The gallbladder is surgically absent. No common bile duct dilatation. Pancreas: There is a large irregular partially necrotic left upper quadrant abdominal mass which is directly invading/involving the stomach, pancreatic tail, spleen and splenic flexure of the colon. The lesion measures approximately 8.4 x 7.2 cm. It was largely cystic on the most recent CT scan and measured a maximum of 4.7 x 4.0 cm. Findings highly suspicious for direct invasion of the abdominal wall also. I do not see any destructive rib changes. The pancreatic head and body are unremarkable. Spleen: Normal size. Directly invaded by tumor in the splenic hilum region. Adrenals/Urinary Tract: The adrenal glands and kidneys are unremarkable and appears stable. No renal lesions are hydroureteronephrosis. The bladder is unremarkable. Stomach/Bowel: The stomach, duodenum, small bowel and colon appear relatively stable. Tumor does involve/invade the splenic flexure of the colon but no obstruction. The remainder of the colon is unremarkable. The terminal ileum is normal. Vascular/Lymphatic: Stable advanced atherosclerotic calcifications involving the aorta  and branch vessel ostia and iliac arteries but no aneurysm or dissection. No mesenteric or retroperitoneal lymphadenopathy. Reproductive: The prostate gland and seminal vesicles are unremarkable. Other: No pelvic mass or adenopathy. No free pelvic fluid collections. No inguinal mass or adenopathy. Stable small periumbilical abdominal wall  hernia containing fat and a small bowel loop. Musculoskeletal: No significant bony findings. IMPRESSION: 1. Interval significant enlargement of the left upper quadrant abdominal mass which is directly invading the stomach, pancreatic tail, spleen and splenic flexure of the colon. No findings for hepatic metastatic disease. No mesenteric or retroperitoneal adenopathy. 2. Small left pleural effusion. 3. Stable advanced atherosclerotic calcifications involving the aorta and branch vessels. 4. Stable periumbilical abdominal wall hernia containing fat and a small bowel loop. Aortic Atherosclerosis (ICD10-I70.0). Electronically Signed   By: Marijo Sanes M.D.   On: 03/30/2019 14:56    Medications: I have reviewed the patient's current medications.   Assessment/Plan: 1. 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 Negative for a BRAF mutation  Tumor invaded through the muscularis propria into pericolonic fatty tissue and involved the attached omentum.   Cycle 1 adjuvant Xeloda 05/28/2013.   Cycle 2 adjuvant Xeloda 06/18/2013.   Cycle 3 adjuvant Xeloda 07/09/2013.   Cycle 4 adjuvant Xeloda 07/30/2013.   Cycle 5 adjuvant Xeloda 08/20/2013.  Cycle 6 adjuvant Xeloda 09/11/2013.  cycle 7 adjuvant Xeloda 10/02/2013  Cycle 8 adjuvant Xeloda 10/23/2013  CTs of the chest, abdomen, and pelvis on 07/18/2014-negative for recurrent colon cancer   CTs abdomen/pelvis 09/06/2014 showed acute cholecystitis.  Colonoscopy 08/02/2015-chronic colitis, no malignancy  CTs 09/17/2015, new mass near the tail the pancreas, stable lung nodules  Biopsy mesenteric mass 10/04/2015 with metastatic adenocarcinoma consistent with colorectal primary  PET scan 10/10/2015 with soft tissue thickening within the peritoneal space of the left  upper quadrant with SUV Max 7.9; no additional sites of peritoneal nodularity to suggest metastasis; hypermetabolic ill-defined nodule in the left lower lobe favored inflammatory.  Resection of the left abdominal mesenteric mass/tail the pancreas on 11/05/2015 with the pathology confirming metastatic colon cancer, positive "serosal" margin  CTs 07/29/2016-no evidence of metastatic disease, stable small lung nodules, decreased size of pancreatic resection bed fluid/mass  CTs 06/28/2017-decreased fluid and increased soft tissue at the area of the distal pancreatectomy with a few foci of gas  CT 09/29/2017-enlargement of left upper quadrant soft tissue mass between the stomach, spleen, and pancreas tail, stable nodules at the lung bases  CT biopsy of the upper quadrant mass 10/19/2017-metastatic colon cancer, MSS, tumor mutation burden 1, no KRAS, BRAF, or NRAS mutation  CTs 12/24/2017-complex left upper quadrant process involving the stomach, spleen, tail the pancreas, and distal transverse colon.  CT 04/27/2018-stable to mild progression of the left upper quadrant mass, compression of the stomach with no obstruction, no other evidence of metastatic disease, resolution of small fluid collection lateral to the left upper quadrant mass  CT 07/21/2018-enlargement of the left upper quadrant mass with involvement of the posterior stomach and spleen. Possible new right upper lung nodule  Cycle 1 FOLFOX 08/17/2018  Cycle 2 FOLFOX 08/31/2018, Udenyca added  Cycle 3 FOLFOX 09/14/2018, Udenyca  Cycle 4 FOLFOX 09/29/2018, oxaliplatin and 5-fluorouracil dose reduced 5-FU bolus held  CT abdomen/pelvis 10/12/2018-decrease in size of mass at the pancreas tail/posterior gastric wall, and splenic flexure of the colon, mass is more cystic  CT abdomen/pelvis 03/30/2019-significant  enlargement of the left upper quadrant mass with invasion of the stomach, pancreas, spleen, and colon, small left pleural  effusion 2. Ulcerative colitis-extensive chronic active ulcerative colitis was noted on the colon resection specimen 03/28/2013.  Followed by Dr.Magod 3. Hypertension.  4. History of microcytic anemia-likely iron deficiency, unable to tolerate oral iron.  5. Possible area of cecal wall thickening noted on abdominal CT 03/20/2013. 6. Family history of colon cancer (maternal grandfather died in his 30s with colon cancer). 7. Bilateral calf and low anterior leg pain. Bilateral lower extremity venous duplex negative for DVT 07/14/2013. Right ABI with moderate, borderline severe arterial insufficiency; left ABI suggestive of moderate arterial insufficiency. He was referred to vascular. 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 TEE showed thrombus in the descending aorta. He underwent right popliteal to peroneal artery bypass graft, thrombectomy anterior tibialis and attempted thrombectomy tibioperoneal trunk and posterior tibialis on 08/11/2013. Right calf pain 09/26/2013 with findings of occlusion of right popliteal to peroneal bypass status post thrombolysis. Now on anticoagulation with Xarelto.  recurrent pain and ulceration at the right foot, status post a popliteal to posterior tibial artery bypass using a cephalic vein graft on 45/80/9983  Xarelto discontinued due to high risk for bleeding and aspirin 81 mg daily initiated hospitalization July 2020 8. Status post laparoscopic cholecystectomy 09/08/2014 9. Anorexia following the mesenteric mass/pancreas tail resection-improved 10. Abdominal abscess November 2017-pancreas "cysts "drainage catheter placed 11/26/2015; CT 02/04/2016 with no change to slight decreasein size of residual pancreatic tail fluid collection.Drainage catheter removed approximately early March 2018  CT 02/20/2016-new small left pleural effusion, increased size of the surgical bed fluid collection, indeterminate pulmonary nodules  CT  07/29/2016-decreased fluid/soft tissue density near the pancreas is no evidence of metastatic disease, stable subcentimeter lung nodules 11.Admission 12/24/2017 with an abdominal abscess, status post placement of a percutaneous drain and antibiotic therapy; CTs abdomen/pelvis 01/04/2018-near complete drainage of previously noted complex fluid collection in the left upper quadrant. 2 small residual collections remain. Residual soft tissue mass at the clips concerning for residual recurrent neoplasm. Peritoneal wall thickening also concerning for metastatic disease. Drain removed 01/04/2018  CT 01/18/2018-no change in soft tissue mass at the tail of pancreas with involvement of the gastric fundus and spleen, there is central low attenuation  Admission 01/20/2018 with fever and failure to thrive, no drainable fluid collection, placed on IV antibiotics, discharged to complete an outpatient course of IV ceftriaxone and Flagyl completed 02/20/2018  12.Admission 7/26/2020transient speech abnormality and microcytic anemia  13.Port-A-Cath placement, Dr. Hassell Done, 08/15/2018  Disposition: Cody Suarez has metastatic colon cancer.  He completed 4 cycles of FOLFOX chemotherapy, last in September 2020 with a clinical response.  He decided to discontinue chemotherapy after cycle 4. He presents today with increased pain in the left upper abdomen.  The pain appears to be related to enlargement of the left upper quadrant mass with invasion of local structures including the abdominal wall.  Mr. Beckner was seen early this morning and again in the late afternoon.  I reviewed the CT images with Mr. Everingham, his wife, and daughter.  We discussed treatment options.  We discussed comfort care/hospice versus a trial of salvage therapy.  I recommend repeat treatment with FOLFOX since he responded to this last year.  Mr. Falwell is undecided on whether to pursue hospice care versus a trial of salvage chemotherapy.  He  would like to return for an office visit and further discussion on 04/10/2019.  We prescribed hydromorphone to use as  needed for pain.  He will contact us for increased pain or new symptoms prior to the office visit on 04/10/2019.  Approximately 50 minutes were spent with the patient today.  The majority of the time was used for counseling and coordination of care.  Betsy Coder, MD  03/30/2019  5:11 PM

## 2019-03-31 ENCOUNTER — Telehealth: Payer: Self-pay

## 2019-03-31 LAB — FERRITIN: Ferritin: 797 ng/mL — ABNORMAL HIGH (ref 24–336)

## 2019-03-31 NOTE — Telephone Encounter (Signed)
-----   Message from [name], MD sent at 03/30/2019  8:25 PM EDT ----- Please call patient, hb is improved, call for increased pain, fever

## 2019-03-31 NOTE — Telephone Encounter (Signed)
Called patient and spoke to his wife. Wife made aware of Dr. Gearldine Shown instructions and verbalized understanding.

## 2019-04-03 ENCOUNTER — Telehealth: Payer: Self-pay | Admitting: Oncology

## 2019-04-03 NOTE — Telephone Encounter (Signed)
Called and spoke with patient. Confirmed change in time of 4/5 appt per MD request

## 2019-04-07 ENCOUNTER — Other Ambulatory Visit: Payer: Self-pay | Admitting: *Deleted

## 2019-04-07 DIAGNOSIS — C799 Secondary malignant neoplasm of unspecified site: Secondary | ICD-10-CM

## 2019-04-10 ENCOUNTER — Other Ambulatory Visit: Payer: Self-pay

## 2019-04-10 ENCOUNTER — Inpatient Hospital Stay: Payer: Medicare Other

## 2019-04-10 ENCOUNTER — Other Ambulatory Visit: Payer: Medicare Other

## 2019-04-10 ENCOUNTER — Inpatient Hospital Stay: Payer: Medicare Other | Attending: Oncology

## 2019-04-10 ENCOUNTER — Inpatient Hospital Stay (HOSPITAL_BASED_OUTPATIENT_CLINIC_OR_DEPARTMENT_OTHER): Payer: Medicare Other | Admitting: Nurse Practitioner

## 2019-04-10 ENCOUNTER — Encounter: Payer: Self-pay | Admitting: Nurse Practitioner

## 2019-04-10 ENCOUNTER — Ambulatory Visit: Payer: Medicare Other | Admitting: Oncology

## 2019-04-10 VITALS — BP 128/63 | HR 100 | Temp 99.1°F | Resp 18 | Ht 70.0 in | Wt 148.3 lb

## 2019-04-10 DIAGNOSIS — R109 Unspecified abdominal pain: Secondary | ICD-10-CM | POA: Insufficient documentation

## 2019-04-10 DIAGNOSIS — C7889 Secondary malignant neoplasm of other digestive organs: Secondary | ICD-10-CM | POA: Insufficient documentation

## 2019-04-10 DIAGNOSIS — C184 Malignant neoplasm of transverse colon: Secondary | ICD-10-CM | POA: Insufficient documentation

## 2019-04-10 DIAGNOSIS — R110 Nausea: Secondary | ICD-10-CM | POA: Insufficient documentation

## 2019-04-10 DIAGNOSIS — R5381 Other malaise: Secondary | ICD-10-CM | POA: Insufficient documentation

## 2019-04-10 DIAGNOSIS — Z5111 Encounter for antineoplastic chemotherapy: Secondary | ICD-10-CM | POA: Diagnosis not present

## 2019-04-10 DIAGNOSIS — C189 Malignant neoplasm of colon, unspecified: Secondary | ICD-10-CM | POA: Diagnosis not present

## 2019-04-10 DIAGNOSIS — Z5189 Encounter for other specified aftercare: Secondary | ICD-10-CM | POA: Diagnosis not present

## 2019-04-10 DIAGNOSIS — C799 Secondary malignant neoplasm of unspecified site: Secondary | ICD-10-CM | POA: Diagnosis not present

## 2019-04-10 DIAGNOSIS — Z95828 Presence of other vascular implants and grafts: Secondary | ICD-10-CM

## 2019-04-10 DIAGNOSIS — R630 Anorexia: Secondary | ICD-10-CM | POA: Diagnosis not present

## 2019-04-10 LAB — CBC WITH DIFFERENTIAL (CANCER CENTER ONLY)
Abs Immature Granulocytes: 0.17 10*3/uL — ABNORMAL HIGH (ref 0.00–0.07)
Basophils Absolute: 0.2 10*3/uL — ABNORMAL HIGH (ref 0.0–0.1)
Basophils Relative: 1 %
Eosinophils Absolute: 0.2 10*3/uL (ref 0.0–0.5)
Eosinophils Relative: 1 %
HCT: 29.9 % — ABNORMAL LOW (ref 39.0–52.0)
Hemoglobin: 9.3 g/dL — ABNORMAL LOW (ref 13.0–17.0)
Immature Granulocytes: 1 %
Lymphocytes Relative: 7 %
Lymphs Abs: 1.5 10*3/uL (ref 0.7–4.0)
MCH: 26.3 pg (ref 26.0–34.0)
MCHC: 31.1 g/dL (ref 30.0–36.0)
MCV: 84.7 fL (ref 80.0–100.0)
Monocytes Absolute: 2.4 10*3/uL — ABNORMAL HIGH (ref 0.1–1.0)
Monocytes Relative: 11 %
Neutro Abs: 16.7 10*3/uL — ABNORMAL HIGH (ref 1.7–7.7)
Neutrophils Relative %: 79 %
Platelet Count: 530 10*3/uL — ABNORMAL HIGH (ref 150–400)
RBC: 3.53 MIL/uL — ABNORMAL LOW (ref 4.22–5.81)
RDW: 20.4 % — ABNORMAL HIGH (ref 11.5–15.5)
WBC Count: 21 10*3/uL — ABNORMAL HIGH (ref 4.0–10.5)
nRBC: 0 % (ref 0.0–0.2)

## 2019-04-10 MED ORDER — SODIUM CHLORIDE 0.9% FLUSH
10.0000 mL | Freq: Once | INTRAVENOUS | Status: AC | PRN
  Administered 2019-04-10: 10 mL
  Filled 2019-04-10: qty 10

## 2019-04-10 MED ORDER — HEPARIN SOD (PORK) LOCK FLUSH 100 UNIT/ML IV SOLN
500.0000 [IU] | Freq: Once | INTRAVENOUS | Status: AC | PRN
  Administered 2019-04-10: 15:00:00 500 [IU]
  Filled 2019-04-10: qty 5

## 2019-04-10 NOTE — Progress Notes (Addendum)
[facility] OFFICE PROGRESS NOTE   Diagnosis: Colon cancer  INTERVAL HISTORY:   Cody Suarez returns as scheduled.  He continues to have intermittent left-sided abdominal pain.  He typically alternates Tylenol and hydrocodone.  He occasionally takes Dilaudid.  Some nausea.  Bowels are moving.  He denies bleeding.  Appetite is poor.  Objective:  Vital signs in last 24 hours:  Blood pressure 128/63, pulse 100, temperature 99.1 F (37.3 C), temperature source Temporal, resp. rate 18, height 5' 10"  (1.778 m), weight 148 lb 4.8 oz (67.3 kg), SpO2 99 %.    Vascular: No leg edema. Neuro: Alert and oriented. Port-A-Cath without erythema.  Lab Results:  Lab Results  Component Value Date   WBC 21.0 (H) 04/10/2019   HGB 9.3 (L) 04/10/2019   HCT 29.9 (L) 04/10/2019   MCV 84.7 04/10/2019   PLT 530 (H) 04/10/2019   NEUTROABS 16.7 (H) 04/10/2019    Imaging:  No results found.  Medications: I have reviewed the patient's current medications.  Assessment/Plan: 1. 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 Negative for a BRAF mutation  Tumor invaded through the muscularis propria into pericolonic fatty tissue and involved the attached omentum.   Cycle 1 adjuvant Xeloda 05/28/2013.   Cycle 2 adjuvant Xeloda 06/18/2013.   Cycle 3 adjuvant Xeloda 07/09/2013.   Cycle 4 adjuvant Xeloda 07/30/2013.   Cycle 5 adjuvant Xeloda 08/20/2013.  Cycle 6 adjuvant Xeloda 09/11/2013.  cycle 7 adjuvant Xeloda 10/02/2013  Cycle 8 adjuvant Xeloda 10/23/2013  CTs of the chest, abdomen, and pelvis on 07/18/2014-negative for recurrent colon cancer   CTs abdomen/pelvis 09/06/2014 showed acute cholecystitis.  Colonoscopy 08/02/2015-chronic colitis, no malignancy  CTs 09/17/2015, new mass near the  tail the pancreas, stable lung nodules  Biopsy mesenteric mass 10/04/2015 with metastatic adenocarcinoma consistent with colorectal primary  PET scan 10/10/2015 with soft tissue thickening within the peritoneal space of the left upper quadrant with SUV Max 7.9; no additional sites of peritoneal nodularity to suggest metastasis; hypermetabolic ill-defined nodule in the left lower lobe favored inflammatory.  Resection of the left abdominal mesenteric mass/tail the pancreas on 11/05/2015 with the pathology confirming metastatic colon cancer, positive "serosal" margin  CTs 07/29/2016-no evidence of metastatic disease, stable small lung nodules, decreased size of pancreatic resection bed fluid/mass  CTs 06/28/2017-decreased fluid and increased soft tissue at the area of the distal pancreatectomy with a few foci of gas  CT 09/29/2017-enlargement of left upper quadrant soft tissue mass between the stomach, spleen, and pancreas tail, stable nodules at the lung bases  CT biopsy of the upper quadrant mass 10/19/2017-metastatic colon cancer, MSS, tumor mutation burden 1, no KRAS, BRAF, or NRAS mutation  CTs 12/24/2017-complex left upper quadrant process involving the stomach, spleen, tail the pancreas, and distal transverse colon.  CT 04/27/2018-stable to mild progression of the left upper quadrant mass, compression of the stomach with no obstruction, no other evidence of metastatic disease, resolution of small fluid collection lateral to the left upper quadrant mass  CT 07/21/2018-enlargement of the left upper quadrant mass with involvement of the posterior stomach and spleen. Possible new right upper lung nodule  Cycle 1 FOLFOX 08/17/2018  Cycle 2 FOLFOX 08/31/2018, Udenyca added  Cycle 3 FOLFOX 09/14/2018, Udenyca  Cycle 4 FOLFOX 09/29/2018, oxaliplatin and 5-fluorouracil dose reduced 5-FU bolus held  CT abdomen/pelvis 10/12/2018-decrease in size of mass at the  pancreas tail/posterior gastric wall,  and splenic flexure of the colon, mass is more cystic  CT abdomen/pelvis 03/30/2019-significant enlargement of the left upper quadrant mass with invasion of the stomach, pancreas, spleen, and colon, small left pleural effusion 2. Ulcerative colitis-extensive chronic active ulcerative colitis was noted on the colon resection specimen 03/28/2013.  Followed by Dr.Magod 3. Hypertension.  4. History of microcytic anemia-likely iron deficiency, unable to tolerate oral iron.  5. Possible area of cecal wall thickening noted on abdominal CT 03/20/2013. 6. Family history of colon cancer (maternal grandfather died in his 67s with colon cancer). 7. Bilateral calf and low anterior leg pain. Bilateral lower extremity venous duplex negative for DVT 07/14/2013. Right ABI with moderate, borderline severe arterial insufficiency; left ABI suggestive of moderate arterial insufficiency. He was referred to vascular. 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 TEE showed thrombus in the descending aorta. He underwent right popliteal to peroneal artery bypass graft, thrombectomy anterior tibialis and attempted thrombectomy tibioperoneal trunk and posterior tibialis on 08/11/2013. Right calf pain 09/26/2013 with findings of occlusion of right popliteal to peroneal bypass status post thrombolysis. Now on anticoagulation with Xarelto.  recurrent pain and ulceration at the right foot, status post a popliteal to posterior tibial artery bypass using a cephalic vein graft on 76/16/0737  Xarelto discontinued due to high risk for bleeding and aspirin 81 mg daily initiated hospitalization July 2020 8. Status post laparoscopic cholecystectomy 09/08/2014 9. Anorexia following the mesenteric mass/pancreas tail resection-improved 10. Abdominal abscess November 2017-pancreas "cysts "drainage catheter placed 11/26/2015; CT 02/04/2016 with no change to slight decreasein size of residual pancreatic tail fluid  collection.Drainage catheter removed approximately early March 2018  CT 02/20/2016-new small left pleural effusion, increased size of the surgical bed fluid collection, indeterminate pulmonary nodules  CT 07/29/2016-decreased fluid/soft tissue density near the pancreas is no evidence of metastatic disease, stable subcentimeter lung nodules 11.Admission 12/24/2017 with an abdominal abscess, status post placement of a percutaneous drain and antibiotic therapy; CTs abdomen/pelvis 01/04/2018-near complete drainage of previously noted complex fluid collection in the left upper quadrant. 2 small residual collections remain. Residual soft tissue mass at the clips concerning for residual recurrent neoplasm. Peritoneal wall thickening also concerning for metastatic disease. Drain removed 01/04/2018  CT 01/18/2018-no change in soft tissue mass at the tail of pancreas with involvement of the gastric fundus and spleen, there is central low attenuation  Admission 01/20/2018 with fever and failure to thrive, no drainable fluid collection, placed on IV antibiotics, discharged to complete an outpatient course of IV ceftriaxone and Flagyl completed 02/20/2018  12.Admission 7/26/2020transient speech abnormality and microcytic anemia  13.Port-A-Cath placement, Dr. Hassell Done, 08/15/2018   Disposition: Cody Suarez has metastatic colon cancer with evidence of progression on recent CT scans.  He is symptomatic with left-sided abdominal pain.  Dr. Benay Spice had discussed comfort care/hospice versus a trial of salvage therapy at his last appointment on 03/30/2019.  Cody Suarez was undecided on whether to proceed with treatment.  He is seen today for further discussion.  He remains undecided.  He has an appointment with Dr. Birdie Riddle 04/13/2019.  He plans to contact our office with his decision on 04/14/2019.  We scheduled a return visit, possible FOLFOX 04/18/2019.  Patient seen with Dr. Benay Spice.    Ned Card  ANP/GNP-BC   04/10/2019  3:27 PM This was a shared visit with Ned Card.  Cody Suarez continues to be symptomatic with anorexia, malaise, and pain.  We discussed treatment options again today.  He would like  to delay a final decision on resuming chemotherapy until he meets with Dr. Birdie Riddle later this week.  He will contact us on 04/14/2019.  Orders are placed to begin FOLFOX on 04/18/2019.  Julieanne Manson, MD

## 2019-04-13 ENCOUNTER — Encounter: Payer: Self-pay | Admitting: Family Medicine

## 2019-04-13 ENCOUNTER — Other Ambulatory Visit: Payer: Self-pay

## 2019-04-13 ENCOUNTER — Telehealth (INDEPENDENT_AMBULATORY_CARE_PROVIDER_SITE_OTHER): Payer: Medicare Other | Admitting: Family Medicine

## 2019-04-13 ENCOUNTER — Telehealth: Payer: Self-pay | Admitting: *Deleted

## 2019-04-13 VITALS — Ht 70.0 in | Wt 144.0 lb

## 2019-04-13 DIAGNOSIS — C7889 Secondary malignant neoplasm of other digestive organs: Secondary | ICD-10-CM

## 2019-04-13 NOTE — Progress Notes (Signed)
I have discussed the procedure for the virtual visit with the patient who has given consent to proceed with assessment and treatment.   Jordy Hewins L Coty Larsh, CMA     

## 2019-04-13 NOTE — Progress Notes (Signed)
Virtual Visit via Video   I connected with patient on 04/13/19 at 11:00 AM EDT by a video enabled telemedicine application and verified that I am speaking with the correct person using two identifiers.  Location patient: Home Location provider: [facility], Office Persons participating in the virtual visit: Patient, Provider, [place] (Jess B)  I discussed the limitations of evaluation and management by telemedicine and the availability of in person appointments. The patient expressed understanding and agreed to proceed.  Subjective:   HPI:   Possible Hospice Referral- pt w/ metastatic colon cancer.  Pt reports being 'weak and shaky' but 'not having a whole lot of pain'.  Pt had a discussion w/ Dr Benay Spice and he wants to try treatment again.  He was not able to tolerate the last few treatments of his previous regimen b/c it made him too sick.  'I couldn't get out of bed'.  Plan is to lower the dose and see what he can tolerate bc 'I don't want to feel like i'm giving up on myself'.  He has questions regarding Hospice as this is his plan if he can't tolerate tx.    ROS:   See pertinent positives and negatives per HPI.  Patient Active Problem List   Diagnosis Date Noted  . Port-A-Cath in place 08/17/2018  . Goals of care, counseling/discussion 08/02/2018  . TIA (transient ischemic attack) 08/01/2018  . Colon adenocarcinoma ([place])   . Metastatic adenocarcinoma ([place])   . Anemia 07/31/2018  . Speech abnormality 07/31/2018  . Malnutrition of moderate degree 01/21/2018  . Intra-abdominal abscess ([place]) 12/24/2017  . Hyperlipidemia 11/12/2017  . Erectile dysfunction 05/05/2017  . 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 History of kidney stones   . PAD (peripheral artery disease) ([place]) 01/22/2015  . Anemia of chronic disease 09/06/2014  . 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 Atherosclerotic PVD with ulceration ([place]) 11/14/2013  .  Atherosclerotic PVD with intermittent claudication ([place]) 10/31/2013  . 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 Hypertension   . Arterial thromboembolism ([place]) 08/09/2013  . 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 Right inguinal hernia 03/23/2013  . Umbilical hernia 69/62/9528  . Ulcerative colitis ([place]) 03/23/2013    Social History   Tobacco Use  . Smoking status: Never Smoker  . Smokeless tobacco: Never Used  . Tobacco comment: states his father died of emphysema from smoking, he did not want to be like that  Substance Use Topics  . Alcohol use: No    Current Outpatient Medications:  .  acetaminophen (TYLENOL) 500 MG tablet, Take 1,000 mg by mouth daily as needed for mild pain or headache. , Disp: , Rfl:  .  aspirin EC 81 MG tablet, Take 1 tablet (81 mg total) by mouth daily., Disp: 90 tablet, Rfl: 3 .  calcium carbonate (TUMS EX) 750 MG chewable tablet, Chew 1-2 tablets by mouth daily as needed for heartburn., Disp: , Rfl:  .  Homeopathic Products [facility] RELIEF EX), Apply 1 application topically daily as needed (calf pain)., Disp: , Rfl:  .  HYDROcodone-acetaminophen (NORCO/VICODIN) 5-325 MG tablet, Take 1 tablet by mouth every 6 (six) hours as needed for moderate pain., Disp: 15 tablet, Rfl: 0 .  HYDROmorphone (DILAUDID) 2 MG tablet, Take 1-2 tablets (2-4 mg total) by mouth every 4 (four) hours as needed for severe pain., Disp: 40 tablet, Rfl: 0 .  lidocaine-prilocaine (EMLA) cream, Apply 1 tsp to port site 1  hour prior to stick and cover with plastic wrap, Disp: 30 g, Rfl: 1 .  Liniments (BLUE-EMU SUPER STRENGTH EX), Apply 1 application topically daily as needed (knee pain)., Disp: , Rfl:  .  potassium chloride (MICRO-K) 10 MEQ CR capsule, Take 2 tablets daily, Disp: 60 capsule, Rfl: 1 .  potassium chloride SA (KLOR-CON) 20 MEQ tablet, Take 1 tablet (20 mEq total) by mouth 2 (two) times daily., Disp: 60 tablet, Rfl: 1 .  predniSONE  (DELTASONE) 10 MG tablet, Take 1 tablet by mouth once daily with food, Disp: 30 tablet, Rfl: 2 .  prochlorperazine (COMPAZINE) 10 MG tablet, Take 1 tablet (10 mg total) by mouth every 6 (six) hours as needed for nausea., Disp: 60 tablet, Rfl: 1 .  spironolactone (ALDACTONE) 25 MG tablet, Take 1 tablet by mouth once daily, Disp: 90 tablet, Rfl: 1 .  sulfaSALAzine (AZULFIDINE) 500 MG EC tablet, Take 1,500 mg by mouth daily. , Disp: , Rfl: 3 .  vitamin B-12 (CYANOCOBALAMIN) 1000 MCG tablet, Take 1,000 mcg by mouth daily., Disp: , Rfl:  .  losartan (COZAAR) 100 MG tablet, Take 1 tablet (100 mg total) by mouth daily., Disp: 90 tablet, Rfl: 1  Allergies  Allergen Reactions  . Amlodipine Swelling    Right LE edema  . Gabapentin Rash    Short-term memory decrease    Objective:   Ht 5' 10"  (1.778 m)   Wt 144 lb (65.3 kg)   BMI 20.66 kg/m   AAOx3, NAD NCAT, EOMI No obvious CN deficits Coloring WNL Pt is able to speak clearly, coherently without shortness of breath or increased work of breathing.  Thought process is linear.  Mood is appropriate.   Assessment and Plan:   Metastatic colon cancer- ongoing issue for pt.  He has decided to attempt treatment again.  He has talked with his wife and children and they are all on the same page that if the treatment again makes him sick (even at a reduced dose) he will stop treatment and start Hospice.  That being said, he doesn't really know what Hospice is.  Discussed that they are there to provide comfort and support to both pt and family.  They can provide counseling or spiritual services.  They can do in home pain management.  They can help get supplies if needed- such as a hospital bed.  They can provide respite care for family members.  I told him that their goal is to provide care, comfort, support, and dignity.  He seemed very pleased with this.  Total time spent w/ pt and family via video 30 minutes and >50% spent counseling   Annye Asa,  MD 04/13/2019

## 2019-04-13 NOTE — Telephone Encounter (Signed)
Notified patient to be here on 4/13 at 0845 for lab via port and get started on treatment again.

## 2019-04-13 NOTE — Telephone Encounter (Signed)
Patient called to report he has decided he wants to purse chemotherapy again, possibly with lower dose to help him tolerate it. If it is too hard on him, he will then agree to Hospice. MD notified and message sent to scheduling to work on the LOS for 04/10/19.

## 2019-04-14 NOTE — Progress Notes (Signed)
Pharmacist Chemotherapy Monitoring - Follow Up Assessment    I verify that I have reviewed each item in the below checklist:  . Regimen for the patient is scheduled for the appropriate day and plan matches scheduled date. Marland Kitchen Appropriate non-routine labs are ordered dependent on drug ordered. . If applicable, additional medications reviewed and ordered per protocol based on lifetime cumulative doses and/or treatment regimen.   Plan for follow-up and/or issues identified: No . I-vent associated with next due treatment: No . MD and/or nursing notified: No  Acquanetta Belling 04/14/2019 10:54 AM

## 2019-04-16 ENCOUNTER — Other Ambulatory Visit: Payer: Self-pay | Admitting: Oncology

## 2019-04-17 NOTE — Progress Notes (Signed)
Pharmacist Chemotherapy Monitoring - Follow Up Assessment    I verify that I have reviewed each item in the below checklist:  . Regimen for the patient is scheduled for the appropriate day and plan matches scheduled date. Marland Kitchen Appropriate non-routine labs are ordered dependent on drug ordered. . If applicable, additional medications reviewed and ordered per protocol based on lifetime cumulative doses and/or treatment regimen.   Plan for follow-up and/or issues identified: No . I-vent associated with next due treatment: No . MD and/or nursing notified: No  Romualdo Bolk [facility] 04/17/2019 2:38 PM

## 2019-04-18 ENCOUNTER — Other Ambulatory Visit: Payer: Self-pay

## 2019-04-18 ENCOUNTER — Inpatient Hospital Stay: Payer: Medicare Other

## 2019-04-18 ENCOUNTER — Inpatient Hospital Stay (HOSPITAL_BASED_OUTPATIENT_CLINIC_OR_DEPARTMENT_OTHER): Payer: Medicare Other | Admitting: Oncology

## 2019-04-18 VITALS — HR 100

## 2019-04-18 VITALS — BP 136/62 | HR 115 | Temp 98.7°F | Resp 20 | Ht 70.0 in | Wt 143.9 lb

## 2019-04-18 DIAGNOSIS — R109 Unspecified abdominal pain: Secondary | ICD-10-CM | POA: Diagnosis not present

## 2019-04-18 DIAGNOSIS — C189 Malignant neoplasm of colon, unspecified: Secondary | ICD-10-CM

## 2019-04-18 DIAGNOSIS — C184 Malignant neoplasm of transverse colon: Secondary | ICD-10-CM | POA: Diagnosis not present

## 2019-04-18 DIAGNOSIS — Z95828 Presence of other vascular implants and grafts: Secondary | ICD-10-CM

## 2019-04-18 DIAGNOSIS — R110 Nausea: Secondary | ICD-10-CM | POA: Diagnosis not present

## 2019-04-18 DIAGNOSIS — C7889 Secondary malignant neoplasm of other digestive organs: Secondary | ICD-10-CM | POA: Diagnosis not present

## 2019-04-18 DIAGNOSIS — R5381 Other malaise: Secondary | ICD-10-CM | POA: Diagnosis not present

## 2019-04-18 DIAGNOSIS — C799 Secondary malignant neoplasm of unspecified site: Secondary | ICD-10-CM

## 2019-04-18 DIAGNOSIS — Z5111 Encounter for antineoplastic chemotherapy: Secondary | ICD-10-CM | POA: Diagnosis not present

## 2019-04-18 LAB — CMP (CANCER CENTER ONLY)
ALT: 7 U/L (ref 0–44)
AST: 12 U/L — ABNORMAL LOW (ref 15–41)
Albumin: 2.9 g/dL — ABNORMAL LOW (ref 3.5–5.0)
Alkaline Phosphatase: 66 U/L (ref 38–126)
Anion gap: 10 (ref 5–15)
BUN: 15 mg/dL (ref 8–23)
CO2: 24 mmol/L (ref 22–32)
Calcium: 8.8 mg/dL — ABNORMAL LOW (ref 8.9–10.3)
Chloride: 100 mmol/L (ref 98–111)
Creatinine: 1.05 mg/dL (ref 0.61–1.24)
GFR, Est AFR Am: >60 mL/min (ref >60)
GFR, Estimated: >60 mL/min (ref >60)
Glucose, Bld: 224 mg/dL — ABNORMAL HIGH (ref 70–99)
Potassium: 3.8 mmol/L (ref 3.5–5.1)
Sodium: 134 mmol/L — ABNORMAL LOW (ref 135–145)
Total Bilirubin: <0.2 mg/dL — ABNORMAL LOW (ref 0.3–1.2)
Total Protein: 7.3 g/dL (ref 6.5–8.1)

## 2019-04-18 LAB — CBC WITH DIFFERENTIAL (CANCER CENTER ONLY)
Abs Immature Granulocytes: 0.18 10*3/uL — ABNORMAL HIGH (ref 0.00–0.07)
Basophils Absolute: 0.1 10*3/uL (ref 0.0–0.1)
Basophils Relative: 1 %
Eosinophils Absolute: 0.1 10*3/uL (ref 0.0–0.5)
Eosinophils Relative: 1 %
HCT: 28.1 % — ABNORMAL LOW (ref 39.0–52.0)
Hemoglobin: 8.9 g/dL — ABNORMAL LOW (ref 13.0–17.0)
Immature Granulocytes: 1 %
Lymphocytes Relative: 5 %
Lymphs Abs: 1.1 10*3/uL (ref 0.7–4.0)
MCH: 26.8 pg (ref 26.0–34.0)
MCHC: 31.7 g/dL (ref 30.0–36.0)
MCV: 84.6 fL (ref 80.0–100.0)
Monocytes Absolute: 2 10*3/uL — ABNORMAL HIGH (ref 0.1–1.0)
Monocytes Relative: 10 %
Neutro Abs: 16.6 10*3/uL — ABNORMAL HIGH (ref 1.7–7.7)
Neutrophils Relative %: 82 %
Platelet Count: 420 10*3/uL — ABNORMAL HIGH (ref 150–400)
RBC: 3.32 MIL/uL — ABNORMAL LOW (ref 4.22–5.81)
RDW: 21.9 % — ABNORMAL HIGH (ref 11.5–15.5)
WBC Count: 20.2 10*3/uL — ABNORMAL HIGH (ref 4.0–10.5)
nRBC: 0 % (ref 0.0–0.2)

## 2019-04-18 LAB — CEA (IN HOUSE-CHCC): CEA (CHCC-In House): 3.56 ng/mL (ref 0.00–5.00)

## 2019-04-18 MED ORDER — OXALIPLATIN CHEMO INJECTION 100 MG/20ML
50.0000 mg/m2 | Freq: Once | INTRAVENOUS | Status: AC
  Administered 2019-04-18: 90 mg via INTRAVENOUS
  Filled 2019-04-18: qty 18

## 2019-04-18 MED ORDER — SODIUM CHLORIDE 0.9% FLUSH
10.0000 mL | Freq: Once | INTRAVENOUS | Status: AC
  Administered 2019-04-18: 10 mL
  Filled 2019-04-18: qty 10

## 2019-04-18 MED ORDER — PALONOSETRON HCL INJECTION 0.25 MG/5ML
INTRAVENOUS | Status: AC
  Filled 2019-04-18: qty 5

## 2019-04-18 MED ORDER — SODIUM CHLORIDE 0.9 % IV SOLN
10.0000 mg | Freq: Once | INTRAVENOUS | Status: AC
  Administered 2019-04-18: 10:00:00 10 mg via INTRAVENOUS
  Filled 2019-04-18: qty 10

## 2019-04-18 MED ORDER — PALONOSETRON HCL INJECTION 0.25 MG/5ML
0.2500 mg | Freq: Once | INTRAVENOUS | Status: AC
  Administered 2019-04-18: 0.25 mg via INTRAVENOUS

## 2019-04-18 MED ORDER — DEXTROSE 5 % IV SOLN
Freq: Once | INTRAVENOUS | Status: AC
  Filled 2019-04-18: qty 250

## 2019-04-18 MED ORDER — SODIUM CHLORIDE 0.9% FLUSH
10.0000 mL | INTRAVENOUS | Status: DC | PRN
  Filled 2019-04-18: qty 10

## 2019-04-18 MED ORDER — HEPARIN SOD (PORK) LOCK FLUSH 100 UNIT/ML IV SOLN
500.0000 [IU] | Freq: Once | INTRAVENOUS | Status: DC | PRN
  Filled 2019-04-18: qty 5

## 2019-04-18 MED ORDER — SODIUM CHLORIDE 0.9 % IV SOLN
1800.0000 mg/m2 | INTRAVENOUS | Status: DC
  Administered 2019-04-18: 14:00:00 3250 mg via INTRAVENOUS
  Filled 2019-04-18: qty 65

## 2019-04-18 MED ORDER — LEUCOVORIN CALCIUM INJECTION 350 MG
300.0000 mg/m2 | Freq: Once | INTRAVENOUS | Status: AC
  Administered 2019-04-18: 11:00:00 540 mg via INTRAVENOUS
  Filled 2019-04-18: qty 27

## 2019-04-18 NOTE — Patient Instructions (Signed)
[facility] Discharge Instructions for Patients Receiving Chemotherapy  Today you received the following chemotherapy agents: Oxaliplatin, Leucovorin, Fluorouracil injection and pump   To help prevent nausea and vomiting after your treatment, we encourage you to take your nausea medication as directed.    If you develop nausea and vomiting that is not controlled by your nausea medication, call the clinic.   BELOW ARE SYMPTOMS THAT SHOULD BE REPORTED IMMEDIATELY:  *FEVER GREATER THAN 100.5 F  *CHILLS WITH OR WITHOUT FEVER  NAUSEA AND VOMITING THAT IS NOT CONTROLLED WITH YOUR NAUSEA MEDICATION  *UNUSUAL SHORTNESS OF BREATH  *UNUSUAL BRUISING OR BLEEDING  TENDERNESS IN MOUTH AND THROAT WITH OR WITHOUT PRESENCE OF ULCERS  *URINARY PROBLEMS  *BOWEL PROBLEMS  UNUSUAL RASH Items with * indicate a potential emergency and should be followed up as soon as possible.  Feel free to call the clinic should you have any questions or concerns. The clinic phone number is [phone].  Please show the [facility] at check-in to the Emergency Department and triage nurse.    Leucovorin injection What is this medicine? LEUCOVORIN (loo koe VOR in) is used to prevent or treat the harmful effects of some medicines. This medicine is used to treat anemia caused by a low amount of folic acid in the body. It is also used with 5-fluorouracil (5-FU) to treat colon cancer. This medicine may be used for other purposes; ask your health care provider or pharmacist if you have questions. What should I tell my health care provider before I take this medicine? They need to know if you have any of these conditions:  anemia from low levels of vitamin B-12 in the blood  an unusual or allergic reaction to leucovorin, folic acid, other medicines, foods, dyes, or preservatives  pregnant or trying to get pregnant  breast-feeding How should I use this medicine? This medicine is for  injection into a muscle or into a vein. It is given by a health care professional in a hospital or clinic setting. Talk to your pediatrician regarding the use of this medicine in children. Special care may be needed. Overdosage: If you think you have taken too much of this medicine contact a poison control center or emergency room at once. NOTE: This medicine is only for you. Do not share this medicine with others. What if I miss a dose? This does not apply. What may interact with this medicine?  capecitabine  fluorouracil  phenobarbital  phenytoin  primidone  trimethoprim-sulfamethoxazole This list may not describe all possible interactions. Give your health care provider a list of all the medicines, herbs, non-prescription drugs, or dietary supplements you use. Also tell them if you smoke, drink alcohol, or use illegal drugs. Some items may interact with your medicine. What should I watch for while using this medicine? Your condition will be monitored carefully while you are receiving this medicine. This medicine may increase the side effects of 5-fluorouracil, 5-FU. Tell your doctor or health care professional if you have diarrhea or mouth sores that do not get better or that get worse. What side effects may I notice from receiving this medicine? Side effects that you should report to your doctor or health care professional as soon as possible:  allergic reactions like skin rash, itching or hives, swelling of the face, lips, or tongue  breathing problems  fever, infection  mouth sores  unusual bleeding or bruising  unusually weak or tired Side effects that usually do not require  medical attention (report to your doctor or health care professional if they continue or are bothersome):  constipation or diarrhea  loss of appetite  nausea, vomiting This list may not describe all possible side effects. Call your doctor for medical advice about side effects. You may report  side effects to FDA at 1-800-FDA-1088. Where should I keep my medicine? This drug is given in a hospital or clinic and will not be stored at home. NOTE: This sheet is a summary. It may not cover all possible information. If you have questions about this medicine, talk to your doctor, pharmacist, or health care provider.  2020 Elsevier/Gold Standard (2007-06-28 16:50:29) Fluorouracil, 5-FU injection What is this medicine? FLUOROURACIL, 5-FU (flure oh YOOR a sil) is a chemotherapy drug. It slows the growth of cancer cells. This medicine is used to treat many types of cancer like breast cancer, colon or rectal cancer, pancreatic cancer, and stomach cancer. This medicine may be used for other purposes; ask your health care provider or pharmacist if you have questions. COMMON BRAND NAME(S): Adrucil What should I tell my health care provider before I take this medicine? They need to know if you have any of these conditions:  blood disorders  dihydropyrimidine dehydrogenase (DPD) deficiency  infection (especially a virus infection such as chickenpox, cold sores, or herpes)  kidney disease  liver disease  malnourished, poor nutrition  recent or ongoing radiation therapy  an unusual or allergic reaction to fluorouracil, other chemotherapy, other medicines, foods, dyes, or preservatives  pregnant or trying to get pregnant  breast-feeding How should I use this medicine? This drug is given as an infusion or injection into a vein. It is administered in a hospital or clinic by a specially trained health care professional. Talk to your pediatrician regarding the use of this medicine in children. Special care may be needed. Overdosage: If you think you have taken too much of this medicine contact a poison control center or emergency room at once. NOTE: This medicine is only for you. Do not share this medicine with others. What if I miss a dose? It is important not to miss your dose. Call your  doctor or health care professional if you are unable to keep an appointment. What may interact with this medicine?  allopurinol  cimetidine  dapsone  digoxin  hydroxyurea  leucovorin  levamisole  medicines for seizures like ethotoin, fosphenytoin, phenytoin  medicines to increase blood counts like filgrastim, pegfilgrastim, sargramostim  medicines that treat or prevent blood clots like warfarin, enoxaparin, and dalteparin  methotrexate  metronidazole  pyrimethamine  some other chemotherapy drugs like busulfan, cisplatin, estramustine, vinblastine  trimethoprim  trimetrexate  vaccines Talk to your doctor or health care professional before taking any of these medicines:  acetaminophen  aspirin  ibuprofen  ketoprofen  naproxen This list may not describe all possible interactions. Give your health care provider a list of all the medicines, herbs, non-prescription drugs, or dietary supplements you use. Also tell them if you smoke, drink alcohol, or use illegal drugs. Some items may interact with your medicine. What should I watch for while using this medicine? Visit your doctor for checks on your progress. This drug may make you feel generally unwell. This is not uncommon, as chemotherapy can affect healthy cells as well as cancer cells. Report any side effects. Continue your course of treatment even though you feel ill unless your doctor tells you to stop. In some cases, you may be given additional medicines to help with  side effects. Follow all directions for their use. Call your doctor or health care professional for advice if you get a fever, chills or sore throat, or other symptoms of a cold or flu. Do not treat yourself. This drug decreases your body's ability to fight infections. Try to avoid being around people who are sick. This medicine may increase your risk to bruise or bleed. Call your doctor or health care professional if you notice any unusual  bleeding. Be careful brushing and flossing your teeth or using a toothpick because you may get an infection or bleed more easily. If you have any dental work done, tell your dentist you are receiving this medicine. Avoid taking products that contain aspirin, acetaminophen, ibuprofen, naproxen, or ketoprofen unless instructed by your doctor. These medicines may hide a fever. Do not become pregnant while taking this medicine. Women should inform their doctor if they wish to become pregnant or think they might be pregnant. There is a potential for serious side effects to an unborn child. Talk to your health care professional or pharmacist for more information. Do not breast-feed an infant while taking this medicine. Men should inform their doctor if they wish to father a child. This medicine may lower sperm counts. Do not treat diarrhea with over the counter products. Contact your doctor if you have diarrhea that lasts more than 2 days or if it is severe and watery. This medicine can make you more sensitive to the sun. Keep out of the sun. If you cannot avoid being in the sun, wear protective clothing and use sunscreen. Do not use sun lamps or tanning beds/booths. What side effects may I notice from receiving this medicine? Side effects that you should report to your doctor or health care professional as soon as possible:  allergic reactions like skin rash, itching or hives, swelling of the face, lips, or tongue  low blood counts - this medicine may decrease the number of white blood cells, red blood cells and platelets. You may be at increased risk for infections and bleeding.  signs of infection - fever or chills, cough, sore throat, pain or difficulty passing urine  signs of decreased platelets or bleeding - bruising, pinpoint red spots on the skin, black, tarry stools, blood in the urine  signs of decreased red blood cells - unusually weak or tired, fainting spells, lightheadedness  breathing  problems  changes in vision  chest pain  mouth sores  nausea and vomiting  pain, swelling, redness at site where injected  pain, tingling, numbness in the hands or feet  redness, swelling, or sores on hands or feet  stomach pain  unusual bleeding Side effects that usually do not require medical attention (report to your doctor or health care professional if they continue or are bothersome):  changes in finger or toe nails  diarrhea  dry or itchy skin  hair loss  headache  loss of appetite  sensitivity of eyes to the light  stomach upset  unusually teary eyes This list may not describe all possible side effects. Call your doctor for medical advice about side effects. You may report side effects to FDA at 1-800-FDA-1088. Where should I keep my medicine? This drug is given in a hospital or clinic and will not be stored at home. NOTE: This sheet is a summary. It may not cover all possible information. If you have questions about this medicine, talk to your doctor, pharmacist, or health care provider.  2020 Elsevier/Gold Standard (2007-04-27 13:53:16) Oxaliplatin  Injection What is this medicine? OXALIPLATIN (ox AL i PLA tin) is a chemotherapy drug. It targets fast dividing cells, like cancer cells, and causes these cells to die. This medicine is used to treat cancers of the colon and rectum, and many other cancers. This medicine may be used for other purposes; ask your health care provider or pharmacist if you have questions. COMMON BRAND NAME(S): Eloxatin What should I tell my health care provider before I take this medicine? They need to know if you have any of these conditions:  kidney disease  an unusual or allergic reaction to oxaliplatin, other chemotherapy, other medicines, foods, dyes, or preservatives  pregnant or trying to get pregnant  breast-feeding How should I use this medicine? This drug is given as an infusion into a vein. It is administered in a  hospital or clinic by a specially trained health care professional. Talk to your pediatrician regarding the use of this medicine in children. Special care may be needed. Overdosage: If you think you have taken too much of this medicine contact a poison control center or emergency room at once. NOTE: This medicine is only for you. Do not share this medicine with others. What if I miss a dose? It is important not to miss a dose. Call your doctor or health care professional if you are unable to keep an appointment. What may interact with this medicine?  medicines to increase blood counts like filgrastim, pegfilgrastim, sargramostim  probenecid  some antibiotics like amikacin, gentamicin, neomycin, polymyxin B, streptomycin, tobramycin  zalcitabine Talk to your doctor or health care professional before taking any of these medicines:  acetaminophen  aspirin  ibuprofen  ketoprofen  naproxen This list may not describe all possible interactions. Give your health care provider a list of all the medicines, herbs, non-prescription drugs, or dietary supplements you use. Also tell them if you smoke, drink alcohol, or use illegal drugs. Some items may interact with your medicine. What should I watch for while using this medicine? Your condition will be monitored carefully while you are receiving this medicine. You will need important blood work done while you are taking this medicine. This medicine can make you more sensitive to cold. Do not drink cold drinks or use ice. Cover exposed skin before coming in contact with cold temperatures or cold objects. When out in cold weather wear warm clothing and cover your mouth and nose to warm the air that goes into your lungs. Tell your doctor if you get sensitive to the cold. This drug may make you feel generally unwell. This is not uncommon, as chemotherapy can affect healthy cells as well as cancer cells. Report any side effects. Continue your course of  treatment even though you feel ill unless your doctor tells you to stop. In some cases, you may be given additional medicines to help with side effects. Follow all directions for their use. Call your doctor or health care professional for advice if you get a fever, chills or sore throat, or other symptoms of a cold or flu. Do not treat yourself. This drug decreases your body's ability to fight infections. Try to avoid being around people who are sick. This medicine may increase your risk to bruise or bleed. Call your doctor or health care professional if you notice any unusual bleeding. Be careful brushing and flossing your teeth or using a toothpick because you may get an infection or bleed more easily. If you have any dental work done, tell your dentist you  are receiving this medicine. Avoid taking products that contain aspirin, acetaminophen, ibuprofen, naproxen, or ketoprofen unless instructed by your doctor. These medicines may hide a fever. Do not become pregnant while taking this medicine. Women should inform their doctor if they wish to become pregnant or think they might be pregnant. There is a potential for serious side effects to an unborn child. Talk to your health care professional or pharmacist for more information. Do not breast-feed an infant while taking this medicine. Call your doctor or health care professional if you get diarrhea. Do not treat yourself. What side effects may I notice from receiving this medicine? Side effects that you should report to your doctor or health care professional as soon as possible:  allergic reactions like skin rash, itching or hives, swelling of the face, lips, or tongue  low blood counts - This drug may decrease the number of white blood cells, red blood cells and platelets. You may be at increased risk for infections and bleeding.  signs of infection - fever or chills, cough, sore throat, pain or difficulty passing urine  signs of decreased  platelets or bleeding - bruising, pinpoint red spots on the skin, black, tarry stools, nosebleeds  signs of decreased red blood cells - unusually weak or tired, fainting spells, lightheadedness  breathing problems  chest pain, pressure  cough  diarrhea  jaw tightness  mouth sores  nausea and vomiting  pain, swelling, redness or irritation at the injection site  pain, tingling, numbness in the hands or feet  problems with balance, talking, walking  redness, blistering, peeling or loosening of the skin, including inside the mouth  trouble passing urine or change in the amount of urine Side effects that usually do not require medical attention (report to your doctor or health care professional if they continue or are bothersome):  changes in vision  constipation  hair loss  loss of appetite  metallic taste in the mouth or changes in taste  stomach pain This list may not describe all possible side effects. Call your doctor for medical advice about side effects. You may report side effects to FDA at 1-800-FDA-1088. Where should I keep my medicine? This drug is given in a hospital or clinic and will not be stored at home. NOTE: This sheet is a summary. It may not cover all possible information. If you have questions about this medicine, talk to your doctor, pharmacist, or health care provider.  2020 Elsevier/Gold Standard (2007-07-19 17:22:47) [facility] Discharge Instructions for Patients receiving Home Portable Chemo Pump    **The bag should finish at 46 hours, 96 hours or 7 days. For example, if your pump is scheduled for 46 hours and it was put on at 4pm, it should finish at 2 pm the day it is scheduled to come off regardless of your appointment time.    Estimated time to finish   _________________________ (Have your nurse fill in)     ** if the display on your pump reads "Low Volume" and it is beeping, take the batteries out of the pump and come to  the cancer center for it to be taken off.   **If the pump alarms go off prior to the pump reading "Low Volume" then call the [phone] and someone can assist you.  **If the plunger comes out and the bag fluid is running out, please use your chemo spill kit to clean up the spill. Do not use paper towels or other house hold products.  **  If you have problems or questions regarding your pump, please call either the [phone] or the cancer center Monday-Friday 8:00am-4:30pm at [phone] and we will assist you.  If you are unable to get assistance then go to [facility] Emergency Room, ask the staff to contact the IV team for assistance.     Influenza (Flu) Vaccine (Inactivated or Recombinant): What You Need to Know 1. Why get vaccinated? Influenza vaccine can prevent influenza (flu). Flu is a contagious disease that spreads around the [place] every year, usually between October and May. Anyone can get the flu, but it is more dangerous for some people. Infants and young children, people 15 years of age and older, pregnant women, and people with certain health conditions or a weakened immune system are at greatest risk of flu complications. Pneumonia, bronchitis, sinus infections and ear infections are examples of flu-related complications. If you have a medical condition, such as heart disease, cancer or diabetes, flu can make it worse. Flu can cause fever and chills, sore throat, muscle aches, fatigue, cough, headache, and runny or stuffy nose. Some people may have vomiting and diarrhea, though this is more common in children than adults. Each year thousands of people in the [place] die from flu, and many more are hospitalized. Flu vaccine prevents millions of illnesses and flu-related visits to the doctor each year. 2. Influenza vaccine CDC recommends everyone 85 months of age and older get vaccinated every flu season. Children 6 months through 37 years of age may need 2  doses during a single flu season. Everyone else needs only 1 dose each flu season. It takes about 2 weeks for protection to develop after vaccination. There are many flu viruses, and they are always changing. Each year a new flu vaccine is made to protect against three or four viruses that are likely to cause disease in the upcoming flu season. Even when the vaccine doesn't exactly match these viruses, it may still provide some protection. Influenza vaccine does not cause flu. Influenza vaccine may be given at the same time as other vaccines. 3. Talk with your health care provider Tell your vaccine provider if the person getting the vaccine:  Has had an allergic reaction after a previous dose of influenza vaccine, or has any severe, life-threatening allergies.  Has ever had Guillain-Barr Syndrome (also called GBS). In some cases, your health care provider may decide to postpone influenza vaccination to a future visit. People with minor illnesses, such as a cold, may be vaccinated. People who are moderately or severely ill should usually wait until they recover before getting influenza vaccine. Your health care provider can give you more information. 4. Risks of a vaccine reaction  Soreness, redness, and swelling where shot is given, fever, muscle aches, and headache can happen after influenza vaccine.  There may be a very small increased risk of Guillain-Barr Syndrome (GBS) after inactivated influenza vaccine (the flu shot). Young children who get the flu shot along with pneumococcal vaccine (PCV13), and/or DTaP vaccine at the same time might be slightly more likely to have a seizure caused by fever. Tell your health care provider if a child who is getting flu vaccine has ever had a seizure. People sometimes faint after medical procedures, including vaccination. Tell your provider if you feel dizzy or have vision changes or ringing in the ears. As with any medicine, there is a very remote  chance of a vaccine causing a severe allergic reaction, other serious injury, or death.  5. What if there is a serious problem? An allergic reaction could occur after the vaccinated person leaves the clinic. If you see signs of a severe allergic reaction (hives, swelling of the face and throat, difficulty breathing, a fast heartbeat, dizziness, or weakness), call 9-1-1 and get the person to the nearest hospital. For other signs that concern you, call your health care provider. Adverse reactions should be reported to the Vaccine Adverse Event Reporting System (VAERS). Your health care provider will usually file this report, or you can do it yourself. Visit the VAERS website at www.vaers.SamedayNews.es or call (743) 048-2582.VAERS is only for reporting reactions, and VAERS staff do not give medical advice. 6. The National Vaccine Injury Compensation Program The [employer] Vaccine Injury Compensation Program (VICP) is a federal program that was created to compensate people who may have been injured by certain vaccines. Visit the VICP website at GoldCloset.com.ee or call 248-580-4665 to learn about the program and about filing a claim. There is a time limit to file a claim for compensation. 7. How can I learn more?  Ask your healthcare provider.  Call your local or state health department.  Contact the Centers for Disease Control and Prevention (CDC): ? Call 351-305-9188 (1-800-CDC-INFO) or ? Visit CDC's https://gibson.com/ Vaccine Information Statement (Interim) Inactivated Influenza Vaccine (08/19/2017) This information is not intended to replace advice given to you by your health care provider. Make sure you discuss any questions you have with your health care provider. Document Released: 10/16/2005 Document Revised: 04/12/2018 Document Reviewed: 08/23/2017 Elsevier Patient Education  2020 [employer].

## 2019-04-18 NOTE — Progress Notes (Signed)
[facility] OFFICE PROGRESS NOTE   Diagnosis: Colon cancer  INTERVAL HISTORY:   Mr. Steib has decided to proceed with chemotherapy.  No new complaint.  Abdominal pain has improved.  He reports drinking adequate fluid.  No neuropathy symptoms.  No bleeding.  Objective:  Vital signs in last 24 hours:  Blood pressure 136/62, pulse (!) 115, temperature 98.7 F (37.1 C), temperature source Oral, resp. rate 20, height 5' 10"  (1.778 m), weight 143 lb 14.4 oz (65.3 kg), SpO2 100 %.     Resp: Lungs clear bilaterally Cardio: Regular rate and rhythm, tachycardia GI: Nontender, no mass, no hepatomegaly Vascular: No leg edema    Portacath/PICC-without erythema  Lab Results:  Lab Results  Component Value Date   WBC 20.2 (H) 04/18/2019   HGB 8.9 (L) 04/18/2019   HCT 28.1 (L) 04/18/2019   MCV 84.6 04/18/2019   PLT 420 (H) 04/18/2019   NEUTROABS 16.6 (H) 04/18/2019    CMP  Lab Results  Component Value Date   NA 131 (L) 03/30/2019   K 3.7 03/30/2019   CL 100 03/30/2019   CO2 20 (L) 03/30/2019   GLUCOSE 136 (H) 03/30/2019   BUN 10 03/30/2019   CREATININE 0.97 03/30/2019   CALCIUM 8.5 (L) 03/30/2019   PROT 7.1 03/30/2019   ALBUMIN 2.8 (L) 03/30/2019   AST 15 03/30/2019   ALT 10 03/30/2019   ALKPHOS 89 03/30/2019   BILITOT 0.4 03/30/2019   GFRNONAA >60 03/30/2019   GFRAA >60 03/30/2019    Lab Results  Component Value Date   CEA1 2.72 09/29/2018    Medications: I have reviewed the patient's current medications.   Assessment/Plan: 1. 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 Negative for a BRAF mutation  Tumor invaded through the muscularis propria into pericolonic fatty tissue and involved the attached omentum.   Cycle 1 adjuvant Xeloda 05/28/2013.   Cycle 2 adjuvant  Xeloda 06/18/2013.   Cycle 3 adjuvant Xeloda 07/09/2013.   Cycle 4 adjuvant Xeloda 07/30/2013.   Cycle 5 adjuvant Xeloda 08/20/2013.  Cycle 6 adjuvant Xeloda 09/11/2013.  cycle 7 adjuvant Xeloda 10/02/2013  Cycle 8 adjuvant Xeloda 10/23/2013  CTs of the chest, abdomen, and pelvis on 07/18/2014-negative for recurrent colon cancer   CTs abdomen/pelvis 09/06/2014 showed acute cholecystitis.  Colonoscopy 08/02/2015-chronic colitis, no malignancy  CTs 09/17/2015, new mass near the tail the pancreas, stable lung nodules  Biopsy mesenteric mass 10/04/2015 with metastatic adenocarcinoma consistent with colorectal primary  PET scan 10/10/2015 with soft tissue thickening within the peritoneal space of the left upper quadrant with SUV Max 7.9; no additional sites of peritoneal nodularity to suggest metastasis; hypermetabolic ill-defined nodule in the left lower lobe favored inflammatory.  Resection of the left abdominal mesenteric mass/tail the pancreas on 11/05/2015 with the pathology confirming metastatic colon cancer, positive "serosal" margin  CTs 07/29/2016-no evidence of metastatic disease, stable small lung nodules, decreased size of pancreatic resection bed fluid/mass  CTs 06/28/2017-decreased fluid and increased soft tissue at the area of the distal pancreatectomy with a few foci of gas  CT 09/29/2017-enlargement of left upper quadrant soft tissue mass between the stomach, spleen, and pancreas tail, stable nodules at the lung bases  CT biopsy of the upper quadrant mass 10/19/2017-metastatic colon cancer, MSS, tumor mutation burden 1, no KRAS, BRAF, or NRAS mutation  CTs 12/24/2017-complex left upper quadrant process involving the  stomach, spleen, tail the pancreas, and distal transverse colon.  CT 04/27/2018-stable to mild progression of the left upper quadrant mass, compression of the stomach with no obstruction, no other evidence of metastatic disease, resolution of small  fluid collection lateral to the left upper quadrant mass  CT 07/21/2018-enlargement of the left upper quadrant mass with involvement of the posterior stomach and spleen. Possible new right upper lung nodule  Cycle 1 FOLFOX 08/17/2018  Cycle 2 FOLFOX 08/31/2018, Udenyca added  Cycle 3 FOLFOX 09/14/2018, Udenyca  Cycle 4 FOLFOX 09/29/2018, oxaliplatin and 5-fluorouracil dose reduced 5-FU bolus held  CT abdomen/pelvis 10/12/2018-decrease in size of mass at the pancreas tail/posterior gastric wall, and splenic flexure of the colon, mass is more cystic  CT abdomen/pelvis 03/30/2019-significant enlargement of the left upper quadrant mass with invasion of the stomach, pancreas, spleen, and colon, small left pleural effusion  Cycle 5 FOLFOX 04/18/2019 2. Ulcerative colitis-extensive chronic active ulcerative colitis was noted on the colon resection specimen 03/28/2013.  Followed by Dr.Magod 3. Hypertension.  4. History of microcytic anemia-likely iron deficiency, unable to tolerate oral iron.  5. Possible area of cecal wall thickening noted on abdominal CT 03/20/2013. 6. Family history of colon cancer (maternal grandfather died in his 53s with colon cancer). 7. Bilateral calf and low anterior leg pain. Bilateral lower extremity venous duplex negative for DVT 07/14/2013. Right ABI with moderate, borderline severe arterial insufficiency; left ABI suggestive of moderate arterial insufficiency. He was referred to vascular. 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 TEE showed thrombus in the descending aorta. He underwent right popliteal to peroneal artery bypass graft, thrombectomy anterior tibialis and attempted thrombectomy tibioperoneal trunk and posterior tibialis on 08/11/2013. Right calf pain 09/26/2013 with findings of occlusion of right popliteal to peroneal bypass status post thrombolysis. Now on anticoagulation with Xarelto.  recurrent pain and ulceration at the right foot,  status post a popliteal to posterior tibial artery bypass using a cephalic vein graft on 97/67/3419  Xarelto discontinued due to high risk for bleeding and aspirin 81 mg daily initiated hospitalization July 2020 8. Status post laparoscopic cholecystectomy 09/08/2014 9. Anorexia following the mesenteric mass/pancreas tail resection-improved 10. Abdominal abscess November 2017-pancreas "cysts "drainage catheter placed 11/26/2015; CT 02/04/2016 with no change to slight decreasein size of residual pancreatic tail fluid collection.Drainage catheter removed approximately early March 2018  CT 02/20/2016-new small left pleural effusion, increased size of the surgical bed fluid collection, indeterminate pulmonary nodules  CT 07/29/2016-decreased fluid/soft tissue density near the pancreas is no evidence of metastatic disease, stable subcentimeter lung nodules 11.Admission 12/24/2017 with an abdominal abscess, status post placement of a percutaneous drain and antibiotic therapy; CTs abdomen/pelvis 01/04/2018-near complete drainage of previously noted complex fluid collection in the left upper quadrant. 2 small residual collections remain. Residual soft tissue mass at the clips concerning for residual recurrent neoplasm. Peritoneal wall thickening also concerning for metastatic disease. Drain removed 01/04/2018  CT 01/18/2018-no change in soft tissue mass at the tail of pancreas with involvement of the gastric fundus and spleen, there is central low attenuation  Admission 01/20/2018 with fever and failure to thrive, no drainable fluid collection, placed on IV antibiotics, discharged to complete an outpatient course of IV ceftriaxone and Flagyl completed 02/20/2018  12.Admission 7/26/2020transient speech abnormality and microcytic anemia  13.Port-A-Cath placement, Dr. Hassell Done, 08/15/2018     Disposition: Mr. Sabo appears stable.  He has decided to begin a trial of salvage chemotherapy.  He  will complete a cycle of FOLFOX today.  We  discussed potential toxicities associated with FOLFOX including the chance of an allergic reaction and neuropathy.  He agrees to proceed.  He will receive G-CSF support.  Mr. Sequeira will return for an office visit and chemotherapy in 2 weeks.  He will contact us in the interim as needed.  Betsy Coder, MD  04/18/2019  9:39 AM

## 2019-04-19 ENCOUNTER — Telehealth: Payer: Self-pay | Admitting: Oncology

## 2019-04-19 NOTE — Telephone Encounter (Signed)
Scheduled per los. Called and spoke with patient. Confirmed appt 

## 2019-04-20 ENCOUNTER — Inpatient Hospital Stay: Payer: Medicare Other

## 2019-04-20 ENCOUNTER — Other Ambulatory Visit: Payer: Self-pay

## 2019-04-20 VITALS — BP 110/52 | HR 98 | Temp 98.9°F | Resp 18

## 2019-04-20 DIAGNOSIS — R5381 Other malaise: Secondary | ICD-10-CM | POA: Diagnosis not present

## 2019-04-20 DIAGNOSIS — R110 Nausea: Secondary | ICD-10-CM | POA: Diagnosis not present

## 2019-04-20 DIAGNOSIS — R109 Unspecified abdominal pain: Secondary | ICD-10-CM | POA: Diagnosis not present

## 2019-04-20 DIAGNOSIS — Z5111 Encounter for antineoplastic chemotherapy: Secondary | ICD-10-CM | POA: Diagnosis not present

## 2019-04-20 DIAGNOSIS — C189 Malignant neoplasm of colon, unspecified: Secondary | ICD-10-CM

## 2019-04-20 DIAGNOSIS — C7889 Secondary malignant neoplasm of other digestive organs: Secondary | ICD-10-CM | POA: Diagnosis not present

## 2019-04-20 DIAGNOSIS — C184 Malignant neoplasm of transverse colon: Secondary | ICD-10-CM | POA: Diagnosis not present

## 2019-04-20 MED ORDER — SODIUM CHLORIDE 0.9% FLUSH
10.0000 mL | INTRAVENOUS | Status: DC | PRN
  Administered 2019-04-20: 10 mL
  Filled 2019-04-20: qty 10

## 2019-04-20 MED ORDER — PEGFILGRASTIM-CBQV 6 MG/0.6ML ~~LOC~~ SOSY
6.0000 mg | PREFILLED_SYRINGE | Freq: Once | SUBCUTANEOUS | Status: AC
  Administered 2019-04-20: 13:00:00 6 mg via SUBCUTANEOUS

## 2019-04-20 MED ORDER — HEPARIN SOD (PORK) LOCK FLUSH 100 UNIT/ML IV SOLN
500.0000 [IU] | Freq: Once | INTRAVENOUS | Status: AC | PRN
  Administered 2019-04-20: 500 [IU]
  Filled 2019-04-20: qty 5

## 2019-04-20 MED ORDER — PEGFILGRASTIM-CBQV 6 MG/0.6ML ~~LOC~~ SOSY
PREFILLED_SYRINGE | SUBCUTANEOUS | Status: AC
  Filled 2019-04-20: qty 0.6

## 2019-04-20 NOTE — Patient Instructions (Signed)

## 2019-04-21 ENCOUNTER — Telehealth: Payer: Self-pay | Admitting: *Deleted

## 2019-04-21 NOTE — Telephone Encounter (Signed)
Reports BP yesterday was low at 110/52 and still feels weak. He is not having nausea and able to drink liquids. Not feeling dizzy. He did decide to hold his BP med today. Has machine to check it at home. Suggested he continue to push fluids and hold his BP med till BP is back at his baseline. He agrees to call if he feels worse. He declined to come in Saturday for IV fluids.

## 2019-04-24 ENCOUNTER — Telehealth: Payer: Self-pay | Admitting: *Deleted

## 2019-04-24 NOTE — Telephone Encounter (Signed)
Calling to report he does not think he will agree to any further chemotherapy. States the last treatment has been very hard on him with severe fatigue and not wanting to eat. He reports his BP is back up to 124/60 and he is not dizzy. Eating small amounts, but feels his fluid intake is adequate. No nausea at this time.Main complaint is extreme fatigue and continued pain in is arms, back and neck. Having some sinus drainage and asking what to try--has OTC Mucinex at home. Suggested he try this or OTC Claritin D or similar OTC med.

## 2019-04-27 ENCOUNTER — Other Ambulatory Visit: Payer: Self-pay | Admitting: Oncology

## 2019-04-27 NOTE — Progress Notes (Signed)
Pharmacist Chemotherapy Monitoring - Follow Up Assessment    I verify that I have reviewed each item in the below checklist:  . Regimen for the patient is scheduled for the appropriate day and plan matches scheduled date. Marland Kitchen Appropriate non-routine labs are ordered dependent on drug ordered. . If applicable, additional medications reviewed and ordered per protocol based on lifetime cumulative doses and/or treatment regimen.   Plan for follow-up and/or issues identified: No . I-vent associated with next due treatment: No . MD and/or nursing notified: No  Cody Suarez D 04/27/2019 3:39 PM

## 2019-05-02 MED FILL — Dexamethasone Sodium Phosphate Inj 100 MG/10ML: INTRAMUSCULAR | Qty: 1 | Status: AC

## 2019-05-03 ENCOUNTER — Inpatient Hospital Stay: Payer: Medicare Other

## 2019-05-03 ENCOUNTER — Inpatient Hospital Stay (HOSPITAL_BASED_OUTPATIENT_CLINIC_OR_DEPARTMENT_OTHER): Payer: Medicare Other | Admitting: Oncology

## 2019-05-03 ENCOUNTER — Telehealth: Payer: Self-pay | Admitting: *Deleted

## 2019-05-03 ENCOUNTER — Other Ambulatory Visit: Payer: Self-pay

## 2019-05-03 VITALS — BP 123/58 | HR 107 | Temp 98.7°F | Resp 17 | Ht 70.0 in | Wt 143.1 lb

## 2019-05-03 DIAGNOSIS — R531 Weakness: Secondary | ICD-10-CM | POA: Diagnosis not present

## 2019-05-03 DIAGNOSIS — R5381 Other malaise: Secondary | ICD-10-CM | POA: Diagnosis not present

## 2019-05-03 DIAGNOSIS — I10 Essential (primary) hypertension: Secondary | ICD-10-CM | POA: Diagnosis not present

## 2019-05-03 DIAGNOSIS — Z95828 Presence of other vascular implants and grafts: Secondary | ICD-10-CM

## 2019-05-03 DIAGNOSIS — Z5111 Encounter for antineoplastic chemotherapy: Secondary | ICD-10-CM | POA: Diagnosis not present

## 2019-05-03 DIAGNOSIS — R109 Unspecified abdominal pain: Secondary | ICD-10-CM | POA: Diagnosis not present

## 2019-05-03 DIAGNOSIS — C189 Malignant neoplasm of colon, unspecified: Secondary | ICD-10-CM

## 2019-05-03 DIAGNOSIS — C184 Malignant neoplasm of transverse colon: Secondary | ICD-10-CM | POA: Diagnosis not present

## 2019-05-03 DIAGNOSIS — C7889 Secondary malignant neoplasm of other digestive organs: Secondary | ICD-10-CM | POA: Diagnosis not present

## 2019-05-03 DIAGNOSIS — C799 Secondary malignant neoplasm of unspecified site: Secondary | ICD-10-CM

## 2019-05-03 DIAGNOSIS — R630 Anorexia: Secondary | ICD-10-CM | POA: Diagnosis not present

## 2019-05-03 DIAGNOSIS — R110 Nausea: Secondary | ICD-10-CM | POA: Diagnosis not present

## 2019-05-03 LAB — CBC WITH DIFFERENTIAL (CANCER CENTER ONLY)
Abs Immature Granulocytes: 0.57 10*3/uL — ABNORMAL HIGH (ref 0.00–0.07)
Basophils Absolute: 0.2 10*3/uL — ABNORMAL HIGH (ref 0.0–0.1)
Basophils Relative: 1 %
Eosinophils Absolute: 0.1 10*3/uL (ref 0.0–0.5)
Eosinophils Relative: 0 %
HCT: 26.9 % — ABNORMAL LOW (ref 39.0–52.0)
Hemoglobin: 8.4 g/dL — ABNORMAL LOW (ref 13.0–17.0)
Immature Granulocytes: 2 %
Lymphocytes Relative: 3 %
Lymphs Abs: 0.9 10*3/uL (ref 0.7–4.0)
MCH: 28.3 pg (ref 26.0–34.0)
MCHC: 31.2 g/dL (ref 30.0–36.0)
MCV: 90.6 fL (ref 80.0–100.0)
Monocytes Absolute: 1.9 10*3/uL — ABNORMAL HIGH (ref 0.1–1.0)
Monocytes Relative: 7 %
Neutro Abs: 24.2 10*3/uL — ABNORMAL HIGH (ref 1.7–7.7)
Neutrophils Relative %: 87 %
Platelet Count: 411 10*3/uL — ABNORMAL HIGH (ref 150–400)
RBC: 2.97 MIL/uL — ABNORMAL LOW (ref 4.22–5.81)
RDW: 25.3 % — ABNORMAL HIGH (ref 11.5–15.5)
WBC Count: 27.7 10*3/uL — ABNORMAL HIGH (ref 4.0–10.5)
nRBC: 0.1 % (ref 0.0–0.2)

## 2019-05-03 LAB — CMP (CANCER CENTER ONLY)
ALT: 6 U/L (ref 0–44)
AST: 11 U/L — ABNORMAL LOW (ref 15–41)
Albumin: 2.8 g/dL — ABNORMAL LOW (ref 3.5–5.0)
Alkaline Phosphatase: 102 U/L (ref 38–126)
Anion gap: 12 (ref 5–15)
BUN: 11 mg/dL (ref 8–23)
CO2: 25 mmol/L (ref 22–32)
Calcium: 8.5 mg/dL — ABNORMAL LOW (ref 8.9–10.3)
Chloride: 101 mmol/L (ref 98–111)
Creatinine: 0.84 mg/dL (ref 0.61–1.24)
GFR, Est AFR Am: >60 mL/min (ref >60)
GFR, Estimated: >60 mL/min (ref >60)
Glucose, Bld: 152 mg/dL — ABNORMAL HIGH (ref 70–99)
Potassium: 3.8 mmol/L (ref 3.5–5.1)
Sodium: 138 mmol/L (ref 135–145)
Total Bilirubin: 0.2 mg/dL — ABNORMAL LOW (ref 0.3–1.2)
Total Protein: 6.6 g/dL (ref 6.5–8.1)

## 2019-05-03 MED ORDER — SODIUM CHLORIDE 0.9% FLUSH
10.0000 mL | INTRAVENOUS | Status: DC | PRN
  Administered 2019-05-03: 11:00:00 10 mL via INTRAVENOUS
  Filled 2019-05-03: qty 10

## 2019-05-03 MED ORDER — HEPARIN SOD (PORK) LOCK FLUSH 100 UNIT/ML IV SOLN
500.0000 [IU] | Freq: Once | INTRAVENOUS | Status: AC
  Administered 2019-05-03: 11:00:00 500 [IU]
  Filled 2019-05-03: qty 5

## 2019-05-03 NOTE — Progress Notes (Signed)
[facility] OFFICE PROGRESS NOTE   Diagnosis: Colon cancer  INTERVAL HISTORY:   Cody Suarez completed a cycle of FOLFOX on 04/18/2019.  He received G-CSF support.  He reports developing arm and upper back pain on the day of the pump disconnect.  He had nausea following chemotherapy, but no emesis.  No bleeding.  He has mild abdominal discomfort.  He had ulcers at the tip of the tongue.  These have resolved.  Cody Suarez has decided against further chemotherapy.  Objective:  Vital signs in last 24 hours:  Blood pressure (!) 123/58, pulse (!) 107, temperature 98.7 F (37.1 C), temperature source Temporal, resp. rate 17, height 5' 10"  (1.778 m), weight 143 lb 1.6 oz (64.9 kg), SpO2 99 %.    HEENT: No thrush or ulcers Resp: Lungs clear bilaterally Cardio: Regular rate and rhythm GI: No mass, no hepatosplenomegaly, nontender Vascular: No leg edema    Portacath/PICC-without erythema  Lab Results:  Lab Results  Component Value Date   WBC 27.7 (H) 05/03/2019   HGB 8.4 (L) 05/03/2019   HCT 26.9 (L) 05/03/2019   MCV 90.6 05/03/2019   PLT 411 (H) 05/03/2019   NEUTROABS 24.2 (H) 05/03/2019    CMP  Lab Results  Component Value Date   NA 138 05/03/2019   K 3.8 05/03/2019   CL 101 05/03/2019   CO2 25 05/03/2019   GLUCOSE 152 (H) 05/03/2019   BUN 11 05/03/2019   CREATININE 0.84 05/03/2019   CALCIUM 8.5 (L) 05/03/2019   PROT 6.6 05/03/2019   ALBUMIN 2.8 (L) 05/03/2019   AST 11 (L) 05/03/2019   ALT 6 05/03/2019   ALKPHOS 102 05/03/2019   BILITOT 0.2 (L) 05/03/2019   GFRNONAA >60 05/03/2019   GFRAA >60 05/03/2019    Lab Results  Component Value Date   CEA1 3.56 04/18/2019    Medications: I have reviewed the patient's current medications.   Assessment/Plan: 1. 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 Negative for a BRAF mutation  Tumor invaded through the muscularis propria into pericolonic fatty tissue and involved the attached omentum.   Cycle 1 adjuvant Xeloda 05/28/2013.   Cycle 2 adjuvant Xeloda 06/18/2013.   Cycle 3 adjuvant Xeloda 07/09/2013.   Cycle 4 adjuvant Xeloda 07/30/2013.   Cycle 5 adjuvant Xeloda 08/20/2013.  Cycle 6 adjuvant Xeloda 09/11/2013.  cycle 7 adjuvant Xeloda 10/02/2013  Cycle 8 adjuvant Xeloda 10/23/2013  CTs of the chest, abdomen, and pelvis on 07/18/2014-negative for recurrent colon cancer   CTs abdomen/pelvis 09/06/2014 showed acute cholecystitis.  Colonoscopy 08/02/2015-chronic colitis, no malignancy  CTs 09/17/2015, new mass near the tail the pancreas, stable lung nodules  Biopsy mesenteric mass 10/04/2015 with metastatic adenocarcinoma consistent with colorectal primary  PET scan 10/10/2015 with soft tissue thickening within the peritoneal space of the left upper quadrant with SUV Max 7.9; no additional sites of peritoneal nodularity to suggest metastasis; hypermetabolic ill-defined nodule in the left lower lobe favored inflammatory.  Resection of the left abdominal mesenteric mass/tail the pancreas on 11/05/2015 with the pathology confirming metastatic colon cancer, positive "serosal" margin  CTs 07/29/2016-no evidence of metastatic disease, stable small lung nodules, decreased size of pancreatic resection bed fluid/mass  CTs 06/28/2017-decreased fluid and increased soft tissue at the area of the distal pancreatectomy with a few foci of gas  CT 09/29/2017-enlargement of left upper quadrant soft tissue mass  between the stomach, spleen, and pancreas tail, stable nodules at the lung bases  CT biopsy of the upper quadrant mass 10/19/2017-metastatic colon cancer, MSS, tumor mutation burden 1, no KRAS, BRAF, or NRAS mutation  CTs 12/24/2017-complex left upper quadrant process involving the stomach, spleen,  tail the pancreas, and distal transverse colon.  CT 04/27/2018-stable to mild progression of the left upper quadrant mass, compression of the stomach with no obstruction, no other evidence of metastatic disease, resolution of small fluid collection lateral to the left upper quadrant mass  CT 07/21/2018-enlargement of the left upper quadrant mass with involvement of the posterior stomach and spleen. Possible new right upper lung nodule  Cycle 1 FOLFOX 08/17/2018  Cycle 2 FOLFOX 08/31/2018, Udenyca added  Cycle 3 FOLFOX 09/14/2018, Udenyca  Cycle 4 FOLFOX 09/29/2018, oxaliplatin and 5-fluorouracil dose reduced 5-FU bolus held  CT abdomen/pelvis 10/12/2018-decrease in size of mass at the pancreas tail/posterior gastric wall, and splenic flexure of the colon, mass is more cystic  CT abdomen/pelvis 03/30/2019-significant enlargement of the left upper quadrant mass with invasion of the stomach, pancreas, spleen, and colon, small left pleural effusion  Cycle 5 FOLFOX 04/18/2019 2. Ulcerative colitis-extensive chronic active ulcerative colitis was noted on the colon resection specimen 03/28/2013.  Followed by Dr.Magod 3. Hypertension.  4. History of microcytic anemia-likely iron deficiency, unable to tolerate oral iron.  5. Possible area of cecal wall thickening noted on abdominal CT 03/20/2013. 6. Family history of colon cancer (maternal grandfather died in his 36s with colon cancer). 7. Bilateral calf and low anterior leg pain. Bilateral lower extremity venous duplex negative for DVT 07/14/2013. Right ABI with moderate, borderline severe arterial insufficiency; left ABI suggestive of moderate arterial insufficiency. He was referred to vascular. 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 TEE showed thrombus in the descending aorta. He underwent right popliteal to peroneal artery bypass graft, thrombectomy anterior tibialis and attempted thrombectomy tibioperoneal trunk and  posterior tibialis on 08/11/2013. Right calf pain 09/26/2013 with findings of occlusion of right popliteal to peroneal bypass status post thrombolysis. Now on anticoagulation with Xarelto.  recurrent pain and ulceration at the right foot, status post a popliteal to posterior tibial artery bypass using a cephalic vein graft on 40/10/2723  Xarelto discontinued due to high risk for bleeding and aspirin 81 mg daily initiated hospitalization July 2020 8. Status post laparoscopic cholecystectomy 09/08/2014 9. Anorexia following the mesenteric mass/pancreas tail resection-improved 10. Abdominal abscess November 2017-pancreas "cysts "drainage catheter placed 11/26/2015; CT 02/04/2016 with no change to slight decreasein size of residual pancreatic tail fluid collection.Drainage catheter removed approximately early March 2018  CT 02/20/2016-new small left pleural effusion, increased size of the surgical bed fluid collection, indeterminate pulmonary nodules  CT 07/29/2016-decreased fluid/soft tissue density near the pancreas is no evidence of metastatic disease, stable subcentimeter lung nodules 11.Admission 12/24/2017 with an abdominal abscess, status post placement of a percutaneous drain and antibiotic therapy; CTs abdomen/pelvis 01/04/2018-near complete drainage of previously noted complex fluid collection in the left upper quadrant. 2 small residual collections remain. Residual soft tissue mass at the clips concerning for residual recurrent neoplasm. Peritoneal wall thickening also concerning for metastatic disease. Drain removed 01/04/2018  CT 01/18/2018-no change in soft tissue mass at the tail of pancreas with involvement of the gastric fundus and spleen, there is central low attenuation  Admission 01/20/2018 with fever and failure to thrive, no drainable fluid collection, placed on IV antibiotics, discharged to complete an outpatient course of IV ceftriaxone and Flagyl completed  02/20/2018  12.Admission 7/26/2020transient speech abnormality and microcytic anemia  13.Port-A-Cath placement, Dr. Hassell Done, 08/15/2018 14.  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  He completed 1 cycle of salvage chemotherapy with FOLFOX.  The chemotherapy was complicated by bone pain and nausea.  The bone pain was likely secondary to Neulasta.  The white count is elevated today.  He understands that we can discontinue Neulasta from the chemotherapy regimen and the bone pain should not recur.  Mr. Choyce has decided against further chemotherapy.  He agrees to home hospice care.  We will make a referral to the [facility] program.  Mr. Cu agrees to a no CODE BLUE status.  He will return for an office visit in 2 weeks.  We will provide transfusion support if he develops symptomatic anemia.  Betsy Coder, MD  05/03/2019  11:46 AM

## 2019-05-03 NOTE — Telephone Encounter (Signed)
Placed hospice referral and provided Cassandra diagnosis and demographic information. They will attempt to see him tomorrow. Confirmed he is DNR and has the out of facility DNR sheet at home. Dr. Benay Spice will be his attending for hospice.

## 2019-05-05 ENCOUNTER — Telehealth: Payer: Self-pay | Admitting: Oncology

## 2019-05-05 DIAGNOSIS — R531 Weakness: Secondary | ICD-10-CM | POA: Diagnosis not present

## 2019-05-05 DIAGNOSIS — C189 Malignant neoplasm of colon, unspecified: Secondary | ICD-10-CM | POA: Diagnosis not present

## 2019-05-05 DIAGNOSIS — R630 Anorexia: Secondary | ICD-10-CM | POA: Diagnosis not present

## 2019-05-05 DIAGNOSIS — I10 Essential (primary) hypertension: Secondary | ICD-10-CM | POA: Diagnosis not present

## 2019-05-05 NOTE — Telephone Encounter (Signed)
Scheduled per los called and left msg. Mailed printout

## 2019-05-06 DIAGNOSIS — C189 Malignant neoplasm of colon, unspecified: Secondary | ICD-10-CM | POA: Diagnosis not present

## 2019-05-06 DIAGNOSIS — R630 Anorexia: Secondary | ICD-10-CM | POA: Diagnosis not present

## 2019-05-06 DIAGNOSIS — R531 Weakness: Secondary | ICD-10-CM | POA: Diagnosis not present

## 2019-05-06 DIAGNOSIS — I10 Essential (primary) hypertension: Secondary | ICD-10-CM | POA: Diagnosis not present

## 2019-05-10 ENCOUNTER — Telehealth: Payer: Self-pay | Admitting: *Deleted

## 2019-05-10 ENCOUNTER — Other Ambulatory Visit: Payer: Self-pay | Admitting: Nurse Practitioner

## 2019-05-10 DIAGNOSIS — C7889 Secondary malignant neoplasm of other digestive organs: Secondary | ICD-10-CM

## 2019-05-10 DIAGNOSIS — I10 Essential (primary) hypertension: Secondary | ICD-10-CM | POA: Diagnosis not present

## 2019-05-10 DIAGNOSIS — R531 Weakness: Secondary | ICD-10-CM | POA: Diagnosis not present

## 2019-05-10 DIAGNOSIS — C189 Malignant neoplasm of colon, unspecified: Secondary | ICD-10-CM | POA: Diagnosis not present

## 2019-05-10 DIAGNOSIS — R630 Anorexia: Secondary | ICD-10-CM | POA: Diagnosis not present

## 2019-05-10 MED ORDER — DOXYCYCLINE HYCLATE 100 MG PO TABS: 100.0000 mg | ORAL_TABLET | Freq: Two times a day (BID) | ORAL | 0 refills | Status: AC

## 2019-05-10 NOTE — Telephone Encounter (Addendum)
Patient reporting pain at left flank area. RN reports it appears to be site of a prior drain--is red, tender and swollen. Does MD want to order an antibiotic? Per Ned Card, NP: Doxycycline 100 mg bid X 7 days. Script sent to [facility]. Patient notified and says hospice RN called in some augmentin for him. Instructed him not to pick up the Augmentin, but take the doxycycline that was sent in. Unable to reach his nurse, so call hospice office and left message of the antibiotic we prefer him to take.

## 2019-05-17 DIAGNOSIS — I10 Essential (primary) hypertension: Secondary | ICD-10-CM | POA: Diagnosis not present

## 2019-05-17 DIAGNOSIS — C189 Malignant neoplasm of colon, unspecified: Secondary | ICD-10-CM | POA: Diagnosis not present

## 2019-05-17 DIAGNOSIS — R531 Weakness: Secondary | ICD-10-CM | POA: Diagnosis not present

## 2019-05-17 DIAGNOSIS — R630 Anorexia: Secondary | ICD-10-CM | POA: Diagnosis not present

## 2019-05-18 ENCOUNTER — Inpatient Hospital Stay

## 2019-05-18 ENCOUNTER — Encounter: Payer: Self-pay | Admitting: Nurse Practitioner

## 2019-05-18 ENCOUNTER — Inpatient Hospital Stay: Attending: Oncology | Admitting: Nurse Practitioner

## 2019-05-18 ENCOUNTER — Telehealth: Payer: Self-pay | Admitting: Nurse Practitioner

## 2019-05-18 ENCOUNTER — Other Ambulatory Visit: Payer: Self-pay

## 2019-05-18 VITALS — BP 130/62 | HR 93 | Temp 97.1°F | Resp 16 | Ht 70.0 in | Wt 141.8 lb

## 2019-05-18 DIAGNOSIS — D649 Anemia, unspecified: Secondary | ICD-10-CM | POA: Insufficient documentation

## 2019-05-18 DIAGNOSIS — C799 Secondary malignant neoplasm of unspecified site: Secondary | ICD-10-CM

## 2019-05-18 DIAGNOSIS — C189 Malignant neoplasm of colon, unspecified: Secondary | ICD-10-CM | POA: Diagnosis not present

## 2019-05-18 DIAGNOSIS — C184 Malignant neoplasm of transverse colon: Secondary | ICD-10-CM | POA: Diagnosis not present

## 2019-05-18 DIAGNOSIS — Z95828 Presence of other vascular implants and grafts: Secondary | ICD-10-CM

## 2019-05-18 LAB — CBC WITH DIFFERENTIAL (CANCER CENTER ONLY)
Abs Immature Granulocytes: 0.17 10*3/uL — ABNORMAL HIGH (ref 0.00–0.07)
Basophils Absolute: 0.2 10*3/uL — ABNORMAL HIGH (ref 0.0–0.1)
Basophils Relative: 1 %
Eosinophils Absolute: 0 10*3/uL (ref 0.0–0.5)
Eosinophils Relative: 0 %
HCT: 25 % — ABNORMAL LOW (ref 39.0–52.0)
Hemoglobin: 7.6 g/dL — ABNORMAL LOW (ref 13.0–17.0)
Immature Granulocytes: 1 %
Lymphocytes Relative: 3 %
Lymphs Abs: 0.5 10*3/uL — ABNORMAL LOW (ref 0.7–4.0)
MCH: 28 pg (ref 26.0–34.0)
MCHC: 30.4 g/dL (ref 30.0–36.0)
MCV: 92.3 fL (ref 80.0–100.0)
Monocytes Absolute: 0.8 10*3/uL (ref 0.1–1.0)
Monocytes Relative: 4 %
Neutro Abs: 19.4 10*3/uL — ABNORMAL HIGH (ref 1.7–7.7)
Neutrophils Relative %: 91 %
Platelet Count: 419 10*3/uL — ABNORMAL HIGH (ref 150–400)
RBC: 2.71 MIL/uL — ABNORMAL LOW (ref 4.22–5.81)
RDW: 22.5 % — ABNORMAL HIGH (ref 11.5–15.5)
WBC Count: 21 10*3/uL — ABNORMAL HIGH (ref 4.0–10.5)
nRBC: 0 % (ref 0.0–0.2)

## 2019-05-18 LAB — SAMPLE TO BLOOD BANK

## 2019-05-18 MED ORDER — SODIUM CHLORIDE 0.9% FLUSH
10.0000 mL | Freq: Once | INTRAVENOUS | Status: AC
  Administered 2019-05-18: 10 mL
  Filled 2019-05-18: qty 10

## 2019-05-18 NOTE — Telephone Encounter (Signed)
Scheduled per 5/13 los. Printed AVS and calender for pt.

## 2019-05-18 NOTE — Progress Notes (Addendum)
[facility] OFFICE PROGRESS NOTE   Diagnosis: Colon cancer  INTERVAL HISTORY:   [name] returns as scheduled.  He is enrolled in the hospice program.  Currently visits are happening every 2 weeks.  He states that he feels "all right".  He denies pain.  No bleeding.  An old drain site at the left abdomen became red and irritated last week.  We prescribed a course of doxycycline.  He continues to note drainage but the site appears improved.  He denies fever.  Objective:  Vital signs in last 24 hours:  Blood pressure 130/62, pulse 93, temperature (!) 97.1 F (36.2 C), temperature source Temporal, resp. rate 16, height 5' 10"  (1.778 m), weight 141 lb 12.8 oz (64.3 kg), SpO2 100 %.    GI: Abdomen soft and nontender.  No hepatomegaly.  No mass. Vascular: No leg edema. Neuro: Alert and oriented. Skin: Scar left lateral abdomen with a small opening without erythema, small amount serous appearing drainage, nontender, no fluctuance.   Lab Results:  Lab Results  Component Value Date   WBC 21.0 (H) 05/18/2019   HGB 7.6 (L) 05/18/2019   HCT 25.0 (L) 05/18/2019   MCV 92.3 05/18/2019   PLT 419 (H) 05/18/2019   NEUTROABS 19.4 (H) 05/18/2019    Imaging:  No results found.  Medications: I have reviewed the patient's current medications.  Assessment/Plan: 1. 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 Negative for a BRAF mutation  Tumor invaded through the muscularis propria into pericolonic fatty tissue and involved the attached omentum.   Cycle 1 adjuvant Xeloda 05/28/2013.   Cycle 2 adjuvant Xeloda 06/18/2013.   Cycle 3 adjuvant Xeloda 07/09/2013.   Cycle 4 adjuvant Xeloda 07/30/2013.   Cycle 5 adjuvant Xeloda 08/20/2013.  Cycle 6 adjuvant Xeloda 09/11/2013.  cycle 7  adjuvant Xeloda 10/02/2013  Cycle 8 adjuvant Xeloda 10/23/2013  CTs of the chest, abdomen, and pelvis on 07/18/2014-negative for recurrent colon cancer   CTs abdomen/pelvis 09/06/2014 showed acute cholecystitis.  Colonoscopy 08/02/2015-chronic colitis, no malignancy  CTs 09/17/2015, new mass near the tail the pancreas, stable lung nodules  Biopsy mesenteric mass 10/04/2015 with metastatic adenocarcinoma consistent with colorectal primary  PET scan 10/10/2015 with soft tissue thickening within the peritoneal space of the left upper quadrant with SUV Max 7.9; no additional sites of peritoneal nodularity to suggest metastasis; hypermetabolic ill-defined nodule in the left lower lobe favored inflammatory.  Resection of the left abdominal mesenteric mass/tail the pancreas on 11/05/2015 with the pathology confirming metastatic colon cancer, positive "serosal" margin  CTs 07/29/2016-no evidence of metastatic disease, stable small lung nodules, decreased size of pancreatic resection bed fluid/mass  CTs 06/28/2017-decreased fluid and increased soft tissue at the area of the distal pancreatectomy with a few foci of gas  CT 09/29/2017-enlargement of left upper quadrant soft tissue mass between the stomach, spleen, and pancreas tail, stable nodules at the lung bases  CT biopsy of the upper quadrant mass 10/19/2017-metastatic colon cancer, MSS, tumor mutation burden 1, no KRAS, BRAF, or NRAS mutation  CTs 12/24/2017-complex left upper quadrant process involving the stomach, spleen, tail the pancreas, and distal transverse colon.  CT 04/27/2018-stable to mild progression of the left upper quadrant mass, compression of the stomach with no obstruction, no other evidence of metastatic disease, resolution of small fluid collection lateral to the left upper quadrant mass  CT 07/21/2018-enlargement  of the left upper quadrant mass with involvement of the posterior stomach and spleen. Possible new right  upper lung nodule  Cycle 1 FOLFOX 08/17/2018  Cycle 2 FOLFOX 08/31/2018, Udenyca added  Cycle 3 FOLFOX 09/14/2018, Udenyca  Cycle 4 FOLFOX 09/29/2018, oxaliplatin and 5-fluorouracil dose reduced 5-FU bolus held  CT abdomen/pelvis 10/12/2018-decrease in size of mass at the pancreas tail/posterior gastric wall, and splenic flexure of the colon, mass is more cystic  CT abdomen/pelvis 03/30/2019-significant enlargement of the left upper quadrant mass with invasion of the stomach, pancreas, spleen, andcolon, small left pleural effusion  Cycle 5 FOLFOX 04/18/2019 2. Ulcerative colitis-extensive chronic active ulcerative colitis was noted on the colon resection specimen 03/28/2013.  Followed by Dr.Magod 3. Hypertension.  4. History of microcytic anemia-likely iron deficiency, unable to tolerate oral iron.  5. Possible area of cecal wall thickening noted on abdominal CT 03/20/2013. 6. Family history of colon cancer (maternal grandfather died in his 57s with colon cancer). 7. Bilateral calf and low anterior leg pain. Bilateral lower extremity venous duplex negative for DVT 07/14/2013. Right ABI with moderate, borderline severe arterial insufficiency; left ABI suggestive of moderate arterial insufficiency. He was referred to vascular. 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 TEE showed thrombus in the descending aorta. He underwent right popliteal to peroneal artery bypass graft, thrombectomy anterior tibialis and attempted thrombectomy tibioperoneal trunk and posterior tibialis on 08/11/2013. Right calf pain 09/26/2013 with findings of occlusion of right popliteal to peroneal bypass status post thrombolysis. Now on anticoagulation with Xarelto.  recurrent pain and ulceration at the right foot, status post a popliteal to posterior tibial artery bypass using a cephalic vein graft on 09/31/1216  Xarelto discontinued due to high risk for bleeding and aspirin 81 mg daily initiated  hospitalization July 2020 8. Status post laparoscopic cholecystectomy 09/08/2014 9. Anorexia following the mesenteric mass/pancreas tail resection-improved 10. Abdominal abscess November 2017-pancreas "cysts "drainage catheter placed 11/26/2015; CT 02/04/2016 with no change to slight decreasein size of residual pancreatic tail fluid collection.Drainage catheter removed approximately early March 2018  CT 02/20/2016-new small left pleural effusion, increased size of the surgical bed fluid collection, indeterminate pulmonary nodules  CT 07/29/2016-decreased fluid/soft tissue density near the pancreas is no evidence of metastatic disease, stable subcentimeter lung nodules 11.Admission 12/24/2017 with an abdominal abscess, status post placement of a percutaneous drain and antibiotic therapy; CTs abdomen/pelvis 01/04/2018-near complete drainage of previously noted complex fluid collection in the left upper quadrant. 2 small residual collections remain. Residual soft tissue mass at the clips concerning for residual recurrent neoplasm. Peritoneal wall thickening also concerning for metastatic disease. Drain removed 01/04/2018  CT 01/18/2018-no change in soft tissue mass at the tail of pancreas with involvement of the gastric fundus and spleen, there is central low attenuation  Admission 01/20/2018 with fever and failure to thrive, no drainable fluid collection, placed on IV antibiotics, discharged to complete an outpatient course of IV ceftriaxone and Flagyl completed 02/20/2018  12.Admission 7/26/2020transient speech abnormality and microcytic anemia  13.Port-A-Cath placement, Dr. Hassell Done, 08/15/2018 14.  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  He is enrolled in the hospice program.  Plan to continue a supportive care approach.  We reviewed the CBC from today.  He has progressive symptomatic  anemia.  We are arranging for a blood transfusion on 05/22/2019.  He will return for lab, Port-A-Cath flush and follow-up in 6 weeks.  Patient seen with Dr.  Sherrill.    Ned Card ANP/GNP-BC   05/18/2019  1:41 PM This was a shared visit with Ned Card.  [facility] Suarez was interviewed and examined.  The wound at the previous left abdomen drain site has opened.  He may be developing a fistula.  He will call for a fever or increased erythema.  He will be scheduled for a red cell transfusion for treatment of symptomatic anemia.  He will call for recurrent symptoms of anemia.  Julieanne Manson, MD

## 2019-05-22 ENCOUNTER — Inpatient Hospital Stay

## 2019-05-22 ENCOUNTER — Other Ambulatory Visit: Payer: Self-pay

## 2019-05-22 DIAGNOSIS — C189 Malignant neoplasm of colon, unspecified: Secondary | ICD-10-CM

## 2019-05-22 DIAGNOSIS — C184 Malignant neoplasm of transverse colon: Secondary | ICD-10-CM | POA: Diagnosis not present

## 2019-05-22 LAB — PREPARE RBC (CROSSMATCH)

## 2019-05-22 MED ORDER — HEPARIN SOD (PORK) LOCK FLUSH 100 UNIT/ML IV SOLN
500.0000 [IU] | Freq: Every day | INTRAVENOUS | Status: AC | PRN
  Administered 2019-05-22: 500 [IU]
  Filled 2019-05-22: qty 5

## 2019-05-22 MED ORDER — SODIUM CHLORIDE 0.9% FLUSH
10.0000 mL | INTRAVENOUS | Status: AC | PRN
  Administered 2019-05-22: 10 mL
  Filled 2019-05-22: qty 10

## 2019-05-22 MED ORDER — SODIUM CHLORIDE 0.9% IV SOLUTION
250.0000 mL | Freq: Once | INTRAVENOUS | Status: AC
  Administered 2019-05-22: 250 mL via INTRAVENOUS
  Filled 2019-05-22: qty 250

## 2019-05-22 NOTE — Patient Instructions (Signed)
https://www.[facility].org/donate-blood/blood-donation-process/what-happens-to-donated-blood/blood-transfusions/types-of-[email or web address].html"> https://www.[facility].org/donate-blood/blood-donation-process/what-happens-to-donated-blood/blood-transfusions/risks-complications.html">  Blood Transfusion, Adult, Care After This sheet gives you information about how to care for yourself after your procedure. Your health care provider may also give you more specific instructions. If you have problems or questions, contact your health care provider. What can I expect after the procedure? After the procedure, it is common to have:  Bruising and soreness where the IV was inserted.  A fever or chills on the day of the procedure. This may be your body's response to the new blood cells received.  A headache. Follow these instructions at home: IV insertion site care      Follow instructions from your health care provider about how to take care of your IV insertion site. Make sure you: ? Wash your hands with soap and water before and after you change your bandage (dressing). If soap and water are not available, use hand sanitizer. ? Change your dressing as told by your health care provider.  Check your IV insertion site every day for signs of infection. Check for: ? Redness, swelling, or pain. ? Bleeding from the site. ? Warmth. ? Pus or a bad smell. General instructions  Take over-the-counter and prescription medicines only as told by your health care provider.  Rest as told by your health care provider.  Return to your normal activities as told by your health care provider.  Keep all follow-up visits as told by your health care provider. This is important. Contact a health care provider if:  You have itching or red, swollen areas of skin (hives).  You feel anxious.  You feel weak after doing your normal activities.  You have redness, swelling, warmth, or pain around the IV  insertion site.  You have blood coming from the IV insertion site that does not stop with pressure.  You have pus or a bad smell coming from your IV insertion site. Get help right away if:  You have symptoms of a serious allergic or immune system reaction, including: ? Trouble breathing or shortness of breath. ? Swelling of the face or feeling flushed. ? Fever or chills. ? Pain in the head, back, or chest. ? Dark urine or blood in the urine. ? Widespread rash. ? Fast heartbeat. ? Feeling dizzy or light-headed. If you receive your blood transfusion in an outpatient setting, you will be told whom to contact to report any reactions. These symptoms may represent a serious problem that is an emergency. Do not wait to see if the symptoms will go away. Get medical help right away. Call your local emergency services (911 in the [place] Do not drive yourself to the hospital. Summary  Bruising and tenderness around the IV insertion site are common.  Check your IV insertion site every day for signs of infection.  Rest as told by your health care provider. Return to your normal activities as told by your health care provider.  Get help right away for symptoms of a serious allergic or immune system reaction to blood transfusion. This information is not intended to replace advice given to you by your health care provider. Make sure you discuss any questions you have with your health care provider. Document Revised: 06/16/2018 Document Reviewed: 06/16/2018 Elsevier Patient Education  [place].

## 2019-05-22 NOTE — Patient Instructions (Signed)

## 2019-05-26 DIAGNOSIS — R630 Anorexia: Secondary | ICD-10-CM | POA: Diagnosis not present

## 2019-05-26 DIAGNOSIS — C189 Malignant neoplasm of colon, unspecified: Secondary | ICD-10-CM | POA: Diagnosis not present

## 2019-05-26 DIAGNOSIS — R531 Weakness: Secondary | ICD-10-CM | POA: Diagnosis not present

## 2019-05-26 DIAGNOSIS — I10 Essential (primary) hypertension: Secondary | ICD-10-CM | POA: Diagnosis not present

## 2019-05-26 LAB — TYPE AND SCREEN
ABO/RH(D): B NEG
Antibody Screen: NEGATIVE
Unit division: 0
Unit division: 0

## 2019-05-26 LAB — BPAM RBC
Blood Product Expiration Date: 202106042359
Blood Product Expiration Date: 202106092359
ISSUE DATE / TIME: 202105171553
Unit Type and Rh: 1700
Unit Type and Rh: 1700

## 2019-05-31 DIAGNOSIS — C189 Malignant neoplasm of colon, unspecified: Secondary | ICD-10-CM | POA: Diagnosis not present

## 2019-05-31 DIAGNOSIS — R630 Anorexia: Secondary | ICD-10-CM | POA: Diagnosis not present

## 2019-05-31 DIAGNOSIS — R531 Weakness: Secondary | ICD-10-CM | POA: Diagnosis not present

## 2019-05-31 DIAGNOSIS — I10 Essential (primary) hypertension: Secondary | ICD-10-CM | POA: Diagnosis not present

## 2019-06-05 DIAGNOSIS — R630 Anorexia: Secondary | ICD-10-CM | POA: Diagnosis not present

## 2019-06-05 DIAGNOSIS — C189 Malignant neoplasm of colon, unspecified: Secondary | ICD-10-CM | POA: Diagnosis not present

## 2019-06-05 DIAGNOSIS — R531 Weakness: Secondary | ICD-10-CM | POA: Diagnosis not present

## 2019-06-05 DIAGNOSIS — I10 Essential (primary) hypertension: Secondary | ICD-10-CM | POA: Diagnosis not present

## 2019-06-06 ENCOUNTER — Telehealth: Payer: Self-pay | Admitting: Nurse Practitioner

## 2019-06-06 ENCOUNTER — Inpatient Hospital Stay

## 2019-06-06 ENCOUNTER — Other Ambulatory Visit: Payer: Self-pay

## 2019-06-06 ENCOUNTER — Encounter: Payer: Self-pay | Admitting: Nurse Practitioner

## 2019-06-06 ENCOUNTER — Inpatient Hospital Stay (HOSPITAL_BASED_OUTPATIENT_CLINIC_OR_DEPARTMENT_OTHER): Admitting: Nurse Practitioner

## 2019-06-06 ENCOUNTER — Inpatient Hospital Stay: Attending: Oncology

## 2019-06-06 ENCOUNTER — Telehealth: Payer: Self-pay | Admitting: *Deleted

## 2019-06-06 VITALS — BP 111/49 | HR 100 | Temp 98.2°F | Resp 18

## 2019-06-06 DIAGNOSIS — R0609 Other forms of dyspnea: Secondary | ICD-10-CM | POA: Diagnosis not present

## 2019-06-06 DIAGNOSIS — D649 Anemia, unspecified: Secondary | ICD-10-CM

## 2019-06-06 DIAGNOSIS — C189 Malignant neoplasm of colon, unspecified: Secondary | ICD-10-CM

## 2019-06-06 DIAGNOSIS — Z95828 Presence of other vascular implants and grafts: Secondary | ICD-10-CM

## 2019-06-06 DIAGNOSIS — I10 Essential (primary) hypertension: Secondary | ICD-10-CM | POA: Diagnosis not present

## 2019-06-06 DIAGNOSIS — C184 Malignant neoplasm of transverse colon: Secondary | ICD-10-CM | POA: Diagnosis present

## 2019-06-06 DIAGNOSIS — C7889 Secondary malignant neoplasm of other digestive organs: Secondary | ICD-10-CM | POA: Insufficient documentation

## 2019-06-06 DIAGNOSIS — C799 Secondary malignant neoplasm of unspecified site: Secondary | ICD-10-CM

## 2019-06-06 DIAGNOSIS — R531 Weakness: Secondary | ICD-10-CM | POA: Diagnosis not present

## 2019-06-06 DIAGNOSIS — R630 Anorexia: Secondary | ICD-10-CM | POA: Diagnosis not present

## 2019-06-06 LAB — CBC WITH DIFFERENTIAL (CANCER CENTER ONLY)
Abs Immature Granulocytes: 0.2 10*3/uL — ABNORMAL HIGH (ref 0.00–0.07)
Basophils Absolute: 0.1 10*3/uL (ref 0.0–0.1)
Basophils Relative: 1 %
Eosinophils Absolute: 0 10*3/uL (ref 0.0–0.5)
Eosinophils Relative: 0 %
HCT: 22.9 % — ABNORMAL LOW (ref 39.0–52.0)
Hemoglobin: 7 g/dL — ABNORMAL LOW (ref 13.0–17.0)
Immature Granulocytes: 1 %
Lymphocytes Relative: 3 %
Lymphs Abs: 0.6 10*3/uL — ABNORMAL LOW (ref 0.7–4.0)
MCH: 26.8 pg (ref 26.0–34.0)
MCHC: 30.6 g/dL (ref 30.0–36.0)
MCV: 87.7 fL (ref 80.0–100.0)
Monocytes Absolute: 1 10*3/uL (ref 0.1–1.0)
Monocytes Relative: 4 %
Neutro Abs: 20.6 10*3/uL — ABNORMAL HIGH (ref 1.7–7.7)
Neutrophils Relative %: 91 %
Platelet Count: 391 10*3/uL (ref 150–400)
RBC: 2.61 MIL/uL — ABNORMAL LOW (ref 4.22–5.81)
RDW: 19 % — ABNORMAL HIGH (ref 11.5–15.5)
WBC Count: 22.5 10*3/uL — ABNORMAL HIGH (ref 4.0–10.5)
nRBC: 0 % (ref 0.0–0.2)

## 2019-06-06 LAB — SAMPLE TO BLOOD BANK

## 2019-06-06 MED ORDER — SODIUM CHLORIDE 0.9% FLUSH
10.0000 mL | Freq: Once | INTRAVENOUS | Status: AC
  Administered 2019-06-06: 10 mL
  Filled 2019-06-06: qty 10

## 2019-06-06 MED ORDER — HEPARIN SOD (PORK) LOCK FLUSH 100 UNIT/ML IV SOLN
500.0000 [IU] | Freq: Once | INTRAVENOUS | Status: AC
  Administered 2019-06-06: 500 [IU]
  Filled 2019-06-06: qty 5

## 2019-06-06 NOTE — Progress Notes (Addendum)
[facility] OFFICE PROGRESS NOTE   Diagnosis: Colon cancer  INTERVAL HISTORY:   [facility] Suarez returns prior to scheduled follow-up.  He is weak.  He has shortness of breath on exertion.  His wife reports a maroon-colored loose stool earlier today.  He took an Ativan tablet this morning in addition to a nausea medication.  His wife noted increased sedation.  He denies pain.  Appetite remains poor.  Objective:  Vital signs in last 24 hours:  Blood pressure (!) 111/49, pulse 100, temperature 98.2 F (36.8 C), temperature source Oral, resp. rate 18, SpO2 99 %.   Resp: Lungs clear bilaterally. Cardio: Regular rate and rhythm. GI: Abdomen soft and nontender.  No hepatomegaly.  No mass. Vascular: No leg edema. Neuro: Alert and oriented. Skin: Pale-appearing.   Lab Results:  Lab Results  Component Value Date   WBC 22.5 (H) 06/06/2019   HGB 7.0 (L) 06/06/2019   HCT 22.9 (L) 06/06/2019   MCV 87.7 06/06/2019   PLT 391 06/06/2019   NEUTROABS 20.6 (H) 06/06/2019    Imaging:  No results found.  Medications: I have reviewed the patient's current medications.  Assessment/Plan: 1. 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 Negative for a BRAF mutation  Tumor invaded through the muscularis propria into pericolonic fatty tissue and involved the attached omentum.   Cycle 1 adjuvant Xeloda 05/28/2013.   Cycle 2 adjuvant Xeloda 06/18/2013.   Cycle 3 adjuvant Xeloda 07/09/2013.   Cycle 4 adjuvant Xeloda 07/30/2013.   Cycle 5 adjuvant Xeloda 08/20/2013.  Cycle 6 adjuvant Xeloda 09/11/2013.  cycle 7 adjuvant Xeloda 10/02/2013  Cycle 8 adjuvant Xeloda 10/23/2013  CTs of the chest, abdomen, and pelvis on 07/18/2014-negative for recurrent colon cancer   CTs abdomen/pelvis 09/06/2014  showed acute cholecystitis.  Colonoscopy 08/02/2015-chronic colitis, no malignancy  CTs 09/17/2015, new mass near the tail the pancreas, stable lung nodules  Biopsy mesenteric mass 10/04/2015 with metastatic adenocarcinoma consistent with colorectal primary  PET scan 10/10/2015 with soft tissue thickening within the peritoneal space of the left upper quadrant with SUV Max 7.9; no additional sites of peritoneal nodularity to suggest metastasis; hypermetabolic ill-defined nodule in the left lower lobe favored inflammatory.  Resection of the left abdominal mesenteric mass/tail the pancreas on 11/05/2015 with the pathology confirming metastatic colon cancer, positive "serosal" margin  CTs 07/29/2016-no evidence of metastatic disease, stable small lung nodules, decreased size of pancreatic resection bed fluid/mass  CTs 06/28/2017-decreased fluid and increased soft tissue at the area of the distal pancreatectomy with a few foci of gas  CT 09/29/2017-enlargement of left upper quadrant soft tissue mass between the stomach, spleen, and pancreas tail, stable nodules at the lung bases  CT biopsy of the upper quadrant mass 10/19/2017-metastatic colon cancer, MSS, tumor mutation burden 1, no KRAS, BRAF, or NRAS mutation  CTs 12/24/2017-complex left upper quadrant process involving the stomach, spleen, tail the pancreas, and distal transverse colon.  CT 04/27/2018-stable to mild progression of the left upper quadrant mass, compression of the stomach with no obstruction, no other evidence of metastatic disease, resolution of small fluid collection lateral to the left upper quadrant mass  CT 07/21/2018-enlargement of the left upper quadrant mass with involvement of the posterior stomach and spleen. Possible new right upper lung nodule  Cycle 1 FOLFOX 08/17/2018  Cycle 2 FOLFOX 08/31/2018, Udenyca added  Cycle 3 FOLFOX 09/14/2018, Ellen Henri  Cycle 4 FOLFOX 09/29/2018, oxaliplatin and 5-fluorouracil dose  reduced 5-FU bolus held  CT abdomen/pelvis 10/12/2018-decrease in size of mass at the pancreas tail/posterior gastric wall, and splenic flexure of the colon, mass is more cystic  CT abdomen/pelvis 03/30/2019-significant enlargement of the left upper quadrant mass with invasion of the stomach, pancreas, spleen, andcolon, small left pleural effusion  Cycle 5 FOLFOX 04/18/2019 2. Ulcerative colitis-extensive chronic active ulcerative colitis was noted on the colon resection specimen 03/28/2013.  Followed by Dr.Magod 3. Hypertension.  4. History of microcytic anemia-likely iron deficiency, unable to tolerate oral iron.  5. Possible area of cecal wall thickening noted on abdominal CT 03/20/2013. 6. Family history of colon cancer (maternal grandfather died in his 28s with colon cancer). 7. Bilateral calf and low anterior leg pain. Bilateral lower extremity venous duplex negative for DVT 07/14/2013. Right ABI with moderate, borderline severe arterial insufficiency; left ABI suggestive of moderate arterial insufficiency. He was referred to vascular. 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 TEE showed thrombus in the descending aorta. He underwent right popliteal to peroneal artery bypass graft, thrombectomy anterior tibialis and attempted thrombectomy tibioperoneal trunk and posterior tibialis on 08/11/2013. Right calf pain 09/26/2013 with findings of occlusion of right popliteal to peroneal bypass status post thrombolysis. Now on anticoagulation with Xarelto.  recurrent pain and ulceration at the right foot, status post a popliteal to posterior tibial artery bypass using a cephalic vein graft on 46/65/9935  Xarelto discontinued due to high risk for bleeding and aspirin 81 mg daily initiated hospitalization July 2020 8. Status post laparoscopic cholecystectomy 09/08/2014 9. Anorexia following the mesenteric mass/pancreas tail resection-improved 10. Abdominal abscess November  2017-pancreas "cysts "drainage catheter placed 11/26/2015; CT 02/04/2016 with no change to slight decreasein size of residual pancreatic tail fluid collection.Drainage catheter removed approximately early March 2018  CT 02/20/2016-new small left pleural effusion, increased size of the surgical bed fluid collection, indeterminate pulmonary nodules  CT 07/29/2016-decreased fluid/soft tissue density near the pancreas is no evidence of metastatic disease, stable subcentimeter lung nodules 11.Admission 12/24/2017 with an abdominal abscess, status post placement of a percutaneous drain and antibiotic therapy; CTs abdomen/pelvis 01/04/2018-near complete drainage of previously noted complex fluid collection in the left upper quadrant. 2 small residual collections remain. Residual soft tissue mass at the clips concerning for residual recurrent neoplasm. Peritoneal wall thickening also concerning for metastatic disease. Drain removed 01/04/2018  CT 01/18/2018-no change in soft tissue mass at the tail of pancreas with involvement of the gastric fundus and spleen, there is central low attenuation  Admission 01/20/2018 with fever and failure to thrive, no drainable fluid collection, placed on IV antibiotics, discharged to complete an outpatient course of IV ceftriaxone and Flagyl completed 02/20/2018  12.Admission 7/26/2020transient speech abnormality and microcytic anemia  13.Port-A-Cath placement, Dr. 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  He is enrolled in the hospice program.  He presents today with symptomatic anemia.  We are making arrangements for a blood transfusion.  He will return as scheduled for follow-up on 06/29/2019.  Patient seen with Dr. Benay Spice.    Ned Card ANP/GNP-BC   06/06/2019  4:02 PM This was a shared visit with Ned Card.  Mr.  Suarez has developed severe anemia.  The anemia he is likely secondary to GI bleeding from tumor invading the gastrointestinal tract.  He is symptomatic and will be transfused with packed red blood cells.  He continues follow-up with the home hospice program.  Julieanne Manson, MD

## 2019-06-06 NOTE — Telephone Encounter (Signed)
Scheduled appt per 6/1 sch message - pt is aware.

## 2019-06-06 NOTE — Telephone Encounter (Addendum)
Reports patient says "I just don't feel right since my last blood transfusion. I feel jittery and I don't know what's going on with me". Asking to move his 06/29/19 Lab/flush/OV to 1st available. RN initiated standing order for Ativan 0.5 mg prn. BP is 110/58 with resting pulse of 102. Does not appear short of breath or no obvious bleeding.  Called patient back and he reports feeling a little better with the Ativan, but still feels he needs to be seen this week if possible, but needs afternoons. High priority scheduling message sent.

## 2019-06-07 ENCOUNTER — Telehealth: Payer: Self-pay

## 2019-06-07 ENCOUNTER — Inpatient Hospital Stay

## 2019-06-07 ENCOUNTER — Other Ambulatory Visit: Payer: Self-pay

## 2019-06-07 ENCOUNTER — Other Ambulatory Visit: Payer: Self-pay | Admitting: *Deleted

## 2019-06-07 ENCOUNTER — Other Ambulatory Visit: Payer: Self-pay | Admitting: Nurse Practitioner

## 2019-06-07 DIAGNOSIS — C189 Malignant neoplasm of colon, unspecified: Secondary | ICD-10-CM

## 2019-06-07 DIAGNOSIS — C184 Malignant neoplasm of transverse colon: Secondary | ICD-10-CM | POA: Diagnosis not present

## 2019-06-07 LAB — PREPARE RBC (CROSSMATCH)

## 2019-06-07 MED ORDER — SODIUM CHLORIDE 0.9% IV SOLUTION
250.0000 mL | Freq: Once | INTRAVENOUS | Status: DC
  Filled 2019-06-07: qty 250

## 2019-06-07 MED ORDER — SODIUM CHLORIDE 0.9% FLUSH
3.0000 mL | INTRAVENOUS | Status: AC | PRN
  Administered 2019-06-07: 3 mL
  Filled 2019-06-07: qty 10

## 2019-06-07 MED ORDER — HEPARIN SOD (PORK) LOCK FLUSH 100 UNIT/ML IV SOLN
250.0000 [IU] | INTRAVENOUS | Status: AC | PRN
  Administered 2019-06-07: 250 [IU]
  Filled 2019-06-07: qty 5

## 2019-06-07 NOTE — Telephone Encounter (Signed)
TC to pt per Ned Card NP to  instruct him to discontinue aspirin. Patient verbalized understanding.

## 2019-06-07 NOTE — Patient Instructions (Signed)
Blood Transfusion, Adult, Care After This sheet gives you information about how to care for yourself after your procedure. Your doctor may also give you more specific instructions. If you have problems or questions, contact your doctor. What can I expect after the procedure? After the procedure, it is common to have:  Bruising and soreness at the IV site.  A fever or chills on the day of the procedure. This may be your body's response to the new blood cells received.  A headache. Follow these instructions at home: Insertion site care      Follow instructions from your doctor about how to take care of your insertion site. This is where an IV tube was put into your vein. Make sure you: ? Wash your hands with soap and water before and after you change your bandage (dressing). If you cannot use soap and water, use hand sanitizer. ? Change your bandage as told by your doctor.  Check your insertion site every day for signs of infection. Check for: ? Redness, swelling, or pain. ? Bleeding from the site. ? Warmth. ? Pus or a bad smell. General instructions  Take over-the-counter and prescription medicines only as told by your doctor.  Rest as told by your doctor.  Go back to your normal activities as told by your doctor.  Keep all follow-up visits as told by your doctor. This is important. Contact a doctor if:  You have itching or red, swollen areas of skin (hives).  You feel worried or nervous (anxious).  You feel weak after doing your normal activities.  You have redness, swelling, warmth, or pain around the insertion site.  You have blood coming from the insertion site, and the blood does not stop with pressure.  You have pus or a bad smell coming from the insertion site. Get help right away if:  You have signs of a serious reaction. This may be coming from an allergy or the body's defense system (immune system). Signs include: ? Trouble breathing or shortness of  breath. ? Swelling of the face or feeling warm (flushed). ? Fever or chills. ? Head, chest, or back pain. ? Dark pee (urine) or blood in the pee. ? Widespread rash. ? Fast heartbeat. ? Feeling dizzy or light-headed. You may receive your blood transfusion in an outpatient setting. If so, you will be told whom to contact to report any reactions. These symptoms may be an emergency. Do not wait to see if the symptoms will go away. Get medical help right away. Call your local emergency services (911 in the [place] Do not drive yourself to the hospital. Summary  Bruising and soreness at the IV site are common.  Check your insertion site every day for signs of infection.  Rest as told by your doctor. Go back to your normal activities as told by your doctor.  Get help right away if you have signs of a serious reaction. This information is not intended to replace advice given to you by your health care provider. Make sure you discuss any questions you have with your health care provider. Document Revised: 06/16/2018 Document Reviewed: 06/16/2018 Elsevier Patient Education  [place].

## 2019-06-07 NOTE — Telephone Encounter (Signed)
TC to pt to let him know that he is getting 1 unit of blood today at 2:30p. Patient verbalized understanding

## 2019-06-08 ENCOUNTER — Inpatient Hospital Stay: Admitting: Nurse Practitioner

## 2019-06-08 ENCOUNTER — Inpatient Hospital Stay

## 2019-06-08 ENCOUNTER — Other Ambulatory Visit: Payer: Self-pay | Admitting: *Deleted

## 2019-06-08 LAB — BPAM RBC
Blood Product Expiration Date: 202106292359
ISSUE DATE / TIME: 202106021527
Unit Type and Rh: 1700

## 2019-06-08 LAB — TYPE AND SCREEN
ABO/RH(D): B NEG
Antibody Screen: NEGATIVE
Unit division: 0

## 2019-06-08 NOTE — Progress Notes (Signed)
Patient asking if he can resume aspirin. Per Dr. Payton Doughty off aspirin due to GI bleed.

## 2019-06-12 ENCOUNTER — Encounter: Payer: Self-pay | Admitting: Cardiology

## 2019-06-12 ENCOUNTER — Telehealth (INDEPENDENT_AMBULATORY_CARE_PROVIDER_SITE_OTHER): Payer: Medicare Other | Admitting: Cardiology

## 2019-06-12 VITALS — BP 113/57 | HR 104 | Ht 70.0 in

## 2019-06-12 DIAGNOSIS — C189 Malignant neoplasm of colon, unspecified: Secondary | ICD-10-CM | POA: Diagnosis not present

## 2019-06-12 DIAGNOSIS — I749 Embolism and thrombosis of unspecified artery: Secondary | ICD-10-CM

## 2019-06-12 NOTE — Patient Instructions (Signed)
Medication Instructions:  Your physician recommends that you continue on your current medications as directed. Please refer to the Current Medication list given to you today.  *If you need a refill on your cardiac medications before your next appointment, please call your pharmacy*   Follow-Up: At [facility], you and your health needs are our priority.  As part of our continuing mission to provide you with exceptional heart care, we have created designated Provider Care Teams.  These Care Teams include your primary Cardiologist (physician) and Advanced Practice Providers (APPs -  Physician Assistants and Nurse Practitioners) who all work together to provide you with the care you need, when you need it.  We recommend signing up for the patient portal called "MyChart".  Sign up information is provided on this After Visit Summary.  MyChart is used to connect with patients for Virtual Visits (Telemedicine).  Patients are able to view lab/test results, encounter notes, upcoming appointments, etc.  Non-urgent messages can be sent to your provider as well.   To learn more about what you can do with MyChart, go to [email or web address].    Your next appointment:   As needed

## 2019-06-12 NOTE — Progress Notes (Signed)
Virtual Visit via Telephone Note   This visit type was conducted due to national recommendations for restrictions regarding the COVID-19 Pandemic (e.g. social distancing) in an effort to limit this patient's exposure and mitigate transmission in our community.  Due to his co-morbid illnesses, this patient is at least at moderate risk for complications without adequate follow up.  This format is felt to be most appropriate for this patient at this time.  The patient did not have access to video technology/had technical difficulties with video requiring transitioning to audio format only (telephone).  All issues noted in this document were discussed and addressed.  No physical exam could be performed with this format.  Please refer to the patient's chart for his  consent to telehealth for [facility].   The patient was identified using 2 identifiers.  Date:  06/12/2019   ID:  Cody [name]., DOB 08-09-1945, MRN [id number]  Patient Location: Home Provider Location: Home  PCP:  Midge Minium, MD  Cardiologist:  Kathlyn Sacramento, MD  Electrophysiologist:  None   Evaluation Performed:  Follow-Up Visit  Chief Complaint:  No cardiac issues  History of Present Illness:    Cody Berkley. is a 74 y.o. 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  He also has hypertension.  He does have a history of colon cancer and had surgery in 2015.  Unfortunately recently he has had recurrence of his colon cancer with metastasis to his pancreas, spleen, and stomach.  He is now on hospice.  His hypertensive medications have all been stopped.  I spoke with his wife Cody Suarez.  She says he is not doing well today, he is not eating and has not been out of bed.  She says his blood pressures under pretty good control although she has noted his heart rate has been elevated at times.  As noted above he is being followed by hospice.  The patient does not have  symptoms concerning for COVID-19 infection (fever, chills, cough, or new shortness of breath).    Past Medical History:  Diagnosis Date  . Anemia of chronic disease 2015   "related to cancer tx" (08/09/2013)  . Blood clot in vein 2015  . Colon cancer ([place]) 03/2013  . History of kidney stones    x 2passed them both  . Hypertension   . Peripheral vascular disease ([place])   . Ulcerative colitis ([place])    history   Past Surgical History:  Procedure Laterality Date  . ABDOMINAL AORTAGRAM N/A 08/09/2013   Procedure: ABDOMINAL Maxcine Ham;  Surgeon: [name], MD;  Location: [facility];  Service: Cardiovascular;  Laterality: N/A;  . CHOLECYSTECTOMY N/A 09/08/2014   Procedure: LAPAROSCOPIC CHOLECYSTECTOMY WITH INTRAOPERATIVE CHOLANGIOGRAM;  Surgeon: Armandina Gemma, MD;  Location: [facility];  Service: General;  Laterality: N/A;  . COLON SURGERY  02/2013  . COLON SURGERY  10/2015  . FEMORAL-POPLITEAL BYPASS GRAFT Right 08/11/2013   Procedure:   RIGHT - POPLITEAL TO PERONEAL ARTERY BYPASS GRAFT  WITH NONREVERSED SAPHENOUS VEIN GRAFT,tHROMBECTOMY ANTERIOR TIBIALIS,ATTEMPTED THROMBECTOMY TIBIO-PERONEAL TRUNK AND POSTERIOR TIBIALIS, INTRAOPERATIVE ARTERIOGRAM.;  Surgeon: Mal Misty, MD;  Location: [facility];  Service: Vascular;  Laterality: Right;  . FEMORAL-TIBIAL BYPASS GRAFT Right 11/17/2013   Procedure: BYPASS GRAFT RIGHT ABOVE KNEE POPLITEAL TO POSTERIOR TIBIAL ARTERY USING RIGHT NON-REVERSED CEPHALIC VEIN;  Surgeon: Mal Misty, MD;  Location: [facility];  Service: Vascular;  Laterality: Right;  . HERNIA REPAIR  ~  1995; 03/2013   UHR  . INTRAOPERATIVE ARTERIOGRAM Right 11/17/2013   Procedure: INTRA OPERATIVE ARTERIOGRAM;  Surgeon: Mal Misty, MD;  Location: [facility] OR;  Service: Vascular;  Laterality: Right;  . IR GENERIC HISTORICAL  12/11/2015   IR RADIOLOGIST EVAL & MGMT 12/11/2015 Arne Cleveland, MD [facility]  . IR GENERIC HISTORICAL  12/24/2015   IR RADIOLOGIST EVAL & MGMT 12/24/2015 Jacqulynn Cadet, MD [facility]  . IR GENERIC HISTORICAL  01/21/2016   IR RADIOLOGIST EVAL & MGMT 01/21/2016 [name], MD [facility]  . IR GENERIC HISTORICAL  02/04/2016   IR RADIOLOGIST EVAL & MGMT 02/04/2016 Sandi Mariscal, MD [facility]  . IR GENERIC HISTORICAL  02/12/2016   IR CATHETER TUBE CHANGE 02/12/2016 Corrie Mckusick, DO [facility]  . IR GENERIC HISTORICAL  02/20/2016   IR RADIOLOGIST EVAL & MGMT 02/20/2016 Markus Daft, MD [facility]  . IR SINUS/FIST TUBE CHK-NON GI  01/04/2018  . LAPAROSCOPIC RIGHT HEMI COLECTOMY N/A 03/28/2013   Procedure: LAPAROSCOPIC ASSISTED HEMI COLECTOMY;  Surgeon: Pedro Earls, MD;  [facility];  Service: General;  Laterality: N/A;  . LAPAROSCOPY N/A 11/05/2015   Procedure: LAPAROSCOPY DIAGNOSTIC;  Surgeon: [name], MD;  Location: [facility];  Service: General;  Laterality: N/A;  . LAPAROTOMY N/A 11/05/2015   Procedure: EXPLORATORY LAPAROTOMY, resection of mass at tail of pancreas;  Surgeon: [name], MD;  Location: [facility];  Service: General;  Laterality: N/A;  . LOWER EXTREMITY ANGIOGRAM Bilateral 08/09/2013  . PORTACATH PLACEMENT Left 08/15/2018   Procedure: PORT A CATH PLACEMENT;  Surgeon: [name], MD;  [facility];  Service: General;  Laterality: Left;  . TEE WITHOUT CARDIOVERSION N/A 08/10/2013   Procedure: TRANSESOPHAGEAL ECHOCARDIOGRAM (TEE);  Surgeon: Candee Furbish, MD;  [facility];  Service: Cardiovascular;  Laterality: N/A;  . TONSILLECTOMY  ~ 1950  . [place]; 03/2013  . VASECTOMY    . VEIN HARVEST Right 11/17/2013   Procedure: HARVEST OF RIGHT UPPER EXTREMITY CEPHALIC VEIN;  Surgeon: Mal Misty, MD;  Location: [facility];  Service: Vascular;  Laterality: Right;     Current Meds  Medication Sig  . acetaminophen (TYLENOL) 500 MG tablet Take 1,000 mg by mouth daily as needed for mild pain or headache.   . calcium carbonate (TUMS EX) 750 MG chewable tablet Chew 1-2 tablets by  mouth daily as needed for heartburn.  . Homeopathic Products [facility] RELIEF EX) Apply 1 application topically daily as needed (calf pain).  Marland Kitchen HYDROcodone-acetaminophen (NORCO/VICODIN) 5-325 MG tablet Take 1 tablet by mouth every 6 (six) hours as needed for moderate pain.  Marland Kitchen HYDROmorphone (DILAUDID) 2 MG tablet Take 1-2 tablets (2-4 mg total) by mouth every 4 (four) hours as needed for severe pain.  Marland Kitchen lidocaine-prilocaine (EMLA) cream Apply 1 tsp to port site 1 hour prior to stick and cover with plastic wrap  . Liniments (BLUE-EMU SUPER STRENGTH EX) Apply 1 application topically daily as needed (knee pain).  . potassium chloride SA (KLOR-CON) 20 MEQ tablet Take 1 tablet (20 mEq total) by mouth 2 (two) times daily.  . predniSONE (DELTASONE) 10 MG tablet Take 1 tablet by mouth once daily with food  . prochlorperazine (COMPAZINE) 10 MG tablet Take 1 tablet (10 mg total) by mouth every 6 (six) hours as needed for nausea.  Marland Kitchen sulfaSALAzine (AZULFIDINE) 500 MG EC tablet Take 1,500 mg by mouth every other day.   . vitamin B-12 (CYANOCOBALAMIN)  1000 MCG tablet Take 1,000 mcg by mouth daily.     Allergies:   Amlodipine and Gabapentin   Social History   Tobacco Use  . Smoking status: Never Smoker  . Smokeless tobacco: Never Used  . Tobacco comment: states his father died of emphysema from smoking, he did not want to be like that  Substance Use Topics  . Alcohol use: No  . Drug use: No     Family Hx: The patient's family history includes Emphysema in his father; Heart disease in his mother.  ROS:   Please see the history of present illness.    All other systems reviewed and are negative.   Prior CV studies:   The following studies were reviewed today:    Labs/Other Tests and Data Reviewed:    EKG:  An ECG dated 08/21/2018 was personally reviewed today and demonstrated:  NSR  Recent Labs: 05/03/2019: ALT 6; BUN 11; Creatinine 0.84; Potassium 3.8; Sodium 138 06/06/2019: Hemoglobin 7.0;  Platelet Count 391   Recent Lipid Panel Lab Results  Component Value Date/Time   CHOL 93 08/01/2018 04:45 AM   CHOL 132 11/05/2016 08:55 AM   TRIG 101 08/01/2018 04:45 AM   HDL 18 (L) 08/01/2018 04:45 AM   HDL 42 11/05/2016 08:55 AM   CHOLHDL 5.2 08/01/2018 04:45 AM   LDLCALC 55 08/01/2018 04:45 AM   LDLCALC 59 11/05/2016 08:55 AM   LDLDIRECT 91.0 11/12/2017 01:53 PM    Wt Readings from Last 3 Encounters:  05/18/19 141 lb 12.8 oz (64.3 kg)  05/03/19 143 lb 1.6 oz (64.9 kg)  04/18/19 143 lb 14.4 oz (65.3 kg)     Objective:    Vital Signs:  BP (!) 113/57 Comment: yesterday, 6/6  Pulse (!) 104 Comment: yesterday, 6/6  Ht 5' 10"  (1.778 m)   BMI 20.35 kg/m    VITAL SIGNS:  reviewed  ASSESSMENT & PLAN:    PVD- H/O ruptured ascending aortic plaque with LEA embolism.  Metastatic colon cancer- Pt is DNR and on Hospice at home.  Plan:  I suggested they could stop his K+ supplement- torsemide was discontinued a couple of months ago.  I did not order any labs.  F/U PRN.    COVID-19 Education: The signs and symptoms of COVID-19 were discussed with the patient and how to seek care for testing (follow up with PCP or arrange E-visit).  The importance of social distancing was discussed today.  Time:   Today, I have spent 15 minutes with the patient with telehealth technology discussing the above problems.     Medication Adjustments/Labs and Tests Ordered: Current medicines are reviewed at length with the patient today.  Concerns regarding medicines are outlined above.   Tests Ordered: No orders of the defined types were placed in this encounter.   Medication Changes: No orders of the defined types were placed in this encounter.   Follow Up:  Either In Person or Virtual prn  Signed, Kerin Ransom, PA-C  06/12/2019 11:35 AM    [place]

## 2019-06-15 DIAGNOSIS — I10 Essential (primary) hypertension: Secondary | ICD-10-CM | POA: Diagnosis not present

## 2019-06-15 DIAGNOSIS — R531 Weakness: Secondary | ICD-10-CM | POA: Diagnosis not present

## 2019-06-15 DIAGNOSIS — C189 Malignant neoplasm of colon, unspecified: Secondary | ICD-10-CM | POA: Diagnosis not present

## 2019-06-15 DIAGNOSIS — R630 Anorexia: Secondary | ICD-10-CM | POA: Diagnosis not present

## 2019-06-16 ENCOUNTER — Telehealth: Payer: Self-pay | Admitting: *Deleted

## 2019-06-16 NOTE — Telephone Encounter (Signed)
RN called to report he had diarrhea x 2 yesterday and patient reports his stool was black. They initiated standing order for Lomotil for him. Has only had 1 stool today. Temp 99.0. Wife continues to check his v/s twice daily and RN told her to call if his BP starts dropping and pulse goes up. MD notified.

## 2019-06-21 DIAGNOSIS — R531 Weakness: Secondary | ICD-10-CM | POA: Diagnosis not present

## 2019-06-21 DIAGNOSIS — I10 Essential (primary) hypertension: Secondary | ICD-10-CM | POA: Diagnosis not present

## 2019-06-21 DIAGNOSIS — C189 Malignant neoplasm of colon, unspecified: Secondary | ICD-10-CM | POA: Diagnosis not present

## 2019-06-21 DIAGNOSIS — R630 Anorexia: Secondary | ICD-10-CM | POA: Diagnosis not present

## 2019-06-22 DIAGNOSIS — I10 Essential (primary) hypertension: Secondary | ICD-10-CM | POA: Diagnosis not present

## 2019-06-22 DIAGNOSIS — R531 Weakness: Secondary | ICD-10-CM | POA: Diagnosis not present

## 2019-06-22 DIAGNOSIS — R630 Anorexia: Secondary | ICD-10-CM | POA: Diagnosis not present

## 2019-06-22 DIAGNOSIS — C189 Malignant neoplasm of colon, unspecified: Secondary | ICD-10-CM | POA: Diagnosis not present

## 2019-06-26 ENCOUNTER — Telehealth: Payer: Self-pay | Admitting: *Deleted

## 2019-06-26 NOTE — Telephone Encounter (Signed)
Patient reports hydrocodone-apap causing itching. Wants to resume the hydromorphone he has at home from March 2021. Asking if this is OK and if they can refill? Confirmed OK to resume hydromorphone 2-4 mg every 4 hours prn pain.

## 2019-06-27 ENCOUNTER — Other Ambulatory Visit: Payer: Self-pay | Admitting: Oncology

## 2019-06-27 MED ORDER — HYDROMORPHONE HCL 2 MG PO TABS: 2.0000 mg | ORAL_TABLET | ORAL | 0 refills | Status: AC | PRN

## 2019-06-28 ENCOUNTER — Telehealth: Payer: Self-pay | Admitting: *Deleted

## 2019-06-28 DIAGNOSIS — C189 Malignant neoplasm of colon, unspecified: Secondary | ICD-10-CM | POA: Diagnosis not present

## 2019-06-28 DIAGNOSIS — R531 Weakness: Secondary | ICD-10-CM | POA: Diagnosis not present

## 2019-06-28 DIAGNOSIS — I10 Essential (primary) hypertension: Secondary | ICD-10-CM | POA: Diagnosis not present

## 2019-06-28 DIAGNOSIS — R630 Anorexia: Secondary | ICD-10-CM | POA: Diagnosis not present

## 2019-06-28 NOTE — Telephone Encounter (Signed)
Estill Bamberg, RN from Hospice called to say Cody Suarez is very weak and cannot make appts tomorrow. She will flush he post every 6 weeks.  Appts cancelled

## 2019-06-29 ENCOUNTER — Inpatient Hospital Stay: Admitting: Nurse Practitioner

## 2019-06-29 ENCOUNTER — Inpatient Hospital Stay

## 2019-07-04 DIAGNOSIS — I10 Essential (primary) hypertension: Secondary | ICD-10-CM | POA: Diagnosis not present

## 2019-07-04 DIAGNOSIS — C189 Malignant neoplasm of colon, unspecified: Secondary | ICD-10-CM | POA: Diagnosis not present

## 2019-07-04 DIAGNOSIS — R630 Anorexia: Secondary | ICD-10-CM | POA: Diagnosis not present

## 2019-07-04 DIAGNOSIS — R531 Weakness: Secondary | ICD-10-CM | POA: Diagnosis not present

## 2019-07-06 DIAGNOSIS — C189 Malignant neoplasm of colon, unspecified: Secondary | ICD-10-CM | POA: Diagnosis not present

## 2019-07-06 DIAGNOSIS — R630 Anorexia: Secondary | ICD-10-CM | POA: Diagnosis not present

## 2019-07-06 DIAGNOSIS — I10 Essential (primary) hypertension: Secondary | ICD-10-CM | POA: Diagnosis not present

## 2019-07-06 DIAGNOSIS — R531 Weakness: Secondary | ICD-10-CM | POA: Diagnosis not present

## 2019-07-10 DIAGNOSIS — R531 Weakness: Secondary | ICD-10-CM | POA: Diagnosis not present

## 2019-07-10 DIAGNOSIS — C189 Malignant neoplasm of colon, unspecified: Secondary | ICD-10-CM | POA: Diagnosis not present

## 2019-07-10 DIAGNOSIS — I10 Essential (primary) hypertension: Secondary | ICD-10-CM | POA: Diagnosis not present

## 2019-07-10 DIAGNOSIS — R630 Anorexia: Secondary | ICD-10-CM | POA: Diagnosis not present

## 2019-07-11 DIAGNOSIS — C189 Malignant neoplasm of colon, unspecified: Secondary | ICD-10-CM | POA: Diagnosis not present

## 2019-07-11 DIAGNOSIS — I10 Essential (primary) hypertension: Secondary | ICD-10-CM | POA: Diagnosis not present

## 2019-07-11 DIAGNOSIS — R531 Weakness: Secondary | ICD-10-CM | POA: Diagnosis not present

## 2019-07-11 DIAGNOSIS — R630 Anorexia: Secondary | ICD-10-CM | POA: Diagnosis not present

## 2019-07-12 ENCOUNTER — Telehealth: Payer: Self-pay | Admitting: *Deleted

## 2019-07-12 DIAGNOSIS — C189 Malignant neoplasm of colon, unspecified: Secondary | ICD-10-CM | POA: Diagnosis not present

## 2019-07-12 DIAGNOSIS — R531 Weakness: Secondary | ICD-10-CM | POA: Diagnosis not present

## 2019-07-12 DIAGNOSIS — I10 Essential (primary) hypertension: Secondary | ICD-10-CM | POA: Diagnosis not present

## 2019-07-12 DIAGNOSIS — R630 Anorexia: Secondary | ICD-10-CM | POA: Diagnosis not present

## 2019-08-06 NOTE — Telephone Encounter (Signed)
Patient died in home today at 45.

## 2019-08-06 DEATH — deceased

## 2019-10-15 IMAGING — CT CT ABD-PELV W/ CM
2 of 5 series · 15 of 46 positions shown, 17 images · IV contrast (Omni 300)
Comparison: CT of the abdomen and pelvis 12/27/2017

CLINICAL DATA: Intra-abdominal abscess.

EXAM:
CT ABDOMEN AND PELVIS WITH CONTRAST
TECHNIQUE: Multidetector CT imaging of the abdomen and pelvis was performed
using the standard protocol following bolus administration of
intravenous contrast.
CONTRAST:  100mL OMNIPAQUE IOHEXOL 300 MG/ML  SOLN

[Series 3: a/p w/ 5mm · axial · 0.89mm/px · z∈[+855,+1325]mm · 12 of 104 slices shown, 14 images]
[im 5/104  soft-tissue]
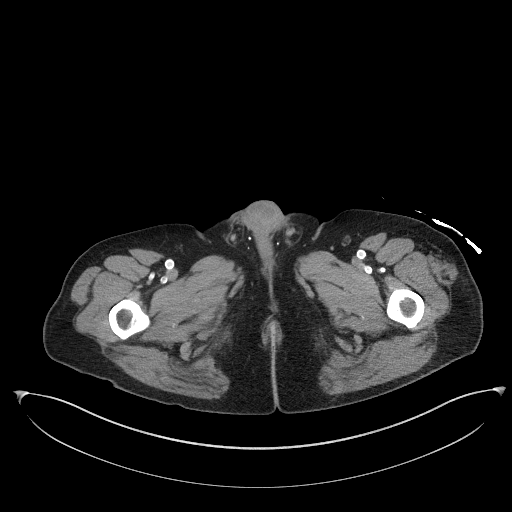
[im 5/104  bone]
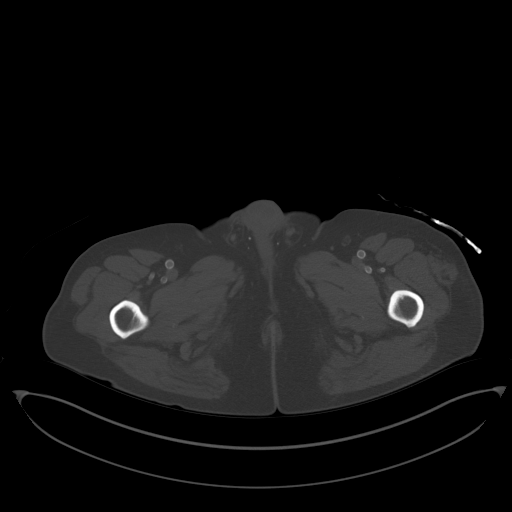
[im 15/104  soft-tissue]
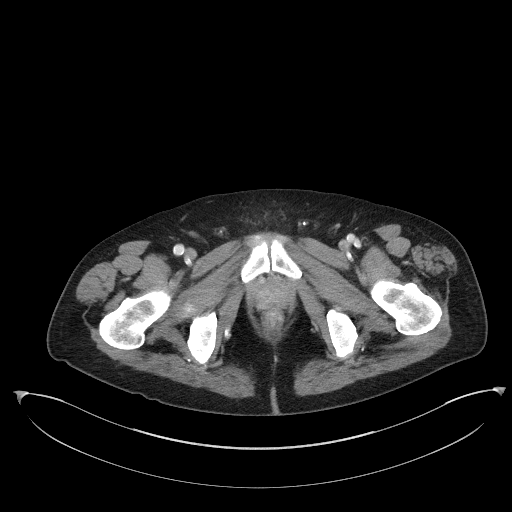
[im 25/104  soft-tissue]
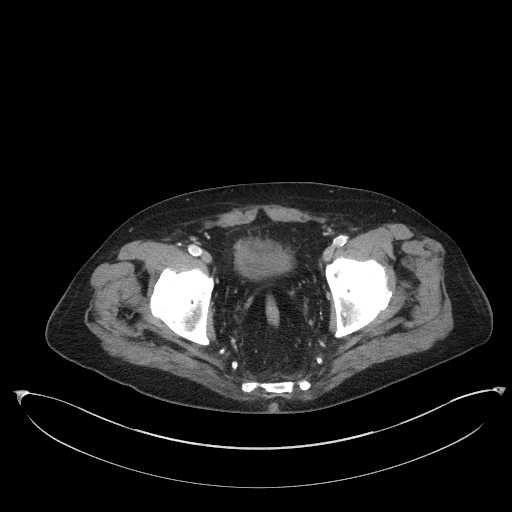
[im 30/104  soft-tissue]
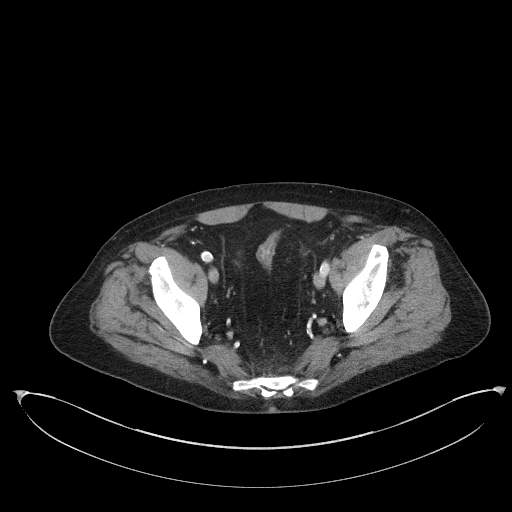
[im 40/104  soft-tissue]
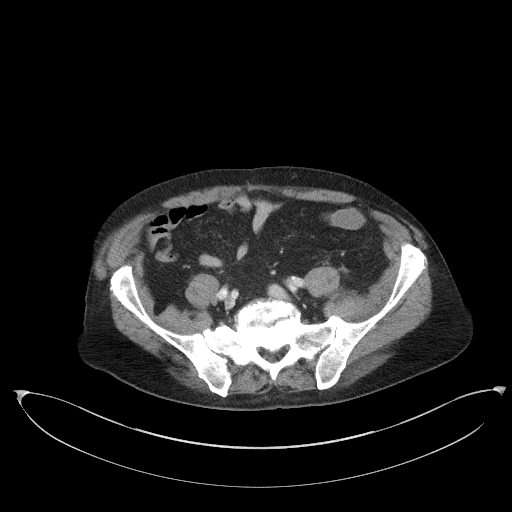
[im 50/104  soft-tissue]
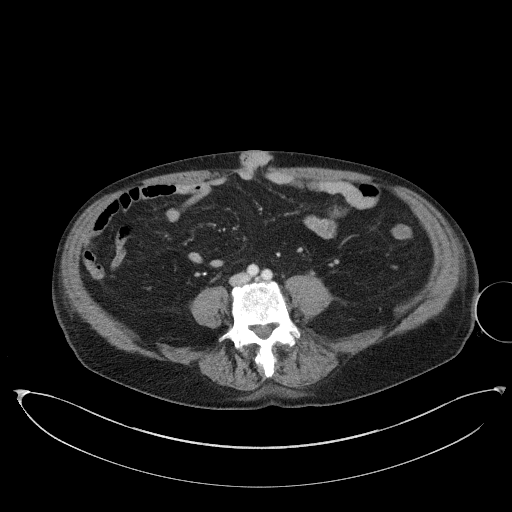
[im 54/104  soft-tissue]
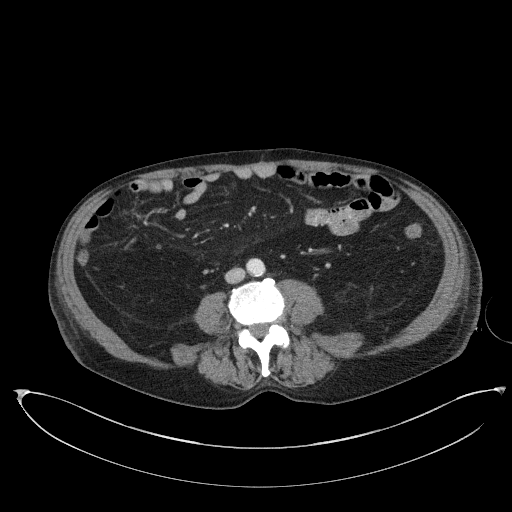
[im 64/104  soft-tissue]
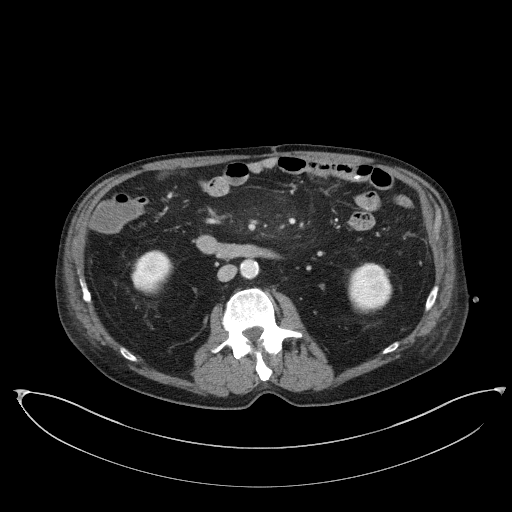
[im 74/104  soft-tissue]
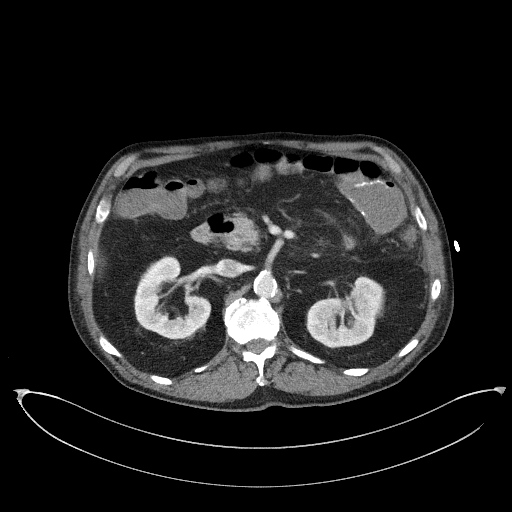
[im 74/104  bone]
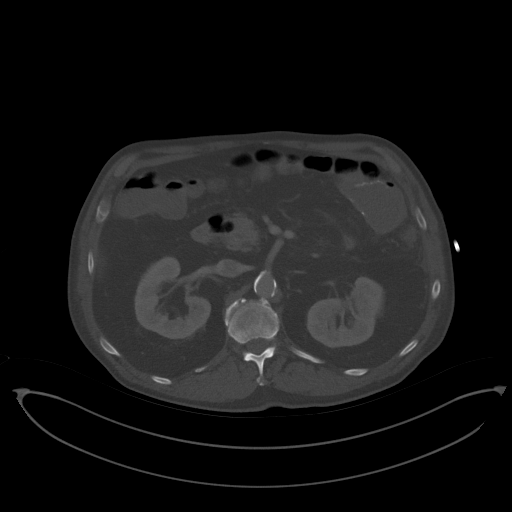
[im 79/104  soft-tissue]
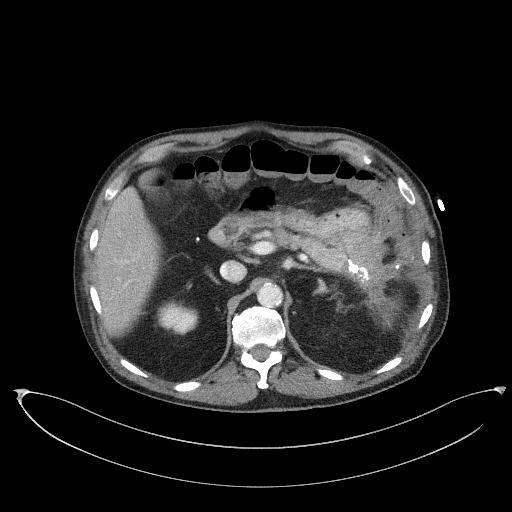
[im 89/104  soft-tissue]
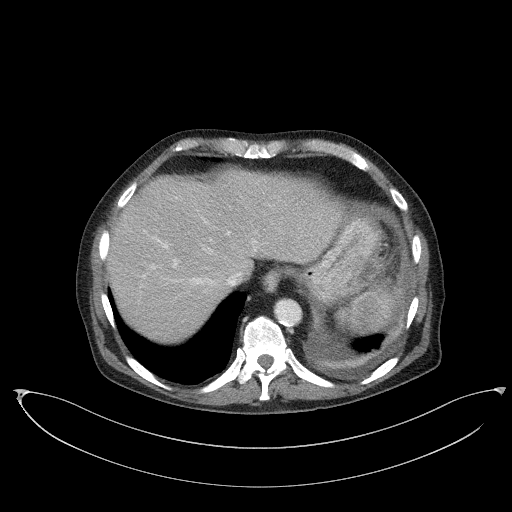
[im 99/104  soft-tissue]
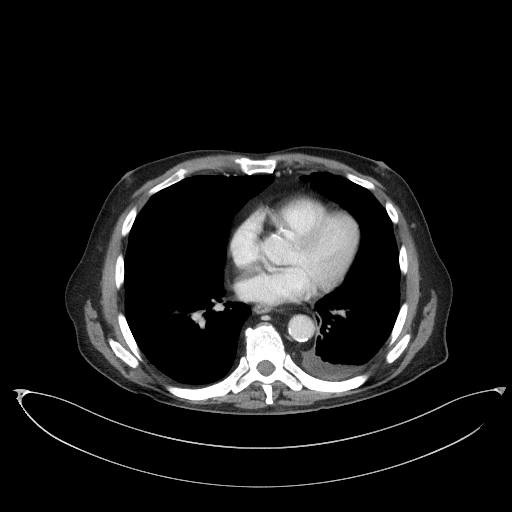

[Series 6: a/p w/ cor · coronal · 0.88mm/px · 3 of 150 slices shown]
[im 50/150  soft-tissue]
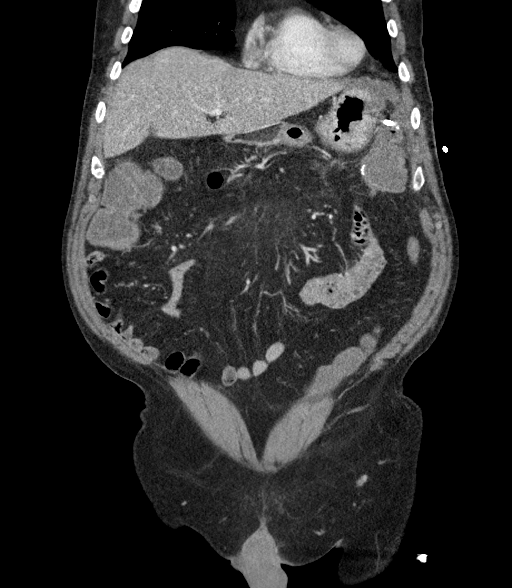
[im 67/150  soft-tissue]
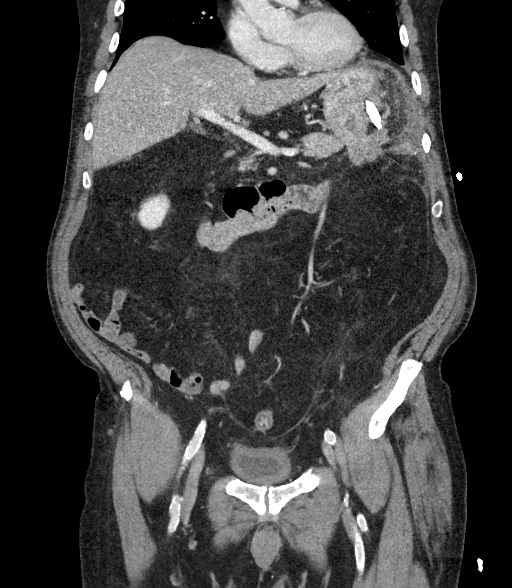
[im 83/150  soft-tissue]
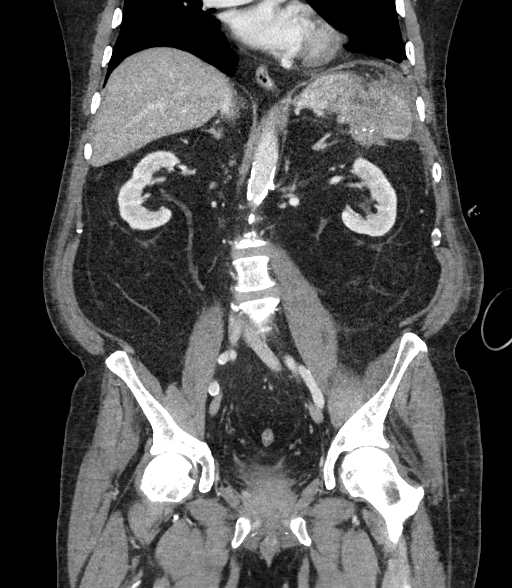

[15 of 46 positions shown; findings below may reference images not displayed]

FINDINGS: Lower chest: Left pleural effusion is improved. Mild dependent
atelectasis is present at both bases. The heart size is.

Hepatobiliary: There is diffuse fatty infiltration of the liver.
Cholecystectomy is noted. Common bile duct is within normal limits.

Pancreas: Pancreas is normal.

Spleen: The spleen is heterogeneous without a dominant lesion.
Previously noted complex fluid collection adjacent to the spleen is
mostly drained. A small component just deep to the drainage catheter
measures 2.1 x 2.1 x 1.4 cm. The more posterior small peripherally
enhancing collection measures 1.9 x 1.2 cm.

Deep to the fluid collection and inferior to the spleen are central
calcifications present at the base of the previously noted mass.
Residual mass lesion measures 2.7 x 4 3 cm.

Adrenals/Urinary Tract: Adrenal glands are normal bilaterally.
Kidneys are within normal limits. There is no stone or mass lesion.
Ureters and urinary bladder are within normal limits.

Stomach/Bowel: The stomach is immediately adjacent to the drained
collection. Duodenum is within normal limits. The small bowel is
normal. There is some stranding of the small bowel mesentery.
Terminal ileum is normal. Appendix is visualized and normal. The
ascending and transverse colon are normal. Partial colectomy is
present. Anastomosis is intact. Distal colon rectum are within
normal limits.

Vascular/Lymphatic: No significant retroperitoneal adenopathy is
present. Atherosclerotic calcifications are present without
aneurysm.

Reproductive: Prostate is somewhat heterogeneous. No dominant lesion
is evident.

Other: Paraumbilical hernia contains a loop bowel without
obstruction.

Musculoskeletal: Degenerative changes are present at L4-5 and L5-S1.
Rightward curvature is centered at L2-3. Pelvis is within normal
limits. Degenerative changes are noted at the SI joints. Hips are
visualized and normal.
IMPRESSION: 1. Near complete drainage of previously noted complex fluid
collection in the left upper quadrant.
2. Two small residual collections remain. Residual soft tissue mass
at the clips concerning for residual recurrent neoplasm.
3. Peritoneal wall thickening also concerning for metastatic
disease.
4. Partial colonic resection and anastomosis is intact.
5. Decreasing left pleural effusion and basilar airspace disease,
likely atelectasis.
6. Degenerative changes of the lower lumbar spine and scoliosis.
